# Patient Record
Sex: Female | Born: 1945
Health system: Southern US, Community
[De-identification: ages and names within clinical notes are randomized; demographics above are authoritative.]

## PROBLEM LIST (undated history)

## (undated) DIAGNOSIS — F329 Major depressive disorder, single episode, unspecified: Secondary | ICD-10-CM

## (undated) DIAGNOSIS — G4730 Sleep apnea, unspecified: Secondary | ICD-10-CM

## (undated) DIAGNOSIS — R011 Cardiac murmur, unspecified: Secondary | ICD-10-CM

## (undated) DIAGNOSIS — K219 Gastro-esophageal reflux disease without esophagitis: Secondary | ICD-10-CM

## (undated) DIAGNOSIS — Z9889 Other specified postprocedural states: Secondary | ICD-10-CM

## (undated) DIAGNOSIS — H409 Unspecified glaucoma: Secondary | ICD-10-CM

## (undated) DIAGNOSIS — R51 Headache: Secondary | ICD-10-CM

## (undated) DIAGNOSIS — D8989 Other specified disorders involving the immune mechanism, not elsewhere classified: Secondary | ICD-10-CM

## (undated) DIAGNOSIS — D649 Anemia, unspecified: Secondary | ICD-10-CM

## (undated) DIAGNOSIS — R112 Nausea with vomiting, unspecified: Secondary | ICD-10-CM

## (undated) DIAGNOSIS — C50919 Malignant neoplasm of unspecified site of unspecified female breast: Secondary | ICD-10-CM

## (undated) DIAGNOSIS — E7800 Pure hypercholesterolemia, unspecified: Secondary | ICD-10-CM

## (undated) DIAGNOSIS — I639 Cerebral infarction, unspecified: Secondary | ICD-10-CM

## (undated) DIAGNOSIS — I10 Essential (primary) hypertension: Secondary | ICD-10-CM

## (undated) DIAGNOSIS — M797 Fibromyalgia: Secondary | ICD-10-CM

## (undated) DIAGNOSIS — K7581 Nonalcoholic steatohepatitis (NASH): Secondary | ICD-10-CM

## (undated) DIAGNOSIS — I341 Nonrheumatic mitral (valve) prolapse: Secondary | ICD-10-CM

## (undated) DIAGNOSIS — R0600 Dyspnea, unspecified: Secondary | ICD-10-CM

## (undated) DIAGNOSIS — C37 Malignant neoplasm of thymus: Secondary | ICD-10-CM

## (undated) DIAGNOSIS — R519 Headache, unspecified: Secondary | ICD-10-CM

## (undated) DIAGNOSIS — J189 Pneumonia, unspecified organism: Secondary | ICD-10-CM

## (undated) DIAGNOSIS — K589 Irritable bowel syndrome without diarrhea: Secondary | ICD-10-CM

## (undated) DIAGNOSIS — F419 Anxiety disorder, unspecified: Secondary | ICD-10-CM

## (undated) DIAGNOSIS — M1990 Unspecified osteoarthritis, unspecified site: Secondary | ICD-10-CM

## (undated) DIAGNOSIS — C50912 Malignant neoplasm of unspecified site of left female breast: Secondary | ICD-10-CM

## (undated) DIAGNOSIS — F32A Depression, unspecified: Secondary | ICD-10-CM

## (undated) HISTORY — PX: BREAST LUMPECTOMY: SHX2

## (undated) HISTORY — DX: Malignant neoplasm of unspecified site of unspecified female breast: C50.919

## (undated) HISTORY — PX: MASTECTOMY: SHX3

## (undated) HISTORY — PX: BREAST BIOPSY: SHX20

---
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
## 1978-10-20 HISTORY — PX: ABDOMINAL HYSTERECTOMY: SHX81

## 2009-11-16 ENCOUNTER — Encounter: Payer: Self-pay | Admitting: Physician Assistant

## 2012-01-14 DIAGNOSIS — H409 Unspecified glaucoma: Secondary | ICD-10-CM | POA: Diagnosis not present

## 2012-01-14 DIAGNOSIS — H4011X0 Primary open-angle glaucoma, stage unspecified: Secondary | ICD-10-CM | POA: Diagnosis not present

## 2012-03-10 ENCOUNTER — Encounter: Payer: Self-pay | Admitting: Physician Assistant

## 2012-03-10 DIAGNOSIS — M359 Systemic involvement of connective tissue, unspecified: Secondary | ICD-10-CM | POA: Diagnosis not present

## 2012-03-10 DIAGNOSIS — M6530 Trigger finger, unspecified finger: Secondary | ICD-10-CM | POA: Diagnosis not present

## 2012-03-10 DIAGNOSIS — M899 Disorder of bone, unspecified: Secondary | ICD-10-CM | POA: Diagnosis not present

## 2012-03-10 DIAGNOSIS — M25549 Pain in joints of unspecified hand: Secondary | ICD-10-CM | POA: Diagnosis not present

## 2012-03-10 DIAGNOSIS — IMO0001 Reserved for inherently not codable concepts without codable children: Secondary | ICD-10-CM | POA: Diagnosis not present

## 2012-07-08 DIAGNOSIS — Z23 Encounter for immunization: Secondary | ICD-10-CM | POA: Diagnosis not present

## 2012-09-08 DIAGNOSIS — M359 Systemic involvement of connective tissue, unspecified: Secondary | ICD-10-CM | POA: Diagnosis not present

## 2012-09-08 DIAGNOSIS — M159 Polyosteoarthritis, unspecified: Secondary | ICD-10-CM | POA: Diagnosis not present

## 2012-09-08 DIAGNOSIS — M25549 Pain in joints of unspecified hand: Secondary | ICD-10-CM | POA: Diagnosis not present

## 2012-09-08 DIAGNOSIS — M659 Synovitis and tenosynovitis, unspecified: Secondary | ICD-10-CM | POA: Diagnosis not present

## 2012-11-03 DIAGNOSIS — E559 Vitamin D deficiency, unspecified: Secondary | ICD-10-CM | POA: Insufficient documentation

## 2012-11-03 DIAGNOSIS — R7989 Other specified abnormal findings of blood chemistry: Secondary | ICD-10-CM | POA: Diagnosis not present

## 2012-11-03 DIAGNOSIS — K5289 Other specified noninfective gastroenteritis and colitis: Secondary | ICD-10-CM | POA: Diagnosis not present

## 2012-11-03 DIAGNOSIS — K589 Irritable bowel syndrome without diarrhea: Secondary | ICD-10-CM | POA: Insufficient documentation

## 2012-11-03 DIAGNOSIS — C50019 Malignant neoplasm of nipple and areola, unspecified female breast: Secondary | ICD-10-CM | POA: Insufficient documentation

## 2012-11-03 DIAGNOSIS — E7800 Pure hypercholesterolemia, unspecified: Secondary | ICD-10-CM | POA: Diagnosis not present

## 2012-11-03 DIAGNOSIS — F32A Depression, unspecified: Secondary | ICD-10-CM | POA: Insufficient documentation

## 2012-11-03 DIAGNOSIS — K52832 Lymphocytic colitis: Secondary | ICD-10-CM | POA: Insufficient documentation

## 2012-11-03 DIAGNOSIS — M797 Fibromyalgia: Secondary | ICD-10-CM | POA: Insufficient documentation

## 2012-11-03 DIAGNOSIS — M1990 Unspecified osteoarthritis, unspecified site: Secondary | ICD-10-CM | POA: Insufficient documentation

## 2012-11-03 DIAGNOSIS — F329 Major depressive disorder, single episode, unspecified: Secondary | ICD-10-CM | POA: Insufficient documentation

## 2012-11-03 DIAGNOSIS — I10 Essential (primary) hypertension: Secondary | ICD-10-CM | POA: Diagnosis not present

## 2012-11-24 DIAGNOSIS — E785 Hyperlipidemia, unspecified: Secondary | ICD-10-CM | POA: Diagnosis not present

## 2012-11-24 DIAGNOSIS — K7689 Other specified diseases of liver: Secondary | ICD-10-CM | POA: Diagnosis not present

## 2012-11-24 DIAGNOSIS — R7989 Other specified abnormal findings of blood chemistry: Secondary | ICD-10-CM | POA: Diagnosis not present

## 2013-03-22 DIAGNOSIS — E785 Hyperlipidemia, unspecified: Secondary | ICD-10-CM | POA: Diagnosis not present

## 2013-03-22 DIAGNOSIS — R7989 Other specified abnormal findings of blood chemistry: Secondary | ICD-10-CM | POA: Diagnosis not present

## 2013-03-23 DIAGNOSIS — H25099 Other age-related incipient cataract, unspecified eye: Secondary | ICD-10-CM | POA: Diagnosis not present

## 2013-03-23 DIAGNOSIS — H4011X0 Primary open-angle glaucoma, stage unspecified: Secondary | ICD-10-CM | POA: Diagnosis not present

## 2013-03-23 DIAGNOSIS — H409 Unspecified glaucoma: Secondary | ICD-10-CM | POA: Diagnosis not present

## 2013-03-31 DIAGNOSIS — Z01419 Encounter for gynecological examination (general) (routine) without abnormal findings: Secondary | ICD-10-CM | POA: Diagnosis not present

## 2013-03-31 DIAGNOSIS — R928 Other abnormal and inconclusive findings on diagnostic imaging of breast: Secondary | ICD-10-CM | POA: Diagnosis not present

## 2013-03-31 DIAGNOSIS — Z9010 Acquired absence of unspecified breast and nipple: Secondary | ICD-10-CM | POA: Diagnosis not present

## 2013-07-27 DIAGNOSIS — Z23 Encounter for immunization: Secondary | ICD-10-CM | POA: Diagnosis not present

## 2013-12-27 DIAGNOSIS — F419 Anxiety disorder, unspecified: Secondary | ICD-10-CM | POA: Insufficient documentation

## 2013-12-27 DIAGNOSIS — E7800 Pure hypercholesterolemia, unspecified: Secondary | ICD-10-CM | POA: Diagnosis not present

## 2013-12-27 DIAGNOSIS — I10 Essential (primary) hypertension: Secondary | ICD-10-CM | POA: Diagnosis present

## 2013-12-27 DIAGNOSIS — E559 Vitamin D deficiency, unspecified: Secondary | ICD-10-CM | POA: Diagnosis not present

## 2013-12-27 DIAGNOSIS — F411 Generalized anxiety disorder: Secondary | ICD-10-CM | POA: Diagnosis not present

## 2013-12-27 DIAGNOSIS — IMO0001 Reserved for inherently not codable concepts without codable children: Secondary | ICD-10-CM | POA: Diagnosis not present

## 2014-08-10 DIAGNOSIS — Z23 Encounter for immunization: Secondary | ICD-10-CM | POA: Diagnosis not present

## 2014-11-29 ENCOUNTER — Encounter (HOSPITAL_COMMUNITY): Payer: Self-pay

## 2014-11-29 ENCOUNTER — Inpatient Hospital Stay (HOSPITAL_COMMUNITY): Payer: Medicare Other

## 2014-11-29 ENCOUNTER — Emergency Department (HOSPITAL_COMMUNITY): Payer: Medicare Other

## 2014-11-29 ENCOUNTER — Other Ambulatory Visit: Payer: Self-pay

## 2014-11-29 ENCOUNTER — Observation Stay (HOSPITAL_COMMUNITY)
Admission: EM | Admit: 2014-11-29 | Discharge: 2014-12-02 | Disposition: A | Discharge status: Home / Self Care | Payer: Medicare Other | Attending: Internal Medicine | Admitting: Internal Medicine

## 2014-11-29 DIAGNOSIS — F419 Anxiety disorder, unspecified: Secondary | ICD-10-CM | POA: Insufficient documentation

## 2014-11-29 DIAGNOSIS — E876 Hypokalemia: Secondary | ICD-10-CM | POA: Diagnosis present

## 2014-11-29 DIAGNOSIS — R0789 Other chest pain: Secondary | ICD-10-CM | POA: Diagnosis not present

## 2014-11-29 DIAGNOSIS — G319 Degenerative disease of nervous system, unspecified: Secondary | ICD-10-CM | POA: Diagnosis not present

## 2014-11-29 DIAGNOSIS — R51 Headache: Secondary | ICD-10-CM | POA: Diagnosis not present

## 2014-11-29 DIAGNOSIS — K219 Gastro-esophageal reflux disease without esophagitis: Secondary | ICD-10-CM

## 2014-11-29 DIAGNOSIS — R110 Nausea: Secondary | ICD-10-CM | POA: Diagnosis not present

## 2014-11-29 DIAGNOSIS — R109 Unspecified abdominal pain: Secondary | ICD-10-CM

## 2014-11-29 DIAGNOSIS — Z79899 Other long term (current) drug therapy: Secondary | ICD-10-CM | POA: Diagnosis not present

## 2014-11-29 DIAGNOSIS — R4781 Slurred speech: Secondary | ICD-10-CM | POA: Diagnosis not present

## 2014-11-29 DIAGNOSIS — K760 Fatty (change of) liver, not elsewhere classified: Secondary | ICD-10-CM | POA: Diagnosis not present

## 2014-11-29 DIAGNOSIS — E785 Hyperlipidemia, unspecified: Secondary | ICD-10-CM | POA: Diagnosis not present

## 2014-11-29 DIAGNOSIS — K589 Irritable bowel syndrome without diarrhea: Secondary | ICD-10-CM | POA: Insufficient documentation

## 2014-11-29 DIAGNOSIS — Z853 Personal history of malignant neoplasm of breast: Secondary | ICD-10-CM | POA: Diagnosis not present

## 2014-11-29 DIAGNOSIS — R079 Chest pain, unspecified: Secondary | ICD-10-CM | POA: Diagnosis present

## 2014-11-29 DIAGNOSIS — M797 Fibromyalgia: Secondary | ICD-10-CM | POA: Insufficient documentation

## 2014-11-29 DIAGNOSIS — I6782 Cerebral ischemia: Secondary | ICD-10-CM | POA: Diagnosis not present

## 2014-11-29 DIAGNOSIS — E780 Pure hypercholesterolemia: Secondary | ICD-10-CM | POA: Insufficient documentation

## 2014-11-29 DIAGNOSIS — F329 Major depressive disorder, single episode, unspecified: Secondary | ICD-10-CM | POA: Diagnosis not present

## 2014-11-29 DIAGNOSIS — I341 Nonrheumatic mitral (valve) prolapse: Secondary | ICD-10-CM | POA: Insufficient documentation

## 2014-11-29 DIAGNOSIS — I10 Essential (primary) hypertension: Secondary | ICD-10-CM | POA: Diagnosis present

## 2014-11-29 DIAGNOSIS — H409 Unspecified glaucoma: Secondary | ICD-10-CM | POA: Diagnosis not present

## 2014-11-29 DIAGNOSIS — R0902 Hypoxemia: Secondary | ICD-10-CM | POA: Diagnosis not present

## 2014-11-29 DIAGNOSIS — G4440 Drug-induced headache, not elsewhere classified, not intractable: Secondary | ICD-10-CM | POA: Diagnosis present

## 2014-11-29 DIAGNOSIS — Z7982 Long term (current) use of aspirin: Secondary | ICD-10-CM | POA: Diagnosis not present

## 2014-11-29 DIAGNOSIS — R100 Acute abdomen: Secondary | ICD-10-CM | POA: Diagnosis not present

## 2014-11-29 DIAGNOSIS — J984 Other disorders of lung: Secondary | ICD-10-CM | POA: Diagnosis not present

## 2014-11-29 HISTORY — DX: Anxiety disorder, unspecified: F41.9

## 2014-11-29 HISTORY — DX: Essential (primary) hypertension: I10

## 2014-11-29 HISTORY — DX: Pure hypercholesterolemia, unspecified: E78.00

## 2014-11-29 HISTORY — DX: Nausea with vomiting, unspecified: Z98.890

## 2014-11-29 HISTORY — DX: Irritable bowel syndrome, unspecified: K58.9

## 2014-11-29 HISTORY — DX: Headache, unspecified: R51.9

## 2014-11-29 HISTORY — DX: Other specified postprocedural states: R11.2

## 2014-11-29 HISTORY — DX: Unspecified osteoarthritis, unspecified site: M19.90

## 2014-11-29 HISTORY — DX: Nonrheumatic mitral (valve) prolapse: I34.1

## 2014-11-29 HISTORY — DX: Headache: R51

## 2014-11-29 HISTORY — DX: Depression, unspecified: F32.A

## 2014-11-29 HISTORY — DX: Other specified disorders involving the immune mechanism, not elsewhere classified: D89.89

## 2014-11-29 HISTORY — DX: Unspecified glaucoma: H40.9

## 2014-11-29 HISTORY — DX: Malignant neoplasm of unspecified site of left female breast: C50.912

## 2014-11-29 HISTORY — DX: Nonalcoholic steatohepatitis (NASH): K75.81

## 2014-11-29 HISTORY — DX: Gastro-esophageal reflux disease without esophagitis: K21.9

## 2014-11-29 HISTORY — DX: Fibromyalgia: M79.7

## 2014-11-29 HISTORY — DX: Major depressive disorder, single episode, unspecified: F32.9

## 2014-11-29 LAB — CBC
HEMATOCRIT: 39.1 % (ref 36.0–46.0)
Hemoglobin: 14 g/dL (ref 12.0–15.0)
MCH: 31 pg (ref 26.0–34.0)
MCHC: 35.8 g/dL (ref 30.0–36.0)
MCV: 86.7 fL (ref 78.0–100.0)
PLATELETS: 266 10*3/uL (ref 150–400)
RBC: 4.51 MIL/uL (ref 3.87–5.11)
RDW: 12.4 % (ref 11.5–15.5)
WBC: 7 10*3/uL (ref 4.0–10.5)

## 2014-11-29 LAB — BASIC METABOLIC PANEL
Anion gap: 13 (ref 5–15)
BUN: 12 mg/dL (ref 6–23)
CO2: 28 mmol/L (ref 19–32)
Calcium: 9.6 mg/dL (ref 8.4–10.5)
Chloride: 95 mmol/L — ABNORMAL LOW (ref 96–112)
Creatinine, Ser: 0.86 mg/dL (ref 0.50–1.10)
GFR calc Af Amer: 78 mL/min — ABNORMAL LOW (ref >90)
GFR calc non Af Amer: 67 mL/min — ABNORMAL LOW (ref >90)
GLUCOSE: 125 mg/dL — AB (ref 70–99)
POTASSIUM: 2.8 mmol/L — AB (ref 3.5–5.1)
Sodium: 136 mmol/L (ref 135–145)

## 2014-11-29 LAB — LIPASE, BLOOD: LIPASE: 26 U/L (ref 11–59)

## 2014-11-29 LAB — I-STAT TROPONIN, ED: Troponin i, poc: 0 ng/mL (ref 0.00–0.08)

## 2014-11-29 LAB — HEPATIC FUNCTION PANEL
ALT: 50 U/L — AB (ref 0–35)
AST: 45 U/L — AB (ref 0–37)
Albumin: 4.1 g/dL (ref 3.5–5.2)
Alkaline Phosphatase: 120 U/L — ABNORMAL HIGH (ref 39–117)
BILIRUBIN TOTAL: 0.7 mg/dL (ref 0.3–1.2)
Bilirubin, Direct: 0.1 mg/dL (ref 0.0–0.5)
Indirect Bilirubin: 0.6 mg/dL (ref 0.3–0.9)
Total Protein: 7.1 g/dL (ref 6.0–8.3)

## 2014-11-29 LAB — SEDIMENTATION RATE: Sed Rate: 16 mm/hr (ref 0–22)

## 2014-11-29 LAB — D-DIMER, QUANTITATIVE: D-Dimer, Quant: 0.35 ug/mL-FEU (ref 0.00–0.48)

## 2014-11-29 LAB — MRSA PCR SCREENING: MRSA BY PCR: NEGATIVE

## 2014-11-29 MED ORDER — GI COCKTAIL ~~LOC~~
30.0000 mL | Freq: Once | ORAL | Status: AC
  Administered 2014-11-29: 30 mL via ORAL
  Filled 2014-11-29: qty 30

## 2014-11-29 MED ORDER — ASPIRIN EC 325 MG PO TBEC
325.0000 mg | DELAYED_RELEASE_TABLET | Freq: Every day | ORAL | Status: DC
  Administered 2014-11-30 – 2014-12-02 (×3): 325 mg via ORAL
  Filled 2014-11-29 (×3): qty 1

## 2014-11-29 MED ORDER — POTASSIUM CHLORIDE CRYS ER 20 MEQ PO TBCR
40.0000 meq | EXTENDED_RELEASE_TABLET | Freq: Once | ORAL | Status: AC
  Administered 2014-11-29: 40 meq via ORAL
  Filled 2014-11-29: qty 2

## 2014-11-29 MED ORDER — MORPHINE SULFATE 2 MG/ML IJ SOLN: 2.0000 mg | INTRAMUSCULAR | Status: DC | PRN

## 2014-11-29 MED ORDER — ENOXAPARIN SODIUM 30 MG/0.3ML ~~LOC~~ SOLN
30.0000 mg | Freq: Every day | SUBCUTANEOUS | Status: DC
  Administered 2014-11-29: 30 mg via SUBCUTANEOUS
  Filled 2014-11-29 (×2): qty 0.3

## 2014-11-29 MED ORDER — PROCHLORPERAZINE EDISYLATE 5 MG/ML IJ SOLN
10.0000 mg | Freq: Once | INTRAMUSCULAR | Status: AC
  Administered 2014-11-29: 10 mg via INTRAVENOUS
  Filled 2014-11-29: qty 2

## 2014-11-29 MED ORDER — DEXAMETHASONE SODIUM PHOSPHATE 10 MG/ML IJ SOLN
10.0000 mg | Freq: Once | INTRAMUSCULAR | Status: AC
  Administered 2014-11-29: 10 mg via INTRAVENOUS
  Filled 2014-11-29: qty 1

## 2014-11-29 MED ORDER — PAROXETINE HCL 30 MG PO TABS
60.0000 mg | ORAL_TABLET | Freq: Every day | ORAL | Status: DC
  Administered 2014-11-29 – 2014-12-01 (×3): 60 mg via ORAL
  Filled 2014-11-29 (×4): qty 2

## 2014-11-29 MED ORDER — DIPHENHYDRAMINE HCL 50 MG/ML IJ SOLN
12.5000 mg | Freq: Once | INTRAMUSCULAR | Status: AC
  Administered 2014-11-29: 12.5 mg via INTRAVENOUS
  Filled 2014-11-29: qty 1

## 2014-11-29 MED ORDER — NITROGLYCERIN 0.4 MG SL SUBL
0.4000 mg | SUBLINGUAL_TABLET | SUBLINGUAL | Status: DC | PRN
  Filled 2014-11-29: qty 1

## 2014-11-29 MED ORDER — ACETAMINOPHEN 325 MG PO TABS
650.0000 mg | ORAL_TABLET | ORAL | Status: DC | PRN
  Administered 2014-11-30: 650 mg via ORAL
  Filled 2014-11-29: qty 2

## 2014-11-29 MED ORDER — ONDANSETRON HCL 4 MG/2ML IJ SOLN: 4.0000 mg | Freq: Four times a day (QID) | INTRAMUSCULAR | Status: DC | PRN

## 2014-11-29 MED ORDER — CYCLOBENZAPRINE HCL 10 MG PO TABS
10.0000 mg | ORAL_TABLET | Freq: Three times a day (TID) | ORAL | Status: DC | PRN
  Administered 2014-12-01: 18:00:00 10 mg via ORAL
  Filled 2014-11-29: qty 1

## 2014-11-29 MED ORDER — MAGNESIUM SULFATE 2 GM/50ML IV SOLN
2.0000 g | Freq: Once | INTRAVENOUS | Status: AC
  Administered 2014-11-29: 2 g via INTRAVENOUS
  Filled 2014-11-29: qty 50

## 2014-11-29 MED ORDER — SODIUM CHLORIDE 0.9 % IV BOLUS (SEPSIS)
1000.0000 mL | Freq: Once | INTRAVENOUS | Status: AC
  Administered 2014-11-29: 1000 mL via INTRAVENOUS

## 2014-11-29 MED ORDER — ATORVASTATIN CALCIUM 40 MG PO TABS
40.0000 mg | ORAL_TABLET | Freq: Every day | ORAL | Status: DC
  Administered 2014-11-29 – 2014-12-01 (×3): 40 mg via ORAL
  Filled 2014-11-29 (×4): qty 1

## 2014-11-29 MED ORDER — TRAMADOL HCL 50 MG PO TABS
50.0000 mg | ORAL_TABLET | ORAL | Status: DC | PRN
  Administered 2014-11-30 – 2014-12-01 (×5): 50 mg via ORAL
  Filled 2014-11-29 (×5): qty 1

## 2014-11-29 MED ORDER — ASPIRIN 81 MG PO CHEW
324.0000 mg | CHEWABLE_TABLET | Freq: Once | ORAL | Status: AC
  Administered 2014-11-29: 324 mg via ORAL
  Filled 2014-11-29: qty 4

## 2014-11-29 MED ORDER — POTASSIUM CHLORIDE IN NACL 20-0.9 MEQ/L-% IV SOLN
INTRAVENOUS | Status: AC
  Administered 2014-11-29 – 2014-11-30 (×2): via INTRAVENOUS
  Filled 2014-11-29 (×2): qty 1000

## 2014-11-29 MED ORDER — HYDRALAZINE HCL 20 MG/ML IJ SOLN: 10.0000 mg | INTRAMUSCULAR | Status: DC | PRN

## 2014-11-29 NOTE — ED Notes (Signed)
Pt will return to Ed after [place] for RN to take up to [facility]

## 2014-11-29 NOTE — ED Provider Notes (Signed)
Pt presents to the ED with complaints of headache, chest pain and htn.  Chest pain has been waxing and waning.  Moderate risk per heart score.  Family also mentions patient has been having some issues with her balance and speech.    Will plan on admission for further evaluation regarding ACS, stroke.  I saw and evaluated the patient, reviewed the resident's note and I agree with the findings and plan.   EKG Interpretation   Date/Time:  Wednesday November 29 2014 15:59:06 EST Ventricular Rate:  96 PR Interval:  150 QRS Duration: 92 QT Interval:  402 QTC Calculation: 507 R Axis:   57 Text Interpretation:  Normal sinus rhythm Incomplete right bundle branch  block Nonspecific ST abnormality Prolonged QT Abnormal ECG No previous  tracing Confirmed by Jawara Latorre  MD-J, Sheikh Leverich ([id number]) on 11/29/2014 4:28:39 PM         Dorie Rank, MD 11/29/14 1719

## 2014-11-29 NOTE — Progress Notes (Signed)
Patient just arrived to unit 6c @2230  received report @2010 .

## 2014-11-29 NOTE — ED Notes (Signed)
Pt's family member reporting pt having delayed responses lately.  Sts this is not the norm for the pt.

## 2014-11-29 NOTE — ED Provider Notes (Signed)
CSN: [id number]     Arrival date & time 11/29/14  1553 History   First MD Initiated Contact with Patient 11/29/14 1614     Chief Complaint  Patient presents with  . Chest Pain  . Headache  . Hypertension     (Consider location/radiation/quality/duration/timing/severity/associated sxs/prior Treatment) HPI Autumn Conley is a 69 y.o. female with PMH as below who presents  to ED with c/o HA, CP, hypertension.   Sxs started with HA 2 wks ago. Pt says she typically gets 2-3 tension HA weekly. Occasional migraine HAs. HA today has been constant for past 2 wks and is primarily frontal. Wakes up with HA and goes to bed with HA. OTC meds provide minimal relief. Pt also began having substernal chest pressure which has been constant for about a week, but waxes/wanes, never completely goes away. No modifying factors for either HA or CP. Pt has also been checking BP which has been up, into 007H systolic. Went to UC today and they rec'd go to ED. Pain is characterized as pressure.   Onset of symptoms: gradual.    Severity: 5/10.   Associated symptoms: Indigestion, nausea. Denies SOB, diaphoresis, leg pain, recent long trip, vomiting. No recent f/c, cough.   Hx of similar symptoms: no.     Past Medical History  Diagnosis Date  . Hypercholesteremia   . Hypertension   . Glaucoma   . IBS (irritable bowel syndrome)   . Cancer     breast cancer   Past Surgical History  Procedure Laterality Date  . Mastectomy    . Appendectomy    . Abdominal hysterectomy     History reviewed. No pertinent family history. History  Substance Use Topics  . Smoking status: Never Smoker   . Smokeless tobacco: Not on file  . Alcohol Use: No   OB History    No data available     Review of Systems  Constitutional: Negative for fever and chills.  HENT: Negative for congestion, rhinorrhea and sore throat.   Eyes: Negative for visual disturbance.  Respiratory: Negative for cough and shortness of breath.    Cardiovascular: Positive for chest pain. Negative for palpitations and leg swelling.  Gastrointestinal: Positive for nausea. Negative for vomiting, abdominal pain, diarrhea and constipation.  Genitourinary: Negative for dysuria, hematuria, vaginal bleeding and vaginal discharge.  Musculoskeletal: Negative for back pain and neck pain.  Skin: Negative for rash.  Neurological: Positive for headaches. Negative for weakness.  All other systems reviewed and are negative.    Allergies  Review of patient's allergies indicates not on file.  Home Medications   Prior to Admission medications   Not on File   BP 181/99 mmHg  Pulse 92  Temp(Src) 97.9 F (36.6 C) (Oral)  Resp 20  SpO2 95% Physical Exam  Constitutional: She is oriented to person, place, and time. She appears well-developed and well-nourished. No distress.  HENT:  Head: Normocephalic and atraumatic.  Eyes: Conjunctivae are normal.  Neck: Normal range of motion.  Cardiovascular: Normal rate, regular rhythm, normal heart sounds and intact distal pulses.   No murmur heard. Pulmonary/Chest: Effort normal and breath sounds normal. No respiratory distress. She has no wheezes. She has no rales. She exhibits no tenderness.  Abdominal: Soft. Bowel sounds are normal. She exhibits no distension and no mass. There is no tenderness. There is no rebound and no guarding.  Musculoskeletal: Normal range of motion. She exhibits no edema (BL lower extremity) or tenderness.  Neurological: She is alert and  oriented to person, place, and time. No cranial nerve deficit.  Skin: Skin is warm and dry. She is not diaphoretic.  Psychiatric: She has a normal mood and affect.  Nursing note and vitals reviewed.   ED Course  Procedures (including critical care time) Labs Review Labs Reviewed  BASIC METABOLIC PANEL - Abnormal; Notable for the following:    Potassium 2.8 (*)    Chloride 95 (*)    Glucose, Bld 125 (*)    GFR calc non Af Amer 67 (*)     GFR calc Af Amer 78 (*)    All other components within normal limits  CBC  I-STAT TROPOININ, ED    Imaging Review Ct Head Wo Contrast  11/29/2014   CLINICAL DATA:  Headaches for 1 week. Nausea and chest pain. No injury.  EXAM: CT HEAD WITHOUT CONTRAST  TECHNIQUE: Contiguous axial images were obtained from the base of the skull through the vertex without intravenous contrast.  COMPARISON:  None.  FINDINGS: Diffuse cerebral atrophy. Patchy low-attenuation changes in the deep white matter consistent with small vessel ischemia. No mass effect or midline shift. No abnormal extra-axial fluid collections. Gray-white matter junctions are distinct. Basal cisterns are not effaced. No evidence of acute intracranial hemorrhage. No depressed skull fractures. Small retention cyst in the left maxillary antrum. Mastoid air cells are not opacified.  IMPRESSION: No acute intracranial abnormalities. Mild chronic atrophy and small vessel ischemic changes.   Electronically Signed   By: Lucienne Capers M.D.   On: 11/29/2014 18:31   US Abdomen Complete  11/29/2014   CLINICAL DATA:  Acute onset of generalized abdominal pain. Initial encounter.  EXAM: ULTRASOUND ABDOMEN COMPLETE  COMPARISON:  None.  FINDINGS: Gallbladder: No gallstones or wall thickening visualized. No sonographic Murphy sign noted.  Common bile duct: Diameter: 0.5 cm, within normal limits in caliber.  Liver: No focal lesion identified. Mildly increased parenchymal echogenicity and coarsened echotexture, likely reflecting fatty infiltration.  IVC: No abnormality visualized.  Pancreas: Not visualized due to overlying bowel gas.  Spleen: Size and appearance within normal limits.  Right Kidney: Length: 11.5 cm. Echogenicity within normal limits. No mass or hydronephrosis visualized.  Left Kidney: Length: 11.5 cm. Echogenicity within normal limits. No mass or hydronephrosis visualized.  Abdominal aorta: No aneurysm visualized.  Other findings: None.  IMPRESSION:  1. No acute abnormality seen within the abdomen. Pancreas not visualized due to overlying bowel gas. 2. Fatty infiltration within the liver.   Electronically Signed   By: Garald Balding M.D.   On: 11/29/2014 22:19   Dg Chest Port 1 View  11/29/2014   CLINICAL DATA:  Mid chest pain radiating to right jaw and arm, headache and hypertension.  EXAM: PORTABLE CHEST - 1 VIEW  COMPARISON:  None.  FINDINGS: The heart size and mediastinal contours are within normal limits. Minimal scarring/ atelectasis at both lung bases. There is no evidence of pulmonary edema, consolidation, pneumothorax, nodule or pleural fluid. The visualized skeletal structures are unremarkable.  IMPRESSION: No active disease.   Electronically Signed   By: Aletta Edouard M.D.   On: 11/29/2014 18:03     EKG Interpretation   Date/Time:  Wednesday November 29 2014 15:59:06 EST Ventricular Rate:  96 PR Interval:  150 QRS Duration: 92 QT Interval:  402 QTC Calculation: 507 R Axis:   57 Text Interpretation:  Normal sinus rhythm Incomplete right bundle branch  block Nonspecific ST abnormality Prolonged QT Abnormal ECG No previous  tracing Confirmed by KNAPP  MD-J,  JON [phone]) on 11/29/2014 4:28:39 PM      MDM   Final diagnoses:  None    Hadyn Azer is a 69 y.o. female who p/w CP. ABCs intact. HDS, NAD. Cardiac w/u initiated. ASA given in ED.  EKG personally review by myself and showed: NSR, incomplete RBBB. Slight ST dep in lateral leads. No STE.  HEART score 4. Previous w/u: none  Labs notable for undetectable initial Tn  Pain does not radiate to back, blood pressure is stable. No mediastinal widening on CXR. Dissection unlikely. Pt's breath sounds are equal bilaterally. No PTX on CXR. PTX unlikely. Pain is not associated with meals. GERD unlikely. Pt has Wells score of 0; PE unlikely. Pain is not positional and EKG is WNL. Pericarditis unlikely. No h/o cocaine use. Pt reports no cough or fever. No infiltrate on CXR. PNA  unlikely. No Hx of exertion, trauma. Pain is non-reproducible. Costochondritis unlikely.  This pt is in nad, afvss, non-toxic appearing.  HA onset was slow, not quick or thunderclap, doubt ich.  Pt has no focal neuro sx, neuro exam is wnl, no visual disturbance, no dizziness/lightheadedness and HA is described as typical HA, doubt intracranial abnormality (aneurysm or mass) and vertebral artery or carotid artery dissection.  No infectious sx, no meningismus, afebrile, no ams, doubt meningitis.  No sinus ttp, doubt sinusitis.  There is nothing on hx or exam to give me c/f dental or ear etiology.  No tenderness over temporal artery, doubt temporal arteritis.  No tearing or eye pain, doubt cluster HA.  Nothing in hx to concern me for CO poisoning.  No hyperesthesia or rash to concern me for zoster.  The pt received the following treatments:  Medications  aspirin chewable tablet 324 mg (324 mg Oral Given 11/29/14 1632)  sodium chloride 0.9 % bolus 1,000 mL (1,000 mLs Intravenous New Bag/Given 11/29/14 1651)  prochlorperazine (COMPAZINE) injection 10 mg (10 mg Intravenous Given 11/29/14 1648)  diphenhydrAMINE (BENADRYL) injection 12.5 mg (12.5 mg Intravenous Given 11/29/14 1648)  dexamethasone (DECADRON) injection 10 mg (10 mg Intravenous Given 11/29/14 1647)   HCT neg.  Following treatment pt's symptoms improved  Will admit to hospitalist for further mgmt.  Pt seen in conjunction with Dr. Alba Destine, [place] [facility] Resident - PGY-2     Kirstie Peri, MD 11/30/14 3888  Dorie Rank, MD 11/30/14 Joen Laura

## 2014-11-29 NOTE — ED Notes (Signed)
Pt with chest pain and headache that has been present for about a week.  Pain radiates to neck and jaw.  Pt was seen at [facility] for evaluation of symptoms and told to come to this ED.  Pt takes HCTZ at home.  Pt hypertensive at [facility] 170/110; at home it was 180/99. Pt also reported abdominal pain.

## 2014-11-29 NOTE — H&P (Signed)
[facility] History and Physical  Autumn Conley XBM:[id number] DOB: December 02, 1945 DOA: 11/29/2014  Referring physician: ER physician. PCP: No primary care provider on file.  Chief Complaint: Headache and chest pain.  HPI: Autumn Conley is a 69 y.o. female with history of hypertension, hyperlipidemia and chronic headaches presents to the ER because of headache and chest pain. Patient has been having chest pain over the last 1 week which has been persistent retrosternal nonradiating pressure-like squeezing type associated with some nausea denies any vomiting patient also has been having some epigastric discomfort. In addition patient has been having frontal headache more than usual for which usually patient takes tramadol. Denies any visual symptoms focal deficits walls of consciousness or any visual symptoms. CT head did not show anything acute. Patient states over the last few days patient's blood pressure has been running high and in the ER patient had systolic around 027O. Patient still has mild chest pain and will be admitted for further management. Patient's cardiac markers have been negative EKG shows minimal ST depression in the inferolateral leads.   Review of Systems: As presented in the history of presenting illness, rest negative.  Past Medical History  Diagnosis Date  . Hypercholesteremia   . Hypertension   . Glaucoma   . IBS (irritable bowel syndrome)   . Cancer     breast cancer   Past Surgical History  Procedure Laterality Date  . Mastectomy    . Appendectomy    . Abdominal hysterectomy     Social History:  reports that she has never smoked. She does not have any smokeless tobacco history on file. She reports that she does not drink alcohol. Her drug history is not on file. Where does patient live home. Can patient participate in ADLs? Yes.  No Known Allergies  Family History:  Family History  Problem Relation Age of Onset  . Family history unknown: Yes       Prior to Admission medications   Medication Sig Start Date End Date Taking? Authorizing Provider  aspirin EC 81 MG tablet Take 81 mg by mouth daily.   Yes Historical Provider, MD  Aspirin-Acetaminophen-Caffeine (GOODY HEADACHE PO) Take 1 packet by mouth daily as needed (headache / pain).   Yes Historical Provider, MD  atorvastatin (LIPITOR) 40 MG tablet Take 40 mg by mouth at bedtime.   Yes Historical Provider, MD  Cholecalciferol (DIALYVITE VITAMIN D 5000 PO) Take 1 capsule by mouth daily.   Yes Historical Provider, MD  cyclobenzaprine (FLEXERIL) 10 MG tablet Take 10 mg by mouth 3 (three) times daily as needed for muscle spasms.   Yes Historical Provider, MD  hydrochlorothiazide (HYDRODIURIL) 25 MG tablet Take 12.5 mg by mouth daily.   Yes Historical Provider, MD  PARoxetine (PAXIL) 20 MG tablet Take 60 mg by mouth at bedtime.   Yes Historical Provider, MD  traMADol (ULTRAM) 50 MG tablet Take 50 mg by mouth every 4 (four) hours as needed for moderate pain.   Yes Historical Provider, MD  vitamin E 100 UNIT capsule Take 400 Units by mouth daily.   Yes Historical Provider, MD    Physical Exam: Filed Vitals:   11/29/14 1915 11/29/14 1930 11/29/14 2000 11/29/14 2118  BP: 155/90 161/90  152/93  Pulse: 89 93  90  Temp:    98.1 F (36.7 C)  TempSrc:    Oral  Resp: 18 16  15   Height:   5' 4"  (1.626 m)   Weight:   74.39 kg (164 lb)  SpO2: 94% 90%  93%     General:  Well-developed and nourished.  Eyes: Anicteric no pallor.  ENT: No discharge from the ears eyes nose or mouth.  Neck: No mass felt. No neck rigidity.  Cardiovascular: S1-S2 heard.  Respiratory: No rhonchi or crepitations.  Abdomen: Soft nontender bowel sounds present.  Skin: No rash.  Musculoskeletal: No edema.  Psychiatric: Appears normal.  Neurologic: Alert awake oriented to time place and person. Moves all extremities.  Labs on Admission:  Basic Metabolic Panel:  Recent Labs Lab 11/29/14 1608  NA 136   K 2.8*  CL 95*  CO2 28  GLUCOSE 125*  BUN 12  CREATININE 0.86  CALCIUM 9.6   Liver Function Tests: No results for input(s): AST, ALT, ALKPHOS, BILITOT, PROT, ALBUMIN in the last 168 hours. No results for input(s): LIPASE, AMYLASE in the last 168 hours. No results for input(s): AMMONIA in the last 168 hours. CBC:  Recent Labs Lab 11/29/14 1608  WBC 7.0  HGB 14.0  HCT 39.1  MCV 86.7  PLT 266   Cardiac Enzymes: No results for input(s): CKTOTAL, CKMB, CKMBINDEX, TROPONINI in the last 168 hours.  BNP (last 3 results) No results for input(s): BNP in the last 8760 hours.  ProBNP (last 3 results) No results for input(s): PROBNP in the last 8760 hours.  CBG: No results for input(s): GLUCAP in the last 168 hours.  Radiological Exams on Admission: Ct Head Wo Contrast  11/29/2014   CLINICAL DATA:  Headaches for 1 week. Nausea and chest pain. No injury.  EXAM: CT HEAD WITHOUT CONTRAST  TECHNIQUE: Contiguous axial images were obtained from the base of the skull through the vertex without intravenous contrast.  COMPARISON:  None.  FINDINGS: Diffuse cerebral atrophy. Patchy low-attenuation changes in the deep white matter consistent with small vessel ischemia. No mass effect or midline shift. No abnormal extra-axial fluid collections. Gray-white matter junctions are distinct. Basal cisterns are not effaced. No evidence of acute intracranial hemorrhage. No depressed skull fractures. Small retention cyst in the left maxillary antrum. Mastoid air cells are not opacified.  IMPRESSION: No acute intracranial abnormalities. Mild chronic atrophy and small vessel ischemic changes.   Electronically Signed   By: Lucienne Capers M.D.   On: 11/29/2014 18:31   Dg Chest Port 1 View  11/29/2014   CLINICAL DATA:  Mid chest pain radiating to right jaw and arm, headache and hypertension.  EXAM: PORTABLE CHEST - 1 VIEW  COMPARISON:  None.  FINDINGS: The heart size and mediastinal contours are within normal  limits. Minimal scarring/ atelectasis at both lung bases. There is no evidence of pulmonary edema, consolidation, pneumothorax, nodule or pleural fluid. The visualized skeletal structures are unremarkable.  IMPRESSION: No active disease.   Electronically Signed   By: Aletta Edouard M.D.   On: 11/29/2014 18:03    EKG: Independently reviewed. Normal sinus rhythm with minimal ST T changes in inferolateral leads.  Assessment/Plan Principal Problem:   Chest pain Active Problems:   Hypokalemia   Headache   Hypertension   Hyperlipidemia   1. Chest pain - given the history of hypertension hyperlipidemia we will cycle cardiac markers to rule out ACS. Patient does have some ST-T changes in the inferolateral leads. Check 2-D echo. Will keep patient nothing by mouth in a.m. in anticipation of cardiac procedures. Check d-dimer. Since patient also has been having some nonspecific abdominal discomfort we'll check LFTs and sonogram of the abdomen to rule out any gallbladder pathology. 2. Hypokalemia -  may be related to hydrochlorothiazide. Replace and recheck and check magnesium levels. 3. Hypertension - we will hold her hydrochlorothiazide for now due to hypokalemia and I have placed patient on when necessary IV hydralazine. Based on blood pressure trends we may need to add medications. 4. Headache - patient has chronic headaches and takes tramadol. Patient states the headache has worsened from usual. Patient is nonfocal and CT head is negative for anything acute. At this time we will check sedimentation rate and closely observe and keep patient on as needed pain medications. 5. Hyperlipidemia - continue present medications.   DVT Prophylaxis Lovenox.  Code Status: Full code.  Family Communication: Daughter at the bedside.  Disposition Plan: Admit for observation.    KAKRAKANDY,ARSHAD N. [facility] Pager [phone].  If 7PM-7AM, please contact night-coverage [email or web address] Password  [facility] 11/29/2014, 9:34 PM

## 2014-11-30 ENCOUNTER — Inpatient Hospital Stay (HOSPITAL_COMMUNITY): Payer: Medicare Other

## 2014-11-30 DIAGNOSIS — G4452 New daily persistent headache (NDPH): Secondary | ICD-10-CM | POA: Diagnosis not present

## 2014-11-30 DIAGNOSIS — I10 Essential (primary) hypertension: Secondary | ICD-10-CM | POA: Diagnosis not present

## 2014-11-30 DIAGNOSIS — E876 Hypokalemia: Secondary | ICD-10-CM | POA: Diagnosis not present

## 2014-11-30 DIAGNOSIS — G44209 Tension-type headache, unspecified, not intractable: Secondary | ICD-10-CM | POA: Diagnosis not present

## 2014-11-30 DIAGNOSIS — R51 Headache: Secondary | ICD-10-CM | POA: Diagnosis not present

## 2014-11-30 DIAGNOSIS — E785 Hyperlipidemia, unspecified: Secondary | ICD-10-CM | POA: Diagnosis not present

## 2014-11-30 DIAGNOSIS — R072 Precordial pain: Secondary | ICD-10-CM

## 2014-11-30 DIAGNOSIS — R079 Chest pain, unspecified: Secondary | ICD-10-CM

## 2014-11-30 LAB — CBC WITH DIFFERENTIAL/PLATELET
Basophils Absolute: 0 10*3/uL (ref 0.0–0.1)
Basophils Relative: 0 % (ref 0–1)
EOS PCT: 0 % (ref 0–5)
Eosinophils Absolute: 0 10*3/uL (ref 0.0–0.7)
HEMATOCRIT: 34.3 % — AB (ref 36.0–46.0)
HEMOGLOBIN: 12 g/dL (ref 12.0–15.0)
LYMPHS ABS: 0.9 10*3/uL (ref 0.7–4.0)
LYMPHS PCT: 12 % (ref 12–46)
MCH: 31 pg (ref 26.0–34.0)
MCHC: 35 g/dL (ref 30.0–36.0)
MCV: 88.6 fL (ref 78.0–100.0)
MONO ABS: 0.6 10*3/uL (ref 0.1–1.0)
MONOS PCT: 8 % (ref 3–12)
Neutro Abs: 5.7 10*3/uL (ref 1.7–7.7)
Neutrophils Relative %: 80 % — ABNORMAL HIGH (ref 43–77)
Platelets: 224 10*3/uL (ref 150–400)
RBC: 3.87 MIL/uL (ref 3.87–5.11)
RDW: 12.4 % (ref 11.5–15.5)
WBC: 7.2 10*3/uL (ref 4.0–10.5)

## 2014-11-30 LAB — COMPREHENSIVE METABOLIC PANEL
ALT: 42 U/L — ABNORMAL HIGH (ref 0–35)
AST: 32 U/L (ref 0–37)
Albumin: 3.6 g/dL (ref 3.5–5.2)
Alkaline Phosphatase: 96 U/L (ref 39–117)
Anion gap: 3 — ABNORMAL LOW (ref 5–15)
BUN: 14 mg/dL (ref 6–23)
CHLORIDE: 105 mmol/L (ref 96–112)
CO2: 29 mmol/L (ref 19–32)
Calcium: 8.9 mg/dL (ref 8.4–10.5)
Creatinine, Ser: 0.77 mg/dL (ref 0.50–1.10)
GFR, EST NON AFRICAN AMERICAN: 84 mL/min — AB (ref >90)
Glucose, Bld: 128 mg/dL — ABNORMAL HIGH (ref 70–99)
Potassium: 3.5 mmol/L (ref 3.5–5.1)
Sodium: 137 mmol/L (ref 135–145)
Total Bilirubin: 0.8 mg/dL (ref 0.3–1.2)
Total Protein: 6.8 g/dL (ref 6.0–8.3)

## 2014-11-30 LAB — TSH: TSH: 0.988 u[IU]/mL (ref 0.350–4.500)

## 2014-11-30 LAB — TROPONIN I: Troponin I: <0.03 ng/mL (ref <0.031)

## 2014-11-30 MED ORDER — TECHNETIUM TC 99M SESTAMIBI GENERIC - CARDIOLITE
10.0000 | Freq: Once | INTRAVENOUS | Status: AC | PRN
  Administered 2014-11-30: 10 via INTRAVENOUS

## 2014-11-30 MED ORDER — GI COCKTAIL ~~LOC~~
30.0000 mL | Freq: Three times a day (TID) | ORAL | Status: DC | PRN
  Filled 2014-11-30: qty 30

## 2014-11-30 MED ORDER — ENOXAPARIN SODIUM 40 MG/0.4ML ~~LOC~~ SOLN
40.0000 mg | Freq: Every day | SUBCUTANEOUS | Status: DC
  Administered 2014-11-30 – 2014-12-01 (×2): 40 mg via SUBCUTANEOUS
  Filled 2014-11-30 (×3): qty 0.4

## 2014-11-30 MED ORDER — PANTOPRAZOLE SODIUM 40 MG PO TBEC
40.0000 mg | DELAYED_RELEASE_TABLET | Freq: Every day | ORAL | Status: DC
  Administered 2014-11-30 – 2014-12-02 (×3): 40 mg via ORAL
  Filled 2014-11-30 (×4): qty 1

## 2014-11-30 MED ORDER — TECHNETIUM TC 99M SESTAMIBI GENERIC - CARDIOLITE
30.0000 | Freq: Once | INTRAVENOUS | Status: AC | PRN
  Administered 2014-11-30: 30 via INTRAVENOUS

## 2014-11-30 MED ORDER — LISINOPRIL 10 MG PO TABS
10.0000 mg | ORAL_TABLET | Freq: Every day | ORAL | Status: DC
  Administered 2014-11-30 – 2014-12-02 (×3): 10 mg via ORAL
  Filled 2014-11-30 (×3): qty 1

## 2014-11-30 MED ORDER — REGADENOSON 0.4 MG/5ML IV SOLN
INTRAVENOUS | Status: AC
  Administered 2014-11-30: 13:00:00 0.4 mg via INTRAVENOUS
  Filled 2014-11-30: qty 5

## 2014-11-30 MED ORDER — SODIUM CHLORIDE 0.9 % IJ SOLN
80.0000 mg | INTRAVENOUS | Status: AC
  Administered 2014-11-30: 80 mg via INTRAVENOUS

## 2014-11-30 MED ORDER — REGADENOSON 0.4 MG/5ML IV SOLN
0.4000 mg | Freq: Once | INTRAVENOUS | Status: AC
  Administered 2014-11-30: 0.4 mg via INTRAVENOUS
  Filled 2014-11-30: qty 5

## 2014-11-30 NOTE — Consult Note (Signed)
CARDIOLOGY CONSULT NOTE       Patient ID: Autumn Conley MRN: [id number] DOB/AGE: [birth year] 69 y.o.  Admit date: 11/29/2014 Referring Physician:  Short Primary Physician: No PCP Per Patient Primary Cardiologist:  New/ Trimaine Maser Reason for Consultation: Chest pain  Principal Problem:   Chest pain Active Problems:   Hypokalemia   Headache   Hypertension   Hyperlipidemia   HPI:   69 yo admitted with headache, chest pain and abdominal pain.  She lives near [place] and previously had primary in [place].  Husband has had CABG and after being seen at urgent care in [place] sent here She gets occasional headaches but over last week more severe and not relieved by tramadol, goodies or flexaril.  CT in ER negative.  Also has had abdominal pain.  Not localizing  Indicates history of irritable bowel syndrome No diarrhea or vomiting  No history of CAD  History of elevated lipids on statin and HTN on diuretic.  BP elevated on urgent care evaluation and now fine.  This am still with heaviness in chest and headache.  Initial ECG;s reviewed and non acute with negative troponin   ROS All other systems reviewed and negative except as noted above  Past Medical History  Diagnosis Date  . Hypercholesteremia   . Hypertension   . IBS (irritable bowel syndrome)   . Cancer of left breast   . PONV (postoperative nausea and vomiting)   . Mitral valve prolapse   . GERD (gastroesophageal reflux disease)   . Headache     "weekly" (11/29/2014)  . Arthritis     "joints" (11/29/2014)  . Osteoarthritis   . Autoimmune disorder     "non-specific"  . Anxiety   . Depression   . Fibromyalgia     "some; not chronic" (11/29/2014)  . Glaucoma of both eyes   . NASH (nonalcoholic steatohepatitis)     Family History  Problem Relation Age of Onset  . Family history unknown: Yes    History   Social History  . Marital Status: Married    Spouse Name: N/A  . Number of Children: N/A  . Years of Education: N/A    Occupational History  . Not on file.   Social History Main Topics  . Smoking status: Never Smoker   . Smokeless tobacco: Never Used  . Alcohol Use: No  . Drug Use: No  . Sexual Activity: No   Other Topics Concern  . Not on file   Social History Narrative  . No narrative on file    Past Surgical History  Procedure Laterality Date  . Appendectomy  1953  . Abdominal hysterectomy  1980  . Mastectomy Left ~ 2009  . Breast biopsy Left   . Breast lumpectomy Left      . aspirin EC  325 mg Oral Daily  . atorvastatin  40 mg Oral QHS  . enoxaparin (LOVENOX) injection  30 mg Subcutaneous QHS  . PARoxetine  60 mg Oral QHS  . regadenoson  0.4 mg Intravenous Once   . 0.9 % NaCl with KCl 20 mEq / L 75 mL/hr at 11/30/14 0847    Physical Exam: Blood pressure 127/72, pulse 77, temperature 98 F (36.7 C), temperature source Oral, resp. rate 20, height 5' 4"  (1.626 m), weight 73.5 kg (162 lb 0.6 oz), SpO2 96 %.    Affect appropriate Overweight white female  HEENT: normal Neck supple with no adenopathy JVP normal no bruits no thyromegaly Lungs clear with no wheezing and  good diaphragmatic motion Heart:  S1/S2 soft SEM murmur, no rub, gallop or click PMI normal Abdomen: benighn, BS positve, no tenderness, no AAA no bruit.  No HSM or HJR Distal pulses intact with no bruits No edema Neuro non-focal Skin warm and dry No muscular weakness   Labs:   Lab Results  Component Value Date   WBC 7.2 11/30/2014   HGB 12.0 11/30/2014   HCT 34.3* 11/30/2014   MCV 88.6 11/30/2014   PLT 224 11/30/2014    Recent Labs Lab 11/30/14 0526  NA 137  K 3.5  CL 105  CO2 29  BUN 14  CREATININE 0.77  CALCIUM 8.9  PROT 6.8  BILITOT 0.8  ALKPHOS 96  ALT 42*  AST 32  GLUCOSE 128*   Lab Results  Component Value Date   TROPONINI <0.03 11/30/2014   No results found for: CHOL No results found for: HDL No results found for: LDLCALC No results found for: TRIG No results found for:  CHOLHDL No results found for: LDLDIRECT    Radiology: Ct Head Wo Contrast  11/29/2014   CLINICAL DATA:  Headaches for 1 week. Nausea and chest pain. No injury.  EXAM: CT HEAD WITHOUT CONTRAST  TECHNIQUE: Contiguous axial images were obtained from the base of the skull through the vertex without intravenous contrast.  COMPARISON:  None.  FINDINGS: Diffuse cerebral atrophy. Patchy low-attenuation changes in the deep white matter consistent with small vessel ischemia. No mass effect or midline shift. No abnormal extra-axial fluid collections. Gray-white matter junctions are distinct. Basal cisterns are not effaced. No evidence of acute intracranial hemorrhage. No depressed skull fractures. Small retention cyst in the left maxillary antrum. Mastoid air cells are not opacified.  IMPRESSION: No acute intracranial abnormalities. Mild chronic atrophy and small vessel ischemic changes.   Electronically Signed   By: Lucienne Capers M.D.   On: 11/29/2014 18:31   US Abdomen Complete  11/29/2014   CLINICAL DATA:  Acute onset of generalized abdominal pain. Initial encounter.  EXAM: ULTRASOUND ABDOMEN COMPLETE  COMPARISON:  None.  FINDINGS: Gallbladder: No gallstones or wall thickening visualized. No sonographic Murphy sign noted.  Common bile duct: Diameter: 0.5 cm, within normal limits in caliber.  Liver: No focal lesion identified. Mildly increased parenchymal echogenicity and coarsened echotexture, likely reflecting fatty infiltration.  IVC: No abnormality visualized.  Pancreas: Not visualized due to overlying bowel gas.  Spleen: Size and appearance within normal limits.  Right Kidney: Length: 11.5 cm. Echogenicity within normal limits. No mass or hydronephrosis visualized.  Left Kidney: Length: 11.5 cm. Echogenicity within normal limits. No mass or hydronephrosis visualized.  Abdominal aorta: No aneurysm visualized.  Other findings: None.  IMPRESSION: 1. No acute abnormality seen within the abdomen. Pancreas not  visualized due to overlying bowel gas. 2. Fatty infiltration within the liver.   Electronically Signed   By: Garald Balding M.D.   On: 11/29/2014 22:19   Dg Chest Port 1 View  11/29/2014   CLINICAL DATA:  Mid chest pain radiating to right jaw and arm, headache and hypertension.  EXAM: PORTABLE CHEST - 1 VIEW  COMPARISON:  None.  FINDINGS: The heart size and mediastinal contours are within normal limits. Minimal scarring/ atelectasis at both lung bases. There is no evidence of pulmonary edema, consolidation, pneumothorax, nodule or pleural fluid. The visualized skeletal structures are unremarkable.  IMPRESSION: No active disease.   Electronically Signed   By: Aletta Edouard M.D.   On: 11/29/2014 18:03    EKG:  NSR no acute ST/ T wave changes    ASSESSMENT AND PLAN:  Chest Pain:  Atypical CRF;s  No acute ECG changes and negative troponin  Have ordered lexiscan myovue She does not think she could walk on treadmill Headache:  Per primary service check ESR  CT negative Chol:  Continue statin HTN:  On diuretic  Improved Abdominal Pain:  Benign exam.  [place] negative history of irritable bowel   Will need new primary care provider  Signed: Jenkins Rouge 11/30/2014, 9:24 AM

## 2014-11-30 NOTE — Progress Notes (Signed)
Normal nuc study, Dr. Meda Coffee aware of EKG changes but imaging normal.  Notified pt by phone.  No need for cath.

## 2014-11-30 NOTE — Progress Notes (Signed)
[facility] PROGRESS NOTE  Autumn Conley [id number]:[id number] DOB: [birth year] DOA: 11/29/2014 PCP: No PCP Per Patient  Assessment/Plan  Chest pain - history of hypertension, hyperlipidemia, ST-T changes in the inferolateral leads -  troponins negative -  Stress test:  Had chest pain and ST-segment changes during exam, however, nuclear portion was normal.  No plan for cardiac catheterization -  ECHO pending -  D-dimer neg -  Will do trial of PPI and prn GI cocktail  Abdominal pain, resolved -  LFTs minimally elevated -  Lipase wnl -  [place] normal  Hypokalemia - may be related to hydrochlorothiazide.  -  Resolved with potassium supplementation  Hypertension - Hydrochlorothiazide held due to hypokalemia  -  Consider ACEI   Headache - patient has chronic headaches and takes tramadol. Patient states the headache has worsened from usual. -  CT head is negative for anything acute -  ESR 16 -  May have rebound headache from tramadol.  Will discuss with patient tomorrow and recommend follow up with neurology.    Diet:  NPO Access:  PIV IVF:  yes Proph:  lovenox  Code Status: full Family Communication: patient and her husband and daughter Disposition Plan: pending further evaluation of chest pain/headache   Consultants:  Cardiology  Procedures:  CT head  NM stress   Antibiotics:  none   HPI/Subjective:  Has had ongoing headache for several weeks which was followed by chest pain and pressure, nausea, and bloated abdomen.    Objective: Filed Vitals:   11/30/14 0200 11/30/14 0300 11/30/14 0400 11/30/14 0800  BP: 152/84 127/70 127/72 134/84  Pulse: 87 86 77 76  Temp:   98 F (36.7 C) 98.2 F (36.8 C)  TempSrc:   Oral Oral  Resp:   20 18  Height:      Weight:      SpO2: 93% 94% 96% 97%    Intake/Output Summary (Last 24 hours) at 11/30/14 1108 Last data filed at 11/30/14 0600  Gross per 24 hour  Intake 1523.75 ml  Output    600 ml  Net 923.75 ml   Filed  Weights   11/29/14 2000 11/29/14 2253 11/30/14 0100  Weight: 74.39 kg (164 lb) 73.5 kg (162 lb 0.6 oz) 73.5 kg (162 lb 0.6 oz)    Exam:   General:  WF, No acute distress  HEENT:  NCAT, MMM  Cardiovascular:  RRR, nl S1, S2 no mrg, 2+ pulses, warm extremities  Respiratory:  CTAB, no increased WOB  Abdomen:   NABS, soft, NT/ND  MSK:   Normal tone and bulk, no LEE  Neuro:  Grossly intact, PERRL, EOMI, no obvious cranial nerve deficits, strength 5/5, sensation intact to light touch  Data Reviewed: Basic Metabolic Panel:  Recent Labs Lab 11/29/14 1608 11/30/14 0526  NA 136 137  K 2.8* 3.5  CL 95* 105  CO2 28 29  GLUCOSE 125* 128*  BUN 12 14  CREATININE 0.86 0.77  CALCIUM 9.6 8.9   Liver Function Tests:  Recent Labs Lab 11/29/14 2124 11/30/14 0526  AST 45* 32  ALT 50* 42*  ALKPHOS 120* 96  BILITOT 0.7 0.8  PROT 7.1 6.8  ALBUMIN 4.1 3.6    Recent Labs Lab 11/29/14 2124  LIPASE 26   No results for input(s): AMMONIA in the last 168 hours. CBC:  Recent Labs Lab 11/29/14 1608 11/30/14 0526  WBC 7.0 7.2  NEUTROABS  --  5.7  HGB 14.0 12.0  HCT 39.1 34.3*  MCV 86.7  88.6  PLT 266 224   Cardiac Enzymes:  Recent Labs Lab 11/29/14 0034 11/30/14 0526  TROPONINI <0.03 <0.03   BNP (last 3 results) No results for input(s): BNP in the last 8760 hours.  ProBNP (last 3 results) No results for input(s): PROBNP in the last 8760 hours.  CBG: No results for input(s): GLUCAP in the last 168 hours.  Recent Results (from the past 240 hour(s))  MRSA PCR Screening     Status: None   Collection Time: 11/29/14 10:20 PM  Result Value Ref Range Status   MRSA by PCR NEGATIVE NEGATIVE Final    Comment:        The GeneXpert MRSA Assay (FDA approved for NASAL specimens only), is one component of a comprehensive MRSA colonization surveillance program. It is not intended to diagnose MRSA infection nor to guide or monitor treatment for MRSA infections.       Studies: Ct Head Wo Contrast  11/29/2014   CLINICAL DATA:  Headaches for 1 week. Nausea and chest pain. No injury.  EXAM: CT HEAD WITHOUT CONTRAST  TECHNIQUE: Contiguous axial images were obtained from the base of the skull through the vertex without intravenous contrast.  COMPARISON:  None.  FINDINGS: Diffuse cerebral atrophy. Patchy low-attenuation changes in the deep white matter consistent with small vessel ischemia. No mass effect or midline shift. No abnormal extra-axial fluid collections. Gray-white matter junctions are distinct. Basal cisterns are not effaced. No evidence of acute intracranial hemorrhage. No depressed skull fractures. Small retention cyst in the left maxillary antrum. Mastoid air cells are not opacified.  IMPRESSION: No acute intracranial abnormalities. Mild chronic atrophy and small vessel ischemic changes.   Electronically Signed   By: Lucienne Capers M.D.   On: 11/29/2014 18:31   US Abdomen Complete  11/29/2014   CLINICAL DATA:  Acute onset of generalized abdominal pain. Initial encounter.  EXAM: ULTRASOUND ABDOMEN COMPLETE  COMPARISON:  None.  FINDINGS: Gallbladder: No gallstones or wall thickening visualized. No sonographic Murphy sign noted.  Common bile duct: Diameter: 0.5 cm, within normal limits in caliber.  Liver: No focal lesion identified. Mildly increased parenchymal echogenicity and coarsened echotexture, likely reflecting fatty infiltration.  IVC: No abnormality visualized.  Pancreas: Not visualized due to overlying bowel gas.  Spleen: Size and appearance within normal limits.  Right Kidney: Length: 11.5 cm. Echogenicity within normal limits. No mass or hydronephrosis visualized.  Left Kidney: Length: 11.5 cm. Echogenicity within normal limits. No mass or hydronephrosis visualized.  Abdominal aorta: No aneurysm visualized.  Other findings: None.  IMPRESSION: 1. No acute abnormality seen within the abdomen. Pancreas not visualized due to overlying bowel gas. 2. Fatty  infiltration within the liver.   Electronically Signed   By: Garald Balding M.D.   On: 11/29/2014 22:19   Dg Chest Port 1 View  11/29/2014   CLINICAL DATA:  Mid chest pain radiating to right jaw and arm, headache and hypertension.  EXAM: PORTABLE CHEST - 1 VIEW  COMPARISON:  None.  FINDINGS: The heart size and mediastinal contours are within normal limits. Minimal scarring/ atelectasis at both lung bases. There is no evidence of pulmonary edema, consolidation, pneumothorax, nodule or pleural fluid. The visualized skeletal structures are unremarkable.  IMPRESSION: No active disease.   Electronically Signed   By: Aletta Edouard M.D.   On: 11/29/2014 18:03    Scheduled Meds: . aspirin EC  325 mg Oral Daily  . atorvastatin  40 mg Oral QHS  . enoxaparin (LOVENOX) injection  30  mg Subcutaneous QHS  . PARoxetine  60 mg Oral QHS  . regadenoson  0.4 mg Intravenous Once   Continuous Infusions: . 0.9 % NaCl with KCl 20 mEq / L 75 mL/hr at 11/30/14 9311    Principal Problem:   Chest pain Active Problems:   Hypokalemia   Headache   Hypertension   Hyperlipidemia    Time spent: 30 min    Autumn Conley, [facility] Pager [phone]. If 7PM-7AM, please contact night-coverage at [email or web address], password [facility] 11/30/2014, 11:08 AM  LOS: 1 day

## 2014-11-30 NOTE — Progress Notes (Signed)
Lexiscan myoview completed without complications.  + chest tightness that escalated by minute 5 along with 2 mm ST depression II,III,AVF and V4-6.  Aminophylline relieved tightness.    EKG improved at end of study.  Nuc results to follow.

## 2014-12-01 ENCOUNTER — Observation Stay (HOSPITAL_COMMUNITY): Payer: Medicare Other

## 2014-12-01 DIAGNOSIS — R072 Precordial pain: Secondary | ICD-10-CM | POA: Diagnosis not present

## 2014-12-01 DIAGNOSIS — G4452 New daily persistent headache (NDPH): Secondary | ICD-10-CM | POA: Diagnosis not present

## 2014-12-01 DIAGNOSIS — R4781 Slurred speech: Secondary | ICD-10-CM | POA: Diagnosis not present

## 2014-12-01 DIAGNOSIS — R410 Disorientation, unspecified: Secondary | ICD-10-CM | POA: Diagnosis not present

## 2014-12-01 DIAGNOSIS — R51 Headache: Secondary | ICD-10-CM | POA: Diagnosis not present

## 2014-12-01 DIAGNOSIS — R079 Chest pain, unspecified: Secondary | ICD-10-CM

## 2014-12-01 DIAGNOSIS — I6782 Cerebral ischemia: Secondary | ICD-10-CM | POA: Diagnosis not present

## 2014-12-01 LAB — HEMOGLOBIN A1C
Hgb A1c MFr Bld: 6.1 % — ABNORMAL HIGH (ref 4.8–5.6)
Mean Plasma Glucose: 128 mg/dL

## 2014-12-01 MED ORDER — DEXTROSE 5 % IV SOLN
250.0000 mg | Freq: Two times a day (BID) | INTRAVENOUS | Status: DC
  Administered 2014-12-01 – 2014-12-02 (×2): 250 mg via INTRAVENOUS
  Filled 2014-12-01 (×4): qty 2.5

## 2014-12-01 NOTE — Progress Notes (Addendum)
[facility] PROGRESS NOTE  Autumn Conley GYB:[id number] DOB: 14-Oct-1946 DOA: 11/29/2014 PCP: Cloyd Stagers, MD  Assessment/Plan  Chest pain - history of hypertension, hyperlipidemia, ST-T changes in the inferolateral leads -  troponins negative -  Stress test:  Had chest pain and ST-segment changes during exam, however, nuclear portion was normal.  No plan for cardiac catheterization -  ECHO pending -  D-dimer neg -  Continue PPI and prn GI cocktail  Abdominal pain, resolved -  LFTs minimally elevated -  Lipase wnl -  [place] normal  Hypokalemia - may be related to hydrochlorothiazide.  -  Resolved with potassium supplementation  Hypertension, blood pressure improved - Hydrochlorothiazide held due to hypokalemia  - continue ACEI  Headache - markedly worse headache and had a brief episode of slurred speech and confusion.  Rule out TIA.   -  CT head is negative for anything acute -  ESR 16 -  MRI brain -  Carotid duplex  Hypoxia overnight, possible sleep apnea -  Recommend outpatient sleep study  Diet:  Healthy heart Access:  PIV IVF:  yes Proph:  lovenox  Code Status: full Family Communication: patient and her husband and daughter Disposition Plan: pending further evaluation of chest pain/headache   Consultants:  Cardiology  Procedures:  CT head  NM stress   Antibiotics:  none   HPI/Subjective:  Continues to have bifrontal headache.  No further episodes of slurred speech or confusion.  Denies focal numbness, tingling, weakness.  Denies sinus congestion, runny nose  Objective: Filed Vitals:   11/30/14 2117 11/30/14 2255 12/01/14 0445 12/01/14 0714  BP: 160/84 144/76 113/68 109/61  Pulse: 80 67 67 80  Temp: 97.6 F (36.4 C)  98.1 F (36.7 C) 97.8 F (36.6 C)  TempSrc: Oral  Oral Oral  Resp: 18  18 16   Height:      Weight:   77.3 kg (170 lb 6.7 oz)   SpO2: 95% 97% 94% 99%    Intake/Output Summary (Last 24 hours) at 12/01/14  1339 Last data filed at 12/01/14 0800  Gross per 24 hour  Intake 2017.5 ml  Output    800 ml  Net 1217.5 ml   Filed Weights   11/29/14 2253 11/30/14 0100 12/01/14 0445  Weight: 73.5 kg (162 lb 0.6 oz) 73.5 kg (162 lb 0.6 oz) 77.3 kg (170 lb 6.7 oz)    Exam:   General:  WF, No acute distress  HEENT:  NCAT, MMM, mild sinus pressure  Cardiovascular:  RRR, nl S1, S2 no mrg, 2+ pulses, warm extremities  Respiratory:  CTAB, no increased WOB  Abdomen:   NABS, soft, NT/ND  MSK:   Normal tone and bulk, no LEE  Neuro:  Grossly intact, PERRL, EOMI, no obvious cranial nerve deficits, strength 5/5, sensation intact to light touch  Data Reviewed: Basic Metabolic Panel:  Recent Labs Lab 11/29/14 1608 11/30/14 0526  NA 136 137  K 2.8* 3.5  CL 95* 105  CO2 28 29  GLUCOSE 125* 128*  BUN 12 14  CREATININE 0.86 0.77  CALCIUM 9.6 8.9   Liver Function Tests:  Recent Labs Lab 11/29/14 2124 11/30/14 0526  AST 45* 32  ALT 50* 42*  ALKPHOS 120* 96  BILITOT 0.7 0.8  PROT 7.1 6.8  ALBUMIN 4.1 3.6    Recent Labs Lab 11/29/14 2124  LIPASE 26   No results for input(s): AMMONIA in the last 168 hours. CBC:  Recent Labs Lab 11/29/14 1608 11/30/14 0526  WBC  7.0 7.2  NEUTROABS  --  5.7  HGB 14.0 12.0  HCT 39.1 34.3*  MCV 86.7 88.6  PLT 266 224   Cardiac Enzymes:  Recent Labs Lab 11/29/14 0034 11/30/14 0526 11/30/14 1447  TROPONINI <0.03 <0.03 <0.03   BNP (last 3 results) No results for input(s): BNP in the last 8760 hours.  ProBNP (last 3 results) No results for input(s): PROBNP in the last 8760 hours.  CBG: No results for input(s): GLUCAP in the last 168 hours.  Recent Results (from the past 240 hour(s))  MRSA PCR Screening     Status: None   Collection Time: 11/29/14 10:20 PM  Result Value Ref Range Status   MRSA by PCR NEGATIVE NEGATIVE Final    Comment:        The GeneXpert MRSA Assay (FDA approved for NASAL specimens only), is one component of  a comprehensive MRSA colonization surveillance program. It is not intended to diagnose MRSA infection nor to guide or monitor treatment for MRSA infections.      Studies: Ct Head Wo Contrast  11/29/2014   CLINICAL DATA:  Headaches for 1 week. Nausea and chest pain. No injury.  EXAM: CT HEAD WITHOUT CONTRAST  TECHNIQUE: Contiguous axial images were obtained from the base of the skull through the vertex without intravenous contrast.  COMPARISON:  None.  FINDINGS: Diffuse cerebral atrophy. Patchy low-attenuation changes in the deep white matter consistent with small vessel ischemia. No mass effect or midline shift. No abnormal extra-axial fluid collections. Gray-white matter junctions are distinct. Basal cisterns are not effaced. No evidence of acute intracranial hemorrhage. No depressed skull fractures. Small retention cyst in the left maxillary antrum. Mastoid air cells are not opacified.  IMPRESSION: No acute intracranial abnormalities. Mild chronic atrophy and small vessel ischemic changes.   Electronically Signed   By: Lucienne Capers M.D.   On: 11/29/2014 18:31   US Abdomen Complete  11/29/2014   CLINICAL DATA:  Acute onset of generalized abdominal pain. Initial encounter.  EXAM: ULTRASOUND ABDOMEN COMPLETE  COMPARISON:  None.  FINDINGS: Gallbladder: No gallstones or wall thickening visualized. No sonographic Murphy sign noted.  Common bile duct: Diameter: 0.5 cm, within normal limits in caliber.  Liver: No focal lesion identified. Mildly increased parenchymal echogenicity and coarsened echotexture, likely reflecting fatty infiltration.  IVC: No abnormality visualized.  Pancreas: Not visualized due to overlying bowel gas.  Spleen: Size and appearance within normal limits.  Right Kidney: Length: 11.5 cm. Echogenicity within normal limits. No mass or hydronephrosis visualized.  Left Kidney: Length: 11.5 cm. Echogenicity within normal limits. No mass or hydronephrosis visualized.  Abdominal aorta: No  aneurysm visualized.  Other findings: None.  IMPRESSION: 1. No acute abnormality seen within the abdomen. Pancreas not visualized due to overlying bowel gas. 2. Fatty infiltration within the liver.   Electronically Signed   By: Garald Balding M.D.   On: 11/29/2014 22:19   Nm Myocar Multi W/spect W/wall Motion / Ef  11/30/2014   CLINICAL DATA:  Chest pain  EXAM: Lexiscan Myovue  TECHNIQUE: The patient received IV Lexiscan .20m over 15 seconds. 33.0 mCi of Technetium [id number] injected at 30 seconds. Quantitative SPECT images were obtained in the vertical, horizontal and Bralee Feldt axis planes after a 45 minute delay. Rest images were obtained with similar planes and delay using 10.2 mCi of Technetium [id number].  FINDINGS: ECG: At rest: SR, non-specific ST-T wave abnormalities, At stress: 1 mm horizontal ST depressions  Symptoms:  Chest pain that resolved  with aminophylline injection  RAW Data:  Significant diaphragmatic attenuation  QPS:  Normal perfusion at stress and at rest.  Quantitative Gated SPECT EF: LVEF > 75%, no regional wall motion abnormalities.  IMPRESSION: 1. Low risk pharmacologic nuclear study with normal perfusion at stress and at rest consistent with no prior scar and no ischemia.  2. Ischemic ECG changes during Lexiscan infusion, however normal perfusion images.  3.  Hyperdynamic LVEF, no regional wall motion abnormalities.  Ena Dawley   Electronically Signed   By: Ena Dawley   On: 11/30/2014 15:09   Dg Chest Port 1 View  11/29/2014   CLINICAL DATA:  Mid chest pain radiating to right jaw and arm, headache and hypertension.  EXAM: PORTABLE CHEST - 1 VIEW  COMPARISON:  None.  FINDINGS: The heart size and mediastinal contours are within normal limits. Minimal scarring/ atelectasis at both lung bases. There is no evidence of pulmonary edema, consolidation, pneumothorax, nodule or pleural fluid. The visualized skeletal structures are unremarkable.  IMPRESSION: No active disease.    Electronically Signed   By: Aletta Edouard M.D.   On: 11/29/2014 18:03    Scheduled Meds: . aspirin EC  325 mg Oral Daily  . atorvastatin  40 mg Oral QHS  . enoxaparin (LOVENOX) injection  40 mg Subcutaneous QHS  . lisinopril  10 mg Oral Daily  . pantoprazole  40 mg Oral Daily  . PARoxetine  60 mg Oral QHS   Continuous Infusions:    Principal Problem:   Chest pain Active Problems:   Hypokalemia   Headache   Hypertension   Hyperlipidemia    Time spent: 30 min    Sharanya Templin, [facility] Pager [phone]. If 7PM-7AM, please contact night-coverage at [email or web address], password [facility] 12/01/2014, 1:39 PM  LOS: 2 days

## 2014-12-01 NOTE — Progress Notes (Signed)
2D Echocardiogram Complete.  12/01/2014   Jaquarius Seder [place], RDCS

## 2014-12-01 NOTE — Progress Notes (Signed)
UR completed 

## 2014-12-01 NOTE — Progress Notes (Addendum)
Patient Name: Autumn Conley Date of Encounter: 12/01/2014     Principal Problem:   Chest pain Active Problems:   Hypokalemia   Headache   Hypertension   Hyperlipidemia    SUBJECTIVE  Denies any further CP since yesterday's stress test. No SOB. Per husband, she snores at night.   CURRENT MEDS . aspirin EC  325 mg Oral Daily  . atorvastatin  40 mg Oral QHS  . enoxaparin (LOVENOX) injection  40 mg Subcutaneous QHS  . lisinopril  10 mg Oral Daily  . pantoprazole  40 mg Oral Daily  . PARoxetine  60 mg Oral QHS    OBJECTIVE  Filed Vitals:   11/30/14 2117 11/30/14 2255 12/01/14 0445 12/01/14 0714  BP: 160/84 144/76 113/68 109/61  Pulse: 80 67 67 80  Temp: 97.6 F (36.4 C)  98.1 F (36.7 C) 97.8 F (36.6 C)  TempSrc: Oral  Oral Oral  Resp: 18  18 16   Height:      Weight:   170 lb 6.7 oz (77.3 kg)   SpO2: 95% 97% 94% 99%    Intake/Output Summary (Last 24 hours) at 12/01/14 0935 Last data filed at 12/01/14 0800  Gross per 24 hour  Intake 2017.5 ml  Output    800 ml  Net 1217.5 ml   Filed Weights   11/29/14 2253 11/30/14 0100 12/01/14 0445  Weight: 162 lb 0.6 oz (73.5 kg) 162 lb 0.6 oz (73.5 kg) 170 lb 6.7 oz (77.3 kg)    PHYSICAL EXAM  General: Pleasant, NAD. Neuro: Alert and oriented X 3. Moves all extremities spontaneously. Psych: Normal affect. HEENT:  Normal  Neck: Supple without bruits or JVD. Lungs:  Resp regular and unlabored, CTA. Heart: RRR no s3, s4, or murmurs. Abdomen: Soft, non-tender, non-distended, BS + x 4.  Extremities: No clubbing, cyanosis or edema. DP/PT/Radials 2+ and equal bilaterally.  Accessory Clinical Findings  CBC  Recent Labs  11/29/14 1608 11/30/14 0526  WBC 7.0 7.2  NEUTROABS  --  5.7  HGB 14.0 12.0  HCT 39.1 34.3*  MCV 86.7 88.6  PLT 266 828   Basic Metabolic Panel  Recent Labs  11/29/14 1608 11/30/14 0526  NA 136 137  K 2.8* 3.5  CL 95* 105  CO2 28 29  GLUCOSE 125* 128*  BUN 12 14  CREATININE 0.86  0.77  CALCIUM 9.6 8.9   Liver Function Tests  Recent Labs  11/29/14 2124 11/30/14 0526  AST 45* 32  ALT 50* 42*  ALKPHOS 120* 96  BILITOT 0.7 0.8  PROT 7.1 6.8  ALBUMIN 4.1 3.6    Recent Labs  11/29/14 2124  LIPASE 26   Cardiac Enzymes  Recent Labs  11/29/14 0034 11/30/14 0526 11/30/14 1447  TROPONINI <0.03 <0.03 <0.03   BNP Invalid input(s): POCBNP D-Dimer  Recent Labs  11/29/14 2124  DDIMER 0.35   Hemoglobin A1C  Recent Labs  11/30/14 0526  HGBA1C 6.1*   Thyroid Function Tests  Recent Labs  11/29/14 2300  TSH 0.988    TELE NSR with HR 70-80s, TWI in inferior lead    ECG  NSR without significant ST changes, nonspecific T wave changes  Echocardiogram  pending    Radiology/Studies  Ct Head Wo Contrast  11/29/2014   CLINICAL DATA:  Headaches for 1 week. Nausea and chest pain. No injury.  EXAM: CT HEAD WITHOUT CONTRAST  TECHNIQUE: Contiguous axial images were obtained from the base of the skull through the vertex without intravenous contrast.  COMPARISON:  None.  FINDINGS: Diffuse cerebral atrophy. Patchy low-attenuation changes in the deep white matter consistent with small vessel ischemia. No mass effect or midline shift. No abnormal extra-axial fluid collections. Gray-white matter junctions are distinct. Basal cisterns are not effaced. No evidence of acute intracranial hemorrhage. No depressed skull fractures. Small retention cyst in the left maxillary antrum. Mastoid air cells are not opacified.  IMPRESSION: No acute intracranial abnormalities. Mild chronic atrophy and small vessel ischemic changes.   Electronically Signed   By: Lucienne Capers M.D.   On: 11/29/2014 18:31   US Abdomen Complete  11/29/2014   CLINICAL DATA:  Acute onset of generalized abdominal pain. Initial encounter.  EXAM: ULTRASOUND ABDOMEN COMPLETE  COMPARISON:  None.  FINDINGS: Gallbladder: No gallstones or wall thickening visualized. No sonographic Murphy sign noted.   Common bile duct: Diameter: 0.5 cm, within normal limits in caliber.  Liver: No focal lesion identified. Mildly increased parenchymal echogenicity and coarsened echotexture, likely reflecting fatty infiltration.  IVC: No abnormality visualized.  Pancreas: Not visualized due to overlying bowel gas.  Spleen: Size and appearance within normal limits.  Right Kidney: Length: 11.5 cm. Echogenicity within normal limits. No mass or hydronephrosis visualized.  Left Kidney: Length: 11.5 cm. Echogenicity within normal limits. No mass or hydronephrosis visualized.  Abdominal aorta: No aneurysm visualized.  Other findings: None.  IMPRESSION: 1. No acute abnormality seen within the abdomen. Pancreas not visualized due to overlying bowel gas. 2. Fatty infiltration within the liver.   Electronically Signed   By: Garald Balding M.D.   On: 11/29/2014 22:19   Nm Myocar Multi W/spect W/wall Motion / Ef  11/30/2014   CLINICAL DATA:  Chest pain  EXAM: Lexiscan Myovue  TECHNIQUE: The patient received IV Lexiscan .51m over 15 seconds. 33.0 mCi of Technetium 946mestamibi injected at 30 seconds. Quantitative SPECT images were obtained in the vertical, horizontal and short axis planes after a 45 minute delay. Rest images were obtained with similar planes and delay using 10.2 mCi of Technetium 9961mstamibi.  FINDINGS: ECG: At rest: SR, non-specific ST-T wave abnormalities, At stress: 1 mm horizontal ST depressions  Symptoms:  Chest pain that resolved with aminophylline injection  RAW Data:  Significant diaphragmatic attenuation  QPS:  Normal perfusion at stress and at rest.  Quantitative Gated SPECT EF: LVEF > 75%, no regional wall motion abnormalities.  IMPRESSION: 1. Low risk pharmacologic nuclear study with normal perfusion at stress and at rest consistent with no prior scar and no ischemia.  2. Ischemic ECG changes during Lexiscan infusion, however normal perfusion images.  3.  Hyperdynamic LVEF, no regional wall motion abnormalities.   KatEna DawleyElectronically Signed   By: KatEna DawleyOn: 11/30/2014 15:09   Dg Chest Port 1 View  11/29/2014   CLINICAL DATA:  Mid chest pain radiating to right jaw and arm, headache and hypertension.  EXAM: PORTABLE CHEST - 1 VIEW  COMPARISON:  None.  FINDINGS: The heart size and mediastinal contours are within normal limits. Minimal scarring/ atelectasis at both lung bases. There is no evidence of pulmonary edema, consolidation, pneumothorax, nodule or pleural fluid. The visualized skeletal structures are unremarkable.  IMPRESSION: No active disease.   Electronically Signed   By: GleAletta EdouardD.   On: 11/29/2014 18:03    ASSESSMENT AND PLAN  1. Atypical chest pain  - Myoview had ischemic changes in EKG but no ischemia or prior infarct. EF >75%  - per discussion between Dr.  Meda Coffee and Mickel Baas yesterday, no cath. Pending echo today. Likely can be discharge if echo is normal. Will arrange followup with Dr. Johnsie Cancel.  2. Labile HTN: on HCTZ at home, currently on lisinopril, BP well controlled  3. HLD 4. Headache: CT negative 5. Abdominal pain: U/S negative  6. Hypokalemia: maybe related to HCTZ, now on lisinopril 7. Pre-diabetes: Hgb A1C 6.1 8. Possible OSA: snore at night, with some drop in O2 sat. Headache  - may need outpatient sleep study  Weston Brass Woodward Ku Pager: [phone]  I have seen and examined the patient along with Almyra Deforest PA-C.  I have reviewed the chart, notes and new data.  I agree with PA's note.  Echo performed, but not available for review. If normal, further cardiac workup is not planned at this time.  Sanda Klein, MD, [place] [phone] 12/01/2014, 2:54 PM

## 2014-12-02 DIAGNOSIS — R079 Chest pain, unspecified: Secondary | ICD-10-CM | POA: Diagnosis not present

## 2014-12-02 DIAGNOSIS — K219 Gastro-esophageal reflux disease without esophagitis: Secondary | ICD-10-CM

## 2014-12-02 DIAGNOSIS — G4440 Drug-induced headache, not elsewhere classified, not intractable: Secondary | ICD-10-CM | POA: Diagnosis not present

## 2014-12-02 DIAGNOSIS — R51 Headache: Secondary | ICD-10-CM | POA: Diagnosis not present

## 2014-12-02 DIAGNOSIS — E876 Hypokalemia: Secondary | ICD-10-CM | POA: Diagnosis not present

## 2014-12-02 DIAGNOSIS — E785 Hyperlipidemia, unspecified: Secondary | ICD-10-CM | POA: Diagnosis not present

## 2014-12-02 DIAGNOSIS — R072 Precordial pain: Secondary | ICD-10-CM | POA: Diagnosis not present

## 2014-12-02 DIAGNOSIS — I10 Essential (primary) hypertension: Secondary | ICD-10-CM | POA: Diagnosis not present

## 2014-12-02 DIAGNOSIS — G459 Transient cerebral ischemic attack, unspecified: Secondary | ICD-10-CM

## 2014-12-02 MED ORDER — LISINOPRIL 10 MG PO TABS: 10.0000 mg | ORAL_TABLET | Freq: Every day | ORAL | Status: DC

## 2014-12-02 MED ORDER — ASPIRIN 325 MG PO TBEC: 325.0000 mg | DELAYED_RELEASE_TABLET | Freq: Every day | ORAL | Status: DC

## 2014-12-02 MED ORDER — OMEPRAZOLE 20 MG PO CPDR: 20.0000 mg | DELAYED_RELEASE_CAPSULE | Freq: Every day | ORAL | Status: DC

## 2014-12-02 MED ORDER — DIVALPROEX SODIUM 250 MG PO DR TAB: 250.0000 mg | DELAYED_RELEASE_TABLET | Freq: Two times a day (BID) | ORAL | Status: DC

## 2014-12-02 NOTE — Progress Notes (Signed)
Utilization Review completed.  

## 2014-12-02 NOTE — Discharge Summary (Signed)
Physician Discharge Summary  Autumn Conley GDJ:[id number] DOB: 08/18/1946 DOA: 11/29/2014  PCP: Cloyd Stagers, MD  Admit date: 11/29/2014 Discharge date: 12/02/2014  Recommendations for Outpatient Follow-up:  1. F/u with primary care doctor within 1 week of discharge 2. Continue PPI with maalox prn 3. Changed BP medication to lisinopril.  Repeat BMP at your follow up appointment 4. Recommend referral to neurology for follow up of headache.   Started depakote in the mean time 5. Outpatient sleep study  Discharge Diagnoses:  Principal Problem:   Chest pain Active Problems:   Hypokalemia   Headache   Hypertension   Hyperlipidemia   Discharge Condition: stable, improved  Diet recommendation: healthy heart  Wt Readings from Last 3 Encounters:  12/02/14 77.3 kg (170 lb 6.7 oz)    History of present illness:  Autumn Conley is a 69 y.o. female with history of hypertension, hyperlipidemia and chronic headaches presents to the ER because of headache and chest pain. Patient had chest pain over the last 1 week which has been persistent, retrosternal nonradiating pressure-like squeezing type associated with some nausea.  Denied vomiting, but +  epigastric discomfort. In addition patient had frontal headache no longer responding to tramadol.  Had a brief episode of slurred speech and confusion witnessed by family. CT head did not show anything acute.   Patient's cardiac markers were negative. EKG minimal ST depression in the inferolateral leads.   Hospital Course:   Chest pain, seemed to improve with PPI, but had hx of hypertension, hyperlipidemia, ST-T changes in the inferolateral leads -  Cardiology was consulted - troponins were negative - Stress test: Had chest pain and ST-segment changes during exam, however, nuclear portion was normal. No plan for cardiac catheterization - ECHO:  Normal EF, normal LV, normal valves - D-dimer neg - Continue PPI at discharge  Abdominal  pain, resolved - LFTs minimally elevated, but has known fatty liver - Lipase wnl - [place] normal  Hypokalemia - may be related to hydrochlorothiazide.  - Resolved with potassium supplementation and stopping HCTZ  Hypertension, blood pressure remained somewhat elevated - Hydrochlorothiazide held due to hypokalemia  - started ACEI -  Will need BMP at follow up appointment  Headache - markedly worse headache and had a brief episode of slurred speech and confusion.  Atypical even for TIA.  I think she likely has tension headache with a superimposed rebound headache.  Rule out TIA.  - CT head is negative for anything acute - ESR 16 - MRI brain:  No acute problems - Carotid duplex:  Prelim is normal -  Start depakote as a trial and it helped.   -  Given rx for depakote to continue until she can follow up with her PCP or neurologist for ongoing HA management -  Advised to minimize use of NSAIDS/Ultram as much as possible  Hypoxia overnight, possible sleep apnea - Recommend outpatient sleep study  Procedures:  Stress test  ECHO  MRI brain  Carotid duplex  Consultations:  Cardiology  Discharge Exam: Filed Vitals:   12/02/14 0822  BP: 157/81  Pulse:   Temp: 97.3 F (36.3 C)  Resp: 20   Filed Vitals:   12/01/14 2326 12/02/14 0100 12/02/14 0631 12/02/14 0822  BP: 147/79  144/89 157/81  Pulse: 71  71   Temp: 98 F (36.7 C)  98.4 F (36.9 C) 97.3 F (36.3 C)  TempSrc: Oral  Oral Oral  Resp: 18  20 20   Height:      Weight:  77.3 kg (170 lb 6.7 oz)    SpO2: 95%  96% 94%     General: WF, No acute distress  HEENT: NCAT, MMM, mild sinus pressure  Cardiovascular: RRR, nl S1, S2 no mrg, 2+ pulses, warm extremities  Respiratory: CTAB, no increased WOB  Abdomen: NABS, soft, NT/ND  MSK: Normal tone and bulk, no LEE  Neuro: Grossly intact, PERRL, EOMI, no obvious cranial nerve deficits, strength 5/5, sensation intact to light touch  Discharge  Instructions      Discharge Instructions    Call MD for:  difficulty breathing, headache or visual disturbances    Complete by:  As directed      Call MD for:  extreme fatigue    Complete by:  As directed      Call MD for:  hives    Complete by:  As directed      Call MD for:  persistant dizziness or light-headedness    Complete by:  As directed      Call MD for:  persistant nausea and vomiting    Complete by:  As directed      Call MD for:  severe uncontrolled pain    Complete by:  As directed      Call MD for:  temperature >100.4    Complete by:  As directed      Diet - low sodium heart healthy    Complete by:  As directed      Discharge instructions    Complete by:  As directed   You were hospitalized with chest pain, headache, and a brief episode of slurred speech.  You had a negative stress test and the cardiologists will follow up with you in clinic in about a month.  Please take omeprazole for the next month to see if it helps your chest pain and abdominal discomfort.  For your headache, please use depakote (valproic) twice a day to prevent headache until you can discuss your symptoms with your primary care doctor.  Please try to minimize your use of goody's powders, ibuprofen, tramadol as much as possible because these may be causing rebound headaches.  If you do have a severe headache, however, feel free to take a dose.     Increase activity slowly    Complete by:  As directed             Medication List    STOP taking these medications        hydrochlorothiazide 25 MG tablet  Commonly known as:  HYDRODIURIL      TAKE these medications        aspirin EC 81 MG tablet  Take 81 mg by mouth daily.     atorvastatin 40 MG tablet  Commonly known as:  LIPITOR  Take 40 mg by mouth at bedtime.     cyclobenzaprine 10 MG tablet  Commonly known as:  FLEXERIL  Take 10 mg by mouth 3 (three) times daily as needed for muscle spasms.     DIALYVITE VITAMIN D 5000 PO  Take 1  capsule by mouth daily.     divalproex 250 MG DR tablet  Commonly known as:  DEPAKOTE  Take 1 tablet (250 mg total) by mouth 2 (two) times daily.     GOODY HEADACHE PO  Take 1 packet by mouth daily as needed (headache / pain).     lisinopril 10 MG tablet  Commonly known as:  PRINIVIL,ZESTRIL  Take 1 tablet (10 mg total) by mouth  daily.     omeprazole 20 MG capsule  Commonly known as:  PRILOSEC  Take 1 capsule (20 mg total) by mouth daily.     PARoxetine 20 MG tablet  Commonly known as:  PAXIL  Take 60 mg by mouth at bedtime.     traMADol 50 MG tablet  Commonly known as:  ULTRAM  Take 50 mg by mouth every 4 (four) hours as needed for moderate pain.     vitamin E 100 UNIT capsule  Take 400 Units by mouth daily.       Follow-up Information    Follow up with Jenkins Rouge, MD On 01/01/2015.   Specialty:  Cardiology   Why:  3:45pm   Contact information:   [address] [place] [address] [phone]       Follow up with Nena Jordan, Mammie Lorenzo, MD On 12/28/2014.   Specialty:  Internal Medicine   Contact information:   [address] [place] [address] [phone]        The results of significant diagnostics from this hospitalization (including imaging, microbiology, ancillary and laboratory) are listed below for reference.    Significant Diagnostic Studies: Ct Head Wo Contrast  11/29/2014   CLINICAL DATA:  Headaches for 1 week. Nausea and chest pain. No injury.  EXAM: CT HEAD WITHOUT CONTRAST  TECHNIQUE: Contiguous axial images were obtained from the base of the skull through the vertex without intravenous contrast.  COMPARISON:  None.  FINDINGS: Diffuse cerebral atrophy. Patchy low-attenuation changes in the deep white matter consistent with small vessel ischemia. No mass effect or midline shift. No abnormal extra-axial fluid collections. Gray-white matter junctions are distinct. Basal cisterns are not effaced. No evidence of  acute intracranial hemorrhage. No depressed skull fractures. Small retention cyst in the left maxillary antrum. Mastoid air cells are not opacified.  IMPRESSION: No acute intracranial abnormalities. Mild chronic atrophy and small vessel ischemic changes.   Electronically Signed   By: Lucienne Capers M.D.   On: 11/29/2014 18:31   Mr Brain Wo Contrast  12/01/2014   CLINICAL DATA:  Headache and brief episode of slurred speech and confusion. Possible TIA.  EXAM: MRI HEAD WITHOUT CONTRAST  TECHNIQUE: Multiplanar, multiecho pulse sequences of the brain and surrounding structures were obtained without intravenous contrast.  COMPARISON:  Head CT 11/29/2014  FINDINGS: There is no evidence of acute infarct, intracranial hemorrhage, mass, midline shift, or extra-axial fluid collection. There is mild generalized cerebral atrophy. Patchy T2 hyperintensities throughout the subcortical and deep cerebral white matter and pons are nonspecific but compatible with moderate chronic small vessel ischemic disease.  Orbits are unremarkable. Small left maxillary sinus mucous retention cyst is noted. Mastoid air cells are clear. Major intracranial vascular flow voids are preserved.  IMPRESSION: 1. No acute intracranial abnormality. 2. Moderate chronic small vessel ischemic disease.   Electronically Signed   By: Logan Bores   On: 12/01/2014 16:44   US Abdomen Complete  11/29/2014   CLINICAL DATA:  Acute onset of generalized abdominal pain. Initial encounter.  EXAM: ULTRASOUND ABDOMEN COMPLETE  COMPARISON:  None.  FINDINGS: Gallbladder: No gallstones or wall thickening visualized. No sonographic Murphy sign noted.  Common bile duct: Diameter: 0.5 cm, within normal limits in caliber.  Liver: No focal lesion identified. Mildly increased parenchymal echogenicity and coarsened echotexture, likely reflecting fatty infiltration.  IVC: No abnormality visualized.  Pancreas: Not visualized due to overlying bowel gas.  Spleen: Size and  appearance within normal  limits.  Right Kidney: Length: 11.5 cm. Echogenicity within normal limits. No mass or hydronephrosis visualized.  Left Kidney: Length: 11.5 cm. Echogenicity within normal limits. No mass or hydronephrosis visualized.  Abdominal aorta: No aneurysm visualized.  Other findings: None.  IMPRESSION: 1. No acute abnormality seen within the abdomen. Pancreas not visualized due to overlying bowel gas. 2. Fatty infiltration within the liver.   Electronically Signed   By: Garald Balding M.D.   On: 11/29/2014 22:19   Nm Myocar Multi W/spect W/wall Motion / Ef  11/30/2014   CLINICAL DATA:  Chest pain  EXAM: Lexiscan Myovue  TECHNIQUE: The patient received IV Lexiscan .28m over 15 seconds. 33.0 mCi of Technetium 982mestamibi injected at 30 seconds. Quantitative SPECT images were obtained in the vertical, horizontal and [place] axis planes after a 45 minute delay. Rest images were obtained with similar planes and delay using 10.2 mCi of Technetium 9912mstamibi.  FINDINGS: ECG: At rest: SR, non-specific ST-T wave abnormalities, At stress: 1 mm horizontal ST depressions  Symptoms:  Chest pain that resolved with aminophylline injection  RAW Data:  Significant diaphragmatic attenuation  QPS:  Normal perfusion at stress and at rest.  Quantitative Gated SPECT EF: LVEF > 75%, no regional wall motion abnormalities.  IMPRESSION: 1. Low risk pharmacologic nuclear study with normal perfusion at stress and at rest consistent with no prior scar and no ischemia.  2. Ischemic ECG changes during Lexiscan infusion, however normal perfusion images.  3.  Hyperdynamic LVEF, no regional wall motion abnormalities.  KatEna DawleyElectronically Signed   By: KatEna DawleyOn: 11/30/2014 15:09   Dg Chest Port 1 View  11/29/2014   CLINICAL DATA:  Mid chest pain radiating to right jaw and arm, headache and hypertension.  EXAM: PORTABLE CHEST - 1 VIEW  COMPARISON:  None.  FINDINGS: The heart size and mediastinal  contours are within normal limits. Minimal scarring/ atelectasis at both lung bases. There is no evidence of pulmonary edema, consolidation, pneumothorax, nodule or pleural fluid. The visualized skeletal structures are unremarkable.  IMPRESSION: No active disease.   Electronically Signed   By: GleAletta EdouardD.   On: 11/29/2014 18:03    Microbiology: Recent Results (from the past 240 hour(s))  MRSA PCR Screening     Status: None   Collection Time: 11/29/14 10:20 PM  Result Value Ref Range Status   MRSA by PCR NEGATIVE NEGATIVE Final    Comment:        The GeneXpert MRSA Assay (FDA approved for NASAL specimens only), is one component of a comprehensive MRSA colonization surveillance program. It is not intended to diagnose MRSA infection nor to guide or monitor treatment for MRSA infections.      Labs: Basic Metabolic Panel:  Recent Labs Lab 11/29/14 1608 11/30/14 0526  NA 136 137  K 2.8* 3.5  CL 95* 105  CO2 28 29  GLUCOSE 125* 128*  BUN 12 14  CREATININE 0.86 0.77  CALCIUM 9.6 8.9   Liver Function Tests:  Recent Labs Lab 11/29/14 2124 11/30/14 0526  AST 45* 32  ALT 50* 42*  ALKPHOS 120* 96  BILITOT 0.7 0.8  PROT 7.1 6.8  ALBUMIN 4.1 3.6    Recent Labs Lab 11/29/14 2124  LIPASE 26   No results for input(s): AMMONIA in the last 168 hours. CBC:  Recent Labs Lab 11/29/14 1608 11/30/14 0526  WBC 7.0 7.2  NEUTROABS  --  5.7  HGB 14.0 12.0  HCT 39.1 34.3*  MCV 86.7 88.6  PLT 266 224   Cardiac Enzymes:  Recent Labs Lab 11/29/14 0034 11/30/14 0526 11/30/14 1447  TROPONINI <0.03 <0.03 <0.03   BNP: BNP (last 3 results) No results for input(s): BNP in the last 8760 hours.  ProBNP (last 3 results) No results for input(s): PROBNP in the last 8760 hours.  CBG: No results for input(s): GLUCAP in the last 168 hours.  Time coordinating discharge: 45 minutes  Signed:  Nidal Rivet  [facility] 12/02/2014, 10:56 AM

## 2014-12-02 NOTE — Progress Notes (Signed)
Patient Name: Autumn Conley Date of Encounter: 12/02/2014     Principal Problem:   Chest pain Active Problems:   Hypokalemia   Headache   Hypertension   Hyperlipidemia    SUBJECTIVE 69 yo  Admitted with head ache, chest pain and abd. Pain  [place] near [place].    myoview was negative for ischemia. Echo:   Reveals normal LV function.  CURRENT MEDS . aspirin EC  325 mg Oral Daily  . atorvastatin  40 mg Oral QHS  . enoxaparin (LOVENOX) injection  40 mg Subcutaneous QHS  . lisinopril  10 mg Oral Daily  . pantoprazole  40 mg Oral Daily  . PARoxetine  60 mg Oral QHS  . valproate sodium  250 mg Intravenous Q12H    OBJECTIVE  Filed Vitals:   12/01/14 1939 12/01/14 2326 12/02/14 0100 12/02/14 0631  BP: 116/62 147/79  144/89  Pulse: 72 71  71  Temp: 97.9 F (36.6 C) 98 F (36.7 C)  98.4 F (36.9 C)  TempSrc: Oral Oral  Oral  Resp: 18 18  20   Height:      Weight:   170 lb 6.7 oz (77.3 kg)   SpO2: 94% 95%  96%    Intake/Output Summary (Last 24 hours) at 12/02/14 0814 Last data filed at 12/01/14 1816  Gross per 24 hour  Intake    480 ml  Output      0 ml  Net    480 ml   Filed Weights   11/30/14 0100 12/01/14 0445 12/02/14 0100  Weight: 162 lb 0.6 oz (73.5 kg) 170 lb 6.7 oz (77.3 kg) 170 lb 6.7 oz (77.3 kg)    PHYSICAL EXAM  General: Pleasant, NAD. Neuro: Alert and oriented X 3. Moves all extremities spontaneously. Psych: Normal affect. HEENT:  Normal  Neck: Supple without bruits or JVD.  Normal carotids Lungs:  Resp regular and unlabored, CTA. Heart: RRR no s3, s4, or murmurs. Abdomen: Soft, non-tender, non-distended, BS + x 4.  Extremities: No clubbing, cyanosis or edema. DP/PT/Radials 2+ and equal bilaterally.  Accessory Clinical Findings  CBC  Recent Labs  11/29/14 1608 11/30/14 0526  WBC 7.0 7.2  NEUTROABS  --  5.7  HGB 14.0 12.0  HCT 39.1 34.3*  MCV 86.7 88.6  PLT 266 315   Basic Metabolic Panel  Recent Labs  11/29/14 1608  11/30/14 0526  NA 136 137  K 2.8* 3.5  CL 95* 105  CO2 28 29  GLUCOSE 125* 128*  BUN 12 14  CREATININE 0.86 0.77  CALCIUM 9.6 8.9   Liver Function Tests  Recent Labs  11/29/14 2124 11/30/14 0526  AST 45* 32  ALT 50* 42*  ALKPHOS 120* 96  BILITOT 0.7 0.8  PROT 7.1 6.8  ALBUMIN 4.1 3.6    Recent Labs  11/29/14 2124  LIPASE 26   Cardiac Enzymes  Recent Labs  11/30/14 0526 11/30/14 1447  TROPONINI <0.03 <0.03   BNP Invalid input(s): POCBNP D-Dimer  Recent Labs  11/29/14 2124  DDIMER 0.35   Hemoglobin A1C  Recent Labs  11/30/14 0526  HGBA1C 6.1*   Thyroid Function Tests  Recent Labs  11/29/14 2300  TSH 0.988    TELE NSR with HR 70-80s, TWI in inferior lead    ECG  NSR without significant ST changes, nonspecific T wave changes  Echocardiogram  Normal LV function.   Trivial AI     Radiology/Studies  Ct Head Wo Contrast  11/29/2014   CLINICAL  DATA:  Headaches for 1 week. Nausea and chest pain. No injury.  EXAM: CT HEAD WITHOUT CONTRAST  TECHNIQUE: Contiguous axial images were obtained from the base of the skull through the vertex without intravenous contrast.  COMPARISON:  None.  FINDINGS: Diffuse cerebral atrophy. Patchy low-attenuation changes in the deep white matter consistent with small vessel ischemia. No mass effect or midline shift. No abnormal extra-axial fluid collections. Gray-white matter junctions are distinct. Basal cisterns are not effaced. No evidence of acute intracranial hemorrhage. No depressed skull fractures. Small retention cyst in the left maxillary antrum. Mastoid air cells are not opacified.  IMPRESSION: No acute intracranial abnormalities. Mild chronic atrophy and small vessel ischemic changes.   Electronically Signed   By: Lucienne Capers M.D.   On: 11/29/2014 18:31   Mr Brain Wo Contrast  12/01/2014   CLINICAL DATA:  Headache and brief episode of slurred speech and confusion. Possible TIA.  EXAM: MRI HEAD WITHOUT  CONTRAST  TECHNIQUE: Multiplanar, multiecho pulse sequences of the brain and surrounding structures were obtained without intravenous contrast.  COMPARISON:  Head CT 11/29/2014  FINDINGS: There is no evidence of acute infarct, intracranial hemorrhage, mass, midline shift, or extra-axial fluid collection. There is mild generalized cerebral atrophy. Patchy T2 hyperintensities throughout the subcortical and deep cerebral white matter and pons are nonspecific but compatible with moderate chronic small vessel ischemic disease.  Orbits are unremarkable. Small left maxillary sinus mucous retention cyst is noted. Mastoid air cells are clear. Major intracranial vascular flow voids are preserved.  IMPRESSION: 1. No acute intracranial abnormality. 2. Moderate chronic small vessel ischemic disease.   Electronically Signed   By: Logan Bores   On: 12/01/2014 16:44   US Abdomen Complete  11/29/2014   CLINICAL DATA:  Acute onset of generalized abdominal pain. Initial encounter.  EXAM: ULTRASOUND ABDOMEN COMPLETE  COMPARISON:  None.  FINDINGS: Gallbladder: No gallstones or wall thickening visualized. No sonographic Murphy sign noted.  Common bile duct: Diameter: 0.5 cm, within normal limits in caliber.  Liver: No focal lesion identified. Mildly increased parenchymal echogenicity and coarsened echotexture, likely reflecting fatty infiltration.  IVC: No abnormality visualized.  Pancreas: Not visualized due to overlying bowel gas.  Spleen: Size and appearance within normal limits.  Right Kidney: Length: 11.5 cm. Echogenicity within normal limits. No mass or hydronephrosis visualized.  Left Kidney: Length: 11.5 cm. Echogenicity within normal limits. No mass or hydronephrosis visualized.  Abdominal aorta: No aneurysm visualized.  Other findings: None.  IMPRESSION: 1. No acute abnormality seen within the abdomen. Pancreas not visualized due to overlying bowel gas. 2. Fatty infiltration within the liver.   Electronically Signed   By:  Garald Balding M.D.   On: 11/29/2014 22:19   Nm Myocar Multi W/spect W/wall Motion / Ef  11/30/2014   CLINICAL DATA:  Chest pain  EXAM: Lexiscan Myovue  TECHNIQUE: The patient received IV Lexiscan .42m over 15 seconds. 33.0 mCi of Technetium [id number] injected at 30 seconds. Quantitative SPECT images were obtained in the vertical, horizontal and short axis planes after a 45 minute delay. Rest images were obtained with similar planes and delay using 10.2 mCi of Technetium 9916mstamibi.  FINDINGS: ECG: At rest: SR, non-specific ST-T wave abnormalities, At stress: 1 mm horizontal ST depressions  Symptoms:  Chest pain that resolved with aminophylline injection  RAW Data:  Significant diaphragmatic attenuation  QPS:  Normal perfusion at stress and at rest.  Quantitative Gated SPECT EF: LVEF > 75%, no regional wall motion abnormalities.  IMPRESSION: 1. Low risk pharmacologic nuclear study with normal perfusion at stress and at rest consistent with no prior scar and no ischemia.  2. Ischemic ECG changes during Lexiscan infusion, however normal perfusion images.  3.  Hyperdynamic LVEF, no regional wall motion abnormalities.  Ena Dawley   Electronically Signed   By: Ena Dawley   On: 11/30/2014 15:09   Dg Chest Port 1 View  11/29/2014   CLINICAL DATA:  Mid chest pain radiating to right jaw and arm, headache and hypertension.  EXAM: PORTABLE CHEST - 1 VIEW  COMPARISON:  None.  FINDINGS: The heart size and mediastinal contours are within normal limits. Minimal scarring/ atelectasis at both lung bases. There is no evidence of pulmonary edema, consolidation, pneumothorax, nodule or pleural fluid. The visualized skeletal structures are unremarkable.  IMPRESSION: No active disease.   Electronically Signed   By: Aletta Edouard M.D.   On: 11/29/2014 18:03    ASSESSMENT AND PLAN  1. Atypical chest pain  - Myoview had ischemic changes in EKG but no ischemia or prior infarct. EF >75%  Echo is normal  She  can be discharged from our standpoint.   2. Labile HTN: on HCTZ at home, currently on lisinopril, BP well controlled  3. HLD 4. Headache: CT negative 5. Abdominal pain: U/S negative  6. Hypokalemia: maybe related to HCTZ, now on lisinopril 7. Pre-diabetes: Hgb A1C 6.1 8. Possible OSA: snore at night, with some drop in O2 sat. Headache  - may need outpatient sleep study 9. ? Of TIA.  There is mention of slurred speech. MRI is normal.  For carotid duplex today.  Should be able to go home later today.     Thayer Headings, Brooke Bonito., MD, [facility] 12/02/2014, 8:21 AM [address],  [place] Pager [phone]

## 2014-12-02 NOTE — Progress Notes (Signed)
*  PRELIMINARY RESULTS* Vascular Ultrasound Carotid Duplex (Doppler) has been completed.   Findings suggest 1-39% internal carotid artery stenosis bilaterally. Vertebral arteries are patent with antegrade flow.  12/02/2014 10:40 AM Maudry Mayhew, RVT, RDCS, RDMS

## 2014-12-05 DIAGNOSIS — K760 Fatty (change of) liver, not elsewhere classified: Secondary | ICD-10-CM | POA: Diagnosis not present

## 2014-12-05 DIAGNOSIS — R51 Headache: Secondary | ICD-10-CM | POA: Diagnosis not present

## 2014-12-05 DIAGNOSIS — R079 Chest pain, unspecified: Secondary | ICD-10-CM | POA: Diagnosis not present

## 2014-12-05 DIAGNOSIS — E785 Hyperlipidemia, unspecified: Secondary | ICD-10-CM | POA: Diagnosis not present

## 2014-12-05 DIAGNOSIS — E876 Hypokalemia: Secondary | ICD-10-CM | POA: Diagnosis not present

## 2014-12-05 DIAGNOSIS — Z09 Encounter for follow-up examination after completed treatment for conditions other than malignant neoplasm: Secondary | ICD-10-CM | POA: Diagnosis not present

## 2014-12-05 DIAGNOSIS — G4734 Idiopathic sleep related nonobstructive alveolar hypoventilation: Secondary | ICD-10-CM | POA: Diagnosis not present

## 2014-12-05 DIAGNOSIS — I10 Essential (primary) hypertension: Secondary | ICD-10-CM | POA: Diagnosis not present

## 2014-12-05 DIAGNOSIS — M797 Fibromyalgia: Secondary | ICD-10-CM | POA: Diagnosis not present

## 2014-12-05 DIAGNOSIS — R109 Unspecified abdominal pain: Secondary | ICD-10-CM | POA: Diagnosis not present

## 2014-12-05 DIAGNOSIS — F418 Other specified anxiety disorders: Secondary | ICD-10-CM | POA: Diagnosis not present

## 2014-12-07 ENCOUNTER — Telehealth: Payer: Self-pay | Admitting: Cardiovascular Disease

## 2014-12-07 ENCOUNTER — Ambulatory Visit
Admission: RE | Admit: 2014-12-07 | Discharge: 2014-12-07 | Disposition: A | Discharge status: Home / Self Care | Payer: Medicare Other | Source: Ambulatory Visit | Attending: Internal Medicine | Admitting: Internal Medicine

## 2014-12-07 ENCOUNTER — Other Ambulatory Visit: Payer: Self-pay | Admitting: Internal Medicine

## 2014-12-07 DIAGNOSIS — R0602 Shortness of breath: Secondary | ICD-10-CM

## 2014-12-07 DIAGNOSIS — R197 Diarrhea, unspecified: Secondary | ICD-10-CM | POA: Diagnosis not present

## 2014-12-07 DIAGNOSIS — R1011 Right upper quadrant pain: Secondary | ICD-10-CM | POA: Diagnosis not present

## 2014-12-07 DIAGNOSIS — R05 Cough: Secondary | ICD-10-CM | POA: Diagnosis not present

## 2014-12-07 DIAGNOSIS — R109 Unspecified abdominal pain: Secondary | ICD-10-CM | POA: Diagnosis not present

## 2014-12-07 NOTE — Telephone Encounter (Signed)
She does not need to have cardiology f/u according to Dr Tamala Julian echo and myovue were normal She can be referred back to Dr Tamala Julian if she needs reevaluation

## 2014-12-07 NOTE — Telephone Encounter (Signed)
New Msg        Christina from [facility] calling, would like to know if pt has been released from Dr. Johnsie Cancel to Dr. Tamala Julian?   Pt is more comfortable with Dr. Tamala Julian because he performed her procedure.  Please return call to [facility] after 8:00 am on  12/08/14 at [phone] ext. [phone].

## 2014-12-07 NOTE — Telephone Encounter (Signed)
WILL FORWARD TO DR Johnsie Cancel FOR  REVIEW./CY

## 2014-12-08 NOTE — Telephone Encounter (Addendum)
CHRISTINA   NOTIFIED  AT  [facility]  DOES NOT  REQUIRE  F/U AS BOTH TESTS WERE  NORMAL   [facility]  IS   INSISTING ON AN   APPT   PHONE  CALL TRANSFERRED TO  SCHEDULERS  TO  MAKE  APPT./CY

## 2014-12-18 ENCOUNTER — Ambulatory Visit (INDEPENDENT_AMBULATORY_CARE_PROVIDER_SITE_OTHER): Payer: Medicare Other | Admitting: Neurology

## 2014-12-18 ENCOUNTER — Encounter: Payer: Self-pay | Admitting: Neurology

## 2014-12-18 VITALS — BP 153/90 | HR 77 | Ht 64.0 in | Wt 164.0 lb

## 2014-12-18 DIAGNOSIS — G43009 Migraine without aura, not intractable, without status migrainosus: Secondary | ICD-10-CM | POA: Insufficient documentation

## 2014-12-18 DIAGNOSIS — I679 Cerebrovascular disease, unspecified: Secondary | ICD-10-CM | POA: Diagnosis not present

## 2014-12-18 MED ORDER — DICLOFENAC POTASSIUM(MIGRAINE) 50 MG PO PACK: 50.0000 mg | PACK | ORAL | Status: DC | PRN

## 2014-12-18 MED ORDER — DIVALPROEX SODIUM ER 500 MG PO TB24: 500.0000 mg | ORAL_TABLET | Freq: Every day | ORAL | Status: DC

## 2014-12-18 NOTE — Progress Notes (Signed)
  PATIENT: Autumn Conley DOB: 08/02/1946  HISTORICAL  Bre Job is a 69 yo RH female, is referred by her primary care Dr. JARALLA SHAMLEFFER, IBTEHAL, accompanied by her daughter Tina for evaluation of headaches  She had a history of hypertension, hyperlipidemia, depression, left breast cancer, status post left lobectomy, bilateral glaucoma, was found during routine examination, receiving eyedrops.  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  In November 29 2014, she presented to the emergency room for 2 weeks history of persistent headaches, chest pain, had extensive evaluations,  I have reviewed MRI brain in Dec 01 2014: . No acute intracranial abnormality, moderate small vessel disease  Laboratory in February twelfth 2016, mild elevated A1c 6.1, normal TSH, CBC, CMP.  Normal ultrasound of carotid artery, and echocardiogram December 02 2014.  She was put on Depakote IR 250 mg twice a day, she tolerated the medications well, but continue have mild to moderate daily pressure headaches,  She also has history of depression, taking Paxil, she moved from [place] to [place] in around 2010, lives at home with her husband, complains of mild memory trouble, word finding difficulties,  REVIEW OF SYSTEMS: Full 14 system review of systems performed and notable only for memory loss, confusion,  frequent headaches, numbness, weakness, dizziness, snoring, restless leg, depression, anxiety, decreased energy, joints pain, joint swelling, cramping muscles, achy muscles, feeling hot, fatigue, hearing loss, ringing ears, cough, snoring, incontinence.  ALLERGIES: No Known Allergies  HOME MEDICATIONS: Current Outpatient Prescriptions  Medication Sig Dispense Refill  . aspirin EC 81 MG tablet Take 81 mg by mouth daily.    . atorvastatin (LIPITOR) 40 MG tablet Take 40 mg by mouth at bedtime.    . Casanthranol-Docusate Sodium (STOOL SOFTENER PLUS PO) Take by mouth.    . Cholecalciferol (DIALYVITE VITAMIN D 5000 PO) Take 1 capsule by mouth daily.    . divalproex (DEPAKOTE) 250 MG DR tablet Take 1 tablet (250 mg total) by mouth 2 (two) times daily. 60 tablet 0  . lisinopril (PRINIVIL,ZESTRIL) 10 MG tablet Take 1 tablet (10 mg total) by mouth daily. 30 tablet 0  . omeprazole (PRILOSEC) 20 MG capsule Take 1 capsule (20 mg total) by mouth daily. 30 capsule 0  . PARoxetine (PAXIL) 20 MG tablet Take 60 mg by mouth at bedtime.    . tiZANidine (ZANAFLEX) 2 MG tablet Take by mouth every 6 (six) hours as needed for muscle spasms.    . vitamin E 100 UNIT capsule Take 400 Units by mouth daily.     No current facility-administered medications for this visit.    PAST MEDICAL HISTORY: Past Medical History  Diagnosis Date  . Hypercholesteremia   . Hypertension   . IBS (irritable bowel syndrome)   . Cancer of left breast   . PONV (postoperative nausea and vomiting)   . Mitral valve prolapse   . GERD (gastroesophageal reflux disease)   . Headache     "weekly" (11/29/2014)  .   Arthritis     "joints" (11/29/2014)  . Osteoarthritis   . Autoimmune disorder     "non-specific"  . Anxiety   . Depression   . Fibromyalgia     "some; not chronic" (11/29/2014)  . Glaucoma of both eyes   . NASH (nonalcoholic steatohepatitis)     PAST SURGICAL HISTORY: Past Surgical History  Procedure Laterality Date  .  Appendectomy  1953  . Abdominal hysterectomy  1980  . Mastectomy Left ~ 2009  . Breast biopsy Left   . Breast lumpectomy Left     FAMILY HISTORY: Family History  Problem Relation Age of Onset  . Lung cancer Mother     SOCIAL HISTORY:  History   Social History  . Marital Status: Married    Spouse Name: N/A  . Number of Children: 1  . Years of Education: 14   Occupational History  . Retired    Social History Main Topics  . Smoking status: Never Smoker   . Smokeless tobacco: Never Used  . Alcohol Use: No  . Drug Use: No  . Sexual Activity: No   Other Topics Concern  . Not on file   Social History Narrative   Lives at home with her husband.   Right-handed.   2 cups caffeine/day.     PHYSICAL EXAM   Filed Vitals:   12/18/14 1142  BP: 153/90  Pulse: 77  Height: 5' 4" (1.626 m)  Weight: 164 lb (74.39 kg)    Not recorded      Body mass index is 28.14 kg/(m^2).  PHYSICAL EXAMNIATION:  Gen: NAD, conversant, well nourised, obese, well groomed                     Cardiovascular: Regular rate rhythm, no peripheral edema, warm, nontender. Eyes: Conjunctivae clear without exudates or hemorrhage Neck: Supple, no carotid bruise. Pulmonary: Clear to auscultation bilaterally   NEUROLOGICAL EXAM:  MENTAL STATUS: Speech:    Speech is normal; fluent and spontaneous with normal comprehension.  Cognition: Mini-Mental Status Examination is 29 out of 30, she missed one out of 3 recalls    The patient is oriented to person, place, and time;     recent and remote memory intact;     language fluent;     normal attention, concentration,     fund of knowledge.  CRANIAL NERVES: CN II: Visual fields are full to confrontation. Fundoscopic exam is normal with sharp discs and no vascular changes. Venous pulsations are present bilaterally. Pupils are 4 mm and briskly reactive to light. Visual acuity is 20/20 bilaterally. CN III, IV, VI: extraocular movement are normal. No  ptosis. CN V: Facial sensation is intact to pinprick in all 3 divisions bilaterally. Corneal responses are intact.  CN VII: Face is symmetric with normal eye closure and smile. CN VIII: Hearing is normal to rubbing fingers CN IX, X: Palate elevates symmetrically. Phonation is normal. CN XI: Head turning and shoulder shrug are intact CN XII: Tongue is midline with normal movements and no atrophy.  MOTOR: There is no pronator drift of out-stretched arms. Muscle bulk and tone are normal. Muscle strength is normal.   Shoulder abduction Shoulder external rotation Elbow flexion Elbow extension Wrist flexion Wrist extension Finger abduction Hip flexion Knee flexion Knee extension Ankle dorsi flexion Ankle plantar flexion  R _0 L _1 5  REFLEXES: Reflexes are 2+ and symmetric at the biceps, triceps, knees, and ankles. Plantar responses are flexor.  SENSORY: Light touch, pinprick, position sense, and vibration sense are intact in fingers and toes.  COORDINATION: Rapid alternating movements and fine finger movements are intact. There is no dysmetria on finger-to-nose and heel-knee-shin. There are no abnormal or extraneous movements.   GAIT/STANCE: Posture is normal. Gait is steady with normal steps, base, arm swing, and turning. Heel and toe walking are normal. Tandem gait is normal.  Romberg is absent.   DIAGNOSTIC DATA (LABS, IMAGING, TESTING) - I reviewed patient records, labs, notes, testing and imaging myself where available.  Lab Results  Component Value Date   WBC 7.2 11/30/2014   HGB 12.0 11/30/2014   HCT 34.3* 11/30/2014   MCV 88.6 11/30/2014   PLT 224 11/30/2014      Component Value Date/Time   NA 137 11/30/2014 0526   K 3.5 11/30/2014 0526   CL 105 11/30/2014 0526   CO2 29 11/30/2014 0526   GLUCOSE 128* 11/30/2014 0526   BUN 14 11/30/2014 0526   CREATININE 0.77 11/30/2014 0526   CALCIUM 8.9 11/30/2014 0526   PROT 6.8  11/30/2014 0526   ALBUMIN 3.6 11/30/2014 0526   AST 32 11/30/2014 0526   ALT 42* 11/30/2014 0526   ALKPHOS 96 11/30/2014 0526   BILITOT 0.8 11/30/2014 0526   GFRNONAA 84* 11/30/2014 0526   GFRAA >90 11/30/2014 0526   No results found for: CHOL, HDL, LDLCALC, LDLDIRECT, TRIG, CHOLHDL Lab Results  Component Value Date   HGBA1C 6.1* 11/30/2014   No results found for: VITAMINB12 Lab Results  Component Value Date   TSH 0.988 11/29/2014      ASSESSMENT AND PLAN  Rinda Hataway is a 68 y.o. female  with history of depression, migraine headaches, presenting with frequent headaches, MRI of the brain showed moderate small vessel disease, Mini-Mental Status Examination 29 out of 30, she complains of short-term memory trouble, word finding difficulties,  1, migraine, change preventive medications to Depakote ER 500 mg once a day, prescription of 90 day supply was given 2, cambia as needed as abortive treatment  3. ESR C-reactive protein to rule out temporal arteritis 4, return to clinic in one month  Yijun Yan, M.D. Ph.D.  [facility] [address], [address] [place], [place] [address] Ph: [phone] Fax: [phone] 

## 2014-12-19 ENCOUNTER — Telehealth: Payer: Self-pay | Admitting: *Deleted

## 2014-12-19 LAB — C-REACTIVE PROTEIN: CRP: 0.9 mg/L (ref 0.0–4.9)

## 2014-12-19 LAB — SEDIMENTATION RATE: Sed Rate: 17 mm/hr (ref 0–40)

## 2014-12-19 LAB — ANA W/REFLEX IF POSITIVE: ANA: NEGATIVE

## 2014-12-19 NOTE — Telephone Encounter (Signed)
Patient calling stating that the pharmacy told her that the Cathren Harsh is not on her formulary. The patient would need something else called in. Please advise.

## 2014-12-20 DIAGNOSIS — G4761 Periodic limb movement disorder: Secondary | ICD-10-CM | POA: Diagnosis not present

## 2014-12-20 DIAGNOSIS — R063 Periodic breathing: Secondary | ICD-10-CM | POA: Diagnosis not present

## 2014-12-20 DIAGNOSIS — G4737 Central sleep apnea in conditions classified elsewhere: Secondary | ICD-10-CM | POA: Diagnosis not present

## 2014-12-20 DIAGNOSIS — G4733 Obstructive sleep apnea (adult) (pediatric): Secondary | ICD-10-CM | POA: Diagnosis not present

## 2014-12-21 MED ORDER — SUMATRIPTAN SUCCINATE 25 MG PO TABS: 25.0000 mg | ORAL_TABLET | ORAL | Status: DC | PRN

## 2014-12-21 NOTE — Telephone Encounter (Signed)
Chart reviewed, most recent normal cardiac workup, moderate to severe headaches, consistent with migraine,   Michelle: Please let patient know, I have called in low-dose Imitrex 25 mg as needed

## 2014-12-21 NOTE — Telephone Encounter (Signed)
Patient aware of new rx - educated about new med.

## 2014-12-25 ENCOUNTER — Telehealth: Payer: Self-pay

## 2014-12-25 NOTE — Telephone Encounter (Signed)
[facility] has approved the request for coverage on Cambia effective until 10/20/2015 Ref # [id number] (Max quantity allowed is 9 per 30 days)

## 2015-01-01 ENCOUNTER — Ambulatory Visit: Payer: Medicare Other | Admitting: Cardiovascular Disease

## 2015-01-23 ENCOUNTER — Encounter: Payer: Self-pay | Admitting: Neurology

## 2015-01-23 ENCOUNTER — Telehealth: Payer: Self-pay | Admitting: Neurology

## 2015-01-23 ENCOUNTER — Ambulatory Visit (INDEPENDENT_AMBULATORY_CARE_PROVIDER_SITE_OTHER): Payer: Medicare Other | Admitting: Neurology

## 2015-01-23 VITALS — BP 150/84 | HR 72 | Ht 64.0 in | Wt 166.0 lb

## 2015-01-23 DIAGNOSIS — I679 Cerebrovascular disease, unspecified: Secondary | ICD-10-CM

## 2015-01-23 DIAGNOSIS — G43009 Migraine without aura, not intractable, without status migrainosus: Secondary | ICD-10-CM

## 2015-01-23 MED ORDER — DICLOFENAC POTASSIUM(MIGRAINE) 50 MG PO PACK: 50.0000 mg | PACK | ORAL | Status: DC | PRN

## 2015-01-23 MED ORDER — SUMATRIPTAN SUCCINATE 25 MG PO TABS: 25.0000 mg | ORAL_TABLET | ORAL | Status: DC | PRN

## 2015-01-23 NOTE — Patient Instructions (Signed)
Magnesium oxide 400 mg twice a day Riboflavin 100 mg twice a day  Decreased frequent Tylenol use

## 2015-01-23 NOTE — Telephone Encounter (Signed)
Although we were able to get a prior auth approval on Cambia, patient states her brand name co-pay for this drug is very expensive.  (Patient is unable to use discount card because she has Medicare)  She is requesting a drug change to a generic medication.  Please advise.  Thank you.

## 2015-01-23 NOTE — Telephone Encounter (Signed)
Patient stated out of pocket expense for Rx Diclofenac Potassium (CAMBIA) 50 MG PACK would be 355.00 for 9 tablets.  Patient can't afford the cost and questioning if there's alternative medication?  Please call and advise.

## 2015-01-23 NOTE — Progress Notes (Signed)
PATIENT: Autumn Conley DOB: 1946/04/29  HISTORICAL  Autumn Conley is a 69 yo RH female, is referred by her primary care Dr. Nena Jordan, IBTEHAL, accompanied by her daughter Autumn Conley for evaluation of headaches  She had a history of hypertension, hyperlipidemia, depression, left breast cancer, status post left lobectomy, bilateral glaucoma, was found during routine examination, receiving eyedrops.  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  In November 29 2014, she presented to the emergency room for 2 weeks history of persistent headaches, chest pain, had extensive evaluations,  I have reviewed MRI brain in Dec 01 2014: . No acute intracranial abnormality, moderate small vessel disease  Laboratory in February twelfth 2016, mild elevated A1c 6.1, normal TSH, CBC, CMP.  Normal ultrasound of carotid artery, and echocardiogram December 02 2014.  She was put on Depakote IR 250 mg twice a day, she tolerated the medications well, but continue have mild to moderate daily pressure headaches,  She also has history of depression, taking Paxil, she moved from [facility] to [place] in around 2010, lives at home with her husband, complains of mild memory trouble, word finding difficulties  UPDATE April 5th 2016: She tried imitrex 4 times, which did help her headaches, she has 20 headaches in one  month, 12 severe headaches upon waking up, she recently started CPAP machine,   She has been taking frequent Tylenol, 1-3 tablets each day, reported a history of nonalcoholic fatty liver,  REVIEW OF SYSTEMS: Full 14 system review of systems performed and notable only for cough, memory loss, headaches, confusion, anxiety, neck pain, apnea  ALLERGIES: No Known Allergies  HOME MEDICATIONS: Current Outpatient Prescriptions  Medication Sig Dispense Refill  . aspirin EC 81 MG tablet Take 81 mg by mouth daily.    Marland Kitchen atorvastatin (LIPITOR) 40 MG tablet Take 40 mg by mouth at bedtime.    Autumn Conley Sodium (STOOL SOFTENER PLUS PO) Take by mouth.    . divalproex (DEPAKOTE ER) 500 MG 24 hr tablet Take 1 tablet (500 mg total) by mouth daily. 90 tablet 3  . divalproex (DEPAKOTE) 250 MG DR tablet Take 1 tablet (250 mg total) by mouth 2 (two) times daily. 60 tablet 0  . lisinopril (PRINIVIL,ZESTRIL) 10 MG tablet Take 1 tablet (10 mg total) by mouth daily. 30 tablet 0  . omeprazole (PRILOSEC) 20 MG capsule Take 1 capsule (20 mg total) by mouth daily. 30 capsule 0  . PARoxetine (PAXIL) 20 MG tablet Take 60 mg by mouth at bedtime.    . SUMAtriptan (IMITREX) 25 MG tablet Take 1 tablet (25 mg total) by mouth every 2 (two) hours as needed for migraine. May repeat in 2 hours if headache persists or recurs. 10 tablet 3  . timolol (BETIMOL) 0.5 % ophthalmic solution 1 drop daily.    Marland Kitchen tiZANidine (ZANAFLEX) 2 MG tablet Take by mouth every 6 (six) hours as needed for muscle spasms.    . vitamin E 100 UNIT capsule  Take 400 Units by mouth daily.     No current facility-administered medications for this visit.    PAST MEDICAL HISTORY: Past Medical History  Diagnosis Date  . Hypercholesteremia   . Hypertension   . IBS (irritable bowel syndrome)   . Cancer of left breast   . PONV (postoperative nausea and vomiting)   . Mitral valve prolapse   . GERD (gastroesophageal reflux disease)   . Headache       "weekly" (11/29/2014)  . Arthritis     "joints" (11/29/2014)  . Osteoarthritis   . Autoimmune disorder     "non-specific"  . Anxiety   . Depression   . Fibromyalgia     "some; not chronic" (11/29/2014)  . Glaucoma of both eyes   . NASH (nonalcoholic steatohepatitis)     PAST SURGICAL HISTORY: Past Surgical History  Procedure Laterality Date  . Appendectomy  1953  . Abdominal hysterectomy  1980  . Mastectomy Left ~ 2009  . Breast biopsy Left   . Breast lumpectomy Left     FAMILY HISTORY: Family History  Problem Relation Age of Onset  . Lung cancer Mother     SOCIAL HISTORY:  History   Social History  . Marital Status: Married    Spouse Name: N/A  . Number of Children: 1  . Years of Education: 14   Occupational History  . Retired    Social History Main Topics  . Smoking status: Never Smoker   . Smokeless tobacco: Never Used  . Alcohol Use: No  . Drug Use: No  . Sexual Activity: No   Other Topics Concern  . Not on file   Social History Narrative   Lives at home with her husband.   Right-handed.   2 cups caffeine/day.     PHYSICAL EXAM   Filed Vitals:   01/23/15 1105  BP: 150/84  Pulse: 72  Height: 5' 4"  (1.626 m)  Weight: 166 lb (75.297 kg)    Not recorded      Body mass index is 28.48 kg/(m^2).  PHYSICAL EXAMNIATION:  Gen: NAD, conversant, well nourised, obese, well groomed                     Cardiovascular: Regular rate rhythm, no peripheral edema, warm, nontender. Eyes: Conjunctivae clear without exudates or hemorrhage Neck: Supple, no carotid bruise. Pulmonary: Clear to auscultation bilaterally   NEUROLOGICAL EXAM:  MENTAL STATUS: Speech:    Speech is normal; fluent and spontaneous with normal comprehension.  Cognition: Mini-Mental Status Examination is 29 out of 30, she missed one out of 3 recalls    The patient is oriented to person, place, and time;     recent and remote memory intact;     language fluent;     normal  attention, concentration,     fund of knowledge.  CRANIAL NERVES: CN II: Visual fields are full to confrontation. Fundoscopic exam is normal with sharp discs and no vascular changes. Venous pulsations are present bilaterally. Pupils are 4 mm and briskly reactive to light. Visual acuity is 20/20 bilaterally. CN III, IV, VI: extraocular movement are normal. No ptosis. CN V: Facial sensation is intact to pinprick in all 3 divisions bilaterally. Corneal responses are intact.  CN VII: Face is symmetric with normal eye closure and smile. CN VIII: Hearing is normal to rubbing fingers CN IX, X: Palate elevates symmetrically. Phonation is normal. CN XI: Head turning and shoulder shrug are intact CN XII: Tongue is  midline with normal movements and no atrophy.  MOTOR: There is no pronator drift of out-stretched arms. Muscle bulk and tone are normal. Muscle strength is normal.   Shoulder abduction Shoulder external rotation Elbow flexion Elbow extension Wrist flexion Wrist extension Finger abduction Hip flexion Knee flexion Knee extension Ankle dorsi flexion Ankle plantar flexion  R 5 5 5 5 5 5 5 5 5 5 5 5   L 5 5 5 5 5 5 5 5 5 5 5 5     REFLEXES: Reflexes are 2+ and symmetric at the biceps, triceps, knees, and ankles. Plantar responses are flexor.  SENSORY: Light touch, pinprick, position sense, and vibration sense are intact in fingers and toes.  COORDINATION: Rapid alternating movements and fine finger movements are intact. There is no dysmetria on finger-to-nose and heel-knee-shin. There are no abnormal or extraneous movements.   GAIT/STANCE: Posture is normal. Gait is steady with normal steps, base, arm swing, and turning. Heel and toe walking are normal. Tandem gait is normal.  Romberg is absent.   DIAGNOSTIC DATA (LABS, IMAGING, TESTING) - I reviewed patient records, labs, notes, testing and imaging myself where available.  Lab Results  Component Value Date   WBC 7.2 11/30/2014    HGB 12.0 11/30/2014   HCT 34.3* 11/30/2014   MCV 88.6 11/30/2014   PLT 224 11/30/2014      Component Value Date/Time   NA 137 11/30/2014 0526   K 3.5 11/30/2014 0526   CL 105 11/30/2014 0526   CO2 29 11/30/2014 0526   GLUCOSE 128* 11/30/2014 0526   BUN 14 11/30/2014 0526   CREATININE 0.77 11/30/2014 0526   CALCIUM 8.9 11/30/2014 0526   PROT 6.8 11/30/2014 0526   ALBUMIN 3.6 11/30/2014 0526   AST 32 11/30/2014 0526   ALT 42* 11/30/2014 0526   ALKPHOS 96 11/30/2014 0526   BILITOT 0.8 11/30/2014 0526   GFRNONAA 84* 11/30/2014 0526   GFRAA >90 11/30/2014 0526   No results found for: CHOL, HDL, LDLCALC, LDLDIRECT, TRIG, CHOLHDL Lab Results  Component Value Date   HGBA1C 6.1* 11/30/2014   No results found for: VITAMINB12 Lab Results  Component Value Date   TSH 0.988 11/29/2014      ASSESSMENT AND PLAN  Marena Witts is a 69 y.o. female  with history of depression, migraine headaches, presenting with frequent headaches, MRI of the brain showed moderate small vessel disease,   1, migraine, keep preventive medications to Depakote ER 500 mg once a day, prescription of 90 day supply was given 2, cambia, Imitrex 25 mg as needed as abortive treatment  3.  May also consider add on magnesium oxide 400 mg twice a day, riboflavin 100 mg twice a day as preventive medications  Marcial Pacas, M.D. Ph.D.  [facility] [address], [place], [place] [address] Ph: [phone] Fax: [phone]

## 2015-01-24 NOTE — Telephone Encounter (Signed)
Michelle: Please call patients, it is okay for her to take Imitrex 25 mg as needed for migraine, may combine with NSAIDs, such as ibuprofen/Aleve as needed, it is okay to hold off refill her Cambia this point

## 2015-01-24 NOTE — Telephone Encounter (Signed)
Patient aware of plan and agreeable to Dr. Rhea Belton orders.

## 2015-01-25 DIAGNOSIS — I10 Essential (primary) hypertension: Secondary | ICD-10-CM | POA: Diagnosis not present

## 2015-01-25 DIAGNOSIS — E785 Hyperlipidemia, unspecified: Secondary | ICD-10-CM | POA: Diagnosis not present

## 2015-01-25 DIAGNOSIS — G43009 Migraine without aura, not intractable, without status migrainosus: Secondary | ICD-10-CM | POA: Diagnosis not present

## 2015-01-29 ENCOUNTER — Ambulatory Visit (INDEPENDENT_AMBULATORY_CARE_PROVIDER_SITE_OTHER): Payer: Medicare Other | Admitting: Interventional Cardiology

## 2015-01-29 VITALS — BP 152/88 | HR 70 | Ht 64.0 in | Wt 159.0 lb

## 2015-01-29 DIAGNOSIS — R0789 Other chest pain: Secondary | ICD-10-CM

## 2015-01-29 DIAGNOSIS — Z711 Person with feared health complaint in whom no diagnosis is made: Secondary | ICD-10-CM | POA: Diagnosis not present

## 2015-01-29 DIAGNOSIS — I10 Essential (primary) hypertension: Secondary | ICD-10-CM

## 2015-01-29 DIAGNOSIS — E785 Hyperlipidemia, unspecified: Secondary | ICD-10-CM | POA: Diagnosis not present

## 2015-01-29 DIAGNOSIS — Z789 Other specified health status: Secondary | ICD-10-CM

## 2015-01-29 NOTE — Patient Instructions (Signed)
Your physician recommends that you continue on your current medications as directed. Please refer to the Current Medication list given to you today.   Your physician recommends that you schedule a follow-up appointment as needed  

## 2015-01-29 NOTE — Progress Notes (Signed)
Cardiology Office Note   Date:  01/29/2015   ID:  Autumn Conley, DOB 26-Mar-1946, MRN [id number]  PCP:  Ileana Roup, MD  Cardiologist:   Sinclair Grooms, MD   No chief complaint on file.     History of Present Illness: Autumn Conley is a 69 y.o. female who presents for concerns about her EKG. She had a recent hospitalization for headache and chest pain. She ruled out for myocardial infarction. She was found to have hypokalemia. She has been started on Imitrex for headaches. Her headaches have improved. She has had no recurrence of chest pain since the hospital discharge. She was found to have hypokalemia that was treated. Myocardial perfusion study with Lexiscan pharmacologic stress was low risk although there were ST segment changes suggestive of ischemia. Upon discharge from the hospital she was told that she needed to follow-up with the cardiologists even though the myocardial images were low risk. She was confused by this and requested to see me.    Past Medical History  Diagnosis Date  . Hypercholesteremia   . Hypertension   . IBS (irritable bowel syndrome)   . Cancer of left breast   . PONV (postoperative nausea and vomiting)   . Mitral valve prolapse   . GERD (gastroesophageal reflux disease)   . Headache     "weekly" (11/29/2014)  . Arthritis     "joints" (11/29/2014)  . Osteoarthritis   . Autoimmune disorder     "non-specific"  . Anxiety   . Depression   . Fibromyalgia     "some; not chronic" (11/29/2014)  . Glaucoma of both eyes   . NASH (nonalcoholic steatohepatitis)     Past Surgical History  Procedure Laterality Date  . Appendectomy  1953  . Abdominal hysterectomy  1980  . Mastectomy Left ~ 2009  . Breast biopsy Left   . Breast lumpectomy Left      Current Outpatient Prescriptions  Medication Sig Dispense Refill  . aspirin EC 81 MG tablet Take 81 mg by mouth daily.    Marland Kitchen atorvastatin (LIPITOR) 40 MG tablet Take 40 mg by mouth at bedtime.      Sarajane Marek Sodium (STOOL SOFTENER PLUS PO) Take 1 tablet by mouth as needed (for constipation).     Marland Kitchen divalproex (DEPAKOTE) 250 MG DR tablet Take 1 tablet (250 mg total) by mouth 2 (two) times daily. 60 tablet 0  . lisinopril (PRINIVIL,ZESTRIL) 10 MG tablet Take 1 tablet (10 mg total) by mouth daily. 30 tablet 0  . PARoxetine (PAXIL) 20 MG tablet Take 60 mg by mouth at bedtime.    . SUMAtriptan (IMITREX) 25 MG tablet Take 1 tablet (25 mg total) by mouth every 2 (two) hours as needed for migraine. May repeat in 2 hours if headache persists or recurs. 15 tablet 11  . timolol (BETIMOL) 0.5 % ophthalmic solution 1 drop daily.    Marland Kitchen tiZANidine (ZANAFLEX) 2 MG tablet Take by mouth every 6 (six) hours as needed for muscle spasms.    . vitamin E 100 UNIT capsule Take 400 Units by mouth daily.     No current facility-administered medications for this visit.    Allergies:   Review of patient's allergies indicates no known allergies.    Social History:  The patient  reports that she has never smoked. She has never used smokeless tobacco. She reports that she does not drink alcohol or use illicit drugs.   Family History:  The patient's family history includes  Lung cancer in her mother; Prostate cancer in her brother.    ROS:  Please see the history of present illness.   Otherwise, review of systems are positive for headache which is now better controlled..   All other systems are reviewed and negative.    PHYSICAL EXAM: VS:  BP 152/88 mmHg  Pulse 70  Ht 5' 4"  (1.626 m)  Wt 159 lb (72.122 kg)  BMI 27.28 kg/m2  SpO2 97% , BMI Body mass index is 27.28 kg/(m^2). GEN: Well nourished, well developed, in no acute distress HEENT: normal Neck: no JVD, carotid bruits, or masses Cardiac: RRR; no murmurs, rubs, or gallops,no edema  Respiratory:  clear to auscultation bilaterally, normal work of breathing GI: soft, nontender, nondistended, + BS MS: no deformity or atrophy Skin: warm and  dry, no rash Neuro:  Strength and sensation are intact Psych: euthymic mood, full affect   EKG:  EKG is not ordered today. The ekg was not performed today and demonstrates no abnormality one performed in February.   Recent Labs: 11/29/2014: TSH 0.988 11/30/2014: ALT 42*; BUN 14; Creatinine 0.77; Hemoglobin 12.0; Platelets 224; Potassium 3.5; Sodium 137    Lipid Panel No results found for: CHOL, TRIG, HDL, CHOLHDL, VLDL, LDLCALC, LDLDIRECT    Wt Readings from Last 3 Encounters:  01/29/15 159 lb (72.122 kg)  01/23/15 166 lb (75.297 kg)  12/18/14 164 lb (74.39 kg)      Other studies Reviewed: Additional studies/ records that were reviewed today include: . Review of the above records demonstrates: Negative cardiac workup including negative markers for injury, no EKG  changes at rest, no episodes of recurrent chest pain, and a low risk myocardial perfusion study.   ASSESSMENT AND PLAN:  Essential hypertension: Excellent control  Hyperlipidemia: On therapy and followed by primary care  Other chest pain: Chest pain in setting of hypokalemia. Low risk myocardial perfusion study. There were stress-induced EKG changes that represent a false positive response to myocardial stress. All of this occurred in the setting of hypokalemia.  False positive stress EKG response    Current medicines are reviewed at length with the patient today.  The patient does not have concerns regarding medicines.  The following changes have been made:  no change. I reassured the patient concerning her recent hospitalization with reference to coronary artery disease. Even though her negative workup is well documented, her only concern was that someone told her that there were EKG abnormalities and that she needed to follow-up with a cardiologist. As best I can tell, this concern was only raise on the [place] induced myocardial stress EKGs. This represented a false positive EKG response to stress.  Labs/  tests ordered today include:  No orders of the defined types were placed in this encounter.     Disposition:   FU with Linard Millers in as needed.  Signed, Sinclair Grooms, MD  01/29/2015 1:01 PM    [facility] [place], [place], [place]  [address] Phone: [phone]; Fax: [phone]

## 2015-03-22 DIAGNOSIS — G4733 Obstructive sleep apnea (adult) (pediatric): Secondary | ICD-10-CM | POA: Diagnosis not present

## 2015-03-23 DIAGNOSIS — M25571 Pain in right ankle and joints of right foot: Secondary | ICD-10-CM | POA: Diagnosis not present

## 2015-03-23 DIAGNOSIS — M79641 Pain in right hand: Secondary | ICD-10-CM | POA: Diagnosis not present

## 2015-03-23 DIAGNOSIS — M25561 Pain in right knee: Secondary | ICD-10-CM | POA: Diagnosis not present

## 2015-03-23 DIAGNOSIS — R5381 Other malaise: Secondary | ICD-10-CM | POA: Diagnosis not present

## 2015-03-23 DIAGNOSIS — Z79899 Other long term (current) drug therapy: Secondary | ICD-10-CM | POA: Diagnosis not present

## 2015-03-23 DIAGNOSIS — M25562 Pain in left knee: Secondary | ICD-10-CM | POA: Diagnosis not present

## 2015-03-23 DIAGNOSIS — R300 Dysuria: Secondary | ICD-10-CM | POA: Diagnosis not present

## 2015-03-23 DIAGNOSIS — M79642 Pain in left hand: Secondary | ICD-10-CM | POA: Diagnosis not present

## 2015-03-23 DIAGNOSIS — M2550 Pain in unspecified joint: Secondary | ICD-10-CM | POA: Diagnosis not present

## 2015-03-23 DIAGNOSIS — E559 Vitamin D deficiency, unspecified: Secondary | ICD-10-CM | POA: Diagnosis not present

## 2015-03-23 DIAGNOSIS — M25572 Pain in left ankle and joints of left foot: Secondary | ICD-10-CM | POA: Diagnosis not present

## 2015-04-30 ENCOUNTER — Ambulatory Visit: Payer: Medicare Other | Admitting: Neurology

## 2015-04-30 DIAGNOSIS — M7072 Other bursitis of hip, left hip: Secondary | ICD-10-CM | POA: Diagnosis not present

## 2015-04-30 DIAGNOSIS — M170 Bilateral primary osteoarthritis of knee: Secondary | ICD-10-CM | POA: Diagnosis not present

## 2015-04-30 DIAGNOSIS — M7552 Bursitis of left shoulder: Secondary | ICD-10-CM | POA: Diagnosis not present

## 2015-04-30 DIAGNOSIS — M19041 Primary osteoarthritis, right hand: Secondary | ICD-10-CM | POA: Diagnosis not present

## 2015-05-01 ENCOUNTER — Encounter: Payer: Self-pay | Admitting: Neurology

## 2015-05-01 ENCOUNTER — Ambulatory Visit (INDEPENDENT_AMBULATORY_CARE_PROVIDER_SITE_OTHER): Payer: Medicare Other | Admitting: Neurology

## 2015-05-01 ENCOUNTER — Ambulatory Visit: Payer: Medicare Other | Admitting: Neurology

## 2015-05-01 VITALS — BP 146/90 | HR 77 | Ht 64.0 in | Wt 172.0 lb

## 2015-05-01 DIAGNOSIS — I679 Cerebrovascular disease, unspecified: Secondary | ICD-10-CM

## 2015-05-01 DIAGNOSIS — G43009 Migraine without aura, not intractable, without status migrainosus: Secondary | ICD-10-CM | POA: Diagnosis not present

## 2015-05-01 MED ORDER — SUMATRIPTAN SUCCINATE 50 MG PO TABS: 50.0000 mg | ORAL_TABLET | ORAL | Status: DC | PRN

## 2015-05-01 MED ORDER — NORTRIPTYLINE HCL 10 MG PO CAPS: ORAL_CAPSULE | ORAL | Status: DC

## 2015-05-01 MED ORDER — NORTRIPTYLINE HCL 10 MG PO CAPS
ORAL_CAPSULE | ORAL | Status: DC
Start: 2015-05-01 — End: 2015-05-01

## 2015-05-01 NOTE — [place] Notes (Signed)
Chief Complaint  Patient presents with  . Migraine    She is here with her husband, Jeneen Rinks.  Feels migraines have improved with current medications.  She does have some mild headaches that she is unsure how to treat since NSAIDS are unhealthy for her fatty liver.  She has brought a headache record today showing the use of Imitrex 5 times in April, 6 times in May and 6 times in June.      PATIENT: Autumn Conley DOB: [birth year]  HISTORICAL  Autumn Conley is a 69 yo RH female, is referred by her primary care Dr. Nena Jordan, IBTEHAL, accompanied by her daughter [name] for evaluation of headaches  She had a history of hypertension, hyperlipidemia, depression, left breast cancer, status post left lobectomy, bilateral glaucoma, was found during routine examination, receiving eyedrops.  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  In November 29 2014, she presented to the emergency room for 2 weeks history of persistent headaches, chest pain, had extensive evaluations,  I have reviewed MRI brain in Dec 01 2014: . No acute intracranial abnormality, moderate small vessel disease  Laboratory in February 12th 2016, mild elevated A1c 6.1, normal TSH, CBC, CMP.  Normal ultrasound of carotid artery, and echocardiogram December 02 2014.  She was put on Depakote IR 250 mg twice a day, she tolerated the medications well,  but continue have mild to moderate daily pressure headaches,  She also has history of depression, taking Paxil, she moved from [facility] to [place] in around 2010, lives at home with her husband, complains of mild memory trouble, word finding difficulties  UPDATE April 5th 2016: She tried imitrex 4 times, which did help her headaches, she has 20 headaches in one month, 12 severe headaches upon waking up, she recently started CPAP machine,   She has been taking frequent Tylenol, 1-3 tablets each day, reported a history of nonalcoholic fatty liver  UPDATE May 01 2015: She is with her husband at today's visit, she still has 2 headache a week, she still takes goody powder prn, but much less frequent, she uses cold compression,   She used imitrex 25 mg as needed, which was helpful, but sometimes she requires second dosage, she tried one time of 50 mg tablets of Imitrex, seems to work better, no significant side effect noticed.  She was not sure the benefit of Depakote as headache prevention, does reported 10 pound weight gain over the past 3 months  REVIEW OF SYSTEMS: Full 14 system review of systems performed and notable only for memory loss, frequent headaches   ALLERGIES: No Known Allergies  HOME MEDICATIONS: Current Outpatient Prescriptions  Medication Sig Dispense Refill  . aspirin EC 81 MG tablet Take 81 mg by mouth daily.    Marland Kitchen atorvastatin (LIPITOR) 40 MG tablet Take 40 mg by mouth at bedtime.    Sarajane Marek Sodium (STOOL SOFTENER PLUS PO) Take 1 tablet by mouth as needed (for constipation).     Marland Kitchen  divalproex (DEPAKOTE) 250 MG DR tablet Take 1 tablet (250 mg total) by mouth 2 (two) times daily. 60 tablet 0  . losartan (COZAAR) 25 MG tablet Take 25 mg by mouth daily.  6  . Magnesium 400 MG CAPS Take by mouth 2 (two) times daily.    Marland Kitchen PARoxetine (PAXIL) 20 MG tablet Take 60 mg by mouth at bedtime.    . Riboflavin 100 MG CAPS Take by mouth 2 (two) times  daily.    . SUMAtriptan (IMITREX) 25 MG tablet Take 1 tablet (25 mg total) by mouth every 2 (two) hours as needed for migraine. May repeat in 2 hours if headache persists or recurs. 15 tablet 11  . timolol (BETIMOL) 0.5 % ophthalmic solution 1 drop daily.    Marland Kitchen tiZANidine (ZANAFLEX) 2 MG tablet Take by mouth every 6 (six) hours as needed for muscle spasms.    . Vitamin D, Ergocalciferol, (DRISDOL) 50000 UNITS CAPS capsule Take 50,000 Units by mouth once a week.  0  . vitamin E 100 UNIT capsule Take 400 Units by mouth daily.     No current facility-administered medications for this visit.    PAST MEDICAL HISTORY: Past Medical History  Diagnosis Date  . Hypercholesteremia   . Hypertension   . IBS (irritable bowel syndrome)   . Cancer of left breast   . PONV (postoperative nausea and vomiting)   . Mitral valve prolapse   . GERD (gastroesophageal reflux disease)   . Headache     "weekly" (11/29/2014)  . Arthritis     "joints" (11/29/2014)  . Osteoarthritis   . Autoimmune disorder     "non-specific"  . Anxiety   . Depression   . Fibromyalgia     "some; not chronic" (11/29/2014)  . Glaucoma of both eyes   . NASH (nonalcoholic steatohepatitis)     PAST SURGICAL HISTORY: Past Surgical History  Procedure Laterality Date  . Appendectomy  1953  . Abdominal hysterectomy  1980  . Mastectomy Left ~ 2009  . Breast biopsy Left   . Breast lumpectomy Left     FAMILY HISTORY: Family History  Problem Relation Age of Onset  . Lung cancer Mother   . Prostate cancer Brother     SOCIAL HISTORY:  History   Social History  . Marital Status: Married    Spouse Name: N/A  . Number of Children: 1  . Years of Education: 14   Occupational History  . Retired    Social History Main Topics  . Smoking status: Never Smoker   . Smokeless tobacco: Never Used  . Alcohol Use: No  . Drug Use: No  . Sexual Activity: No   Other Topics Concern  . Not on file   Social History Narrative    Lives at home with her husband.   Right-handed.   2 cups caffeine/day.     PHYSICAL EXAM   Filed Vitals:   05/01/15 1356  BP: 146/90  Pulse: 77  Height: 5' 4"  (1.626 m)  Weight: 172 lb (78.019 kg)    Not recorded      Body mass index is 29.51 kg/(m^2).  PHYSICAL EXAMNIATION:  Gen: NAD, conversant, well nourised, obese, well groomed                     Cardiovascular: Regular rate rhythm, no peripheral edema, warm, nontender. Eyes: Conjunctivae clear without exudates or hemorrhage Neck: Supple, no carotid bruise. Pulmonary: Clear to auscultation bilaterally   NEUROLOGICAL EXAM:  MENTAL STATUS: Speech:    Speech is normal; fluent and spontaneous with normal comprehension.  Cognition: Mini-Mental Status Examination is 29 out of 30, she missed one out of 3 recalls    The patient is oriented to person, place, and time;     recent and remote memory intact;     language fluent;     normal attention, concentration,     fund of knowledge.  CRANIAL NERVES: CN II: Visual fields are full to confrontation. Fundoscopic exam is normal with sharp discs and no vascular changes. Venous pulsations are present bilaterally. Pupils are 4 mm and briskly reactive to light. Visual acuity is 20/20 bilaterally. CN III, IV, VI: extraocular movement are normal. No ptosis. CN V: Facial sensation is intact to pinprick in all 3 divisions bilaterally. Corneal responses are intact.  CN VII: Face is symmetric with normal eye closure and smile. CN VIII: Hearing is normal to rubbing fingers CN IX, X: Palate elevates symmetrically. Phonation is normal. CN XI: Head turning and shoulder shrug are intact CN XII: Tongue is midline with normal movements and no atrophy.  MOTOR: There is no pronator drift of out-stretched arms. Muscle bulk and tone are normal. Muscle strength is normal.  REFLEXES: Reflexes are 2+ and symmetric at the biceps, triceps, knees, and ankles. Plantar responses are  flexor.  SENSORY: Light touch, pinprick, position sense, and vibration sense are intact in fingers and toes.  COORDINATION: Rapid alternating movements and fine finger movements are intact. There is no dysmetria on finger-to-nose and heel-knee-shin. There are no abnormal or extraneous movements.   GAIT/STANCE: Posture is normal. Gait is steady with normal steps, base, arm swing, and turning. Heel and toe walking are normal. Tandem gait is normal.  Romberg is absent.   DIAGNOSTIC DATA (LABS, IMAGING, TESTING) - I reviewed patient records, labs, notes, testing and imaging myself where available.  Lab Results  Component Value Date   WBC 7.2 11/30/2014   HGB 12.0 11/30/2014   HCT 34.3* 11/30/2014   MCV 88.6 11/30/2014   PLT 224 11/30/2014      Component Value Date/Time   NA 137 11/30/2014 0526   K 3.5 11/30/2014 0526   CL 105 11/30/2014 0526   CO2 29 11/30/2014 0526   GLUCOSE 128* 11/30/2014 0526   BUN 14 11/30/2014 0526   CREATININE 0.77 11/30/2014 0526   CALCIUM 8.9 11/30/2014 0526   PROT 6.8 11/30/2014 0526   ALBUMIN 3.6 11/30/2014 0526   AST 32 11/30/2014 0526   ALT 42* 11/30/2014 0526   ALKPHOS 96 11/30/2014 0526   BILITOT 0.8 11/30/2014 0526   GFRNONAA 84* 11/30/2014 0526   GFRAA >90 11/30/2014 0526   No results found for: CHOL, HDL, LDLCALC, LDLDIRECT, TRIG, CHOLHDL Lab Results  Component Value Date   HGBA1C 6.1* 11/30/2014   No results found for: VITAMINB12 Lab Results  Component Value Date   TSH 0.988 11/29/2014    ASSESSMENT AND PLAN  Autumn Conley is a 69 y.o. female  with history of depression, migraine headaches, presenting with frequent headaches, MRI of the brain showed moderate small vessel disease,   1, migraine, not benefit from Depakote, will try nortriptyline 10 mg, titrating to 20 mg every night 2, Imitrex 50 mg as needed for abortive treatment  Marcial Pacas, M.D. Ph.D.  [facility] [address], [place],  [place] [address] Ph: [phone] Fax: [phone]

## 2015-08-01 ENCOUNTER — Encounter: Payer: Self-pay | Admitting: Nurse Practitioner

## 2015-08-01 ENCOUNTER — Ambulatory Visit (INDEPENDENT_AMBULATORY_CARE_PROVIDER_SITE_OTHER): Payer: Medicare Other | Admitting: Nurse Practitioner

## 2015-08-01 VITALS — BP 133/80 | HR 83 | Ht 64.0 in | Wt 168.6 lb

## 2015-08-01 DIAGNOSIS — G43009 Migraine without aura, not intractable, without status migrainosus: Secondary | ICD-10-CM

## 2015-08-01 MED ORDER — NORTRIPTYLINE HCL 10 MG PO CAPS: ORAL_CAPSULE | ORAL | Status: DC

## 2015-08-01 NOTE — [place] Notes (Signed)
[facility]  PATIENT: Autumn Conley DOB: 1946/04/06   REASON FOR VISIT: Small vessel disease, migraine HISTORY FROM: Patient    HISTORY OF PRESENT ILLNESS:Autumn Conley is a 69 yo RH female, is referred by her primary care Dr. Nena Jordan, IBTEHAL, accompanied by her daughter Autumn Conley for evaluation of headaches  She had a history of hypertension, hyperlipidemia, depression, left breast cancer, status post left lobectomy, bilateral glaucoma, was found during routine examination, receiving eyedrops.  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  In November 29 2014, she presented to the emergency room for 2 weeks history of persistent headaches, chest pain, had extensive evaluations,  I have reviewed MRI brain in Dec 01 2014: . No acute intracranial abnormality, moderate small vessel disease  Laboratory in February 12th 2016, mild elevated A1c 6.1, normal TSH, CBC, CMP.  Normal ultrasound of carotid artery, and echocardiogram December 02 2014.  She was put on Depakote IR 250 mg twice a day, she tolerated the medications well, but continue have mild to moderate daily pressure headaches,  She also has history of depression, taking Paxil, she moved from [facility] to [place] in around 2010, lives at home with her husband, complains of mild memory trouble, word finding  difficulties UPDATE May 01 2015:She is with her husband at today's visit, she still has 2 headache a week, she still takes goody powder prn, but much less frequent, she uses cold compression,  She used imitrex 25 mg as needed, which was helpful, but sometimes she requires second dosage, she tried one time of 50 mg tablets of Imitrex, seems to work better, no significant side effect noticed. She was not sure the benefit of Depakote as headache prevention, does reported 10 pound weight gain over the past 3 months UPDATE 08/01/15 Ms. Andres, 69 year old female returns for follow-up. She has a history of migraines and was last seen by Dr. Krista Blue 05/01/2015. At that time her preventive was changed from Depakote to nortriptyline and she is currently taking 20 mg at night. She has had 3 headaches in the last 3 months and is pleased with her response. She continues to use Imitrex acutely. She denies side effects to the medication. She returns for reevaluation   REVIEW OF SYSTEMS: Full 14 system review of systems performed and notable only for those listed, all others are neg:  Constitutional: neg  Cardiovascular: neg Ear/Nose/Throat: neg  Skin: neg Eyes: neg Respiratory: neg Gastroitestinal: neg  Hematology/Lymphatic: neg  Endocrine: neg Musculoskeletal:neg Allergy/Immunology: neg Neurological: neg Psychiatric: neg Sleep : neg   ALLERGIES: No Known Allergies  HOME MEDICATIONS: Outpatient Prescriptions Prior to Visit  Medication Sig Dispense Refill  . aspirin EC 81 MG tablet Take 81 mg by mouth daily.    Marland Kitchen atorvastatin (LIPITOR) 40 MG tablet Take 40 mg by mouth at bedtime.    Sarajane Marek Sodium (STOOL SOFTENER PLUS PO) Take 1 tablet by mouth as needed (for constipation).     Marland Kitchen  losartan (COZAAR) 25 MG tablet Take 25 mg by mouth daily.  6  . Magnesium 400 MG CAPS Take by mouth 2 (two) times daily.    . nortriptyline (PAMELOR) 10 MG capsule One po qhs xone week, then 2 tabs po qhs 60  capsule 2  . PARoxetine (PAXIL) 20 MG tablet Take 60 mg by mouth at bedtime.    . Riboflavin 100 MG CAPS Take by mouth 2 (two) times daily.    . SUMAtriptan (IMITREX) 50 MG tablet Take 1 tablet (50 mg total) by mouth every 2 (two) hours as needed for migraine. May repeat in 2 hours if headache persists or recurs. 27 tablet 3  . timolol (BETIMOL) 0.5 % ophthalmic solution 1 drop daily.    Marland Kitchen tiZANidine (ZANAFLEX) 2 MG tablet Take by mouth every 6 (six) hours as needed for muscle spasms.    . Vitamin D, Ergocalciferol, (DRISDOL) 50000 UNITS CAPS capsule Take 50,000 Units by mouth once a week.  0  . vitamin E 100 UNIT capsule Take 400 Units by mouth daily.     No facility-administered medications prior to visit.    PAST MEDICAL HISTORY: Past Medical History  Diagnosis Date  . Hypercholesteremia   . Hypertension   . IBS (irritable bowel syndrome)   . Cancer of left breast ([place])   . PONV (postoperative nausea and vomiting)   . Mitral valve prolapse   . GERD (gastroesophageal reflux disease)   . Headache     "weekly" (11/29/2014)  . Arthritis     "joints" (11/29/2014)  . Osteoarthritis   . Autoimmune disorder ([place])     "non-specific"  . Anxiety   . Depression   . Fibromyalgia     "some; not chronic" (11/29/2014)  . Glaucoma of both eyes   . NASH (nonalcoholic steatohepatitis)     PAST SURGICAL HISTORY: Past Surgical History  Procedure Laterality Date  . Appendectomy  1953  . Abdominal hysterectomy  1980  . Mastectomy Left ~ 2009  . Breast biopsy Left   . Breast lumpectomy Left     FAMILY HISTORY: Family History  Problem Relation Age of Onset  . Lung cancer Mother   . Prostate cancer Brother     SOCIAL HISTORY: Social History   Social History  . Marital Status: Married    Spouse Name: N/A  . Number of Children: 1  . Years of Education: 14   Occupational History  . Retired    Social History Main Topics  . Smoking status: Never Smoker   . Smokeless tobacco:  Never Used  . Alcohol Use: No  . Drug Use: No  . Sexual Activity: No   Other Topics Concern  . Not on file   Social History Narrative   Lives at home with her husband.   Right-handed.   2 cups caffeine/day.     PHYSICAL EXAM  Filed Vitals:   08/01/15 1118  BP: 133/80  Pulse: 83  Height: 5' 4"  (1.626 m)  Weight: 168 lb 9.6 oz (76.476 kg)   Body mass index is 28.93 kg/(m^2).  Generalized: Well developed, in no acute distress  Head: normocephalic and atraumatic,. Oropharynx benign  Neck: Supple, no carotid bruits  Cardiac: Regular rate rhythm, no murmur  Musculoskeletal: No deformity   Neurological examination   Mentation: Alert oriented to time, place, history taking. Attention span and concentration appropriate. Recent and remote memory intact.  Follows all commands speech and language fluent.   Cranial nerve II-XII: Pupils were  equal round reactive to light extraocular movements were full, visual field were full on confrontational test. Facial sensation and strength were normal. hearing was intact to finger rubbing bilaterally. Uvula tongue midline. head turning and shoulder shrug were normal and symmetric.Tongue protrusion into cheek strength was normal. Motor: normal bulk and tone, full strength in the BUE, BLE, fine finger movements normal, no pronator drift. No focal weakness Sensory: normal and symmetric to light touch Coordination: finger-nose-finger, heel-to-shin bilaterally, no dysmetria Reflexes: Brachioradialis 2/2, biceps 2/2, triceps 2/2, patellar 2/2, Achilles 2/2, plantar responses were flexor bilaterally. Gait and Station: Rising up from seated position without assistance, normal stance,  moderate stride, good arm swing, smooth turning, able to perform tiptoe, and heel walking without difficulty. Tandem gait is steady  DIAGNOSTIC DATA (LABS, IMAGING, TESTING) - I reviewed patient records, labs, notes, testing and imaging myself where available.  Lab  Results  Component Value Date   WBC 7.2 11/30/2014   HGB 12.0 11/30/2014   HCT 34.3* 11/30/2014   MCV 88.6 11/30/2014   PLT 224 11/30/2014      Component Value Date/Time   NA 137 11/30/2014 0526   K 3.5 11/30/2014 0526   CL 105 11/30/2014 0526   CO2 29 11/30/2014 0526   GLUCOSE 128* 11/30/2014 0526   BUN 14 11/30/2014 0526   CREATININE 0.77 11/30/2014 0526   CALCIUM 8.9 11/30/2014 0526   PROT 6.8 11/30/2014 0526   ALBUMIN 3.6 11/30/2014 0526   AST 32 11/30/2014 0526   ALT 42* 11/30/2014 0526   ALKPHOS 96 11/30/2014 0526   BILITOT 0.8 11/30/2014 0526   GFRNONAA 84* 11/30/2014 0526   GFRAA >90 11/30/2014 0526    Lab Results  Component Value Date   HGBA1C 6.1* 11/30/2014   No results found for: [id number] Lab Results  Component Value Date   TSH 0.988 11/29/2014      ASSESSMENT AND PLAN  69 y.o. year old female  has a past medical history of migraine headache and small vessel disease. She has responded well to nortriptyline 20 mg at night.  Continue Nortriptyline at 46m daily Continue Imitrex prn Given a list of migraine triggers eliminate one a time reviewed these with the patient,  Keep a diary if headaches worsen Exercise for overall health and well-being F/U in 6 monthsVst time 17 min NDennie Bible [facility] [address] SHayden[place]uis M. [place] [phone]

## 2015-08-01 NOTE — Patient Instructions (Signed)
Continue Nortriptyline at 65m daily Continue Imitrex prn Given a list of migraine triggers eliminate one a time F/U in 6 months

## 2015-08-03 NOTE — [place] Notes (Signed)
I have reviewed and agreed above plan. 

## 2015-08-13 DIAGNOSIS — Z23 Encounter for immunization: Secondary | ICD-10-CM | POA: Diagnosis not present

## 2015-08-16 DIAGNOSIS — Z853 Personal history of malignant neoplasm of breast: Secondary | ICD-10-CM | POA: Diagnosis not present

## 2015-08-16 DIAGNOSIS — G43009 Migraine without aura, not intractable, without status migrainosus: Secondary | ICD-10-CM | POA: Diagnosis not present

## 2015-08-16 DIAGNOSIS — Z1211 Encounter for screening for malignant neoplasm of colon: Secondary | ICD-10-CM | POA: Diagnosis not present

## 2015-08-16 DIAGNOSIS — Z1389 Encounter for screening for other disorder: Secondary | ICD-10-CM | POA: Diagnosis not present

## 2015-08-16 DIAGNOSIS — M8588 Other specified disorders of bone density and structure, other site: Secondary | ICD-10-CM | POA: Diagnosis not present

## 2015-08-16 DIAGNOSIS — I10 Essential (primary) hypertension: Secondary | ICD-10-CM | POA: Diagnosis not present

## 2015-08-16 DIAGNOSIS — Z23 Encounter for immunization: Secondary | ICD-10-CM | POA: Diagnosis not present

## 2015-08-16 DIAGNOSIS — Z0000 Encounter for general adult medical examination without abnormal findings: Secondary | ICD-10-CM | POA: Diagnosis not present

## 2015-08-16 DIAGNOSIS — M79672 Pain in left foot: Secondary | ICD-10-CM | POA: Diagnosis not present

## 2015-08-16 DIAGNOSIS — E785 Hyperlipidemia, unspecified: Secondary | ICD-10-CM | POA: Diagnosis not present

## 2015-08-16 DIAGNOSIS — F418 Other specified anxiety disorders: Secondary | ICD-10-CM | POA: Diagnosis not present

## 2015-09-06 DIAGNOSIS — Z1211 Encounter for screening for malignant neoplasm of colon: Secondary | ICD-10-CM | POA: Diagnosis not present

## 2015-11-27 DIAGNOSIS — N63 Unspecified lump in breast: Secondary | ICD-10-CM | POA: Diagnosis not present

## 2015-11-27 DIAGNOSIS — Z853 Personal history of malignant neoplasm of breast: Secondary | ICD-10-CM | POA: Diagnosis not present

## 2015-11-27 DIAGNOSIS — Z780 Asymptomatic menopausal state: Secondary | ICD-10-CM | POA: Diagnosis not present

## 2015-11-27 DIAGNOSIS — M8588 Other specified disorders of bone density and structure, other site: Secondary | ICD-10-CM | POA: Diagnosis not present

## 2015-11-27 DIAGNOSIS — N6001 Solitary cyst of right breast: Secondary | ICD-10-CM | POA: Diagnosis not present

## 2015-12-24 ENCOUNTER — Encounter: Payer: Self-pay | Admitting: Nurse Practitioner

## 2016-01-09 DIAGNOSIS — H25012 Cortical age-related cataract, left eye: Secondary | ICD-10-CM | POA: Diagnosis not present

## 2016-01-09 DIAGNOSIS — H401121 Primary open-angle glaucoma, left eye, mild stage: Secondary | ICD-10-CM | POA: Diagnosis not present

## 2016-01-09 DIAGNOSIS — H401111 Primary open-angle glaucoma, right eye, mild stage: Secondary | ICD-10-CM | POA: Diagnosis not present

## 2016-01-09 DIAGNOSIS — H25011 Cortical age-related cataract, right eye: Secondary | ICD-10-CM | POA: Diagnosis not present

## 2016-01-30 ENCOUNTER — Ambulatory Visit: Payer: Medicare Other | Admitting: Nurse Practitioner

## 2016-02-06 ENCOUNTER — Ambulatory Visit (INDEPENDENT_AMBULATORY_CARE_PROVIDER_SITE_OTHER): Payer: Medicare Other | Admitting: Nurse Practitioner

## 2016-02-06 ENCOUNTER — Encounter: Payer: Self-pay | Admitting: Nurse Practitioner

## 2016-02-06 VITALS — BP 142/87 | HR 70 | Ht 64.0 in | Wt 146.2 lb

## 2016-02-06 DIAGNOSIS — I679 Cerebrovascular disease, unspecified: Secondary | ICD-10-CM

## 2016-02-06 DIAGNOSIS — G43009 Migraine without aura, not intractable, without status migrainosus: Secondary | ICD-10-CM | POA: Diagnosis not present

## 2016-02-06 DIAGNOSIS — I10 Essential (primary) hypertension: Secondary | ICD-10-CM | POA: Diagnosis not present

## 2016-02-06 MED ORDER — NORTRIPTYLINE HCL 10 MG PO CAPS: ORAL_CAPSULE | ORAL | Status: DC

## 2016-02-06 MED ORDER — SUMATRIPTAN SUCCINATE 50 MG PO TABS: 50.0000 mg | ORAL_TABLET | ORAL | Status: DC | PRN

## 2016-02-06 NOTE — Patient Instructions (Addendum)
Continue Nortriptyline at 43m daily Continue Imitrex prn Will refill for 1 year Follow up yearly and prn

## 2016-02-06 NOTE — [place] Notes (Signed)
I have reviewed and agreed above plan. 

## 2016-02-06 NOTE — Progress Notes (Signed)
[facility]  PATIENT: Autumn Conley DOB: [birth year]   REASON FOR VISIT: Follow-up for migraine HISTORY FROM: Patient    HISTORY OF PRESENT ILLNESS:Autumn Conley is a 70 yo RH female, is referred by her primary care Dr. Nena Jordan, IBTEHAL, accompanied by her daughter Autumn Conley for evaluation of headaches  She had a history of hypertension, hyperlipidemia, depression, left breast cancer, status post left lobectomy, bilateral glaucoma, was found during routine examination, receiving eyedrops.  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  In November 29 2014, she presented to the emergency room for 2 weeks history of persistent headaches, chest pain, had extensive evaluations,  I have reviewed MRI brain in Dec 01 2014: . No acute intracranial abnormality, moderate small vessel disease  Laboratory in February 12th 2016, mild elevated A1c 6.1, normal TSH, CBC, CMP.  Normal ultrasound of carotid artery, and echocardiogram December 02 2014.  She was put on Depakote IR 250 mg twice a day, she tolerated the medications well, but continue have mild to moderate daily pressure headaches,  She also has history of depression, taking Paxil, she moved from [facility] to [place] in around 2010, lives at home with her husband, complains of mild memory trouble, word finding  difficulties UPDATE May 01 2015:She is with her husband at today's visit, she still has 2 headache a week, she still takes goody powder prn, but much less frequent, she uses cold compression,  She used imitrex 25 mg as needed, which was helpful, but sometimes she requires second dosage, she tried one time of 50 mg tablets of Imitrex, seems to work better, no significant side effect noticed. She was not sure the benefit of Depakote as headache prevention, does reported 10 pound weight gain over the past 3 months UPDATE 08/01/15 Autumn Conley, 70 year old female returns for follow-up. She has a history of migraines and was last seen by Dr. Krista Blue 05/01/2015. At that time her preventive was changed from Depakote to nortriptyline and she is currently taking 20 mg at night. She has had 3 headaches in the last 3 months and is pleased with her response. She continues to use Imitrex acutely. She denies side effects to the medication. She returns for reevaluation UPDATE 04/19/2017CM Autumn Conley, 70 year old female returns for follow-up. She has a history of migraine headaches. Her headaches are doing well at present she is currently taking nortriptyline 20 mg at night as a preventive. She takes Imitrex acutely. She denies any side effects to the medication. She is pleased with how well she has responded. She returns for refills and reevaluation    REVIEW OF SYSTEMS: Full 14 system review of systems performed and notable only for those listed, all others are neg:  Constitutional: neg  Cardiovascular: neg Ear/Nose/Throat: neg  Skin: neg Eyes: Light sensitivity, itching Respiratory: neg Gastroitestinal: neg  Hematology/Lymphatic: neg  Endocrine: neg Musculoskeletal:neg Allergy/Immunology: neg Neurological: Migraine headaches Psychiatric: Anxiety Sleep : neg   ALLERGIES: No Known Allergies  HOME MEDICATIONS: Outpatient Prescriptions Prior to Visit  Medication Sig Dispense Refill  . aspirin EC 81 MG  tablet Take 81 mg by mouth daily.    Marland Kitchen atorvastatin (LIPITOR) 40 MG tablet Take 40 mg by mouth at bedtime.    Sarajane Marek Sodium (STOOL SOFTENER PLUS PO) Take 1 tablet by mouth as needed (for constipation).     Marland Kitchen losartan (COZAAR) 25 MG tablet Take 25 mg by mouth daily.  6  . Magnesium 400 MG CAPS Take by mouth 2 (two) times daily.    . nortriptyline (PAMELOR) 10 MG capsule One po qhs xone week, then 2 tabs po qhs 180 capsule 2  . PARoxetine (PAXIL) 20 MG tablet Take 60 mg by mouth at bedtime.    . Riboflavin 100 MG CAPS Take by mouth 2 (two) times daily.    . SUMAtriptan (IMITREX) 50 MG tablet Take 1 tablet (50 mg total) by mouth every 2 (two) hours as needed for migraine. May repeat in 2 hours if headache persists or recurs. 27 tablet 3  . timolol (BETIMOL) 0.5 % ophthalmic solution 1 drop daily.    Marland Kitchen tiZANidine (ZANAFLEX) 2 MG tablet Take by mouth every 6 (six) hours as needed for muscle spasms.    . Vitamin D, Ergocalciferol, (DRISDOL) 50000 UNITS CAPS capsule Take 50,000 Units by mouth once a week.  0  . vitamin E 100 UNIT capsule Take 400 Units by mouth daily.     No facility-administered medications prior to visit.    PAST MEDICAL HISTORY: Past Medical History  Diagnosis Date  . Hypercholesteremia   . Hypertension   . IBS (irritable bowel syndrome)   . Cancer of left breast ([place])   . PONV (postoperative nausea and vomiting)   . Mitral valve prolapse   . GERD (gastroesophageal reflux disease)   . Headache     "weekly" (11/29/2014)  . Arthritis     "joints" (11/29/2014)  . Osteoarthritis   . Autoimmune disorder ([place])     "non-specific"  . Anxiety   . Depression   . Fibromyalgia     "some; not chronic" (11/29/2014)  . Glaucoma of both eyes   . NASH (nonalcoholic steatohepatitis)     PAST SURGICAL HISTORY: Past Surgical History  Procedure Laterality Date  . Appendectomy  1953  . Abdominal hysterectomy  1980  . Mastectomy Left ~ 2009  . Breast biopsy Left     . Breast lumpectomy Left     FAMILY HISTORY: Family History  Problem Relation Age of Onset  . Lung cancer Mother   . Prostate cancer Brother     SOCIAL HISTORY: Social History   Social History  . Marital Status: Married    Spouse Name: N/A  . Number of Children: 1  . Years of Education: 14   Occupational History  . Retired    Social History Main Topics  . Smoking status: Never Smoker   . Smokeless tobacco: Never Used  . Alcohol Use: No  . Drug Use: No  . Sexual Activity: No   Other Topics Concern  . Not on file   Social History Narrative   Lives at home with her husband.   Right-handed.   2 cups caffeine/day.     PHYSICAL EXAM  Filed Vitals:   02/06/16 1350  BP: 142/87  Pulse: 70  Height: 5' 4"  (1.626 m)  Weight: 146 lb 3.2 oz (66.316 kg)   Body mass index is 25.08 kg/(m^2). Generalized: Well developed, in no  acute distress  Head: normocephalic and atraumatic,. Oropharynx benign  Neck: Supple, no carotid bruits  Cardiac: Regular rate rhythm, no murmur  Musculoskeletal: No deformity   Neurological examination   Mentation: Alert oriented to time, place, history taking. Attention span and concentration appropriate. Recent and remote memory intact. Follows all commands speech and language fluent.   Cranial nerve II-XII: Pupils were equal round reactive to light extraocular movements were full, visual field were full on confrontational test. Facial sensation and strength were normal. hearing was intact to finger rubbing bilaterally. Uvula tongue midline. head turning and shoulder shrug were normal and symmetric.Tongue protrusion into cheek strength was normal. Motor: normal bulk and tone, full strength in the BUE, BLE, fine finger movements normal, no pronator drift. No focal weakness Sensory: normal and symmetric to light touch Coordination: finger-nose-finger, heel-to-shin bilaterally, no dysmetria Reflexes: Brachioradialis 2/2, biceps 2/2, triceps  2/2, patellar 2/2, Achilles 2/2, plantar responses were flexor bilaterally. Gait and Station: Rising up from seated position without assistance, normal stance, moderate stride, good arm swing, smooth turning, able to perform tiptoe, and heel walking without difficulty. Tandem gait is steady   DIAGNOSTIC DATA (LABS, IMAGING, TESTING)   ASSESSMENT AND PLAN 70 year old female has a past medical history of migraine headache and small vessel disease. She has responded well to nortriptyline 20 mg at night and Imitrex acutely.  Continue Nortriptyline at 68m daily Continue Imitrex prn Reviewed  a list of migraine triggers eliminate one a time given written copy  Keep a diary if headaches worsen Exercise for overall health and well-being F[facility] 1 [facility] [address] [place] [phone]

## 2016-02-14 DIAGNOSIS — R634 Abnormal weight loss: Secondary | ICD-10-CM | POA: Diagnosis not present

## 2016-02-14 DIAGNOSIS — E785 Hyperlipidemia, unspecified: Secondary | ICD-10-CM | POA: Diagnosis not present

## 2016-02-14 DIAGNOSIS — I10 Essential (primary) hypertension: Secondary | ICD-10-CM | POA: Diagnosis not present

## 2016-02-14 DIAGNOSIS — G43009 Migraine without aura, not intractable, without status migrainosus: Secondary | ICD-10-CM | POA: Diagnosis not present

## 2016-02-25 ENCOUNTER — Other Ambulatory Visit: Payer: Self-pay | Admitting: Nurse Practitioner

## 2016-03-04 ENCOUNTER — Telehealth: Payer: Self-pay | Admitting: *Deleted

## 2016-03-04 ENCOUNTER — Ambulatory Visit (INDEPENDENT_AMBULATORY_CARE_PROVIDER_SITE_OTHER): Payer: Medicare Other | Admitting: Neurology

## 2016-03-04 ENCOUNTER — Encounter: Payer: Self-pay | Admitting: Neurology

## 2016-03-04 VITALS — Ht 64.0 in | Wt 143.0 lb

## 2016-03-04 DIAGNOSIS — G43009 Migraine without aura, not intractable, without status migrainosus: Secondary | ICD-10-CM | POA: Diagnosis not present

## 2016-03-04 DIAGNOSIS — R202 Paresthesia of skin: Secondary | ICD-10-CM | POA: Diagnosis not present

## 2016-03-04 DIAGNOSIS — I679 Cerebrovascular disease, unspecified: Secondary | ICD-10-CM

## 2016-03-04 MED ORDER — LAMOTRIGINE 25 MG PO TABS: ORAL_TABLET | ORAL | Status: DC

## 2016-03-04 MED ORDER — BUTALBITAL-APAP-CAFFEINE 50-325-40 MG PO TABS: 1.0000 | ORAL_TABLET | Freq: Four times a day (QID) | ORAL | Status: DC | PRN

## 2016-03-04 NOTE — Telephone Encounter (Signed)
Patient is calling and states she is at [place], [place] and they say they have not received the Rx butalbital-acetaminophen-caffeine 50-325-40 mg tablets.  Please call pharmacy. thanks

## 2016-03-04 NOTE — Telephone Encounter (Signed)
Rx for Fioricet faxed and confirmed to [facility] at [phone].

## 2016-03-04 NOTE — Telephone Encounter (Signed)
Called pharmacy and spoke to [facility] who stated they have the prescription ready for her.  Called the patient back to let her know it can be picked up.

## 2016-03-04 NOTE — Telephone Encounter (Signed)
She has been worked into Dr. Rhea Belton schedule today.

## 2016-03-04 NOTE — Progress Notes (Signed)
Chief Complaint  Patient presents with  . Numbness    She is here with her daughter, Autumn Conley. States one week ago she started having intermittent numbness on the left side of her mouth and several of the fingers on her left hand. Episodes have been lasting 2-10 minutes. Reports having seven events on 03/04/16.     [facility]  PATIENT: Autumn Conley DOB: Oct 19, 1946   REASON FOR VISIT: Follow-up for migraine HISTORY FROM: Patient    HISTORY OF PRESENT ILLNESS:Autumn Conley is a 70 yo RH female, is referred by her primary care Dr. Nena Jordan, IBTEHAL, accompanied by her daughter Autumn Conley for evaluation of headaches, Initial visit was February 2016.  She had a history of hypertension, hyperlipidemia, depression, left breast cancer, status post left lobectomy, bilateral glaucoma, was found during routine examination, receiving eyedrops.  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  In November 29 2014, she presented to the emergency room for 2 weeks history of persistent headaches, chest pain, had extensive evaluations,  I have reviewed MRI brain in Dec 01 2014: . No acute intracranial abnormality, moderate small vessel disease  Laboratory in February 12th 2016, mild elevated A1c 6.1, normal TSH, CBC, CMP.  Normal ultrasound of carotid artery, and echocardiogram December 02 2014.  She  was put on Depakote IR 250 mg twice a day, she tolerated the medications well, but continue have mild to moderate daily pressure headaches,  She also has history of depression, taking Paxil, she moved from [facility] to [place] in around 2010, lives at home with her husband, complains of mild memory trouble, word finding difficulties UPDATE May 01 2015:She is with her husband at today's visit, she still has 2 headache a week, she still takes goody powder prn, but much less frequent, she uses cold compression,  She used imitrex 25 mg as needed, which was helpful, but sometimes she requires second dosage, she tried one time of 50 mg tablets of Imitrex, seems to work better, no significant side effect noticed. She was not sure the benefit of Depakote as headache prevention, does reported 10 pound weight gain over the past 3 months  Her preventive was changed from Depakote to nortriptyline in July 2016, and she is currently taking 20 mg at night. She has had 3 headaches in the last 3 months and is pleased with her response. She continues to use Imitrex acutely. She denies side effects to the medication. She returns for reevaluation  Update Mar 04 2016: Since May 9th 2017, she noticed intermittent left tongue, lips, left finger tip paresthesia, lasting 2-30 minutes, no loss of consciousness, no weakness, last night in Mar 03 2016, she had 7 episodes in 5 hours span  We have personally reviewed MRI of the brain without contrast February 2016, moderate atrophy, supratentorium small vessel disease  Her migraine headache has much improved, she only take Imitrex rarely, works well for her headache, occasionally she take [facility] powder as needed  REVIEW OF SYSTEMS: Full 14 system review of systems performed and notable only for those listed, all others are neg:  Ringing ears, eye discharge, eye itching, eye redness, light sensitivity, constipation, diarrhea, joints pain, joint swelling, apnea,  memory loss, headache, numbness, confusion, depression/anxiety  ALLERGIES: No Known Allergies  HOME MEDICATIONS: Outpatient Prescriptions Prior to Visit  Medication Sig Dispense Refill  . aspirin EC 81 MG tablet Take 81 mg by mouth daily.    Sarajane Marek Sodium (STOOL SOFTENER PLUS PO) Take 1 tablet by mouth as needed (for constipation).     Marland Kitchen losartan (COZAAR) 25 MG tablet Take 25 mg by mouth daily.  6  . nortriptyline (PAMELOR) 10 MG capsule 2 tabs po qhs 180 capsule 3  . PARoxetine (PAXIL) 20 MG tablet Take 60 mg by mouth at bedtime.    . SUMAtriptan (IMITREX) 50 MG tablet Take 1 tablet (50 mg total) by mouth every 2 (two) hours as needed for migraine. May repeat in 2 hours if headache persists or recurs. 27 tablet 3  . timolol (BETIMOL) 0.5 % ophthalmic solution 1 drop daily.    Marland Kitchen tiZANidine (ZANAFLEX) 2 MG tablet Take by mouth every 6 (six) hours as needed for muscle spasms.    . Vitamin D, Ergocalciferol, (DRISDOL) 50000 UNITS CAPS capsule Take 50,000 Units by mouth once a week.  0  . vitamin E 100 UNIT capsule Take 400 Units by mouth daily.    Marland Kitchen atorvastatin (LIPITOR) 40 MG tablet Take 40 mg by mouth at bedtime.    . Magnesium 400 MG CAPS Take by mouth 2 (two) times daily.    . Riboflavin 100 MG CAPS Take by mouth 2 (two) times daily.     No facility-administered medications prior to visit.    PAST MEDICAL HISTORY: Past Medical History  Diagnosis Date  . Hypercholesteremia   . Hypertension   . IBS (irritable bowel syndrome)   . Cancer of left breast ([place])   . PONV (postoperative nausea and vomiting)   . Mitral valve prolapse   . GERD (gastroesophageal reflux disease)   . Headache     "weekly" (11/29/2014)  . Arthritis     "joints" (11/29/2014)  . Osteoarthritis   . Autoimmune disorder ([place])     "non-specific"  . Anxiety   . Depression   . Fibromyalgia     "some; not chronic" (11/29/2014)  . Glaucoma of both eyes   . NASH (nonalcoholic steatohepatitis)      PAST SURGICAL HISTORY: Past Surgical History  Procedure Laterality Date  . Appendectomy  1953  . Abdominal hysterectomy  1980  . Mastectomy Left ~ 2009  . Breast biopsy Left   . Breast lumpectomy Left     FAMILY HISTORY: Family History  Problem Relation Age of Onset  . Lung cancer Mother   . Prostate cancer Brother     SOCIAL HISTORY: Social History   Social History  . Marital Status: Married    Spouse Name: N/A  . Number of Children: 1  . Years of Education: 14   Occupational History  . Retired    Social History Main Topics  . Smoking status: Never Smoker   . Smokeless tobacco: Never Used  . Alcohol Use: No  . Drug Use: No  . Sexual Activity: No   Other Topics Concern  . Not on file   Social History Narrative   Lives at home with her husband.   Right-handed.   2 cups caffeine/day.  PHYSICAL EXAM  Filed Vitals:   03/04/16 1310  Height: 5' 4"  (1.626 m)  Weight: 143 lb (64.864 kg)   Body mass index is 24.53 kg/(m^2).  PHYSICAL EXAMNIATION:  Gen: NAD, conversant, well nourised, obese, well groomed                     Cardiovascular: Regular rate rhythm, no peripheral edema, warm, nontender. Eyes: Conjunctivae clear without exudates or hemorrhage Neck: Supple, no carotid bruise. Pulmonary: Clear to auscultation bilaterally   NEUROLOGICAL EXAM:  MENTAL STATUS: Speech:    Speech is normal; fluent and spontaneous with normal comprehension.  Cognition:     Orientation to time, place and person     Normal recent and remote memory     Normal Attention span and concentration     Normal Language, naming, repeating,spontaneous speech     Fund of knowledge   CRANIAL NERVES: CN II: Visual fields are full to confrontation. Fundoscopic exam is normal with sharp discs and no vascular changes. Pupils are round equal and briskly reactive to light. CN III, IV, VI: extraocular movement are normal. No ptosis. CN V: Facial sensation is intact to pinprick  in all 3 divisions bilaterally. Corneal responses are intact.  CN VII: Face is symmetric with normal eye closure and smile. CN VIII: Hearing is normal to rubbing fingers CN IX, X: Palate elevates symmetrically. Phonation is normal. CN XI: Head turning and shoulder shrug are intact CN XII: Tongue is midline with normal movements and no atrophy.  MOTOR: There is no pronator drift of out-stretched arms. Muscle bulk and tone are normal. Muscle strength is normal.  REFLEXES: Reflexes are 2+ and symmetric at the biceps, triceps, knees, and ankles. Plantar responses are flexor.  SENSORY: Intact to light touch, pinprick, positional and vibratory sensation are intact in fingers and toes.  COORDINATION: Rapid alternating movements and fine finger movements are intact. There is no dysmetria on finger-to-nose and heel-knee-shin.    GAIT/STANCE: Posture is normal. Gait is steady with normal steps, base, arm swing, and turning. Heel and toe walking are normal. Tandem gait is normal.  Romberg is absent.    DIAGNOSTIC DATA (LABS, IMAGING, TESTING)   ASSESSMENT AND PLAN 70 year old female   Chronic migraine   Responding well to nortriptyline 10 mg 2 tablets every night   She only has occasionally migraine now, responding well to Imitrex  Stop tizanidine every night  New onset intermittent left facial, left hand paresthesia  Localized lesion to right thalamus versus right cortex  Differentiation diagnosis includes TIA versus partial seizure  Proceed with MRI of the brain  EEG  History of moderate cerebral small vessel disease  Keep daily aspirin, keep well hydration  Continue moderate exercise  Previously normal echocardiogram, no significant stenosis at ultrasound of carotid artery February 2016

## 2016-03-04 NOTE — Telephone Encounter (Signed)
Spoke to patient - states one week ago she started having intermittent numbness on the left side of her mouth and several of her fingers on her left hand.  Episodes have been lasting 2-10 minutes.  Reports having seven events on 03/04/16.

## 2016-03-04 NOTE — Telephone Encounter (Signed)
Pt called, having TIA's. Please advise. [phone]

## 2016-03-06 DIAGNOSIS — G43009 Migraine without aura, not intractable, without status migrainosus: Secondary | ICD-10-CM | POA: Diagnosis not present

## 2016-03-06 DIAGNOSIS — R202 Paresthesia of skin: Secondary | ICD-10-CM | POA: Diagnosis not present

## 2016-03-06 DIAGNOSIS — I6789 Other cerebrovascular disease: Secondary | ICD-10-CM | POA: Diagnosis not present

## 2016-03-07 ENCOUNTER — Ambulatory Visit (INDEPENDENT_AMBULATORY_CARE_PROVIDER_SITE_OTHER): Payer: Self-pay

## 2016-03-07 DIAGNOSIS — Z0289 Encounter for other administrative examinations: Secondary | ICD-10-CM

## 2016-03-07 DIAGNOSIS — G43009 Migraine without aura, not intractable, without status migrainosus: Secondary | ICD-10-CM

## 2016-03-07 DIAGNOSIS — I679 Cerebrovascular disease, unspecified: Secondary | ICD-10-CM

## 2016-03-07 DIAGNOSIS — R202 Paresthesia of skin: Secondary | ICD-10-CM

## 2016-03-10 ENCOUNTER — Telehealth: Payer: Self-pay | Admitting: Neurology

## 2016-03-10 NOTE — Telephone Encounter (Signed)
Spoke to patient - she is aware of results and will keep her pending appts.

## 2016-03-10 NOTE — Telephone Encounter (Signed)
Please call patient MRI of the brain showed age-related changes, small vessel disease, no change compared to previous scan February 2016   IMPRESSION: This MRI of the brain without contrast shows the following: 1. Scattered T2/FLAIR hyperintense foci in the pons and the hemispheres consistent with chronic microvascular ischemic changes. None of the foci appears to be acute and there is no definite change when compared to the MRI dated 12/01/2014 2. There are no acute findings.

## 2016-03-14 ENCOUNTER — Other Ambulatory Visit: Payer: Self-pay | Admitting: Nurse Practitioner

## 2016-03-20 DIAGNOSIS — G4733 Obstructive sleep apnea (adult) (pediatric): Secondary | ICD-10-CM | POA: Diagnosis not present

## 2016-04-01 ENCOUNTER — Ambulatory Visit (INDEPENDENT_AMBULATORY_CARE_PROVIDER_SITE_OTHER): Payer: Medicare Other | Admitting: Neurology

## 2016-04-01 DIAGNOSIS — R202 Paresthesia of skin: Secondary | ICD-10-CM

## 2016-04-01 DIAGNOSIS — R2990 Unspecified symptoms and signs involving the nervous system: Secondary | ICD-10-CM | POA: Diagnosis not present

## 2016-04-01 DIAGNOSIS — I679 Cerebrovascular disease, unspecified: Secondary | ICD-10-CM

## 2016-04-01 DIAGNOSIS — G43009 Migraine without aura, not intractable, without status migrainosus: Secondary | ICD-10-CM

## 2016-04-01 NOTE — Progress Notes (Signed)
No chief complaint on file.    [facility]  PATIENT: Autumn Conley DOB: 11-04-1945   REASON FOR VISIT: Follow-up for migraine HISTORY FROM: Patient    HISTORY OF PRESENT ILLNESS:Autumn Conley is a 70 yo RH female, is referred by her primary care Dr. Nena Jordan, IBTEHAL, accompanied by her daughter [name] for evaluation of headaches, Initial visit was February 2016.  She had a history of hypertension, hyperlipidemia, depression, left breast cancer, status post left lobectomy, bilateral glaucoma, was found during routine examination, receiving eyedrops.  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  In November 29 2014, she presented to the emergency room for 2 weeks history of persistent headaches, chest pain, had extensive evaluations,  I have reviewed MRI brain in Dec 01 2014: . No acute intracranial abnormality, moderate small vessel disease  Laboratory in February 12th 2016, mild elevated A1c 6.1, normal TSH, CBC, CMP.  Normal ultrasound of carotid artery, and echocardiogram December 02 2014.  She was put on Depakote IR 250 mg twice a day, she tolerated the medications well, but continue have mild to moderate daily pressure headaches,  She also has history of depression, taking Paxil, she moved from [facility] to [place] in around 2010, lives at home with her  husband, complains of mild memory trouble, word finding difficulties  UPDATE May 01 2015:She is with her husband at today's visit, she still has 2 headache a week, she still takes goody powder prn, but much less frequent, she uses cold compression,  She used imitrex 25 mg as needed, which was helpful, but sometimes she requires second dosage, she tried one time of 50 mg tablets of Imitrex, seems to work better, no significant side effect noticed. She was not sure the benefit of Depakote as headache prevention, does reported 10 pound weight gain over the past 3 months  Her preventive was changed from Depakote to nortriptyline in July 2016, and she is currently taking 20 mg at night. She has had 3 headaches in the last 3 months and is pleased with her response. She continues to use Imitrex acutely. She denies side effects to the medication. She returns for reevaluation  Update Mar 04 2016: Since May 9th 2017, she noticed intermittent left tongue, lips, left finger tip paresthesia, lasting 2-30 minutes, no loss of consciousness, no weakness, last night in Mar 03 2016, she had 7 episodes in 5 hours span  We have personally reviewed MRI of the brain without contrast February 2016, moderate atrophy, supratentorium small vessel disease  Her migraine headache has much improved, she only take Imitrex rarely, works well for her headache, occasionally she take [facility] powder as needed  REVIEW OF SYSTEMS: Full 14 system review of systems performed and notable only for those listed, all others are neg:  Ringing ears, eye discharge, eye itching, eye redness, light sensitivity, constipation, diarrhea, joints pain, joint swelling, apnea, memory loss, headache, numbness, confusion, depression/anxiety  ALLERGIES: No Known Allergies  HOME  MEDICATIONS: Outpatient Prescriptions Prior to Visit  Medication Sig Dispense Refill  . aspirin EC 81 MG tablet Take 81 mg by mouth daily.    Marland Kitchen atorvastatin (LIPITOR) 20 MG  tablet Take 20 mg by mouth daily.    . butalbital-acetaminophen-caffeine (FIORICET, ESGIC) 50-325-40 MG tablet Take 1 tablet by mouth every 6 (six) hours as needed for headache. 12 tablet 5  . Casanthranol-Docusate Sodium (STOOL SOFTENER PLUS PO) Take 1 tablet by mouth as needed (for constipation).     Marland Kitchen lamoTRIgine (LAMICTAL) 25 MG tablet One po bid xone week, 2 tabs po bid xone week, then 3 tabs po bid 180 tablet 6  . losartan (COZAAR) 25 MG tablet Take 25 mg by mouth daily.  6  . nortriptyline (PAMELOR) 10 MG capsule 2 tabs po qhs 180 capsule 3  . PARoxetine (PAXIL) 20 MG tablet Take 60 mg by mouth at bedtime.    . timolol (BETIMOL) 0.5 % ophthalmic solution 1 drop daily.    . Vitamin D, Ergocalciferol, (DRISDOL) 50000 UNITS CAPS capsule Take 50,000 Units by mouth once a week.  0  . vitamin E 100 UNIT capsule Take 400 Units by mouth daily.     No facility-administered medications prior to visit.    PAST MEDICAL HISTORY: Past Medical History  Diagnosis Date  . Hypercholesteremia   . Hypertension   . IBS (irritable bowel syndrome)   . Cancer of left breast ([place])   . PONV (postoperative nausea and vomiting)   . Mitral valve prolapse   . GERD (gastroesophageal reflux disease)   . Headache     "weekly" (11/29/2014)  . Arthritis     "joints" (11/29/2014)  . Osteoarthritis   . Autoimmune disorder ([place])     "non-specific"  . Anxiety   . Depression   . Fibromyalgia     "some; not chronic" (11/29/2014)  . Glaucoma of both eyes   . NASH (nonalcoholic steatohepatitis)     PAST SURGICAL HISTORY: Past Surgical History  Procedure Laterality Date  . Appendectomy  1953  . Abdominal hysterectomy  1980  . Mastectomy Left ~ 2009  . Breast biopsy Left   . Breast lumpectomy Left     FAMILY HISTORY: Family History  Problem Relation Age of Onset  . Lung cancer Mother   . Prostate cancer Brother     SOCIAL HISTORY: Social History   Social History  . Marital Status: Married     Spouse Name: N/A  . Number of Children: 1  . Years of Education: 14   Occupational History  . Retired    Social History Main Topics  . Smoking status: Never Smoker   . Smokeless tobacco: Never Used  . Alcohol Use: No  . Drug Use: No  . Sexual Activity: No   Other Topics Concern  . Not on file   Social History Narrative   Lives at home with her husband.   Right-handed.   2 cups caffeine/day.     PHYSICAL EXAM  There were no vitals filed for this visit. There is no weight on file to calculate BMI.  PHYSICAL EXAMNIATION:  Gen: NAD, conversant, well nourised, obese, well groomed                     Cardiovascular: Regular rate rhythm, no peripheral edema, warm, nontender. Eyes: Conjunctivae clear without exudates or hemorrhage Neck: Supple, no carotid bruise. Pulmonary: Clear to auscultation bilaterally   NEUROLOGICAL EXAM:  MENTAL STATUS: Speech:  Speech is normal; fluent and spontaneous with normal comprehension.  Cognition:     Orientation to time, place and person     Normal recent and remote memory     Normal Attention span and concentration     Normal Language, naming, repeating,spontaneous speech     Fund of knowledge   CRANIAL NERVES: CN II: Visual fields are full to confrontation. Fundoscopic exam is normal with sharp discs and no vascular changes. Pupils are round equal and briskly reactive to light. CN III, IV, VI: extraocular movement are normal. No ptosis. CN V: Facial sensation is intact to pinprick in all 3 divisions bilaterally. Corneal responses are intact.  CN VII: Face is symmetric with normal eye closure and smile. CN VIII: Hearing is normal to rubbing fingers CN IX, X: Palate elevates symmetrically. Phonation is normal. CN XI: Head turning and shoulder shrug are intact CN XII: Tongue is midline with normal movements and no atrophy.  MOTOR: There is no pronator drift of out-stretched arms. Muscle bulk and tone are normal. Muscle strength  is normal.  REFLEXES: Reflexes are 2+ and symmetric at the biceps, triceps, knees, and ankles. Plantar responses are flexor.  SENSORY: Intact to light touch, pinprick, positional and vibratory sensation are intact in fingers and toes.  COORDINATION: Rapid alternating movements and fine finger movements are intact. There is no dysmetria on finger-to-nose and heel-knee-shin.    GAIT/STANCE: Posture is normal. Gait is steady with normal steps, base, arm swing, and turning. Heel and toe walking are normal. Tandem gait is normal.  Romberg is absent.    DIAGNOSTIC DATA (LABS, IMAGING, TESTING)   ASSESSMENT AND PLAN 70 year old female   Chronic migraine   Responding well to nortriptyline 10 mg 2 tablets every night   She only has occasionally migraine now, responding well to Imitrex  Stop tizanidine every night  New onset intermittent left facial, left hand paresthesia  Localized lesion to right thalamus versus right cortex  Differentiation diagnosis includes TIA versus partial seizure  Proceed with MRI of the brain  EEG  History of moderate cerebral small vessel disease  Keep daily aspirin, keep well hydration  Continue moderate exercise  Previously normal echocardiogram, no significant stenosis at ultrasound of carotid artery February 2016

## 2016-04-03 NOTE — Procedures (Signed)
   HISTORY: 70 years old female, with history of migraine headaches, presented with intermittent left-sided paresthesia.  TECHNIQUE:  16 channel EEG was performed based on standard 10-16 international system. One channel was dedicated to EKG, which has demonstrates normal sinus rhythm of 66 beats per minutes.  Upon awakening, the posterior background activity was well-developed, in alpha range, 10 Hz, reactive to eye opening and closure.  There was no evidence of epileptiform discharge. There are frequent bilateral frontal dominant small amplitude beta range activities.  Photic stimulation was  not performed.  Hyperventilation was performed, there was no abnormality elicit.  No sleep was achieved.  CONCLUSION: This is a  normal  awake EEG.  There is no electrodiagnostic evidence of epileptiform discharge, the increased beta range activities usually associated with benzodiazepine use

## 2016-04-10 ENCOUNTER — Ambulatory Visit (INDEPENDENT_AMBULATORY_CARE_PROVIDER_SITE_OTHER): Payer: Medicare Other | Admitting: Neurology

## 2016-04-10 ENCOUNTER — Encounter: Payer: Self-pay | Admitting: Neurology

## 2016-04-10 VITALS — BP 132/77 | HR 74 | Ht 64.0 in | Wt 143.0 lb

## 2016-04-10 DIAGNOSIS — H524 Presbyopia: Secondary | ICD-10-CM | POA: Diagnosis not present

## 2016-04-10 DIAGNOSIS — G43009 Migraine without aura, not intractable, without status migrainosus: Secondary | ICD-10-CM | POA: Diagnosis not present

## 2016-04-10 DIAGNOSIS — I679 Cerebrovascular disease, unspecified: Secondary | ICD-10-CM

## 2016-04-10 DIAGNOSIS — H401111 Primary open-angle glaucoma, right eye, mild stage: Secondary | ICD-10-CM | POA: Diagnosis not present

## 2016-04-10 DIAGNOSIS — H16223 Keratoconjunctivitis sicca, not specified as Sjogren's, bilateral: Secondary | ICD-10-CM | POA: Diagnosis not present

## 2016-04-10 DIAGNOSIS — H401121 Primary open-angle glaucoma, left eye, mild stage: Secondary | ICD-10-CM | POA: Diagnosis not present

## 2016-04-10 MED ORDER — LAMOTRIGINE 100 MG PO TABS: 100.0000 mg | ORAL_TABLET | Freq: Two times a day (BID) | ORAL | Status: DC

## 2016-04-10 NOTE — Progress Notes (Signed)
Chief Complaint  Patient presents with  . Left Hand Paresthesia    She is here with her daughter, Autumn Conley, to review her EEG and MRI results.   Chief Complaint  Patient presents with  . Left Hand Paresthesia    She is here with her daughter, Autumn Conley, to review her EEG and MRI results.     [facility]  PATIENT: Autumn Conley DOB: 11-27-1945  HISTORY OF PRESENT ILLNESS:Autumn Conley is a 70 yo RH female, is referred by her primary care Dr. Nena Jordan, IBTEHAL, accompanied by her daughter Autumn Conley for evaluation of headaches, Initial visit was February 2016.  She had a history of hypertension, hyperlipidemia, depression, left breast cancer, status post left lobectomy, bilateral glaucoma, was found during routine examination, receiving eyedrops.  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  In November 29 2014, she presented to the emergency room for 2 weeks history of persistent headaches, chest pain, had extensive evaluations,  I have reviewed MRI brain in Dec 01 2014: . No acute intracranial abnormality, moderate small vessel disease  Laboratory in February 12th 2016, mild elevated A1c 6.1, normal TSH, CBC, CMP.  Normal ultrasound of carotid artery, and echocardiogram December 02 2014.  She was put on Depakote IR 250 mg twice a day, she tolerated the medications well, but continue have  mild to moderate daily pressure headaches,  She also has history of depression, taking Paxil, she moved from [facility] to [place] in around 2010, lives at home with her husband, complains of mild memory trouble, word finding difficulties  UPDATE May 01 2015:She is with her husband at today's visit, she still has 2 headache a week, she still takes goody powder prn, but much less frequent, she uses cold compression,  She used imitrex 25 mg as needed, which was helpful, but sometimes she requires second dosage, she tried one time of 50 mg tablets of Imitrex, seems to work better, no significant side effect noticed. She was not sure the benefit of Depakote as headache prevention, does reported 10 pound weight gain over the past 3 months  Her preventive was changed from Depakote to nortriptyline in July 2016, and she is currently taking 20 mg at night. She has had 3 headaches in the last 3 months and is pleased with her response. She continues to use Imitrex acutely. She denies side effects to the medication. She returns for reevaluation  Update Mar 04 2016: Since May 9th 2017, she noticed intermittent left tongue, lips, left finger tip paresthesia, lasting 2-30 minutes, no loss of consciousness, no weakness, last night in Mar 03 2016, she had 7 episodes in 5 hours span  We have personally reviewed MRI of the brain without contrast February 2016, moderate atrophy, supratentorium small vessel disease  Her migraine headache has much improved, she only take Imitrex rarely, works well for her headache, occasionally she take [facility] powder as needed  UPDATE June 22nd 2017: Since last visit in Mar 04 2016, she documented 44 similar spells, set onset numbness involving her left upper lower lip, left hand, no weakness, no loss of consciousness, each episode last about 2-5 minutes, the longest one was about 10 minutes, but often clustered within one day.  She only has occasionally migraine  headaches, responding well to Fioricet  We have personally reviewed MRI of the brain without contrast in May 2017, mild to moderate generalized atrophy, supratentorium small vessel disease no acute lesions, EEG was normal  REVIEW OF SYSTEMS: Full 14 system review of systems performed and notable only for those listed, all others are neg:  As above ALLERGIES: No Known Allergies  HOME MEDICATIONS: Outpatient Prescriptions Prior to Visit  Medication Sig Dispense Refill  . aspirin EC 81 MG tablet Take 81 mg by mouth daily.    Marland Kitchen atorvastatin (LIPITOR) 20 MG tablet Take 20 mg by mouth daily.    . butalbital-acetaminophen-caffeine (FIORICET, ESGIC) 50-325-40 MG tablet Take 1 tablet by mouth every 6 (six) hours as needed for headache. 12 tablet 5  . Casanthranol-Docusate Sodium (STOOL SOFTENER PLUS PO) Take 1 tablet by mouth as needed (for constipation).     Marland Kitchen lamoTRIgine (LAMICTAL) 25 MG tablet One po bid xone week, 2 tabs po bid xone week, then 3 tabs po bid 180 tablet 6  . losartan (COZAAR) 25 MG tablet Take 25 mg by mouth daily.  6  . nortriptyline (PAMELOR) 10 MG capsule 2 tabs po qhs 180 capsule 3  . PARoxetine (PAXIL) 20 MG tablet Take 60 mg by mouth daily.     . timolol (BETIMOL) 0.5 % ophthalmic solution 1 drop daily.    . vitamin E 100 UNIT capsule Take 400 Units by mouth daily.    . Vitamin D, Ergocalciferol, (DRISDOL) 50000 UNITS CAPS capsule Take 50,000 Units by mouth once a week.  0   No facility-administered medications prior to visit.    PAST MEDICAL HISTORY: Past Medical History  Diagnosis Date  . Hypercholesteremia   . Hypertension   . IBS (irritable bowel syndrome)   . Cancer of left breast ([place])   . PONV (postoperative nausea and vomiting)   . Mitral valve prolapse   . GERD (gastroesophageal reflux disease)   . Headache     "weekly" (11/29/2014)  . Arthritis     "joints" (11/29/2014)  . Osteoarthritis   . Autoimmune disorder ([place])     "non-specific"  . Anxiety     . Depression   . Fibromyalgia     "some; not chronic" (11/29/2014)  . Glaucoma of both eyes   . NASH (nonalcoholic steatohepatitis)     PAST SURGICAL HISTORY: Past Surgical History  Procedure Laterality Date  . Appendectomy  1953  . Abdominal hysterectomy  1980  . Mastectomy Left ~ 2009  . Breast biopsy Left   . Breast lumpectomy Left     FAMILY HISTORY: Family History  Problem Relation Age of Onset  . Lung cancer Mother   . Prostate cancer Brother     SOCIAL HISTORY: Social History   Social History  . Marital Status: Married    Spouse Name: N/A  . Number of Children: 1  . Years of Education: 14   Occupational History  . Retired    Social History Main Topics  . Smoking status: Never Smoker   . Smokeless tobacco: Never Used  . Alcohol Use: No  . Drug Use: No  . Sexual Activity: No   Other Topics Concern  . Not on file  Social History Narrative   Lives at home with her husband.   Right-handed.   2 cups caffeine/day.     PHYSICAL EXAM  Filed Vitals:   04/10/16 0859  BP: 132/77  Pulse: 74  Height: 5' 4"  (1.626 m)  Weight: 143 lb (64.864 kg)   Body mass index is 24.53 kg/(m^2).  PHYSICAL EXAMNIATION:  Gen: NAD, conversant, well nourised, obese, well groomed                     Cardiovascular: Regular rate rhythm, no peripheral edema, warm, nontender. Eyes: Conjunctivae clear without exudates or hemorrhage Neck: Supple, no carotid bruise. Pulmonary: Clear to auscultation bilaterally   NEUROLOGICAL EXAM:  MENTAL STATUS: Speech:    Speech is normal; fluent and spontaneous with normal comprehension.  Cognition:     Orientation to time, place and person     Normal recent and remote memory     Normal Attention span and concentration     Normal Language, naming, repeating,spontaneous speech     Fund of knowledge   CRANIAL NERVES: CN II: Visual fields are full to confrontation. Fundoscopic exam is normal with sharp discs and no vascular  changes. Pupils are round equal and briskly reactive to light. CN III, IV, VI: extraocular movement are normal. No ptosis. CN V: Facial sensation is intact to pinprick in all 3 divisions bilaterally. Corneal responses are intact.  CN VII: Face is symmetric with normal eye closure and smile. CN VIII: Hearing is normal to rubbing fingers CN IX, X: Palate elevates symmetrically. Phonation is normal. CN XI: Head turning and shoulder shrug are intact CN XII: Tongue is midline with normal movements and no atrophy.  MOTOR: There is no pronator drift of out-stretched arms. Muscle bulk and tone are normal. Muscle strength is normal.  REFLEXES: Reflexes are 2+ and symmetric at the biceps, triceps, knees, and ankles. Plantar responses are flexor.  SENSORY: Intact to light touch, pinprick, positional and vibratory sensation are intact in fingers and toes.  COORDINATION: Rapid alternating movements and fine finger movements are intact. There is no dysmetria on finger-to-nose and heel-knee-shin.    GAIT/STANCE: Posture is normal. Gait is steady with normal steps, base, arm swing, and turning. Heel and toe walking are normal. Tandem gait is normal.  Romberg is absent.    DIAGNOSTIC DATA (LABS, IMAGING, TESTING)   ASSESSMENT AND PLAN 70 year old female   Chronic migraine   Responding well to nortriptyline 10 mg 2 tablets every night , may tapering down to 1 tablet every night  Fioricet as needed  New onset intermittent left facial, left hand paresthesia  Differentiation diagnosis remain partial seizure,  Lamotrigine 75 mg twice a day seems to help her spell mildly, she had intervals of few days without recurrent spells,  I will increase lamotrigine to 100 mg twice a day, continue to document the spells.   History of moderate cerebral small vessel disease  Keep daily aspirin, keep well hydration  Continue moderate exercise  Previously normal echocardiogram, no significant stenosis at  ultrasound of carotid artery February 2016  Marcial Pacas, M.D. Ph.D.  [facility] [place], [place] [address] Phone: [phone] Fax:      [phone]

## 2016-04-13 DIAGNOSIS — L239 Allergic contact dermatitis, unspecified cause: Secondary | ICD-10-CM | POA: Diagnosis not present

## 2016-04-13 DIAGNOSIS — L506 Contact urticaria: Secondary | ICD-10-CM | POA: Diagnosis not present

## 2016-04-14 DIAGNOSIS — S90862A Insect bite (nonvenomous), left foot, initial encounter: Secondary | ICD-10-CM | POA: Diagnosis not present

## 2016-06-12 DIAGNOSIS — M65351 Trigger finger, right little finger: Secondary | ICD-10-CM | POA: Diagnosis not present

## 2016-06-24 DIAGNOSIS — Z853 Personal history of malignant neoplasm of breast: Secondary | ICD-10-CM | POA: Diagnosis not present

## 2016-06-24 DIAGNOSIS — G43009 Migraine without aura, not intractable, without status migrainosus: Secondary | ICD-10-CM | POA: Diagnosis not present

## 2016-06-24 DIAGNOSIS — E785 Hyperlipidemia, unspecified: Secondary | ICD-10-CM | POA: Diagnosis not present

## 2016-06-24 DIAGNOSIS — I10 Essential (primary) hypertension: Secondary | ICD-10-CM | POA: Diagnosis not present

## 2016-06-24 DIAGNOSIS — Z23 Encounter for immunization: Secondary | ICD-10-CM | POA: Diagnosis not present

## 2016-06-24 DIAGNOSIS — M797 Fibromyalgia: Secondary | ICD-10-CM | POA: Diagnosis not present

## 2016-06-24 DIAGNOSIS — F418 Other specified anxiety disorders: Secondary | ICD-10-CM | POA: Diagnosis not present

## 2016-06-25 ENCOUNTER — Other Ambulatory Visit: Payer: Self-pay | Admitting: *Deleted

## 2016-06-25 ENCOUNTER — Telehealth: Payer: Self-pay | Admitting: Neurology

## 2016-06-25 MED ORDER — NORTRIPTYLINE HCL 10 MG PO CAPS: ORAL_CAPSULE | ORAL | 0 refills | Status: DC

## 2016-06-25 NOTE — Telephone Encounter (Signed)
Rx sent to [facility]

## 2016-06-25 NOTE — Telephone Encounter (Signed)
Patient called to request refill of mail order Rx nortriptyline (PAMELOR) 10 MG capsule, states she is completely out of this medication and requests a couple of weeks or month sent to [facility] in [place].

## 2016-06-26 ENCOUNTER — Other Ambulatory Visit: Payer: Self-pay | Admitting: Nurse Practitioner

## 2016-07-15 ENCOUNTER — Ambulatory Visit: Payer: Medicare Other | Admitting: Neurology

## 2016-07-15 ENCOUNTER — Encounter: Payer: Self-pay | Admitting: Neurology

## 2016-07-15 ENCOUNTER — Ambulatory Visit (INDEPENDENT_AMBULATORY_CARE_PROVIDER_SITE_OTHER): Payer: Medicare Other | Admitting: Neurology

## 2016-07-15 VITALS — BP 148/88 | HR 76 | Ht 64.0 in | Wt 142.2 lb

## 2016-07-15 DIAGNOSIS — I679 Cerebrovascular disease, unspecified: Secondary | ICD-10-CM | POA: Diagnosis not present

## 2016-07-15 DIAGNOSIS — R202 Paresthesia of skin: Secondary | ICD-10-CM | POA: Diagnosis not present

## 2016-07-15 DIAGNOSIS — G43009 Migraine without aura, not intractable, without status migrainosus: Secondary | ICD-10-CM

## 2016-07-15 MED ORDER — NORTRIPTYLINE HCL 10 MG PO CAPS: ORAL_CAPSULE | ORAL | 4 refills | Status: DC

## 2016-07-15 NOTE — Progress Notes (Signed)
Chief Complaint  Patient presents with  . Left facial/Left Paresthesia    She has only noticed a mild improvement in symptoms since increasing her Lamictal to 124m, BID.  She is still having frequent episodes.   Chief Complaint  Patient presents with  . Left facial/Left Paresthesia    She has only noticed a mild improvement in symptoms since increasing her Lamictal to 1062m BID.  She is still having frequent episodes.     [facility]  PATIENT: Autumn BotzOB: [birth year]HISTORY OF PRESENT ILLNESS:Autumn ElMeluccis a 70 RH female, is referred by her primary care Dr. JANena JordanIBTEHAL, accompanied by her daughter Autumn Kluveror evaluation of headaches, Initial visit was February 2016.  She had a history of hypertension, hyperlipidemia, depression, left breast cancer, status post left lobectomy, bilateral glaucoma, was found during routine examination, receiving eyedrops.  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  In November 29 2014, she presented to the emergency room for 2 weeks history of persistent headaches, chest pain, had extensive evaluations,  I have reviewed MRI brain in Dec 01 2014: . No acute intracranial abnormality, moderate small vessel disease  Laboratory in February 12th 2016, mild elevated A1c 6.1, normal TSH, CBC, CMP.  Normal ultrasound of carotid artery,  and echocardiogram December 02 2014.  She was put on Depakote IR 250 mg twice a day, she tolerated the medications well, but continue have mild to moderate daily pressure headaches,  She also has history of depression, taking Paxil, [place] lives at home with her husband, complains of mild memory trouble, word finding difficulties  UPDATE May 01 2015:She is with her husband at today's visit, she still has 2 headache a week, she still takes goody powder prn, but much less frequent, she uses cold compression,  She used imitrex 25 mg as needed, which was helpful, but sometimes she requires second dosage, she tried one time of 50 mg tablets of Imitrex, seems to work better, no significant side effect noticed. She was not sure the benefit of Depakote as headache prevention, does reported 10 pound weight gain over the past 3 months  Her preventive was changed from Depakote to nortriptyline in July 2016, and she is currently taking 20 mg at night. She has had 3 headaches in the last 3 months and is pleased with her response. She continues to use Imitrex 5034ms needed   acutely. She denies side effects to the medication. She returns for reevaluation  Update Mar 04 2016: Since May 9th 2017, she noticed intermittent left tongue, lips, left finger tip paresthesia, lasting 2-30 minutes, no loss of consciousness, no weakness, last night in Mar 03 2016, she had 7 episodes in 5 hours span  We have personally reviewed MRI of the brain without contrast February 2016, moderate atrophy, supratentorium small vessel disease  Her migraine headache has much improved, she only take Imitrex  rarely, works well for her headache, occasionally she take [facility] powder as needed  UPDATE June 22nd 2017: Since last visit in Mar 04 2016, she documented 44 similar spells, set onset numbness involving her left upper lower lip, left hand, no weakness, no loss of  consciousness, each episode last about 2-5 minutes, the longest one was about 10 minutes, but often clustered within one day.  She only has occasionally migraine headaches, responding well to Fioricet  We have personally reviewed MRI of the brain without contrast in May 2017, mild to moderate generalized atrophy, supratentorium small vessel disease no acute lesions, EEG was normal]  UPDATE Sept 26th 2017: She is alone at visit today, she noticed mildly off balance, she denies significant dizziness, she seems to be clumsy.  She still has headache intermittently 2-3 times each week, Fioricet has been helpful, she continues to have intermittent left upper and lower lip numbness sometimes also involving her left hand, despite taking lamotrigine 100 mg twice a day  REVIEW OF SYSTEMS: Full 14 system review of systems performed and notable only for those listed, all others are neg:  Eye itching, light sensitivity, hearing loss, ringing ears, constipation, diarrhea, acne, joint pain, joint swelling, neck stiffness, memory loss, headaches, numbness, speech difficulty  ALLERGIES: No Known Allergies  HOME MEDICATIONS: Outpatient Medications Prior to Visit  Medication Sig Dispense Refill  . aspirin EC 81 MG tablet Take 81 mg by mouth daily.    Marland Kitchen atorvastatin (LIPITOR) 20 MG tablet Take 20 mg by mouth daily.    . butalbital-acetaminophen-caffeine (FIORICET, ESGIC) 50-325-40 MG tablet Take 1 tablet by mouth every 6 (six) hours as needed for headache. 12 tablet 5  . lamoTRIgine (LAMICTAL) 100 MG tablet Take 1 tablet (100 mg total) by mouth 2 (two) times daily. 60 tablet 11  . losartan (COZAAR) 25 MG tablet Take 25 mg by mouth daily.  6  . nortriptyline (PAMELOR) 10 MG capsule 2 tabs po qhs 60 capsule 0  . PARoxetine (PAXIL) 20 MG tablet Take 60 mg by mouth daily.     . timolol (BETIMOL) 0.5 % ophthalmic solution 1 drop daily.    . vitamin E 100 UNIT capsule Take 400 Units by mouth daily.    Sarajane Marek Sodium (STOOL SOFTENER PLUS PO) Take 1 tablet by mouth as needed (for constipation).     . nortriptyline (PAMELOR) 10 MG capsule Take 1 capsule by mouth  every night at bedtime for  one week then increase to 2 capsules by mouth by mouth  every night at bedtime 180 capsule 1   No facility-administered medications prior to visit.     PAST MEDICAL HISTORY: Past Medical History:  Diagnosis Date  . Anxiety   . Arthritis    "joints" (11/29/2014)  . Autoimmune disorder ([place])    "non-specific"  . Cancer of left breast ([place])   . Depression   . Fibromyalgia    "some; not chronic" (11/29/2014)  . GERD (gastroesophageal reflux disease)   . Glaucoma of both eyes   . Headache    "weekly" (11/29/2014)  . Hypercholesteremia   . Hypertension   . IBS (irritable bowel syndrome)   . Mitral valve prolapse   . NASH (nonalcoholic steatohepatitis)   . Osteoarthritis   . PONV (postoperative nausea and vomiting)     PAST SURGICAL HISTORY: Past Surgical History:  Procedure Laterality Date  . ABDOMINAL HYSTERECTOMY  1980  . APPENDECTOMY  1953  . BREAST BIOPSY Left   . BREAST  LUMPECTOMY Left   . MASTECTOMY Left ~ 2009    FAMILY HISTORY: Family History  Problem Relation Age of Onset  . Lung cancer Mother   . Prostate cancer Brother     SOCIAL HISTORY: Social History   Social History  . Marital status: Married    Spouse name: N/A  . Number of children: 1  . Years of education: 44   Occupational History  . Retired    Social History Main Topics  . Smoking status: Never Smoker  . Smokeless tobacco: Never Used  . Alcohol use No  . Drug use: No  . Sexual activity: No   Other Topics Concern  . Not on file   Social History Narrative   Lives at home with her husband.   Right-handed.   2 cups caffeine/day.     PHYSICAL EXAM  Vitals:   07/15/16 1057  BP: (!) 148/88  Pulse: 76  Weight: 142 lb 4 oz (64.5 kg)  Height: 5' 4"  (1.626 m)   Body mass index is  24.42 kg/m.  PHYSICAL EXAMNIATION:  Gen: NAD, conversant, well nourised, obese, well groomed                     Cardiovascular: Regular rate rhythm, no peripheral edema, warm, nontender. Eyes: Conjunctivae clear without exudates or hemorrhage Neck: Supple, no carotid bruise. Pulmonary: Clear to auscultation bilaterally   NEUROLOGICAL EXAM:  MENTAL STATUS: Speech:    Speech is normal; fluent and spontaneous with normal comprehension.  Cognition:     Orientation to time, place and person     Normal recent and remote memory     Normal Attention span and concentration     Normal Language, naming, repeating,spontaneous speech     Fund of knowledge   CRANIAL NERVES: CN II: Visual fields are full to confrontation. Fundoscopic exam is normal with sharp discs and no vascular changes. Pupils are round equal and briskly reactive to light. CN III, IV, VI: extraocular movement are normal. No ptosis. CN V: Facial sensation is intact to pinprick in all 3 divisions bilaterally. Corneal responses are intact.  CN VII: Face is symmetric with normal eye closure and smile. CN VIII: Hearing is normal to rubbing fingers CN IX, X: Palate elevates symmetrically. Phonation is normal. CN XI: Head turning and shoulder shrug are intact CN XII: Tongue is midline with normal movements and no atrophy.  MOTOR: There is no pronator drift of out-stretched arms. Muscle bulk and tone are normal. Muscle strength is normal.  REFLEXES: Reflexes are 2+ and symmetric at the biceps, triceps, knees, and ankles. Plantar responses are flexor.  SENSORY: Intact to light touch, pinprick, positional and vibratory sensation are intact in fingers and toes.  COORDINATION: Rapid alternating movements and fine finger movements are intact. There is no dysmetria on finger-to-nose and heel-knee-shin.    GAIT/STANCE: Posture is normal. Gait is steady with normal steps, base, arm swing, and turning. Heel and toe walking are  normal. Tandem gait is normal.  Romberg is absent.    DIAGNOSTIC DATA (LABS, IMAGING, TESTING)   ASSESSMENT AND PLAN 70 year old female   Chronic migraine   Responding well to nortriptyline 10 mg 2 tablets every night ,  Fioricet as needed  Not a good candidate for triptan treatment because of cerebral small vessel disease Left facial, left hand paresthesia  Lamotrigine 100 mg twice a day did not help her left-sided paresthesia, she continue has more than 30 episodes each months,  also complains of mild unsteady gait. I have advised her stop lamotrigine,    History of moderate cerebral small vessel disease  Keep daily aspirin, keep well hydration  Continue moderate exercise  Previously normal echocardiogram, no significant stenosis at ultrasound of carotid artery February 2016  Mild unsteady gait:  Mild unsteady gait, hyperreflexia, could indicate a cervical pathology, if her gait continued to worsen consider MRI of cervical spine  Return to clinic with nurse practitioner in 6 months  Marcial Pacas, M.D. Ph.D.  [facility] [place], [place] [address] Phone: [phone] Fax:      [phone]

## 2016-09-18 DIAGNOSIS — H04123 Dry eye syndrome of bilateral lacrimal glands: Secondary | ICD-10-CM | POA: Diagnosis not present

## 2016-09-18 DIAGNOSIS — H16223 Keratoconjunctivitis sicca, not specified as Sjogren's, bilateral: Secondary | ICD-10-CM | POA: Diagnosis not present

## 2016-10-20 HISTORY — PX: CATARACT EXTRACTION, BILATERAL: SHX1313

## 2016-11-26 DIAGNOSIS — Z0000 Encounter for general adult medical examination without abnormal findings: Secondary | ICD-10-CM | POA: Diagnosis not present

## 2016-11-26 DIAGNOSIS — Z1389 Encounter for screening for other disorder: Secondary | ICD-10-CM | POA: Diagnosis not present

## 2016-11-26 DIAGNOSIS — F418 Other specified anxiety disorders: Secondary | ICD-10-CM | POA: Diagnosis not present

## 2016-11-26 DIAGNOSIS — Z1211 Encounter for screening for malignant neoplasm of colon: Secondary | ICD-10-CM | POA: Diagnosis not present

## 2016-11-26 DIAGNOSIS — Z1231 Encounter for screening mammogram for malignant neoplasm of breast: Secondary | ICD-10-CM | POA: Diagnosis not present

## 2016-11-26 DIAGNOSIS — E785 Hyperlipidemia, unspecified: Secondary | ICD-10-CM | POA: Diagnosis not present

## 2016-11-26 DIAGNOSIS — M797 Fibromyalgia: Secondary | ICD-10-CM | POA: Diagnosis not present

## 2016-11-26 DIAGNOSIS — Z853 Personal history of malignant neoplasm of breast: Secondary | ICD-10-CM | POA: Diagnosis not present

## 2016-11-26 DIAGNOSIS — I10 Essential (primary) hypertension: Secondary | ICD-10-CM | POA: Diagnosis not present

## 2016-11-26 DIAGNOSIS — G4733 Obstructive sleep apnea (adult) (pediatric): Secondary | ICD-10-CM | POA: Diagnosis not present

## 2016-11-26 DIAGNOSIS — G43009 Migraine without aura, not intractable, without status migrainosus: Secondary | ICD-10-CM | POA: Diagnosis not present

## 2016-12-16 DIAGNOSIS — Z1211 Encounter for screening for malignant neoplasm of colon: Secondary | ICD-10-CM | POA: Diagnosis not present

## 2016-12-16 DIAGNOSIS — Z1212 Encounter for screening for malignant neoplasm of rectum: Secondary | ICD-10-CM | POA: Diagnosis not present

## 2017-01-13 ENCOUNTER — Ambulatory Visit: Payer: Medicare Other | Admitting: Nurse Practitioner

## 2017-02-04 ENCOUNTER — Ambulatory Visit: Payer: Medicare Other | Admitting: Nurse Practitioner

## 2017-02-04 DIAGNOSIS — H401131 Primary open-angle glaucoma, bilateral, mild stage: Secondary | ICD-10-CM | POA: Diagnosis not present

## 2017-02-04 DIAGNOSIS — H25013 Cortical age-related cataract, bilateral: Secondary | ICD-10-CM | POA: Diagnosis not present

## 2017-02-04 DIAGNOSIS — H2513 Age-related nuclear cataract, bilateral: Secondary | ICD-10-CM | POA: Diagnosis not present

## 2017-02-04 DIAGNOSIS — H35033 Hypertensive retinopathy, bilateral: Secondary | ICD-10-CM | POA: Diagnosis not present

## 2017-03-08 ENCOUNTER — Other Ambulatory Visit: Payer: Self-pay | Admitting: Neurology

## 2017-03-17 ENCOUNTER — Other Ambulatory Visit: Payer: Self-pay | Admitting: Neurology

## 2017-03-26 DIAGNOSIS — G4733 Obstructive sleep apnea (adult) (pediatric): Secondary | ICD-10-CM | POA: Diagnosis not present

## 2017-04-16 DIAGNOSIS — H401131 Primary open-angle glaucoma, bilateral, mild stage: Secondary | ICD-10-CM | POA: Diagnosis not present

## 2017-04-16 DIAGNOSIS — H01119 Allergic dermatitis of unspecified eye, unspecified eyelid: Secondary | ICD-10-CM | POA: Diagnosis not present

## 2017-04-16 DIAGNOSIS — H04123 Dry eye syndrome of bilateral lacrimal glands: Secondary | ICD-10-CM | POA: Diagnosis not present

## 2017-04-16 DIAGNOSIS — H16223 Keratoconjunctivitis sicca, not specified as Sjogren's, bilateral: Secondary | ICD-10-CM | POA: Diagnosis not present

## 2017-07-28 DIAGNOSIS — Z23 Encounter for immunization: Secondary | ICD-10-CM | POA: Diagnosis not present

## 2017-08-11 DIAGNOSIS — R635 Abnormal weight gain: Secondary | ICD-10-CM | POA: Diagnosis not present

## 2017-08-11 DIAGNOSIS — H5789 Other specified disorders of eye and adnexa: Secondary | ICD-10-CM | POA: Diagnosis not present

## 2017-08-11 DIAGNOSIS — I10 Essential (primary) hypertension: Secondary | ICD-10-CM | POA: Diagnosis not present

## 2017-08-20 DIAGNOSIS — H35033 Hypertensive retinopathy, bilateral: Secondary | ICD-10-CM | POA: Diagnosis not present

## 2017-08-20 DIAGNOSIS — H2513 Age-related nuclear cataract, bilateral: Secondary | ICD-10-CM | POA: Diagnosis not present

## 2017-08-20 DIAGNOSIS — H401131 Primary open-angle glaucoma, bilateral, mild stage: Secondary | ICD-10-CM | POA: Diagnosis not present

## 2017-08-20 DIAGNOSIS — H2512 Age-related nuclear cataract, left eye: Secondary | ICD-10-CM | POA: Diagnosis not present

## 2017-08-20 DIAGNOSIS — H25013 Cortical age-related cataract, bilateral: Secondary | ICD-10-CM | POA: Diagnosis not present

## 2017-08-20 DIAGNOSIS — H25012 Cortical age-related cataract, left eye: Secondary | ICD-10-CM | POA: Diagnosis not present

## 2017-08-24 ENCOUNTER — Other Ambulatory Visit: Payer: Self-pay | Admitting: Neurology

## 2017-09-15 DIAGNOSIS — H25812 Combined forms of age-related cataract, left eye: Secondary | ICD-10-CM | POA: Diagnosis not present

## 2017-09-15 DIAGNOSIS — H2512 Age-related nuclear cataract, left eye: Secondary | ICD-10-CM | POA: Diagnosis not present

## 2017-09-25 DIAGNOSIS — H2511 Age-related nuclear cataract, right eye: Secondary | ICD-10-CM | POA: Diagnosis not present

## 2017-10-06 DIAGNOSIS — H2511 Age-related nuclear cataract, right eye: Secondary | ICD-10-CM | POA: Diagnosis not present

## 2017-10-06 DIAGNOSIS — H25011 Cortical age-related cataract, right eye: Secondary | ICD-10-CM | POA: Diagnosis not present

## 2017-10-29 NOTE — Progress Notes (Signed)
[facility]  PATIENT: Autumn Conley DOB: Feb 26, 1946   REASON FOR VISIT: Follow-up for headaches/migraines HISTORY FROM: Patient and daughter    HISTORY OF PRESENT ILLNESS: Mckinzey Conley is a 72 yo RH female, is referred by her primary care Dr. Nena Jordan, IBTEHAL, accompanied by her daughter [name] for evaluation of headaches, Initial visit was February 2016.  She had a history of hypertension, hyperlipidemia, depression, left breast cancer, status post left lobectomy, bilateral glaucoma, was found during routine examination, receiving eyedrops.  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  In November 29 2014, she presented to the emergency room for 2 weeks history of persistent headaches, chest pain, had extensive evaluations,  I have reviewed MRI brain in Dec 01 2014: . No acute intracranial abnormality, moderate small vessel disease  Laboratory in February 12th 2016, mild elevated A1c 6.1, normal TSH, CBC, CMP.  Normal ultrasound of carotid artery, and echocardiogram December 02 2014.  She was put on Depakote IR 250 mg twice a day, she tolerated the medications well, but continue have mild to moderate daily pressure headaches,  She also has history of depression, taking Paxil, she moved from [facility] to [place] in around 2010, lives at home with  her husband, complains of mild memory trouble, word finding difficulties  UPDATE May 01 2015:She is with her husband at today's visit, she still has 2 headache a week, she still takes goody powder prn, but much less frequent, she uses cold compression,  She used imitrex 25 mg as needed, which was helpful, but sometimes she requires second dosage, she tried one time of 50 mg tablets of Imitrex, seems to work better, no significant side effect noticed. She was not sure the benefit of Depakote as headache prevention, does reported 10 pound weight gain over the past 3 months  Her preventive was changed from Depakote to nortriptyline in July 2016, and she is currently taking 20 mg at night. She has had 3 headaches in the last 3 months and is pleased with her response. She continues to use Imitrex 64m as needed   acutely. She denies side effects to the medication. She returns for reevaluation  Update Mar 04 2016: Since May 9th 2017, she noticed intermittent left tongue, lips, left finger tip paresthesia, lasting 2-30 minutes, no loss of consciousness, no weakness, last night in Mar 03 2016, she had 7 episodes in 5 hours span  We have personally reviewed MRI of the brain without contrast February 2016, moderate atrophy, supratentorium small vessel disease  Her migraine headache has much improved, she only take Imitrex rarely, works well for her headache, [facility]  UPDATE June 22nd 2017: Since last visit in Mar 04 2016, she documented 44 similar spells, set onset numbness involving her left upper lower lip, left hand, no weakness, no loss of consciousness, each episode last about 2-5 minutes, the longest one was about 10 minutes, but often clustered  within one day.  She only has occasionally migraine headaches, responding well to Fioricet  We have personally reviewed MRI of the brain without contrast in May 2017, mild to moderate generalized atrophy,  supratentorium small vessel disease no acute lesions, EEG was normal]  UPDATE Sept 26th 2017: She is alone at visit today, she noticed mildly off balance, she denies significant dizziness, she seems to be clumsy.  She still has headache intermittently 2-3 times each week, Fioricet has been helpful, she continues to have intermittent left upper and lower lip numbness sometimes also involving her left hand, despite taking lamotrigine 100 mg twice a day UPDATE 1/11/2019CM Ms. Ellett, 72 year old female returns for follow-up with history of migraine headaches.  She has been doing fair with her headaches she has had a couple of migraines in the last year but they can last up to 2 days.  She takes Fioricet acutely.  Her Imitrex was stopped due to small vessel disease.  She denies any falls and no feelings of being off balance.  She denies any further intermittent left upper and lower lip numbness after stopping lamotrigine. Normal echocardiogram, no significant stenosis at ultrasound of carotid artery February 2016.  MRI May 2017 chronic microvascular ischemic changes but no acute findings.  She returns for reevaluation  REVIEW OF SYSTEMS: Full 14 system review of systems performed and notable only for those listed, all others are neg:  Constitutional: neg  Cardiovascular: neg Ear/Nose/Throat: neg  Skin: neg Eyes: neg Respiratory: neg Gastroitestinal: neg  Hematology/Lymphatic: neg  Endocrine: neg Musculoskeletal:neg Allergy/Immunology: neg Neurological: neg Psychiatric: neg Sleep : neg   ALLERGIES: No Known Allergies  HOME MEDICATIONS: Outpatient Medications Prior to Visit  Medication Sig Dispense Refill  . aspirin EC 81 MG tablet Take 81 mg by mouth daily.    Marland Kitchen atorvastatin (LIPITOR) 20 MG tablet Take 20 mg by mouth daily.    . butalbital-acetaminophen-caffeine (FIORICET, ESGIC) 50-325-40 MG tablet Take 1 tablet by mouth every 6 (six) hours as needed for headache. Please call [phone] to  schedule an appt for continued refills. 12 tablet 0  . losartan (COZAAR) 25 MG tablet Take 25 mg by mouth daily.  6  . nortriptyline (PAMELOR) 10 MG capsule 2 tabs po qhs 180 capsule 4  . PARoxetine (PAXIL) 20 MG tablet Take 60 mg by mouth daily.     Orlie Dakin Sodium (PERI-COLACE PO) Take by mouth as needed.    . vitamin E 100 UNIT capsule Take 400 Units by mouth daily.    . timolol (BETIMOL) 0.5 % ophthalmic solution 1 drop daily.     No facility-administered medications prior to visit.     PAST MEDICAL HISTORY: Past Medical History:  Diagnosis Date  . Anxiety   . Arthritis    "joints" (11/29/2014)  . Autoimmune disorder ([place])    "non-specific"  . Cancer of left breast ([place])   . Depression   . Fibromyalgia    "some; not chronic" (11/29/2014)  . GERD (gastroesophageal reflux disease)   . Glaucoma of both eyes   . Headache    "weekly" (11/29/2014)  . Hypercholesteremia   . Hypertension   . IBS (irritable bowel syndrome)   . Mitral valve prolapse   . NASH (nonalcoholic steatohepatitis)   . Osteoarthritis   . PONV (postoperative nausea and vomiting)     PAST SURGICAL HISTORY: Past Surgical History:  Procedure Laterality Date  . ABDOMINAL HYSTERECTOMY  1980  . APPENDECTOMY  1953  . BREAST BIOPSY Left   .  BREAST LUMPECTOMY Left   . CATARACT EXTRACTION, BILATERAL  2018  . MASTECTOMY Left ~ 2009    FAMILY HISTORY: Family History  Problem Relation Age of Onset  . Lung cancer Mother   . Prostate cancer Brother     SOCIAL HISTORY: Social History   Socioeconomic History  . Marital status: Married    Spouse name: Not on file  . Number of children: 1  . Years of education: 64  . Highest education level: Not on file  Social Needs  . Financial resource strain: Not on file  . Food insecurity - worry: Not on file  . Food insecurity - inability: Not on file  . Transportation needs - medical: Not on file  . Transportation needs - non-medical: Not on file    Occupational History  . Occupation: Retired  Tobacco Use  . Smoking status: Never Smoker  . Smokeless tobacco: Never Used  Substance and Sexual Activity  . Alcohol use: No  . Drug use: No  . Sexual activity: No  Other Topics Concern  . Not on file  Social History Narrative   Lives at home with her husband.   Right-handed.   2 cups caffeine/day.     PHYSICAL EXAM  Vitals:   10/30/17 1053  BP: (!) 154/95  Pulse: 80  Weight: 160 lb (72.6 kg)   Body mass index is 27.46 kg/m.  Generalized: Well developed, mildly obese female in no acute distress  Head: normocephalic and atraumatic,. Oropharynx benign  Neck: Supple, no carotid bruits  Cardiac: Regular rate rhythm, no murmur  Musculoskeletal: No deformity   Neurological examination   Mentation: Alert oriented to time, place, history taking. Attention span and concentration appropriate. Recent and remote memory intact.  Follows all commands speech and language fluent.   Cranial nerve II-XII: Pupils were equal round reactive to light extraocular movements were full, visual field were full on confrontational test. Facial sensation and strength were normal. hearing was intact to finger rubbing bilaterally. Uvula tongue midline. head turning and shoulder shrug were normal and symmetric.Tongue protrusion into cheek strength was normal. Motor: normal bulk and tone, full strength in the BUE, BLE, fine finger movements normal, no pronator drift. No focal weakness Sensory: normal and symmetric to light touch,  Coordination: finger-nose-finger, heel-to-shin bilaterally, no dysmetria Reflexes: Brachioradialis 2/2, biceps 2/2, triceps 2/2, patellar 2/2, Achilles 2/2, plantar responses were flexor bilaterally. Gait and Station: Rising up from seated position without assistance, normal stance,  moderate stride, good arm swing, smooth turning, able to perform tiptoe, and heel walking without difficulty. Tandem gait is steady.  No assistive  device  DIAGNOSTIC DATA (LABS, IMAGING, TESTING) - I reviewed patient records, labs, notes, testing and imaging myself where available.  Lab Results  Component Value Date   WBC 7.2 11/30/2014   HGB 12.0 11/30/2014   HCT 34.3 (L) 11/30/2014   MCV 88.6 11/30/2014   PLT 224 11/30/2014      Component Value Date/Time   NA 137 11/30/2014 0526   K 3.5 11/30/2014 0526   CL 105 11/30/2014 0526   CO2 29 11/30/2014 0526   GLUCOSE 128 (H) 11/30/2014 0526   BUN 14 11/30/2014 0526   CREATININE 0.77 11/30/2014 0526   CALCIUM 8.9 11/30/2014 0526   PROT 6.8 11/30/2014 0526   ALBUMIN 3.6 11/30/2014 0526   AST 32 11/30/2014 0526   ALT 42 (H) 11/30/2014 0526   ALKPHOS 96 11/30/2014 0526   BILITOT 0.8 11/30/2014 0526   GFRNONAA 84 (L)  11/30/2014 0526   GFRAA >90 11/30/2014 0526    Lab Results  Component Value Date   HGBA1C 6.1 (H) 11/30/2014   No results found for: [id number] Lab Results  Component Value Date   TSH 0.988 11/29/2014      ASSESSMENT AND PLAN   72 year old femalewith history of chronic migraine who is responded well to nortriptyline 20 mg every night and Fioricet as needed.  Not a good candidate for triptan's due to small vessel disease.  Patient is no longer having left facial left hand paresthesias and she has stopped lamotrigine.              PLAN: Continue aspirin daily for cerebral small vessel disease Continue nortriptyline 10 mg 2 tablets every at bedtime will refill Continue Fioricet as needed will refill Get back into exercise program by walking Follow-up yearly Dennie Bible, [facility], [facility], APRN  [facility] [address], [place], [place] [address] [phone]

## 2017-10-30 ENCOUNTER — Ambulatory Visit (INDEPENDENT_AMBULATORY_CARE_PROVIDER_SITE_OTHER): Payer: Medicare Other | Admitting: Nurse Practitioner

## 2017-10-30 ENCOUNTER — Encounter: Payer: Self-pay | Admitting: Nurse Practitioner

## 2017-10-30 VITALS — BP 154/95 | HR 80 | Wt 160.0 lb

## 2017-10-30 DIAGNOSIS — G43009 Migraine without aura, not intractable, without status migrainosus: Secondary | ICD-10-CM

## 2017-10-30 DIAGNOSIS — I679 Cerebrovascular disease, unspecified: Secondary | ICD-10-CM | POA: Diagnosis not present

## 2017-10-30 MED ORDER — BUTALBITAL-APAP-CAFFEINE 50-325-40 MG PO TABS: 1.0000 | ORAL_TABLET | Freq: Four times a day (QID) | ORAL | 1 refills | Status: DC | PRN

## 2017-10-30 MED ORDER — NORTRIPTYLINE HCL 10 MG PO CAPS: ORAL_CAPSULE | ORAL | 3 refills | Status: DC

## 2017-10-30 NOTE — Patient Instructions (Signed)
Continue aspirin daily Continue nortriptyline 10 mg 2 tablets every at bedtime will refill Continue Fioricet as needed will refill Get back into exercise program by walking Follow-up yearly

## 2017-10-30 NOTE — [place] Notes (Signed)
I have reviewed and agreed above plan. 

## 2017-10-30 NOTE — Progress Notes (Signed)
Fioricet refill Rx successfully faxed to [facility], [place].

## 2017-12-01 DIAGNOSIS — F418 Other specified anxiety disorders: Secondary | ICD-10-CM | POA: Diagnosis not present

## 2017-12-01 DIAGNOSIS — Z1231 Encounter for screening mammogram for malignant neoplasm of breast: Secondary | ICD-10-CM | POA: Diagnosis not present

## 2017-12-01 DIAGNOSIS — Z1389 Encounter for screening for other disorder: Secondary | ICD-10-CM | POA: Diagnosis not present

## 2017-12-01 DIAGNOSIS — M85859 Other specified disorders of bone density and structure, unspecified thigh: Secondary | ICD-10-CM | POA: Diagnosis not present

## 2017-12-01 DIAGNOSIS — G4733 Obstructive sleep apnea (adult) (pediatric): Secondary | ICD-10-CM | POA: Diagnosis not present

## 2017-12-01 DIAGNOSIS — G43009 Migraine without aura, not intractable, without status migrainosus: Secondary | ICD-10-CM | POA: Diagnosis not present

## 2017-12-01 DIAGNOSIS — I10 Essential (primary) hypertension: Secondary | ICD-10-CM | POA: Diagnosis not present

## 2017-12-01 DIAGNOSIS — Z0000 Encounter for general adult medical examination without abnormal findings: Secondary | ICD-10-CM | POA: Diagnosis not present

## 2017-12-01 DIAGNOSIS — E785 Hyperlipidemia, unspecified: Secondary | ICD-10-CM | POA: Diagnosis not present

## 2017-12-01 DIAGNOSIS — Z853 Personal history of malignant neoplasm of breast: Secondary | ICD-10-CM | POA: Diagnosis not present

## 2017-12-04 LAB — BASIC METABOLIC PANEL
BUN: 12 (ref 4–21)
CREATININE: 0.7 (ref 0.5–1.1)
Glucose: 94
POTASSIUM: 4.2 (ref 3.4–5.3)
SODIUM: 141 (ref 137–147)

## 2017-12-04 LAB — LIPID PANEL
Cholesterol: 175 (ref 0–200)
HDL: 45 (ref 35–70)
LDL Cholesterol: 111
TRIGLYCERIDES: 95 (ref 40–160)

## 2017-12-04 LAB — HEPATIC FUNCTION PANEL
ALT: 19 (ref 7–35)
AST: 20 (ref 13–35)

## 2017-12-04 LAB — CBC AND DIFFERENTIAL
HCT: 34 — AB (ref 36–46)
Hemoglobin: 11.5 — AB (ref 12.0–16.0)
Platelets: 233 (ref 150–399)
WBC: 4.6

## 2017-12-08 DIAGNOSIS — Z1231 Encounter for screening mammogram for malignant neoplasm of breast: Secondary | ICD-10-CM | POA: Diagnosis not present

## 2017-12-08 DIAGNOSIS — Z853 Personal history of malignant neoplasm of breast: Secondary | ICD-10-CM | POA: Diagnosis not present

## 2017-12-08 DIAGNOSIS — M8588 Other specified disorders of bone density and structure, other site: Secondary | ICD-10-CM | POA: Diagnosis not present

## 2017-12-08 DIAGNOSIS — M810 Age-related osteoporosis without current pathological fracture: Secondary | ICD-10-CM | POA: Diagnosis not present

## 2017-12-08 LAB — HM DEXA SCAN

## 2017-12-08 LAB — HM MAMMOGRAPHY

## 2017-12-10 DIAGNOSIS — Z853 Personal history of malignant neoplasm of breast: Secondary | ICD-10-CM | POA: Diagnosis not present

## 2017-12-10 DIAGNOSIS — E785 Hyperlipidemia, unspecified: Secondary | ICD-10-CM | POA: Diagnosis not present

## 2017-12-10 DIAGNOSIS — I10 Essential (primary) hypertension: Secondary | ICD-10-CM | POA: Diagnosis not present

## 2018-01-08 DIAGNOSIS — I10 Essential (primary) hypertension: Secondary | ICD-10-CM | POA: Diagnosis not present

## 2018-01-08 DIAGNOSIS — E785 Hyperlipidemia, unspecified: Secondary | ICD-10-CM | POA: Diagnosis not present

## 2018-01-08 DIAGNOSIS — Z853 Personal history of malignant neoplasm of breast: Secondary | ICD-10-CM | POA: Diagnosis not present

## 2018-02-11 DIAGNOSIS — I10 Essential (primary) hypertension: Secondary | ICD-10-CM | POA: Diagnosis not present

## 2018-02-11 DIAGNOSIS — E785 Hyperlipidemia, unspecified: Secondary | ICD-10-CM | POA: Diagnosis not present

## 2018-02-11 DIAGNOSIS — Z853 Personal history of malignant neoplasm of breast: Secondary | ICD-10-CM | POA: Diagnosis not present

## 2018-03-12 DIAGNOSIS — H04123 Dry eye syndrome of bilateral lacrimal glands: Secondary | ICD-10-CM | POA: Diagnosis not present

## 2018-03-12 DIAGNOSIS — H16223 Keratoconjunctivitis sicca, not specified as Sjogren's, bilateral: Secondary | ICD-10-CM | POA: Diagnosis not present

## 2018-03-12 DIAGNOSIS — H26491 Other secondary cataract, right eye: Secondary | ICD-10-CM | POA: Diagnosis not present

## 2018-03-12 DIAGNOSIS — H401131 Primary open-angle glaucoma, bilateral, mild stage: Secondary | ICD-10-CM | POA: Diagnosis not present

## 2018-03-17 DIAGNOSIS — Z853 Personal history of malignant neoplasm of breast: Secondary | ICD-10-CM | POA: Diagnosis not present

## 2018-03-17 DIAGNOSIS — E785 Hyperlipidemia, unspecified: Secondary | ICD-10-CM | POA: Diagnosis not present

## 2018-03-17 DIAGNOSIS — I10 Essential (primary) hypertension: Secondary | ICD-10-CM | POA: Diagnosis not present

## 2018-03-23 DIAGNOSIS — Z853 Personal history of malignant neoplasm of breast: Secondary | ICD-10-CM | POA: Diagnosis not present

## 2018-03-23 DIAGNOSIS — I10 Essential (primary) hypertension: Secondary | ICD-10-CM | POA: Diagnosis not present

## 2018-03-23 DIAGNOSIS — E785 Hyperlipidemia, unspecified: Secondary | ICD-10-CM | POA: Diagnosis not present

## 2018-03-25 DIAGNOSIS — G4733 Obstructive sleep apnea (adult) (pediatric): Secondary | ICD-10-CM | POA: Diagnosis not present

## 2018-06-15 DIAGNOSIS — E785 Hyperlipidemia, unspecified: Secondary | ICD-10-CM | POA: Diagnosis not present

## 2018-06-15 DIAGNOSIS — I10 Essential (primary) hypertension: Secondary | ICD-10-CM | POA: Diagnosis not present

## 2018-06-15 DIAGNOSIS — Z853 Personal history of malignant neoplasm of breast: Secondary | ICD-10-CM | POA: Diagnosis not present

## 2018-07-19 DIAGNOSIS — Z23 Encounter for immunization: Secondary | ICD-10-CM | POA: Diagnosis not present

## 2018-08-09 ENCOUNTER — Other Ambulatory Visit: Payer: Self-pay | Admitting: *Deleted

## 2018-08-09 NOTE — Telephone Encounter (Signed)
Drug Registry checked, no listing.  Last received from [facility].  Pt is going to [facility].  Will not refill.

## 2018-09-01 DIAGNOSIS — R413 Other amnesia: Secondary | ICD-10-CM | POA: Diagnosis not present

## 2018-09-01 DIAGNOSIS — M797 Fibromyalgia: Secondary | ICD-10-CM | POA: Diagnosis not present

## 2018-09-01 DIAGNOSIS — G43709 Chronic migraine without aura, not intractable, without status migrainosus: Secondary | ICD-10-CM | POA: Diagnosis not present

## 2018-09-14 DIAGNOSIS — E785 Hyperlipidemia, unspecified: Secondary | ICD-10-CM | POA: Diagnosis not present

## 2018-09-14 DIAGNOSIS — Z853 Personal history of malignant neoplasm of breast: Secondary | ICD-10-CM | POA: Diagnosis not present

## 2018-09-14 DIAGNOSIS — I10 Essential (primary) hypertension: Secondary | ICD-10-CM | POA: Diagnosis not present

## 2018-10-14 DIAGNOSIS — G43709 Chronic migraine without aura, not intractable, without status migrainosus: Secondary | ICD-10-CM | POA: Diagnosis not present

## 2018-11-02 ENCOUNTER — Ambulatory Visit: Payer: Medicare Other | Admitting: Nurse Practitioner

## 2018-11-03 DIAGNOSIS — E785 Hyperlipidemia, unspecified: Secondary | ICD-10-CM | POA: Diagnosis not present

## 2018-11-03 DIAGNOSIS — I10 Essential (primary) hypertension: Secondary | ICD-10-CM | POA: Diagnosis not present

## 2018-11-03 DIAGNOSIS — Z853 Personal history of malignant neoplasm of breast: Secondary | ICD-10-CM | POA: Diagnosis not present

## 2018-11-11 DIAGNOSIS — J069 Acute upper respiratory infection, unspecified: Secondary | ICD-10-CM | POA: Diagnosis not present

## 2018-11-11 DIAGNOSIS — J040 Acute laryngitis: Secondary | ICD-10-CM | POA: Diagnosis not present

## 2018-11-25 DIAGNOSIS — R419 Unspecified symptoms and signs involving cognitive functions and awareness: Secondary | ICD-10-CM | POA: Insufficient documentation

## 2018-12-03 ENCOUNTER — Other Ambulatory Visit: Payer: Self-pay | Admitting: Nurse Practitioner

## 2018-12-07 ENCOUNTER — Other Ambulatory Visit: Payer: Self-pay

## 2018-12-07 DIAGNOSIS — G43009 Migraine without aura, not intractable, without status migrainosus: Secondary | ICD-10-CM

## 2018-12-07 DIAGNOSIS — Z853 Personal history of malignant neoplasm of breast: Secondary | ICD-10-CM | POA: Insufficient documentation

## 2018-12-07 DIAGNOSIS — G4733 Obstructive sleep apnea (adult) (pediatric): Secondary | ICD-10-CM | POA: Insufficient documentation

## 2018-12-08 ENCOUNTER — Encounter: Payer: Self-pay | Admitting: Family Medicine

## 2018-12-08 ENCOUNTER — Ambulatory Visit (INDEPENDENT_AMBULATORY_CARE_PROVIDER_SITE_OTHER): Payer: Medicare Other | Admitting: Family Medicine

## 2018-12-08 VITALS — BP 140/78 | HR 85 | Temp 98.6°F | Ht 64.0 in | Wt 160.0 lb

## 2018-12-08 DIAGNOSIS — R5383 Other fatigue: Secondary | ICD-10-CM

## 2018-12-08 DIAGNOSIS — Z1159 Encounter for screening for other viral diseases: Secondary | ICD-10-CM

## 2018-12-08 DIAGNOSIS — R945 Abnormal results of liver function studies: Secondary | ICD-10-CM | POA: Diagnosis not present

## 2018-12-08 DIAGNOSIS — E559 Vitamin D deficiency, unspecified: Secondary | ICD-10-CM | POA: Diagnosis not present

## 2018-12-08 DIAGNOSIS — Z23 Encounter for immunization: Secondary | ICD-10-CM | POA: Diagnosis not present

## 2018-12-08 DIAGNOSIS — E538 Deficiency of other specified B group vitamins: Secondary | ICD-10-CM | POA: Diagnosis not present

## 2018-12-08 DIAGNOSIS — E7800 Pure hypercholesterolemia, unspecified: Secondary | ICD-10-CM | POA: Diagnosis not present

## 2018-12-08 DIAGNOSIS — R7989 Other specified abnormal findings of blood chemistry: Secondary | ICD-10-CM

## 2018-12-08 DIAGNOSIS — J320 Chronic maxillary sinusitis: Secondary | ICD-10-CM | POA: Diagnosis not present

## 2018-12-08 DIAGNOSIS — G4733 Obstructive sleep apnea (adult) (pediatric): Secondary | ICD-10-CM | POA: Diagnosis not present

## 2018-12-08 DIAGNOSIS — Z9189 Other specified personal risk factors, not elsewhere classified: Secondary | ICD-10-CM | POA: Diagnosis not present

## 2018-12-08 DIAGNOSIS — K219 Gastro-esophageal reflux disease without esophagitis: Secondary | ICD-10-CM

## 2018-12-08 DIAGNOSIS — N393 Stress incontinence (female) (male): Secondary | ICD-10-CM | POA: Diagnosis not present

## 2018-12-08 DIAGNOSIS — J9801 Acute bronchospasm: Secondary | ICD-10-CM

## 2018-12-08 LAB — CBC WITH DIFFERENTIAL/PLATELET
Basophils Absolute: 0 10*3/uL (ref 0.0–0.1)
Basophils Relative: 0.8 % (ref 0.0–3.0)
Eosinophils Absolute: 0.1 10*3/uL (ref 0.0–0.7)
Eosinophils Relative: 1.5 % (ref 0.0–5.0)
HCT: 35.2 % — ABNORMAL LOW (ref 36.0–46.0)
Hemoglobin: 12.1 g/dL (ref 12.0–15.0)
Lymphocytes Relative: 32.3 % (ref 12.0–46.0)
Lymphs Abs: 1.2 10*3/uL (ref 0.7–4.0)
MCHC: 34.3 g/dL (ref 30.0–36.0)
MCV: 89.5 fl (ref 78.0–100.0)
Monocytes Absolute: 0.4 10*3/uL (ref 0.1–1.0)
Monocytes Relative: 10.6 % (ref 3.0–12.0)
Neutro Abs: 2.1 10*3/uL (ref 1.4–7.7)
Neutrophils Relative %: 54.8 % (ref 43.0–77.0)
Platelets: 232 10*3/uL (ref 150.0–400.0)
RBC: 3.93 Mil/uL (ref 3.87–5.11)
RDW: 14.1 % (ref 11.5–15.5)
WBC: 3.9 10*3/uL — ABNORMAL LOW (ref 4.0–10.5)

## 2018-12-08 LAB — TSH: TSH: 1.78 u[IU]/mL (ref 0.35–4.50)

## 2018-12-08 LAB — COMPREHENSIVE METABOLIC PANEL
ALT: 17 U/L (ref 0–35)
AST: 18 U/L (ref 0–37)
Albumin: 4.3 g/dL (ref 3.5–5.2)
Alkaline Phosphatase: 111 U/L (ref 39–117)
BUN: 12 mg/dL (ref 6–23)
CO2: 28 mEq/L (ref 19–32)
Calcium: 9.4 mg/dL (ref 8.4–10.5)
Chloride: 102 mEq/L (ref 96–112)
Creatinine, Ser: 0.72 mg/dL (ref 0.40–1.20)
GFR: 79.34 mL/min (ref >60.00)
Glucose, Bld: 83 mg/dL (ref 70–99)
Potassium: 4.5 mEq/L (ref 3.5–5.1)
Sodium: 138 mEq/L (ref 135–145)
Total Bilirubin: 0.4 mg/dL (ref 0.2–1.2)
Total Protein: 7.1 g/dL (ref 6.0–8.3)

## 2018-12-08 LAB — T4, FREE: Free T4: 0.7 ng/dL (ref 0.60–1.60)

## 2018-12-08 LAB — LIPID PANEL
Cholesterol: 166 mg/dL (ref 0–200)
HDL: 39.4 mg/dL (ref >39.00)
LDL Cholesterol: 87 mg/dL (ref 0–99)
NonHDL: 126.47
Total CHOL/HDL Ratio: 4
Triglycerides: 197 mg/dL — ABNORMAL HIGH (ref 0.0–149.0)
VLDL: 39.4 mg/dL (ref 0.0–40.0)

## 2018-12-08 LAB — VITAMIN D 25 HYDROXY (VIT D DEFICIENCY, FRACTURES): VITD: 13.98 ng/mL — ABNORMAL LOW (ref 30.00–100.00)

## 2018-12-08 LAB — VITAMIN B12: Vitamin B-12: 205 pg/mL — ABNORMAL LOW (ref 211–911)

## 2018-12-08 MED ORDER — AMOXICILLIN 875 MG PO TABS: 875.0000 mg | ORAL_TABLET | Freq: Two times a day (BID) | ORAL | 0 refills | Status: DC

## 2018-12-08 MED ORDER — TETANUS-DIPHTH-ACELL PERTUSSIS 5-2.5-18.5 LF-MCG/0.5 IM SUSP: 0.5000 mL | Freq: Once | INTRAMUSCULAR | 0 refills | Status: AC

## 2018-12-08 MED ORDER — FLUTICASONE PROPIONATE HFA 110 MCG/ACT IN AERO: 1.0000 | INHALATION_SPRAY | Freq: Two times a day (BID) | RESPIRATORY_TRACT | 12 refills | Status: DC

## 2018-12-08 NOTE — Progress Notes (Signed)
Autumn Conley is a 73 y.o. female is here to [place].   Patient Care Team: Briscoe Deutscher, DO as PCP - General (Family Medicine) Bo Merino, MD as Consulting Physician (Rheumatology) Monna Fam, MD as Consulting Physician (Ophthalmology) Amil Amen, MD as Referring Physician (Psychiatry) Marcial Pacas, MD as Consulting Physician (Neurology)   History of Present Illness:   HPI: New patient. I already see daughter and husband. Recent URI that lead to sinus pain and pressure, as well as cough in center chest that is not productive. No wheeze. Has stress incontinence sp causing accidents. Metabolic syndrome. Does not regularly exercise. Tolerates statin without myalgias. Due for labs.   Health Maintenance Due  Topic Date Due  . TETANUS/TDAP  08/23/1964  . COLONOSCOPY  08/24/1995  . PNA vac Low Risk Adult (2 of 2 - PPSV23) 08/15/2016   Depression screen PHQ 2/9 12/08/2018  Decreased Interest 0  Down, Depressed, Hopeless 0  PHQ - 2 Score 0  Altered sleeping 0  Tired, decreased energy 1  Change in appetite 0  Feeling bad or failure about yourself  0  Trouble concentrating 0  Moving slowly or fidgety/restless 0  Suicidal thoughts 0  PHQ-9 Score 1  Difficult doing work/chores Not difficult at all    PMHx, SurgHx, SocialHx, Medications, and Allergies were reviewed in the Visit Navigator and updated as appropriate.   Past Medical History:  Diagnosis Date  . Anxiety   . Arthritis    "joints" (11/29/2014)  . Autoimmune disorder ([place])    "non-specific"  . Cancer of left breast ([place])   . Depression   . Fibromyalgia    "some; not chronic" (11/29/2014)  . GERD (gastroesophageal reflux disease)   . Glaucoma of both eyes   . Headache    "weekly" (11/29/2014)  . Hypercholesteremia   . Hypertension   . IBS (irritable bowel syndrome)   . Mitral valve prolapse   . NASH (nonalcoholic steatohepatitis)   . Osteoarthritis   . PONV (postoperative nausea and vomiting)       Past Surgical History:  Procedure Laterality Date  . ABDOMINAL HYSTERECTOMY  1980  . APPENDECTOMY  1953  . BREAST BIOPSY Left   . BREAST LUMPECTOMY Left   . CATARACT EXTRACTION, BILATERAL  2018  . MASTECTOMY Left ~ 2009     Family History  Problem Relation Age of Onset  . Lung cancer Mother 40  . Prostate cancer Brother 55    Social History   Tobacco Use  . Smoking status: Never Smoker  . Smokeless tobacco: Never Used  Substance Use Topics  . Alcohol use: No  . Drug use: No    Current Medications and Allergies   Current Outpatient Medications:  .  aspirin EC 81 MG tablet, Take 81 mg by mouth daily., Disp: , Rfl:  .  atorvastatin (LIPITOR) 40 MG tablet, Take 1 tablet by mouth at bedtime., Disp: , Rfl:  .  gabapentin (NEURONTIN) 300 MG capsule, Take 1 capsule by mouth daily., Disp: , Rfl:  .  irbesartan (AVAPRO) 150 MG tablet, Take 1 tablet by mouth daily., Disp: , Rfl:  .  PARoxetine (PAXIL) 20 MG tablet, Take 60 mg by mouth daily. , Disp: , Rfl:  .  Sennosides-Docusate Sodium (PERI-COLACE PO), Take by mouth as needed., Disp: , Rfl:  .  SUMAtriptan (IMITREX) 100 MG tablet, Take 1 tablet by mouth 2 (two) times daily as needed. At least 2 hrs between doses as needed bid, Disp: , Rfl:  .  vitamin E 100 UNIT capsule, Take 400 Units by mouth daily., Disp: , Rfl:  .  amoxicillin (AMOXIL) 875 MG tablet, Take 1 tablet (875 mg total) by mouth 2 (two) times daily., Disp: 20 tablet, Rfl: 0 .  fluticasone (FLOVENT HFA) 110 MCG/ACT inhaler, Inhale 1 puff into the lungs 2 (two) times daily., Disp: 1 Inhaler, Rfl: 12  No Known Allergies Review of Systems   Pertinent items are noted in the HPI. Otherwise, a complete ROS is negative.  Vitals   Vitals:   12/08/18 1129  BP: 140/78  Pulse: 85  Temp: 98.6 F (37 C)  TempSrc: Oral  SpO2: 96%  Weight: 160 lb (72.6 kg)  Height: 5' 4"  (1.626 m)     Body mass index is 27.46 kg/m.  Physical Exam   Physical Exam Vitals  signs and nursing note reviewed.  HENT:     Head: Normocephalic and atraumatic.     Ears:     Comments: Hearing aids bilaterally.     Nose:     Right Sinus: Maxillary sinus tenderness present.     Left Sinus: Maxillary sinus tenderness present.  Eyes:     Pupils: Pupils are equal, round, and reactive to light.  Neck:     Musculoskeletal: Normal range of motion and neck supple.  Cardiovascular:     Rate and Rhythm: Normal rate and regular rhythm.     Heart sounds: Normal heart sounds.  Pulmonary:     Effort: Pulmonary effort is normal.  Abdominal:     Palpations: Abdomen is soft.  Skin:    General: Skin is warm.  Psychiatric:        Behavior: Behavior normal.     Results for orders placed or performed in visit on 12/08/18  CBC with Differential/Platelet  Result Value Ref Range   WBC 3.9 (L) 4.0 - 10.5 K/uL   RBC 3.93 3.87 - 5.11 Mil/uL   Hemoglobin 12.1 12.0 - 15.0 g/dL   HCT 35.2 (L) 36.0 - 46.0 %   MCV 89.5 78.0 - 100.0 fl   MCHC 34.3 30.0 - 36.0 g/dL   RDW 14.1 11.5 - 15.5 %   Platelets 232.0 150.0 - 400.0 K/uL   Neutrophils Relative % 54.8 43.0 - 77.0 %   Lymphocytes Relative 32.3 12.0 - 46.0 %   Monocytes Relative 10.6 3.0 - 12.0 %   Eosinophils Relative 1.5 0.0 - 5.0 %   Basophils Relative 0.8 0.0 - 3.0 %   Neutro Abs 2.1 1.4 - 7.7 K/uL   Lymphs Abs 1.2 0.7 - 4.0 K/uL   Monocytes Absolute 0.4 0.1 - 1.0 K/uL   Eosinophils Absolute 0.1 0.0 - 0.7 K/uL   Basophils Absolute 0.0 0.0 - 0.1 K/uL  Comprehensive metabolic panel  Result Value Ref Range   Sodium 138 135 - 145 mEq/L   Potassium 4.5 3.5 - 5.1 mEq/L   Chloride 102 96 - 112 mEq/L   CO2 28 19 - 32 mEq/L   Glucose, Bld 83 70 - 99 mg/dL   BUN 12 6 - 23 mg/dL   Creatinine, Ser 0.72 0.40 - 1.20 mg/dL   Total Bilirubin 0.4 0.2 - 1.2 mg/dL   Alkaline Phosphatase 111 39 - 117 U/L   AST 18 0 - 37 U/L   ALT 17 0 - 35 U/L   Total Protein 7.1 6.0 - 8.3 g/dL   Albumin 4.3 3.5 - 5.2 g/dL   Calcium 9.4 8.4 - 10.5  mg/dL   GFR 79.34 >  60.00 mL/min  Lipid panel  Result Value Ref Range   Cholesterol 166 0 - 200 mg/dL   Triglycerides 197.0 (H) 0.0 - 149.0 mg/dL   HDL 39.40 >39.00 mg/dL   VLDL 39.4 0.0 - 40.0 mg/dL   LDL Cholesterol 87 0 - 99 mg/dL   Total CHOL/HDL Ratio 4    NonHDL 126.47   VITAMIN D 25 Hydroxy (Vit-D Deficiency, Fractures)  Result Value Ref Range   VITD 13.98 (L) 30.00 - 100.00 ng/mL  Vitamin B12  Result Value Ref Range   Vitamin B-12 205 (L) 211 - 911 pg/mL  TSH  Result Value Ref Range   TSH 1.78 0.35 - 4.50 uIU/mL  T4, free  Result Value Ref Range   Free T4 0.70 0.60 - 1.60 ng/dL  Hepatitis C antibody  Result Value Ref Range   Hepatitis C Ab NON-REACTIVE NON-REACTI   SIGNAL TO CUT-OFF 0.15 <1.00    Assessment and Plan   Autumn Conley was seen today for establish care.  Diagnoses and all orders for this visit:  Elevated LFTs  Gastroesophageal reflux disease, esophagitis presence not specified  OSA (obstructive sleep apnea)  Cough due to bronchospasm -     CBC with Differential/Platelet  Encounter for hepatitis C virus screening test for high risk patient -     Hepatitis C antibody  Fatigue, unspecified type -     Vitamin B12 -     TSH -     T4, free  Vitamin D deficiency -     VITAMIN D 25 Hydroxy (Vit-D Deficiency, Fractures)  Pure hypercholesterolemia -     Comprehensive metabolic panel -     Lipid panel  B12 deficiency -     CBC with Differential/Platelet  Stress incontinence in female  Chronic maxillary sinusitis -     amoxicillin (AMOXIL) 875 MG tablet; Take 1 tablet (875 mg total) by mouth 2 (two) times daily. -     fluticasone (FLOVENT HFA) 110 MCG/ACT inhaler; Inhale 1 puff into the lungs 2 (two) times daily.  Need for diphtheria-tetanus-pertussis (Tdap) vaccine -     Tdap (BOOSTRIX) 5-2.5-18.5 LF-MCG/0.5 injection; Inject 0.5 mLs into the muscle once for 1 dose.    . Orders and follow up as documented in [place], reviewed diet, exercise  and weight control, cardiovascular risk and specific lipid/LDL goals reviewed, reviewed medications and side effects in detail.  . Reviewed expectations re: course of current medical issues. . Outlined signs and symptoms indicating need for more acute intervention. . Patient verbalized understanding and all questions were answered. . Patient received an After Visit Summary.  Briscoe Deutscher, DO [place], [facility] 12/19/2018

## 2018-12-09 LAB — HEPATITIS C ANTIBODY
Hepatitis C Ab: NONREACTIVE
SIGNAL TO CUT-OFF: 0.15 (ref <1.00)

## 2018-12-14 DIAGNOSIS — E785 Hyperlipidemia, unspecified: Secondary | ICD-10-CM | POA: Insufficient documentation

## 2018-12-14 DIAGNOSIS — Z8719 Personal history of other diseases of the digestive system: Secondary | ICD-10-CM | POA: Insufficient documentation

## 2018-12-16 ENCOUNTER — Other Ambulatory Visit: Payer: Self-pay

## 2018-12-16 ENCOUNTER — Encounter: Payer: Self-pay | Admitting: Family Medicine

## 2018-12-16 DIAGNOSIS — N644 Mastodynia: Secondary | ICD-10-CM | POA: Diagnosis not present

## 2018-12-16 DIAGNOSIS — Z853 Personal history of malignant neoplasm of breast: Secondary | ICD-10-CM | POA: Diagnosis not present

## 2018-12-16 LAB — HM MAMMOGRAPHY

## 2018-12-19 ENCOUNTER — Encounter: Payer: Self-pay | Admitting: Family Medicine

## 2018-12-19 DIAGNOSIS — E538 Deficiency of other specified B group vitamins: Secondary | ICD-10-CM | POA: Insufficient documentation

## 2018-12-19 DIAGNOSIS — N393 Stress incontinence (female) (male): Secondary | ICD-10-CM | POA: Insufficient documentation

## 2018-12-20 ENCOUNTER — Other Ambulatory Visit: Payer: Self-pay

## 2018-12-20 MED ORDER — CHOLECALCIFEROL 1.25 MG (50000 UT) PO TABS: ORAL_TABLET | ORAL | 0 refills | Status: DC

## 2018-12-21 ENCOUNTER — Ambulatory Visit: Payer: Self-pay

## 2018-12-21 ENCOUNTER — Encounter: Payer: Self-pay | Admitting: Family Medicine

## 2018-12-21 NOTE — Telephone Encounter (Signed)
Incoming  Call from Patient with  Complaint of a history of  Bronchitis  and  SOB.  Bronchitis onset   was Sunday.  Comes and  Goes.  Severity  Is  Moderate.    Denies cardiac and  Lung  History.  Patient   States she  Has an  Appointment  tomorrow for  B12 injection.  Would like  To  Receive an inhaler  To  Help  With  Breathing.  Patient  Reports  Chest pain of  6. Pain radiates  to  the  back Patient  States the  She  Has  Already  Had  2  Round  Of  Antibiotics.  Reviewed the protocol with  Patient which  recommended that  Patient go to ED for further evaluation. Patient was also tha  She would wait until  Tomorrow appointment.    Reason for Disposition . [1] MODERATE difficulty breathing (e.g., speaks in phrases, SOB even at rest, pulse 100-120) AND [2] NEW-onset or WORSE than normal  Answer Assessment - Initial Assessment Questions 1. RESPIRATORY STATUS: "Describe your breathing?" (e.g., wheezing, shortness of breath, unable to speak, severe coughing)      sob 2. ONSET: "When did this breathing problem begin?"       Sunday 3. PATTERN "Does the difficult breathing come and go, or has it been constant since it started?"      Comes and  go 4. SEVERITY: "How bad is your breathing?" (e.g., mild, moderate, severe)    - MILD: No SOB at rest, mild SOB with walking, speaks normally in sentences, can lay down, no retractions, pulse < 100.    - MODERATE: SOB at rest, SOB with minimal exertion and prefers to sit, cannot lie down flat, speaks in phrases, mild retractions, audible wheezing, pulse 100-120.    - SEVERE: Very SOB at rest, speaks in single words, struggling to breathe, sitting hunched forward, retractions, pulse > 120      moderate 5. RECURRENT SYMPTOM: "Have you had difficulty breathing before?" If so, ask: "When was the last time?" and "What happened that time?"      *No Answer* 6. CARDIAC HISTORY: "Do you have any history of heart disease?" (e.g., heart attack, angina, bypass surgery,  angioplasty)      denies 7. LUNG HISTORY: "Do you have any history of lung disease?"  (e.g., pulmonary embolus, asthma, emphysema)    denies 8. CAUSE: "What do you think is causing the breathing problem?"      *No Answer* 9. OTHER SYMPTOMS: "Do you have any other symptoms? (e.g., dizziness, runny nose, cough, chest pain, fever)     nodizziness 10. PREGNANCY: "Is there any chance you are pregnant?" "When was your last menstrual period?"       na 11. TRAVEL: "Have you traveled out of the country in the last month?" (e.g., travel history, exposures)       Na  Protocols used: BREATHING DIFFICULTY-A-AH

## 2018-12-22 ENCOUNTER — Encounter: Payer: Self-pay | Admitting: Physician Assistant

## 2018-12-22 ENCOUNTER — Ambulatory Visit (INDEPENDENT_AMBULATORY_CARE_PROVIDER_SITE_OTHER): Payer: Medicare Other | Admitting: Physician Assistant

## 2018-12-22 ENCOUNTER — Ambulatory Visit (INDEPENDENT_AMBULATORY_CARE_PROVIDER_SITE_OTHER): Payer: Medicare Other

## 2018-12-22 ENCOUNTER — Ambulatory Visit: Payer: Medicare Other

## 2018-12-22 VITALS — BP 130/80 | HR 80 | Temp 97.8°F | Ht 64.0 in | Wt 164.5 lb

## 2018-12-22 DIAGNOSIS — J181 Lobar pneumonia, unspecified organism: Secondary | ICD-10-CM | POA: Diagnosis not present

## 2018-12-22 DIAGNOSIS — R059 Cough, unspecified: Secondary | ICD-10-CM

## 2018-12-22 DIAGNOSIS — R05 Cough: Secondary | ICD-10-CM

## 2018-12-22 MED ORDER — LEVOFLOXACIN 500 MG PO TABS: 500.0000 mg | ORAL_TABLET | Freq: Every day | ORAL | 0 refills | Status: DC

## 2018-12-22 MED ORDER — PREDNISONE 5 MG PO TABS: ORAL_TABLET | ORAL | 0 refills | Status: DC

## 2018-12-22 MED ORDER — ALBUTEROL SULFATE HFA 108 (90 BASE) MCG/ACT IN AERS: 2.0000 | INHALATION_SPRAY | Freq: Four times a day (QID) | RESPIRATORY_TRACT | 2 refills | Status: DC | PRN

## 2018-12-22 MED ORDER — IPRATROPIUM-ALBUTEROL 0.5-2.5 (3) MG/3ML IN SOLN: 3.0000 mL | Freq: Four times a day (QID) | RESPIRATORY_TRACT | Status: DC

## 2018-12-22 MED ORDER — IPRATROPIUM-ALBUTEROL 0.5-2.5 (3) MG/3ML IN SOLN
3.0000 mL | Freq: Once | RESPIRATORY_TRACT | Status: AC
  Administered 2018-12-22: 3 mL via RESPIRATORY_TRACT

## 2018-12-22 NOTE — Patient Instructions (Signed)
It was great to see you!  Start oral antibiotic, levaquin. Start oral prednisone. Start albuterol inhaler as needed.  Push fluids and get plenty of rest. Please return if you are not improving as expected, or if you have high fevers (>101.5) or difficulty swallowing or worsening productive cough.  Call clinic with questions.  Follow-up with [place] in ONE WEEK --> SOONER IF NEEDED  I hope you start feeling better soon!

## 2018-12-22 NOTE — Progress Notes (Signed)
Autumn Conley is a 73 y.o. female here for a follow up of a pre-existing problem.  History of Present Illness:   Chief Complaint  Patient presents with  . Chest Pain    x3 days    HPI   Patient is complaining of cough. She had azithromycin about 6-8 weeks ago and then amoxillin. Patient was given Flovent by PCP -- she is using this. She does not have an albuterol inhaler. Has not had fever. She is having R-sided chest pain, nasal congestion and intermittent sore throat.  Past Medical History:  Diagnosis Date  . Anxiety   . Arthritis    "joints" (11/29/2014)  . Autoimmune disorder ([place])    "non-specific"  . Cancer of left breast ([place])   . Depression   . Fibromyalgia    "some; not chronic" (11/29/2014)  . GERD (gastroesophageal reflux disease)   . Glaucoma of both eyes   . Headache    "weekly" (11/29/2014)  . Hypercholesteremia   . Hypertension   . IBS (irritable bowel syndrome)   . Mitral valve prolapse   . NASH (nonalcoholic steatohepatitis)   . Osteoarthritis   . PONV (postoperative nausea and vomiting)      Social History   Socioeconomic History  . Marital status: Married    Spouse name: Not on file  . Number of children: 1  . Years of education: 22  . Highest education level: Not on file  Occupational History  . Occupation: Retired  Scientific laboratory technician  . Financial resource strain: Not on file  . Food insecurity:    Worry: Not on file    Inability: Not on file  . Transportation needs:    Medical: Not on file    Non-medical: Not on file  Tobacco Use  . Smoking status: Never Smoker  . Smokeless tobacco: Never Used  Substance and Sexual Activity  . Alcohol use: No  . Drug use: No  . Sexual activity: Never  Lifestyle  . Physical activity:    Days per week: Not on file    Minutes per session: Not on file  . Stress: Not on file  Relationships  . Social connections:    Talks on phone: Not on file    Gets together: Not on file    Attends religious service: Not  on file    Active member of club or organization: Not on file    Attends meetings of clubs or organizations: Not on file    Relationship status: Not on file  . Intimate partner violence:    Fear of current or ex partner: Not on file    Emotionally abused: Not on file    Physically abused: Not on file    Forced sexual activity: Not on file  Other Topics Concern  . Not on file  Social History Narrative   Lives at home with her husband.   Right-handed.   2 cups caffeine/day.    Past Surgical History:  Procedure Laterality Date  . ABDOMINAL HYSTERECTOMY  1980  . APPENDECTOMY  1953  . BREAST BIOPSY Left   . BREAST LUMPECTOMY Left   . CATARACT EXTRACTION, BILATERAL  2018  . MASTECTOMY Left ~ 2009    Family History  Problem Relation Age of Onset  . Lung cancer Mother 28  . Prostate cancer Brother 60    No Known Allergies  Current Medications:   Current Outpatient Medications:  .  aspirin EC 81 MG tablet, Take 81 mg by mouth daily., Disp: ,  Rfl:  .  atorvastatin (LIPITOR) 40 MG tablet, Take 1 tablet by mouth at bedtime., Disp: , Rfl:  .  Cholecalciferol 1.25 MG (50000 UT) TABS, 50,000 units PO qwk for 12 weeks., Disp: 12 tablet, Rfl: 0 .  fluticasone (FLOVENT HFA) 110 MCG/ACT inhaler, Inhale 1 puff into the lungs 2 (two) times daily., Disp: 1 Inhaler, Rfl: 12 .  gabapentin (NEURONTIN) 300 MG capsule, Take 1 capsule by mouth daily., Disp: , Rfl:  .  irbesartan (AVAPRO) 150 MG tablet, Take 1 tablet by mouth daily., Disp: , Rfl:  .  PARoxetine (PAXIL) 20 MG tablet, Take 60 mg by mouth daily. , Disp: , Rfl:  .  Sennosides-Docusate Sodium (PERI-COLACE PO), Take by mouth as needed., Disp: , Rfl:  .  SUMAtriptan (IMITREX) 100 MG tablet, Take 1 tablet by mouth 2 (two) times daily as needed. At least 2 hrs between doses as needed bid, Disp: , Rfl:  .  vitamin E 100 UNIT capsule, Take 400 Units by mouth daily., Disp: , Rfl:  .  albuterol (PROVENTIL HFA;VENTOLIN HFA) 108 (90 Base)  MCG/ACT inhaler, Inhale 2 puffs into the lungs every 6 (six) hours as needed for wheezing or shortness of breath., Disp: 1 Inhaler, Rfl: 2 .  levofloxacin (LEVAQUIN) 500 MG tablet, Take 1 tablet (500 mg total) by mouth daily., Disp: 7 tablet, Rfl: 0 .  predniSONE (DELTASONE) 5 MG tablet, 6-5-4-3-2-1-off, Disp: 21 tablet, Rfl: 0   Review of Systems:   Review of Systems  Constitutional: Negative for chills, fever, malaise/fatigue and weight loss.  Respiratory: Positive for cough. Negative for shortness of breath.   Cardiovascular: Positive for chest pain. Negative for orthopnea, claudication and leg swelling.  Gastrointestinal: Negative for heartburn, nausea and vomiting.  Neurological: Negative for dizziness, tingling and headaches.    Vitals:   Vitals:   12/22/18 1453  BP: 130/80  Pulse: 80  Temp: 97.8 F (36.6 C)  TempSrc: Oral  SpO2: 95%  Weight: 164 lb 8 oz (74.6 kg)  Height: 5' 4"  (1.626 m)     Body mass index is 28.24 kg/m.  Physical Exam:   Physical Exam Vitals signs and nursing note reviewed.  Constitutional:      General: She is not in acute distress.    Appearance: She is well-developed. She is not ill-appearing or toxic-appearing.  HENT:     Head: Normocephalic and atraumatic.     Right Ear: Tympanic membrane, ear canal and external ear normal. Tympanic membrane is not erythematous, retracted or bulging.     Left Ear: Tympanic membrane, ear canal and external ear normal. Tympanic membrane is not erythematous, retracted or bulging.     Nose: Nose normal.     Right Sinus: No maxillary sinus tenderness or frontal sinus tenderness.     Left Sinus: No maxillary sinus tenderness or frontal sinus tenderness.     Mouth/Throat:     Pharynx: Uvula midline. No posterior oropharyngeal erythema.  Eyes:     General: Lids are normal.     Conjunctiva/sclera: Conjunctivae normal.  Neck:     Trachea: Trachea normal.  Cardiovascular:     Rate and Rhythm: Normal rate and  regular rhythm.     Heart sounds: Normal heart sounds, S1 normal and S2 normal.  Pulmonary:     Effort: Pulmonary effort is normal. No tachypnea, accessory muscle usage, prolonged expiration or respiratory distress.     Breath sounds: Examination of the right-upper field reveals decreased breath sounds. Examination of the left-upper  field reveals decreased breath sounds. Examination of the right-middle field reveals decreased breath sounds. Examination of the left-middle field reveals decreased breath sounds. Examination of the right-lower field reveals decreased breath sounds. Examination of the left-lower field reveals decreased breath sounds. Decreased breath sounds present. No wheezing, rhonchi or rales.  Lymphadenopathy:     Cervical: No cervical adenopathy.  Skin:    General: Skin is warm and dry.  Neurological:     Mental Status: She is alert.  Psychiatric:        Speech: Speech normal.        Behavior: Behavior normal. Behavior is cooperative.       Assessment and Plan:   Brandii was seen today for chest pain.  Diagnoses and all orders for this visit:  Cough No red flags on exam.  Chest xray concerning for PNA, awaiting official read. Start levaquin, prednisone. Continue flovent. Albuterol prn. Discussed taking medications as prescribed. Reviewed return precautions including worsening fever, SOB, worsening cough or other concerns. Push fluids and rest. Follow-up in 1 week, sooner if symptoms. -     DG Chest 2 View; Future -     Discontinue: ipratropium-albuterol (DUONEB) 0.5-2.5 (3) MG/3ML nebulizer solution 3 mL -     ipratropium-albuterol (DUONEB) 0.5-2.5 (3) MG/3ML nebulizer solution 3 mL  Other orders -     predniSONE (DELTASONE) 5 MG tablet; 6-5-4-3-2-1-off -     levofloxacin (LEVAQUIN) 500 MG tablet; Take 1 tablet (500 mg total) by mouth daily. -     albuterol (PROVENTIL HFA;VENTOLIN HFA) 108 (90 Base) MCG/ACT inhaler; Inhale 2 puffs into the lungs every 6 (six) hours as  needed for wheezing or shortness of breath.  . Reviewed expectations re: course of current medical issues. . Discussed self-management of symptoms. . Outlined signs and symptoms indicating need for more acute intervention. . Patient verbalized understanding and all questions were answered. . See orders for this visit as documented in the electronic medical record. . Patient received an After-Visit Summary.  [name], PA-C

## 2018-12-23 ENCOUNTER — Telehealth: Payer: Self-pay | Admitting: *Deleted

## 2018-12-23 NOTE — Telephone Encounter (Signed)
[place]: Chest X-ray call report: New dense R middle lobe consolidation presumably pneumonia. Small R pleural effusion.  Image report is in EPIC

## 2018-12-23 NOTE — Telephone Encounter (Signed)
fYI

## 2018-12-23 NOTE — Telephone Encounter (Signed)
Patient aware of results -- were discussed during visit yesterday with PCP reviewing xray.

## 2018-12-23 NOTE — Telephone Encounter (Signed)
See note

## 2018-12-29 ENCOUNTER — Other Ambulatory Visit: Payer: Self-pay

## 2018-12-29 ENCOUNTER — Ambulatory Visit (INDEPENDENT_AMBULATORY_CARE_PROVIDER_SITE_OTHER): Payer: Medicare Other | Admitting: Physician Assistant

## 2018-12-29 ENCOUNTER — Encounter: Payer: Self-pay | Admitting: Physician Assistant

## 2018-12-29 ENCOUNTER — Ambulatory Visit (HOSPITAL_COMMUNITY)
Admission: RE | Admit: 2018-12-29 | Discharge: 2018-12-29 | Disposition: A | Discharge status: Home / Self Care | Payer: Medicare Other | Source: Ambulatory Visit | Attending: Physician Assistant | Admitting: Physician Assistant

## 2018-12-29 VITALS — BP 140/88 | HR 93 | Temp 99.0°F | Ht 64.0 in | Wt 159.4 lb

## 2018-12-29 DIAGNOSIS — J189 Pneumonia, unspecified organism: Secondary | ICD-10-CM

## 2018-12-29 DIAGNOSIS — J181 Lobar pneumonia, unspecified organism: Secondary | ICD-10-CM

## 2018-12-29 DIAGNOSIS — R918 Other nonspecific abnormal finding of lung field: Secondary | ICD-10-CM

## 2018-12-29 DIAGNOSIS — J479 Bronchiectasis, uncomplicated: Secondary | ICD-10-CM | POA: Diagnosis not present

## 2018-12-29 NOTE — Progress Notes (Signed)
Autumn Conley is a 73 y.o. female here for a new problem.  History of Present Illness:   Chief Complaint  Patient presents with  . Follow-up    HPI   Patient is here for follow-up on cough. During her last visit CXR was performed on 3//4/20 that showed:  IMPRESSION: New dense right middle lobe consolidation, presumably pneumonia. New small right pleural effusion. Short-term follow-up post treatment chest radiographs advised to document clearance.  She was prescribed levaquin and prednisone, as well as albuterol inhaler. She states that she is using all medications as prescribed.  She denies: fevers, SOB, sore throat  She is having R lower rib pain that she didn't have during last visit. She also tells me that during her last mammogram it was found that she had fluid under her right nipple and she was supposed to have it aspirated, however she had to cancel the appt due to her PNA.    Past Medical History:  Diagnosis Date  . Anxiety   . Arthritis    "joints" (11/29/2014)  . Autoimmune disorder ([place])    "non-specific"  . Cancer of left breast ([place])   . Depression   . Fibromyalgia    "some; not chronic" (11/29/2014)  . GERD (gastroesophageal reflux disease)   . Glaucoma of both eyes   . Headache    "weekly" (11/29/2014)  . Hypercholesteremia   . Hypertension   . IBS (irritable bowel syndrome)   . Mitral valve prolapse   . NASH (nonalcoholic steatohepatitis)   . Osteoarthritis   . PONV (postoperative nausea and vomiting)      Social History   Socioeconomic History  . Marital status: Married    Spouse name: Not on file  . Number of children: 1  . Years of education: 25  . Highest education level: Not on file  Occupational History  . Occupation: Retired  Scientific laboratory technician  . Financial resource strain: Not on file  . Food insecurity:    Worry: Not on file    Inability: Not on file  . Transportation needs:    Medical: Not on file    Non-medical: Not on file   Tobacco Use  . Smoking status: Never Smoker  . Smokeless tobacco: Never Used  Substance and Sexual Activity  . Alcohol use: No  . Drug use: No  . Sexual activity: Never  Lifestyle  . Physical activity:    Days per week: Not on file    Minutes per session: Not on file  . Stress: Not on file  Relationships  . Social connections:    Talks on phone: Not on file    Gets together: Not on file    Attends religious service: Not on file    Active member of club or organization: Not on file    Attends meetings of clubs or organizations: Not on file    Relationship status: Not on file  . Intimate partner violence:    Fear of current or ex partner: Not on file    Emotionally abused: Not on file    Physically abused: Not on file    Forced sexual activity: Not on file  Other Topics Concern  . Not on file  Social History Narrative   Lives at home with her husband.   Right-handed.   2 cups caffeine/day.    Past Surgical History:  Procedure Laterality Date  . ABDOMINAL HYSTERECTOMY  1980  . APPENDECTOMY  1953  . BREAST BIOPSY Left   .  BREAST LUMPECTOMY Left   . CATARACT EXTRACTION, BILATERAL  2018  . MASTECTOMY Left ~ 2009    Family History  Problem Relation Age of Onset  . Lung cancer Mother 51  . Prostate cancer Brother 60    No Known Allergies  Current Medications:   Current Outpatient Medications:  .  albuterol (PROVENTIL HFA;VENTOLIN HFA) 108 (90 Base) MCG/ACT inhaler, Inhale 2 puffs into the lungs every 6 (six) hours as needed for wheezing or shortness of breath., Disp: 1 Inhaler, Rfl: 2 .  aspirin EC 81 MG tablet, Take 81 mg by mouth daily., Disp: , Rfl:  .  Cholecalciferol 1.25 MG (50000 UT) TABS, 50,000 units PO qwk for 12 weeks., Disp: 12 tablet, Rfl: 0 .  fluticasone (FLOVENT HFA) 110 MCG/ACT inhaler, Inhale 1 puff into the lungs 2 (two) times daily., Disp: 1 Inhaler, Rfl: 12 .  gabapentin (NEURONTIN) 300 MG capsule, Take 1 capsule by mouth daily., Disp: , Rfl:   .  irbesartan (AVAPRO) 150 MG tablet, Take 1 tablet by mouth daily., Disp: , Rfl:  .  PARoxetine (PAXIL) 20 MG tablet, Take 60 mg by mouth daily. , Disp: , Rfl:  .  predniSONE (DELTASONE) 5 MG tablet, 6-5-4-3-2-1-off, Disp: 21 tablet, Rfl: 0 .  Sennosides-Docusate Sodium (PERI-COLACE PO), Take by mouth as needed., Disp: , Rfl:  .  SUMAtriptan (IMITREX) 100 MG tablet, Take 1 tablet by mouth 2 (two) times daily as needed. At least 2 hrs between doses as needed bid, Disp: , Rfl:  .  vitamin E 100 UNIT capsule, Take 400 Units by mouth daily., Disp: , Rfl:  .  atorvastatin (LIPITOR) 40 MG tablet, Take 1 tablet by mouth at bedtime., Disp: , Rfl:    Review of Systems:   Review of Systems  Constitutional: Negative for chills, fever, malaise/fatigue and weight loss.  Respiratory: Positive for cough. Negative for shortness of breath.   Cardiovascular: Negative for chest pain, orthopnea, claudication and leg swelling.  Gastrointestinal: Negative for heartburn, nausea and vomiting.  Neurological: Negative for dizziness, tingling and headaches.    Vitals:   Vitals:   12/29/18 1024  BP: 140/88  Pulse: 93  Temp: 99 F (37.2 C)  TempSrc: Oral  SpO2: 93%  Weight: 159 lb 6.1 oz (72.3 kg)  Height: 5' 4"  (1.626 m)     Body mass index is 27.36 kg/m.  Physical Exam:   Physical Exam Vitals signs and nursing note reviewed.  Constitutional:      General: She is not in acute distress.    Appearance: She is well-developed. She is not ill-appearing or toxic-appearing.  HENT:     Head: Normocephalic and atraumatic.     Right Ear: Tympanic membrane, ear canal and external ear normal. Tympanic membrane is not erythematous, retracted or bulging.     Left Ear: Tympanic membrane, ear canal and external ear normal. Tympanic membrane is not erythematous, retracted or bulging.     Nose: Nose normal.     Right Sinus: No maxillary sinus tenderness or frontal sinus tenderness.     Left Sinus: No maxillary  sinus tenderness or frontal sinus tenderness.     Mouth/Throat:     Pharynx: Uvula midline. No posterior oropharyngeal erythema.  Eyes:     General: Lids are normal.     Conjunctiva/sclera: Conjunctivae normal.  Neck:     Trachea: Trachea normal.  Cardiovascular:     Rate and Rhythm: Normal rate and regular rhythm.     Heart sounds: Normal  heart sounds, S1 normal and S2 normal.  Pulmonary:     Effort: Pulmonary effort is normal.     Breath sounds: Normal breath sounds. No decreased breath sounds, wheezing, rhonchi or rales.  Lymphadenopathy:     Cervical: No cervical adenopathy.  Skin:    General: Skin is warm and dry.  Neurological:     Mental Status: She is alert.  Psychiatric:        Speech: Speech normal.        Behavior: Behavior normal. Behavior is cooperative.      Assessment and Plan:   Shealyn was seen today for follow-up.  Diagnoses and all orders for this visit:  Pneumonia of right middle lobe due to infectious organism [facility]) -     CT Chest Wo Contrast; Future    Given new symptoms of rib pain and concerning CXR at prior visit, as well has hx of breast cancer and recent fluid collection under right breast -- will obtain stat CT of chest for further work-up.  . Reviewed expectations re: course of current medical issues. . Discussed self-management of symptoms. . Outlined signs and symptoms indicating need for more acute intervention. . Patient verbalized understanding and all questions were answered. . See orders for this visit as documented in the electronic medical record. . Patient received an After-Visit Summary.   [name], PA-C

## 2018-12-29 NOTE — Patient Instructions (Signed)
It was great to see you!  Please have the CT scan performed as scheduled. We will call you with results and any further recommendations.  Take care,  [name] PA-C

## 2018-12-29 NOTE — Addendum Note (Signed)
Addended by: Erlene Quan on: 12/29/2018 02:51 PM   Modules accepted: Orders

## 2018-12-30 ENCOUNTER — Telehealth: Payer: Self-pay | Admitting: Internal Medicine

## 2018-12-30 ENCOUNTER — Other Ambulatory Visit: Payer: Self-pay

## 2018-12-30 ENCOUNTER — Ambulatory Visit: Payer: Self-pay

## 2018-12-30 DIAGNOSIS — B370 Candidal stomatitis: Secondary | ICD-10-CM

## 2018-12-30 MED ORDER — NYSTATIN 100000 UNIT/ML MT SUSP: 5.0000 mL | Freq: Four times a day (QID) | OROMUCOSAL | 0 refills | Status: DC

## 2018-12-30 NOTE — Telephone Encounter (Signed)
error 

## 2018-12-30 NOTE — Telephone Encounter (Signed)
pls see message and advise

## 2018-12-30 NOTE — Telephone Encounter (Signed)
Phone call returned to pt.  Reported she has been on antibiotic and steroid recently, and has developed small clear fluid-filled blisters in mouth.  Denied any open sores in mouth.  Stated there is a tan coating at the back of her tongue.  Denied any difficulty swallowing or with breathing.  Denied fever.  Is requesting a prescription mouthwash.  Advised will send message to her provider with request.  Care advice given per protocol.  Encouraged to call back to office, if has not rec'd call from nurse by 3/13 @ 2:00 PM.   Verb. Understanding.    Reason for Disposition . White patches that stick to tongue or inner cheek    Seen in office 3/11; forgot to report internal mouth blisters and tan coating on back of tongue.  Recently on antibiotic and Prednisone.  Answer Assessment - Initial Assessment Questions 1. SYMPTOM: "What's the main symptom you're concerned about?" (e.g., dry mouth. chapped lips, lump)     Blisters in the mouth 2. ONSET: "When did the  symptoms  start?"     Monday or Tuesday  3. PAIN: "Is there any pain?" If so, ask: "How bad is it?" (Scale: 1-10; mild, moderate, severe)     Uncomfortable 4. CAUSE: "What do you think is causing the symptoms?"     Thinks it has been caused by being on antibiotic; last dose on Monday, 39 5. OTHER SYMPTOMS: "Do you have any other symptoms?" (e.g., fever, sore throat, toothache, swelling)     Mouth blisters -2-3 blisters  6. PREGNANCY: "Is there any chance you are pregnant?" "When was your last menstrual period?"     N/a  Protocols used: MOU[facility]

## 2018-12-30 NOTE — Telephone Encounter (Signed)
See note

## 2018-12-30 NOTE — Telephone Encounter (Signed)
Okay to send in nystatin swish and spit.

## 2018-12-30 NOTE — Telephone Encounter (Signed)
Called patient let her know that meds have been called in. She also has received one call about making app with oncology and they told her they will call back to make app at [facility]. If she does not hear by lunch tomorrow she will call so that I can f/u for her.

## 2018-12-31 ENCOUNTER — Telehealth: Payer: Self-pay | Admitting: *Deleted

## 2018-12-31 DIAGNOSIS — R918 Other nonspecific abnormal finding of lung field: Secondary | ICD-10-CM

## 2018-12-31 NOTE — Telephone Encounter (Signed)
Oncology Nurse Navigator Documentation  Oncology Nurse Navigator Flowsheets 12/31/2018  Navigator Location [facility]  Referral date to RadOnc/MedOnc 12/30/2018  Navigator Encounter Type Telephone/I received referral yesterday on [name].  I updated Dr. Julien Nordmann.  I called and scheduled her to be seen on 01/07/2019.  She verbalized understanding of appt time and place.   Telephone Outgoing Call  Treatment Phase Abnormal Scans  Barriers/Navigation Needs Education;Coordination of Care  Education Other  Interventions Coordination of Care;Education  Coordination of Care Appts  Education Method Verbal  Acuity Level 2  Time Spent with Patient 30

## 2019-01-06 ENCOUNTER — Telehealth: Payer: Self-pay | Admitting: Medical Oncology

## 2019-01-06 NOTE — Telephone Encounter (Signed)
Unable to leave message re Travel screening.

## 2019-01-07 ENCOUNTER — Inpatient Hospital Stay (HOSPITAL_BASED_OUTPATIENT_CLINIC_OR_DEPARTMENT_OTHER): Payer: Medicare Other | Admitting: Internal Medicine

## 2019-01-07 ENCOUNTER — Inpatient Hospital Stay: Payer: Medicare Other | Attending: Internal Medicine

## 2019-01-07 ENCOUNTER — Other Ambulatory Visit: Payer: Self-pay

## 2019-01-07 ENCOUNTER — Telehealth: Payer: Self-pay | Admitting: Internal Medicine

## 2019-01-07 ENCOUNTER — Encounter: Payer: Self-pay | Admitting: Internal Medicine

## 2019-01-07 VITALS — BP 146/83 | HR 88 | Temp 98.0°F | Resp 18 | Ht 64.0 in | Wt 156.5 lb

## 2019-01-07 DIAGNOSIS — Z9012 Acquired absence of left breast and nipple: Secondary | ICD-10-CM

## 2019-01-07 DIAGNOSIS — R911 Solitary pulmonary nodule: Secondary | ICD-10-CM

## 2019-01-07 DIAGNOSIS — R918 Other nonspecific abnormal finding of lung field: Secondary | ICD-10-CM | POA: Diagnosis not present

## 2019-01-07 DIAGNOSIS — Z79899 Other long term (current) drug therapy: Secondary | ICD-10-CM | POA: Diagnosis not present

## 2019-01-07 DIAGNOSIS — H409 Unspecified glaucoma: Secondary | ICD-10-CM | POA: Diagnosis not present

## 2019-01-07 DIAGNOSIS — Z801 Family history of malignant neoplasm of trachea, bronchus and lung: Secondary | ICD-10-CM | POA: Diagnosis not present

## 2019-01-07 DIAGNOSIS — I341 Nonrheumatic mitral (valve) prolapse: Secondary | ICD-10-CM | POA: Insufficient documentation

## 2019-01-07 DIAGNOSIS — M797 Fibromyalgia: Secondary | ICD-10-CM | POA: Diagnosis not present

## 2019-01-07 DIAGNOSIS — Z853 Personal history of malignant neoplasm of breast: Secondary | ICD-10-CM | POA: Diagnosis not present

## 2019-01-07 DIAGNOSIS — I10 Essential (primary) hypertension: Secondary | ICD-10-CM | POA: Insufficient documentation

## 2019-01-07 DIAGNOSIS — M1990 Unspecified osteoarthritis, unspecified site: Secondary | ICD-10-CM

## 2019-01-07 DIAGNOSIS — Z8042 Family history of malignant neoplasm of prostate: Secondary | ICD-10-CM | POA: Diagnosis not present

## 2019-01-07 DIAGNOSIS — Z7982 Long term (current) use of aspirin: Secondary | ICD-10-CM | POA: Insufficient documentation

## 2019-01-07 DIAGNOSIS — Z90710 Acquired absence of both cervix and uterus: Secondary | ICD-10-CM

## 2019-01-07 DIAGNOSIS — C3490 Malignant neoplasm of unspecified part of unspecified bronchus or lung: Secondary | ICD-10-CM

## 2019-01-07 DIAGNOSIS — F329 Major depressive disorder, single episode, unspecified: Secondary | ICD-10-CM

## 2019-01-07 DIAGNOSIS — F419 Anxiety disorder, unspecified: Secondary | ICD-10-CM | POA: Diagnosis not present

## 2019-01-07 LAB — CMP (CANCER CENTER ONLY)
ALT: 15 U/L (ref 0–44)
AST: 15 U/L (ref 15–41)
Albumin: 3.7 g/dL (ref 3.5–5.0)
Alkaline Phosphatase: 131 U/L — ABNORMAL HIGH (ref 38–126)
Anion gap: 10 (ref 5–15)
BUN: 10 mg/dL (ref 8–23)
CO2: 26 mmol/L (ref 22–32)
Calcium: 9.7 mg/dL (ref 8.9–10.3)
Chloride: 103 mmol/L (ref 98–111)
Creatinine: 0.78 mg/dL (ref 0.44–1.00)
GFR, Est AFR Am: >60 mL/min (ref >60)
GFR, Estimated: >60 mL/min (ref >60)
Glucose, Bld: 101 mg/dL — ABNORMAL HIGH (ref 70–99)
Potassium: 4.6 mmol/L (ref 3.5–5.1)
Sodium: 139 mmol/L (ref 135–145)
Total Bilirubin: 0.3 mg/dL (ref 0.3–1.2)
Total Protein: 7.9 g/dL (ref 6.5–8.1)

## 2019-01-07 LAB — CBC WITH DIFFERENTIAL (CANCER CENTER ONLY)
Abs Immature Granulocytes: 0.01 10*3/uL (ref 0.00–0.07)
Basophils Absolute: 0.1 10*3/uL (ref 0.0–0.1)
Basophils Relative: 1 %
Eosinophils Absolute: 0.1 10*3/uL (ref 0.0–0.5)
Eosinophils Relative: 2 %
HCT: 34.3 % — ABNORMAL LOW (ref 36.0–46.0)
HEMOGLOBIN: 11 g/dL — AB (ref 12.0–15.0)
Immature Granulocytes: 0 %
Lymphocytes Relative: 19 %
Lymphs Abs: 1.2 10*3/uL (ref 0.7–4.0)
MCH: 29.6 pg (ref 26.0–34.0)
MCHC: 32.1 g/dL (ref 30.0–36.0)
MCV: 92.2 fL (ref 80.0–100.0)
MONO ABS: 0.6 10*3/uL (ref 0.1–1.0)
Monocytes Relative: 9 %
Neutro Abs: 4.4 10*3/uL (ref 1.7–7.7)
Neutrophils Relative %: 69 %
Platelet Count: 360 10*3/uL (ref 150–400)
RBC: 3.72 MIL/uL — ABNORMAL LOW (ref 3.87–5.11)
RDW: 13 % (ref 11.5–15.5)
WBC: 6.4 10*3/uL (ref 4.0–10.5)
nRBC: 0 % (ref 0.0–0.2)

## 2019-01-07 NOTE — Progress Notes (Signed)
[place] [phone]   [phone]  CONSULT NOTE  REFERRING PHYSICIAN: Dr. Briscoe Deutscher  REASON FOR CONSULTATION:  73 years old white female with likely lung cancer.  HPI Autumn Conley is a 73 y.o. female never smoker with past medical history significant for hypertension, mitral valve prolapse, and anxiety, depression, fibromyalgia, glaucoma, osteoarthritis as well as history of left breast cancer in 2004 status post left mastectomy followed by 5 years treatment with hormonal therapy initially with tamoxifen switching to Arimidex.  This was done in [facility].  The patient mentioned that she has been doing fine except for cough and shortness of breath that started 2 months ago.  She was seen by her primary care physician and had 3 courses of antibiotics in addition to prednisone and inhalers with mild improvement of her condition.  She had a chest x-ray performed on December 19, 2018 and that showed new dense right middle lobe consolidation suspicious for pneumonia.  There was new small right pleural effusion.  This was followed by CT scan of the chest without contrast on December 29, 2018 and that showed a large masslike density within the anterior basal right upper lobe and anterior right middle lobe measuring 5.5 x 4.3 x 6.3 cm with extension into the right anterior chest wall and there was also medial extension of the mass into the mediastinum with suspected pericardial involvement.  The mass also extends around the anterior and right lateral aspect of the descending thoracic aorta.  The scan also showed 0.9 cm right supraclavicular lymph node, low right paratracheal lymph node measuring 1.0 cm.  The patient was referred to me today for evaluation and recommendation regarding her condition.  When seen today she continues to have tenderness around the right breast.  She has lack of energy as well as intermittent migraine headache but no blurry vision.   She lost around 10 pounds in the last few weeks.  She has shortness of breath with exertion as well as mild cough with no hemoptysis. Family history significant for mother with lung cancer, brother had prostate cancer, father has unknown medical history. The patient is married and has 1 daughter.  She was accompanied today by her husband Jeneen Rinks.  She is currently retired and used to work for [employer] for [employer] in [place].  She has no history for smoking, alcohol or drug abuse.  HPI  Past Medical History:  Diagnosis Date   Anxiety    Arthritis    "joints" (11/29/2014)   Autoimmune disorder ([place])    "non-specific"   Cancer of left breast ([place])    Depression    Fibromyalgia    "some; not chronic" (11/29/2014)   GERD (gastroesophageal reflux disease)    Glaucoma of both eyes    Headache    "weekly" (11/29/2014)   Hypercholesteremia    Hypertension    IBS (irritable bowel syndrome)    Mitral valve prolapse    NASH (nonalcoholic steatohepatitis)    Osteoarthritis    PONV (postoperative nausea and vomiting)     Past Surgical History:  Procedure Laterality Date   ABDOMINAL HYSTERECTOMY  1980   APPENDECTOMY  1953   BREAST BIOPSY Left    BREAST LUMPECTOMY Left    CATARACT EXTRACTION, BILATERAL  2018   MASTECTOMY Left ~ 2009    Family History  Problem Relation Age of Onset   Lung cancer Mother 49   Prostate cancer Brother 69    Social History  Social History   Tobacco Use   Smoking status: Never Smoker   Smokeless tobacco: Never Used  Substance Use Topics   Alcohol use: No   Drug use: No    No Known Allergies  Current Outpatient Medications  Medication Sig Dispense Refill   albuterol (PROVENTIL HFA;VENTOLIN HFA) 108 (90 Base) MCG/ACT inhaler Inhale 2 puffs into the lungs every 6 (six) hours as needed for wheezing or shortness of breath. 1 Inhaler 2   aspirin EC 81 MG tablet Take 81 mg by mouth daily.     atorvastatin (LIPITOR) 40 MG  tablet Take 1 tablet by mouth at bedtime.     Cholecalciferol 1.25 MG (50000 UT) TABS 50,000 units PO qwk for 12 weeks. 12 tablet 0   fluticasone (FLOVENT HFA) 110 MCG/ACT inhaler Inhale 1 puff into the lungs 2 (two) times daily. 1 Inhaler 12   gabapentin (NEURONTIN) 300 MG capsule Take 1 capsule by mouth daily.     irbesartan (AVAPRO) 150 MG tablet Take 1 tablet by mouth daily.     nystatin (MYCOSTATIN) 100000 UNIT/ML suspension Take 5 mLs (500,000 Units total) by mouth 4 (four) times daily. 60 mL 0   PARoxetine (PAXIL) 20 MG tablet Take 60 mg by mouth daily.      predniSONE (DELTASONE) 5 MG tablet 6-5-4-3-2-1-off 21 tablet 0   Sennosides-Docusate Sodium (PERI-COLACE PO) Take by mouth as needed.     SUMAtriptan (IMITREX) 100 MG tablet Take 1 tablet by mouth 2 (two) times daily as needed. At least 2 hrs between doses as needed bid     vitamin E 100 UNIT capsule Take 400 Units by mouth daily.     No current facility-administered medications for this visit.     Review of Systems  Constitutional: positive for fatigue and weight loss Eyes: negative Ears, nose, mouth, throat, and face: negative Respiratory: positive for dyspnea on exertion and pleurisy/chest pain Cardiovascular: negative Gastrointestinal: negative Genitourinary:negative Integument/breast: negative Hematologic/lymphatic: negative Musculoskeletal:negative Neurological: negative Behavioral/Psych: negative Endocrine: negative Allergic/Immunologic: negative  Physical Exam  GGY:IRSWN, healthy, no distress, well nourished, well developed and anxious SKIN: skin color, texture, turgor are normal, no rashes or significant lesions HEAD: Normocephalic, No masses, lesions, tenderness or abnormalities EYES: normal, PERRLA, Conjunctiva are pink and non-injected EARS: External ears normal, Canals clear OROPHARYNX:no exudate, no erythema and lips, buccal mucosa, and tongue normal  NECK: supple, no adenopathy, no  JVD LYMPH:  no palpable lymphadenopathy, no hepatosplenomegaly BREAST:not examined LUNGS: clear to auscultation , and palpation HEART: regular rate & rhythm, no murmurs and no gallops ABDOMEN:abdomen soft, non-tender, normal bowel sounds and no masses or organomegaly BACK: No CVA tenderness, Range of motion is normal EXTREMITIES:no joint deformities, effusion, or inflammation, no edema  NEURO: alert & oriented x 3 with fluent speech, no focal motor/sensory deficits  PERFORMANCE STATUS: ECOG 1  LABORATORY DATA: Lab Results  Component Value Date   WBC 6.4 01/07/2019   HGB 11.0 (L) 01/07/2019   HCT 34.3 (L) 01/07/2019   MCV 92.2 01/07/2019   PLT 360 01/07/2019      Chemistry      Component Value Date/Time   NA 139 01/07/2019 0913   NA 141 12/04/2017   K 4.6 01/07/2019 0913   CL 103 01/07/2019 0913   CO2 26 01/07/2019 0913   BUN 10 01/07/2019 0913   BUN 12 12/04/2017   CREATININE 0.78 01/07/2019 0913   GLU 94 12/04/2017      Component Value Date/Time   CALCIUM 9.7 01/07/2019  0913   ALKPHOS 131 (H) 01/07/2019 0913   AST 15 01/07/2019 0913   ALT 15 01/07/2019 0913   BILITOT 0.3 01/07/2019 0913       RADIOGRAPHIC STUDIES: Dg Chest 2 View  Result Date: 12/23/2018 CLINICAL DATA:  Cough and dyspnea EXAM: CHEST - 2 VIEW COMPARISON:  12/07/2014 chest radiograph. FINDINGS: Stable cardiomediastinal silhouette with normal heart size. No pneumothorax. New small right pleural effusion. No left pleural effusion. New dense consolidation in right middle lobe. Small anterior right hemidiaphragm eventration. Mild left basilar scarring versus atelectasis. IMPRESSION: New dense right middle lobe consolidation, presumably pneumonia. New small right pleural effusion. Short-term follow-up post treatment chest radiographs advised to document clearance. These results will be called to the ordering clinician or representative by the Radiologist Assistant, and communication documented in the PACS or  [facility] Dashboard. Electronically Signed   By: Ilona Sorrel M.D.   On: 12/23/2018 08:48   Ct Chest Wo Contrast  Result Date: 12/29/2018 CLINICAL DATA:  Evaluate right middle lobe pneumonia. History of breast cancer EXAM: CT CHEST WITHOUT CONTRAST TECHNIQUE: Multidetector CT imaging of the chest was performed following the standard protocol without IV contrast. COMPARISON:  12/22/2018 FINDINGS: Cardiovascular: The heart size appears normal. No pericardial effusion. Aortic atherosclerosis. Calcification in the LAD, left circumflex and RCA coronary arteries noted. Mediastinum/Nodes: Normal appearance of the thyroid gland. The trachea appears patent and is midline. 9 mm right supraclavicular lymph node identified. Low right paratracheal lymph node measures 1 cm, image 60/2. No subcarinal adenopathy. The hilar structures are suboptimally evaluated due to lack of IV contrast material. Lungs/Pleura: No pleural effusion identified. Large masslike density within the anterior basal right upper lobe and anterior right middle lobe is identified 5.5 by 4.3 x 6.3 cm, image 68/2 and image 65/6. The mass appears to extend into the right anterior chest wall, image 64/6. There is also medial extension of the mass into the mediastinum with suspected pericardial involvement. The mass also extends around the anterior and right lateral aspect of the ascending thoracic aorta, image 79/2. Within the lateral right upper lobe there is a focal area of bronchiectasis and subpleural thickening, image 25/7. Upper Abdomen: No acute abnormality. Musculoskeletal: No chest wall mass or suspicious bone lesions identified. IMPRESSION: 1. Large mass within the anteromedial right midlung involving the right upper lobe and right middle lobe. The mass appears to extend into the anterior chest wall, mediastinum and pericardium. This is highly suspicious for neoplastic process. Further investigation with PET-CT is recommended. Electronically Signed    By: Kerby Moors M.D.   On: 12/29/2018 13:51    ASSESSMENT: This is a very pleasant 73 years old white female with highly suspicious lung cancer probably non-small cell carcinoma presented with a large mass involving the right upper lobe and right middle lobe with suspicious lymphadenopathy in the mediastinum and left supraclavicular area.  This could be also metastatic breast cancer but less likely.   PLAN: I had a lengthy discussion with the patient and her husband today about her current status and further investigation to confirm her diagnosis. I personally and independently reviewed the scan images and discussed the result and showed the images to the patient and her husband today. I recommended for the patient to complete the staging work-up by ordering a PET scan as well as MRI of the brain. Once the PET scan is performed, I will arrange for the patient to have biopsy of the most accessible lesion for confirmation of her tissue diagnosis.  I will arrange for the patient to come back for follow-up visit in less than 2 weeks for evaluation and more detailed discussion of her treatment options based on the final staging work-up and biopsy. The patient was advised to call immediately if she has any concerning symptoms in the interval. The patient voices understanding of current disease status and treatment options and is in agreement with the current care plan.  All questions were answered. The patient knows to call the clinic with any problems, questions or concerns. We can certainly see the patient much sooner if necessary.  Thank you so much for allowing me to participate in the care of Autumn Conley. I will continue to follow up the patient with you and assist in her care.  I spent 40 minutes counseling the patient face to face. The total time spent in the appointment was 60 minutes.  Disclaimer: This note was dictated with voice recognition software. Similar sounding words can  inadvertently be transcribed and may not be corrected upon review.   Eilleen Kempf January 07, 2019, 10:01 AM

## 2019-01-07 NOTE — Telephone Encounter (Signed)
Gave avs and calendar ° °

## 2019-01-11 ENCOUNTER — Telehealth: Payer: Self-pay | Admitting: Medical Oncology

## 2019-01-11 NOTE — Telephone Encounter (Signed)
Asking about PET /MRI / and biopsy. Message sent to managed care.

## 2019-01-18 ENCOUNTER — Ambulatory Visit (HOSPITAL_COMMUNITY)
Admission: RE | Admit: 2019-01-18 | Discharge: 2019-01-18 | Disposition: A | Discharge status: Home / Self Care | Payer: Medicare Other | Source: Ambulatory Visit | Attending: Internal Medicine | Admitting: Internal Medicine

## 2019-01-18 ENCOUNTER — Other Ambulatory Visit: Payer: Self-pay

## 2019-01-18 DIAGNOSIS — I2510 Atherosclerotic heart disease of native coronary artery without angina pectoris: Secondary | ICD-10-CM | POA: Diagnosis not present

## 2019-01-18 DIAGNOSIS — C3490 Malignant neoplasm of unspecified part of unspecified bronchus or lung: Secondary | ICD-10-CM | POA: Diagnosis not present

## 2019-01-18 DIAGNOSIS — R911 Solitary pulmonary nodule: Secondary | ICD-10-CM

## 2019-01-18 LAB — GLUCOSE, CAPILLARY: Glucose-Capillary: 98 mg/dL (ref 70–99)

## 2019-01-18 MED ORDER — GADOBUTROL 1 MMOL/ML IV SOLN
7.0000 mL | Freq: Once | INTRAVENOUS | Status: AC | PRN
  Administered 2019-01-18: 7 mL via INTRAVENOUS

## 2019-01-18 MED ORDER — FLUDEOXYGLUCOSE F - 18 (FDG) INJECTION
7.8100 | Freq: Once | INTRAVENOUS | Status: AC | PRN
  Administered 2019-01-18: 7.81 via INTRAVENOUS

## 2019-01-20 ENCOUNTER — Encounter: Payer: Self-pay | Admitting: *Deleted

## 2019-01-20 ENCOUNTER — Other Ambulatory Visit: Payer: Self-pay | Admitting: Internal Medicine

## 2019-01-20 ENCOUNTER — Encounter: Payer: Self-pay | Admitting: Internal Medicine

## 2019-01-20 ENCOUNTER — Inpatient Hospital Stay: Payer: Medicare Other | Attending: Internal Medicine | Admitting: Internal Medicine

## 2019-01-20 ENCOUNTER — Other Ambulatory Visit: Payer: Self-pay

## 2019-01-20 DIAGNOSIS — R918 Other nonspecific abnormal finding of lung field: Secondary | ICD-10-CM | POA: Insufficient documentation

## 2019-01-20 DIAGNOSIS — K589 Irritable bowel syndrome without diarrhea: Secondary | ICD-10-CM | POA: Diagnosis not present

## 2019-01-20 DIAGNOSIS — H409 Unspecified glaucoma: Secondary | ICD-10-CM | POA: Insufficient documentation

## 2019-01-20 DIAGNOSIS — I700 Atherosclerosis of aorta: Secondary | ICD-10-CM | POA: Diagnosis not present

## 2019-01-20 DIAGNOSIS — K219 Gastro-esophageal reflux disease without esophagitis: Secondary | ICD-10-CM | POA: Diagnosis not present

## 2019-01-20 DIAGNOSIS — E7800 Pure hypercholesterolemia, unspecified: Secondary | ICD-10-CM | POA: Diagnosis not present

## 2019-01-20 DIAGNOSIS — M1990 Unspecified osteoarthritis, unspecified site: Secondary | ICD-10-CM | POA: Insufficient documentation

## 2019-01-20 DIAGNOSIS — Z853 Personal history of malignant neoplasm of breast: Secondary | ICD-10-CM | POA: Diagnosis not present

## 2019-01-20 DIAGNOSIS — I341 Nonrheumatic mitral (valve) prolapse: Secondary | ICD-10-CM | POA: Insufficient documentation

## 2019-01-20 DIAGNOSIS — K7581 Nonalcoholic steatohepatitis (NASH): Secondary | ICD-10-CM | POA: Insufficient documentation

## 2019-01-20 DIAGNOSIS — M797 Fibromyalgia: Secondary | ICD-10-CM | POA: Diagnosis not present

## 2019-01-20 DIAGNOSIS — I2510 Atherosclerotic heart disease of native coronary artery without angina pectoris: Secondary | ICD-10-CM | POA: Insufficient documentation

## 2019-01-20 DIAGNOSIS — F329 Major depressive disorder, single episode, unspecified: Secondary | ICD-10-CM | POA: Diagnosis not present

## 2019-01-20 DIAGNOSIS — Z79899 Other long term (current) drug therapy: Secondary | ICD-10-CM | POA: Insufficient documentation

## 2019-01-20 DIAGNOSIS — Z9012 Acquired absence of left breast and nipple: Secondary | ICD-10-CM | POA: Insufficient documentation

## 2019-01-20 DIAGNOSIS — I10 Essential (primary) hypertension: Secondary | ICD-10-CM | POA: Diagnosis not present

## 2019-01-20 DIAGNOSIS — F419 Anxiety disorder, unspecified: Secondary | ICD-10-CM | POA: Insufficient documentation

## 2019-01-20 NOTE — Progress Notes (Signed)
[facility] [phone]   [phone]  OFFICE PROGRESS NOTE  Briscoe Deutscher, DO [address] [place] [address]  DIAGNOSIS: Hypermetabolic right upper lobe lung mass suspicious for lung cancer.  PRIOR THERAPY: None  CURRENT THERAPY: None  INTERVAL HISTORY: Autumn Conley 73 y.o. female returns to the clinic today for follow-up visit.  The patient is feeling fine today with no concerning complaints.  She denied having any current chest pain, shortness of breath, cough or hemoptysis.  She denied having any fever or chills.  She has no nausea, vomiting, diarrhea or constipation.  She denied having any headache or visual changes.  The patient denied having any recent weight loss or night sweats.  She had a PET scan performed recently and she is here for evaluation and discussion of her PET scan results and treatment options.  MEDICAL HISTORY: Past Medical History:  Diagnosis Date   Anxiety    Arthritis    "joints" (11/29/2014)   Autoimmune disorder ([place])    "non-specific"   Cancer of left breast ([place])    Depression    Fibromyalgia    "some; not chronic" (11/29/2014)   GERD (gastroesophageal reflux disease)    Glaucoma of both eyes    Headache    "weekly" (11/29/2014)   Hypercholesteremia    Hypertension    IBS (irritable bowel syndrome)    Mitral valve prolapse    NASH (nonalcoholic steatohepatitis)    Osteoarthritis    PONV (postoperative nausea and vomiting)     ALLERGIES:  has No Known Allergies.  MEDICATIONS:  Current Outpatient Medications  Medication Sig Dispense Refill   albuterol (PROVENTIL HFA;VENTOLIN HFA) 108 (90 Base) MCG/ACT inhaler Inhale 2 puffs into the lungs every 6 (six) hours as needed for wheezing or shortness of breath. 1 Inhaler 2   aspirin EC 81 MG tablet Take 81 mg by mouth daily.     atorvastatin (LIPITOR) 40 MG tablet Take 1 tablet by mouth at bedtime.     Cholecalciferol 1.25 MG  (50000 UT) TABS 50,000 units PO qwk for 12 weeks. 12 tablet 0   fluticasone (FLOVENT HFA) 110 MCG/ACT inhaler Inhale 1 puff into the lungs 2 (two) times daily. 1 Inhaler 12   gabapentin (NEURONTIN) 300 MG capsule Take 1 capsule by mouth daily.     irbesartan (AVAPRO) 150 MG tablet Take 1 tablet by mouth daily.     nystatin (MYCOSTATIN) 100000 UNIT/ML suspension Take 5 mLs (500,000 Units total) by mouth 4 (four) times daily. 60 mL 0   PARoxetine (PAXIL) 20 MG tablet Take 60 mg by mouth daily.      predniSONE (DELTASONE) 5 MG tablet 6-5-4-3-2-1-off 21 tablet 0   Sennosides-Docusate Sodium (PERI-COLACE PO) Take by mouth as needed.     SUMAtriptan (IMITREX) 100 MG tablet Take 1 tablet by mouth 2 (two) times daily as needed. At least 2 hrs between doses as needed bid     vitamin E 100 UNIT capsule Take 400 Units by mouth daily.     No current facility-administered medications for this visit.     SURGICAL HISTORY:  Past Surgical History:  Procedure Laterality Date   ABDOMINAL HYSTERECTOMY  1980   APPENDECTOMY  1953   BREAST BIOPSY Left    BREAST LUMPECTOMY Left    CATARACT EXTRACTION, BILATERAL  2018   MASTECTOMY Left ~ 2009    REVIEW OF SYSTEMS:  Constitutional: positive for fatigue Eyes: negative Ears, nose, mouth, throat,  and face: negative Respiratory: positive for dyspnea on exertion Cardiovascular: negative Gastrointestinal: negative Genitourinary:negative Integument/breast: negative Hematologic/lymphatic: negative Musculoskeletal:negative Neurological: negative Behavioral/Psych: negative Endocrine: negative Allergic/Immunologic: negative   PHYSICAL EXAMINATION: General appearance: alert, cooperative, fatigued and no distress Head: Normocephalic, without obvious abnormality, atraumatic Neck: no adenopathy, no JVD, supple, symmetrical, trachea midline and thyroid not enlarged, symmetric, no tenderness/mass/nodules Lymph nodes: Cervical, supraclavicular, and  axillary nodes normal. Resp: clear to auscultation bilaterally Back: symmetric, no curvature. ROM normal. No CVA tenderness. Cardio: regular rate and rhythm, S1, S2 normal, no murmur, click, rub or gallop GI: soft, non-tender; bowel sounds normal; no masses,  no organomegaly Extremities: extremities normal, atraumatic, no cyanosis or edema Neurologic: Alert and oriented X 3, normal strength and tone. Normal symmetric reflexes. Normal coordination and gait  ECOG PERFORMANCE STATUS: 1 - Symptomatic but completely ambulatory  Blood pressure (!) 150/87, pulse 86, temperature 98.2 F (36.8 C), temperature source Oral, resp. rate 18, height 5' 4"  (1.626 m), weight 157 lb 8 oz (71.4 kg), SpO2 100 %.  LABORATORY DATA: Lab Results  Component Value Date   WBC 6.4 01/07/2019   HGB 11.0 (L) 01/07/2019   HCT 34.3 (L) 01/07/2019   MCV 92.2 01/07/2019   PLT 360 01/07/2019      Chemistry      Component Value Date/Time   NA 139 01/07/2019 0913   NA 141 12/04/2017   K 4.6 01/07/2019 0913   CL 103 01/07/2019 0913   CO2 26 01/07/2019 0913   BUN 10 01/07/2019 0913   BUN 12 12/04/2017   CREATININE 0.78 01/07/2019 0913   GLU 94 12/04/2017      Component Value Date/Time   CALCIUM 9.7 01/07/2019 0913   ALKPHOS 131 (H) 01/07/2019 0913   AST 15 01/07/2019 0913   ALT 15 01/07/2019 0913   BILITOT 0.3 01/07/2019 0913       RADIOGRAPHIC STUDIES: Dg Chest 2 View  Result Date: 12/23/2018 CLINICAL DATA:  Cough and dyspnea EXAM: CHEST - 2 VIEW COMPARISON:  12/07/2014 chest radiograph. FINDINGS: Stable cardiomediastinal silhouette with normal heart size. No pneumothorax. New small right pleural effusion. No left pleural effusion. New dense consolidation in right middle lobe. Small anterior right hemidiaphragm eventration. Mild left basilar scarring versus atelectasis. IMPRESSION: New dense right middle lobe consolidation, presumably pneumonia. New small right pleural effusion. Short-term follow-up post  treatment chest radiographs advised to document clearance. These results will be called to the ordering clinician or representative by the Radiologist Assistant, and communication documented in the PACS or zVision Dashboard. Electronically Signed   By: Ilona Sorrel M.D.   On: 12/23/2018 08:48   Ct Chest Wo Contrast  Result Date: 12/29/2018 CLINICAL DATA:  Evaluate right middle lobe pneumonia. History of breast cancer EXAM: CT CHEST WITHOUT CONTRAST TECHNIQUE: Multidetector CT imaging of the chest was performed following the standard protocol without IV contrast. COMPARISON:  12/22/2018 FINDINGS: Cardiovascular: The heart size appears normal. No pericardial effusion. Aortic atherosclerosis. Calcification in the LAD, left circumflex and RCA coronary arteries noted. Mediastinum/Nodes: Normal appearance of the thyroid gland. The trachea appears patent and is midline. 9 mm right supraclavicular lymph node identified. Low right paratracheal lymph node measures 1 cm, image 60/2. No subcarinal adenopathy. The hilar structures are suboptimally evaluated due to lack of IV contrast material. Lungs/Pleura: No pleural effusion identified. Large masslike density within the anterior basal right upper lobe and anterior right middle lobe is identified 5.5 by 4.3 x 6.3 cm, image 68/2 and image 65/6. The mass  appears to extend into the right anterior chest wall, image 64/6. There is also medial extension of the mass into the mediastinum with suspected pericardial involvement. The mass also extends around the anterior and right lateral aspect of the ascending thoracic aorta, image 79/2. Within the lateral right upper lobe there is a focal area of bronchiectasis and subpleural thickening, image 25/7. Upper Abdomen: No acute abnormality. Musculoskeletal: No chest wall mass or suspicious bone lesions identified. IMPRESSION: 1. Large mass within the anteromedial right midlung involving the right upper lobe and right middle lobe. The  mass appears to extend into the anterior chest wall, mediastinum and pericardium. This is highly suspicious for neoplastic process. Further investigation with PET-CT is recommended. Electronically Signed   By: Kerby Moors M.D.   On: 12/29/2018 13:51   Mr Jeri Cos AC Contrast  Result Date: 01/18/2019 CLINICAL DATA:  Non-small cell lung cancer staging. EXAM: MRI HEAD WITHOUT AND WITH CONTRAST TECHNIQUE: Multiplanar, multiecho pulse sequences of the brain and surrounding structures were obtained without and with intravenous contrast. CONTRAST:  7 mL Gadavist COMPARISON:  03/06/2016 FINDINGS: Brain: There is no evidence of acute infarct, intracranial hemorrhage, mass, midline shift, or extra-axial fluid collection. Generalized cerebral atrophy is mild for age. Patchy T2 hyperintensities in the cerebral white matter and pons have not significantly changed and are nonspecific but compatible with moderate chronic small vessel ischemic disease. No abnormal enhancement is identified. Vascular: Major intracranial vascular flow voids are preserved. Skull and upper cervical spine: Unremarkable bone marrow signal. Sinuses/Orbits: Bilateral cataract extraction. Minimal scattered mucosal thickening in the paranasal sinuses. Clear mastoid air cells. Other: None. IMPRESSION: 1. No evidence of intracranial metastases. 2. Moderate chronic small vessel ischemic disease. Electronically Signed   By: Logan Bores M.D.   On: 01/18/2019 13:18   Nm Pet Image Initial (pi) Skull Base To Thigh  Result Date: 01/18/2019 CLINICAL DATA:  Initial treatment strategy for lung nodule. EXAM: NUCLEAR MEDICINE PET SKULL BASE TO THIGH TECHNIQUE: 7.8 mCi F-18 FDG was injected intravenously. Full-ring PET imaging was performed from the skull base to thigh after the radiotracer. CT data was obtained and used for attenuation correction and anatomic localization. Fasting blood glucose: 98 mg/dl COMPARISON:  CT chest from 12/29/2018 FINDINGS:  Mediastinal blood pool activity: SUV max 2.4 NECK: Symmetric tonsillar activity, likely physiologic. Incidental CT findings: Bilateral common carotid atherosclerotic calcification. CHEST: A right-sided mass along the pericardial margin with mediastinal involvement measures 5.6 by 2.0 cm on image 82/4 with a maximum SUV of 6.0. There is likely a smaller cystic or centrally necrotic portion laterally. This mass is contiguous with a pleural-based likely centrally necrotic lesion anteriorly along the right thorax adjacent to the right middle lobe and right upper lobe measuring 4.8 by 3.8 cm on image 77/4 with maximum SUV 3.7. Incidental CT findings: A left internal mammary lymph node measuring 5 mm in short axis on image 73/4 is not appreciably hypermetabolic. Left mastectomy. Coronary, aortic arch, and branch vessel atherosclerotic vascular disease. ABDOMEN/PELVIS: No significant abnormal hypermetabolic activity in this region. Incidental CT findings: Aortoiliac atherosclerotic vascular disease. Gastric diverticulum noted. Suspected small cyst of the left mid kidney posterolaterally. SKELETON: No significant abnormal hypermetabolic activity in this region. Incidental CT findings: none IMPRESSION: 1. Right thoracic mass with pleural, pericardial, and likely mediastinal components. The lobular anterior pleural component is likely centrally necrotic given the central photopenia, and overall mass has a maximum SUV of 6.0. Possibilities might include thymic malignancy with local invasion, lymphoma, lung cancer  with local spread, metastatic disease, or less likely mesothelioma. Tissue diagnosis is likely warranted. 2.  Aortic Atherosclerosis (ICD10-I70.0).  Coronary atherosclerosis. 3. Gastric diverticulum. Electronically Signed   By: Van Clines M.D.   On: 01/18/2019 09:40    ASSESSMENT AND PLAN: This is a very pleasant 73 years old white female with hypermetabolic right thoracic mass with pericardial, pleural and  mediastinal component suspicious for thymic malignancy with local invasion versus lymphoma versus lung cancer. I personally and independently reviewed the scan images and discussed the results with the patient today. The patient is a never smoker and this could represent thymic carcinoma versus lymphoma or adenocarcinoma of the lung. I recommended for her to see cardiothoracic surgery for consideration of tissue diagnosis. I will arrange for the patient to come back for follow-up visit after her surgical evaluation for discussion of the treatment options. The patient was advised to call immediately if she has any concerning symptoms in the interval. The patient voices understanding of current disease status and treatment options and is in agreement with the current care plan.  All questions were answered. The patient knows to call the clinic with any problems, questions or concerns. We can certainly see the patient much sooner if necessary.  I spent 15 minutes counseling the patient face to face. The total time spent in the appointment was 25 minutes.  Disclaimer: This note was dictated with voice recognition software. Similar sounding words can inadvertently be transcribed and may not be corrected upon review.

## 2019-01-20 NOTE — Progress Notes (Signed)
Oncology Nurse Navigator Documentation  Oncology Nurse Navigator Flowsheets 01/20/2019  Navigator Location [facility]  Referral date to RadOnc/MedOnc -  Navigator Encounter Type Clinic/MDC/I spoke with [name] today.  Per Dr. Julien Nordmann, he would like patient to get tissue DX with [facility].  I spoke with patient about this.  She would like to be seen with Dr. Servando Snare if possible due to he doing her husbands heart surgery.  I called his office to update on patients request.  I will complete referral in Epic.   Telephone -  Abnormal Finding Date 12/23/2018  Patient Visit Type MedOnc  Treatment Phase Abnormal Scans  Barriers/Navigation Needs Education;Coordination of Care  Education Other  Interventions Coordination of Care;Education  Coordination of Care Other  Education Method Verbal  Acuity Level 3  Time Spent with Patient 27

## 2019-01-21 ENCOUNTER — Telehealth: Payer: Self-pay | Admitting: *Deleted

## 2019-01-21 NOTE — Telephone Encounter (Signed)
Oncology Nurse Navigator Documentation  Oncology Nurse Navigator Flowsheets 01/21/2019  Navigator Location [facility]  Referral date to RadOnc/MedOnc -  Navigator Encounter Type Telephone/I received a message from Dr. Everrett Coombe office.  His recommendation is needle biopsy.  I updated Dr. Julien Nordmann who ordered biopsy.  I called patient to update her. She was thankful for the call.   Telephone Outgoing Call  Abnormal Finding Date -  Patient Visit Type -  Treatment Phase Abnormal Scans  Barriers/Navigation Needs Education  Education Other  Interventions Education  Coordination of Care -  Education Method Verbal  Acuity Level 2  Time Spent with Patient 30

## 2019-01-24 ENCOUNTER — Telehealth: Payer: Self-pay | Admitting: Medical Oncology

## 2019-01-24 NOTE — Telephone Encounter (Signed)
Dr Pascal Lux recommends seeing a cardiothoracic surgeon.

## 2019-01-27 ENCOUNTER — Other Ambulatory Visit: Payer: Self-pay | Admitting: Family Medicine

## 2019-01-27 NOTE — Telephone Encounter (Signed)
See note

## 2019-01-27 NOTE — Telephone Encounter (Signed)
Requested medication (s) are due for refill today -yes  Requested medication (s) are on the active medication list -yes  Future visit scheduled -yes  Last refill: historical- patient states she is not completely out - but needs refill  Notes to clinic: Patient is requesting refill-90 day supply( patient uses mail order pharmacy) of medication prescribed by historical prescriber.  Requested Prescriptions  Pending Prescriptions Disp Refills   PARoxetine (PAXIL) 20 MG tablet      Sig: Take 3 tablets (60 mg total) by mouth every morning.     Psychiatry:  Antidepressants - SSRI Passed - 01/27/2019  2:06 PM      Passed - Completed PHQ-2 or PHQ-9 in the last 360 days.      Passed - Valid encounter within last 6 months    Recent Outpatient Visits          4 weeks ago Pneumonia of right middle lobe due to infectious organism [facility])   [place], [place], [place]   1 month ago Cough   [facility] [place], [place]   1 month ago Elevated LFTs   [place] Wallace, [place], DO      Future Appointments            In 1 month Juleen China, [place], DO [place], [facility]            Requested Prescriptions  Pending Prescriptions Disp Refills   PARoxetine (PAXIL) 20 MG tablet      Sig: Take 3 tablets (60 mg total) by mouth every morning.     Psychiatry:  Antidepressants - SSRI Passed - 01/27/2019  2:06 PM      Passed - Completed PHQ-2 or PHQ-9 in the last 360 days.      Passed - Valid encounter within last 6 months    Recent Outpatient Visits          4 weeks ago Pneumonia of right middle lobe due to infectious organism [facility])   [place], [place], [place]   1 month ago Cough   [place], [place]   1 month ago Elevated LFTs   [place] Wallace, [place], DO      Future Appointments            In 1  month Briscoe Deutscher, [place], [place]

## 2019-01-27 NOTE — Telephone Encounter (Signed)
Call to patient- patient states she does not need prednisone she needs the Paxil 20 mg that she takes 3 times /day. She was prescribed this by former provider and now is requesting Dr wallace prescribe for her. Told patient I was glad we called to check and I would send her request.

## 2019-01-28 ENCOUNTER — Other Ambulatory Visit: Payer: Self-pay | Admitting: Student

## 2019-01-28 ENCOUNTER — Other Ambulatory Visit: Payer: Self-pay | Admitting: Radiology

## 2019-01-30 MED ORDER — PAROXETINE HCL 20 MG PO TABS: 60.0000 mg | ORAL_TABLET | ORAL | 0 refills | Status: DC

## 2019-01-31 ENCOUNTER — Ambulatory Visit (HOSPITAL_COMMUNITY)
Admission: RE | Admit: 2019-01-31 | Discharge: 2019-01-31 | Disposition: A | Discharge status: Home / Self Care | Payer: Medicare Other | Source: Ambulatory Visit | Attending: Internal Medicine | Admitting: Internal Medicine

## 2019-01-31 ENCOUNTER — Other Ambulatory Visit: Payer: Self-pay

## 2019-01-31 ENCOUNTER — Encounter (HOSPITAL_COMMUNITY): Payer: Self-pay

## 2019-01-31 ENCOUNTER — Ambulatory Visit (HOSPITAL_COMMUNITY): Admission: RE | Admit: 2019-01-31 | Payer: Medicare Other | Source: Ambulatory Visit

## 2019-01-31 ENCOUNTER — Ambulatory Visit (HOSPITAL_COMMUNITY)
Admission: RE | Admit: 2019-01-31 | Discharge: 2019-01-31 | Disposition: A | Discharge status: Home / Self Care | Payer: Medicare Other | Source: Ambulatory Visit | Attending: Diagnostic Radiology | Admitting: Diagnostic Radiology

## 2019-01-31 DIAGNOSIS — M1990 Unspecified osteoarthritis, unspecified site: Secondary | ICD-10-CM | POA: Insufficient documentation

## 2019-01-31 DIAGNOSIS — E7800 Pure hypercholesterolemia, unspecified: Secondary | ICD-10-CM | POA: Insufficient documentation

## 2019-01-31 DIAGNOSIS — I2510 Atherosclerotic heart disease of native coronary artery without angina pectoris: Secondary | ICD-10-CM | POA: Insufficient documentation

## 2019-01-31 DIAGNOSIS — I10 Essential (primary) hypertension: Secondary | ICD-10-CM | POA: Diagnosis not present

## 2019-01-31 DIAGNOSIS — M797 Fibromyalgia: Secondary | ICD-10-CM | POA: Insufficient documentation

## 2019-01-31 DIAGNOSIS — K219 Gastro-esophageal reflux disease without esophagitis: Secondary | ICD-10-CM | POA: Insufficient documentation

## 2019-01-31 DIAGNOSIS — R918 Other nonspecific abnormal finding of lung field: Secondary | ICD-10-CM | POA: Diagnosis not present

## 2019-01-31 DIAGNOSIS — C3490 Malignant neoplasm of unspecified part of unspecified bronchus or lung: Secondary | ICD-10-CM | POA: Insufficient documentation

## 2019-01-31 DIAGNOSIS — Z79899 Other long term (current) drug therapy: Secondary | ICD-10-CM | POA: Insufficient documentation

## 2019-01-31 DIAGNOSIS — I341 Nonrheumatic mitral (valve) prolapse: Secondary | ICD-10-CM | POA: Insufficient documentation

## 2019-01-31 DIAGNOSIS — H409 Unspecified glaucoma: Secondary | ICD-10-CM | POA: Insufficient documentation

## 2019-01-31 DIAGNOSIS — F419 Anxiety disorder, unspecified: Secondary | ICD-10-CM | POA: Diagnosis not present

## 2019-01-31 DIAGNOSIS — Z7982 Long term (current) use of aspirin: Secondary | ICD-10-CM | POA: Insufficient documentation

## 2019-01-31 DIAGNOSIS — K589 Irritable bowel syndrome without diarrhea: Secondary | ICD-10-CM | POA: Diagnosis not present

## 2019-01-31 DIAGNOSIS — I96 Gangrene, not elsewhere classified: Secondary | ICD-10-CM | POA: Diagnosis not present

## 2019-01-31 DIAGNOSIS — F329 Major depressive disorder, single episode, unspecified: Secondary | ICD-10-CM | POA: Diagnosis not present

## 2019-01-31 DIAGNOSIS — R222 Localized swelling, mass and lump, trunk: Secondary | ICD-10-CM | POA: Diagnosis not present

## 2019-01-31 DIAGNOSIS — K7581 Nonalcoholic steatohepatitis (NASH): Secondary | ICD-10-CM | POA: Diagnosis not present

## 2019-01-31 DIAGNOSIS — Z853 Personal history of malignant neoplasm of breast: Secondary | ICD-10-CM | POA: Diagnosis not present

## 2019-01-31 DIAGNOSIS — K314 Gastric diverticulum: Secondary | ICD-10-CM | POA: Insufficient documentation

## 2019-01-31 DIAGNOSIS — Z9889 Other specified postprocedural states: Secondary | ICD-10-CM

## 2019-01-31 LAB — CBC
HCT: 34 % — ABNORMAL LOW (ref 36.0–46.0)
Hemoglobin: 11.2 g/dL — ABNORMAL LOW (ref 12.0–15.0)
MCH: 29.6 pg (ref 26.0–34.0)
MCHC: 32.9 g/dL (ref 30.0–36.0)
MCV: 89.9 fL (ref 80.0–100.0)
Platelets: 250 10*3/uL (ref 150–400)
RBC: 3.78 MIL/uL — ABNORMAL LOW (ref 3.87–5.11)
RDW: 13.4 % (ref 11.5–15.5)
WBC: 4.6 10*3/uL (ref 4.0–10.5)
nRBC: 0 % (ref 0.0–0.2)

## 2019-01-31 LAB — APTT: aPTT: 28 seconds (ref 24–36)

## 2019-01-31 LAB — PROTIME-INR
INR: 0.9 (ref 0.8–1.2)
Prothrombin Time: 12.4 seconds (ref 11.4–15.2)

## 2019-01-31 MED ORDER — SODIUM CHLORIDE 0.9 % IV SOLN: INTRAVENOUS | Status: DC

## 2019-01-31 MED ORDER — MIDAZOLAM HCL 2 MG/2ML IJ SOLN
INTRAMUSCULAR | Status: AC | PRN
  Administered 2019-01-31: 1 mg via INTRAVENOUS
  Administered 2019-01-31: 0.5 mg via INTRAVENOUS
  Administered 2019-01-31: 1 mg via INTRAVENOUS

## 2019-01-31 MED ORDER — HYDROCODONE-ACETAMINOPHEN 5-325 MG PO TABS: 1.0000 | ORAL_TABLET | ORAL | Status: DC | PRN

## 2019-01-31 MED ORDER — LIDOCAINE HCL 1 % IJ SOLN
INTRAMUSCULAR | Status: AC
  Filled 2019-01-31: qty 20

## 2019-01-31 MED ORDER — FENTANYL CITRATE (PF) 100 MCG/2ML IJ SOLN
INTRAMUSCULAR | Status: AC | PRN
  Administered 2019-01-31 (×2): 50 ug via INTRAVENOUS

## 2019-01-31 MED ORDER — MIDAZOLAM HCL 2 MG/2ML IJ SOLN
INTRAMUSCULAR | Status: AC
  Filled 2019-01-31: qty 4

## 2019-01-31 MED ORDER — FENTANYL CITRATE (PF) 100 MCG/2ML IJ SOLN
INTRAMUSCULAR | Status: AC
  Filled 2019-01-31: qty 2

## 2019-01-31 MED ORDER — SODIUM CHLORIDE 0.9 % IV SOLN
INTRAVENOUS | Status: AC | PRN
  Administered 2019-01-31: 10 mL/h via INTRAVENOUS

## 2019-01-31 NOTE — Discharge Instructions (Signed)
Needle Biopsy of the Lung, Care After °This sheet gives you information about how to care for yourself after your procedure. Your health care provider may also give you more specific instructions. If you have problems or questions, contact your health care provider. °What can I expect after the procedure? °After the procedure, it is common to have: °· Soreness, pain, and tenderness where a tissue sample was taken (biopsy site). °· A cough. °· A sore throat. °Follow these instructions at home: °Biopsy site care °· Follow instructions from your health care provider about when to remove the bandage that was placed on the biopsy site. °· Keep the bandage dry until it has been removed. °· Check your biopsy site every day for signs of infection. Check for: °? More redness, swelling, or pain. °? More fluid or blood. °? Warmth to the touch. °? Pus or a bad smell. °General instructions ° °· Rest as directed by your health care provider. Ask your health care provider what activities are safe for you. °· Do not take baths, swim, or use a hot tub until your health care provider approves. °· Take over-the-counter and prescription medicines only as told by your health care provider. °· If you have airplane travel scheduled, talk with your health care provider about when it is safe for you to travel by airplane. °· It is up to you to get the results of your procedure. Ask your health care provider, or the department that is doing the procedure, when your results will be ready. °· Keep all follow-up visits as told by your health care provider. This is important. °Contact a health care provider if: °· You have more redness, swelling, or pain around your biopsy site. °· You have more fluid or blood coming from your biopsy site. °· Your biopsy site feels warm to the touch. °· You have pus or a bad smell coming from your biopsy site. °· You have a fever. °· You have pain that does not get better with medicine. °Get help right away  if: °· You have problems breathing. °· You have chest pain. °· You cough up blood. °· You faint. °· You have a fast heart rate. °Summary °· After a needle biopsy of the lung, it is common to have a cough, a sore throat, or soreness, pain, and tenderness where a tissue sample was taken (biopsy site). °· You should check your biopsy area every day for signs of infection, including pus or a bad smell, warmth, more fluid or blood, or more redness, swelling, or pain. °· You should not take baths, swim, or use a hot tub until your health care provider approves. °· It is up to you to get the results of your procedure. Ask your health care provider, or the department that is doing the procedure, when your results will be ready. °This information is not intended to replace advice given to you by your health care provider. Make sure you discuss any questions you have with your health care provider. °Document Released: 08/03/2007 Document Revised: 08/27/2016 Document Reviewed: 08/27/2016 °Elsevier Interactive Patient Education © 2019 [employer] ° ° ° °Moderate Conscious Sedation, Adult, Care After °These instructions provide you with information about caring for yourself after your procedure. Your health care provider may also give you more specific instructions. Your treatment has been planned according to current medical practices, but problems sometimes occur. Call your health care provider if you have any problems or questions after your procedure. °What can I expect   after the procedure? °After your procedure, it is common: °· To feel sleepy for several hours. °· To feel clumsy and have poor balance for several hours. °· To have poor judgment for several hours. °· To vomit if you eat too soon. °Follow these instructions at home: °For at least 24 hours after the procedure: ° °· Do not: °? Participate in activities where you could fall or become injured. °? Drive. °? Use heavy machinery. °? Drink alcohol. °? Take sleeping  pills or medicines that cause drowsiness. °? Make important decisions or sign legal documents. °? Take care of children on your own. °· Rest. °Eating and drinking °· Follow the diet recommended by your health care provider. °· If you vomit: °? Drink water, juice, or soup when you can drink without vomiting. °? Make sure you have little or no nausea before eating solid foods. °General instructions °· Have a responsible adult stay with you until you are awake and alert. °· Take over-the-counter and prescription medicines only as told by your health care provider. °· If you smoke, do not smoke without supervision. °· Keep all follow-up visits as told by your health care provider. This is important. °Contact a health care provider if: °· You keep feeling nauseous or you keep vomiting. °· You feel light-headed. °· You develop a rash. °· You have a fever. °Get help right away if: °· You have trouble breathing. °This information is not intended to replace advice given to you by your health care provider. Make sure you discuss any questions you have with your health care provider. °Document Released: 07/27/2013 Document Revised: 03/10/2016 Document Reviewed: 01/26/2016 °Elsevier Interactive Patient Education © 2019 [employer] ° ° °

## 2019-01-31 NOTE — H&P (Addendum)
Chief Complaint: Patient was seen in consultation today for right lung mass biopsy at the request of [facility]  Referring Physician(s): Mohamed,Mohamed  Supervising Physician: Markus Daft  Patient Status: [facility] - Out-pt  History of Present Illness: Autumn Conley is a 73 y.o. female   Hx Breast Ca 2004 Followed and treated in [place], [place]  New coughing symptoms and chest pain CT 3/11: IMPRESSION: 1. Large mass within the anteromedial right midlung involving the right upper lobe and right middle lobe. The mass appears to extend into the anterior chest wall, mediastinum and pericardium. This is highly suspicious for neoplastic process. Further investigation with PET-CT is recommended.  PET 3/31: IMPRESSION: 1. Right thoracic mass with pleural, pericardial, and likely mediastinal components. The lobular anterior pleural component is likely centrally necrotic given the central photopenia, and overall mass has a maximum SUV of 6.0. Possibilities might include thymic malignancy with local invasion, lymphoma, lung cancer with local spread, metastatic disease, or less likely mesothelioma. Tissue diagnosis is likely warranted. 2.  Aortic Atherosclerosis (ICD10-I70.0).  Coronary atherosclerosis. 3. Gastric diverticulum.  Referred to Dr Clearnce Sorrel with Dr Servando Snare: Rec biopsy  Now scheduled for RUL mass biopsy   Past Medical History:  Diagnosis Date  . Anxiety   . Arthritis    "joints" (11/29/2014)  . Autoimmune disorder ([place])    "non-specific"  . Cancer of left breast ([place])   . Depression   . Fibromyalgia    "some; not chronic" (11/29/2014)  . GERD (gastroesophageal reflux disease)   . Glaucoma of both eyes   . Headache    "weekly" (11/29/2014)  . Hypercholesteremia   . Hypertension   . IBS (irritable bowel syndrome)   . Mitral valve prolapse   . NASH (nonalcoholic steatohepatitis)   . Osteoarthritis   . PONV (postoperative nausea and vomiting)      Past Surgical History:  Procedure Laterality Date  . ABDOMINAL HYSTERECTOMY  1980  . APPENDECTOMY  1953  . BREAST BIOPSY Left   . BREAST LUMPECTOMY Left   . CATARACT EXTRACTION, BILATERAL  2018  . MASTECTOMY Left ~ 2009    Allergies: Patient has no known allergies.  Medications: Prior to Admission medications   Medication Sig Start Date End Date Taking? Authorizing Provider  Ascorbic Acid (VITAMIN C) 1000 MG tablet Take 1,000 mg by mouth daily.   Yes [provider]  aspirin EC 81 MG tablet Take 81 mg by mouth daily.   Yes [provider]  atorvastatin (LIPITOR) 40 MG tablet Take 1 tablet by mouth at bedtime. 12/03/18  Yes [provider]  Cholecalciferol 1.25 MG (50000 UT) TABS 50,000 units PO qwk for 12 weeks. Patient taking differently: Take 50,000 Units by mouth once a week. 50,000 units PO qwk for 12 weeks. On Mondays 12/20/18  Yes Briscoe Deutscher, DO  cycloSPORINE (RESTASIS) 0.05 % ophthalmic emulsion Place 1 drop into both eyes 2 (two) times daily.   Yes [provider]  gabapentin (NEURONTIN) 300 MG capsule Take 300 mg by mouth at bedtime.  10/14/18  Yes [provider]  irbesartan (AVAPRO) 150 MG tablet Take 150 mg by mouth daily.  11/03/18  Yes [provider]  PARoxetine (PAXIL) 20 MG tablet Take 3 tablets (60 mg total) by mouth every morning. 01/30/19 04/30/19 Yes Briscoe Deutscher, DO  senna-docusate (PERI-COLACE) 8.6-50 MG tablet Take 1 tablet by mouth daily as needed for mild constipation.    Yes [provider]  vitamin E 400 UNIT capsule Take 400  Units by mouth daily.    Yes [provider]  albuterol (PROVENTIL HFA;VENTOLIN HFA) 108 (90 Base) MCG/ACT inhaler Inhale 2 puffs into the lungs every 6 (six) hours as needed for wheezing or shortness of breath. Patient not taking: Reported on 01/25/2019 12/22/18   [name], PA  fluticasone (FLOVENT HFA) 110 MCG/ACT inhaler Inhale 1 puff into the lungs 2 (two)  times daily. Patient not taking: Reported on 01/25/2019 12/08/18   Briscoe Deutscher, DO  nystatin (MYCOSTATIN) 100000 UNIT/ML suspension Take 5 mLs (500,000 Units total) by mouth 4 (four) times daily. Patient not taking: Reported on 01/25/2019 12/30/18   Briscoe Deutscher, DO  predniSONE (DELTASONE) 5 MG tablet 6-5-4-3-2-1-off Patient not taking: Reported on 01/25/2019 12/22/18   [name], PA  SUMAtriptan (IMITREX) 100 MG tablet Take 1 tablet by mouth 2 (two) times daily as needed. At least 2 hrs between doses as needed bid 10/14/18   [provider]     Family History  Problem Relation Age of Onset  . Lung cancer Mother 42  . Prostate cancer Brother 56    Social History   Socioeconomic History  . Marital status: Married    Spouse name: Not on file  . Number of children: 1  . Years of education: 15  . Highest education level: Not on file  Occupational History  . Occupation: Retired  Scientific laboratory technician  . Financial resource strain: Not on file  . Food insecurity:    Worry: Not on file    Inability: Not on file  . Transportation needs:    Medical: Not on file    Non-medical: Not on file  Tobacco Use  . Smoking status: Never Smoker  . Smokeless tobacco: Never Used  Substance and Sexual Activity  . Alcohol use: No  . Drug use: No  . Sexual activity: Never  Lifestyle  . Physical activity:    Days per week: Not on file    Minutes per session: Not on file  . Stress: Not on file  Relationships  . Social connections:    Talks on phone: Not on file    Gets together: Not on file    Attends religious service: Not on file    Active member of club or organization: Not on file    Attends meetings of clubs or organizations: Not on file    Relationship status: Not on file  Other Topics Concern  . Not on file  Social History Narrative   Lives at home with her husband.   Right-handed.   2 cups caffeine/day.    Review of Systems: A 12 point ROS discussed and pertinent positives  are indicated in the HPI above.  All other systems are negative.  Review of Systems  Constitutional: Negative for activity change, fatigue and fever.  Respiratory: Negative for cough and shortness of breath.   Cardiovascular: Positive for chest pain.  Gastrointestinal: Negative for abdominal pain.  Neurological: Negative for weakness.  Psychiatric/Behavioral: Negative for behavioral problems and confusion.    Vital Signs: BP 139/75   Pulse 84   Temp 98.1 F (36.7 C) (Oral)   Ht 5' 3"  (1.6 m)   Wt 156 lb (70.8 kg)   SpO2 97%   BMI 27.63 kg/m   Physical Exam Vitals signs reviewed.  Constitutional:      Appearance: Normal appearance.  Cardiovascular:     Rate and Rhythm: Normal rate and regular rhythm.     Heart sounds: Normal heart sounds.  Pulmonary:  Breath sounds: Normal breath sounds.  Abdominal:     General: Bowel sounds are normal.  Musculoskeletal: Normal range of motion.  Skin:    General: Skin is warm and dry.  Neurological:     Mental Status: She is alert and oriented to person, place, and time.  Psychiatric:        Mood and Affect: Mood normal.        Behavior: Behavior normal.        Thought Content: Thought content normal.        Judgment: Judgment normal.     Imaging: Mr Jeri Cos Wo Contrast  Result Date: 01/18/2019 CLINICAL DATA:  Non-small cell lung cancer staging. EXAM: MRI HEAD WITHOUT AND WITH CONTRAST TECHNIQUE: Multiplanar, multiecho pulse sequences of the brain and surrounding structures were obtained without and with intravenous contrast. CONTRAST:  7 mL Gadavist COMPARISON:  03/06/2016 FINDINGS: Brain: There is no evidence of acute infarct, intracranial hemorrhage, mass, midline shift, or extra-axial fluid collection. Generalized cerebral atrophy is mild for age. Patchy T2 hyperintensities in the cerebral white matter and pons have not significantly changed and are nonspecific but compatible with moderate chronic small vessel ischemic disease.  No abnormal enhancement is identified. Vascular: Major intracranial vascular flow voids are preserved. Skull and upper cervical spine: Unremarkable bone marrow signal. Sinuses/Orbits: Bilateral cataract extraction. Minimal scattered mucosal thickening in the paranasal sinuses. Clear mastoid air cells. Other: None. IMPRESSION: 1. No evidence of intracranial metastases. 2. Moderate chronic small vessel ischemic disease. Electronically Signed   By: Logan Bores M.D.   On: 01/18/2019 13:18   Nm Pet Image Initial (pi) Skull Base To Thigh  Result Date: 01/18/2019 CLINICAL DATA:  Initial treatment strategy for lung nodule. EXAM: NUCLEAR MEDICINE PET SKULL BASE TO THIGH TECHNIQUE: 7.8 mCi F-18 FDG was injected intravenously. Full-ring PET imaging was performed from the skull base to thigh after the radiotracer. CT data was obtained and used for attenuation correction and anatomic localization. Fasting blood glucose: 98 mg/dl COMPARISON:  CT chest from 12/29/2018 FINDINGS: Mediastinal blood pool activity: SUV max 2.4 NECK: Symmetric tonsillar activity, likely physiologic. Incidental CT findings: Bilateral common carotid atherosclerotic calcification. CHEST: A right-sided mass along the pericardial margin with mediastinal involvement measures 5.6 by 2.0 cm on image 82/4 with a maximum SUV of 6.0. There is likely a smaller cystic or centrally necrotic portion laterally. This mass is contiguous with a pleural-based likely centrally necrotic lesion anteriorly along the right thorax adjacent to the right middle lobe and right upper lobe measuring 4.8 by 3.8 cm on image 77/4 with maximum SUV 3.7. Incidental CT findings: A left internal mammary lymph node measuring 5 mm in short axis on image 73/4 is not appreciably hypermetabolic. Left mastectomy. Coronary, aortic arch, and branch vessel atherosclerotic vascular disease. ABDOMEN/PELVIS: No significant abnormal hypermetabolic activity in this region. Incidental CT findings:  Aortoiliac atherosclerotic vascular disease. Gastric diverticulum noted. Suspected small cyst of the left mid kidney posterolaterally. SKELETON: No significant abnormal hypermetabolic activity in this region. Incidental CT findings: none IMPRESSION: 1. Right thoracic mass with pleural, pericardial, and likely mediastinal components. The lobular anterior pleural component is likely centrally necrotic given the central photopenia, and overall mass has a maximum SUV of 6.0. Possibilities might include thymic malignancy with local invasion, lymphoma, lung cancer with local spread, metastatic disease, or less likely mesothelioma. Tissue diagnosis is likely warranted. 2.  Aortic Atherosclerosis (ICD10-I70.0).  Coronary atherosclerosis. 3. Gastric diverticulum. Electronically Signed   By: Cindra Eves.D.  On: 01/18/2019 09:40    Labs:  CBC: Recent Labs    12/08/18 1215 01/07/19 0913 01/31/19 0605  WBC 3.9* 6.4 4.6  HGB 12.1 11.0* 11.2*  HCT 35.2* 34.3* 34.0*  PLT 232.0 360 250    COAGS: Recent Labs    01/31/19 0605  INR 0.9  APTT 28    BMP: Recent Labs    12/08/18 1215 01/07/19 0913  NA 138 139  K 4.5 4.6  CL 102 103  CO2 28 26  GLUCOSE 83 101*  BUN 12 10  CALCIUM 9.4 9.7  CREATININE 0.72 0.78  GFRNONAA  --  >60  GFRAA  --  >60    LIVER FUNCTION TESTS: Recent Labs    12/08/18 1215 01/07/19 0913  BILITOT 0.4 0.3  AST 18 15  ALT 17 15  ALKPHOS 111 131*  PROT 7.1 7.9  ALBUMIN 4.3 3.7    TUMOR MARKERS: No results for input(s): AFPTM, CEA, CA199, CHROMGRNA in the last 8760 hours.  Assessment and Plan:  Hx Breast Ca 2004 New CP and cough R UL mass noted on imaging and +PET Now for biopsy of same Risks and benefits of CT guided lung nodule biopsy was discussed with the patient including, but not limited to bleeding, hemoptysis, respiratory failure requiring intubation, infection, pneumothorax requiring chest tube placement, stroke from air embolism or even  death.  All of the patient's questions were answered and the patient is agreeable to proceed.  Consent signed and in chart.   Thank you for this interesting consult.  I greatly enjoyed meeting Kamaree Berkel and look forward to participating in their care.  A copy of this report was sent to the requesting provider on this date.  Electronically Signed: Lavonia Drafts, PA-C 01/31/2019, 8:11 AM   I spent a total of  30 Minutes   in face to face in clinical consultation, greater than 50% of which was counseling/coordinating care for Rt lung mass biopsy

## 2019-01-31 NOTE — Procedures (Signed)
Interventional Radiology Procedure:   Indications: Right chest and mediastinal mass, history of breast cancer  Procedure: CT guided core biopsy of right pleural based mass  Findings: 3 cores from right pleural based mass  Complications: None     EBL: Less than 5 ml  Plan: CXR in one hour and anticipate discharge in 2 - 3 hours.    Maxene Byington R. Anselm Pancoast, MD  Pager: [phone]

## 2019-02-03 ENCOUNTER — Telehealth: Payer: Self-pay | Admitting: Cardiothoracic Surgery

## 2019-02-03 ENCOUNTER — Other Ambulatory Visit: Payer: Self-pay

## 2019-02-03 NOTE — Progress Notes (Signed)
[address]       [place] [address]             [phone]                    [place] Record #[id number] Date of Birth: 1945/11/17  Referring: Autumn Bears, MD Primary Care: Autumn Deutscher, DO Primary Cardiologist: No primary care provider on file.  Chief Complaint:    Chief Complaint  Patient presents with   Lung Mass    Surgical eval, PET scan  and MRI Brain 01/18/19, CT BX 01/31/19, Chest CT 12/29/18    History of Present Illness:    Autumn Conley 73 y.o. female is seen in the office  today for evaluation of right lung mass.  Patient is a lifelong non-smoker , she notes that her symptoms began approximately 2 months ago.  She began having upper respiratory symptoms with cough and some sputum production she was treated as an outpatient with 3 rounds of antibiotics she denied any fever or chills denies hemoptysis ultimately a CT scan was performed which demonstrated findings noted a right pleural base mass extending to the mediastinum.  Confirmed on PET scan.  Attempted needle biopsy resulted in necrotic tissue.  The patient is been referred for further diagnostic studies including obtain a tissue diagnosis.  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   CT-guided needle biopsy results   Soft Tissue Needle Core Biopsy, right pleural - NECROTIC TISSUE. SEE NOTE Diagnosis Note The specimen is almost entirely necrotic. This precludes a definitive assessment but the findings are worrisome for a necrotic neoplasm. Dr. Melina Conley has reviewed this case and concurs with the above interpretation. Autumn Folds MD  Current Activity/ Functional Status:  Patient is independent with mobility/ambulation, transfers, ADL's, IADL's.   Zubrod Score: At the time of surgery this patients most appropriate activity status/level  should be described as: [x]     0    Normal activity, no symptoms []     1    Restricted in physical strenuous activity but ambulatory, able to do out light work []     2    Ambulatory and capable of self care, unable to do work activities, up and about               >50 % of waking hours                              []     3    Only limited self care, in bed greater than 50% of waking hours []     4    Completely disabled, no self care, confined to bed or chair []     5    Moribund   Past Medical History:  Diagnosis Date   Anxiety    Arthritis    "joints" (11/29/2014)   Autoimmune disorder ([place])    "non-specific"   Cancer of left breast (Autumn Conley)    Depression    Fibromyalgia    "some; not chronic" (11/29/2014)   GERD (gastroesophageal reflux disease)    Glaucoma of both eyes    Headache    "weekly" (11/29/2014)   Hypercholesteremia    Hypertension    IBS (irritable bowel syndrome)    Mitral valve prolapse  NASH (nonalcoholic steatohepatitis)    Osteoarthritis    PONV (postoperative nausea and vomiting)     Past Surgical History:  Procedure Laterality Date   ABDOMINAL HYSTERECTOMY  1980   APPENDECTOMY  1953   BREAST BIOPSY Left    BREAST LUMPECTOMY Left    CATARACT EXTRACTION, BILATERAL  2018   MASTECTOMY Left ~ 2009    Family History  Problem Relation Age of Onset   Lung cancer Mother 6   Prostate cancer Brother 37     Social History   Tobacco Use  Smoking Status Never Smoker  Smokeless Tobacco Never Used    Social History   Substance and Sexual Activity  Alcohol Use No     No Known Allergies  Current Outpatient Medications  Medication Sig Dispense Refill   Ascorbic Acid (VITAMIN C) 1000 MG tablet Take 1,000 mg by mouth daily.     aspirin EC 81 MG tablet Take 81 mg by mouth daily.     atorvastatin (LIPITOR) 40 MG tablet Take 1 tablet by mouth at bedtime.     Cholecalciferol 1.25 MG (50000 UT) TABS 50,000 units PO qwk for 12  weeks. (Patient taking differently: Take 50,000 Units by mouth once a week. 50,000 units PO qwk for 12 weeks. On Mondays) 12 tablet 0   cycloSPORINE (RESTASIS) 0.05 % ophthalmic emulsion Place 1 drop into both eyes 2 (two) times daily.     gabapentin (NEURONTIN) 300 MG capsule Take 300 mg by mouth at bedtime.      irbesartan (AVAPRO) 150 MG tablet Take 150 mg by mouth daily.      nystatin (MYCOSTATIN) 100000 UNIT/ML suspension Take 5 mLs (500,000 Units total) by mouth 4 (four) times daily. 60 mL 0   PARoxetine (PAXIL) 20 MG tablet Take 3 tablets (60 mg total) by mouth every morning. 270 tablet 0   predniSONE (DELTASONE) 5 MG tablet 6-5-4-3-2-1-off 21 tablet 0   senna-docusate (PERI-COLACE) 8.6-50 MG tablet Take 1 tablet by mouth daily as needed for mild constipation.      SUMAtriptan (IMITREX) 100 MG tablet Take 1 tablet by mouth 2 (two) times daily as needed. At least 2 hrs between doses as needed bid     vitamin E 400 UNIT capsule Take 400 Units by mouth daily.      albuterol (PROVENTIL HFA;VENTOLIN HFA) 108 (90 Base) MCG/ACT inhaler Inhale 2 puffs into the lungs every 6 (six) hours as needed for wheezing or shortness of breath. (Patient not taking: Reported on 01/25/2019) 1 Inhaler 2   fluticasone (FLOVENT HFA) 110 MCG/ACT inhaler Inhale 1 puff into the lungs 2 (two) times daily. (Patient not taking: Reported on 01/25/2019) 1 Inhaler 12   No current facility-administered medications for this visit.     Pertinent items are noted in HPI.   Review of Systems:     Cardiac Review of Systems: [Y] = yes  or   [ N ] = no   Chest Pain [ n   ]  Resting SOB [ n  ] Exertional SOB  [n  ]  Orthopnea [ n ]   Pedal Edema [ n  ]    Palpitations [n  ] Syncope  [n  ]   Presyncope [ n  ]   General Review of Systems: [Y] = yes [  ]=no Constitional: recent weight change [  ];  Wt loss over the last 3 months [   ] anorexia [  ]; fatigue [ y ]; nausea [  ];  night sweats [  ]; fever [  ]; or chills [  ];            Eye : blurred vision [  ]; diplopia [   ]; vision changes [  ];  Amaurosis fugax[  ]; Resp: cough [  ];  wheezing[  ];  hemoptysis[  ]; shortness of breath[  ]; paroxysmal nocturnal dyspnea[  ]; dyspnea on exertion[  ]; or orthopnea[  ];  GI:  gallstones[  ], vomiting[  ];  dysphagia[  ]; melena[  ];  hematochezia [  ]; heartburn[  ];   Hx of  Colonoscopy[  ]; GU: kidney stones [  ]; hematuria[  ];   dysuria [  ];  nocturia[  ];  history of     obstruction [  ]; urinary frequency [  ]             Skin: rash, swelling[  ];, hair loss[  ];  peripheral edema[  ];  or itching[  ]; Musculosketetal: myalgias[  ];  joint swelling[  ];  joint erythema[  ];  joint pain[  ];  back pain[  ];  Heme/Lymph: bruising[  ];  bleeding[  ];  anemia[  ];  Neuro: TIA[  ];  headaches[y  ];  stroke[  ];  vertigo[  ];  seizures[  ];   paresthesias[ y ];  difficulty walking[  ];  Psych:depression[  ]; anxiety[ y ];  Endocrine: diabetes[  ];  thyroid dysfunction[  ];  Immunizations: Flu up to date [  ]; Pneumococcal up to date [  ];  Other:sleep apnea    PHYSICAL EXAMINATION: BP 134/83    Pulse 88    Temp 97.6 F (36.4 C) (Tympanic)    Resp 20    Ht 5' 3"  (1.6 m)    Wt 156 lb (70.8 kg)    SpO2 97% Comment: RA   BMI 27.63 kg/m  General appearance: alert and cooperative Head: Normocephalic, without obvious abnormality, atraumatic Neck: no adenopathy, no carotid bruit, no JVD, supple, symmetrical, trachea midline and thyroid not enlarged, symmetric, no tenderness/mass/nodules Lymph nodes: Cervical, supraclavicular, and axillary nodes normal. Resp: clear to auscultation bilaterally Back: symmetric, no curvature. ROM normal. No CVA tenderness. Cardio: regular rate and rhythm, S1, S2 normal, no murmur, click, rub or gallop GI: soft, non-tender; bowel sounds normal; no masses,  no organomegaly Extremities: extremities normal, atraumatic, no cyanosis or edema Neurologic: Grossly normal Left mastectomy, over the  inner aspect of the right breast right parasternal area is bruising related to her needle biopsy  Diagnostic Studies & Laboratory data:     Recent Radiology Findings:   Mr Jeri Cos Wo Contrast  Result Date: 01/18/2019 CLINICAL DATA:  Non-small cell lung cancer staging. EXAM: MRI HEAD WITHOUT AND WITH CONTRAST TECHNIQUE: Multiplanar, multiecho pulse sequences of the brain and surrounding structures were obtained without and with intravenous contrast. CONTRAST:  7 mL Gadavist COMPARISON:  03/06/2016 FINDINGS: Brain: There is no evidence of acute infarct, intracranial hemorrhage, mass, midline shift, or extra-axial fluid collection. Generalized cerebral atrophy is mild for age. Patchy T2 hyperintensities in the cerebral white matter and pons have not significantly changed and are nonspecific but compatible with moderate chronic small vessel ischemic disease. No abnormal enhancement is identified. Vascular: Major intracranial vascular flow voids are preserved. Skull and upper cervical spine: Unremarkable bone marrow signal. Sinuses/Orbits: Bilateral cataract extraction. Minimal scattered mucosal thickening in the paranasal sinuses. Clear mastoid air cells. Other:  None. IMPRESSION: 1. No evidence of intracranial metastases. 2. Moderate chronic small vessel ischemic disease. Electronically Signed   By: Logan Bores M.D.   On: 01/18/2019 13:18   Nm Pet Image Initial (pi) Skull Base To Thigh  Result Date: 01/18/2019 CLINICAL DATA:  Initial treatment strategy for lung nodule. EXAM: NUCLEAR MEDICINE PET SKULL BASE TO THIGH TECHNIQUE: 7.8 mCi F-18 FDG was injected intravenously. Full-ring PET imaging was performed from the skull base to thigh after the radiotracer. CT data was obtained and used for attenuation correction and anatomic localization. Fasting blood glucose: 98 mg/dl COMPARISON:  CT chest from 12/29/2018 FINDINGS: Mediastinal blood pool activity: SUV max 2.4 NECK: Symmetric tonsillar activity, likely  physiologic. Incidental CT findings: Bilateral common carotid atherosclerotic calcification. CHEST: A right-sided mass along the pericardial margin with mediastinal involvement measures 5.6 by 2.0 cm on image 82/4 with a maximum SUV of 6.0. There is likely a smaller cystic or centrally necrotic portion laterally. This mass is contiguous with a pleural-based likely centrally necrotic lesion anteriorly along the right thorax adjacent to the right middle lobe and right upper lobe measuring 4.8 by 3.8 cm on image 77/4 with maximum SUV 3.7. Incidental CT findings: A left internal mammary lymph node measuring 5 mm in short axis on image 73/4 is not appreciably hypermetabolic. Left mastectomy. Coronary, aortic arch, and branch vessel atherosclerotic vascular disease. ABDOMEN/PELVIS: No significant abnormal hypermetabolic activity in this region. Incidental CT findings: Aortoiliac atherosclerotic vascular disease. Gastric diverticulum noted. Suspected small cyst of the left mid kidney posterolaterally. SKELETON: No significant abnormal hypermetabolic activity in this region. Incidental CT findings: none IMPRESSION: 1. Right thoracic mass with pleural, pericardial, and likely mediastinal components. The lobular anterior pleural component is likely centrally necrotic given the central photopenia, and overall mass has a maximum SUV of 6.0. Possibilities might include thymic malignancy with local invasion, lymphoma, lung cancer with local spread, metastatic disease, or less likely mesothelioma. Tissue diagnosis is likely warranted. 2.  Aortic Atherosclerosis (ICD10-I70.0).  Coronary atherosclerosis. 3. Gastric diverticulum. Electronically Signed   By: Van Clines M.D.   On: 01/18/2019 09:40   Ct Biopsy  Result Date: 01/31/2019 INDICATION: 73 year old with pleural-based lesion in the anterior right chest and abnormal soft tissue in the anterior mediastinum. Tissue diagnosis is needed. Remote history of breast cancer.  EXAM: CT-GUIDED BIOPSY OF RIGHT CHEST LESION MEDICATIONS: None. ANESTHESIA/SEDATION: Moderate (conscious) sedation was employed during this procedure. A total of Versed 2.5 mg and Fentanyl 100 mcg was administered intravenously. Moderate Sedation Time: 18 minutes. The patient's level of consciousness and vital signs were monitored continuously by radiology nursing throughout the procedure under my direct supervision. FLUOROSCOPY TIME:  None COMPLICATIONS: None immediate. PROCEDURE: Informed written consent was obtained from the patient after a thorough discussion of the procedural risks, benefits and alternatives. All questions were addressed. Maximal Sterile Barrier Technique was utilized including caps, mask, sterile gowns, sterile gloves, sterile drape, hand hygiene and skin antiseptic. A timeout was performed prior to the initiation of the procedure. Patient was placed supine on the CT scanner. Images through the chest were obtained. The anterior pleural-based lesion was targeted. Soft tissue nodularity along the medial aspect of the pleural-based lesion was targeted that corresponds with hypermetabolic activity on the previous PET-CT. Anterior chest was prepped with chlorhexidine. Sterile field was created. Skin and soft tissues anesthetized with 1% lidocaine. 17 gauge coaxial needle directed into the lesion with CT guidance. Needle was positioned along the anterior chest wall. Three core biopsies were obtained  with 18 gauge core device and specimens placed in saline. Needle removed without complication. Bandage placed over the puncture site. FINDINGS: Again noted is a pleural-based lesion in the anterior right lower chest. Needle position confirmed along the anterior pleural tissue. Three core biopsies obtained. Small amount of gas within the lesion at the end of the procedure. Negative for pneumothorax. IMPRESSION: CT-guided core biopsies of the anterior right chest pleural-based lesion. Electronically Signed    By: Markus Daft M.D.   On: 01/31/2019 09:39    CLINICAL DATA:  Evaluate right middle lobe pneumonia. History of breast cancer  EXAM: CT CHEST WITHOUT CONTRAST  TECHNIQUE: Multidetector CT imaging of the chest was performed following the standard protocol without IV contrast.  COMPARISON:  12/22/2018  FINDINGS: Cardiovascular: The heart size appears normal. No pericardial effusion. Aortic atherosclerosis. Calcification in the LAD, left circumflex and RCA coronary arteries noted.  Mediastinum/Nodes: Normal appearance of the thyroid gland. The trachea appears patent and is midline. 9 mm right supraclavicular lymph node identified. Low right paratracheal lymph node measures 1 cm, image 60/2. No subcarinal adenopathy. The hilar structures are suboptimally evaluated due to lack of IV contrast material.  Lungs/Pleura: No pleural effusion identified. Large masslike density within the anterior basal right upper lobe and anterior right middle lobe is identified 5.5 by 4.3 x 6.3 cm, image 68/2 and image 65/6. The mass appears to extend into the right anterior chest wall, image 64/6. There is also medial extension of the mass into the mediastinum with suspected pericardial involvement. The mass also extends around the anterior and right lateral aspect of the ascending thoracic aorta, image 79/2.  Within the lateral right upper lobe there is a focal area of bronchiectasis and subpleural thickening, image 25/7.  Upper Abdomen: No acute abnormality.  Musculoskeletal: No chest wall mass or suspicious bone lesions identified.  IMPRESSION: 1. Large mass within the anteromedial right midlung involving the right upper lobe and right middle lobe. The mass appears to extend into the anterior chest wall, mediastinum and pericardium. This is highly suspicious for neoplastic process. Further investigation with PET-CT is recommended.   Electronically Signed   By: Kerby Moors  M.D.   On: 12/29/2018 13:51 I have independently reviewed the above radiology studies  and reviewed the findings with the patient.   Recent Lab Findings: Lab Results  Component Value Date   WBC 4.6 01/31/2019   HGB 11.2 (L) 01/31/2019   HCT 34.0 (L) 01/31/2019   PLT 250 01/31/2019   GLUCOSE 101 (H) 01/07/2019   CHOL 166 12/08/2018   TRIG 197.0 (H) 12/08/2018   HDL 39.40 12/08/2018   LDLCALC 87 12/08/2018   ALT 15 01/07/2019   AST 15 01/07/2019   NA 139 01/07/2019   K 4.6 01/07/2019   CL 103 01/07/2019   CREATININE 0.78 01/07/2019   BUN 10 01/07/2019   CO2 26 01/07/2019   TSH 1.78 12/08/2018   INR 0.9 01/31/2019   HGBA1C 6.1 (H) 11/30/2014      Assessment / Plan:   Right thoracic mass with pleural, pericardial, and likely mediastinal components-possibilities include thymic carcinoma, lung cancer, lymphoma or possibly necrotic lung from previous infection with surrounding inflammation.  The needle biopsy does suggest necrotic tumor but is non diagnostic.  I discussed and reviewed the CT and PET scan findings with the patient and her husband and with the nondiagnostic biopsy have recommended that we proceed with right parasternal exploration and direct biopsy of the lung mass and anterior mediastinal  mass to obtain a definitive tissue diagnosis, pathologic and microbiology specimens will be obtained.  Risks and options were discussed with the patient and her husband in detail and she is willing to proceed.  Patient is aware of the current coronavirus pandemic and that it does increase the risk of surgical procedures and nosocomial infection, but with the current possibility of ongoing malignancy obtaining an urgent tissue diagnosis is imperative and previous attempts with a less invasive procedure have been non diagnostic.  We will tentatively plan surgery for April 27.  I  spent  60  minutes with  the patient face to face and greater then 50% of the time was spent in counseling and  coordination of care.    [name] MD      [address] [place] [address] Office [phone]   Beeper [phone]  02/04/2019 12:15 PM

## 2019-02-03 NOTE — Telephone Encounter (Signed)
      [address]       [place] [address]             [phone]     Call patient today and reviewed pathology findings with her.  Needle biopsy was done on the right lung mass that showed predominantly necrotic tumor without any specific identifying features of type.  I have explained this to the patient will have a face-to-face appointment tomorrow morning to review surgical options to obtain a definitive tissue diagnosis of her hypermetabolic mass.  [name] MD     [address] [place] [address] Office [phone]   [place]

## 2019-02-04 ENCOUNTER — Encounter: Payer: Self-pay | Admitting: Cardiothoracic Surgery

## 2019-02-04 ENCOUNTER — Institutional Professional Consult (permissible substitution) (INDEPENDENT_AMBULATORY_CARE_PROVIDER_SITE_OTHER): Payer: Medicare Other | Admitting: Cardiothoracic Surgery

## 2019-02-04 VITALS — BP 134/83 | HR 88 | Temp 97.6°F | Resp 20 | Ht 63.0 in | Wt 156.0 lb

## 2019-02-04 DIAGNOSIS — R918 Other nonspecific abnormal finding of lung field: Secondary | ICD-10-CM

## 2019-02-04 DIAGNOSIS — J9859 Other diseases of mediastinum, not elsewhere classified: Secondary | ICD-10-CM

## 2019-02-04 NOTE — Patient Instructions (Addendum)
Stop irbesartan 36 hours before surgery Plan surgery April 27 - hospital will call you   Lung Resection, Care After This sheet gives you information about how to care for yourself after your procedure. Your health care provider may also give you more specific instructions. If you have problems or questions, contact your health care provider. What can I expect after the procedure? After the procedure, it is common to have:  Pain in your throat and near your incisions.  Pain when taking deep breaths.  Nausea.  Tiredness (fatigue). Follow these instructions at home:  Medicines  Take over-the-counter and prescription medicines only as told by your health care provider.  If you were prescribed an antibiotic medicine, take it as told by your health care provider. Do not stop taking the antibiotic even if you start to feel better.  If you are taking prescription pain medicine, take actions to prevent or treat constipation. Your health care provider may recommend that you: ? Drink enough fluid to keep your urine pale yellow. ? Eat foods that are high in fiber, such as fresh fruits and vegetables, whole grains, and beans. ? Limit foods that are high in fat and processed sugars, such as fried or sweet foods. ? Take an over-the-counter or prescription medicine for constipation. Incision care  Follow instructions from your health care provider about how to take care of your incisions. Make sure you: ? Wash your hands with soap and water before you change your bandage (dressing). If soap and water are not available, use hand sanitizer. ? Change your dressing as told by your health care provider. ? Leave stitches (sutures), skin glue, or adhesive strips in place. These skin closures may need to stay in place for 2 weeks or longer. If adhesive strip edges start to loosen and curl up, you may trim the loose edges. Do not remove adhesive strips completely unless your health care provider tells you  to do that.  Check your incision area every day for signs of infection. Check for: ? Redness, swelling, or pain. ? Fluid or blood. ? Pus or a bad smell. ? Warmth.  Do not take baths, swim, or use a hot tub until your health care provider approves. Ask your health care provider if you may take showers. Preventing pneumonia   Do breathing exercises as instructed by your health care provider. Doing this helps prevent lung infection (pneumonia).  Try to breathe deeply and cough as told by your health care provider. Holding a pillow firmly over your ribs may help with discomfort.  If you were given an incentive spirometer in the hospital, continue to use it as directed by your health care provider.  Participate in pulmonary rehabilitation as directed by your health care provider. This is a program that combines education, exercise, and support from a team of specialists. The goal is to help you heal and get back to your normal activities as soon as possible. Activity  Rest as told by your health care provider.  Avoid sitting for a long time without moving. Get up to take short walks every 1-2 hours. This is important to improve blood flow and breathing. Ask for help if you feel weak or unsteady.  Ask your health care provider what activities are safe for you.  Do not lift anything that is heavier than 10 lb (4.5 kg), or the limit that you are told, until your health care provider says that it is safe.  Return to a normal diet and activities  as told by your health care provider. General instructions  Wear compression stockings as told by your health care provider. These stockings help to prevent blood clots and reduce swelling in your legs.  If you have a chest tube, care for it as instructed by your health care provider. Do not travel by airplane during the 2 weeks after your chest tube is removed, or until your health care provider says that this is safe.  Do not use any products that  contain nicotine or tobacco, such as cigarettes and e-cigarettes. These can delay healing after surgery. If you need help quitting, ask your health care provider.  Do not drive until your health care provider approves.  Do not drive or use heavy machinery while taking prescription pain medicine.  Keep all follow-up visits as told by your health care provider. This is important. Contact a health care provider if you:  Have redness, swelling, or pain around your incision.  Have fluid or blood coming from your incision.  Have pus or a bad smell coming from your incision or bandage.  Have an incision that feels warm to the touch.  Have a fever or chills.  Notice that your incision is breaking open.  Cough up blood or pus, or you develop a cough that produces bad-smelling sputum.  Have pain or swelling in your legs.  Have increasing pain that is not controlled with medicine.  Have trouble managing any of the tubes that have been left in place after surgery. Get help right away if you:  Have chest pain or an irregular or rapid heartbeat.  Feel weak, light-headed, or dizzy.  Have shortness of breath or difficulty breathing.  Have persistent nausea or vomiting.  Have a rash. These symptoms may represent a serious problem that is an emergency. Do not wait to see if the symptoms will go away. Get medical help right away. Call your local emergency services (911 in the [place] Do not drive yourself to the hospital. Summary  After lung resection surgery, it is common to have pain around your incisions, pain when taking deep breaths, nausea, and fatigue.  Follow instructions from your health care provider about how to take care of your incisions.  Be sure to contact your health care provider if you have any redness or swelling around your incision area or if blood, pus, or other fluid drains from your incision. This information is not intended to replace advice given to you by your  health care provider. Make sure you discuss any questions you have with your health care provider. Document Released: 04/25/2005 Document Revised: 10/19/2017 Document Reviewed: 10/19/2017 Elsevier Interactive Patient Education  2019 [employer].

## 2019-02-07 ENCOUNTER — Encounter: Payer: Self-pay | Admitting: *Deleted

## 2019-02-07 ENCOUNTER — Other Ambulatory Visit: Payer: Self-pay | Admitting: *Deleted

## 2019-02-07 DIAGNOSIS — R918 Other nonspecific abnormal finding of lung field: Secondary | ICD-10-CM

## 2019-02-08 ENCOUNTER — Telehealth: Payer: Self-pay

## 2019-02-09 NOTE — Pre-Procedure Instructions (Signed)
Kinzie Wickes  02/09/2019      [place] [place] [address] Phone: [phone] Fax: [phone]  [id number] [id number]: [phone]: [phone]   Your procedure is scheduled on Monday, April 27th.  [facility]  Call this number if you have problems the morning of surgery:  [phone]   Remember:  Do not eat or drink after midnight.     Take these medicines the morning of surgery with A SIP OF WATER   Atorvastatin (Lipitor)  Restasis Eye Drops  Gabapentin (Neurontin)  Paroxetine (Paxil)   Follow your surgeon's instructions on when to stop Aspirin.  If no instructions were given by your surgeon then you will need to call the office to get those instructions.     7 days prior to surgery STOP taking any Aspirin (unless otherwise instructed by your surgeon), Aleve, Naproxen, Ibuprofen, Motrin, Advil, Goody's, BC's, all herbal medications, fish oil, and all vitamins.      Do not wear jewelry, make-up or nail polish.  Do not wear lotions, powders, or perfumes, or deodorant.  Do not shave 48 hours prior to surgery.    Do not bring valuables to the hospital.  [facility] or [facility]ables.   [place]- Preparing For Surgery  Before surgery, you can play an important role. Because skin is not sterile, your skin needs to be as free of germs as possible. You can reduce the number of germs on your skin by washing with CHG (chlorahexidine gluconate) Soap before surgery.  CHG is an antiseptic cleaner which kills germs and bonds with the skin to continue killing germs even after washing.    Oral Hygiene is also important to reduce your risk of infection.  Remember - BRUSH YOUR TEETH THE MORNING OF SURGERY WITH YOUR REGULAR  TOOTHPASTE  Please do not use if you have an allergy to CHG or antibacterial soaps. If your skin becomes reddened/irritated stop using the CHG.  Do not shave (including legs and underarms) for at least 48 hours prior to first CHG shower. It is OK to shave your face.  Please follow these instructions carefully.   1. Shower the NIGHT BEFORE SURGERY and the MORNING OF SURGERY with CHG.   2. If you chose to wash your hair, wash your hair first as usual with your normal shampoo.  3. After you shampoo, rinse your hair and body thoroughly to remove the shampoo.  4. Use CHG as you would any other liquid soap. You can apply CHG directly to the skin and wash gently with a scrungie or a clean washcloth.   5. Apply the CHG Soap to your body ONLY FROM THE NECK DOWN.  Do not use on open wounds or open sores. Avoid contact with your eyes, ears, mouth and genitals (private parts). Wash Face and genitals (private parts)  with your normal soap.  6. Wash thoroughly, paying special attention to the area where your surgery will be performed.  7. Thoroughly rinse your body with warm water from the neck down.  8. DO NOT shower/wash with your normal soap after using and rinsing off the CHG Soap.  9. Pat yourself dry with a CLEAN TOWEL.  10. Wear CLEAN PAJAMAS to bed the night before surgery, wear  comfortable clothes the morning of surgery  11. Place CLEAN SHEETS on your bed the night of your first shower and DO NOT SLEEP WITH PETS.   Day of Surgery:  Do not apply any deodorants/lotions.  Please wear clean clothes to the hospital/surgery center.   Remember to brush your teeth WITH YOUR REGULAR TOOTHPASTE.   Contacts, dentures or bridgework may not be worn into surgery.  Leave your suitcase in the car.  After surgery it may be brought to your room.  For patients admitted to the hospital, discharge time will be determined by your treatment team.  Patients discharged the day of surgery will not be  allowed to drive home.    Please read over the following fact sheets that you were given. Coughing and Deep Breathing and Surgical Site Infection Prevention

## 2019-02-10 ENCOUNTER — Other Ambulatory Visit: Payer: Self-pay

## 2019-02-10 ENCOUNTER — Encounter (HOSPITAL_COMMUNITY)
Admission: RE | Admit: 2019-02-10 | Discharge: 2019-02-10 | Disposition: A | Discharge status: Home / Self Care | Payer: Medicare Other | Source: Ambulatory Visit | Attending: Cardiothoracic Surgery | Admitting: Cardiothoracic Surgery

## 2019-02-10 ENCOUNTER — Encounter (HOSPITAL_COMMUNITY): Payer: Self-pay

## 2019-02-10 DIAGNOSIS — Z01818 Encounter for other preprocedural examination: Secondary | ICD-10-CM | POA: Insufficient documentation

## 2019-02-10 DIAGNOSIS — R918 Other nonspecific abnormal finding of lung field: Secondary | ICD-10-CM | POA: Diagnosis not present

## 2019-02-10 HISTORY — DX: Sleep apnea, unspecified: G47.30

## 2019-02-10 HISTORY — DX: Cardiac murmur, unspecified: R01.1

## 2019-02-10 HISTORY — DX: Pneumonia, unspecified organism: J18.9

## 2019-02-10 LAB — APTT: aPTT: 29 seconds (ref 24–36)

## 2019-02-10 LAB — COMPREHENSIVE METABOLIC PANEL
ALT: 22 U/L (ref 0–44)
AST: 24 U/L (ref 15–41)
Albumin: 4 g/dL (ref 3.5–5.0)
Alkaline Phosphatase: 105 U/L (ref 38–126)
Anion gap: 9 (ref 5–15)
BUN: 10 mg/dL (ref 8–23)
CO2: 25 mmol/L (ref 22–32)
Calcium: 9.8 mg/dL (ref 8.9–10.3)
Chloride: 103 mmol/L (ref 98–111)
Creatinine, Ser: 0.7 mg/dL (ref 0.44–1.00)
GFR calc Af Amer: >60 mL/min (ref >60)
GFR calc non Af Amer: >60 mL/min (ref >60)
Glucose, Bld: 107 mg/dL — ABNORMAL HIGH (ref 70–99)
Potassium: 4.6 mmol/L (ref 3.5–5.1)
Sodium: 137 mmol/L (ref 135–145)
Total Bilirubin: 0.5 mg/dL (ref 0.3–1.2)
Total Protein: 7.1 g/dL (ref 6.5–8.1)

## 2019-02-10 LAB — TYPE AND SCREEN
ABO/RH(D): O POS
Antibody Screen: NEGATIVE

## 2019-02-10 LAB — SURGICAL PCR SCREEN
MRSA, PCR: NEGATIVE
Staphylococcus aureus: POSITIVE — AB

## 2019-02-10 LAB — CBC
HCT: 36.5 % (ref 36.0–46.0)
Hemoglobin: 11.9 g/dL — ABNORMAL LOW (ref 12.0–15.0)
MCH: 29.4 pg (ref 26.0–34.0)
MCHC: 32.6 g/dL (ref 30.0–36.0)
MCV: 90.1 fL (ref 80.0–100.0)
Platelets: 247 10*3/uL (ref 150–400)
RBC: 4.05 MIL/uL (ref 3.87–5.11)
RDW: 13.7 % (ref 11.5–15.5)
WBC: 4.1 10*3/uL (ref 4.0–10.5)
nRBC: 0 % (ref 0.0–0.2)

## 2019-02-10 LAB — PROTIME-INR
INR: 1 (ref 0.8–1.2)
Prothrombin Time: 12.9 seconds (ref 11.4–15.2)

## 2019-02-10 LAB — ABO/RH: ABO/RH(D): O POS

## 2019-02-10 MED ORDER — CEFAZOLIN SODIUM-DEXTROSE 2-4 GM/100ML-% IV SOLN: 2.0000 g | INTRAVENOUS | Status: DC

## 2019-02-10 NOTE — Progress Notes (Signed)
PCP - Dr. Briscoe Deutscher Cardiologist - Dr Daneen Schick  Chest x-ray - DOS EKG - 02/10/19 Stress Test - 2016 ECHO - 2016 Cardiac Cath - denies  Sleep Study - 2016 CPAP - uses QHS- does not know pressure setting   Aspirin Instructions: patient to follow ASA instructions per surgeon. Patient instructed to hold all NSAID's, herbal medications, fish oil and vitamins 7 days prior to surgery.   Anesthesia review: EKG review, cardiac history  Patient denies shortness of breath, fever, cough and chest pain at PAT appointment   Patient verbalized understanding of instructions that were given to them at the PAT appointment. Patient was also instructed that they will need to review over the PAT instructions again at home before surgery.

## 2019-02-10 NOTE — Progress Notes (Signed)
PCR + for Staph, Rx for Mupirocin [facility]. Patient aware.

## 2019-02-11 NOTE — Progress Notes (Signed)
Anesthesia Chart Review:  Case:  [id number] Date/Time:  02/14/19 0715   Procedures:      PARASTERNAL EXPLORATION (Right )     LUNG BIOPSY (N/A )   Anesthesia type:  General   Pre-op diagnosis:  LUNG MASS   Location:  [facility] OR ROOM 36 / [facility]   Surgeon:  [name], MD      DISCUSSION: Patient is a 73 year old female scheduled for the above procedure. She was recently diagnosed with a right thoracic mass following treatment for pneumonia. CT guided needle biopsy showed necrotic tissue, so surgical biopsy recommended.  History includes never smoker, post-operative N/V, hypercholesterolemia, HTN, murmur/MVP (trivial AR, but MVP not mentioned on 11/2014 echo), left breast cancer (Paget's disease, s/p left mastectomy 2004, GERD, fibromyalgia, "non-specific" autoimmune disorder, glaucoma, OSA (CPAP use), NASH,   Dr. Servando Snare classified her Zubrod Score as 0 (normal activity, no symptoms).  She denied SOB, cough, fever, and chest pain at PAT. If no acute changes then I anticipate that she can proceed as planned.   VS: BP 126/70   Pulse 85   Temp (!) 36.3 C   Resp 20   Ht 5' 3"  (1.6 m)   Wt 72.7 kg   SpO2 100%   BMI 28.40 kg/m    PROVIDERS: Briscoe Deutscher, DO is PCP Curt Bears, MD is HEM-ONC Marcial Pacas, MD is neurologist - She is not followed by cardiology, but was seen by Jenkins Rouge, MD and last by Daneen Schick, MD following hospitalization for chest pain in the setting of hypokalemia. She had a low risk stress test, although "there were stress-induced EKG changes that represent a false positive response to myocardial stress." There was no evidence of ischemia or scar on imaging. As of 01/29/15, Dr. Tamala Julian recommended PRN cardiology follow-up.   LABS: Labs reviewed: Acceptable for surgery. (all labs ordered are listed, but only abnormal results are displayed)  Labs Reviewed  SURGICAL PCR SCREEN - Abnormal; Notable for the following components:      Result Value   Staphylococcus aureus POSITIVE (*)    All other components within normal limits  CBC - Abnormal; Notable for the following components:   Hemoglobin 11.9 (*)    All other components within normal limits  COMPREHENSIVE METABOLIC PANEL - Abnormal; Notable for the following components:   Glucose, Bld 107 (*)    All other components within normal limits  APTT  PROTIME-INR  TYPE AND SCREEN  ABO/RH    IMAGES: CXR for day of surgery.  PET Scan 01/18/19: IMPRESSION: 1. Right thoracic mass with pleural, pericardial, and likely mediastinal components. The lobular anterior pleural component is likely centrally necrotic given the central photopenia, and overall mass has a maximum SUV of 6.0. Possibilities might include thymic malignancy with local invasion, lymphoma, lung cancer with local spread, metastatic disease, or less likely mesothelioma. Tissue diagnosis is likely warranted. 2.  Aortic Atherosclerosis (ICD10-I70.0).  Coronary atherosclerosis. 3. Gastric diverticulum.  CT Chest 12/29/18: IMPRESSION: 1. Large mass within the anteromedial right midlung involving the right upper lobe and right middle lobe. The mass appears to extend into the anterior chest wall, mediastinum and pericardium. This is highly suspicious for neoplastic process. Further investigation with PET-CT is recommended.   EKG: 4/34/290: Normal sinus rhythm Possible Left atrial enlargement Borderline ECG No significant change since last tracing Confirmed by Daneen Schick [phone]) on 02/10/2019 10:06:35 AM   CV: Carotid US 12/02/14: Summary: Findings suggest 1-39% internal carotid artery stenosis bilaterally. Vertebral  arteries are patent with antegrade flow.  Echo 12/01/14: Study Conclusions - Left ventricle: The cavity size was normal. Wall thickness was normal. Systolic function was vigorous. The estimated ejection fraction was in the range of 65% to 70%. Wall motion was normal; there were no  regional wall motion abnormalities. Left ventricular diastolic function parameters were normal. - Aortic valve: There was trivial regurgitation. - Mitral valve: Calcified annulus.  Nuclear stress test 11/30/14: IMPRESSION: 1. Low risk pharmacologic nuclear study with normal perfusion at stress and at rest consistent with no prior scar and no ischemia. 2. Ischemic ECG changes during Lexiscan infusion, however normal perfusion images. 3.  Hyperdynamic LVEF, no regional wall motion abnormalities.   Past Medical History:  Diagnosis Date  . Anxiety   . Arthritis    "joints" (11/29/2014)  . Autoimmune disorder ([place])    "non-specific"  . Cancer of left breast ([place])   . Depression   . Fibromyalgia    "some; not chronic" (11/29/2014)  . GERD (gastroesophageal reflux disease)   . Glaucoma of both eyes   . Headache    "weekly" (11/29/2014)  . Heart murmur   . Hypercholesteremia   . Hypertension   . IBS (irritable bowel syndrome)   . Mitral valve prolapse   . NASH (nonalcoholic steatohepatitis)   . Osteoarthritis   . Pneumonia   . PONV (postoperative nausea and vomiting)   . Sleep apnea     Past Surgical History:  Procedure Laterality Date  . ABDOMINAL HYSTERECTOMY  1980  . APPENDECTOMY  1953  . BREAST BIOPSY Left   . BREAST LUMPECTOMY Left   . CATARACT EXTRACTION, BILATERAL  2018  . MASTECTOMY Left ~ 2009    MEDICATIONS: . Ascorbic Acid (VITAMIN C) 1000 MG tablet  . aspirin EC 81 MG tablet  . atorvastatin (LIPITOR) 40 MG tablet  . Cholecalciferol 1.25 MG (50000 UT) TABS  . cycloSPORINE (RESTASIS) 0.05 % ophthalmic emulsion  . gabapentin (NEURONTIN) 300 MG capsule  . irbesartan (AVAPRO) 150 MG tablet  . PARoxetine (PAXIL) 20 MG tablet  . senna-docusate (PERI-COLACE) 8.6-50 MG tablet  . SUMAtriptan (IMITREX) 100 MG tablet  . vitamin E 400 UNIT capsule   No current facility-administered medications for this encounter.   She was advised to follow surgeon's  instructions regarding perioperiative ASA.   Myra Gianotti, PA-C Surgical Short Stay/Anesthesiology [facility] Phone [phone] [facility] Phone [phone] 02/11/2019 9:24 AM

## 2019-02-11 NOTE — Anesthesia Preprocedure Evaluation (Addendum)
Anesthesia Evaluation  Patient identified by MRN, date of birth, ID band Patient awake    Reviewed: Allergy & Precautions, NPO status , Patient's Chart, lab work & pertinent test results  History of Anesthesia Complications (+) PONV  Airway Mallampati: I  TM Distance: >3 FB Neck ROM: Full    Dental  (+) Teeth Intact, Dental Advisory Given   Pulmonary sleep apnea ,    breath sounds clear to auscultation       Cardiovascular hypertension, Pt. on medications + Valvular Problems/Murmurs  Rhythm:Regular Rate:Normal     Neuro/Psych  Headaches, Anxiety Depression    GI/Hepatic GERD  ,(+) Hepatitis -, Unspecified  Endo/Other  negative endocrine ROS  Renal/GU negative Renal ROS     Musculoskeletal  (+) Arthritis , Fibromyalgia -  Abdominal Normal abdominal exam  (+)   Peds  Hematology   Anesthesia Other Findings   Reproductive/Obstetrics                           Lab Results  Component Value Date   WBC 4.1 02/10/2019   HGB 11.9 (L) 02/10/2019   HCT 36.5 02/10/2019   MCV 90.1 02/10/2019   PLT 247 02/10/2019   Lab Results  Component Value Date   CREATININE 0.70 02/10/2019   BUN 10 02/10/2019   NA 137 02/10/2019   K 4.6 02/10/2019   CL 103 02/10/2019   CO2 25 02/10/2019   Lab Results  Component Value Date   INR 1.0 02/10/2019   INR 0.9 01/31/2019   EKG: normal sinus rhythm.  Echo: - Left ventricle: The cavity size was normal. Wall thickness was normal. Systolic function was vigorous. The estimated ejection fraction was in the range of 65% to 70%. Wall motion was normal; there were no regional wall motion abnormalities. Left ventricular diastolic function parameters were normal. - Aortic valve: There was trivial regurgitation. - Mitral valve: Calcified annulus.   Anesthesia Physical Anesthesia Plan  ASA: II  Anesthesia Plan: General   Post-op Pain Management:     Induction: Intravenous  PONV Risk Score and Plan: 4 or greater and Ondansetron, Dexamethasone and Treatment may vary due to age or medical condition  Airway Management Planned: Double Lumen EBT  Additional Equipment: None  Intra-op Plan:   Post-operative Plan: Extubation in OR  Informed Consent: I have reviewed the patients History and Physical, chart, labs and discussed the procedure including the risks, benefits and alternatives for the proposed anesthesia with the patient or authorized representative who has indicated his/her understanding and acceptance.     Dental advisory given  Plan Discussed with: CRNA  Anesthesia Plan Comments: (PAT note written 02/11/2019 by Myra Gianotti, PA-C.  Possible DLT.  )     Anesthesia Quick Evaluation

## 2019-02-14 ENCOUNTER — Inpatient Hospital Stay (HOSPITAL_COMMUNITY): Payer: Medicare Other

## 2019-02-14 ENCOUNTER — Telehealth: Payer: Self-pay | Admitting: Cardiothoracic Surgery

## 2019-02-14 ENCOUNTER — Encounter (HOSPITAL_COMMUNITY)
Admission: RE | Disposition: A | Discharge status: Home / Self Care | Payer: Self-pay | Source: Home / Self Care | Attending: Cardiothoracic Surgery

## 2019-02-14 ENCOUNTER — Encounter (HOSPITAL_COMMUNITY): Payer: Self-pay | Admitting: *Deleted

## 2019-02-14 ENCOUNTER — Inpatient Hospital Stay (HOSPITAL_COMMUNITY): Payer: Medicare Other | Admitting: Vascular Surgery

## 2019-02-14 ENCOUNTER — Inpatient Hospital Stay (HOSPITAL_COMMUNITY): Payer: Medicare Other | Admitting: Certified Registered Nurse Anesthetist

## 2019-02-14 ENCOUNTER — Inpatient Hospital Stay (HOSPITAL_COMMUNITY)
Admission: RE | Admit: 2019-02-14 | Discharge: 2019-02-16 | DRG: 835 | Disposition: A | Discharge status: Home / Self Care | Payer: Medicare Other | Attending: Cardiothoracic Surgery | Admitting: Cardiothoracic Surgery

## 2019-02-14 ENCOUNTER — Other Ambulatory Visit: Payer: Self-pay

## 2019-02-14 DIAGNOSIS — K219 Gastro-esophageal reflux disease without esophagitis: Secondary | ICD-10-CM | POA: Diagnosis not present

## 2019-02-14 DIAGNOSIS — Z90710 Acquired absence of both cervix and uterus: Secondary | ICD-10-CM

## 2019-02-14 DIAGNOSIS — Z853 Personal history of malignant neoplasm of breast: Secondary | ICD-10-CM

## 2019-02-14 DIAGNOSIS — Z79899 Other long term (current) drug therapy: Secondary | ICD-10-CM | POA: Diagnosis not present

## 2019-02-14 DIAGNOSIS — K7581 Nonalcoholic steatohepatitis (NASH): Secondary | ICD-10-CM | POA: Diagnosis present

## 2019-02-14 DIAGNOSIS — R222 Localized swelling, mass and lump, trunk: Secondary | ICD-10-CM | POA: Diagnosis not present

## 2019-02-14 DIAGNOSIS — F329 Major depressive disorder, single episode, unspecified: Secondary | ICD-10-CM | POA: Diagnosis present

## 2019-02-14 DIAGNOSIS — E7800 Pure hypercholesterolemia, unspecified: Secondary | ICD-10-CM | POA: Diagnosis present

## 2019-02-14 DIAGNOSIS — I700 Atherosclerosis of aorta: Secondary | ICD-10-CM | POA: Diagnosis present

## 2019-02-14 DIAGNOSIS — C9100 Acute lymphoblastic leukemia not having achieved remission: Secondary | ICD-10-CM | POA: Diagnosis present

## 2019-02-14 DIAGNOSIS — K589 Irritable bowel syndrome without diarrhea: Secondary | ICD-10-CM | POA: Diagnosis not present

## 2019-02-14 DIAGNOSIS — R011 Cardiac murmur, unspecified: Secondary | ICD-10-CM | POA: Diagnosis not present

## 2019-02-14 DIAGNOSIS — M797 Fibromyalgia: Secondary | ICD-10-CM | POA: Diagnosis not present

## 2019-02-14 DIAGNOSIS — J939 Pneumothorax, unspecified: Secondary | ICD-10-CM

## 2019-02-14 DIAGNOSIS — E559 Vitamin D deficiency, unspecified: Secondary | ICD-10-CM | POA: Diagnosis not present

## 2019-02-14 DIAGNOSIS — I10 Essential (primary) hypertension: Secondary | ICD-10-CM | POA: Diagnosis not present

## 2019-02-14 DIAGNOSIS — Z801 Family history of malignant neoplasm of trachea, bronchus and lung: Secondary | ICD-10-CM

## 2019-02-14 DIAGNOSIS — I341 Nonrheumatic mitral (valve) prolapse: Secondary | ICD-10-CM | POA: Diagnosis not present

## 2019-02-14 DIAGNOSIS — J479 Bronchiectasis, uncomplicated: Secondary | ICD-10-CM | POA: Diagnosis present

## 2019-02-14 DIAGNOSIS — G4730 Sleep apnea, unspecified: Secondary | ICD-10-CM | POA: Diagnosis present

## 2019-02-14 DIAGNOSIS — Z9841 Cataract extraction status, right eye: Secondary | ICD-10-CM

## 2019-02-14 DIAGNOSIS — R079 Chest pain, unspecified: Secondary | ICD-10-CM | POA: Diagnosis not present

## 2019-02-14 DIAGNOSIS — Z9842 Cataract extraction status, left eye: Secondary | ICD-10-CM

## 2019-02-14 DIAGNOSIS — Z9012 Acquired absence of left breast and nipple: Secondary | ICD-10-CM | POA: Diagnosis not present

## 2019-02-14 DIAGNOSIS — R918 Other nonspecific abnormal finding of lung field: Secondary | ICD-10-CM | POA: Diagnosis not present

## 2019-02-14 DIAGNOSIS — R911 Solitary pulmonary nodule: Secondary | ICD-10-CM | POA: Diagnosis not present

## 2019-02-14 DIAGNOSIS — F419 Anxiety disorder, unspecified: Secondary | ICD-10-CM | POA: Diagnosis present

## 2019-02-14 DIAGNOSIS — C8350 Lymphoblastic (diffuse) lymphoma, unspecified site: Secondary | ICD-10-CM | POA: Diagnosis not present

## 2019-02-14 DIAGNOSIS — J9859 Other diseases of mediastinum, not elsewhere classified: Secondary | ICD-10-CM | POA: Diagnosis present

## 2019-02-14 DIAGNOSIS — J9811 Atelectasis: Secondary | ICD-10-CM | POA: Diagnosis present

## 2019-02-14 DIAGNOSIS — Z7982 Long term (current) use of aspirin: Secondary | ICD-10-CM | POA: Diagnosis not present

## 2019-02-14 DIAGNOSIS — H409 Unspecified glaucoma: Secondary | ICD-10-CM | POA: Diagnosis present

## 2019-02-14 DIAGNOSIS — D62 Acute posthemorrhagic anemia: Secondary | ICD-10-CM | POA: Diagnosis not present

## 2019-02-14 HISTORY — PX: PARASTERNAL EXPLORATION: SHX6388

## 2019-02-14 LAB — ACID FAST SMEAR (AFB, MYCOBACTERIA): Acid Fast Smear: NEGATIVE

## 2019-02-14 LAB — GLUCOSE, CAPILLARY
Glucose-Capillary: 118 mg/dL — ABNORMAL HIGH (ref 70–99)
Glucose-Capillary: 171 mg/dL — ABNORMAL HIGH (ref 70–99)

## 2019-02-14 SURGERY — EXPLORATION, PARASTERNAL REGION
Anesthesia: General | Site: Chest | Laterality: Right

## 2019-02-14 MED ORDER — ROCURONIUM BROMIDE 10 MG/ML (PF) SYRINGE
PREFILLED_SYRINGE | INTRAVENOUS | Status: DC | PRN
  Administered 2019-02-14: 30 mg via INTRAVENOUS
  Administered 2019-02-14 (×2): 10 mg via INTRAVENOUS

## 2019-02-14 MED ORDER — OXYCODONE HCL 5 MG PO TABS
5.0000 mg | ORAL_TABLET | ORAL | Status: DC | PRN
  Administered 2019-02-14 – 2019-02-15 (×2): 5 mg via ORAL
  Filled 2019-02-14 (×2): qty 1

## 2019-02-14 MED ORDER — IRBESARTAN 150 MG PO TABS
75.0000 mg | ORAL_TABLET | Freq: Every day | ORAL | Status: DC
  Administered 2019-02-15 – 2019-02-16 (×2): 75 mg via ORAL
  Filled 2019-02-14 (×2): qty 1

## 2019-02-14 MED ORDER — ONDANSETRON HCL 4 MG/2ML IJ SOLN
4.0000 mg | Freq: Four times a day (QID) | INTRAMUSCULAR | Status: DC | PRN
  Administered 2019-02-14: 4 mg via INTRAVENOUS
  Filled 2019-02-14: qty 2

## 2019-02-14 MED ORDER — DIPHENHYDRAMINE HCL 50 MG/ML IJ SOLN: 12.5000 mg | Freq: Four times a day (QID) | INTRAMUSCULAR | Status: DC | PRN

## 2019-02-14 MED ORDER — HYDRALAZINE HCL 20 MG/ML IJ SOLN
10.0000 mg | Freq: Four times a day (QID) | INTRAMUSCULAR | Status: DC | PRN
  Administered 2019-02-14: 10 mg via INTRAVENOUS
  Filled 2019-02-14: qty 1

## 2019-02-14 MED ORDER — SUFENTANIL CITRATE 50 MCG/ML IV SOLN
INTRAVENOUS | Status: AC
  Filled 2019-02-14: qty 1

## 2019-02-14 MED ORDER — ATORVASTATIN CALCIUM 40 MG PO TABS
40.0000 mg | ORAL_TABLET | Freq: Every day | ORAL | Status: DC
  Administered 2019-02-14 – 2019-02-15 (×2): 40 mg via ORAL
  Filled 2019-02-14 (×2): qty 1

## 2019-02-14 MED ORDER — ROCURONIUM BROMIDE 50 MG/5ML IV SOSY
PREFILLED_SYRINGE | INTRAVENOUS | Status: AC
  Filled 2019-02-14: qty 5

## 2019-02-14 MED ORDER — SENNOSIDES-DOCUSATE SODIUM 8.6-50 MG PO TABS
1.0000 | ORAL_TABLET | Freq: Every day | ORAL | Status: DC
  Administered 2019-02-14: 1 via ORAL
  Filled 2019-02-14 (×2): qty 1

## 2019-02-14 MED ORDER — SODIUM CHLORIDE 0.9% FLUSH: 9.0000 mL | INTRAVENOUS | Status: DC | PRN

## 2019-02-14 MED ORDER — ACETAMINOPHEN 500 MG PO TABS
1000.0000 mg | ORAL_TABLET | Freq: Four times a day (QID) | ORAL | Status: DC
  Administered 2019-02-14 – 2019-02-16 (×6): 1000 mg via ORAL
  Filled 2019-02-14 (×6): qty 2

## 2019-02-14 MED ORDER — ACETAMINOPHEN 160 MG/5ML PO SOLN
1000.0000 mg | Freq: Four times a day (QID) | ORAL | Status: DC
  Administered 2019-02-15: 1000 mg via ORAL
  Filled 2019-02-14: qty 40.6

## 2019-02-14 MED ORDER — PAROXETINE HCL 30 MG PO TABS
60.0000 mg | ORAL_TABLET | ORAL | Status: DC
  Administered 2019-02-15 – 2019-02-16 (×2): 60 mg via ORAL
  Filled 2019-02-14 (×2): qty 2

## 2019-02-14 MED ORDER — CEFAZOLIN SODIUM-DEXTROSE 2-4 GM/100ML-% IV SOLN
INTRAVENOUS | Status: AC
  Filled 2019-02-14: qty 100

## 2019-02-14 MED ORDER — BISACODYL 5 MG PO TBEC
10.0000 mg | DELAYED_RELEASE_TABLET | Freq: Every day | ORAL | Status: DC
  Administered 2019-02-15: 10 mg via ORAL
  Filled 2019-02-14 (×2): qty 2

## 2019-02-14 MED ORDER — GABAPENTIN 300 MG PO CAPS
300.0000 mg | ORAL_CAPSULE | Freq: Every day | ORAL | Status: DC
  Administered 2019-02-14 – 2019-02-15 (×2): 300 mg via ORAL
  Filled 2019-02-14 (×2): qty 1

## 2019-02-14 MED ORDER — TRAMADOL HCL 50 MG PO TABS
50.0000 mg | ORAL_TABLET | Freq: Four times a day (QID) | ORAL | Status: DC | PRN
  Administered 2019-02-14 – 2019-02-15 (×3): 50 mg via ORAL
  Filled 2019-02-14 (×3): qty 1

## 2019-02-14 MED ORDER — MIDAZOLAM HCL 5 MG/5ML IJ SOLN
INTRAMUSCULAR | Status: DC | PRN
  Administered 2019-02-14: 2 mg via INTRAVENOUS

## 2019-02-14 MED ORDER — SUCCINYLCHOLINE CHLORIDE 200 MG/10ML IV SOSY
PREFILLED_SYRINGE | INTRAVENOUS | Status: DC | PRN
  Administered 2019-02-14: 100 mg via INTRAVENOUS

## 2019-02-14 MED ORDER — NALOXONE HCL 0.4 MG/ML IJ SOLN: 0.4000 mg | INTRAMUSCULAR | Status: DC | PRN

## 2019-02-14 MED ORDER — PHENYLEPHRINE 40 MCG/ML (10ML) SYRINGE FOR IV PUSH (FOR BLOOD PRESSURE SUPPORT)
PREFILLED_SYRINGE | INTRAVENOUS | Status: DC | PRN
  Administered 2019-02-14 (×2): 80 ug via INTRAVENOUS

## 2019-02-14 MED ORDER — FENTANYL 40 MCG/ML IV SOLN
INTRAVENOUS | Status: DC
  Administered 2019-02-14: 1000 ug via INTRAVENOUS
  Administered 2019-02-14: 70 ug via INTRAVENOUS
  Administered 2019-02-15 (×3): 0 ug via INTRAVENOUS
  Administered 2019-02-15: 10 ug via INTRAVENOUS
  Administered 2019-02-15: 30 ug via INTRAVENOUS
  Filled 2019-02-14: qty 1000

## 2019-02-14 MED ORDER — ONDANSETRON HCL 4 MG/2ML IJ SOLN
INTRAMUSCULAR | Status: DC | PRN
  Administered 2019-02-14: 4 mg via INTRAVENOUS

## 2019-02-14 MED ORDER — INSULIN ASPART 100 UNIT/ML ~~LOC~~ SOLN
0.0000 [IU] | Freq: Four times a day (QID) | SUBCUTANEOUS | Status: DC
  Administered 2019-02-14: 4 [IU] via SUBCUTANEOUS

## 2019-02-14 MED ORDER — CYCLOSPORINE 0.05 % OP EMUL
1.0000 [drp] | Freq: Two times a day (BID) | OPHTHALMIC | Status: DC
  Administered 2019-02-14 – 2019-02-16 (×4): 1 [drp] via OPHTHALMIC
  Filled 2019-02-14 (×4): qty 30

## 2019-02-14 MED ORDER — FENTANYL CITRATE (PF) 100 MCG/2ML IJ SOLN
25.0000 ug | INTRAMUSCULAR | Status: DC | PRN
  Administered 2019-02-14: 50 ug via INTRAVENOUS
  Administered 2019-02-14 (×2): 25 ug via INTRAVENOUS

## 2019-02-14 MED ORDER — ENOXAPARIN SODIUM 40 MG/0.4ML ~~LOC~~ SOLN
40.0000 mg | SUBCUTANEOUS | Status: DC
  Administered 2019-02-14 – 2019-02-15 (×2): 40 mg via SUBCUTANEOUS
  Filled 2019-02-14 (×2): qty 0.4

## 2019-02-14 MED ORDER — LIDOCAINE 2% (20 MG/ML) 5 ML SYRINGE
INTRAMUSCULAR | Status: AC
  Filled 2019-02-14: qty 5

## 2019-02-14 MED ORDER — LACTATED RINGERS IV SOLN
INTRAVENOUS | Status: DC | PRN
  Administered 2019-02-14: 07:00:00 via INTRAVENOUS

## 2019-02-14 MED ORDER — POTASSIUM CHLORIDE 10 MEQ/50ML IV SOLN: 10.0000 meq | Freq: Every day | INTRAVENOUS | Status: DC | PRN

## 2019-02-14 MED ORDER — MIDAZOLAM HCL 2 MG/2ML IJ SOLN
INTRAMUSCULAR | Status: AC
  Filled 2019-02-14: qty 2

## 2019-02-14 MED ORDER — SUFENTANIL CITRATE 50 MCG/ML IV SOLN
INTRAVENOUS | Status: DC | PRN
  Administered 2019-02-14: 5 ug via INTRAVENOUS
  Administered 2019-02-14: 10 ug via INTRAVENOUS

## 2019-02-14 MED ORDER — SODIUM CHLORIDE 0.9 % IV SOLN
INTRAVENOUS | Status: DC
  Administered 2019-02-14 – 2019-02-15 (×2): via INTRAVENOUS

## 2019-02-14 MED ORDER — PHENYLEPHRINE 40 MCG/ML (10ML) SYRINGE FOR IV PUSH (FOR BLOOD PRESSURE SUPPORT)
PREFILLED_SYRINGE | INTRAVENOUS | Status: AC
  Filled 2019-02-14: qty 10

## 2019-02-14 MED ORDER — PROPOFOL 10 MG/ML IV BOLUS
INTRAVENOUS | Status: DC | PRN
  Administered 2019-02-14: 100 mg via INTRAVENOUS

## 2019-02-14 MED ORDER — MUPIROCIN 2 % EX OINT
1.0000 "application " | TOPICAL_OINTMENT | Freq: Two times a day (BID) | CUTANEOUS | Status: AC
  Administered 2019-02-14: 1 via TOPICAL

## 2019-02-14 MED ORDER — DIPHENHYDRAMINE HCL 12.5 MG/5ML PO ELIX
12.5000 mg | ORAL_SOLUTION | Freq: Four times a day (QID) | ORAL | Status: DC | PRN
  Filled 2019-02-14: qty 5

## 2019-02-14 MED ORDER — CEFAZOLIN SODIUM-DEXTROSE 2-4 GM/100ML-% IV SOLN
2.0000 g | Freq: Once | INTRAVENOUS | Status: AC
  Administered 2019-02-14: 08:00:00 2 g via INTRAVENOUS

## 2019-02-14 MED ORDER — ASPIRIN EC 81 MG PO TBEC
81.0000 mg | DELAYED_RELEASE_TABLET | Freq: Every day | ORAL | Status: DC
  Administered 2019-02-15 – 2019-02-16 (×2): 81 mg via ORAL
  Filled 2019-02-14 (×2): qty 1

## 2019-02-14 MED ORDER — DEXAMETHASONE SODIUM PHOSPHATE 10 MG/ML IJ SOLN
INTRAMUSCULAR | Status: DC | PRN
  Administered 2019-02-14: 5 mg via INTRAVENOUS

## 2019-02-14 MED ORDER — SUCCINYLCHOLINE CHLORIDE 200 MG/10ML IV SOSY
PREFILLED_SYRINGE | INTRAVENOUS | Status: AC
  Filled 2019-02-14: qty 10

## 2019-02-14 MED ORDER — SUGAMMADEX SODIUM 200 MG/2ML IV SOLN
INTRAVENOUS | Status: DC | PRN
  Administered 2019-02-14: 100 mg via INTRAVENOUS

## 2019-02-14 MED ORDER — LIDOCAINE 2% (20 MG/ML) 5 ML SYRINGE
INTRAMUSCULAR | Status: DC | PRN
  Administered 2019-02-14: 50 mg via INTRAVENOUS

## 2019-02-14 MED ORDER — SODIUM CHLORIDE (PF) 0.9 % IJ SOLN
INTRAMUSCULAR | Status: AC
  Filled 2019-02-14: qty 10

## 2019-02-14 MED ORDER — DEXAMETHASONE SODIUM PHOSPHATE 10 MG/ML IJ SOLN
INTRAMUSCULAR | Status: AC
  Filled 2019-02-14: qty 1

## 2019-02-14 MED ORDER — ONDANSETRON HCL 4 MG/2ML IJ SOLN
INTRAMUSCULAR | Status: AC
  Filled 2019-02-14: qty 2

## 2019-02-14 MED ORDER — 0.9 % SODIUM CHLORIDE (POUR BTL) OPTIME
TOPICAL | Status: DC | PRN
  Administered 2019-02-14: 08:00:00 1000 mL

## 2019-02-14 MED ORDER — FENTANYL CITRATE (PF) 100 MCG/2ML IJ SOLN
INTRAMUSCULAR | Status: AC
  Filled 2019-02-14: qty 2

## 2019-02-14 MED ORDER — PROPOFOL 10 MG/ML IV BOLUS
INTRAVENOUS | Status: AC
  Filled 2019-02-14: qty 20

## 2019-02-14 SURGICAL SUPPLY — 53 items
BIOPATCH RED 1 DISK 7.0 (GAUZE/BANDAGES/DRESSINGS) ×3 IMPLANT
BLADE SURG 10 STRL SS (BLADE) ×3 IMPLANT
CANISTER SUCT 3000ML PPV (MISCELLANEOUS) ×3 IMPLANT
CATH THORACIC 28FR (CATHETERS) IMPLANT
CLIP VESOCCLUDE MED 6/CT (CLIP) ×3 IMPLANT
CONT SPEC 4OZ CLIKSEAL STRL BL (MISCELLANEOUS) ×18 IMPLANT
COVER SURGICAL LIGHT HANDLE (MISCELLANEOUS) ×3 IMPLANT
COVER WAND RF STERILE (DRAPES) ×3 IMPLANT
DERMABOND ADVANCED (GAUZE/BANDAGES/DRESSINGS) ×1
DERMABOND ADVANCED .7 DNX12 (GAUZE/BANDAGES/DRESSINGS) ×2 IMPLANT
DRAIN CHANNEL 15F RND FF W/TCR (WOUND CARE) ×3 IMPLANT
DRAPE CHEST BREAST 15X10 FENES (DRAPES) ×3 IMPLANT
DRAPE LAPAROTOMY T 102X78X121 (DRAPES) ×3 IMPLANT
DRSG AQUACEL AG ADV 3.5X14 (GAUZE/BANDAGES/DRESSINGS) ×3 IMPLANT
ELECT BLADE 4.0 EZ CLEAN MEGAD (MISCELLANEOUS) ×3
ELECT CAUTERY BLADE 6.4 (BLADE) ×3 IMPLANT
ELECT REM PT RETURN 9FT ADLT (ELECTROSURGICAL) ×3
ELECTRODE BLDE 4.0 EZ CLN MEGD (MISCELLANEOUS) ×2 IMPLANT
ELECTRODE REM PT RTRN 9FT ADLT (ELECTROSURGICAL) ×2 IMPLANT
EVACUATOR SILICONE 100CC (DRAIN) ×3 IMPLANT
GAUZE 4X4 16PLY RFD (DISPOSABLE) ×3 IMPLANT
GAUZE SPONGE 2X2 8PLY STRL LF (GAUZE/BANDAGES/DRESSINGS) ×2 IMPLANT
GAUZE SPONGE 4X4 12PLY STRL (GAUZE/BANDAGES/DRESSINGS) ×3 IMPLANT
GLOVE BIO SURGEON STRL SZ 6.5 (GLOVE) ×6 IMPLANT
GOWN STRL REUS W/ TWL LRG LVL3 (GOWN DISPOSABLE) ×2 IMPLANT
GOWN STRL REUS W/TWL LRG LVL3 (GOWN DISPOSABLE) ×1
HEMOSTAT SURGICEL 2X14 (HEMOSTASIS) IMPLANT
KIT BASIN OR (CUSTOM PROCEDURE TRAY) ×3 IMPLANT
KIT TURNOVER KIT B (KITS) ×3 IMPLANT
NS IRRIG 1000ML POUR BTL (IV SOLUTION) ×3 IMPLANT
PACK SURGICAL SETUP 50X90 (CUSTOM PROCEDURE TRAY) ×3 IMPLANT
PAD ARMBOARD 7.5X6 YLW CONV (MISCELLANEOUS) ×6 IMPLANT
PENCIL BUTTON HOLSTER BLD 10FT (ELECTRODE) ×3 IMPLANT
SPONGE GAUZE 2X2 STER 10/PKG (GAUZE/BANDAGES/DRESSINGS) ×1
SPONGE INTESTINAL PEANUT (DISPOSABLE) IMPLANT
STAPLER VISISTAT 35W (STAPLE) IMPLANT
SUT ETHILON 3 0 PS 1 (SUTURE) ×3 IMPLANT
SUT SILK 2 0 TIES 10X30 (SUTURE) IMPLANT
SUT VIC AB 2-0 CT1 18 (SUTURE) ×3 IMPLANT
SUT VIC AB 2-0 CT1 27 (SUTURE) ×2
SUT VIC AB 2-0 CT1 TAPERPNT 27 (SUTURE) ×4 IMPLANT
SUT VIC AB 3-0 SH 27 (SUTURE) ×1
SUT VIC AB 3-0 SH 27XBRD (SUTURE) ×2 IMPLANT
SUT VIC AB 3-0 X1 27 (SUTURE) ×3 IMPLANT
SUT VIC AB 4-0 PS2 18 (SUTURE) ×3 IMPLANT
SYR 10ML LL (SYRINGE) ×6 IMPLANT
SYR BULB IRRIGATION 50ML (SYRINGE) ×3 IMPLANT
SYSTEM SAHARA CHEST DRAIN RE-I (WOUND CARE) IMPLANT
TAPE CLOTH SURG 4X10 WHT LF (GAUZE/BANDAGES/DRESSINGS) ×3 IMPLANT
TOWEL GREEN STERILE (TOWEL DISPOSABLE) ×3 IMPLANT
TOWEL GREEN STERILE FF (TOWEL DISPOSABLE) ×3 IMPLANT
TUBE CONNECTING 12X1/4 (SUCTIONS) ×3 IMPLANT
WATER STERILE IRR 1000ML POUR (IV SOLUTION) ×3 IMPLANT

## 2019-02-14 NOTE — Progress Notes (Signed)
Pt came to unit from [facility] . Alert and oriented , with c/o pain to opsite p/s 4, using pca not much relief. o2 via n/.c made comfortable in bed. care continues.

## 2019-02-14 NOTE — Brief Op Note (Addendum)
02/14/2019  9:47 AM  PATIENT:  Autumn Conley  73 y.o. female  PRE-OPERATIVE DIAGNOSIS:  RIGHT PARASTERNAL  MASS  POST-OPERATIVE DIAGNOSIS:  RIGHT PARASTERNAL  MASS  PROCEDURE: RIGHT PARASTERNAL MEDIAL EXPLORATION WITH BIOPIES.   SURGEON:  Surgeon(s) and Role:    [name], MD - Primary  PHYSICIAN ASSISTANT: Lars Pinks PA-C  ANESTHESIA:   general  EBL:  10 mL   BLOOD ADMINISTERED:none  DRAINS: 15 Blake drain    SPECIMEN:  Source of Specimen:  Right parasternal/anterior mediastinal mass  DISPOSITION OF SPECIMEN:  PATHOLOGY, CULTURE, and GRAM STAIN  COUNTS CORRECT:  YES  DICTATION: .Dragon Dictation  PLAN OF CARE: Admit to inpatient   PATIENT DISPOSITION:  PACU - hemodynamically stable.   Delay start of Pharmacological VTE agent (>24hrs) due to surgical blood loss or risk of bleeding: no  Findings, necrotic mass, cultures done, solid portion "lots of small blue cells" touch prep and flow cytometry pending , final path pending

## 2019-02-14 NOTE — Discharge Instructions (Signed)
Discharge Instructions:  1. You may shower, please wash incisions daily with soap and water and keep dry.  If you wish to cover wounds with dressing you may do so but please keep clean and change daily.  No tub baths or swimming until incisions have completely healed.  If your incisions become red or develop any drainage please call our office at [phone]  2. No Driving until cleared by Dr. Everrett Coombe office and you are no longer using narcotic pain medications  3. Fever of 101.5 for at least 24 hours with no source, please contact our office at [phone]  4. Activity- up as tolerated, please walk at least 3 times per day.  Avoid strenuous activity, no lifting, pushing, or pulling with your arms over 8-10 lbs for a minimum of 6 weeks  5. If any questions or concerns arise, please do not hesitate to contact our office at [phone]

## 2019-02-14 NOTE — Telephone Encounter (Signed)
See note, called husband after surgery

## 2019-02-14 NOTE — Anesthesia Procedure Notes (Signed)
Procedure Name: Intubation Date/Time: 02/14/2019 7:43 AM Performed by: Moshe Salisbury, CRNA Pre-anesthesia Checklist: Patient identified, Emergency Drugs available, Suction available and Patient being monitored Patient Re-evaluated:Patient Re-evaluated prior to induction Oxygen Delivery Method: Circle System Utilized Preoxygenation: Pre-oxygenation with 100% oxygen Induction Type: IV induction and Rapid sequence Laryngoscope Size: Mac and 3 Grade View: Grade II Endobronchial tube: Right, Double lumen EBT, EBT position confirmed by auscultation and EBT position confirmed by fiberoptic bronchoscope and 35 Fr Number of attempts: 2 (First attempt with 37 fr EBT unable to pass through glottis, 35 fr EBT placed easily on second attempt) Airway Equipment and Method: Stylet and Fiberoptic brochoscope Placement Confirmation: ETT inserted through vocal cords under direct vision,  positive ETCO2 and breath sounds checked- equal and bilateral Secured at: 29 cm Tube secured with: Tape Dental Injury: Teeth and Oropharynx as per pre-operative assessment

## 2019-02-14 NOTE — H&P (Signed)
[address]       [place] [address]             [phone]                    [facility] Record #[id number] Date of Birth: 03/29/1946  Referring: Curt Bears, MD Primary Care: Briscoe Deutscher, DO Primary Cardiologist: No primary care provider on file.  Chief Complaint:    Chief Complaint  Patient presents with   Lung Mass    Surgical eval, PET scan  and MRI Brain 01/18/19, CT BX 01/31/19, Chest CT 12/29/18    History of Present Illness:    Autumn Conley 73 y.o. female was seen  for evaluation of right lung mass.  Patient is a lifelong non-smoker , she notes that her symptoms began approximately 2 months ago.  She began having upper respiratory symptoms with cough and some sputum production she was treated as an outpatient with 3 rounds of antibiotics she denied any fever or chills denies hemoptysis ultimately a CT scan was performed which demonstrated findings noted a right pleural base mass extending to the mediastinum.  Confirmed on PET scan.  Attempted needle biopsy resulted in necrotic tissue.  The patient is been referred for further diagnostic studies including obtain a tissue diagnosis.  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   CT-guided needle biopsy results   Soft Tissue Needle Core Biopsy, right pleural - NECROTIC TISSUE. SEE NOTE Diagnosis Note The specimen is almost entirely necrotic. This precludes a definitive assessment but the findings are worrisome for a necrotic neoplasm. Dr. Melina Copa has reviewed this case and concurs with the above interpretation. [name] MD  Current Activity/ Functional Status:  Patient is independent with mobility/ambulation, transfers, ADL's, IADL's.   Zubrod Score: At the time of surgery this patients most appropriate activity status/level should be described  as: [x]     0    Normal activity, no symptoms []     1    Restricted in physical strenuous activity but ambulatory, able to do out light work []     2    Ambulatory and capable of self care, unable to do work activities, up and about               >50 % of waking hours                              []     3    Only limited self care, in bed greater than 50% of waking hours []     4    Completely disabled, no self care, confined to bed or chair []     5    Moribund   Past Medical History:  Diagnosis Date   Anxiety    Arthritis    "joints" (11/29/2014)   Autoimmune disorder ([place])    "non-specific"   Cancer of left breast ([place])    Depression    Fibromyalgia    "some; not chronic" (11/29/2014)   GERD (gastroesophageal reflux disease)    Glaucoma of both eyes    Headache    "weekly" (11/29/2014)   Heart murmur    Hypercholesteremia    Hypertension    IBS (irritable bowel syndrome)    Mitral valve prolapse  NASH (nonalcoholic steatohepatitis)    Osteoarthritis    Pneumonia    PONV (postoperative nausea and vomiting)    Sleep apnea     Past Surgical History:  Procedure Laterality Date   ABDOMINAL HYSTERECTOMY  1980   APPENDECTOMY  1953   BREAST BIOPSY Left    BREAST LUMPECTOMY Left    CATARACT EXTRACTION, BILATERAL  2018   MASTECTOMY Left ~ 2009    Family History  Problem Relation Age of Onset   Lung cancer Mother 29   Prostate cancer Brother 51     Social History   Tobacco Use  Smoking Status Never Smoker  Smokeless Tobacco Never Used    Social History   Substance and Sexual Activity  Alcohol Use No     No Known Allergies  Current Facility-Administered Medications  Medication Dose Route Frequency Provider Last Rate Last Dose   ceFAZolin (ANCEF) 2-4 GM/100ML-% IVPB             Pertinent items are noted in HPI.   Review of Systems:     Cardiac Review of Systems: [Y] = yes  or   [ N ] = no   Chest Pain [ n   ]  Resting SOB [ n   ] Exertional SOB  [n  ]  Orthopnea [ n ]   Pedal Edema [ n  ]    Palpitations [n  ] Syncope  [n  ]   Presyncope [ n  ]   General Review of Systems: [Y] = yes [  ]=no Constitional: recent weight change [  ];  Wt loss over the last 3 months [   ] anorexia [  ]; fatigue [ y ]; nausea [  ]; night sweats [  ]; fever [  ]; or chills [  ];           Eye : blurred vision [  ]; diplopia [   ]; vision changes [  ];  Amaurosis fugax[  ]; Resp: cough [  ];  wheezing[  ];  hemoptysis[  ]; shortness of breath[  ]; paroxysmal nocturnal dyspnea[  ]; dyspnea on exertion[  ]; or orthopnea[  ];  GI:  gallstones[  ], vomiting[  ];  dysphagia[  ]; melena[  ];  hematochezia [  ]; heartburn[  ];   Hx of  Colonoscopy[  ]; GU: kidney stones [  ]; hematuria[  ];   dysuria [  ];  nocturia[  ];  history of     obstruction [  ]; urinary frequency [  ]             Skin: rash, swelling[  ];, hair loss[  ];  peripheral edema[  ];  or itching[  ]; Musculosketetal: myalgias[  ];  joint swelling[  ];  joint erythema[  ];  joint pain[  ];  back pain[  ];  Heme/Lymph: bruising[  ];  bleeding[  ];  anemia[  ];  Neuro: TIA[  ];  headaches[y  ];  stroke[  ];  vertigo[  ];  seizures[  ];   paresthesias[ y ];  difficulty walking[  ];  Psych:depression[  ]; anxiety[ y ];  Endocrine: diabetes[  ];  thyroid dysfunction[  ];  Immunizations: Flu up to date [  ]; Pneumococcal up to date [  ];  Other:sleep apnea    PHYSICAL EXAMINATION: BP (!) 148/70    Pulse 78    Temp 98.2 F (36.8 C) (Oral)  Resp 20    Ht 5' 3"  (1.6 m)    Wt 72.7 kg    SpO2 97%    BMI 28.40 kg/m  General appearance: alert and cooperative Head: Normocephalic, without obvious abnormality, atraumatic Neck: no adenopathy, no carotid bruit, no JVD, supple, symmetrical, trachea midline and thyroid not enlarged, symmetric, no tenderness/mass/nodules Lymph nodes: Cervical, supraclavicular, and axillary nodes normal. Resp: clear to auscultation bilaterally Back: symmetric,  no curvature. ROM normal. No CVA tenderness. Cardio: regular rate and rhythm, S1, S2 normal, no murmur, click, rub or gallop GI: soft, non-tender; bowel sounds normal; no masses,  no organomegaly Extremities: extremities normal, atraumatic, no cyanosis or edema Neurologic: Grossly normal Left mastectomy, over the inner aspect of the right breast right parasternal area is bruising related to her needle biopsy  Diagnostic Studies & Laboratory data:     Recent Radiology Findings:  Dg Chest 2 View  Result Date: 02/14/2019 CLINICAL DATA:  Lung mass.  Preop for biopsy. EXAM: CHEST - 2 VIEW COMPARISON:  CT chest 12/29/2018.  Biopsy CT 01/31/2019. FINDINGS: The heart size is normal. Anterior right middle lobe pleural-based lung mass is again noted. Pleural based disease the right apex is again noted. No other focal lesions are evident. There is no edema or effusion. The visualized soft tissues and bony thorax are unremarkable. IMPRESSION: 1. Anterior right middle lobe lung mass again noted. 2. Right apical pleural disease noted. 3. No acute cardiopulmonary disease. Electronically Signed   By: San Morelle M.D.   On: 02/14/2019 06:54    Mr Jeri Cos IR Contrast  Result Date: 01/18/2019 CLINICAL DATA:  Non-small cell lung cancer staging. EXAM: MRI HEAD WITHOUT AND WITH CONTRAST TECHNIQUE: Multiplanar, multiecho pulse sequences of the brain and surrounding structures were obtained without and with intravenous contrast. CONTRAST:  7 mL Gadavist COMPARISON:  03/06/2016 FINDINGS: Brain: There is no evidence of acute infarct, intracranial hemorrhage, mass, midline shift, or extra-axial fluid collection. Generalized cerebral atrophy is mild for age. Patchy T2 hyperintensities in the cerebral white matter and pons have not significantly changed and are nonspecific but compatible with moderate chronic small vessel ischemic disease. No abnormal enhancement is identified. Vascular: Major intracranial vascular flow  voids are preserved. Skull and upper cervical spine: Unremarkable bone marrow signal. Sinuses/Orbits: Bilateral cataract extraction. Minimal scattered mucosal thickening in the paranasal sinuses. Clear mastoid air cells. Other: None. IMPRESSION: 1. No evidence of intracranial metastases. 2. Moderate chronic small vessel ischemic disease. Electronically Signed   By: Logan Bores M.D.   On: 01/18/2019 13:18   Nm Pet Image Initial (pi) Skull Base To Thigh  Result Date: 01/18/2019 CLINICAL DATA:  Initial treatment strategy for lung nodule. EXAM: NUCLEAR MEDICINE PET SKULL BASE TO THIGH TECHNIQUE: 7.8 mCi F-18 FDG was injected intravenously. Full-ring PET imaging was performed from the skull base to thigh after the radiotracer. CT data was obtained and used for attenuation correction and anatomic localization. Fasting blood glucose: 98 mg/dl COMPARISON:  CT chest from 12/29/2018 FINDINGS: Mediastinal blood pool activity: SUV max 2.4 NECK: Symmetric tonsillar activity, likely physiologic. Incidental CT findings: Bilateral common carotid atherosclerotic calcification. CHEST: A right-sided mass along the pericardial margin with mediastinal involvement measures 5.6 by 2.0 cm on image 82/4 with a maximum SUV of 6.0. There is likely a smaller cystic or centrally necrotic portion laterally. This mass is contiguous with a pleural-based likely centrally necrotic lesion anteriorly along the right thorax adjacent to the right middle lobe and right upper lobe measuring 4.8 by  3.8 cm on image 77/4 with maximum SUV 3.7. Incidental CT findings: A left internal mammary lymph node measuring 5 mm in short axis on image 73/4 is not appreciably hypermetabolic. Left mastectomy. Coronary, aortic arch, and branch vessel atherosclerotic vascular disease. ABDOMEN/PELVIS: No significant abnormal hypermetabolic activity in this region. Incidental CT findings: Aortoiliac atherosclerotic vascular disease. Gastric diverticulum noted. Suspected  small cyst of the left mid kidney posterolaterally. SKELETON: No significant abnormal hypermetabolic activity in this region. Incidental CT findings: none IMPRESSION: 1. Right thoracic mass with pleural, pericardial, and likely mediastinal components. The lobular anterior pleural component is likely centrally necrotic given the central photopenia, and overall mass has a maximum SUV of 6.0. Possibilities might include thymic malignancy with local invasion, lymphoma, lung cancer with local spread, metastatic disease, or less likely mesothelioma. Tissue diagnosis is likely warranted. 2.  Aortic Atherosclerosis (ICD10-I70.0).  Coronary atherosclerosis. 3. Gastric diverticulum. Electronically Signed   By: Van Clines M.D.   On: 01/18/2019 09:40   Ct Biopsy  Result Date: 01/31/2019 INDICATION: 73 year old with pleural-based lesion in the anterior right chest and abnormal soft tissue in the anterior mediastinum. Tissue diagnosis is needed. Remote history of breast cancer. EXAM: CT-GUIDED BIOPSY OF RIGHT CHEST LESION MEDICATIONS: None. ANESTHESIA/SEDATION: Moderate (conscious) sedation was employed during this procedure. A total of Versed 2.5 mg and Fentanyl 100 mcg was administered intravenously. Moderate Sedation Time: 18 minutes. The patient's level of consciousness and vital signs were monitored continuously by radiology nursing throughout the procedure under my direct supervision. FLUOROSCOPY TIME:  None COMPLICATIONS: None immediate. PROCEDURE: Informed written consent was obtained from the patient after a thorough discussion of the procedural risks, benefits and alternatives. All questions were addressed. Maximal Sterile Barrier Technique was utilized including caps, mask, sterile gowns, sterile gloves, sterile drape, hand hygiene and skin antiseptic. A timeout was performed prior to the initiation of the procedure. Patient was placed supine on the CT scanner. Images through the chest were obtained. The  anterior pleural-based lesion was targeted. Soft tissue nodularity along the medial aspect of the pleural-based lesion was targeted that corresponds with hypermetabolic activity on the previous PET-CT. Anterior chest was prepped with chlorhexidine. Sterile field was created. Skin and soft tissues anesthetized with 1% lidocaine. 17 gauge coaxial needle directed into the lesion with CT guidance. Needle was positioned along the anterior chest wall. Three core biopsies were obtained with 18 gauge core device and specimens placed in saline. Needle removed without complication. Bandage placed over the puncture site. FINDINGS: Again noted is a pleural-based lesion in the anterior right lower chest. Needle position confirmed along the anterior pleural tissue. Three core biopsies obtained. Small amount of gas within the lesion at the end of the procedure. Negative for pneumothorax. IMPRESSION: CT-guided core biopsies of the anterior right chest pleural-based lesion. Electronically Signed   By: Markus Daft M.D.   On: 01/31/2019 09:39    CLINICAL DATA:  Evaluate right middle lobe pneumonia. History of breast cancer  EXAM: CT CHEST WITHOUT CONTRAST  TECHNIQUE: Multidetector CT imaging of the chest was performed following the standard protocol without IV contrast.  COMPARISON:  12/22/2018  FINDINGS: Cardiovascular: The heart size appears normal. No pericardial effusion. Aortic atherosclerosis. Calcification in the LAD, left circumflex and RCA coronary arteries noted.  Mediastinum/Nodes: Normal appearance of the thyroid gland. The trachea appears patent and is midline. 9 mm right supraclavicular lymph node identified. Low right paratracheal lymph node measures 1 cm, image 60/2. No subcarinal adenopathy. The hilar structures are suboptimally evaluated  due to lack of IV contrast material.  Lungs/Pleura: No pleural effusion identified. Large masslike density within the anterior basal right upper lobe  and anterior right middle lobe is identified 5.5 by 4.3 x 6.3 cm, image 68/2 and image 65/6. The mass appears to extend into the right anterior chest wall, image 64/6. There is also medial extension of the mass into the mediastinum with suspected pericardial involvement. The mass also extends around the anterior and right lateral aspect of the ascending thoracic aorta, image 79/2.  Within the lateral right upper lobe there is a focal area of bronchiectasis and subpleural thickening, image 25/7.  Upper Abdomen: No acute abnormality.  Musculoskeletal: No chest wall mass or suspicious bone lesions identified.  IMPRESSION: 1. Large mass within the anteromedial right midlung involving the right upper lobe and right middle lobe. The mass appears to extend into the anterior chest wall, mediastinum and pericardium. This is highly suspicious for neoplastic process. Further investigation with PET-CT is recommended.   Electronically Signed   By: Kerby Moors M.D.   On: 12/29/2018 13:51 I have independently reviewed the above radiology studies  and reviewed the findings with the patient.   Recent Lab Findings: Lab Results  Component Value Date   WBC 4.1 02/10/2019   HGB 11.9 (L) 02/10/2019   HCT 36.5 02/10/2019   PLT 247 02/10/2019   GLUCOSE 107 (H) 02/10/2019   CHOL 166 12/08/2018   TRIG 197.0 (H) 12/08/2018   HDL 39.40 12/08/2018   LDLCALC 87 12/08/2018   ALT 22 02/10/2019   AST 24 02/10/2019   NA 137 02/10/2019   K 4.6 02/10/2019   CL 103 02/10/2019   CREATININE 0.70 02/10/2019   BUN 10 02/10/2019   CO2 25 02/10/2019   TSH 1.78 12/08/2018   INR 1.0 02/10/2019   HGBA1C 6.1 (H) 11/30/2014      Assessment / Plan:   Right thoracic mass with pleural, pericardial, and likely mediastinal components-possibilities include thymic carcinoma, lung cancer, lymphoma or possibly necrotic lung from previous infection with surrounding inflammation.  The needle biopsy does  suggest necrotic tumor but is non diagnostic.  I discussed and reviewed the CT and PET scan findings with the patient and her husband and with the nondiagnostic biopsy have recommended that we proceed with right parasternal exploration and direct biopsy of the lung mass and anterior mediastinal mass to obtain a definitive tissue diagnosis, pathologic and microbiology specimens will be obtained.  Risks and options were discussed with the patient and her husband in detail and she is willing to proceed.  Patient is aware of the current coronavirus pandemic and that it does increase the risk of surgical procedures and nosocomial infection, but with the current possibility of ongoing malignancy obtaining an urgent tissue diagnosis is imperative and previous attempts with a less invasive procedure have been non diagnostic.  We will tentatively plan surgery for April 27.  The goals risks and alternatives of the planned surgical procedure Procedure(s): PARASTERNAL EXPLORATION (Right) LUNG BIOPSY (N/A)  have been discussed with the patient in detail. The risks of the procedure including death, infection, stroke, myocardial infarction, bleeding, blood transfusion have all been discussed specifically.  I have quoted [name] Reason a 2 % of perioperative mortality and a complication rate as high as 30  %. The patient's questions have been answered.Lexianna Weinrich is willing  to proceed with the planned procedure.  [name] MD      [address] [place] [address] Office  [place] [phone]  02/14/2019 7:14 AM

## 2019-02-14 NOTE — Discharge Summary (Signed)
Physician Discharge Summary       [address]       [place] [address]             [phone]    Patient ID: Autumn Conley MRN: [id number] DOB/AGE: 1946-03-04 73 y.o.  Admit date: 02/14/2019 Discharge date: 02/21/2019  Admission Diagnoses: Parasternal mediastinal mass  Discharge Diagnoses:  1. History of cancer of left breast (Autumn Conley) 2. History of GERD (gastroesophageal reflux disease) 3. History of NASH (nonalcoholic steatohepatitis) 4. History of Mitral valve prolapse 5. History of IBS (irritable bowel syndrome) 6. History of Hypertension 7. History of Hypercholesteremia 8. History of Sleep apnea 9. History of Fibromyalgia 10. History of anxiety and depression 11. History of glaucoma (both eyes) 12. History of PONV (postoperative nausea and vomiting)  Procedure (s): RIGHT PARASTERNAL MEDIAL EXPLORATION WITH BIOPIES by Dr. Servando Snare on 02/14/2019.  Pathology: pending-patient will be called with results.   History of Presenting Illness: Autumn Conley 73 y.o. female was seen  for evaluation of right lung mass.  Patient is a lifelong non-smoker , she notes that her symptoms began approximately 2 months ago.  She began having upper respiratory symptoms with cough and some sputum production she was treated as an outpatient with 3 rounds of antibiotics she denied any fever or chills denies hemoptysis ultimately a CT scan was performed which demonstrated findings noted a right pleural base mass extending to the mediastinum.  Confirmed on PET scan.  Attempted needle biopsy resulted in necrotic tissue.  The patient is been referred for further diagnostic studies including obtain a tissue diagnosis.  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  Right thoracic mass with pleural, pericardial, and likely mediastinal components-possibilities include thymic  carcinoma, lung cancer, lymphoma or possibly necrotic lung from previous infection with surrounding inflammation.  The needle biopsy does suggest necrotic tumor but is non diagnostic.  I discussed and reviewed the CT and PET scan findings with the patient and her husband and with the nondiagnostic biopsy have recommended that we proceed with right parasternal exploration and direct biopsy of the lung mass and anterior mediastinal mass to obtain a definitive tissue diagnosis, pathologic and microbiology specimens will be obtained.  Risks and options were discussed with the patient and her husband in detail and she is willing to proceed.  She was admitted on 04/27 in order to undergo the aforementioned surgery.  Brief Hospital Course:  The patient remained afebrile and hemodynamically stable. A line and foley were removed early in the post operative course. Blake drain output gradually decreased. Daily chest x rays were obtained and remained stable. a diet and has had a bowel movement. Wounds are clean and dry. Final chest X ray showed: No significant pneumothorax following removal of chest tube. Mild cardiomegaly without failure. Left greater than right basilar airspace disease. This is improved. Findings likely represent atelectasis. Small left effusion is again noted. Patient is felt surgically stable for discharge today.   Latest Vital Signs: Blood pressure (!) 152/80, pulse 71, temperature 98.3 F (36.8 C), temperature source Oral, resp. rate 14, height 5' 3"  (1.6 m), weight 72.7 kg, SpO2 94 %.  Physical Exam:  General appearance: alert, cooperative and no distress Heart: regular rate and rhythm, S1, S2 normal, no murmur, click, rub or gallop Lungs: clear to auscultation bilaterally Abdomen: soft, non-tender; bowel sounds normal; no masses,  no  organomegaly Extremities: extremities normal, atraumatic, no cyanosis or edema Wound: clean and dry   Discharge Condition: Stable and discharged to  home.  Recent laboratory studies:  Lab Results  Component Value Date   WBC 4.4 02/16/2019   HGB 9.7 (L) 02/16/2019   HCT 29.0 (L) 02/16/2019   MCV 89.8 02/16/2019   PLT 189 02/16/2019   Lab Results  Component Value Date   NA 137 02/16/2019   K 4.3 02/16/2019   CL 102 02/16/2019   CO2 28 02/16/2019   CREATININE 0.79 02/16/2019   GLUCOSE 92 02/16/2019      Diagnostic Studies: Dg Chest 2 View  Result Date: 02/16/2019 CLINICAL DATA:  Pneumothorax. EXAM: CHEST - 2 VIEW COMPARISON:  One-view chest x-ray 02/15/2019 FINDINGS: The right-sided chest tube has been removed. There is no new residual recurrent pneumothorax. The heart is mildly enlarged. Atherosclerotic changes are noted at the aortic arch. Left greater than right basilar airspace disease remains. Overall aeration is improved. IMPRESSION: 1. No significant pneumothorax following removal of chest tube. 2. Mild cardiomegaly without failure. 3. Left greater than right basilar airspace disease. This is improved. Findings likely represent atelectasis. Small left effusion is again noted. Electronically Signed   By: San Morelle M.D.   On: 02/16/2019 07:49   Dg Chest 2 View  Result Date: 02/14/2019 CLINICAL DATA:  Lung mass.  Preop for biopsy. EXAM: CHEST - 2 VIEW COMPARISON:  CT chest 12/29/2018.  Biopsy CT 01/31/2019. FINDINGS: The heart size is normal. Anterior right middle lobe pleural-based lung mass is again noted. Pleural based disease the right apex is again noted. No other focal lesions are evident. There is no edema or effusion. The visualized soft tissues and bony thorax are unremarkable. IMPRESSION: 1. Anterior right middle lobe lung mass again noted. 2. Right apical pleural disease noted. 3. No acute cardiopulmonary disease. Electronically Signed   By: San Morelle M.D.   On: 02/14/2019 06:54   Ct Biopsy  Result Date: 01/31/2019 INDICATION: 73 year old with pleural-based lesion in the anterior right chest and  abnormal soft tissue in the anterior mediastinum. Tissue diagnosis is needed. Remote history of breast cancer. EXAM: CT-GUIDED BIOPSY OF RIGHT CHEST LESION MEDICATIONS: None. ANESTHESIA/SEDATION: Moderate (conscious) sedation was employed during this procedure. A total of Versed 2.5 mg and Fentanyl 100 mcg was administered intravenously. Moderate Sedation Time: 18 minutes. The patient's level of consciousness and vital signs were monitored continuously by radiology nursing throughout the procedure under my direct supervision. FLUOROSCOPY TIME:  None COMPLICATIONS: None immediate. PROCEDURE: Informed written consent was obtained from the patient after a thorough discussion of the procedural risks, benefits and alternatives. All questions were addressed. Maximal Sterile Barrier Technique was utilized including caps, mask, sterile gowns, sterile gloves, sterile drape, hand hygiene and skin antiseptic. A timeout was performed prior to the initiation of the procedure. Patient was placed supine on the CT scanner. Images through the chest were obtained. The anterior pleural-based lesion was targeted. Soft tissue nodularity along the medial aspect of the pleural-based lesion was targeted that corresponds with hypermetabolic activity on the previous PET-CT. Anterior chest was prepped with chlorhexidine. Sterile field was created. Skin and soft tissues anesthetized with 1% lidocaine. 17 gauge coaxial needle directed into the lesion with CT guidance. Needle was positioned along the anterior chest wall. Three core biopsies were obtained with 18 gauge core device and specimens placed in saline. Needle removed without complication. Bandage placed over the puncture site. FINDINGS: Again noted is a pleural-based lesion in  the anterior right lower chest. Needle position confirmed along the anterior pleural tissue. Three core biopsies obtained. Small amount of gas within the lesion at the end of the procedure. Negative for  pneumothorax. IMPRESSION: CT-guided core biopsies of the anterior right chest pleural-based lesion. Electronically Signed   By: Markus Daft M.D.   On: 01/31/2019 09:39   Dg Chest Port 1 View  Result Date: 02/15/2019 CLINICAL DATA:  Pneumothorax.  Chest pain. EXAM: PORTABLE CHEST 1 VIEW COMPARISON:  One-view chest x-ray 02/14/2019 FINDINGS: Previously noted right pneumothorax is no longer present. Right-sided chest tube remains in place. The heart is enlarged. A small left pleural effusion and associated airspace disease is stable. Mild pulmonary vascular congestion is stable. Lung volumes remain low. IMPRESSION: 1. No significant pneumothorax. Right-sided chest tube remains in place. 2. Postoperative volume loss in the right lung. 3. Stable small left pleural effusion and associated airspace disease, likely atelectasis. Electronically Signed   By: San Morelle M.D.   On: 02/15/2019 08:24   Dg Chest Port 1 View  Result Date: 02/14/2019 CLINICAL DATA:  Postop right lung mass biopsy for mid chest pain. Previous history of left breast cancer. EXAM: PORTABLE CHEST 1 VIEW COMPARISON:  02/14/2019 as well as PET-CT 01/31/2019 FINDINGS: Lungs are hypoinflated as the previously seen right mid lung mass is no longer visualized. There is mild opacification within the lung bases suggesting atelectasis and possible small amount left pleural fluid. Drain is present over the medial right base. No pneumothorax. Cardiomediastinal silhouette and remainder of the exam is unchanged. IMPRESSION: Previously seen right midlung mass no longer visualized. Bibasilar opacification likely atelectasis with small left effusion. Drainage catheter over the medial right base. No pneumothorax. Electronically Signed   By: Marin Olp M.D.   On: 02/14/2019 11:02   Dg Chest Port 1 View  Result Date: 01/31/2019 CLINICAL DATA:  Status post right lung biopsy. EXAM: PORTABLE CHEST 1 VIEW COMPARISON:  PET-CT 01/18/2019. Chest CT  12/29/2018. Chest radiographs 12/22/2018. FINDINGS: The cardiac silhouette is normal in size. Rounded density projecting over the right hilum corresponds to the known anterior chest mass. No pneumothorax is identified. There is minimal scarring or atelectasis in the left lung base. Anterior eventration of the right hemidiaphragm is again noted. Surgical clips are present in the left chest wall and axilla. Aortic atherosclerosis is noted. IMPRESSION: No pneumothorax following biopsy. Electronically Signed   By: Logan Bores M.D.   On: 01/31/2019 11:08       Discharge Instructions    Discharge patient   Complete by:  As directed    Discharge disposition:  01-Home or Self Care   Discharge patient date:  02/16/2019      Discharge Medications: Allergies as of 02/16/2019   No Known Allergies     Medication List    STOP taking these medications   vitamin E 400 UNIT capsule     TAKE these medications   acetaminophen 500 MG tablet Commonly known as:  TYLENOL Take 2 tablets (1,000 mg total) by mouth every 6 (six) hours.   aspirin EC 81 MG tablet Take 81 mg by mouth daily.   atorvastatin 40 MG tablet Commonly known as:  LIPITOR Take 40 mg by mouth at bedtime.   Cholecalciferol 1.25 MG (50000 UT) Tabs 50,000 units PO qwk for 12 weeks. What changed:    how much to take  how to take this  when to take this  additional instructions   cycloSPORINE 0.05 % ophthalmic emulsion  Commonly known as:  RESTASIS Place 1 drop into both eyes 2 (two) times daily.   gabapentin 300 MG capsule Commonly known as:  NEURONTIN Take 300 mg by mouth at bedtime.   irbesartan 75 MG tablet Commonly known as:  AVAPRO Take 1 tablet (75 mg total) by mouth daily. What changed:    medication strength  how much to take   oxyCODONE 5 MG immediate release tablet Commonly known as:  Oxy IR/ROXICODONE Take 1 tablet (5 mg total) by mouth every 6 (six) hours as needed for severe pain.   PARoxetine 20  MG tablet Commonly known as:  PAXIL Take 3 tablets (60 mg total) by mouth every morning.   Peri-Colace 8.6-50 MG tablet Generic drug:  senna-docusate Take 1 tablet by mouth daily as needed for mild constipation.   SUMAtriptan 100 MG tablet Commonly known as:  IMITREX Take 1 tablet by mouth 2 (two) times daily as needed. At least 2 hrs between doses as needed bid   vitamin C 1000 MG tablet Take 1,000 mg by mouth daily.       Follow Up Appointments: Follow-up Information    [name], MD. Go on 02/14/2019.   Specialty:  Cardiothoracic Surgery Why:  PA/LAT CXR to be taken 30 minutes before appointment time. CXR at [facility] which is in the same building as Dr. Everrett Coombe office on the first floor. Appointment time is at 1:30pm on 02/24/2019.  Contact information: [place] [address] [phone]        Briscoe Deutscher, DO. Call in 1 day(s).   Specialty:  Family Medicine Contact information: [place] [address] [phone]           Signed: [name] PA-C 02/21/2019, 11:07 AM

## 2019-02-14 NOTE — Transfer of Care (Signed)
Immediate Anesthesia Transfer of Care Note  Patient: Autumn Conley  Procedure(s) Performed: PARASTERNAL MEDIAL EXPLORATION WITH BIOPIES. (Right Chest)  Patient Location: PACU  Anesthesia Type:General  Level of Consciousness: awake and patient cooperative  Airway & Oxygen Therapy: Patient Spontanous Breathing and Patient connected to nasal cannula oxygen  Post-op Assessment: Report given to RN, Post -op Vital signs reviewed and stable and Patient moving all extremities  Post vital signs: Reviewed and stable  Last Vitals:  Vitals Value Taken Time  BP 114/102 02/14/2019  9:57 AM  Temp    Pulse 93 02/14/2019  9:59 AM  Resp 20 02/14/2019  9:59 AM  SpO2 95 % 02/14/2019  9:59 AM  Vitals shown include unvalidated device data.  Last Pain:  Vitals:   02/14/19 0615  TempSrc:   PainSc: 0-No pain         Complications: No apparent anesthesia complications

## 2019-02-15 ENCOUNTER — Encounter (HOSPITAL_COMMUNITY): Payer: Self-pay | Admitting: Cardiothoracic Surgery

## 2019-02-15 ENCOUNTER — Inpatient Hospital Stay (HOSPITAL_COMMUNITY): Payer: Medicare Other

## 2019-02-15 LAB — BASIC METABOLIC PANEL
Anion gap: 7 (ref 5–15)
BUN: 10 mg/dL (ref 8–23)
CO2: 27 mmol/L (ref 22–32)
Calcium: 9 mg/dL (ref 8.9–10.3)
Chloride: 100 mmol/L (ref 98–111)
Creatinine, Ser: 0.65 mg/dL (ref 0.44–1.00)
GFR calc Af Amer: >60 mL/min (ref >60)
GFR calc non Af Amer: >60 mL/min (ref >60)
Glucose, Bld: 114 mg/dL — ABNORMAL HIGH (ref 70–99)
Potassium: 4.4 mmol/L (ref 3.5–5.1)
Sodium: 134 mmol/L — ABNORMAL LOW (ref 135–145)

## 2019-02-15 LAB — BLOOD GAS, ARTERIAL
Acid-Base Excess: 1.3 mmol/L (ref 0.0–2.0)
Bicarbonate: 25.9 mmol/L (ref 20.0–28.0)
Drawn by: 55062
O2 Content: 2 L/min
O2 Saturation: 97.1 %
Patient temperature: 98.3
pCO2 arterial: 44.8 mmHg (ref 32.0–48.0)
pH, Arterial: 7.38 (ref 7.350–7.450)
pO2, Arterial: 93.9 mmHg (ref 83.0–108.0)

## 2019-02-15 LAB — CBC
HCT: 28.8 % — ABNORMAL LOW (ref 36.0–46.0)
Hemoglobin: 9.5 g/dL — ABNORMAL LOW (ref 12.0–15.0)
MCH: 29.2 pg (ref 26.0–34.0)
MCHC: 33 g/dL (ref 30.0–36.0)
MCV: 88.6 fL (ref 80.0–100.0)
Platelets: 204 10*3/uL (ref 150–400)
RBC: 3.25 MIL/uL — ABNORMAL LOW (ref 3.87–5.11)
RDW: 13.4 % (ref 11.5–15.5)
WBC: 7 10*3/uL (ref 4.0–10.5)
nRBC: 0 % (ref 0.0–0.2)

## 2019-02-15 LAB — GLUCOSE, CAPILLARY
Glucose-Capillary: 89 mg/dL (ref 70–99)
Glucose-Capillary: 75 mg/dL (ref 70–99)
Glucose-Capillary: 87 mg/dL (ref 70–99)

## 2019-02-15 MED ORDER — WHITE PETROLATUM EX OINT
TOPICAL_OINTMENT | CUTANEOUS | Status: AC
  Administered 2019-02-15: 10:00:00
  Filled 2019-02-15: qty 28.35

## 2019-02-15 NOTE — Progress Notes (Addendum)
      [address]       [place] [address]             [phone]       1 Day Post-Op Procedure(s) (LRB): PARASTERNAL MEDIAL EXPLORATION WITH BIOPIES. (Right)  Subjective: Patient had nausea yesterday (no vomiting) but less so this am. She is making up her coffee this am.  Objective: Vital signs in last 24 hours: Temp:  [97.3 F (36.3 C)-98.4 F (36.9 C)] 98.3 F (36.8 C) (04/28 0420) Pulse Rate:  [75-93] 75 (04/28 0420) Cardiac Rhythm: Normal sinus rhythm (04/27 1934) Resp:  [12-20] 12 (04/28 0420) BP: (111-152)/(61-89) 119/73 (04/28 0420) SpO2:  [95 %-99 %] 98 % (04/28 0420)     Intake/Output from previous day: 04/27 0701 - 04/28 0700 In: 2620 [P.O.:200; I.V.:2420] Out: 1257 [Urine:1200; Drains:47; Blood:10]   Physical Exam:  Cardiovascular: RRR Pulmonary: Clear to auscultation bilaterally Abdomen: Soft, non tender, bowel sounds present. Extremities: SCDs in place Wounds: Clean and dry.  No erythema or signs of infection. Blake drain:to bulb suction, minor output  Lab Results: CBC: Recent Labs    02/15/19 0212  WBC 7.0  HGB 9.5*  HCT 28.8*  PLT 204   BMET:  Recent Labs    02/15/19 0212  NA 134*  K 4.4  CL 100  CO2 27  GLUCOSE 114*  BUN 10  CREATININE 0.65  CALCIUM 9.0    PT/INR: No results for input(s): LABPROT, INR in the last 72 hours. ABG:  INR: Will add last result for INR, ABG once components are confirmed Will add last 4 CBG results once components are confirmed  Assessment/Plan: 1. CV - SR in the 70's. Low dose Irbesartan started for BP. 2.  Pulmonary - Slightly less than 50 cc for output from [place] drain. On 2 liters of oxygen via [employer]. CXR this am appears stable. [place] to remove drain. Check CXR in am. Await final pathology. 3. Anemia-H and H 9.5 and 28.8 4. Decrease IVF   Donielle M ZimmermanPA-C 02/15/2019,7:15 AM [phone]  Final path pending No foley was placed  Transition to po pain meds  Drain out  later today if no further drainage  Talked with patients husband Poss home tomorrow I have seen and examined [name] Reason and agree with the above assessment  and plan.  [name] MD Beeper [phone] Office [phone] 02/15/2019 10:08 AM

## 2019-02-15 NOTE — Progress Notes (Signed)
Removed JP drain as followed order. Wasted fentanyl 20 ml to stericycle and witness by [employer]. HS [employer]

## 2019-02-15 NOTE — Anesthesia Postprocedure Evaluation (Signed)
Anesthesia Post Note  Patient: Autumn Conley  Procedure(s) Performed: PARASTERNAL MEDIAL EXPLORATION WITH BIOPIES. (Right Chest)     Patient location during evaluation: PACU Anesthesia Type: General Level of consciousness: awake and alert Pain management: pain level controlled Vital Signs Assessment: post-procedure vital signs reviewed and stable Respiratory status: spontaneous breathing, nonlabored ventilation, respiratory function stable and patient connected to nasal cannula oxygen Cardiovascular status: blood pressure returned to baseline and stable Postop Assessment: no apparent nausea or vomiting Anesthetic complications: no    Last Vitals:  Vitals:   02/15/19 0000 02/15/19 0420  BP: 111/61 119/73  Pulse:  75  Resp: 15 12  Temp:  36.8 C  SpO2:  98%    Last Pain:  Vitals:   02/15/19 0431  TempSrc:   PainSc: [name]

## 2019-02-15 NOTE — Op Note (Signed)
NAMETASHEBA, HENSON MEDICAL RECORD [id number] ACCOUNT [id number] DATE OF BIRTH:11-25-1945 FACILITY: [facility] LOCATION: [place], [place]  OPERATIVE REPORT  DATE OF PROCEDURE:  02/14/2019  PREOPERATIVE DIAGNOSIS:  Right anterior mediastinal mass.  POSTOPERATIVE DIAGNOSIS:  Right anterior mediastinal mass.  Final pathology pending.  PROCEDURE PERFORMED:  Right parasternal exploration with debridement and biopsy of right anterior mediastinal mass.  SURGEON:  Lanelle Bal, MD  FIRST ASSISTANT:  Lars Pinks, PA-C  BRIEF HISTORY:  The patient is a 73 year old lifelong nonsmoker.  The patient with a previous history of breast cancer status post left mastectomy.  The patient presented with a cough and respiratory symptoms.  A CT scan of the chest was performed which  showed a right anterior mediastinal area, hypermetabolic with probable central necrosis.  Attempts at obtaining a tissue diagnosis of this were unsuccessful with percutaneous needle biopsy as only necrotic tissue was seen and no definitive diagnosis  could be made.  To obtain a tissue diagnosis, we then recommended to the patient that we proceed with right parasternal exploration and direct biopsy.  The patient was agreeable with this.  Risks and options were discussed with her including the somewhat  increased risk of surgery during a pandemic, but less invasive procedures that had been attempted were unsuccessful.  DESCRIPTION OF PROCEDURE:  The patient underwent general endotracheal anesthesia without incident.  A double-lumen endotracheal tube was placed in case we needed to deflate the right lung.  The right chest and anterior chest were prepped with Betadine  and draped in a sterile manner.  Appropriate timeout was performed.  We then made a small right parasternal incision approximately 4th intercostal space.  Dissection was carried down to the attachments of the pectoralis.  Fibers were separated,  and the  chest cavity anterior mediastinum was entered.  A 3 cm area of white necrotic-appearing tissue was debrided away.  A portion of this was sent for frozen section and a portion sent for culture.  The initial fragments of the frozen section showed necrotic  tissue with some areas of increased cellularity, but no definitive diagnosis could be made.  We continued our dissection slightly deeper until we noted a firmer area of firm tissue at the anterior mediastinum along where ____ mammary chain nodes would be  located.  A portion of this tissue was biopsied.  Initial frozen section showed adequate tissue for study.  A portion was sent for flow cytometry.  The pathologist described the tissue as "multiple small blue cells." Additional tissue from the same area  was also submitted in a third specimen cup to pathology for further studies.  After being assured we had sufficient tissue, a small 15-French Blake drain was left in the area and brought out through a separate site.  The incision was closed with  interrupted 2-0 Vicryl in the subcutaneous layers and a running 3-0 Vicryl in subcuticular stitch was used to close the incision.  Dermabond was applied and a small dressing on the chest tube/drainage site.  Sponge and needle count was reported as  correct at the completion of the procedure.  Blood loss was minimal.  The patient was awakened and extubated in the operating room, having tolerated the procedure without obvious complication.  The patient was then transferred to the recovery room for  postoperative care.  LN/NUANCE  D:02/14/2019 T:02/15/2019 JOB:[id number]

## 2019-02-16 ENCOUNTER — Inpatient Hospital Stay (HOSPITAL_COMMUNITY): Payer: Medicare Other

## 2019-02-16 LAB — CBC
HCT: 29 % — ABNORMAL LOW (ref 36.0–46.0)
Hemoglobin: 9.7 g/dL — ABNORMAL LOW (ref 12.0–15.0)
MCH: 30 pg (ref 26.0–34.0)
MCHC: 33.4 g/dL (ref 30.0–36.0)
MCV: 89.8 fL (ref 80.0–100.0)
Platelets: 189 10*3/uL (ref 150–400)
RBC: 3.23 MIL/uL — ABNORMAL LOW (ref 3.87–5.11)
RDW: 13.4 % (ref 11.5–15.5)
WBC: 4.4 10*3/uL (ref 4.0–10.5)
nRBC: 0 % (ref 0.0–0.2)

## 2019-02-16 LAB — COMPREHENSIVE METABOLIC PANEL
ALT: 16 U/L (ref 0–44)
AST: 19 U/L (ref 15–41)
Albumin: 3.4 g/dL — ABNORMAL LOW (ref 3.5–5.0)
Alkaline Phosphatase: 76 U/L (ref 38–126)
Anion gap: 7 (ref 5–15)
BUN: 14 mg/dL (ref 8–23)
CO2: 28 mmol/L (ref 22–32)
Calcium: 8.9 mg/dL (ref 8.9–10.3)
Chloride: 102 mmol/L (ref 98–111)
Creatinine, Ser: 0.79 mg/dL (ref 0.44–1.00)
GFR calc Af Amer: >60 mL/min (ref >60)
GFR calc non Af Amer: >60 mL/min (ref >60)
Glucose, Bld: 92 mg/dL (ref 70–99)
Potassium: 4.3 mmol/L (ref 3.5–5.1)
Sodium: 137 mmol/L (ref 135–145)
Total Bilirubin: 0.3 mg/dL (ref 0.3–1.2)
Total Protein: 5.9 g/dL — ABNORMAL LOW (ref 6.5–8.1)

## 2019-02-16 MED ORDER — IRBESARTAN 75 MG PO TABS: 75.0000 mg | ORAL_TABLET | Freq: Every day | ORAL | 1 refills | Status: DC

## 2019-02-16 MED ORDER — ACETAMINOPHEN 500 MG PO TABS: 1000.0000 mg | ORAL_TABLET | Freq: Four times a day (QID) | ORAL | 0 refills | Status: DC

## 2019-02-16 MED ORDER — OXYCODONE HCL 5 MG PO TABS: 5.0000 mg | ORAL_TABLET | Freq: Four times a day (QID) | ORAL | 0 refills | Status: DC | PRN

## 2019-02-16 NOTE — Progress Notes (Signed)
Removed PIV access x 1 and pt received the discharge instruction. Patient understood well. HS [employer]

## 2019-02-16 NOTE — Progress Notes (Addendum)
      [address]       [place] [address]             [phone]      2 Days Post-Op Procedure(s) (LRB): PARASTERNAL MEDIAL EXPLORATION WITH BIOPIES. (Right) Subjective: Nausea seems to have passed. Feels okay this morning.   Objective: Vital signs in last 24 hours: Temp:  [97.6 F (36.4 C)-98.2 F (36.8 C)] 98.2 F (36.8 C) (04/28 2345) Pulse Rate:  [71-75] 71 (04/28 2345) Cardiac Rhythm: Normal sinus rhythm (04/29 0300) Resp:  [14-22] 14 (04/29 0300) BP: (102-129)/(46-75) 120/75 (04/28 2345) SpO2:  [91 %-100 %] 96 % (04/29 0300)     Intake/Output from previous day: 04/28 0701 - 04/29 0700 In: 1318.5 [P.O.:805; I.V.:513.5] Out: 822 [Urine:800; Drains:22] Intake/Output this shift: No intake/output data recorded.  General appearance: alert, cooperative and no distress Heart: regular rate and rhythm, S1, S2 normal, no murmur, click, rub or gallop Lungs: clear to auscultation bilaterally Abdomen: soft, non-tender; bowel sounds normal; no masses,  no organomegaly Extremities: extremities normal, atraumatic, no cyanosis or edema Wound: clean and dry  Lab Results: Recent Labs    02/15/19 0212 02/16/19 0212  WBC 7.0 4.4  HGB 9.5* 9.7*  HCT 28.8* 29.0*  PLT 204 189   BMET:  Recent Labs    02/15/19 0212 02/16/19 0212  NA 134* 137  K 4.4 4.3  CL 100 102  CO2 27 28  GLUCOSE 114* 92  BUN 10 14  CREATININE 0.65 0.79  CALCIUM 9.0 8.9    PT/INR: No results for input(s): LABPROT, INR in the last 72 hours. ABG    Component Value Date/Time   PHART 7.380 02/15/2019 0455   HCO3 25.9 02/15/2019 0455   O2SAT 97.1 02/15/2019 0455   CBG (last 3)  Recent Labs    02/15/19 0605 02/15/19 1114 02/15/19 1627  GLUCAP 89 75 87    Assessment/Plan: S/P Procedure(s) (LRB): PARASTERNAL MEDIAL EXPLORATION WITH BIOPIES. (Right)  1. CV-NSR in the 60s. BP well controlled. Continue ASA and statin. 2. Pulm-tolerating 2L [place] with good oxygen saturation.  CXR appears stable 3. Renal-creatinine 0.79, electrolytes okay. 4. H and H stable at 9.7/29.0, expected acute blood loss anemia 5. Endo-blood glucose has been well controlled.  Plan: Likely home later today. She is recovering well. Discharge instructions reviewed.    LOS: 2 days    Autumn Conley 02/16/2019   Patient feels well Path still pending, will call her when have results I have seen and examined Autumn Conley and agree with the above assessment  and plan.  Autumn Isaac MD Beeper [phone] Office [phone] 02/16/2019 9:45 AM

## 2019-02-19 LAB — AEROBIC/ANAEROBIC CULTURE W GRAM STAIN (SURGICAL/DEEP WOUND): Culture: NO GROWTH

## 2019-02-23 ENCOUNTER — Other Ambulatory Visit: Payer: Self-pay | Admitting: Cardiothoracic Surgery

## 2019-02-23 DIAGNOSIS — J9859 Other diseases of mediastinum, not elsewhere classified: Secondary | ICD-10-CM

## 2019-02-24 ENCOUNTER — Encounter: Payer: Self-pay | Admitting: Cardiothoracic Surgery

## 2019-02-24 ENCOUNTER — Ambulatory Visit
Admission: RE | Admit: 2019-02-24 | Discharge: 2019-02-24 | Disposition: A | Discharge status: Home / Self Care | Payer: Medicare Other | Source: Ambulatory Visit | Attending: Cardiothoracic Surgery | Admitting: Cardiothoracic Surgery

## 2019-02-24 ENCOUNTER — Other Ambulatory Visit: Payer: Self-pay

## 2019-02-24 ENCOUNTER — Ambulatory Visit (INDEPENDENT_AMBULATORY_CARE_PROVIDER_SITE_OTHER): Payer: Self-pay | Admitting: Cardiothoracic Surgery

## 2019-02-24 VITALS — BP 140/82 | HR 78 | Temp 97.5°F | Resp 16 | Ht 63.0 in | Wt 160.0 lb

## 2019-02-24 DIAGNOSIS — J9859 Other diseases of mediastinum, not elsewhere classified: Secondary | ICD-10-CM

## 2019-03-01 ENCOUNTER — Telehealth: Payer: Self-pay | Admitting: Cardiothoracic Surgery

## 2019-03-01 ENCOUNTER — Encounter (HOSPITAL_COMMUNITY): Payer: Self-pay

## 2019-03-01 NOTE — Telephone Encounter (Signed)
Patient called and updated on path results, Information also sent to Dr Julien Nordmann to set up follow up appointment in oncology clinic .[name] MD      [address] [place] [address] Office [phone]   [place]

## 2019-03-02 ENCOUNTER — Telehealth: Payer: Self-pay | Admitting: Internal Medicine

## 2019-03-02 NOTE — Progress Notes (Signed)
[address]       [place] [address]             [phone]                  Autumn Conley Medical Record #[id number] Date of Birth: Nov 13, 1945  Referring MI:WOEHOZY, Julien Nordmann, MD Primary Cardiology: Primary Care:Wallace, Danae Chen, DO  Chief Complaint:  Follow Up Visit OPERATIVE REPORT DATE OF PROCEDURE:  02/14/2019 PREOPERATIVE DIAGNOSIS:  Right anterior mediastinal mass. POSTOPERATIVE DIAGNOSIS:  Right anterior mediastinal mass.  Final pathology pending. PROCEDURE PERFORMED:  Right parasternal exploration with debridement and biopsy of right anterior mediastinal mass. SURGEON:  Lanelle Bal, MD  Cancer Staging No matching staging information was found for the patient. pending   History of Present Illness:     Patient returns to the office today and after recent surgical biopsy with right parasternal exploration.  She has minimal incisional discomfort.  She has been increasing her activity appropriately.  Denies any significant shortness of breath.       Zubrod Score: At the time of surgery this patient's most appropriate activity status/level should be described as: []     0    Normal activity, no symptoms [x]     1    Restricted in physical strenuous activity but ambulatory, able to do out light work []     2    Ambulatory and capable of self care, unable to do work activities, up and about                 >50 % of waking hours                                                                                   []     3    Only limited self care, in bed greater than 50% of waking hours []     4    Completely disabled, no self care, confined to bed or chair []     5    Moribund  Social History   Tobacco Use  Smoking Status Never Smoker  Smokeless Tobacco Never Used       No Known Allergies  Current Outpatient Medications  Medication Sig Dispense Refill  . acetaminophen (TYLENOL) 500 MG tablet Take 2 tablets (1,000 mg total) by mouth  every 6 (six) hours. 30 tablet 0  . Ascorbic Acid (VITAMIN C) 1000 MG tablet Take 1,000 mg by mouth daily.    Marland Kitchen aspirin EC 81 MG tablet Take 81 mg by mouth daily.    Marland Kitchen atorvastatin (LIPITOR) 40 MG tablet Take 40 mg by mouth at bedtime.     . cycloSPORINE (RESTASIS) 0.05 % ophthalmic emulsion Place 1 drop into both eyes 2 (two) times daily.    Marland Kitchen gabapentin (NEURONTIN) 300 MG capsule Take 300 mg by mouth at bedtime.     . irbesartan (AVAPRO) 75 MG tablet Take 1 tablet (75 mg total) by mouth daily. 30 tablet 1  . PARoxetine (PAXIL) 20 MG tablet Take 3 tablets (60 mg total) by mouth every morning. 270 tablet 0  . senna-docusate (PERI-COLACE) 8.6-50 MG tablet Take 1  tablet by mouth daily as needed for mild constipation.     . SUMAtriptan (IMITREX) 100 MG tablet Take 1 tablet by mouth 2 (two) times daily as needed. At least 2 hrs between doses as needed bid    . Cholecalciferol 1.25 MG (50000 UT) TABS 50,000 units PO qwk for 12 weeks. (Patient not taking: Reported on 02/24/2019) 12 tablet 0  . oxyCODONE (OXY IR/ROXICODONE) 5 MG immediate release tablet Take 1 tablet (5 mg total) by mouth every 6 (six) hours as needed for severe pain. (Patient not taking: Reported on 02/24/2019) 10 tablet 0   No current facility-administered medications for this visit.        Physical Exam: BP 140/82 (BP Location: Left Arm, Patient Position: Sitting, Cuff Size: Normal)   Pulse 78   Temp (!) 97.5 F (36.4 C) (Skin)   Resp 16   Ht 5' 3"  (1.6 m)   Wt 160 lb (72.6 kg)   SpO2 99% Comment: ON RA  BMI 28.34 kg/m   General appearance: alert and cooperative Neurologic: intact Heart: regular rate and rhythm, S1, S2 normal, no murmur, click, rub or gallop Lungs: clear to auscultation bilaterally Abdomen: soft, non-tender; bowel sounds normal; no masses,  no organomegaly Extremities: extremities normal, atraumatic, no cyanosis or edema and Homans sign is negative, no sign of DVT Wound: Right parasternal incision is  healing well without evidence of infection, subcuticular sutures were used   Diagnostic Studies & Laboratory data:         Recent Radiology Findings: Dg Chest 2 View  Result Date: 02/24/2019 CLINICAL DATA:  Mediastinal mass EXAM: CHEST - 2 VIEW COMPARISON:  Chest x-ray dated 04/29 2020. FINDINGS: The cardiac silhouette is not significantly enlarged. There is no pneumothorax. There is no large pleural effusion. No acute osseous abnormality detected. There is increased attenuation in the right perihilar region, likely representing the patient's known mediastinal mass. There is some pleural-parenchymal scarring at the right lung apex. IMPRESSION: 1. No acute cardiopulmonary process identified. 2. Fullness of the right perihilar region, likely related to the patient's known mediastinal mass or anterior chest wall mass. This can be further evaluated with a contrast-enhanced CT as clinically indicated. Electronically Signed   By: Constance Holster M.D.   On: 02/24/2019 13:27   Dg Chest 2 View  Result Date: 02/16/2019 CLINICAL DATA:  Pneumothorax. EXAM: CHEST - 2 VIEW COMPARISON:  One-view chest x-ray 02/15/2019 FINDINGS: The right-sided chest tube has been removed. There is no new residual recurrent pneumothorax. The heart is mildly enlarged. Atherosclerotic changes are noted at the aortic arch. Left greater than right basilar airspace disease remains. Overall aeration is improved. IMPRESSION: 1. No significant pneumothorax following removal of chest tube. 2. Mild cardiomegaly without failure. 3. Left greater than right basilar airspace disease. This is improved. Findings likely represent atelectasis. Small left effusion is again noted. Electronically Signed   By: San Morelle M.D.   On: 02/16/2019 07:49   Dg Chest 2 View  Result Date: 02/14/2019 CLINICAL DATA:  Lung mass.  Preop for biopsy. EXAM: CHEST - 2 VIEW COMPARISON:  CT chest 12/29/2018.  Biopsy CT 01/31/2019. FINDINGS: The heart size is  normal. Anterior right middle lobe pleural-based lung mass is again noted. Pleural based disease the right apex is again noted. No other focal lesions are evident. There is no edema or effusion. The visualized soft tissues and bony thorax are unremarkable. IMPRESSION: 1. Anterior right middle lobe lung mass again noted. 2. Right apical pleural disease  noted. 3. No acute cardiopulmonary disease. Electronically Signed   By: San Morelle M.D.   On: 02/14/2019 06:54     I have independently reviewed the above radiology findings and reviewed findings  with the patient.  Recent Labs: Lab Results  Component Value Date   WBC 4.4 02/16/2019   HGB 9.7 (L) 02/16/2019   HCT 29.0 (L) 02/16/2019   PLT 189 02/16/2019   GLUCOSE 92 02/16/2019   CHOL 166 12/08/2018   TRIG 197.0 (H) 12/08/2018   HDL 39.40 12/08/2018   LDLCALC 87 12/08/2018   ALT 16 02/16/2019   AST 19 02/16/2019   NA 137 02/16/2019   K 4.3 02/16/2019   CL 102 02/16/2019   CREATININE 0.79 02/16/2019   BUN 14 02/16/2019   CO2 28 02/16/2019   TSH 1.78 12/08/2018   INR 1.0 02/10/2019   HGBA1C 6.1 (H) 11/30/2014      Assessment / Plan:   Stable postop with healing incision, I reviewed with the patient the preliminary findings of the pathology.  In discussion with Dr. Lyndon Code the reviewing reviewing pathologist.  The final diagnosis is still pending and the slides have been [facility] for second opinion.  I have explained the delay to the patient and her assured her as soon as I had results would let her know and we would get her back into see Dr. Earlie Server.    Medication Changes: No orders of the defined types were placed in this encounter.    [name] 03/02/2019 12:27 PM

## 2019-03-02 NOTE — Telephone Encounter (Signed)
Scheduled appt per 5/12 sch message - pt is aware of apt date and time

## 2019-03-07 ENCOUNTER — Other Ambulatory Visit: Payer: Self-pay

## 2019-03-07 ENCOUNTER — Inpatient Hospital Stay: Payer: Medicare Other

## 2019-03-07 ENCOUNTER — Encounter: Payer: Self-pay | Admitting: Internal Medicine

## 2019-03-07 ENCOUNTER — Inpatient Hospital Stay: Payer: Medicare Other | Attending: Internal Medicine | Admitting: Internal Medicine

## 2019-03-07 ENCOUNTER — Other Ambulatory Visit: Payer: Self-pay | Admitting: Medical Oncology

## 2019-03-07 VITALS — BP 147/82 | HR 88 | Temp 98.5°F | Resp 18 | Ht 63.0 in | Wt 162.0 lb

## 2019-03-07 DIAGNOSIS — Z853 Personal history of malignant neoplasm of breast: Secondary | ICD-10-CM | POA: Diagnosis not present

## 2019-03-07 DIAGNOSIS — Z7982 Long term (current) use of aspirin: Secondary | ICD-10-CM | POA: Insufficient documentation

## 2019-03-07 DIAGNOSIS — M797 Fibromyalgia: Secondary | ICD-10-CM | POA: Insufficient documentation

## 2019-03-07 DIAGNOSIS — H409 Unspecified glaucoma: Secondary | ICD-10-CM | POA: Diagnosis not present

## 2019-03-07 DIAGNOSIS — M1990 Unspecified osteoarthritis, unspecified site: Secondary | ICD-10-CM | POA: Diagnosis not present

## 2019-03-07 DIAGNOSIS — K7581 Nonalcoholic steatohepatitis (NASH): Secondary | ICD-10-CM | POA: Diagnosis not present

## 2019-03-07 DIAGNOSIS — J90 Pleural effusion, not elsewhere classified: Secondary | ICD-10-CM | POA: Insufficient documentation

## 2019-03-07 DIAGNOSIS — E7800 Pure hypercholesterolemia, unspecified: Secondary | ICD-10-CM | POA: Insufficient documentation

## 2019-03-07 DIAGNOSIS — I341 Nonrheumatic mitral (valve) prolapse: Secondary | ICD-10-CM | POA: Insufficient documentation

## 2019-03-07 DIAGNOSIS — G4730 Sleep apnea, unspecified: Secondary | ICD-10-CM | POA: Insufficient documentation

## 2019-03-07 DIAGNOSIS — C8350 Lymphoblastic (diffuse) lymphoma, unspecified site: Secondary | ICD-10-CM | POA: Diagnosis not present

## 2019-03-07 DIAGNOSIS — F419 Anxiety disorder, unspecified: Secondary | ICD-10-CM | POA: Diagnosis not present

## 2019-03-07 DIAGNOSIS — Z79899 Other long term (current) drug therapy: Secondary | ICD-10-CM | POA: Diagnosis not present

## 2019-03-07 DIAGNOSIS — K219 Gastro-esophageal reflux disease without esophagitis: Secondary | ICD-10-CM | POA: Insufficient documentation

## 2019-03-07 DIAGNOSIS — Z9012 Acquired absence of left breast and nipple: Secondary | ICD-10-CM | POA: Insufficient documentation

## 2019-03-07 DIAGNOSIS — I10 Essential (primary) hypertension: Secondary | ICD-10-CM | POA: Insufficient documentation

## 2019-03-07 DIAGNOSIS — K589 Irritable bowel syndrome without diarrhea: Secondary | ICD-10-CM | POA: Diagnosis not present

## 2019-03-07 DIAGNOSIS — R918 Other nonspecific abnormal finding of lung field: Secondary | ICD-10-CM

## 2019-03-07 DIAGNOSIS — F329 Major depressive disorder, single episode, unspecified: Secondary | ICD-10-CM | POA: Insufficient documentation

## 2019-03-07 DIAGNOSIS — C8442 Peripheral T-cell lymphoma, not classified, intrathoracic lymph nodes: Secondary | ICD-10-CM

## 2019-03-07 LAB — CBC WITH DIFFERENTIAL (CANCER CENTER ONLY)
Abs Immature Granulocytes: 0 10*3/uL (ref 0.00–0.07)
Basophils Absolute: 0.1 10*3/uL (ref 0.0–0.1)
Basophils Relative: 2 %
Eosinophils Absolute: 0.2 10*3/uL (ref 0.0–0.5)
Eosinophils Relative: 3 %
HCT: 36.2 % (ref 36.0–46.0)
Hemoglobin: 11.8 g/dL — ABNORMAL LOW (ref 12.0–15.0)
Immature Granulocytes: 0 %
Lymphocytes Relative: 27 %
Lymphs Abs: 1.3 10*3/uL (ref 0.7–4.0)
MCH: 29.1 pg (ref 26.0–34.0)
MCHC: 32.6 g/dL (ref 30.0–36.0)
MCV: 89.2 fL (ref 80.0–100.0)
Monocytes Absolute: 0.6 10*3/uL (ref 0.1–1.0)
Monocytes Relative: 11 %
Neutro Abs: 2.8 10*3/uL (ref 1.7–7.7)
Neutrophils Relative %: 57 %
Platelet Count: 239 10*3/uL (ref 150–400)
RBC: 4.06 MIL/uL (ref 3.87–5.11)
RDW: 13.7 % (ref 11.5–15.5)
WBC Count: 4.9 10*3/uL (ref 4.0–10.5)
nRBC: 0 % (ref 0.0–0.2)

## 2019-03-07 LAB — CMP (CANCER CENTER ONLY)
ALT: 23 U/L (ref 0–44)
AST: 20 U/L (ref 15–41)
Albumin: 4.3 g/dL (ref 3.5–5.0)
Alkaline Phosphatase: 100 U/L (ref 38–126)
Anion gap: 10 (ref 5–15)
BUN: 15 mg/dL (ref 8–23)
CO2: 28 mmol/L (ref 22–32)
Calcium: 9.9 mg/dL (ref 8.9–10.3)
Chloride: 102 mmol/L (ref 98–111)
Creatinine: 0.8 mg/dL (ref 0.44–1.00)
GFR, Est AFR Am: >60 mL/min (ref >60)
GFR, Estimated: >60 mL/min (ref >60)
Glucose, Bld: 105 mg/dL — ABNORMAL HIGH (ref 70–99)
Potassium: 4.7 mmol/L (ref 3.5–5.1)
Sodium: 140 mmol/L (ref 135–145)
Total Bilirubin: 0.3 mg/dL (ref 0.3–1.2)
Total Protein: 7.8 g/dL (ref 6.5–8.1)

## 2019-03-07 NOTE — Progress Notes (Signed)
Virtual Visit via Video   Due to the COVID-19 pandemic, this visit was completed with telemedicine (audio/video) technology to reduce patient and provider exposure as well as to preserve personal protective equipment.   I connected with Autumn Conley by a video enabled telemedicine application and verified that I am speaking with the correct person using two identifiers. Location patient: Home Location provider: [facility], Office Persons participating in the virtual visit: Autumn Conley, [place], DO Lonell Grandchild, CMA acting as scribe for Dr. Briscoe Deutscher.    I discussed the limitations of evaluation and management by telemedicine and the availability of in person appointments. The patient expressed understanding and agreed to proceed.  Care Team   Patient Care Team: Briscoe Deutscher, DO as PCP - General (Family Medicine) Bo Merino, MD as Consulting Physician (Rheumatology) Monna Fam, MD as Consulting Physician (Ophthalmology) Amil Amen, MD as Referring Physician (Psychiatry) Marcial Pacas, MD as Consulting Physician (Neurology) [name], MD as Consulting Physician (Cardiothoracic Surgery) Brantley Fling, MD as Consulting Physician (Oncology) Curt Bears, MD as Consulting Physician (Oncology)  Subjective:   HPI:   Lymphoma, newly diagnosed T- lymphoblastic lymphoma presented with right upper lobe lung mass. Seen by oncology yesterday. They have placed a referral to Dr. Bjorn Pippin at [facility] for treatment.  She has been feeling fatigued and experiencing some chest discomfort. She has some stress because she feels like she is having to start over with a new physician but her faith in God is strong and is maintaining positivity. Her appetite has been good, she feels that she has even gained a little weight. She is wearing a mask on the rare occasion that she has been able to go out. She feels optimistic because she has had such great experiences  with all over the provider that she has been working with.   Vitamin D deficiency She has almost completed her prescription strength Vitamin D.   HTN She has been taking Irbesartan 150 mg.   B12 deficiency Has not started on B12 injection or supplement. She has been feeling more fatigued. Her daughter has experience giving injections working in [employer].   Review of Systems  Constitutional: Positive for malaise/fatigue. Negative for chills, fever and weight loss.  HENT: Negative for congestion.   Respiratory: Negative for cough.   Cardiovascular: Negative for chest pain and leg swelling.  Gastrointestinal: Negative for constipation, diarrhea, nausea and vomiting.  Genitourinary: Negative for dysuria.  Skin: Negative for rash.  Neurological: Negative for dizziness.  Psychiatric/Behavioral: Negative for depression.    Patient Active Problem List   Diagnosis Date Noted  . Mediastinal mass 02/14/2019  . Mass of upper lobe of right lung 01/20/2019  . B12 deficiency 12/19/2018  . Stress incontinence in female 12/19/2018  . Dyslipidemia 12/14/2018  . History of colitis 12/14/2018  . OSA (obstructive sleep apnea) 12/07/2018  . History of breast cancer 12/07/2018  . Paresthesia 03/04/2016  . False positive stress test 01/29/2015  . Migraine without aura and without status migrainosus, not intractable 12/18/2014  . Small vessel disease, cerebrovascular 12/18/2014  . GERD (gastroesophageal reflux disease) 12/02/2014  . Hypokalemia 11/29/2014  . Rebound headache 11/29/2014  . Hypertension 11/29/2014  . Hyperlipidemia 11/29/2014  . Anxiety 12/27/2013  . Depression 11/03/2012  . Elevated LFTs 11/03/2012  . Fibromyalgia 11/03/2012  . IBS (irritable bowel syndrome) 11/03/2012  . Lymphocytic colitis 11/03/2012  . Paget's disease of nipple ([place]) 11/03/2012  . Vitamin D deficiency 11/03/2012    Social History  Tobacco Use  . Smoking status: Never Smoker  . Smokeless tobacco:  Never Used  Substance Use Topics  . Alcohol use: No    Current Outpatient Medications:  .  acetaminophen (TYLENOL) 500 MG tablet, Take 2 tablets (1,000 mg total) by mouth every 6 (six) hours., Disp: 30 tablet, Rfl: 0 .  Ascorbic Acid (VITAMIN C) 1000 MG tablet, Take 1,000 mg by mouth daily., Disp: , Rfl:  .  aspirin EC 81 MG tablet, Take 81 mg by mouth daily., Disp: , Rfl:  .  atorvastatin (LIPITOR) 40 MG tablet, Take 40 mg by mouth at bedtime. , Disp: , Rfl:  .  Cholecalciferol 1.25 MG (50000 UT) TABS, 50,000 units PO qwk for 12 weeks., Disp: 12 tablet, Rfl: 0 .  cycloSPORINE (RESTASIS) 0.05 % ophthalmic emulsion, Place 1 drop into both eyes 2 (two) times daily., Disp: , Rfl:  .  gabapentin (NEURONTIN) 300 MG capsule, Take 300 mg by mouth at bedtime. , Disp: , Rfl:  .  irbesartan (AVAPRO) 150 MG tablet, Take 1 tablet (150 mg total) by mouth daily., Disp: 90 tablet, Rfl: 1 .  oxyCODONE (OXY IR/ROXICODONE) 5 MG immediate release tablet, Take 1 tablet (5 mg total) by mouth every 6 (six) hours as needed for severe pain., Disp: 10 tablet, Rfl: 0 .  PARoxetine (PAXIL) 20 MG tablet, Take 3 tablets (60 mg total) by mouth every morning., Disp: 270 tablet, Rfl: 0 .  senna-docusate (PERI-COLACE) 8.6-50 MG tablet, Take 1 tablet by mouth daily as needed for mild constipation. , Disp: , Rfl:  .  SUMAtriptan (IMITREX) 100 MG tablet, Take 1 tablet by mouth 2 (two) times daily as needed. At least 2 hrs between doses as needed bid, Disp: , Rfl:  .  cyanocobalamin (,VITAMIN B-12,) 1000 MCG/ML injection, Inject 1 mL once a week for 4 weeks and then once a monthly, Disp: 1 mL, Rfl: 3  No Known Allergies  Objective:   VITALS: Per patient if applicable, see vitals. GENERAL: Alert, appears well and in no acute distress. HEENT: Atraumatic, conjunctiva clear, no obvious abnormalities on inspection of external nose and ears. NECK: Normal movements of the head and neck. CARDIOPULMONARY: No increased WOB. Speaking  in clear sentences. I:E ratio WNL.  MS: Moves all visible extremities without noticeable abnormality. PSYCH: Pleasant and cooperative, well-groomed. Speech normal rate and rhythm. Affect is appropriate. Insight and judgement are appropriate. Attention is focused, linear, and appropriate.  NEURO: CN grossly intact. Oriented as arrived to appointment on time with no prompting. Moves both UE equally.  SKIN: No obvious lesions, wounds, erythema, or cyanosis noted on face or hands.  Depression screen [facility] 2/9 12/08/2018  Decreased Interest 0  Down, Depressed, Hopeless 0  PHQ - 2 Score 0  Altered sleeping 0  Tired, decreased energy 1  Change in appetite 0  Feeling bad or failure about yourself  0  Trouble concentrating 0  Moving slowly or fidgety/restless 0  Suicidal thoughts 0  PHQ-9 Score 1  Difficult doing work/chores Not difficult at all    Assessment and Plan:   Ambyr was seen today for follow-up and lymphoma.  Diagnoses and all orders for this visit:  Essential hypertension -     irbesartan (AVAPRO) 150 MG tablet; Take 1 tablet (150 mg total) by mouth daily.  Vitamin D deficiency -     Cholecalciferol 1.25 MG (50000 UT) TABS; 50,000 units PO qwk for 12 weeks.  B12 deficiency -     cyanocobalamin (,VITAMIN B-12,)  1000 MCG/ML injection; Inject 1 mL once a week for 4 weeks and then once a monthly   . COVID-19 Education: The signs and symptoms of COVID-19 were discussed with the patient and how to seek care for testing if needed. The importance of social distancing was discussed today. . Reviewed expectations re: course of current medical issues. . Discussed self-management of symptoms. . Outlined signs and symptoms indicating need for more acute intervention. . Patient verbalized understanding and all questions were answered. Marland Kitchen Health Maintenance issues including appropriate healthy diet, exercise, and smoking avoidance were discussed with patient. . See orders for this visit as  documented in the electronic medical record.  Briscoe Deutscher, DO  Records requested if needed. Time spent: 25 minutes, of which >50% was spent in obtaining information about her symptoms, reviewing her previous labs, evaluations, and treatments, counseling her about her condition (please see the discussed topics above), and developing a plan to further investigate it; she had a number of questions which I addressed.

## 2019-03-07 NOTE — Progress Notes (Signed)
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 female returns to the clinic today for follow-up visit.  The patient underwent right parasternal exploration with debridement and biopsy of the right anterior mediastinal mass under the care of Dr. Servando Snare on 02/14/2019.  The pathology was not clear and was sent for consultation with Dr. Kriste Basque at [facility] [place].  The final pathology returned back consistent with T- lymphoblastic lymphoma.  The patient is here today for evaluation and recommendation regarding treatment of her condition.  When seen today she is feeling fine with no concerning complaints except for soreness in the right side of the chest close to the surgical scar.  She denied having any shortness of breath except with exertion.  She denied having any fever or chills.  She has no nausea, vomiting, diarrhea or constipation.  She denied having any recent weight loss or night sweats.  MEDICAL HISTORY: Past Medical History:  Diagnosis Date   Anxiety    Arthritis    "joints" (11/29/2014)   Autoimmune disorder ([place])    "non-specific"   Cancer of left breast ([place])    Depression    Fibromyalgia    "some; not chronic" (11/29/2014)   GERD (gastroesophageal reflux disease)    Glaucoma of both eyes    Headache    "weekly" (11/29/2014)   Heart murmur    Hypercholesteremia    Hypertension    IBS (irritable bowel syndrome)    Mitral valve prolapse    NASH (nonalcoholic steatohepatitis)    Osteoarthritis    Pneumonia    PONV (postoperative nausea and vomiting)    Sleep apnea     ALLERGIES:  has No Known Allergies.  MEDICATIONS:  Current  Outpatient Medications  Medication Sig Dispense Refill   acetaminophen (TYLENOL) 500 MG tablet Take 2 tablets (1,000 mg total) by mouth every 6 (six) hours. 30 tablet 0   Ascorbic Acid (VITAMIN C) 1000 MG tablet Take 1,000 mg by mouth daily.     aspirin EC 81 MG tablet Take 81 mg by mouth daily.     atorvastatin (LIPITOR) 40 MG tablet Take 40 mg by mouth at bedtime.      Cholecalciferol 1.25 MG (50000 UT) TABS 50,000 units PO qwk for 12 weeks. (Patient not taking: Reported on 02/24/2019) 12 tablet 0   cycloSPORINE (RESTASIS) 0.05 % ophthalmic emulsion Place 1 drop into both eyes 2 (two) times daily.     gabapentin (NEURONTIN) 300 MG capsule Take 300 mg by mouth at bedtime.      irbesartan (AVAPRO) 75 MG tablet Take 1 tablet (75 mg total) by mouth daily. 30 tablet 1   oxyCODONE (OXY IR/ROXICODONE) 5 MG immediate release tablet Take 1 tablet (5 mg total) by mouth every 6 (six) hours as needed for severe pain. (Patient not taking: Reported on 02/24/2019) 10 tablet 0   PARoxetine (PAXIL) 20 MG tablet Take 3 tablets (60 mg total) by mouth every morning. 270 tablet 0   senna-docusate (PERI-COLACE) 8.6-50 MG tablet Take 1 tablet by mouth daily as needed for mild constipation.      SUMAtriptan (IMITREX) 100 MG tablet Take 1 tablet by mouth 2 (two) times daily as needed. At least 2  hrs between doses as needed bid     No current facility-administered medications for this visit.     SURGICAL HISTORY:  Past Surgical History:  Procedure Laterality Date   ABDOMINAL HYSTERECTOMY  1980   APPENDECTOMY  1953   BREAST BIOPSY Left    BREAST LUMPECTOMY Left    CATARACT EXTRACTION, BILATERAL  2018   MASTECTOMY Left ~ 2009   PARASTERNAL EXPLORATION Right 02/14/2019   Procedure: PARASTERNAL MEDIAL EXPLORATION WITH BIOPIES.;  Surgeon: [name], MD;  Location: [facility];  Service: Thoracic;  Laterality: Right;    REVIEW OF SYSTEMS:  Constitutional: positive for fatigue Eyes: negative Ears,  nose, mouth, throat, and face: negative Respiratory: positive for dyspnea on exertion and pleurisy/chest pain Cardiovascular: negative Gastrointestinal: negative Genitourinary:negative Integument/breast: negative Hematologic/lymphatic: negative Musculoskeletal:negative Neurological: negative Behavioral/Psych: negative Endocrine: negative Allergic/Immunologic: negative   PHYSICAL EXAMINATION: General appearance: alert, cooperative, fatigued and no distress Head: Normocephalic, without obvious abnormality, atraumatic Neck: no adenopathy, no JVD, supple, symmetrical, trachea midline and thyroid not enlarged, symmetric, no tenderness/mass/nodules Lymph nodes: Cervical, supraclavicular, and axillary nodes normal. Resp: clear to auscultation bilaterally Back: symmetric, no curvature. ROM normal. No CVA tenderness. Cardio: regular rate and rhythm, S1, S2 normal, no murmur, click, rub or gallop GI: soft, non-tender; bowel sounds normal; no masses,  no organomegaly Extremities: extremities normal, atraumatic, no cyanosis or edema Neurologic: Alert and oriented X 3, normal strength and tone. Normal symmetric reflexes. Normal coordination and gait  ECOG PERFORMANCE STATUS: 1 - Symptomatic but completely ambulatory  Blood pressure (!) 147/82, pulse 88, temperature 98.5 F (36.9 C), temperature source Oral, resp. rate 18, height 5' 3"  (1.6 m), weight 162 lb (73.5 kg), SpO2 97 %.  LABORATORY DATA: Lab Results  Component Value Date   WBC 4.9 03/07/2019   HGB 11.8 (L) 03/07/2019   HCT 36.2 03/07/2019   MCV 89.2 03/07/2019   PLT 239 03/07/2019      Chemistry      Component Value Date/Time   NA 140 03/07/2019 1034   NA 141 12/04/2017   K 4.7 03/07/2019 1034   CL 102 03/07/2019 1034   CO2 28 03/07/2019 1034   BUN 15 03/07/2019 1034   BUN 12 12/04/2017   CREATININE 0.80 03/07/2019 1034   GLU 94 12/04/2017      Component Value Date/Time   CALCIUM 9.9 03/07/2019 1034   ALKPHOS 100  03/07/2019 1034   AST 20 03/07/2019 1034   ALT 23 03/07/2019 1034   BILITOT 0.3 03/07/2019 1034       RADIOGRAPHIC STUDIES: Dg Chest 2 View  Result Date: 02/24/2019 CLINICAL DATA:  Mediastinal mass EXAM: CHEST - 2 VIEW COMPARISON:  Chest x-ray dated 04/29 2020. FINDINGS: The cardiac silhouette is not significantly enlarged. There is no pneumothorax. There is no large pleural effusion. No acute osseous abnormality detected. There is increased attenuation in the right perihilar region, likely representing the patient's known mediastinal mass. There is some pleural-parenchymal scarring at the right lung apex. IMPRESSION: 1. No acute cardiopulmonary process identified. 2. Fullness of the right perihilar region, likely related to the patient's known mediastinal mass or anterior chest wall mass. This can be further evaluated with a contrast-enhanced CT as clinically indicated. Electronically Signed   By: Constance Holster M.D.   On: 02/24/2019 13:27   Dg Chest 2 View  Result Date: 02/16/2019 CLINICAL DATA:  Pneumothorax. EXAM: CHEST - 2 VIEW COMPARISON:  One-view chest x-ray 02/15/2019 FINDINGS: The right-sided chest tube has been removed. There  is no new residual recurrent pneumothorax. The heart is mildly enlarged. Atherosclerotic changes are noted at the aortic arch. Left greater than right basilar airspace disease remains. Overall aeration is improved. IMPRESSION: 1. No significant pneumothorax following removal of chest tube. 2. Mild cardiomegaly without failure. 3. Left greater than right basilar airspace disease. This is improved. Findings likely represent atelectasis. Small left effusion is again noted. Electronically Signed   By: San Morelle M.D.   On: 02/16/2019 07:49   Dg Chest 2 View  Result Date: 02/14/2019 CLINICAL DATA:  Lung mass.  Preop for biopsy. EXAM: CHEST - 2 VIEW COMPARISON:  CT chest 12/29/2018.  Biopsy CT 01/31/2019. FINDINGS: The heart size is normal. Anterior right  middle lobe pleural-based lung mass is again noted. Pleural based disease the right apex is again noted. No other focal lesions are evident. There is no edema or effusion. The visualized soft tissues and bony thorax are unremarkable. IMPRESSION: 1. Anterior right middle lobe lung mass again noted. 2. Right apical pleural disease noted. 3. No acute cardiopulmonary disease. Electronically Signed   By: San Morelle M.D.   On: 02/14/2019 06:54   Dg Chest Port 1 View  Result Date: 02/15/2019 CLINICAL DATA:  Pneumothorax.  Chest pain. EXAM: PORTABLE CHEST 1 VIEW COMPARISON:  One-view chest x-ray 02/14/2019 FINDINGS: Previously noted right pneumothorax is no longer present. Right-sided chest tube remains in place. The heart is enlarged. A small left pleural effusion and associated airspace disease is stable. Mild pulmonary vascular congestion is stable. Lung volumes remain low. IMPRESSION: 1. No significant pneumothorax. Right-sided chest tube remains in place. 2. Postoperative volume loss in the right lung. 3. Stable small left pleural effusion and associated airspace disease, likely atelectasis. Electronically Signed   By: San Morelle M.D.   On: 02/15/2019 08:24   Dg Chest Port 1 View  Result Date: 02/14/2019 CLINICAL DATA:  Postop right lung mass biopsy for mid chest pain. Previous history of left breast cancer. EXAM: PORTABLE CHEST 1 VIEW COMPARISON:  02/14/2019 as well as PET-CT 01/31/2019 FINDINGS: Lungs are hypoinflated as the previously seen right mid lung mass is no longer visualized. There is mild opacification within the lung bases suggesting atelectasis and possible small amount left pleural fluid. Drain is present over the medial right base. No pneumothorax. Cardiomediastinal silhouette and remainder of the exam is unchanged. IMPRESSION: Previously seen right midlung mass no longer visualized. Bibasilar opacification likely atelectasis with small left effusion. Drainage catheter over  the medial right base. No pneumothorax. Electronically Signed   By: Marin Olp M.D.   On: 02/14/2019 11:02    ASSESSMENT AND PLAN: This is a very pleasant 73 years old white female recently diagnosed with T lymphoblastic lymphoma presented with hypermetabolic right thoracic mass with pericardial, pleural and mediastinal component. I had a lengthy discussion with the patient today about her recent diagnosis and possible treatment options. I recommended for this patient referral to Dr. Florene Glen at [facility] or 1 of his partner for treatment of this condition which would require a similar treatment to acute lymphoblastic lymphoma and unfortunately we will not be able to treat the patient locally at [facility]. I will make the referral to Dr. Florene Glen. The patient knows to call me if she has any concerning symptoms in the interval. The patient voices understanding of current disease status and treatment options and is in agreement with the current care plan. All questions were answered. The patient knows to call the clinic with  any problems, questions or concerns. We can certainly see the patient much sooner if necessary.  I spent 15 minutes counseling the patient face to face. The total time spent in the appointment was 25 minutes.  Disclaimer: This note was dictated with voice recognition software. Similar sounding words can inadvertently be transcribed and may not be corrected upon review.

## 2019-03-08 ENCOUNTER — Ambulatory Visit (INDEPENDENT_AMBULATORY_CARE_PROVIDER_SITE_OTHER): Payer: Medicare Other | Admitting: Family Medicine

## 2019-03-08 ENCOUNTER — Telehealth: Payer: Self-pay | Admitting: Internal Medicine

## 2019-03-08 DIAGNOSIS — I10 Essential (primary) hypertension: Secondary | ICD-10-CM

## 2019-03-08 DIAGNOSIS — E538 Deficiency of other specified B group vitamins: Secondary | ICD-10-CM | POA: Diagnosis not present

## 2019-03-08 DIAGNOSIS — E559 Vitamin D deficiency, unspecified: Secondary | ICD-10-CM

## 2019-03-08 MED ORDER — CYANOCOBALAMIN 1000 MCG/ML IJ SOLN: INTRAMUSCULAR | 3 refills | Status: DC

## 2019-03-08 MED ORDER — IRBESARTAN 150 MG PO TABS: 150.0000 mg | ORAL_TABLET | Freq: Every day | ORAL | 1 refills | Status: DC

## 2019-03-08 MED ORDER — CHOLECALCIFEROL 1.25 MG (50000 UT) PO TABS: ORAL_TABLET | ORAL | 0 refills | Status: DC

## 2019-03-08 NOTE — Telephone Encounter (Signed)
Faxed records to Dr. Florene Glen

## 2019-03-09 ENCOUNTER — Encounter: Payer: Self-pay | Admitting: *Deleted

## 2019-03-09 ENCOUNTER — Encounter: Payer: Self-pay | Admitting: Family Medicine

## 2019-03-09 NOTE — Progress Notes (Signed)
Oncology Nurse Navigator Documentation  Oncology Nurse Navigator Flowsheets 03/09/2019  Navigator Location [facility]  Referral date to RadOnc/MedOnc -  Navigator Encounter Type Other/I called managed care to check on patient's referral to [facility].  I was unable to reach [facility] but did leave a vm message to call me back.   Telephone -  Abnormal Finding Date -  Patient Visit Type -  Treatment Phase Pre-Tx/Tx Discussion  Barriers/Navigation Needs Coordination of Care  Education -  Interventions Coordination of Care  Coordination of Care Other  Education Method -  Acuity Level 1  Time Spent with Patient 15

## 2019-03-11 ENCOUNTER — Telehealth: Payer: Self-pay | Admitting: Family Medicine

## 2019-03-11 ENCOUNTER — Other Ambulatory Visit: Payer: Self-pay | Admitting: Family Medicine

## 2019-03-11 ENCOUNTER — Telehealth: Payer: Self-pay | Admitting: Medical Oncology

## 2019-03-11 NOTE — Telephone Encounter (Signed)
Copied from [place] [phone]. Topic: General - Other >> Mar 11, 2019  1:16 PM Pauline Good wrote: Reason for CRM: pt called and stated she was waiting on a call from staff to schedule a 7:30 appt so her daughter can come in a see how she is suppose to administer a B12 shot for her. Please advise pt

## 2019-03-11 NOTE — Telephone Encounter (Addendum)
Pt called about referral to [place] called [facility] and Dr Florene Glen has the referral . He is in clinic today . Margreta Journey will send a note to Dr Florene Glen. Pt updated.

## 2019-03-11 NOTE — Telephone Encounter (Signed)
Please call and schedule appointment.

## 2019-03-11 NOTE — Telephone Encounter (Signed)
Called pt and left VM to call the office.  

## 2019-03-11 NOTE — Telephone Encounter (Signed)
I called Dr Florene Glen office and he has not called his nurse back re pt. I  updated pt and gave her the number to call .

## 2019-03-15 ENCOUNTER — Telehealth: Payer: Self-pay | Admitting: Family Medicine

## 2019-03-15 LAB — FUNGUS CULTURE WITH STAIN

## 2019-03-15 LAB — FUNGAL ORGANISM REFLEX

## 2019-03-15 LAB — FUNGUS CULTURE RESULT

## 2019-03-15 NOTE — Telephone Encounter (Signed)
Pt called stating she was told her and her daughter could come in at 730 am during this week to be shown how to do B12 injection. Pt is coming tomorrow 03/16/2019 at 730am. Did not have anyway to add pt to schedule.

## 2019-03-16 ENCOUNTER — Telehealth: Payer: Self-pay | Admitting: *Deleted

## 2019-03-16 ENCOUNTER — Other Ambulatory Visit: Payer: Self-pay

## 2019-03-16 ENCOUNTER — Ambulatory Visit (INDEPENDENT_AMBULATORY_CARE_PROVIDER_SITE_OTHER): Payer: Medicare Other | Admitting: Family Medicine

## 2019-03-16 ENCOUNTER — Encounter: Payer: Self-pay | Admitting: Family Medicine

## 2019-03-16 DIAGNOSIS — E538 Deficiency of other specified B group vitamins: Secondary | ICD-10-CM

## 2019-03-16 MED ORDER — ATORVASTATIN CALCIUM 40 MG PO TABS: 40.0000 mg | ORAL_TABLET | Freq: Every day | ORAL | 1 refills | Status: DC

## 2019-03-16 MED ORDER — CYANOCOBALAMIN 1000 MCG/ML IJ SOLN
1000.0000 ug | Freq: Once | INTRAMUSCULAR | Status: AC
  Administered 2019-03-16: 1000 ug via INTRAMUSCULAR

## 2019-03-16 NOTE — Progress Notes (Signed)
Per orders of Dr. Juleen China, injection of B-12 given by Francella Solian in right deltoid. Patient tolerated injection well. This injections was to teach family how to give injections in future at home. Daughter gave injection as well as prepared with very little direction from me. She was also provided with hand out with instructions and locations for reference. If any issues she will call office

## 2019-03-16 NOTE — Telephone Encounter (Signed)
Receive vm message from [place] @ [facility] informing us that pt has an appt with Dr. Florene Glen on 03/18/19 @ 2:30 pm, arrival time @ 1:45 pm They will contact the patient with the above information.

## 2019-03-18 DIAGNOSIS — C9100 Acute lymphoblastic leukemia not having achieved remission: Secondary | ICD-10-CM | POA: Diagnosis not present

## 2019-03-18 DIAGNOSIS — C50919 Malignant neoplasm of unspecified site of unspecified female breast: Secondary | ICD-10-CM | POA: Insufficient documentation

## 2019-03-18 DIAGNOSIS — Z1379 Encounter for other screening for genetic and chromosomal anomalies: Secondary | ICD-10-CM | POA: Diagnosis not present

## 2019-03-18 DIAGNOSIS — D649 Anemia, unspecified: Secondary | ICD-10-CM | POA: Diagnosis not present

## 2019-03-18 DIAGNOSIS — I10 Essential (primary) hypertension: Secondary | ICD-10-CM | POA: Diagnosis not present

## 2019-03-18 DIAGNOSIS — Z79899 Other long term (current) drug therapy: Secondary | ICD-10-CM | POA: Diagnosis not present

## 2019-03-18 DIAGNOSIS — C8350 Lymphoblastic (diffuse) lymphoma, unspecified site: Secondary | ICD-10-CM | POA: Diagnosis not present

## 2019-03-18 DIAGNOSIS — Z853 Personal history of malignant neoplasm of breast: Secondary | ICD-10-CM | POA: Diagnosis not present

## 2019-03-18 DIAGNOSIS — E785 Hyperlipidemia, unspecified: Secondary | ICD-10-CM | POA: Diagnosis not present

## 2019-03-18 DIAGNOSIS — D499 Neoplasm of unspecified behavior of unspecified site: Secondary | ICD-10-CM | POA: Diagnosis not present

## 2019-03-18 DIAGNOSIS — C9150 Adult T-cell lymphoma/leukemia (HTLV-1-associated) not having achieved remission: Secondary | ICD-10-CM | POA: Diagnosis not present

## 2019-03-21 ENCOUNTER — Telehealth: Payer: Self-pay | Admitting: *Deleted

## 2019-03-21 NOTE — Telephone Encounter (Signed)
Oncology Nurse Navigator Documentation  Oncology Nurse Navigator Flowsheets 03/21/2019  Navigator Location [facility]  Referral date to RadOnc/MedOnc -  Navigator Encounter Type Telephone/I called patient to follow up on her appt with [facility].  She states she saw them last week and will be going into the hospital for 3 weeks.  I offered support and encouragement.  I will update Dr. Julien Nordmann and Dr. Servando Snare.   Telephone Outgoing Call  Abnormal Finding Date 12/29/2018  Confirmed Diagnosis Date 02/14/2019  Patient Visit Type Follow-up  Treatment Phase Pre-Tx/Tx Discussion  Barriers/Navigation Needs Education  Education Other  Interventions Education;Other  Coordination of Care -  Education Method Verbal  Acuity Level 2  Time Spent with Patient 30

## 2019-03-22 DIAGNOSIS — C383 Malignant neoplasm of mediastinum, part unspecified: Secondary | ICD-10-CM | POA: Diagnosis not present

## 2019-03-22 DIAGNOSIS — Z7982 Long term (current) use of aspirin: Secondary | ICD-10-CM | POA: Diagnosis not present

## 2019-03-22 DIAGNOSIS — Z9012 Acquired absence of left breast and nipple: Secondary | ICD-10-CM | POA: Diagnosis not present

## 2019-03-22 DIAGNOSIS — I10 Essential (primary) hypertension: Secondary | ICD-10-CM | POA: Diagnosis not present

## 2019-03-22 DIAGNOSIS — J95811 Postprocedural pneumothorax: Secondary | ICD-10-CM | POA: Diagnosis not present

## 2019-03-22 DIAGNOSIS — J9811 Atelectasis: Secondary | ICD-10-CM | POA: Diagnosis not present

## 2019-03-22 DIAGNOSIS — K589 Irritable bowel syndrome without diarrhea: Secondary | ICD-10-CM | POA: Diagnosis not present

## 2019-03-22 DIAGNOSIS — D4989 Neoplasm of unspecified behavior of other specified sites: Secondary | ICD-10-CM | POA: Insufficient documentation

## 2019-03-22 DIAGNOSIS — J9601 Acute respiratory failure with hypoxia: Secondary | ICD-10-CM | POA: Diagnosis not present

## 2019-03-22 DIAGNOSIS — E538 Deficiency of other specified B group vitamins: Secondary | ICD-10-CM | POA: Diagnosis not present

## 2019-03-22 DIAGNOSIS — C37 Malignant neoplasm of thymus: Secondary | ICD-10-CM | POA: Diagnosis not present

## 2019-03-22 DIAGNOSIS — Z79899 Other long term (current) drug therapy: Secondary | ICD-10-CM | POA: Diagnosis not present

## 2019-03-22 DIAGNOSIS — F419 Anxiety disorder, unspecified: Secondary | ICD-10-CM | POA: Diagnosis not present

## 2019-03-22 DIAGNOSIS — I493 Ventricular premature depolarization: Secondary | ICD-10-CM | POA: Diagnosis not present

## 2019-03-22 DIAGNOSIS — E785 Hyperlipidemia, unspecified: Secondary | ICD-10-CM | POA: Diagnosis not present

## 2019-03-22 DIAGNOSIS — D150 Benign neoplasm of thymus: Secondary | ICD-10-CM | POA: Diagnosis present

## 2019-03-22 DIAGNOSIS — I351 Nonrheumatic aortic (valve) insufficiency: Secondary | ICD-10-CM | POA: Diagnosis present

## 2019-03-22 DIAGNOSIS — R918 Other nonspecific abnormal finding of lung field: Secondary | ICD-10-CM | POA: Diagnosis not present

## 2019-03-22 DIAGNOSIS — G4733 Obstructive sleep apnea (adult) (pediatric): Secondary | ICD-10-CM | POA: Diagnosis present

## 2019-03-22 DIAGNOSIS — J9859 Other diseases of mediastinum, not elsewhere classified: Secondary | ICD-10-CM | POA: Diagnosis not present

## 2019-03-22 DIAGNOSIS — J9383 Other pneumothorax: Secondary | ICD-10-CM | POA: Diagnosis not present

## 2019-03-22 DIAGNOSIS — G43909 Migraine, unspecified, not intractable, without status migrainosus: Secondary | ICD-10-CM | POA: Diagnosis present

## 2019-03-22 DIAGNOSIS — Z853 Personal history of malignant neoplasm of breast: Secondary | ICD-10-CM | POA: Diagnosis not present

## 2019-03-22 DIAGNOSIS — N281 Cyst of kidney, acquired: Secondary | ICD-10-CM | POA: Diagnosis not present

## 2019-03-22 DIAGNOSIS — E8770 Fluid overload, unspecified: Secondary | ICD-10-CM | POA: Diagnosis not present

## 2019-03-22 DIAGNOSIS — C9100 Acute lymphoblastic leukemia not having achieved remission: Secondary | ICD-10-CM | POA: Diagnosis not present

## 2019-03-22 DIAGNOSIS — Z1159 Encounter for screening for other viral diseases: Secondary | ICD-10-CM | POA: Diagnosis not present

## 2019-03-22 DIAGNOSIS — J90 Pleural effusion, not elsewhere classified: Secondary | ICD-10-CM | POA: Diagnosis not present

## 2019-03-23 LAB — NOVEL CORONAVIRUS, NAA: SARS-CoV-2, NAA: NEGATIVE

## 2019-03-23 LAB — CBC AND DIFFERENTIAL
HCT: 30 — AB (ref 36–46)
Hemoglobin: 10.8 — AB (ref 12.0–16.0)
Platelets: 200 (ref 150–399)
WBC: 4.5

## 2019-03-23 LAB — BASIC METABOLIC PANEL
BUN: 15 (ref 4–21)
Creatinine: 0.7 (ref 0.5–1.1)
Glucose: 109
Potassium: 4 (ref 3.4–5.3)
Sodium: 139 (ref 137–147)

## 2019-03-23 LAB — HEPATIC FUNCTION PANEL
ALT: 19 (ref 7–35)
AST: 14 (ref 13–35)
Alkaline Phosphatase: 86 (ref 25–125)
Bilirubin, Total: 0.3

## 2019-03-23 LAB — VITAMIN B12: Vitamin B-12: 298

## 2019-03-29 LAB — ACID FAST CULTURE WITH REFLEXED SENSITIVITIES (MYCOBACTERIA): Acid Fast Culture: NEGATIVE

## 2019-04-01 DIAGNOSIS — D150 Benign neoplasm of thymus: Secondary | ICD-10-CM | POA: Diagnosis not present

## 2019-04-01 DIAGNOSIS — Z7189 Other specified counseling: Secondary | ICD-10-CM | POA: Diagnosis not present

## 2019-04-01 DIAGNOSIS — C37 Malignant neoplasm of thymus: Secondary | ICD-10-CM | POA: Diagnosis not present

## 2019-04-02 ENCOUNTER — Other Ambulatory Visit: Payer: Self-pay | Admitting: Family Medicine

## 2019-04-04 ENCOUNTER — Encounter: Payer: Self-pay | Admitting: Physician Assistant

## 2019-04-04 DIAGNOSIS — D4989 Neoplasm of unspecified behavior of other specified sites: Secondary | ICD-10-CM

## 2019-04-04 NOTE — Telephone Encounter (Signed)
Rx request Last fill 01/30/19 #270/0 Last ov 03/08/19

## 2019-04-05 ENCOUNTER — Telehealth: Payer: Self-pay | Admitting: Family Medicine

## 2019-04-05 ENCOUNTER — Other Ambulatory Visit: Payer: Self-pay

## 2019-04-05 MED ORDER — ATORVASTATIN CALCIUM 40 MG PO TABS: 40.0000 mg | ORAL_TABLET | Freq: Every day | ORAL | 1 refills | Status: DC

## 2019-04-05 NOTE — Telephone Encounter (Signed)
Pt stated she received a letter from [facility] stating they need a new rx for her atorvastatin (LIPITOR) 40 MG tablet Before they will continue filling it. Please advise.

## 2019-04-05 NOTE — Telephone Encounter (Signed)
Refill sent in

## 2019-04-11 DIAGNOSIS — C388 Malignant neoplasm of overlapping sites of heart, mediastinum and pleura: Secondary | ICD-10-CM | POA: Diagnosis not present

## 2019-04-11 DIAGNOSIS — Z79899 Other long term (current) drug therapy: Secondary | ICD-10-CM | POA: Diagnosis not present

## 2019-04-11 DIAGNOSIS — D150 Benign neoplasm of thymus: Secondary | ICD-10-CM | POA: Diagnosis not present

## 2019-04-12 DIAGNOSIS — Z5111 Encounter for antineoplastic chemotherapy: Secondary | ICD-10-CM | POA: Diagnosis not present

## 2019-04-12 DIAGNOSIS — C37 Malignant neoplasm of thymus: Secondary | ICD-10-CM | POA: Diagnosis not present

## 2019-04-13 DIAGNOSIS — C50011 Malignant neoplasm of nipple and areola, right female breast: Secondary | ICD-10-CM | POA: Diagnosis not present

## 2019-04-13 DIAGNOSIS — Z79899 Other long term (current) drug therapy: Secondary | ICD-10-CM | POA: Diagnosis not present

## 2019-04-13 DIAGNOSIS — Z5111 Encounter for antineoplastic chemotherapy: Secondary | ICD-10-CM | POA: Diagnosis not present

## 2019-04-13 DIAGNOSIS — D150 Benign neoplasm of thymus: Secondary | ICD-10-CM | POA: Diagnosis not present

## 2019-04-29 DIAGNOSIS — R918 Other nonspecific abnormal finding of lung field: Secondary | ICD-10-CM | POA: Diagnosis not present

## 2019-04-29 DIAGNOSIS — Z9889 Other specified postprocedural states: Secondary | ICD-10-CM | POA: Diagnosis not present

## 2019-04-29 DIAGNOSIS — Z48813 Encounter for surgical aftercare following surgery on the respiratory system: Secondary | ICD-10-CM | POA: Diagnosis not present

## 2019-05-02 DIAGNOSIS — Z79899 Other long term (current) drug therapy: Secondary | ICD-10-CM | POA: Diagnosis not present

## 2019-05-02 DIAGNOSIS — C37 Malignant neoplasm of thymus: Secondary | ICD-10-CM | POA: Diagnosis not present

## 2019-05-02 DIAGNOSIS — Z5111 Encounter for antineoplastic chemotherapy: Secondary | ICD-10-CM | POA: Diagnosis not present

## 2019-05-02 DIAGNOSIS — C781 Secondary malignant neoplasm of mediastinum: Secondary | ICD-10-CM | POA: Diagnosis not present

## 2019-05-03 DIAGNOSIS — C73 Malignant neoplasm of thyroid gland: Secondary | ICD-10-CM | POA: Diagnosis not present

## 2019-05-03 DIAGNOSIS — Z5111 Encounter for antineoplastic chemotherapy: Secondary | ICD-10-CM | POA: Diagnosis not present

## 2019-05-04 DIAGNOSIS — Z5111 Encounter for antineoplastic chemotherapy: Secondary | ICD-10-CM | POA: Diagnosis not present

## 2019-05-04 DIAGNOSIS — C73 Malignant neoplasm of thyroid gland: Secondary | ICD-10-CM | POA: Diagnosis not present

## 2019-05-23 DIAGNOSIS — K219 Gastro-esophageal reflux disease without esophagitis: Secondary | ICD-10-CM | POA: Diagnosis not present

## 2019-05-23 DIAGNOSIS — C37 Malignant neoplasm of thymus: Secondary | ICD-10-CM | POA: Diagnosis not present

## 2019-05-23 DIAGNOSIS — I771 Stricture of artery: Secondary | ICD-10-CM | POA: Diagnosis not present

## 2019-05-23 DIAGNOSIS — K449 Diaphragmatic hernia without obstruction or gangrene: Secondary | ICD-10-CM | POA: Diagnosis not present

## 2019-05-23 DIAGNOSIS — D150 Benign neoplasm of thymus: Secondary | ICD-10-CM | POA: Diagnosis not present

## 2019-05-23 DIAGNOSIS — K760 Fatty (change of) liver, not elsewhere classified: Secondary | ICD-10-CM | POA: Diagnosis not present

## 2019-05-23 DIAGNOSIS — R918 Other nonspecific abnormal finding of lung field: Secondary | ICD-10-CM | POA: Diagnosis not present

## 2019-05-23 DIAGNOSIS — Z5111 Encounter for antineoplastic chemotherapy: Secondary | ICD-10-CM | POA: Diagnosis not present

## 2019-05-23 DIAGNOSIS — S2231XD Fracture of one rib, right side, subsequent encounter for fracture with routine healing: Secondary | ICD-10-CM | POA: Diagnosis not present

## 2019-05-24 DIAGNOSIS — C37 Malignant neoplasm of thymus: Secondary | ICD-10-CM | POA: Diagnosis not present

## 2019-05-24 DIAGNOSIS — Z5111 Encounter for antineoplastic chemotherapy: Secondary | ICD-10-CM | POA: Diagnosis not present

## 2019-05-25 ENCOUNTER — Other Ambulatory Visit: Payer: Self-pay | Admitting: Family Medicine

## 2019-05-25 DIAGNOSIS — C37 Malignant neoplasm of thymus: Secondary | ICD-10-CM | POA: Diagnosis not present

## 2019-05-25 DIAGNOSIS — E538 Deficiency of other specified B group vitamins: Secondary | ICD-10-CM

## 2019-05-25 DIAGNOSIS — Z5111 Encounter for antineoplastic chemotherapy: Secondary | ICD-10-CM | POA: Diagnosis not present

## 2019-06-05 ENCOUNTER — Other Ambulatory Visit: Payer: Self-pay | Admitting: Family Medicine

## 2019-06-13 DIAGNOSIS — D150 Benign neoplasm of thymus: Secondary | ICD-10-CM | POA: Diagnosis not present

## 2019-06-13 DIAGNOSIS — Z79899 Other long term (current) drug therapy: Secondary | ICD-10-CM | POA: Diagnosis not present

## 2019-06-13 DIAGNOSIS — C37 Malignant neoplasm of thymus: Secondary | ICD-10-CM | POA: Diagnosis not present

## 2019-06-14 DIAGNOSIS — C37 Malignant neoplasm of thymus: Secondary | ICD-10-CM | POA: Diagnosis not present

## 2019-06-14 DIAGNOSIS — Z5111 Encounter for antineoplastic chemotherapy: Secondary | ICD-10-CM | POA: Diagnosis not present

## 2019-06-15 DIAGNOSIS — Z79899 Other long term (current) drug therapy: Secondary | ICD-10-CM | POA: Diagnosis not present

## 2019-06-15 DIAGNOSIS — D150 Benign neoplasm of thymus: Secondary | ICD-10-CM | POA: Diagnosis not present

## 2019-06-15 DIAGNOSIS — Z5189 Encounter for other specified aftercare: Secondary | ICD-10-CM | POA: Diagnosis not present

## 2019-06-20 DIAGNOSIS — D150 Benign neoplasm of thymus: Secondary | ICD-10-CM | POA: Diagnosis not present

## 2019-06-20 DIAGNOSIS — Z853 Personal history of malignant neoplasm of breast: Secondary | ICD-10-CM | POA: Diagnosis not present

## 2019-06-28 DIAGNOSIS — C37 Malignant neoplasm of thymus: Secondary | ICD-10-CM | POA: Diagnosis not present

## 2019-06-28 DIAGNOSIS — Z853 Personal history of malignant neoplasm of breast: Secondary | ICD-10-CM | POA: Diagnosis not present

## 2019-06-29 ENCOUNTER — Other Ambulatory Visit: Payer: Self-pay | Admitting: Family Medicine

## 2019-07-04 DIAGNOSIS — D150 Benign neoplasm of thymus: Secondary | ICD-10-CM | POA: Diagnosis not present

## 2019-07-04 DIAGNOSIS — C37 Malignant neoplasm of thymus: Secondary | ICD-10-CM | POA: Diagnosis not present

## 2019-07-04 DIAGNOSIS — Z79899 Other long term (current) drug therapy: Secondary | ICD-10-CM | POA: Diagnosis not present

## 2019-07-04 DIAGNOSIS — Z853 Personal history of malignant neoplasm of breast: Secondary | ICD-10-CM | POA: Diagnosis not present

## 2019-07-04 DIAGNOSIS — Z5111 Encounter for antineoplastic chemotherapy: Secondary | ICD-10-CM | POA: Diagnosis not present

## 2019-07-05 DIAGNOSIS — Z5111 Encounter for antineoplastic chemotherapy: Secondary | ICD-10-CM | POA: Diagnosis not present

## 2019-07-05 DIAGNOSIS — D150 Benign neoplasm of thymus: Secondary | ICD-10-CM | POA: Diagnosis not present

## 2019-07-06 DIAGNOSIS — Z79899 Other long term (current) drug therapy: Secondary | ICD-10-CM | POA: Diagnosis not present

## 2019-07-06 DIAGNOSIS — C37 Malignant neoplasm of thymus: Secondary | ICD-10-CM | POA: Diagnosis not present

## 2019-07-18 ENCOUNTER — Telehealth: Payer: Self-pay

## 2019-07-18 NOTE — Telephone Encounter (Signed)
Autumn Conley, can pt come in early morning for you to give flu shots on one of these days? Please advise.   Copied from [place] [phone]. Topic: Appointment Scheduling - Scheduling Inquiry for Clinic >> Jul 18, 2019 10:55 AM Nils Flack wrote: Reason for CRM: pt called to see if she can come in to flu shot really early to avoid the crowds.  She is a cancer pt and does not want to be around crowds.  She has gotten the ok to get her flu shot from her oncologist.  She would also like her husband [name] 12/06/44 and daughter [name] 02/22/63 to come at the same time.  She is asking for next Thursday or Friday

## 2019-07-19 NOTE — Telephone Encounter (Signed)
Please call patient back I am willing to do on Thursday early am but has to be after sam gets here. Have to have provider in office. Just let me know day/time think she does not get here until 730am

## 2019-07-20 ENCOUNTER — Telehealth: Payer: Self-pay

## 2019-07-20 NOTE — Telephone Encounter (Signed)
Pt made flu shot appt for oct 8th and would like to know if she needs to have labs done at this time for b12 and vit D at this time before she takes anymore of the b12 injections. Please advise.

## 2019-07-22 NOTE — Telephone Encounter (Signed)
I spoke with pt and she wanted to know if she would need a lab for Vit D and B12 due to her chemotherapy.  Patient explains that she is on nurse schedule for a flu vaccine along with her daughter and husband, (Please see TE from 07/18/19 about the time).  Pt wants to know should she continue to take B12 injection as her pharmacy has sent another round and/if she should continue on the Vit D Rx and if so would you call in another Rx for it.

## 2019-07-22 NOTE — Telephone Encounter (Signed)
That is a great idea. Let's get CBC, CMP, mag, B12, vitamin D.

## 2019-07-25 DIAGNOSIS — R0602 Shortness of breath: Secondary | ICD-10-CM | POA: Diagnosis not present

## 2019-07-25 DIAGNOSIS — Z853 Personal history of malignant neoplasm of breast: Secondary | ICD-10-CM | POA: Diagnosis not present

## 2019-07-25 DIAGNOSIS — D4989 Neoplasm of unspecified behavior of other specified sites: Secondary | ICD-10-CM | POA: Diagnosis not present

## 2019-07-25 DIAGNOSIS — R222 Localized swelling, mass and lump, trunk: Secondary | ICD-10-CM | POA: Diagnosis not present

## 2019-07-25 DIAGNOSIS — Z79899 Other long term (current) drug therapy: Secondary | ICD-10-CM | POA: Diagnosis not present

## 2019-07-26 ENCOUNTER — Other Ambulatory Visit: Payer: Self-pay

## 2019-07-26 DIAGNOSIS — Z5111 Encounter for antineoplastic chemotherapy: Secondary | ICD-10-CM | POA: Diagnosis not present

## 2019-07-26 DIAGNOSIS — R5383 Other fatigue: Secondary | ICD-10-CM

## 2019-07-26 DIAGNOSIS — D4989 Neoplasm of unspecified behavior of other specified sites: Secondary | ICD-10-CM | POA: Diagnosis not present

## 2019-07-26 DIAGNOSIS — E538 Deficiency of other specified B group vitamins: Secondary | ICD-10-CM

## 2019-07-26 DIAGNOSIS — E559 Vitamin D deficiency, unspecified: Secondary | ICD-10-CM

## 2019-07-26 NOTE — Telephone Encounter (Signed)
LDM for pt informing her that Dr. Juleen China agrees with getting lab work done.  Pt on lab schedule 07/28/19 and also will receive a flu shot same day.

## 2019-07-27 DIAGNOSIS — Z79899 Other long term (current) drug therapy: Secondary | ICD-10-CM | POA: Diagnosis not present

## 2019-07-27 DIAGNOSIS — D4989 Neoplasm of unspecified behavior of other specified sites: Secondary | ICD-10-CM | POA: Diagnosis not present

## 2019-07-27 DIAGNOSIS — Z5111 Encounter for antineoplastic chemotherapy: Secondary | ICD-10-CM | POA: Diagnosis not present

## 2019-07-28 ENCOUNTER — Other Ambulatory Visit: Payer: Self-pay

## 2019-07-28 ENCOUNTER — Encounter: Payer: Self-pay | Admitting: Family Medicine

## 2019-07-28 ENCOUNTER — Ambulatory Visit (INDEPENDENT_AMBULATORY_CARE_PROVIDER_SITE_OTHER): Payer: Medicare Other

## 2019-07-28 ENCOUNTER — Other Ambulatory Visit (INDEPENDENT_AMBULATORY_CARE_PROVIDER_SITE_OTHER): Payer: Medicare Other

## 2019-07-28 DIAGNOSIS — E538 Deficiency of other specified B group vitamins: Secondary | ICD-10-CM | POA: Diagnosis not present

## 2019-07-28 DIAGNOSIS — Z23 Encounter for immunization: Secondary | ICD-10-CM | POA: Diagnosis not present

## 2019-07-28 DIAGNOSIS — R5383 Other fatigue: Secondary | ICD-10-CM

## 2019-07-28 DIAGNOSIS — E559 Vitamin D deficiency, unspecified: Secondary | ICD-10-CM | POA: Diagnosis not present

## 2019-07-28 LAB — CBC WITH DIFFERENTIAL/PLATELET
Basophils Absolute: 0.1 10*3/uL (ref 0.0–0.1)
Basophils Relative: 1 % (ref 0.0–3.0)
Eosinophils Absolute: 0 10*3/uL (ref 0.0–0.7)
Eosinophils Relative: 0 % (ref 0.0–5.0)
HCT: 26.8 % — ABNORMAL LOW (ref 36.0–46.0)
Hemoglobin: 9.1 g/dL — ABNORMAL LOW (ref 12.0–15.0)
Lymphocytes Relative: 10 % — ABNORMAL LOW (ref 12.0–46.0)
Lymphs Abs: 1.1 10*3/uL (ref 0.7–4.0)
MCHC: 33.9 g/dL (ref 30.0–36.0)
MCV: 100.2 fl — ABNORMAL HIGH (ref 78.0–100.0)
Monocytes Absolute: 0.8 10*3/uL (ref 0.1–1.0)
Monocytes Relative: 7.7 % (ref 3.0–12.0)
Neutro Abs: 8.7 10*3/uL — ABNORMAL HIGH (ref 1.4–7.7)
Neutrophils Relative %: 81.3 % — ABNORMAL HIGH (ref 43.0–77.0)
Platelets: 324 10*3/uL (ref 150.0–400.0)
RBC: 2.64 Mil/uL — ABNORMAL LOW (ref 3.87–5.11)
RDW: 21.5 % — ABNORMAL HIGH (ref 11.5–15.5)
WBC: 10.7 10*3/uL — ABNORMAL HIGH (ref 4.0–10.5)

## 2019-07-28 LAB — COMPREHENSIVE METABOLIC PANEL
ALT: 35 U/L (ref 0–35)
AST: 18 U/L (ref 0–37)
Albumin: 4.5 g/dL (ref 3.5–5.2)
Alkaline Phosphatase: 111 U/L (ref 39–117)
BUN: 19 mg/dL (ref 6–23)
CO2: 29 mEq/L (ref 19–32)
Calcium: 10.1 mg/dL (ref 8.4–10.5)
Chloride: 100 mEq/L (ref 96–112)
Creatinine, Ser: 0.74 mg/dL (ref 0.40–1.20)
GFR: 76.73 mL/min (ref >60.00)
Glucose, Bld: 85 mg/dL (ref 70–99)
Potassium: 4.5 mEq/L (ref 3.5–5.1)
Sodium: 137 mEq/L (ref 135–145)
Total Bilirubin: 0.4 mg/dL (ref 0.2–1.2)
Total Protein: 6.5 g/dL (ref 6.0–8.3)

## 2019-07-28 LAB — VITAMIN D 25 HYDROXY (VIT D DEFICIENCY, FRACTURES): VITD: 38.37 ng/mL (ref 30.00–100.00)

## 2019-07-28 LAB — VITAMIN B12: Vitamin B-12: >1500 pg/mL — ABNORMAL HIGH (ref 211–911)

## 2019-08-11 ENCOUNTER — Other Ambulatory Visit: Payer: Self-pay

## 2019-08-11 ENCOUNTER — Ambulatory Visit (INDEPENDENT_AMBULATORY_CARE_PROVIDER_SITE_OTHER): Payer: Medicare Other | Admitting: Family Medicine

## 2019-08-11 ENCOUNTER — Encounter: Payer: Self-pay | Admitting: Family Medicine

## 2019-08-11 VITALS — BP 130/64 | HR 97 | Temp 97.6°F | Resp 16 | Wt 167.6 lb

## 2019-08-11 DIAGNOSIS — I808 Phlebitis and thrombophlebitis of other sites: Secondary | ICD-10-CM | POA: Diagnosis not present

## 2019-08-11 MED ORDER — CEPHALEXIN 500 MG PO CAPS: 500.0000 mg | ORAL_CAPSULE | Freq: Two times a day (BID) | ORAL | 0 refills | Status: DC

## 2019-08-11 NOTE — Patient Instructions (Signed)
Please return in 3 months for [facility] visit with me.   Use warm compresses to sore reddened areas 2-3 x/ day. Take one aspirin daily for up to a week.  Complete the antibiotic course.  Call your cancer doctor to let them know what is going on.  Take pictures of it to show them on Monday.   If you have any questions or concerns, please don't hesitate to send me a message via MyChart or call the office at [phone]. Thank you for visiting with [place] today! It's our pleasure caring for you.   Thrombophlebitis Thrombophlebitis is a condition in which a blood clot forms in a vein. This can happen in your arms or legs, or in the area between your neck and groin (torso). When this condition happens in a vein that is close to the surface of the body (superficial thrombophlebitis), it is usually not serious.However, when the condition happens in a vein that is deep inside the body (deep vein thrombosis, DVT), it can cause serious problems. What are the causes? This condition may be caused by:  Damage to a vein.  Inflammation of the veins.  A condition that causes blood to clot more easily.  Reduced blood flow through the veins. What increases the risk? The following factors may make you more likely to develop this condition:  Having a condition that makes blood thicker or more likely to clot.  Having an infection.  Having major surgery.  Experiencing a traumatic injury or a broken bone.  Having a catheter in a vein (central line).  Having a condition in which valves in the veins do not work properly, causing blood to collect (pool) in the veins (chronic venous insufficiency).  An inactive (sedentary) lifestyle.  Pregnancy or having recently given birth.  Cancer.  Older age, especially being 39 or older.  Obesity.  Smoking.  Taking medicines that contain estrogen, such as birth control pills.  Having varicose veins.  Using drugs that are injected into the veins (intravenous,  IV). What are the signs or symptoms? The main symptoms of this condition are:  Swelling and pain in an arm or leg. If the affected vein is in the leg, you may feel pain while standing or walking.  Warmth or redness in an arm or leg. Other symptoms include:  Low-grade fever.  Muscle aches.  A bulging vein (venous distension). In some cases, there are no symptoms. How is this diagnosed? This condition may be diagnosed based on:  Your symptoms and medical history.  A physical exam.  Tests, such as: ? Blood tests. ? A test that uses sound waves to make images (ultrasound). How is this treated? Treatment depends on how severe the condition is and which area of the body is affected. Treatment may include:  Applying a warm compress or heating pad to affected areas.  Wearing compression stockings to help prevent blood clots and reduce swelling in your legs.  Raising (elevating) the affected arm or leg above the level of your heart.  Medicines, such as: ? Anti-inflammatory medicines, such as ibuprofen. ? Blood thinners (anticoagulants), such as heparin. ? Antibiotic medicine, if you have an infection.  Removing an IV that may be causing the problem. In rare cases, surgery may be needed to:  Remove a damaged section of a vein.  Place a filter in a large vein to catch blood clots before they reach the lungs. Follow these instructions at home: Medicines  Take over-the-counter and prescription medicines only as told  by your health care provider.  If you were prescribed an antibiotic, take it as told by your health care provider. Do not stop using the antibiotic even if you feel better. Managing pain, stiffness, and swelling   If directed, put heat on the affected area as often as told by your health care provider. Use the heat source that your health care provider recommends, such as a moist heat pack or a heating pad. ? Place a towel between your skin and the heat source.  ? Leave the heat on for 20-30 minutes. ? Remove the heat if your skin turns bright red. This is especially important if you are not able to feel pain, heat, or cold. You may have a greater risk of getting burned.  Elevate the affected area above the level of your heart while you are sitting or lying down. Activity  Return to your normal activities as told by your health care provider. Ask your health care provider what activities are safe for you.  Avoid sitting or lying down for long periods. If possible, stand up and walk around regularly. If you are taking blood thinners:  Take your medicine exactly as told, at the same time every day.  Avoid activities that could cause injury or bruising, and follow instructions about how to prevent falls.  Wear a medical alert bracelet or carry a card that lists what medicines you take. General instructions  Drink enough fluid to keep your urine pale yellow.  Wear compression stockings as told by your health care provider.  Do not use any products that contain nicotine or tobacco, such as cigarettes and e-cigarettes. If you need help quitting, ask your health care provider.  Keep all follow-up visits as told by your health care provider. This is important. Contact a health care provider if:  You miss a dose of your blood thinner, if applicable.  Your symptoms do not improve.  You have unusual bruising.  You have nausea, vomiting, or diarrhea that lasts for more than one day. Get help right away if:  You have any of these problems: ? New or worse pain, swelling, or redness in an arm or leg. ? Numbness or tingling in an arm or leg. ? Shortness of breath. ? Chest pain. ? Severe pain in your abdomen. ? Fast breathing. ? A fast or irregular heartbeat. ? Blood in your vomit, stool, or urine. ? A severe headache or confusion. ? A cut that does not stop bleeding.  You feel light-headed or dizzy.  You cough up blood.  You have a  serious fall or accident, or you hit your head. These symptoms may represent a serious problem that is an emergency. Do not wait to see if the symptoms will go away. Get medical help right away. Call your local emergency services (911 in the [place] Do not drive yourself to the hospital. Summary  Thrombophlebitis is a condition in which a blood clot forms in a vein. This can happen in a vein close to the surface of the body or a vein deep inside the body.  This condition can cause serious problems when it happens in a vein deep inside the body (deep vein thrombosis, DVT).  The main symptom of this condition is swelling and pain around the affected vein.  Treatment may include warm compresses, anti-inflammatory medicines, or blood thinners. This information is not intended to replace advice given to you by your health care provider. Make sure you discuss any questions you have  with your health care provider. Document Released: 04/01/2017 Document Revised: 01/26/2019 Document Reviewed: 04/01/2017 Elsevier Patient Education  2020 [employer].

## 2019-08-11 NOTE — Progress Notes (Signed)
Subjective  CC:  Chief Complaint  Patient presents with  . Skin irritation    Noticed Monday, right arm.. Reports that there is redness, swelling, lumps, and warmness to the touch   Same day acute visit; PCP not available. New pt to me. Chart reviewed.   HPI: Autumn Conley is a 73 y.o. female who presents to the office today to address the problems listed above in the chief complaint.  73 year old female patient currently undergoing chemotherapy for thymoma presents with her daughter due to concerns regarding recent onset red sore spots on right upper extremity.  She has received several IV infusions for chemotherapy over the last several months.  Last infusion was 3 weeks ago.  2 to 3 days ago she noticed some red soreness in her forearm.  Now has some nodules that are tender and a red flat area in the inner forearm that is sore.  She denies fevers or chills.  No recent trauma.  No shortness of breath.  Otherwise feeling her normal self.  She has a follow-up with the oncologist early next week.  She has not yet contacted them. Assessment  1. Thrombophlebitis arm      Plan   Thrombophlebitis: Exam is consistent with inflammation and thrombophlebitis with palpable cord in the forearm.  Educated and reassured.  Due to her immunosuppression will cover with Keflex as well.  Start warm compresses and aspirin therapy daily.  Follow-up with oncology early next week.  Follow-up with me if any new symptoms develop or if arm worsens.Patient understands and agrees with care plan.    Follow up: 3 months for transfer of care visit with me Visit date not found  No orders of the defined types were placed in this encounter.  Meds ordered this encounter  Medications  . cephALEXin (KEFLEX) 500 MG capsule    Sig: Take 1 capsule (500 mg total) by mouth 2 (two) times daily.    Dispense:  14 capsule    Refill:  0      I reviewed the patients updated PMH, FH, and SocHx.    Patient Active Problem List    Diagnosis Date Noted  . Thymoma -- managed by [facility] - extensive documentation in Care Everywhere 03/22/2019  . Mediastinal mass 02/14/2019  . Mass of upper lobe of right lung 01/20/2019  . B12 deficiency 12/19/2018  . Stress incontinence in female 12/19/2018  . Dyslipidemia 12/14/2018  . OSA (obstructive sleep apnea) 12/07/2018  . History of breast cancer 12/07/2018  . Cognitive complaints 11/25/2018  . False positive stress test 01/29/2015  . Migraine without aura and without status migrainosus, not intractable 12/18/2014  . Small vessel disease, cerebrovascular 12/18/2014  . GERD (gastroesophageal reflux disease) 12/02/2014  . Rebound headache 11/29/2014  . HTN (hypertension) 11/29/2014  . HLD (hyperlipidemia) 11/29/2014  . Anxiety 12/27/2013  . Depression 11/03/2012  . Elevated LFTs 11/03/2012  . Fibromyalgia 11/03/2012  . IBS (irritable bowel syndrome) 11/03/2012  . Lymphocytic colitis 11/03/2012  . Paget's disease of nipple ([place]) 11/03/2012  . Vitamin D deficiency 11/03/2012   No outpatient medications have been marked as taking for the 08/11/19 encounter (Office Visit) with Leamon Arnt, MD.    Allergies: Patient has No Known Allergies. Family History: Patient family history includes Lung cancer (age of onset: 52) in her mother; Prostate cancer (age of onset: 21) in her brother. Social History:  Patient  reports that she has never smoked. She has never used smokeless tobacco. She reports that  she does not drink alcohol or use drugs.  Review of Systems: Constitutional: Negative for fever malaise or anorexia Cardiovascular: negative for chest pain Respiratory: negative for SOB or persistent cough Gastrointestinal: negative for abdominal pain  Objective  Vitals: BP 130/64   Pulse 97   Temp 97.6 F (36.4 C) (Tympanic)   Resp 16   Wt 167 lb 9.6 oz (76 kg)   SpO2 96%   BMI 29.69 kg/m  General: no acute distress , A&Ox3 HEENT: PEERL, conjunctiva normal,  Oropharynx moist,neck is supple Cardiovascular:  RRR without murmur or gallop.  Respiratory:  Good breath sounds bilaterally, CTAB with normal respiratory effort Skin:  Warm, right inner upper forearm with macular warm tender area with palpable cord about 3 inches long.  Right wrist with red tender nodule and warmth over superficial vein.  Normal radial pulses no swelling in the upper extremity     Commons side effects, risks, benefits, and alternatives for medications and treatment plan prescribed today were discussed, and the patient expressed understanding of the given instructions. Patient is instructed to call or message via MyChart if he/she has any questions or concerns regarding our treatment plan. No barriers to understanding were identified. We discussed Red Flag symptoms and signs in detail. Patient expressed understanding regarding what to do in case of urgent or emergency type symptoms.   Medication list was reconciled, printed and provided to the patient in AVS. Patient instructions and summary information was reviewed with the patient as documented in the AVS. This note was prepared with assistance of Dragon voice recognition software. Occasional wrong-word or sound-a-like substitutions may have occurred due to the inherent limitations of voice recognition software

## 2019-08-15 DIAGNOSIS — D4989 Neoplasm of unspecified behavior of other specified sites: Secondary | ICD-10-CM | POA: Diagnosis not present

## 2019-08-15 DIAGNOSIS — C9100 Acute lymphoblastic leukemia not having achieved remission: Secondary | ICD-10-CM | POA: Diagnosis not present

## 2019-08-15 DIAGNOSIS — R918 Other nonspecific abnormal finding of lung field: Secondary | ICD-10-CM | POA: Diagnosis not present

## 2019-08-15 DIAGNOSIS — R0602 Shortness of breath: Secondary | ICD-10-CM | POA: Diagnosis not present

## 2019-09-27 ENCOUNTER — Encounter: Payer: Self-pay | Admitting: *Deleted

## 2019-10-03 ENCOUNTER — Other Ambulatory Visit: Payer: Self-pay | Admitting: Family Medicine

## 2019-10-03 DIAGNOSIS — I10 Essential (primary) hypertension: Secondary | ICD-10-CM

## 2019-10-25 DIAGNOSIS — Z20828 Contact with and (suspected) exposure to other viral communicable diseases: Secondary | ICD-10-CM | POA: Diagnosis not present

## 2019-10-31 DIAGNOSIS — R079 Chest pain, unspecified: Secondary | ICD-10-CM | POA: Diagnosis not present

## 2019-10-31 DIAGNOSIS — C37 Malignant neoplasm of thymus: Secondary | ICD-10-CM | POA: Diagnosis not present

## 2019-10-31 DIAGNOSIS — K760 Fatty (change of) liver, not elsewhere classified: Secondary | ICD-10-CM | POA: Diagnosis not present

## 2019-10-31 DIAGNOSIS — R0602 Shortness of breath: Secondary | ICD-10-CM | POA: Diagnosis not present

## 2019-10-31 DIAGNOSIS — D4989 Neoplasm of unspecified behavior of other specified sites: Secondary | ICD-10-CM | POA: Diagnosis not present

## 2019-10-31 DIAGNOSIS — K228 Other specified diseases of esophagus: Secondary | ICD-10-CM | POA: Diagnosis not present

## 2019-10-31 DIAGNOSIS — K449 Diaphragmatic hernia without obstruction or gangrene: Secondary | ICD-10-CM | POA: Diagnosis not present

## 2019-11-07 ENCOUNTER — Other Ambulatory Visit: Payer: Self-pay

## 2019-11-07 ENCOUNTER — Emergency Department (HOSPITAL_COMMUNITY)
Admission: EM | Admit: 2019-11-07 | Discharge: 2019-11-08 | Disposition: A | Discharge status: Home / Self Care | Payer: Medicare Other | Attending: Emergency Medicine | Admitting: Emergency Medicine

## 2019-11-07 DIAGNOSIS — Z853 Personal history of malignant neoplasm of breast: Secondary | ICD-10-CM | POA: Diagnosis not present

## 2019-11-07 DIAGNOSIS — Z7982 Long term (current) use of aspirin: Secondary | ICD-10-CM | POA: Insufficient documentation

## 2019-11-07 DIAGNOSIS — I10 Essential (primary) hypertension: Secondary | ICD-10-CM | POA: Diagnosis not present

## 2019-11-07 DIAGNOSIS — R109 Unspecified abdominal pain: Secondary | ICD-10-CM

## 2019-11-07 DIAGNOSIS — R1031 Right lower quadrant pain: Secondary | ICD-10-CM | POA: Diagnosis not present

## 2019-11-07 DIAGNOSIS — Z85238 Personal history of other malignant neoplasm of thymus: Secondary | ICD-10-CM | POA: Insufficient documentation

## 2019-11-07 DIAGNOSIS — Z79899 Other long term (current) drug therapy: Secondary | ICD-10-CM | POA: Insufficient documentation

## 2019-11-07 LAB — LIPASE, BLOOD: Lipase: 32 U/L (ref 11–51)

## 2019-11-07 LAB — CBC
HCT: 31.9 % — ABNORMAL LOW (ref 36.0–46.0)
Hemoglobin: 10.4 g/dL — ABNORMAL LOW (ref 12.0–15.0)
MCH: 29.8 pg (ref 26.0–34.0)
MCHC: 32.6 g/dL (ref 30.0–36.0)
MCV: 91.4 fL (ref 80.0–100.0)
Platelets: 178 10*3/uL (ref 150–400)
RBC: 3.49 MIL/uL — ABNORMAL LOW (ref 3.87–5.11)
RDW: 14.1 % (ref 11.5–15.5)
WBC: 5.3 10*3/uL (ref 4.0–10.5)
nRBC: 0 % (ref 0.0–0.2)

## 2019-11-07 LAB — COMPREHENSIVE METABOLIC PANEL
ALT: 39 U/L (ref 0–44)
AST: 31 U/L (ref 15–41)
Albumin: 3.6 g/dL (ref 3.5–5.0)
Alkaline Phosphatase: 127 U/L — ABNORMAL HIGH (ref 38–126)
Anion gap: 9 (ref 5–15)
BUN: 15 mg/dL (ref 8–23)
CO2: 27 mmol/L (ref 22–32)
Calcium: 8.8 mg/dL — ABNORMAL LOW (ref 8.9–10.3)
Chloride: 102 mmol/L (ref 98–111)
Creatinine, Ser: 1.03 mg/dL — ABNORMAL HIGH (ref 0.44–1.00)
GFR calc Af Amer: >60 mL/min (ref >60)
GFR calc non Af Amer: 54 mL/min — ABNORMAL LOW (ref >60)
Glucose, Bld: 157 mg/dL — ABNORMAL HIGH (ref 70–99)
Potassium: 4 mmol/L (ref 3.5–5.1)
Sodium: 138 mmol/L (ref 135–145)
Total Bilirubin: 0.5 mg/dL (ref 0.3–1.2)
Total Protein: 6.8 g/dL (ref 6.5–8.1)

## 2019-11-07 MED ORDER — ONDANSETRON HCL 4 MG/2ML IJ SOLN
4.0000 mg | Freq: Once | INTRAMUSCULAR | Status: AC
  Administered 2019-11-08: 4 mg via INTRAVENOUS
  Filled 2019-11-07: qty 2

## 2019-11-07 MED ORDER — SODIUM CHLORIDE 0.9 % IV BOLUS
1000.0000 mL | Freq: Once | INTRAVENOUS | Status: AC
  Administered 2019-11-07: 1000 mL via INTRAVENOUS

## 2019-11-07 MED ORDER — HYDROMORPHONE HCL 1 MG/ML IJ SOLN
1.0000 mg | Freq: Once | INTRAMUSCULAR | Status: AC
  Administered 2019-11-08: 1 mg via INTRAVENOUS
  Filled 2019-11-07: qty 1

## 2019-11-07 NOTE — ED Triage Notes (Addendum)
Pt just completed chemo and restarts in February. Pt c/o RLQ pain for a few days. Pt clutching abd in triage.

## 2019-11-07 NOTE — ED Notes (Signed)
Autumn Conley, daughter  [phone]

## 2019-11-08 ENCOUNTER — Emergency Department (HOSPITAL_COMMUNITY): Payer: Medicare Other

## 2019-11-08 DIAGNOSIS — R1031 Right lower quadrant pain: Secondary | ICD-10-CM | POA: Diagnosis not present

## 2019-11-08 LAB — URINALYSIS, ROUTINE W REFLEX MICROSCOPIC
Bilirubin Urine: NEGATIVE
Glucose, UA: NEGATIVE mg/dL
Hgb urine dipstick: NEGATIVE
Ketones, ur: NEGATIVE mg/dL
Leukocytes,Ua: NEGATIVE
Nitrite: NEGATIVE
Protein, ur: NEGATIVE mg/dL
Specific Gravity, Urine: 1.043 — ABNORMAL HIGH (ref 1.005–1.030)
pH: 6 (ref 5.0–8.0)

## 2019-11-08 MED ORDER — IOHEXOL 300 MG/ML  SOLN
100.0000 mL | Freq: Once | INTRAMUSCULAR | Status: AC | PRN
  Administered 2019-11-08: 100 mL via INTRAVENOUS

## 2019-11-08 MED ORDER — DICYCLOMINE HCL 20 MG PO TABS: 20.0000 mg | ORAL_TABLET | Freq: Two times a day (BID) | ORAL | 0 refills | Status: DC | PRN

## 2019-11-08 MED ORDER — LACTINEX PO CHEW: 1.0000 | CHEWABLE_TABLET | Freq: Three times a day (TID) | ORAL | 0 refills | Status: DC

## 2019-11-08 MED ORDER — AMOXICILLIN-POT CLAVULANATE 875-125 MG PO TABS: 1.0000 | ORAL_TABLET | Freq: Two times a day (BID) | ORAL | 0 refills | Status: DC

## 2019-11-08 MED ORDER — DICYCLOMINE HCL 10 MG PO CAPS
10.0000 mg | ORAL_CAPSULE | Freq: Once | ORAL | Status: AC
  Administered 2019-11-08: 02:00:00 10 mg via ORAL
  Filled 2019-11-08: qty 1

## 2019-11-08 NOTE — ED Provider Notes (Signed)
[facility] EMERGENCY DEPARTMENT Provider Note   CSN: [id number] Arrival date & time: 11/07/19  2225     History Chief Complaint  Patient presents with  . Abdominal Pain    Autumn Conley is a 74 y.o. female.  74 y/o female with hx of left breast CA, fibromyalgia, HLD, HTN, MVP, and metastatic thymoma (last chemotherapy (Alimta) infusion was 10 weeks ago) presents to the emergency department for evaluation of right-sided abdominal pain.  She indicates pain to her right lower abdomen which has been constant over the past 4 to 5 days.  She describes the pain as a soreness, but began to have intermittent severe, sharp pains in her right lower abdomen tonight.  States that sharp pains will last less than a minute before spontaneously improving.  It does not radiate.  She did try and take a Percocet for pain without significant relief.  No associated fevers, nausea, vomiting, dysuria, hematuria, diarrhea, melena, hematochezia.  Abdominal surgical history significant for partial hysterectomy, appendectomy.  The history is provided by the patient. No language interpreter was used.  Abdominal Pain      Past Medical History:  Diagnosis Date  . Anxiety   . Arthritis    "joints" (11/29/2014)  . Autoimmune disorder ([place])    "non-specific"  . Cancer of left breast ([place])   . Depression   . Fibromyalgia    "some; not chronic" (11/29/2014)  . GERD (gastroesophageal reflux disease)   . Glaucoma of both eyes   . Headache    "weekly" (11/29/2014)  . Heart murmur   . Hypercholesteremia   . Hypertension   . IBS (irritable bowel syndrome)   . Mitral valve prolapse   . NASH (nonalcoholic steatohepatitis)   . Osteoarthritis   . Pneumonia   . PONV (postoperative nausea and vomiting)   . Sleep apnea     Patient Active Problem List   Diagnosis Date Noted  . Thymoma -- managed by [facility] - extensive documentation in Care Everywhere 03/22/2019  . Mediastinal mass 02/14/2019  . Mass  of upper lobe of right lung 01/20/2019  . B12 deficiency 12/19/2018  . Stress incontinence in female 12/19/2018  . Dyslipidemia 12/14/2018  . OSA (obstructive sleep apnea) 12/07/2018  . History of breast cancer 12/07/2018  . Cognitive complaints 11/25/2018  . False positive stress test 01/29/2015  . Migraine without aura and without status migrainosus, not intractable 12/18/2014  . Small vessel disease, cerebrovascular 12/18/2014  . GERD (gastroesophageal reflux disease) 12/02/2014  . Rebound headache 11/29/2014  . HTN (hypertension) 11/29/2014  . HLD (hyperlipidemia) 11/29/2014  . Anxiety 12/27/2013  . Depression 11/03/2012  . Elevated LFTs 11/03/2012  . Fibromyalgia 11/03/2012  . IBS (irritable bowel syndrome) 11/03/2012  . Lymphocytic colitis 11/03/2012  . Paget's disease of nipple ([place]) 11/03/2012  . Vitamin D deficiency 11/03/2012    Past Surgical History:  Procedure Laterality Date  . ABDOMINAL HYSTERECTOMY  1980  . APPENDECTOMY  1953  . BREAST BIOPSY Left   . BREAST LUMPECTOMY Left   . CATARACT EXTRACTION, BILATERAL  2018  . MASTECTOMY Left ~ 2009  . PARASTERNAL EXPLORATION Right 02/14/2019   Procedure: PARASTERNAL MEDIAL EXPLORATION WITH BIOPIES.;  Surgeon: [name], MD;  Location: [facility] OR;  Service: Thoracic;  Laterality: Right;     OB History   No obstetric history on file.     Family History  Problem Relation Age of Onset  . Lung cancer Mother 42  . Prostate cancer Brother 53  Social History   Tobacco Use  . Smoking status: Never Smoker  . Smokeless tobacco: Never Used  Substance Use Topics  . Alcohol use: No  . Drug use: No    Home Medications Prior to Admission medications   Medication Sig Start Date End Date Taking? Authorizing Provider  amoxicillin-clavulanate (AUGMENTIN) 875-125 MG tablet Take 1 tablet by mouth every 12 (twelve) hours. 11/08/19   Antonietta Breach, PA-C  Ascorbic Acid (VITAMIN C) 1000 MG tablet Take 1,000 mg by mouth  daily.    [provider]  aspirin EC 81 MG tablet Take 81 mg by mouth daily.    [provider]  atorvastatin (LIPITOR) 40 MG tablet Take 1 tablet (40 mg total) by mouth at bedtime. 04/05/19   Briscoe Deutscher, DO  B-D 3CC LUER-LOK SYR 23GX1" 23G X 1" 3 ML MISC USE AS DIRECTED WITH  CYANOCOBALAMIN (USE 23G  NEEDLE TO DRAW MEDICATION  FROM THE VIAL THEN SWITCH  TO 25G NEEDLE TO INJECT) 05/26/19   Briscoe Deutscher, DO  B-D DISP NEEDLE 25GX1" 25G X 1" MISC USE AS DIRECTED WITH  CYANOCOBALAMIN (USE 23G  NEEDLE TO DRAW MEDICATION  FROM THE VIAL THEN SWITCH  TO 25G NEEDLE TO INJECT) 05/26/19   Briscoe Deutscher, DO  cephALEXin (KEFLEX) 500 MG capsule Take 1 capsule (500 mg total) by mouth 2 (two) times daily. 08/11/19   Leamon Arnt, MD  cycloSPORINE (RESTASIS) 0.05 % ophthalmic emulsion Place 1 drop into both eyes 2 (two) times daily.    [provider]  dicyclomine (BENTYL) 20 MG tablet Take 1 tablet (20 mg total) by mouth every 12 (twelve) hours as needed (for abdominal pain/cramping). 11/08/19   Antonietta Breach, PA-C  gabapentin (NEURONTIN) 300 MG capsule Take 300 mg by mouth at bedtime.  10/14/18   [provider]  irbesartan (AVAPRO) 150 MG tablet TAKE 1 TABLET BY MOUTH  DAILY 10/04/19   Marin Olp, MD  lactobacillus acidophilus & bulgar (LACTINEX) chewable tablet Chew 1 tablet by mouth 3 (three) times daily with meals. 11/08/19   Antonietta Breach, PA-C  oxyCODONE (OXY IR/ROXICODONE) 5 MG immediate release tablet Take 1 tablet (5 mg total) by mouth every 6 (six) hours as needed for severe pain. 02/16/19   Gold, Wayne E, PA-C  PARoxetine (PAXIL) 20 MG tablet TAKE 3 TABLETS BY MOUTH IN  THE MORNING 10/04/19   Marin Olp, MD  senna-docusate (PERI-COLACE) 8.6-50 MG tablet Take 1 tablet by mouth daily as needed for mild constipation.     [provider]  SUMAtriptan (IMITREX) 100 MG tablet Take 1 tablet by mouth 2 (two) times daily as needed. At least 2 hrs between  doses as needed bid 10/14/18   [provider]    Allergies    Patient has no known allergies.  Review of Systems   Review of Systems  Gastrointestinal: Positive for abdominal pain.  Ten systems reviewed and are negative for acute change, except as noted in the HPI.    Physical Exam Updated Vital Signs BP 131/73 (BP Location: Right Arm)   Pulse 100   Temp 99.2 F (37.3 C) (Oral)   Resp 15   SpO2 95%   Physical Exam Vitals and nursing note reviewed.  Constitutional:      General: She is not in acute distress.    Appearance: She is well-developed. She is not diaphoretic.     Comments: Nontoxic appearing and in NAD  HENT:     Head: Normocephalic  and atraumatic.  Eyes:     General: No scleral icterus.    Conjunctiva/sclera: Conjunctivae normal.  Cardiovascular:     Rate and Rhythm: Normal rate and regular rhythm.     Pulses: Normal pulses.     Comments: Patient no longer tachycardic as noted in triage Pulmonary:     Effort: Pulmonary effort is normal. No respiratory distress.     Comments: Respirations even and unlabored Abdominal:     Palpations: Abdomen is soft.     Comments: Soft, obese, nondistended abdomen.  There is focal tenderness in the right upper quadrant with negative Murphy sign.  No peritoneal signs or palpable masses appreciated.  Musculoskeletal:        General: Normal range of motion.     Cervical back: Normal range of motion.  Skin:    General: Skin is warm and dry.     Coloration: Skin is not pale.     Findings: No erythema or rash.  Neurological:     Mental Status: She is alert and oriented to person, place, and time.     Coordination: Coordination normal.  Psychiatric:        Behavior: Behavior normal.     ED Results / Procedures / Treatments   Labs (all labs ordered are listed, but only abnormal results are displayed) Labs Reviewed  COMPREHENSIVE METABOLIC PANEL - Abnormal; Notable for the following components:      Result Value    Glucose, Bld 157 (*)    Creatinine, Ser 1.03 (*)    Calcium 8.8 (*)    Alkaline Phosphatase 127 (*)    GFR calc non Af Amer 54 (*)    All other components within normal limits  CBC - Abnormal; Notable for the following components:   RBC 3.49 (*)    Hemoglobin 10.4 (*)    HCT 31.9 (*)    All other components within normal limits  URINALYSIS, ROUTINE W REFLEX MICROSCOPIC - Abnormal; Notable for the following components:   Specific Gravity, Urine 1.043 (*)    All other components within normal limits  LIPASE, BLOOD    EKG None  Radiology CT ABDOMEN PELVIS W CONTRAST  Result Date: 11/08/2019 CLINICAL DATA:  Abdominal pain. Right lower quadrant pain. History of appendectomy and hysterectomy. EXAM: CT ABDOMEN AND PELVIS WITH CONTRAST TECHNIQUE: Multidetector CT imaging of the abdomen and pelvis was performed using the standard protocol following bolus administration of intravenous contrast. CONTRAST:  11m OMNIPAQUE IOHEXOL 300 MG/ML  SOLN COMPARISON:  None. FINDINGS: Lower chest: There is a residual 3.7 x 1.3 cm partially visualized mass that appears to be centered within the mediastinum. There is scarring versus atelectasis at the lung bases. Hepatobiliary: There is decreased hepatic attenuation suggestive of hepatic steatosis. Normal gallbladder.There is no biliary ductal dilation. Pancreas: Normal contours without ductal dilatation. No peripancreatic fluid collection. Spleen: No splenic laceration or hematoma. Adrenals/Urinary Tract: --Adrenal glands: No adrenal hemorrhage. --Right kidney/ureter: No hydronephrosis or perinephric hematoma. There is a small exophytic cyst arising from the lower pole. --Left kidney/ureter: No hydronephrosis or perinephric hematoma. There is a small exophytic cyst arising from the lower pole. --Urinary bladder: Unremarkable. Stomach/Bowel: --Stomach/Duodenum: There is a small hiatal hernia. There is a small posterior gastric diverticulum. --Small bowel: No  dilatation or inflammation. --Colon: The cecum is located in the right upper quadrant. There are inflammatory changes involving the cecum with a 4.1 x 2.9 cm mass like area abutting the anterior abdominal wall. There are few small prominent regional  lymph nodes in this location. --Appendix: The appendix is not reliably identified and is likely surgically absent per the patient's history. Vascular/Lymphatic: Atherosclerotic calcification is present within the non-aneurysmal abdominal aorta, without hemodynamically significant stenosis. --No retroperitoneal lymphadenopathy. --No mesenteric lymphadenopathy. --No pelvic or inguinal lymphadenopathy. Reproductive: Status post hysterectomy. There is a simple appearing 1.8 cm cyst in the left ovary. Other: No ascites or free air. The abdominal wall is normal. Musculoskeletal. No acute displaced fractures. IMPRESSION: 1. Mild inflammatory changes about the cecum may be secondary to a focal infectious or inflammatory colitis. In this region, there is a masslike appearance of the right lateral wall of the cecum with a few mildly enlarged regional lymph nodes. As an underlying mass cannot be excluded, follow-up with a nonemergent outpatient colonoscopy is recommended. 2. Persistent, partially visualized mediastinal mass as detailed above. This is likely related to the patient's known thymoma. Outpatient follow-up is recommended. 3. Additional chronic findings as above. Aortic Atherosclerosis (ICD10-I70.0). Electronically Signed   By: Constance Holster M.D.   On: 11/08/2019 00:42    Procedures Procedures (including critical care time)  Medications Ordered in ED Medications  sodium chloride 0.9 % bolus 1,000 mL (0 mLs Intravenous Stopped 11/08/19 0200)  HYDROmorphone (DILAUDID) injection 1 mg (1 mg Intravenous Given 11/08/19 0038)  ondansetron (ZOFRAN) injection 4 mg (4 mg Intravenous Given 11/08/19 0038)  iohexol (OMNIPAQUE) 300 MG/ML solution 100 mL (100 mLs  Intravenous Contrast Given 11/08/19 0023)  dicyclomine (BENTYL) capsule 10 mg (10 mg Oral Given 11/08/19 0149)    ED Course  I have reviewed the triage vital signs and the nursing notes.  Pertinent labs & imaging results that were available during my care of the patient were reviewed by me and considered in my medical decision making (see chart for details).    MDM Rules/Calculators/A&P                      73 year old female presents to the emergency department for evaluation of right lower quadrant abdominal pain which has been constant x4 to 5 days.  Describes a soreness in her abdomen.  Developed intermittent sharp fleeting pain sensations for the last 24 hours.  No bowel changes, nausea, vomiting, fevers.  Her laboratory evaluation today is generally reassuring.  Given her history of metastatic thymoma, patient underwent abdominal CT.  This shows inflammatory versus infectious changes to the cecum.  There is also concern for an underlying mass in the right lateral wall of the cecum with regional lymphadenopathy.  No evidence of obstruction or other acute pathology.  Plan for discharge on Augmentin for coverage of suspected colitis.  She will also be referred to gastroenterology as she would benefit from an outpatient colonoscopy.  Instructed also to see her oncologist.  She has Percocet at home which she can use for pain control.  We will add Bentyl, Lactinex.  Return precautions discussed and provided. Patient discharged in stable condition with no unaddressed concerns.   Final Clinical Impression(s) / ED Diagnoses Final diagnoses:  Abdominal pain, unspecified abdominal location    Rx / DC Orders ED Discharge Orders         Ordered    amoxicillin-clavulanate (AUGMENTIN) 875-125 MG tablet  Every 12 hours     11/08/19 0136    lactobacillus acidophilus & bulgar (LACTINEX) chewable tablet  3 times daily with meals     11/08/19 0136    dicyclomine (BENTYL) 20 MG tablet  Every 12 hours  PRN  11/08/19 0136           Antonietta Breach, PA-C 11/08/19 [place], [place], DO 11/08/19 [phone]

## 2019-11-08 NOTE — Discharge Instructions (Addendum)
Your found to have some inflammation around your cecum.  This may be from infection.  Take Augmentin as prescribed.  Antibiotics may cause diarrhea.  For this reason you have been prescribed Lactinex.  You may continue your previously prescribed pain medicine, though Bentyl may also help relieve episodes of worsening pain.  Drink plenty of fluids to prevent dehydration.    Your CT was also concerning for mass-like structure of your cecum. It is unclear if this is cancer or related to your metastatic thymoma. We recommend follow-up with your primary care doctor as well as your oncologist.  You would also benefit from colonoscopy for further characterization of possible colonic mass; follow up with Gastroenterology.  Return to the ED for any new or concerning symptoms.

## 2019-11-08 NOTE — ED Notes (Signed)
Patient transported to CT 

## 2019-11-11 DIAGNOSIS — D4989 Neoplasm of unspecified behavior of other specified sites: Secondary | ICD-10-CM | POA: Diagnosis not present

## 2019-11-11 DIAGNOSIS — K529 Noninfective gastroenteritis and colitis, unspecified: Secondary | ICD-10-CM | POA: Diagnosis not present

## 2019-11-11 DIAGNOSIS — R933 Abnormal findings on diagnostic imaging of other parts of digestive tract: Secondary | ICD-10-CM | POA: Diagnosis not present

## 2019-11-15 DIAGNOSIS — C782 Secondary malignant neoplasm of pleura: Secondary | ICD-10-CM | POA: Diagnosis not present

## 2019-11-15 DIAGNOSIS — C37 Malignant neoplasm of thymus: Secondary | ICD-10-CM | POA: Diagnosis not present

## 2019-11-15 DIAGNOSIS — Z510 Encounter for antineoplastic radiation therapy: Secondary | ICD-10-CM | POA: Diagnosis not present

## 2019-11-15 DIAGNOSIS — R7309 Other abnormal glucose: Secondary | ICD-10-CM | POA: Diagnosis not present

## 2019-11-15 DIAGNOSIS — C73 Malignant neoplasm of thyroid gland: Secondary | ICD-10-CM | POA: Diagnosis not present

## 2019-11-15 DIAGNOSIS — D4989 Neoplasm of unspecified behavior of other specified sites: Secondary | ICD-10-CM | POA: Diagnosis not present

## 2019-11-21 DIAGNOSIS — C37 Malignant neoplasm of thymus: Secondary | ICD-10-CM | POA: Diagnosis not present

## 2019-11-21 DIAGNOSIS — R079 Chest pain, unspecified: Secondary | ICD-10-CM | POA: Diagnosis not present

## 2019-11-21 DIAGNOSIS — C50919 Malignant neoplasm of unspecified site of unspecified female breast: Secondary | ICD-10-CM | POA: Diagnosis not present

## 2019-11-21 DIAGNOSIS — D4989 Neoplasm of unspecified behavior of other specified sites: Secondary | ICD-10-CM | POA: Diagnosis not present

## 2019-11-25 DIAGNOSIS — Z20828 Contact with and (suspected) exposure to other viral communicable diseases: Secondary | ICD-10-CM | POA: Diagnosis not present

## 2019-11-25 DIAGNOSIS — K529 Noninfective gastroenteritis and colitis, unspecified: Secondary | ICD-10-CM | POA: Diagnosis not present

## 2019-11-25 DIAGNOSIS — Z20822 Contact with and (suspected) exposure to covid-19: Secondary | ICD-10-CM | POA: Diagnosis not present

## 2019-11-28 ENCOUNTER — Other Ambulatory Visit: Payer: Self-pay | Admitting: Family Medicine

## 2019-11-28 NOTE — Telephone Encounter (Signed)
Please review

## 2019-11-29 ENCOUNTER — Encounter: Payer: PRIVATE HEALTH INSURANCE | Admitting: Family Medicine

## 2019-11-29 DIAGNOSIS — G4733 Obstructive sleep apnea (adult) (pediatric): Secondary | ICD-10-CM | POA: Diagnosis not present

## 2019-11-29 DIAGNOSIS — K219 Gastro-esophageal reflux disease without esophagitis: Secondary | ICD-10-CM | POA: Diagnosis not present

## 2019-11-29 DIAGNOSIS — R599 Enlarged lymph nodes, unspecified: Secondary | ICD-10-CM | POA: Diagnosis not present

## 2019-11-29 DIAGNOSIS — D4989 Neoplasm of unspecified behavior of other specified sites: Secondary | ICD-10-CM | POA: Diagnosis not present

## 2019-11-29 DIAGNOSIS — I10 Essential (primary) hypertension: Secondary | ICD-10-CM | POA: Diagnosis not present

## 2019-11-29 DIAGNOSIS — Z853 Personal history of malignant neoplasm of breast: Secondary | ICD-10-CM | POA: Diagnosis not present

## 2019-11-29 DIAGNOSIS — F419 Anxiety disorder, unspecified: Secondary | ICD-10-CM | POA: Diagnosis not present

## 2019-11-29 DIAGNOSIS — G43909 Migraine, unspecified, not intractable, without status migrainosus: Secondary | ICD-10-CM | POA: Diagnosis not present

## 2019-11-29 DIAGNOSIS — R590 Localized enlarged lymph nodes: Secondary | ICD-10-CM | POA: Diagnosis not present

## 2019-11-29 DIAGNOSIS — Z79899 Other long term (current) drug therapy: Secondary | ICD-10-CM | POA: Diagnosis not present

## 2019-11-29 DIAGNOSIS — Z9010 Acquired absence of unspecified breast and nipple: Secondary | ICD-10-CM | POA: Diagnosis not present

## 2019-11-29 DIAGNOSIS — Z9221 Personal history of antineoplastic chemotherapy: Secondary | ICD-10-CM | POA: Diagnosis not present

## 2019-11-29 DIAGNOSIS — R591 Generalized enlarged lymph nodes: Secondary | ICD-10-CM | POA: Diagnosis not present

## 2019-12-05 DIAGNOSIS — C782 Secondary malignant neoplasm of pleura: Secondary | ICD-10-CM | POA: Diagnosis not present

## 2019-12-05 DIAGNOSIS — Z510 Encounter for antineoplastic radiation therapy: Secondary | ICD-10-CM | POA: Diagnosis not present

## 2019-12-05 DIAGNOSIS — D4989 Neoplasm of unspecified behavior of other specified sites: Secondary | ICD-10-CM | POA: Diagnosis not present

## 2019-12-05 DIAGNOSIS — K760 Fatty (change of) liver, not elsewhere classified: Secondary | ICD-10-CM | POA: Diagnosis not present

## 2019-12-05 DIAGNOSIS — C37 Malignant neoplasm of thymus: Secondary | ICD-10-CM | POA: Diagnosis not present

## 2019-12-12 DIAGNOSIS — Z510 Encounter for antineoplastic radiation therapy: Secondary | ICD-10-CM | POA: Diagnosis not present

## 2019-12-12 DIAGNOSIS — C782 Secondary malignant neoplasm of pleura: Secondary | ICD-10-CM | POA: Diagnosis not present

## 2019-12-12 DIAGNOSIS — D4989 Neoplasm of unspecified behavior of other specified sites: Secondary | ICD-10-CM | POA: Diagnosis not present

## 2019-12-12 DIAGNOSIS — C37 Malignant neoplasm of thymus: Secondary | ICD-10-CM | POA: Diagnosis not present

## 2019-12-13 DIAGNOSIS — Z510 Encounter for antineoplastic radiation therapy: Secondary | ICD-10-CM | POA: Diagnosis not present

## 2019-12-13 DIAGNOSIS — C37 Malignant neoplasm of thymus: Secondary | ICD-10-CM | POA: Diagnosis not present

## 2019-12-13 DIAGNOSIS — C782 Secondary malignant neoplasm of pleura: Secondary | ICD-10-CM | POA: Diagnosis not present

## 2019-12-13 DIAGNOSIS — D4989 Neoplasm of unspecified behavior of other specified sites: Secondary | ICD-10-CM | POA: Diagnosis not present

## 2019-12-14 DIAGNOSIS — C37 Malignant neoplasm of thymus: Secondary | ICD-10-CM | POA: Diagnosis not present

## 2019-12-14 DIAGNOSIS — Z510 Encounter for antineoplastic radiation therapy: Secondary | ICD-10-CM | POA: Diagnosis not present

## 2019-12-14 DIAGNOSIS — D4989 Neoplasm of unspecified behavior of other specified sites: Secondary | ICD-10-CM | POA: Diagnosis not present

## 2019-12-14 DIAGNOSIS — C782 Secondary malignant neoplasm of pleura: Secondary | ICD-10-CM | POA: Diagnosis not present

## 2019-12-15 DIAGNOSIS — D4989 Neoplasm of unspecified behavior of other specified sites: Secondary | ICD-10-CM | POA: Diagnosis not present

## 2019-12-15 DIAGNOSIS — C782 Secondary malignant neoplasm of pleura: Secondary | ICD-10-CM | POA: Diagnosis not present

## 2019-12-15 DIAGNOSIS — Z510 Encounter for antineoplastic radiation therapy: Secondary | ICD-10-CM | POA: Diagnosis not present

## 2019-12-15 DIAGNOSIS — C37 Malignant neoplasm of thymus: Secondary | ICD-10-CM | POA: Diagnosis not present

## 2019-12-16 DIAGNOSIS — C782 Secondary malignant neoplasm of pleura: Secondary | ICD-10-CM | POA: Diagnosis not present

## 2019-12-16 DIAGNOSIS — D4989 Neoplasm of unspecified behavior of other specified sites: Secondary | ICD-10-CM | POA: Diagnosis not present

## 2019-12-16 DIAGNOSIS — Z510 Encounter for antineoplastic radiation therapy: Secondary | ICD-10-CM | POA: Diagnosis not present

## 2019-12-16 DIAGNOSIS — C37 Malignant neoplasm of thymus: Secondary | ICD-10-CM | POA: Diagnosis not present

## 2019-12-19 DIAGNOSIS — C782 Secondary malignant neoplasm of pleura: Secondary | ICD-10-CM | POA: Diagnosis not present

## 2019-12-19 DIAGNOSIS — C37 Malignant neoplasm of thymus: Secondary | ICD-10-CM | POA: Diagnosis not present

## 2019-12-19 DIAGNOSIS — M62838 Other muscle spasm: Secondary | ICD-10-CM | POA: Diagnosis not present

## 2019-12-19 DIAGNOSIS — D4989 Neoplasm of unspecified behavior of other specified sites: Secondary | ICD-10-CM | POA: Diagnosis not present

## 2019-12-19 DIAGNOSIS — Z510 Encounter for antineoplastic radiation therapy: Secondary | ICD-10-CM | POA: Diagnosis not present

## 2019-12-20 DIAGNOSIS — C782 Secondary malignant neoplasm of pleura: Secondary | ICD-10-CM | POA: Diagnosis not present

## 2019-12-20 DIAGNOSIS — C37 Malignant neoplasm of thymus: Secondary | ICD-10-CM | POA: Diagnosis not present

## 2019-12-20 DIAGNOSIS — M62838 Other muscle spasm: Secondary | ICD-10-CM | POA: Diagnosis not present

## 2019-12-20 DIAGNOSIS — Z510 Encounter for antineoplastic radiation therapy: Secondary | ICD-10-CM | POA: Diagnosis not present

## 2019-12-20 DIAGNOSIS — D4989 Neoplasm of unspecified behavior of other specified sites: Secondary | ICD-10-CM | POA: Diagnosis not present

## 2019-12-21 DIAGNOSIS — C782 Secondary malignant neoplasm of pleura: Secondary | ICD-10-CM | POA: Diagnosis not present

## 2019-12-21 DIAGNOSIS — D4989 Neoplasm of unspecified behavior of other specified sites: Secondary | ICD-10-CM | POA: Diagnosis not present

## 2019-12-21 DIAGNOSIS — M62838 Other muscle spasm: Secondary | ICD-10-CM | POA: Diagnosis not present

## 2019-12-21 DIAGNOSIS — Z510 Encounter for antineoplastic radiation therapy: Secondary | ICD-10-CM | POA: Diagnosis not present

## 2019-12-21 DIAGNOSIS — C37 Malignant neoplasm of thymus: Secondary | ICD-10-CM | POA: Diagnosis not present

## 2019-12-22 DIAGNOSIS — D4989 Neoplasm of unspecified behavior of other specified sites: Secondary | ICD-10-CM | POA: Diagnosis not present

## 2019-12-22 DIAGNOSIS — M62838 Other muscle spasm: Secondary | ICD-10-CM | POA: Diagnosis not present

## 2019-12-22 DIAGNOSIS — Z510 Encounter for antineoplastic radiation therapy: Secondary | ICD-10-CM | POA: Diagnosis not present

## 2019-12-22 DIAGNOSIS — C782 Secondary malignant neoplasm of pleura: Secondary | ICD-10-CM | POA: Diagnosis not present

## 2019-12-22 DIAGNOSIS — C37 Malignant neoplasm of thymus: Secondary | ICD-10-CM | POA: Diagnosis not present

## 2019-12-23 DIAGNOSIS — D4989 Neoplasm of unspecified behavior of other specified sites: Secondary | ICD-10-CM | POA: Diagnosis not present

## 2019-12-23 DIAGNOSIS — C37 Malignant neoplasm of thymus: Secondary | ICD-10-CM | POA: Diagnosis not present

## 2019-12-23 DIAGNOSIS — C782 Secondary malignant neoplasm of pleura: Secondary | ICD-10-CM | POA: Diagnosis not present

## 2019-12-23 DIAGNOSIS — Z510 Encounter for antineoplastic radiation therapy: Secondary | ICD-10-CM | POA: Diagnosis not present

## 2019-12-23 DIAGNOSIS — M62838 Other muscle spasm: Secondary | ICD-10-CM | POA: Diagnosis not present

## 2019-12-25 ENCOUNTER — Other Ambulatory Visit: Payer: Self-pay | Admitting: Family Medicine

## 2019-12-25 DIAGNOSIS — I10 Essential (primary) hypertension: Secondary | ICD-10-CM

## 2019-12-26 DIAGNOSIS — D4989 Neoplasm of unspecified behavior of other specified sites: Secondary | ICD-10-CM | POA: Diagnosis not present

## 2019-12-26 DIAGNOSIS — C782 Secondary malignant neoplasm of pleura: Secondary | ICD-10-CM | POA: Diagnosis not present

## 2019-12-26 DIAGNOSIS — M62838 Other muscle spasm: Secondary | ICD-10-CM | POA: Diagnosis not present

## 2019-12-26 DIAGNOSIS — C37 Malignant neoplasm of thymus: Secondary | ICD-10-CM | POA: Diagnosis not present

## 2019-12-26 DIAGNOSIS — Z510 Encounter for antineoplastic radiation therapy: Secondary | ICD-10-CM | POA: Diagnosis not present

## 2019-12-27 DIAGNOSIS — C782 Secondary malignant neoplasm of pleura: Secondary | ICD-10-CM | POA: Diagnosis not present

## 2019-12-27 DIAGNOSIS — C37 Malignant neoplasm of thymus: Secondary | ICD-10-CM | POA: Diagnosis not present

## 2019-12-27 DIAGNOSIS — Z510 Encounter for antineoplastic radiation therapy: Secondary | ICD-10-CM | POA: Diagnosis not present

## 2019-12-27 DIAGNOSIS — M62838 Other muscle spasm: Secondary | ICD-10-CM | POA: Diagnosis not present

## 2019-12-27 DIAGNOSIS — D4989 Neoplasm of unspecified behavior of other specified sites: Secondary | ICD-10-CM | POA: Diagnosis not present

## 2019-12-28 DIAGNOSIS — C37 Malignant neoplasm of thymus: Secondary | ICD-10-CM | POA: Diagnosis not present

## 2019-12-28 DIAGNOSIS — D4989 Neoplasm of unspecified behavior of other specified sites: Secondary | ICD-10-CM | POA: Diagnosis not present

## 2019-12-28 DIAGNOSIS — M62838 Other muscle spasm: Secondary | ICD-10-CM | POA: Diagnosis not present

## 2019-12-28 DIAGNOSIS — Z510 Encounter for antineoplastic radiation therapy: Secondary | ICD-10-CM | POA: Diagnosis not present

## 2019-12-28 DIAGNOSIS — C782 Secondary malignant neoplasm of pleura: Secondary | ICD-10-CM | POA: Diagnosis not present

## 2019-12-29 DIAGNOSIS — C37 Malignant neoplasm of thymus: Secondary | ICD-10-CM | POA: Diagnosis not present

## 2019-12-29 DIAGNOSIS — D4989 Neoplasm of unspecified behavior of other specified sites: Secondary | ICD-10-CM | POA: Diagnosis not present

## 2019-12-29 DIAGNOSIS — M62838 Other muscle spasm: Secondary | ICD-10-CM | POA: Diagnosis not present

## 2019-12-29 DIAGNOSIS — C782 Secondary malignant neoplasm of pleura: Secondary | ICD-10-CM | POA: Diagnosis not present

## 2019-12-29 DIAGNOSIS — Z510 Encounter for antineoplastic radiation therapy: Secondary | ICD-10-CM | POA: Diagnosis not present

## 2019-12-30 DIAGNOSIS — C782 Secondary malignant neoplasm of pleura: Secondary | ICD-10-CM | POA: Diagnosis not present

## 2019-12-30 DIAGNOSIS — C37 Malignant neoplasm of thymus: Secondary | ICD-10-CM | POA: Diagnosis not present

## 2019-12-30 DIAGNOSIS — M62838 Other muscle spasm: Secondary | ICD-10-CM | POA: Diagnosis not present

## 2019-12-30 DIAGNOSIS — D4989 Neoplasm of unspecified behavior of other specified sites: Secondary | ICD-10-CM | POA: Diagnosis not present

## 2019-12-30 DIAGNOSIS — Z510 Encounter for antineoplastic radiation therapy: Secondary | ICD-10-CM | POA: Diagnosis not present

## 2020-01-02 DIAGNOSIS — C37 Malignant neoplasm of thymus: Secondary | ICD-10-CM | POA: Diagnosis not present

## 2020-01-02 DIAGNOSIS — M62838 Other muscle spasm: Secondary | ICD-10-CM | POA: Diagnosis not present

## 2020-01-02 DIAGNOSIS — Z510 Encounter for antineoplastic radiation therapy: Secondary | ICD-10-CM | POA: Diagnosis not present

## 2020-01-02 DIAGNOSIS — D4989 Neoplasm of unspecified behavior of other specified sites: Secondary | ICD-10-CM | POA: Diagnosis not present

## 2020-01-02 DIAGNOSIS — C782 Secondary malignant neoplasm of pleura: Secondary | ICD-10-CM | POA: Diagnosis not present

## 2020-01-03 DIAGNOSIS — Z510 Encounter for antineoplastic radiation therapy: Secondary | ICD-10-CM | POA: Diagnosis not present

## 2020-01-03 DIAGNOSIS — C782 Secondary malignant neoplasm of pleura: Secondary | ICD-10-CM | POA: Diagnosis not present

## 2020-01-03 DIAGNOSIS — D4989 Neoplasm of unspecified behavior of other specified sites: Secondary | ICD-10-CM | POA: Diagnosis not present

## 2020-01-03 DIAGNOSIS — C37 Malignant neoplasm of thymus: Secondary | ICD-10-CM | POA: Diagnosis not present

## 2020-01-03 DIAGNOSIS — M62838 Other muscle spasm: Secondary | ICD-10-CM | POA: Diagnosis not present

## 2020-01-04 DIAGNOSIS — Z510 Encounter for antineoplastic radiation therapy: Secondary | ICD-10-CM | POA: Diagnosis not present

## 2020-01-04 DIAGNOSIS — M62838 Other muscle spasm: Secondary | ICD-10-CM | POA: Diagnosis not present

## 2020-01-04 DIAGNOSIS — D4989 Neoplasm of unspecified behavior of other specified sites: Secondary | ICD-10-CM | POA: Diagnosis not present

## 2020-01-04 DIAGNOSIS — C782 Secondary malignant neoplasm of pleura: Secondary | ICD-10-CM | POA: Diagnosis not present

## 2020-01-04 DIAGNOSIS — C37 Malignant neoplasm of thymus: Secondary | ICD-10-CM | POA: Diagnosis not present

## 2020-01-05 DIAGNOSIS — M62838 Other muscle spasm: Secondary | ICD-10-CM | POA: Diagnosis not present

## 2020-01-05 DIAGNOSIS — C782 Secondary malignant neoplasm of pleura: Secondary | ICD-10-CM | POA: Diagnosis not present

## 2020-01-05 DIAGNOSIS — C37 Malignant neoplasm of thymus: Secondary | ICD-10-CM | POA: Diagnosis not present

## 2020-01-05 DIAGNOSIS — D4989 Neoplasm of unspecified behavior of other specified sites: Secondary | ICD-10-CM | POA: Diagnosis not present

## 2020-01-05 DIAGNOSIS — Z510 Encounter for antineoplastic radiation therapy: Secondary | ICD-10-CM | POA: Diagnosis not present

## 2020-01-06 DIAGNOSIS — D4989 Neoplasm of unspecified behavior of other specified sites: Secondary | ICD-10-CM | POA: Diagnosis not present

## 2020-01-06 DIAGNOSIS — C37 Malignant neoplasm of thymus: Secondary | ICD-10-CM | POA: Diagnosis not present

## 2020-01-06 DIAGNOSIS — Z510 Encounter for antineoplastic radiation therapy: Secondary | ICD-10-CM | POA: Diagnosis not present

## 2020-01-06 DIAGNOSIS — M62838 Other muscle spasm: Secondary | ICD-10-CM | POA: Diagnosis not present

## 2020-01-06 DIAGNOSIS — C782 Secondary malignant neoplasm of pleura: Secondary | ICD-10-CM | POA: Diagnosis not present

## 2020-01-09 DIAGNOSIS — M62838 Other muscle spasm: Secondary | ICD-10-CM | POA: Diagnosis not present

## 2020-01-09 DIAGNOSIS — D4989 Neoplasm of unspecified behavior of other specified sites: Secondary | ICD-10-CM | POA: Diagnosis not present

## 2020-01-09 DIAGNOSIS — C37 Malignant neoplasm of thymus: Secondary | ICD-10-CM | POA: Diagnosis not present

## 2020-01-09 DIAGNOSIS — Z510 Encounter for antineoplastic radiation therapy: Secondary | ICD-10-CM | POA: Diagnosis not present

## 2020-01-09 DIAGNOSIS — C782 Secondary malignant neoplasm of pleura: Secondary | ICD-10-CM | POA: Diagnosis not present

## 2020-01-10 DIAGNOSIS — D4989 Neoplasm of unspecified behavior of other specified sites: Secondary | ICD-10-CM | POA: Diagnosis not present

## 2020-01-10 DIAGNOSIS — M62838 Other muscle spasm: Secondary | ICD-10-CM | POA: Diagnosis not present

## 2020-01-10 DIAGNOSIS — C37 Malignant neoplasm of thymus: Secondary | ICD-10-CM | POA: Diagnosis not present

## 2020-01-10 DIAGNOSIS — C782 Secondary malignant neoplasm of pleura: Secondary | ICD-10-CM | POA: Diagnosis not present

## 2020-01-10 DIAGNOSIS — Z510 Encounter for antineoplastic radiation therapy: Secondary | ICD-10-CM | POA: Diagnosis not present

## 2020-01-11 DIAGNOSIS — Z510 Encounter for antineoplastic radiation therapy: Secondary | ICD-10-CM | POA: Diagnosis not present

## 2020-01-11 DIAGNOSIS — C782 Secondary malignant neoplasm of pleura: Secondary | ICD-10-CM | POA: Diagnosis not present

## 2020-01-11 DIAGNOSIS — D4989 Neoplasm of unspecified behavior of other specified sites: Secondary | ICD-10-CM | POA: Diagnosis not present

## 2020-01-11 DIAGNOSIS — M62838 Other muscle spasm: Secondary | ICD-10-CM | POA: Diagnosis not present

## 2020-01-11 DIAGNOSIS — C37 Malignant neoplasm of thymus: Secondary | ICD-10-CM | POA: Diagnosis not present

## 2020-01-12 DIAGNOSIS — M62838 Other muscle spasm: Secondary | ICD-10-CM | POA: Diagnosis not present

## 2020-01-12 DIAGNOSIS — C782 Secondary malignant neoplasm of pleura: Secondary | ICD-10-CM | POA: Diagnosis not present

## 2020-01-12 DIAGNOSIS — Z510 Encounter for antineoplastic radiation therapy: Secondary | ICD-10-CM | POA: Diagnosis not present

## 2020-01-12 DIAGNOSIS — C37 Malignant neoplasm of thymus: Secondary | ICD-10-CM | POA: Diagnosis not present

## 2020-01-12 DIAGNOSIS — D4989 Neoplasm of unspecified behavior of other specified sites: Secondary | ICD-10-CM | POA: Diagnosis not present

## 2020-01-13 DIAGNOSIS — C37 Malignant neoplasm of thymus: Secondary | ICD-10-CM | POA: Diagnosis not present

## 2020-01-13 DIAGNOSIS — M62838 Other muscle spasm: Secondary | ICD-10-CM | POA: Diagnosis not present

## 2020-01-13 DIAGNOSIS — Z510 Encounter for antineoplastic radiation therapy: Secondary | ICD-10-CM | POA: Diagnosis not present

## 2020-01-13 DIAGNOSIS — D4989 Neoplasm of unspecified behavior of other specified sites: Secondary | ICD-10-CM | POA: Diagnosis not present

## 2020-01-13 DIAGNOSIS — C782 Secondary malignant neoplasm of pleura: Secondary | ICD-10-CM | POA: Diagnosis not present

## 2020-01-16 DIAGNOSIS — C782 Secondary malignant neoplasm of pleura: Secondary | ICD-10-CM | POA: Diagnosis not present

## 2020-01-16 DIAGNOSIS — C37 Malignant neoplasm of thymus: Secondary | ICD-10-CM | POA: Diagnosis not present

## 2020-01-16 DIAGNOSIS — M62838 Other muscle spasm: Secondary | ICD-10-CM | POA: Diagnosis not present

## 2020-01-16 DIAGNOSIS — D4989 Neoplasm of unspecified behavior of other specified sites: Secondary | ICD-10-CM | POA: Diagnosis not present

## 2020-01-16 DIAGNOSIS — Z510 Encounter for antineoplastic radiation therapy: Secondary | ICD-10-CM | POA: Diagnosis not present

## 2020-01-17 DIAGNOSIS — Z510 Encounter for antineoplastic radiation therapy: Secondary | ICD-10-CM | POA: Diagnosis not present

## 2020-01-17 DIAGNOSIS — D4989 Neoplasm of unspecified behavior of other specified sites: Secondary | ICD-10-CM | POA: Diagnosis not present

## 2020-01-17 DIAGNOSIS — C782 Secondary malignant neoplasm of pleura: Secondary | ICD-10-CM | POA: Diagnosis not present

## 2020-01-17 DIAGNOSIS — C37 Malignant neoplasm of thymus: Secondary | ICD-10-CM | POA: Diagnosis not present

## 2020-01-17 DIAGNOSIS — M62838 Other muscle spasm: Secondary | ICD-10-CM | POA: Diagnosis not present

## 2020-01-18 DIAGNOSIS — M62838 Other muscle spasm: Secondary | ICD-10-CM | POA: Diagnosis not present

## 2020-01-18 DIAGNOSIS — C782 Secondary malignant neoplasm of pleura: Secondary | ICD-10-CM | POA: Diagnosis not present

## 2020-01-18 DIAGNOSIS — C37 Malignant neoplasm of thymus: Secondary | ICD-10-CM | POA: Diagnosis not present

## 2020-01-18 DIAGNOSIS — D4989 Neoplasm of unspecified behavior of other specified sites: Secondary | ICD-10-CM | POA: Diagnosis not present

## 2020-01-18 DIAGNOSIS — Z510 Encounter for antineoplastic radiation therapy: Secondary | ICD-10-CM | POA: Diagnosis not present

## 2020-01-19 DIAGNOSIS — C37 Malignant neoplasm of thymus: Secondary | ICD-10-CM | POA: Diagnosis not present

## 2020-01-19 DIAGNOSIS — Z510 Encounter for antineoplastic radiation therapy: Secondary | ICD-10-CM | POA: Diagnosis not present

## 2020-01-19 DIAGNOSIS — D4989 Neoplasm of unspecified behavior of other specified sites: Secondary | ICD-10-CM | POA: Diagnosis not present

## 2020-01-19 DIAGNOSIS — C73 Malignant neoplasm of thyroid gland: Secondary | ICD-10-CM | POA: Diagnosis not present

## 2020-01-20 DIAGNOSIS — Z510 Encounter for antineoplastic radiation therapy: Secondary | ICD-10-CM | POA: Diagnosis not present

## 2020-01-20 DIAGNOSIS — C73 Malignant neoplasm of thyroid gland: Secondary | ICD-10-CM | POA: Diagnosis not present

## 2020-01-20 DIAGNOSIS — C37 Malignant neoplasm of thymus: Secondary | ICD-10-CM | POA: Diagnosis not present

## 2020-01-20 DIAGNOSIS — D4989 Neoplasm of unspecified behavior of other specified sites: Secondary | ICD-10-CM | POA: Diagnosis not present

## 2020-02-10 ENCOUNTER — Telehealth: Payer: Self-pay | Admitting: Family Medicine

## 2020-02-10 NOTE — Telephone Encounter (Signed)
Left message for patient to call back and schedule Medicare Annual Wellness Visit (AWV) either virtually/audio only OR in office. Whatever the patients preference is.  Last AWV2/1/19 PER [place]; please schedule at anytime with [facility]-Nurse Health Advisor at [facility].

## 2020-02-14 ENCOUNTER — Telehealth (INDEPENDENT_AMBULATORY_CARE_PROVIDER_SITE_OTHER): Payer: Medicare Other | Admitting: Family Medicine

## 2020-02-14 ENCOUNTER — Encounter: Payer: Self-pay | Admitting: Family Medicine

## 2020-02-14 DIAGNOSIS — R300 Dysuria: Secondary | ICD-10-CM

## 2020-02-14 MED ORDER — NITROFURANTOIN MONOHYD MACRO 100 MG PO CAPS: 100.0000 mg | ORAL_CAPSULE | Freq: Two times a day (BID) | ORAL | 0 refills | Status: DC

## 2020-02-14 NOTE — Progress Notes (Signed)
Virtual Visit via Video Note  I connected with Autumn Conley  on 02/14/20 at 12:20 PM EDT by a video enabled telemedicine application and verified that I am speaking with the correct person using two identifiers.  Location patient: home, [place] Location provider:work or home office Persons participating in the virtual visit: patient, provider, husband  I discussed the limitations of evaluation and management by telemedicine and the availability of in person appointments. The patient expressed understanding and agreed to proceed.   HPI:  Acute visit for Dysuria: -"I feel like I have a UTI" -started about 3-4 days ago -symptoms include urinary frequency, dysuria, urgency, cloudy urine, odor to urine -reports she has had UTI in the past and this feels the same, last UTI was > 1 year ago -denies fevers, NVD, flank pain, abd pain, hematuria -denies any allergies to antibiotics -denies any other acute illness currently or resp illness -currently on treatment for thymoma -denies any hx of kidney or liver dz  ROS: See pertinent positives and negatives per HPI.  Past Medical History:  Diagnosis Date  . Anxiety   . Arthritis    "joints" (11/29/2014)  . Autoimmune disorder ([place])    "non-specific"  . Cancer of left breast ([place])   . Depression   . Fibromyalgia    "some; not chronic" (11/29/2014)  . GERD (gastroesophageal reflux disease)   . Glaucoma of both eyes   . Headache    "weekly" (11/29/2014)  . Heart murmur   . Hypercholesteremia   . Hypertension   . IBS (irritable bowel syndrome)   . Mitral valve prolapse   . NASH (nonalcoholic steatohepatitis)   . Osteoarthritis   . Pneumonia   . PONV (postoperative nausea and vomiting)   . Sleep apnea     Past Surgical History:  Procedure Laterality Date  . ABDOMINAL HYSTERECTOMY  1980  . APPENDECTOMY  1953  . BREAST BIOPSY Left   . BREAST LUMPECTOMY Left   . CATARACT EXTRACTION, BILATERAL  2018  . MASTECTOMY Left ~ 2009  . PARASTERNAL  EXPLORATION Right 02/14/2019   Procedure: PARASTERNAL MEDIAL EXPLORATION WITH BIOPIES.;  Surgeon: [name], MD;  Location: [facility] OR;  Service: Thoracic;  Laterality: Right;    Family History  Problem Relation Age of Onset  . Lung cancer Mother 34  . Prostate cancer Brother 69    SOCIAL HX: see hpi   Current Outpatient Medications:  .  Ascorbic Acid (VITAMIN C) 1000 MG tablet, Take 1,000 mg by mouth daily., Disp: , Rfl:  .  aspirin EC 81 MG tablet, Take 81 mg by mouth daily., Disp: , Rfl:  .  atorvastatin (LIPITOR) 40 MG tablet, TAKE 1 TABLET BY MOUTH AT  BEDTIME, Disp: 90 tablet, Rfl: 3 .  cycloSPORINE (RESTASIS) 0.05 % ophthalmic emulsion, Place 1 drop into both eyes 2 (two) times daily., Disp: , Rfl:  .  gabapentin (NEURONTIN) 300 MG capsule, Take 300 mg by mouth 2 (two) times daily. , Disp: , Rfl:  .  irbesartan (AVAPRO) 150 MG tablet, TAKE 1 TABLET BY MOUTH  DAILY, Disp: 90 tablet, Rfl: 3 .  oxyCODONE (OXY IR/ROXICODONE) 5 MG immediate release tablet, Take 1 tablet (5 mg total) by mouth every 6 (six) hours as needed for severe pain., Disp: 10 tablet, Rfl: 0 .  PARoxetine (PAXIL) 20 MG tablet, TAKE 3 TABLETS BY MOUTH IN  THE MORNING, Disp: 270 tablet, Rfl: 3 .  Polyethylene Glycol 3350 (MIRALAX PO), Take by mouth as needed., Disp: , Rfl:  .  SUMAtriptan (IMITREX) 100 MG tablet, Take 1 tablet by mouth 2 (two) times daily as needed. At least 2 hrs between doses as needed bid, Disp: , Rfl:  .  nitrofurantoin, macrocrystal-monohydrate, (MACROBID) 100 MG capsule, Take 1 capsule (100 mg total) by mouth 2 (two) times daily., Disp: 14 capsule, Rfl: 0  EXAM:  VITALS per patient if applicable: denies fever  GENERAL: alert, oriented, appears well and in no acute distress  HEENT: atraumatic, conjunttiva clear, no obvious abnormalities on inspection of external nose and ears  NECK: normal movements of the head and neck  LUNGS: on inspection no signs of respiratory distress, breathing  rate appears normal, no obvious gross SOB, gasping or wheezing  CV: no obvious cyanosis  MS: moves all visible extremities without noticeable abnormality  PSYCH/NEURO: pleasant and cooperative, no obvious depression or anxiety, speech and thought processing grossly intact  ASSESSMENT AND PLAN:  Discussed the following assessment and plan:  Dysuria  -we discussed possible serious and likely etiologies, options for evaluation and workup, limitations of telemedicine visit vs in person visit, treatment, treatment risks and precautions. Pt prefers to treat via telemedicine empirically rather then risking or undertaking an in person visit at this moment. Suspect possible UTI. She opted for empiric tx with macrobid 17m bid x 7 days, plenty of water, cranberry juice, Azo if needed. Patient agrees to seek prompt in person care if worsening, new symptoms arise, or if is not improving with treatment.  She is searching for a new PCP as her PCP left the practice. She asks about available options. She is considering seeing Samantha. Advised to call to schedule visit.   I discussed the assessment and treatment plan with the patient. The patient was provided an opportunity to ask questions and all were answered. The patient agreed with the plan and demonstrated an understanding of the instructions.   The patient was advised to call back or seek an in-person evaluation if the symptoms worsen or if the condition fails to improve as anticipated.   HLucretia Kern DO

## 2020-02-27 ENCOUNTER — Encounter: Payer: Self-pay | Admitting: Physician Assistant

## 2020-02-27 ENCOUNTER — Other Ambulatory Visit: Payer: Self-pay

## 2020-02-27 ENCOUNTER — Ambulatory Visit (INDEPENDENT_AMBULATORY_CARE_PROVIDER_SITE_OTHER): Payer: Medicare Other | Admitting: Physician Assistant

## 2020-02-27 VITALS — BP 142/84 | HR 85 | Temp 97.5°F | Ht 63.0 in | Wt 171.6 lb

## 2020-02-27 DIAGNOSIS — R7989 Other specified abnormal findings of blood chemistry: Secondary | ICD-10-CM

## 2020-02-27 DIAGNOSIS — I341 Nonrheumatic mitral (valve) prolapse: Secondary | ICD-10-CM | POA: Insufficient documentation

## 2020-02-27 DIAGNOSIS — E7800 Pure hypercholesterolemia, unspecified: Secondary | ICD-10-CM | POA: Diagnosis not present

## 2020-02-27 DIAGNOSIS — E538 Deficiency of other specified B group vitamins: Secondary | ICD-10-CM | POA: Diagnosis not present

## 2020-02-27 DIAGNOSIS — H409 Unspecified glaucoma: Secondary | ICD-10-CM | POA: Insufficient documentation

## 2020-02-27 DIAGNOSIS — E559 Vitamin D deficiency, unspecified: Secondary | ICD-10-CM | POA: Diagnosis not present

## 2020-02-27 DIAGNOSIS — F419 Anxiety disorder, unspecified: Secondary | ICD-10-CM | POA: Diagnosis not present

## 2020-02-27 DIAGNOSIS — Z853 Personal history of malignant neoplasm of breast: Secondary | ICD-10-CM | POA: Diagnosis not present

## 2020-02-27 DIAGNOSIS — M8588 Other specified disorders of bone density and structure, other site: Secondary | ICD-10-CM

## 2020-02-27 DIAGNOSIS — K7581 Nonalcoholic steatohepatitis (NASH): Secondary | ICD-10-CM | POA: Insufficient documentation

## 2020-02-27 DIAGNOSIS — M1990 Unspecified osteoarthritis, unspecified site: Secondary | ICD-10-CM | POA: Insufficient documentation

## 2020-02-27 LAB — LIPID PANEL
Cholesterol: 181 mg/dL (ref 0–200)
HDL: 38.6 mg/dL — ABNORMAL LOW (ref >39.00)
LDL Cholesterol: 111 mg/dL — ABNORMAL HIGH (ref 0–99)
NonHDL: 142.54
Total CHOL/HDL Ratio: 5
Triglycerides: 159 mg/dL — ABNORMAL HIGH (ref 0.0–149.0)
VLDL: 31.8 mg/dL (ref 0.0–40.0)

## 2020-02-27 LAB — COMPREHENSIVE METABOLIC PANEL
ALT: 44 U/L — ABNORMAL HIGH (ref 0–35)
AST: 35 U/L (ref 0–37)
Albumin: 4.3 g/dL (ref 3.5–5.2)
Alkaline Phosphatase: 126 U/L — ABNORMAL HIGH (ref 39–117)
BUN: 18 mg/dL (ref 6–23)
CO2: 28 mEq/L (ref 19–32)
Calcium: 9.6 mg/dL (ref 8.4–10.5)
Chloride: 103 mEq/L (ref 96–112)
Creatinine, Ser: 0.76 mg/dL (ref 0.40–1.20)
GFR: 74.29 mL/min (ref >60.00)
Glucose, Bld: 108 mg/dL — ABNORMAL HIGH (ref 70–99)
Potassium: 4.8 mEq/L (ref 3.5–5.1)
Sodium: 136 mEq/L (ref 135–145)
Total Bilirubin: 0.3 mg/dL (ref 0.2–1.2)
Total Protein: 6.8 g/dL (ref 6.0–8.3)

## 2020-02-27 LAB — VITAMIN D 25 HYDROXY (VIT D DEFICIENCY, FRACTURES): VITD: 36.03 ng/mL (ref 30.00–100.00)

## 2020-02-27 LAB — VITAMIN B12: Vitamin B-12: 239 pg/mL (ref 211–911)

## 2020-02-27 MED ORDER — SUMATRIPTAN SUCCINATE 100 MG PO TABS: 100.0000 mg | ORAL_TABLET | Freq: Two times a day (BID) | ORAL | 3 refills | Status: DC | PRN

## 2020-02-27 MED ORDER — ALPRAZOLAM 0.25 MG PO TABS: 0.1250 mg | ORAL_TABLET | Freq: Every day | ORAL | 0 refills | Status: AC | PRN

## 2020-02-27 MED ORDER — BUTALBITAL-APAP-CAFFEINE 50-325-40 MG PO TABS: 1.0000 | ORAL_TABLET | Freq: Four times a day (QID) | ORAL | 0 refills | Status: DC | PRN

## 2020-02-27 NOTE — Progress Notes (Addendum)
Autumn Conley is a 74 y.o. female is here to discuss:  Paperwork for [employer] completed  I acted as a Education administrator for [employer], PA-C [place], [place]  History of Present Illness:   Chief Complaint  Patient presents with  . Breast Cancer    HPI    Hx of breast cancer, s/p mastectomy in 2009 Pt needs medical supplies for a masectomy due to breast cancer. She is in need of a mastectomy bra and breast prosthesis w/o adhesive to help with comfort, support, and to help with her daily living.   Migraines Takes Fioricet as needed and if this is insufficient, she will take Imitrex 100 mg tablet as needed. Works well for her.  Has been on this regimen for several years.   Anxiety Has had increased anxiety recently -- specifically in regards to being a passenger in the car. This has limited her ability to travel. She is currently on Paxil 20 mg and has been for several years. She has never been on prn medication.   Vitamin D deficiency Vitamin D deficiency hx. Has hx of requiring high dose Vit D (50,000 IU). Has not been on this for awhile.   Osteopenia of lumbar Last DEXA in Feb 2019. She has never been on medication. She cannot tolerate oral calcium as it causes constipation.   Vitamin B12 deficiency History of this. Has done B12 injections in the past and is wondering if she needs to update her levels to see if she needs to repeat them.   Elevated LFTs/NASH History of this. Most recent CMP shows almost normal labs. Denies unusual abdominal pain or concerning alcohol intake. Had a abd CT in Jan that showed hepatic steatosis.  HLD Currently on lipitor 40 mg daily and tolerating well. She is due for a lipid panel today.  Health Maintenance Due  Topic Date Due  . COLONOSCOPY  Never done  . PNA vac Low Risk Adult (2 of 2 - PPSV23) 08/15/2016    Past Medical History:  Diagnosis Date  . Anxiety   . Depression   . Fibromyalgia    "some; not chronic" (11/29/2014)  . GERD  (gastroesophageal reflux disease)   . Glaucoma of both eyes   . IBS (irritable bowel syndrome)   . Mitral valve prolapse   . NASH (nonalcoholic steatohepatitis)   . Osteoarthritis   . PONV (postoperative nausea and vomiting)      Social History   Socioeconomic History  . Marital status: Married    Spouse name: Not on file  . Number of children: 1  . Years of education: 25  . Highest education level: Not on file  Occupational History  . Occupation: Retired  Tobacco Use  . Smoking status: Never Smoker  . Smokeless tobacco: Never Used  Substance and Sexual Activity  . Alcohol use: No  . Drug use: No  . Sexual activity: Never  Other Topics Concern  . Not on file  Social History Narrative   Lives at home with her husband.   Right-handed.   2 cups caffeine/day.   Social Determinants of Health   Financial Resource Strain:   . Difficulty of Paying Living Expenses:   Food Insecurity:   . Worried About Charity fundraiser in the Last Year:   . Arboriculturist in the Last Year:   Transportation Needs:   . Film/video editor (Medical):   Marland Kitchen Lack of Transportation (Non-Medical):   Physical Activity:   . Days  of Exercise per Week:   . Minutes of Exercise per Session:   Stress:   . Feeling of Stress :   Social Connections:   . Frequency of Communication with Friends and Family:   . Frequency of Social Gatherings with Friends and Family:   . Attends Religious Services:   . Active Member of Clubs or Organizations:   . Attends Archivist Meetings:   Marland Kitchen Marital Status:   Intimate Partner Violence:   . Fear of Current or Ex-Partner:   . Emotionally Abused:   Marland Kitchen Physically Abused:   . Sexually Abused:     Past Surgical History:  Procedure Laterality Date  . ABDOMINAL HYSTERECTOMY  1980  . APPENDECTOMY  1953  . BREAST BIOPSY Left   . BREAST LUMPECTOMY Left   . CATARACT EXTRACTION, BILATERAL  2018  . MASTECTOMY Left ~ 2009  . PARASTERNAL EXPLORATION Right  02/14/2019   Procedure: PARASTERNAL MEDIAL EXPLORATION WITH BIOPIES.;  Surgeon: [name], MD;  Location: [facility] OR;  Service: Thoracic;  Laterality: Right;    Family History  Problem Relation Age of Onset  . Lung cancer Mother 16  . Prostate cancer Brother 32    PMHx, SurgHx, SocialHx, FamHx, Medications, and Allergies were reviewed in the Visit Navigator and updated as appropriate.   Patient Active Problem List   Diagnosis Date Noted  . Glaucoma of both eyes   . NASH (nonalcoholic steatohepatitis)   . Mitral valve prolapse   . Osteoarthritis   . Thymoma -- managed by [facility] - extensive documentation in Care Everywhere 03/22/2019  . Mass of upper lobe of right lung 01/20/2019  . B12 deficiency 12/19/2018  . Stress incontinence in female 12/19/2018  . Dyslipidemia 12/14/2018  . OSA (obstructive sleep apnea) 12/07/2018  . History of breast cancer 12/07/2018  . Cognitive complaints 11/25/2018  . False positive stress test 01/29/2015  . Migraine without aura and without status migrainosus, not intractable 12/18/2014  . Small vessel disease, cerebrovascular 12/18/2014  . GERD (gastroesophageal reflux disease) 12/02/2014  . HTN (hypertension) 11/29/2014  . Anxiety 12/27/2013  . Depression 11/03/2012  . Elevated LFTs 11/03/2012  . Fibromyalgia 11/03/2012  . IBS (irritable bowel syndrome) 11/03/2012  . Lymphocytic colitis 11/03/2012  . Paget's disease of nipple ([place]) 11/03/2012  . Vitamin D deficiency 11/03/2012    Social History   Tobacco Use  . Smoking status: Never Smoker  . Smokeless tobacco: Never Used  Substance Use Topics  . Alcohol use: No  . Drug use: No    Current Medications and Allergies:    Current Outpatient Medications:  .  Ascorbic Acid (VITAMIN C) 1000 MG tablet, Take 1,000 mg by mouth daily., Disp: , Rfl:  .  aspirin EC 81 MG tablet, Take 81 mg by mouth daily., Disp: , Rfl:  .  atorvastatin (LIPITOR) 40 MG tablet, TAKE 1 TABLET BY MOUTH AT   BEDTIME, Disp: 90 tablet, Rfl: 3 .  cycloSPORINE (RESTASIS) 0.05 % ophthalmic emulsion, Place 1 drop into both eyes 2 (two) times daily., Disp: , Rfl:  .  gabapentin (NEURONTIN) 300 MG capsule, Take 300 mg by mouth 2 (two) times daily. , Disp: , Rfl:  .  irbesartan (AVAPRO) 150 MG tablet, TAKE 1 TABLET BY MOUTH  DAILY, Disp: 90 tablet, Rfl: 3 .  oxyCODONE (OXY IR/ROXICODONE) 5 MG immediate release tablet, Take 1 tablet (5 mg total) by mouth every 6 (six) hours as needed for severe pain., Disp: 10 tablet, Rfl: 0 .  PARoxetine (PAXIL) 20 MG tablet, TAKE 3 TABLETS BY MOUTH IN  THE MORNING, Disp: 270 tablet, Rfl: 3 .  Polyethylene Glycol 3350 (MIRALAX PO), Take by mouth as needed., Disp: , Rfl:  .  SUMAtriptan (IMITREX) 100 MG tablet, Take 1 tablet (100 mg total) by mouth 2 (two) times daily as needed. At least 2 hrs between doses as needed bid, Disp: 10 tablet, Rfl: 3 .  ALPRAZolam (XANAX) 0.25 MG tablet, Take 0.5 tablets (0.125 mg total) by mouth daily as needed for anxiety., Disp: 30 tablet, Rfl: 0 .  butalbital-acetaminophen-caffeine (FIORICET) 50-325-40 MG tablet, Take 1-2 tablets by mouth every 6 (six) hours as needed for headache., Disp: 20 tablet, Rfl: 0  No Known Allergies  Review of Systems   ROS  Negative unless otherwise specified per HPI.  Vitals:   Vitals:   02/27/20 0902  BP: (!) 142/84  Pulse: 85  Temp: (!) 97.5 F (36.4 C)  TempSrc: Temporal  SpO2: 97%  Weight: 171 lb 9.6 oz (77.8 kg)  Height: 5' 3"  (1.6 m)     Body mass index is 30.4 kg/m.   Physical Exam:    Physical Exam Vitals and nursing note reviewed.  Constitutional:      General: She is not in acute distress.    Appearance: She is well-developed. She is not ill-appearing or toxic-appearing.  Cardiovascular:     Rate and Rhythm: Normal rate and regular rhythm.     Pulses: Normal pulses.     Heart sounds: Normal heart sounds, S1 normal and S2 normal.     Comments: No LE edema Pulmonary:     Effort:  Pulmonary effort is normal.     Breath sounds: Normal breath sounds.  Skin:    General: Skin is warm and dry.  Neurological:     Mental Status: She is alert.     GCS: GCS eye subscore is 4. GCS verbal subscore is 5. GCS motor subscore is 6.  Psychiatric:        Speech: Speech normal.        Behavior: Behavior normal. Behavior is cooperative.      Assessment and Plan:    Ita was seen today for breast cancer.  Diagnoses and all orders for this visit:  Vitamin D deficiency Update labs today and determine need for supplement refill. -     VITAMIN D 25 Hydroxy (Vit-D Deficiency, Fractures)  Elevated LFTs No symptoms, update labs today. Will determine need for further work-up if indicated. -     Comprehensive metabolic panel  Z61 deficiency Update labs today and determine need for supplement refill. -     Vitamin B12  Pure hypercholesterolemia Update lipid panel and determine need for change in therapy. -     Lipid panel  Other specified disorders of bone density and structure, other site  DEXA ordered -     DG Bone Density; Future   Hx breast cancer Patient is in need for mastectomy bra and breast prosthesis. I will have our staff fax form stating this.   Anxiety Uncontrolled. Continue paxil. Will trial prn xanax, 1/2 tab of 0.25 mg as needed. Risks of medication discussed including: sedation, falls, addiction. Patient verbalized understanding of risks and is agreeable to plan. Follow-up in 6 months, sooner if concerns.    Other orders -     butalbital-acetaminophen-caffeine (FIORICET) 50-325-40 MG tablet; Take 1-2 tablets by mouth every 6 (six) hours as needed for headache. -     SUMAtriptan (IMITREX)  100 MG tablet; Take 1 tablet (100 mg total) by mouth 2 (two) times daily as needed. At least 2 hrs between doses as needed bid -     ALPRAZolam (XANAX) 0.25 MG tablet; Take 0.5 tablets (0.125 mg total) by mouth daily as needed for anxiety.    . Reviewed  expectations re: course of current medical issues. . Discussed self-management of symptoms. . Outlined signs and symptoms indicating need for more acute intervention. . Patient verbalized understanding and all questions were answered. . See orders for this visit as documented in the electronic medical record. . Patient received an After Visit Summary.  CMA or LPN served as scribe during this visit. History, Physical, and Plan performed by medical provider. The above documentation has been reviewed and is accurate and complete.   Autumn Coke, PA-C [place], [place] 02/27/2020  Follow-up: No follow-ups on file.

## 2020-02-27 NOTE — Patient Instructions (Signed)
It was great to see you!  We will be in touch once we hear back from the [facility].  I will be in touch via Mychart with your lab results. Refills have been sent to your mail order pharmacy.  On the way out of the office: 1. Cancel your transfer of care appointment 2. Schedule a follow-up with me in 6 months 3. Schedule your DEXA scan  Take care,  [name] PA-C

## 2020-02-28 ENCOUNTER — Telehealth: Payer: Self-pay

## 2020-02-28 NOTE — Telephone Encounter (Signed)
Patient returning missed call regarding B-12 injections.

## 2020-02-29 ENCOUNTER — Other Ambulatory Visit: Payer: Self-pay

## 2020-02-29 ENCOUNTER — Ambulatory Visit (INDEPENDENT_AMBULATORY_CARE_PROVIDER_SITE_OTHER)
Admission: RE | Admit: 2020-02-29 | Discharge: 2020-02-29 | Disposition: A | Discharge status: Home / Self Care | Payer: Medicare Other | Source: Ambulatory Visit | Attending: Physician Assistant | Admitting: Physician Assistant

## 2020-02-29 DIAGNOSIS — M8588 Other specified disorders of bone density and structure, other site: Secondary | ICD-10-CM

## 2020-02-29 NOTE — Telephone Encounter (Signed)
Patient returned Donna's call and would like to have the B12 injections sent to [place], the patient states her daughter is the one who will administer them to her.

## 2020-02-29 NOTE — Telephone Encounter (Signed)
Left message on personal voicemail if you want to continue Vit B12 injections please call the office and schedule a nurse visit. If you have questions you can ask to speak to nurse. Please call office.

## 2020-03-01 ENCOUNTER — Telehealth: Payer: Self-pay

## 2020-03-01 MED ORDER — CYANOCOBALAMIN 1000 MCG/ML IJ SOLN: 1000.0000 ug | INTRAMUSCULAR | 2 refills | Status: DC

## 2020-03-01 MED ORDER — "SYRINGE/NEEDLE (DISP) 25G X 1"" 3 ML MISC": 1 refills | Status: DC

## 2020-03-01 NOTE — Addendum Note (Signed)
Addended by: Marian Sorrow on: 03/01/2020 09:46 AM   Modules accepted: Orders

## 2020-03-01 NOTE — Telephone Encounter (Signed)
Spoke to pt told her I sent Rx's to [facility] for Vit B12 and syringes/needles. If any problems please let me know. Pt verbalized understanding.

## 2020-03-01 NOTE — Telephone Encounter (Signed)
Clarene Critchley From [facility] returning missed call. Best contact number is [phone]

## 2020-03-02 NOTE — Telephone Encounter (Signed)
Call on Monday.

## 2020-03-05 ENCOUNTER — Telehealth: Payer: Self-pay | Admitting: Physician Assistant

## 2020-03-05 ENCOUNTER — Other Ambulatory Visit: Payer: Self-pay

## 2020-03-05 ENCOUNTER — Encounter: Payer: Self-pay | Admitting: Physician Assistant

## 2020-03-05 ENCOUNTER — Ambulatory Visit (INDEPENDENT_AMBULATORY_CARE_PROVIDER_SITE_OTHER): Payer: Medicare Other | Admitting: Physician Assistant

## 2020-03-05 ENCOUNTER — Encounter: Payer: PRIVATE HEALTH INSURANCE | Admitting: Family Medicine

## 2020-03-05 VITALS — BP 132/74 | HR 94 | Temp 97.3°F | Ht 63.0 in | Wt 166.2 lb

## 2020-03-05 DIAGNOSIS — B001 Herpesviral vesicular dermatitis: Secondary | ICD-10-CM | POA: Diagnosis not present

## 2020-03-05 DIAGNOSIS — R319 Hematuria, unspecified: Secondary | ICD-10-CM | POA: Diagnosis not present

## 2020-03-05 LAB — POCT URINALYSIS DIPSTICK
Blood, UA: 10
Glucose, UA: NEGATIVE
Ketones, UA: NEGATIVE
Nitrite, UA: POSITIVE
Protein, UA: POSITIVE — AB
Spec Grav, UA: 1.025 (ref 1.010–1.025)
Urobilinogen, UA: 0.2 E.U./dL
pH, UA: 6 (ref 5.0–8.0)

## 2020-03-05 MED ORDER — CEPHALEXIN 500 MG PO CAPS: 500.0000 mg | ORAL_CAPSULE | Freq: Three times a day (TID) | ORAL | 0 refills | Status: AC

## 2020-03-05 MED ORDER — BUTALBITAL-APAP-CAFFEINE 50-325-40 MG PO TABS: 1.0000 | ORAL_TABLET | Freq: Four times a day (QID) | ORAL | 0 refills | Status: DC | PRN

## 2020-03-05 MED ORDER — VALACYCLOVIR HCL 1 G PO TABS: ORAL_TABLET | ORAL | 2 refills | Status: DC

## 2020-03-05 NOTE — Telephone Encounter (Signed)
Called no answer. Will try again later.

## 2020-03-05 NOTE — Patient Instructions (Signed)
It was great to see you!  I have sent the oral valtrex and fioricet to your mail order.  I have sent the new antibiotic to your local pharmacy.  General instructions  Make sure you: ? Pee until your bladder is empty. ? Do not hold pee for a long time. ? Empty your bladder after sex. ? Wipe from front to back after pooping if you are a female. Use each tissue one time when you wipe.  Drink enough fluid to keep your pee pale yellow.  Keep all follow-up visits as told by your doctor. This is important. Contact a doctor if:  You do not get better after 1-2 days.  Your symptoms go away and then come back. Get help right away if:  You have very bad back pain.  You have very bad pain in your lower belly.  You have a fever.  You are sick to your stomach (nauseous).  You are throwing up.  Take care,  [name] PA-C

## 2020-03-05 NOTE — Telephone Encounter (Signed)
Chief Complaint Urine, Blood In Reason for Call Symptomatic / Request for [place] states that she thinks she has a bladder infection. She has blood in her urine and pain when she urinated. She had an infection 2 weeks ago. She was on an antibiotic, (name unknown), but she is out. Translation No Nurse Assessment Nurse: Derrel Nip, RN, Santiago Glad Date/Time Eilene Ghazi Time): 03/03/2020 10:28:47 AM Confirm and document reason for call. If symptomatic, describe symptoms. ---Caller states that started having painful urination yesterday and this morning has blood in her urine Has the patient had close contact with a person known or suspected to have the novel coronavirus illness OR traveled / lives in area with major community spread (including international travel) in the last 14 days from the onset of symptoms? * If Asymptomatic, screen for exposure and travel within the last 14 days. ---No Does the patient have any new or worsening symptoms? ---Yes Will a triage be completed? ---Yes Related visit to physician within the last 2 weeks? ---Yes Does the PT have any chronic conditions? (i.e. diabetes, asthma, this includes High risk factors for pregnancy, etc.) ---Yes List chronic conditions. ---HTN, Chlosterol Is this a behavioral health or substance abuse call? ---No Guidelines Guideline Title Affirmed Question Affirmed Notes Nurse Date/Time (Eastern Time) Urine - Blood In Pain or burning with passing urine Derrel Nip, RN, Santiago Glad 03/03/2020 10:32:04 AM Disp. Time (Eastern Time) Disposition Final UserPLEASE NOTE: All timestamps contained within this report are represented as [place] Standard Time. CONFIDENTIALTY NOTICE: This fax transmission is intended only for the addressee. It contains information that is legally privileged, confidential or otherwise protected from use or disclosure. If you are not the intended recipient, you are strictly prohibited from reviewing,  disclosing, copying using or disseminating any of this information or taking any action in reliance on or regarding this information. If you have received this fax in error, please notify us immediately by telephone so that we can arrange for its return to [place]. Phone: [phone], Toll-Free: [phone], Fax: [phone] Page: 2 of 2 Call Id: [id number] 03/03/2020 10:34:52 AM See PCP within 24 Hours Yes Derrel Nip, RN, [place] Pellant Disagree/Comply Comply Caller Understands Yes PreDisposition Call Doctor Care Advice Given Per Guideline SEE PCP WITHIN 24 HOURS: * IF OFFICE WILL BE OPEN: You need to be seen within the next 24 hours. Call your doctor (or NP/PA) when the office opens and make an appointment. SAMPLE: CALL BACK IF: * Fever occurs * You become worse. CARE ADVICE given per Urine, Blood In (Adult) guideline. Referrals REFERRED TO PCP OFFICE

## 2020-03-05 NOTE — Progress Notes (Signed)
Autumn Conley is a 74 y.o. female here for a urinary symptoms.  I acted as a Education administrator for [employer], PA-C [employer], [place]  History of Present Illness:   Chief Complaint  Patient presents with  . Hematuria    HPI   Hematuria Patient was seen by Dr. Colin Benton on 02/14/20 for evaluation of dysuria via virtual visit. She was given oral macrobid. Patient reports that she took medication as prescribe and relief, but not resolution, of symptoms.  Saturday she developed abdominal pressure, frequency and blood clots in urine.  Had a few episodes of chills, which is unusual for her.  Denies: severe/worsening back pain, nausea, vomiting, poor appetite  Cold sores Has history of cold sores. Uses abreva and other type of "salve" -- cannot remember the name of this without significant relief of symptoms. Currently has two lesions on her lower lip that has been tender.    Past Medical History:  Diagnosis Date  . Anxiety   . Depression   . Fibromyalgia    "some; not chronic" (11/29/2014)  . GERD (gastroesophageal reflux disease)   . Glaucoma of both eyes   . IBS (irritable bowel syndrome)   . Mitral valve prolapse   . NASH (nonalcoholic steatohepatitis)   . Osteoarthritis   . PONV (postoperative nausea and vomiting)      Social History   Socioeconomic History  . Marital status: Married    Spouse name: Not on file  . Number of children: 1  . Years of education: 52  . Highest education level: Not on file  Occupational History  . Occupation: Retired  Tobacco Use  . Smoking status: Never Smoker  . Smokeless tobacco: Never Used  Substance and Sexual Activity  . Alcohol use: No  . Drug use: No  . Sexual activity: Never  Other Topics Concern  . Not on file  Social History Narrative   Lives at home with her husband.   Right-handed.   2 cups caffeine/day.   Social Determinants of Health   Financial Resource Strain:   . Difficulty of Paying Living Expenses:   Food  Insecurity:   . Worried About Charity fundraiser in the Last Year:   . Arboriculturist in the Last Year:   Transportation Needs:   . Film/video editor (Medical):   Marland Kitchen Lack of Transportation (Non-Medical):   Physical Activity:   . Days of Exercise per Week:   . Minutes of Exercise per Session:   Stress:   . Feeling of Stress :   Social Connections:   . Frequency of Communication with Friends and Family:   . Frequency of Social Gatherings with Friends and Family:   . Attends Religious Services:   . Active Member of Clubs or Organizations:   . Attends Archivist Meetings:   Marland Kitchen Marital Status:   Intimate Partner Violence:   . Fear of Current or Ex-Partner:   . Emotionally Abused:   Marland Kitchen Physically Abused:   . Sexually Abused:     Past Surgical History:  Procedure Laterality Date  . ABDOMINAL HYSTERECTOMY  1980  . APPENDECTOMY  1953  . BREAST BIOPSY Left   . BREAST LUMPECTOMY Left   . CATARACT EXTRACTION, BILATERAL  2018  . MASTECTOMY Left ~ 2009  . PARASTERNAL EXPLORATION Right 02/14/2019   Procedure: PARASTERNAL MEDIAL EXPLORATION WITH BIOPIES.;  Surgeon: [name], MD;  Location: [place];  Service: Thoracic;  Laterality: Right;    Family  History  Problem Relation Age of Onset  . Lung cancer Mother 61  . Prostate cancer Brother 60    No Known Allergies  Current Medications:   Current Outpatient Medications:  .  ALPRAZolam (XANAX) 0.25 MG tablet, Take 0.5 tablets (0.125 mg total) by mouth daily as needed for anxiety., Disp: 30 tablet, Rfl: 0 .  Ascorbic Acid (VITAMIN C) 1000 MG tablet, Take 1,000 mg by mouth daily., Disp: , Rfl:  .  aspirin EC 81 MG tablet, Take 81 mg by mouth daily., Disp: , Rfl:  .  atorvastatin (LIPITOR) 40 MG tablet, TAKE 1 TABLET BY MOUTH AT  BEDTIME, Disp: 90 tablet, Rfl: 3 .  butalbital-acetaminophen-caffeine (FIORICET) 50-325-40 MG tablet, Take 1-2 tablets by mouth every 6 (six) hours as needed for headache., Disp: 20 tablet,  Rfl: 0 .  cyanocobalamin (,VITAMIN B-12,) 1000 MCG/ML injection, Inject 1 mL (1,000 mcg total) into the muscle every 30 (thirty) days., Disp: 3 mL, Rfl: 2 .  cycloSPORINE (RESTASIS) 0.05 % ophthalmic emulsion, Place 1 drop into both eyes 2 (two) times daily., Disp: , Rfl:  .  gabapentin (NEURONTIN) 300 MG capsule, Take 300 mg by mouth 2 (two) times daily. , Disp: , Rfl:  .  irbesartan (AVAPRO) 150 MG tablet, TAKE 1 TABLET BY MOUTH  DAILY, Disp: 90 tablet, Rfl: 3 .  oxyCODONE (OXY IR/ROXICODONE) 5 MG immediate release tablet, Take 1 tablet (5 mg total) by mouth every 6 (six) hours as needed for severe pain., Disp: 10 tablet, Rfl: 0 .  PARoxetine (PAXIL) 20 MG tablet, TAKE 3 TABLETS BY MOUTH IN  THE MORNING, Disp: 270 tablet, Rfl: 3 .  Polyethylene Glycol 3350 (MIRALAX PO), Take by mouth as needed., Disp: , Rfl:  .  SUMAtriptan (IMITREX) 100 MG tablet, Take 1 tablet (100 mg total) by mouth 2 (two) times daily as needed. At least 2 hrs between doses as needed bid, Disp: 10 tablet, Rfl: 3 .  SYRINGE-NEEDLE, DISP, 3 ML 25G X 1" 3 ML MISC, Use to inject Vit B12 once a month., Disp: 12 each, Rfl: 1 .  cephALEXin (KEFLEX) 500 MG capsule, Take 1 capsule (500 mg total) by mouth 3 (three) times daily for 5 days., Disp: 15 capsule, Rfl: 0 .  valACYclovir (VALTREX) 1000 MG tablet, Take two tablets ( total 2000 mg) by mouth q12h x 1 day; Start: ASAP after symptom onset, Disp: 6 tablet, Rfl: 2   Review of Systems:   ROS  Negative unless otherwise specified per HPI.  Vitals:   Vitals:   03/05/20 1412  BP: 132/74  Pulse: 94  Temp: (!) 97.3 F (36.3 C)  TempSrc: Temporal  SpO2: 98%  Weight: 166 lb 3.2 oz (75.4 kg)  Height: 5' 3"  (1.6 m)     Body mass index is 29.44 kg/m.  Physical Exam:   Physical Exam Vitals and nursing note reviewed.  Constitutional:      General: She is not in acute distress.    Appearance: She is well-developed. She is not ill-appearing or toxic-appearing.  Cardiovascular:      Rate and Rhythm: Normal rate and regular rhythm.     Pulses: Normal pulses.     Heart sounds: Normal heart sounds, S1 normal and S2 normal.     Comments: No LE edema Pulmonary:     Effort: Pulmonary effort is normal.     Breath sounds: Normal breath sounds.  Abdominal:     Tenderness: There is no right CVA tenderness or left CVA  tenderness.  Skin:    General: Skin is warm and dry.     Comments: Erythematous lesion to left lower bottom lip  Neurological:     Mental Status: She is alert.     GCS: GCS eye subscore is 4. GCS verbal subscore is 5. GCS motor subscore is 6.  Psychiatric:        Speech: Speech normal.        Behavior: Behavior normal. Behavior is cooperative.     Results for orders placed or performed in visit on 03/05/20  POCT Urinalysis Dipstick  Result Value Ref Range   Color, UA Yellow    Clarity, UA Turbid    Glucose, UA Negative Negative   Bilirubin, UA 1+    Ketones, UA Negative    Spec Grav, UA 1.025 1.010 - 1.025   Blood, UA 10    pH, UA 6.0 5.0 - 8.0   Protein, UA Positive (A) Negative   Urobilinogen, UA 0.2 0.2 or 1.0 E.U./dL   Nitrite, UA Positive    Leukocytes, UA Large (3+) (A) Negative   Appearance     Odor      Assessment and Plan:   Terrina was seen today for hematuria.  Diagnoses and all orders for this visit:  Hematuria, unspecified type UA and symptoms concerning for acute cystitis. Will trial oral keflex while we await urine culture results. Follow-up if symptoms worsen -- strict worsening precautions advised in the interim. -     POCT Urinalysis Dipstick -     Urine Culture  Cold sore Will trial oral valtrex. Follow-up if no improvement of symptoms.  Other orders -     cephALEXin (KEFLEX) 500 MG capsule; Take 1 capsule (500 mg total) by mouth 3 (three) times daily for 5 days. -     valACYclovir (VALTREX) 1000 MG tablet; Take two tablets ( total 2000 mg) by mouth q12h x 1 day; Start: ASAP after symptom onset -      butalbital-acetaminophen-caffeine (FIORICET) 50-325-40 MG tablet; Take 1-2 tablets by mouth every 6 (six) hours as needed for headache.  . Reviewed expectations re: course of current medical issues. . Discussed self-management of symptoms. . Outlined signs and symptoms indicating need for more acute intervention. . Patient verbalized understanding and all questions were answered. . See orders for this visit as documented in the electronic medical record. . Patient received an After-Visit Summary.  CMA or LPN served as scribe during this visit. History, Physical, and Plan performed by medical provider. The above documentation has been reviewed and is accurate and complete.  [name], PA-C

## 2020-03-05 NOTE — Telephone Encounter (Signed)
Forms faxed to [facility] on 03/01/20.

## 2020-03-05 NOTE — Telephone Encounter (Signed)
Called pt and scheduled appt

## 2020-03-05 NOTE — Telephone Encounter (Signed)
Please schedule an OV for patient to be seen today.

## 2020-03-07 LAB — URINE CULTURE
MICRO NUMBER:: 10485376
SPECIMEN QUALITY:: ADEQUATE

## 2020-03-20 DIAGNOSIS — C37 Malignant neoplasm of thymus: Secondary | ICD-10-CM | POA: Diagnosis not present

## 2020-03-20 DIAGNOSIS — R0600 Dyspnea, unspecified: Secondary | ICD-10-CM | POA: Diagnosis not present

## 2020-03-20 DIAGNOSIS — R0789 Other chest pain: Secondary | ICD-10-CM | POA: Diagnosis not present

## 2020-03-20 DIAGNOSIS — Z923 Personal history of irradiation: Secondary | ICD-10-CM | POA: Diagnosis not present

## 2020-03-20 DIAGNOSIS — R5383 Other fatigue: Secondary | ICD-10-CM | POA: Diagnosis not present

## 2020-03-20 DIAGNOSIS — D4989 Neoplasm of unspecified behavior of other specified sites: Secondary | ICD-10-CM | POA: Diagnosis not present

## 2020-03-21 ENCOUNTER — Encounter: Payer: Medicare Other | Admitting: Physician Assistant

## 2020-04-20 ENCOUNTER — Telehealth: Payer: Self-pay

## 2020-04-20 NOTE — Telephone Encounter (Signed)
Pt called and wanted to let [name] know that [facility] did not think the symptoms she has been experiencing lately is due to her cancer. They believe it could be related to her heart issues. [facility] has told her to ask her primary if she should be seen. Please advise

## 2020-04-20 NOTE — Telephone Encounter (Signed)
Please schedule appointment with pt.   Thank You.

## 2020-05-01 ENCOUNTER — Encounter: Payer: Self-pay | Admitting: Physician Assistant

## 2020-05-01 ENCOUNTER — Other Ambulatory Visit: Payer: Self-pay

## 2020-05-01 ENCOUNTER — Telehealth: Payer: Self-pay | Admitting: *Deleted

## 2020-05-01 ENCOUNTER — Ambulatory Visit (HOSPITAL_BASED_OUTPATIENT_CLINIC_OR_DEPARTMENT_OTHER): Payer: Medicare Other

## 2020-05-01 ENCOUNTER — Emergency Department (HOSPITAL_COMMUNITY): Payer: Medicare Other

## 2020-05-01 ENCOUNTER — Ambulatory Visit (INDEPENDENT_AMBULATORY_CARE_PROVIDER_SITE_OTHER): Payer: Medicare Other | Admitting: Physician Assistant

## 2020-05-01 ENCOUNTER — Inpatient Hospital Stay (HOSPITAL_COMMUNITY)
Admission: EM | Admit: 2020-05-01 | Discharge: 2020-05-03 | DRG: 378 | Disposition: A | Discharge status: Home / Self Care | Payer: Medicare Other | Attending: Internal Medicine | Admitting: Internal Medicine

## 2020-05-01 ENCOUNTER — Encounter (HOSPITAL_COMMUNITY): Payer: Self-pay

## 2020-05-01 VITALS — BP 118/62 | HR 96 | Temp 97.3°F | Ht 63.0 in | Wt 167.4 lb

## 2020-05-01 DIAGNOSIS — D62 Acute posthemorrhagic anemia: Secondary | ICD-10-CM | POA: Diagnosis not present

## 2020-05-01 DIAGNOSIS — K2961 Other gastritis with bleeding: Principal | ICD-10-CM | POA: Diagnosis present

## 2020-05-01 DIAGNOSIS — E663 Overweight: Secondary | ICD-10-CM | POA: Diagnosis present

## 2020-05-01 DIAGNOSIS — R0602 Shortness of breath: Secondary | ICD-10-CM

## 2020-05-01 DIAGNOSIS — R739 Hyperglycemia, unspecified: Secondary | ICD-10-CM | POA: Diagnosis present

## 2020-05-01 DIAGNOSIS — R0600 Dyspnea, unspecified: Secondary | ICD-10-CM

## 2020-05-01 DIAGNOSIS — F419 Anxiety disorder, unspecified: Secondary | ICD-10-CM | POA: Diagnosis present

## 2020-05-01 DIAGNOSIS — Z923 Personal history of irradiation: Secondary | ICD-10-CM

## 2020-05-01 DIAGNOSIS — R42 Dizziness and giddiness: Secondary | ICD-10-CM

## 2020-05-01 DIAGNOSIS — Z20822 Contact with and (suspected) exposure to covid-19: Secondary | ICD-10-CM | POA: Diagnosis not present

## 2020-05-01 DIAGNOSIS — C37 Malignant neoplasm of thymus: Secondary | ICD-10-CM | POA: Diagnosis not present

## 2020-05-01 DIAGNOSIS — K581 Irritable bowel syndrome with constipation: Secondary | ICD-10-CM | POA: Diagnosis present

## 2020-05-01 DIAGNOSIS — C7800 Secondary malignant neoplasm of unspecified lung: Secondary | ICD-10-CM | POA: Diagnosis present

## 2020-05-01 DIAGNOSIS — Z801 Family history of malignant neoplasm of trachea, bronchus and lung: Secondary | ICD-10-CM

## 2020-05-01 DIAGNOSIS — R296 Repeated falls: Secondary | ICD-10-CM | POA: Diagnosis present

## 2020-05-01 DIAGNOSIS — Z853 Personal history of malignant neoplasm of breast: Secondary | ICD-10-CM

## 2020-05-01 DIAGNOSIS — D649 Anemia, unspecified: Secondary | ICD-10-CM | POA: Diagnosis present

## 2020-05-01 DIAGNOSIS — D4989 Neoplasm of unspecified behavior of other specified sites: Secondary | ICD-10-CM | POA: Diagnosis present

## 2020-05-01 DIAGNOSIS — N83202 Unspecified ovarian cyst, left side: Secondary | ICD-10-CM | POA: Diagnosis present

## 2020-05-01 DIAGNOSIS — D122 Benign neoplasm of ascending colon: Secondary | ICD-10-CM

## 2020-05-01 DIAGNOSIS — K7581 Nonalcoholic steatohepatitis (NASH): Secondary | ICD-10-CM | POA: Diagnosis present

## 2020-05-01 DIAGNOSIS — G4733 Obstructive sleep apnea (adult) (pediatric): Secondary | ICD-10-CM | POA: Diagnosis present

## 2020-05-01 DIAGNOSIS — N390 Urinary tract infection, site not specified: Secondary | ICD-10-CM | POA: Diagnosis not present

## 2020-05-01 DIAGNOSIS — Z7982 Long term (current) use of aspirin: Secondary | ICD-10-CM

## 2020-05-01 DIAGNOSIS — Z8673 Personal history of transient ischemic attack (TIA), and cerebral infarction without residual deficits: Secondary | ICD-10-CM

## 2020-05-01 DIAGNOSIS — R5383 Other fatigue: Secondary | ICD-10-CM

## 2020-05-01 DIAGNOSIS — I10 Essential (primary) hypertension: Secondary | ICD-10-CM | POA: Diagnosis present

## 2020-05-01 DIAGNOSIS — Z87440 Personal history of urinary (tract) infections: Secondary | ICD-10-CM

## 2020-05-01 DIAGNOSIS — Z6829 Body mass index (BMI) 29.0-29.9, adult: Secondary | ICD-10-CM

## 2020-05-01 DIAGNOSIS — F329 Major depressive disorder, single episode, unspecified: Secondary | ICD-10-CM | POA: Diagnosis present

## 2020-05-01 DIAGNOSIS — K219 Gastro-esophageal reflux disease without esophagitis: Secondary | ICD-10-CM | POA: Diagnosis present

## 2020-05-01 DIAGNOSIS — Z90710 Acquired absence of both cervix and uterus: Secondary | ICD-10-CM

## 2020-05-01 DIAGNOSIS — K2960 Other gastritis without bleeding: Secondary | ICD-10-CM

## 2020-05-01 DIAGNOSIS — Z91040 Latex allergy status: Secondary | ICD-10-CM

## 2020-05-01 DIAGNOSIS — R194 Change in bowel habit: Secondary | ICD-10-CM

## 2020-05-01 DIAGNOSIS — K314 Gastric diverticulum: Secondary | ICD-10-CM | POA: Diagnosis present

## 2020-05-01 DIAGNOSIS — E785 Hyperlipidemia, unspecified: Secondary | ICD-10-CM | POA: Diagnosis present

## 2020-05-01 DIAGNOSIS — R195 Other fecal abnormalities: Secondary | ICD-10-CM

## 2020-05-01 DIAGNOSIS — Z862 Personal history of diseases of the blood and blood-forming organs and certain disorders involving the immune mechanism: Secondary | ICD-10-CM | POA: Diagnosis not present

## 2020-05-01 DIAGNOSIS — R531 Weakness: Secondary | ICD-10-CM

## 2020-05-01 DIAGNOSIS — H409 Unspecified glaucoma: Secondary | ICD-10-CM | POA: Diagnosis present

## 2020-05-01 DIAGNOSIS — Z8042 Family history of malignant neoplasm of prostate: Secondary | ICD-10-CM

## 2020-05-01 DIAGNOSIS — Z9221 Personal history of antineoplastic chemotherapy: Secondary | ICD-10-CM

## 2020-05-01 DIAGNOSIS — D72819 Decreased white blood cell count, unspecified: Secondary | ICD-10-CM | POA: Diagnosis present

## 2020-05-01 DIAGNOSIS — Z79899 Other long term (current) drug therapy: Secondary | ICD-10-CM

## 2020-05-01 DIAGNOSIS — Z9012 Acquired absence of left breast and nipple: Secondary | ICD-10-CM

## 2020-05-01 DIAGNOSIS — M797 Fibromyalgia: Secondary | ICD-10-CM | POA: Diagnosis present

## 2020-05-01 HISTORY — DX: Dyspnea, unspecified: R06.00

## 2020-05-01 HISTORY — DX: Malignant neoplasm of thymus: C37

## 2020-05-01 HISTORY — DX: Anemia, unspecified: D64.9

## 2020-05-01 HISTORY — DX: Cerebral infarction, unspecified: I63.9

## 2020-05-01 LAB — URINALYSIS, ROUTINE W REFLEX MICROSCOPIC
Bilirubin Urine: NEGATIVE
Hgb urine dipstick: NEGATIVE
Ketones, ur: NEGATIVE
Nitrite: POSITIVE — AB
RBC / HPF: NONE SEEN (ref 0–?)
Specific Gravity, Urine: 1.01 (ref 1.000–1.030)
Total Protein, Urine: NEGATIVE
Urine Glucose: NEGATIVE
Urobilinogen, UA: 1 (ref 0.0–1.0)
pH: 6.5 (ref 5.0–8.0)

## 2020-05-01 LAB — COMPREHENSIVE METABOLIC PANEL
ALT: 45 U/L — ABNORMAL HIGH (ref 0–35)
ALT: 50 U/L — ABNORMAL HIGH (ref 0–44)
AST: 45 U/L — ABNORMAL HIGH (ref 0–37)
AST: 57 U/L — ABNORMAL HIGH (ref 15–41)
Albumin: 4.6 g/dL (ref 3.5–5.2)
Albumin: 3.8 g/dL (ref 3.5–5.0)
Alkaline Phosphatase: 116 U/L (ref 39–117)
Alkaline Phosphatase: 106 U/L (ref 38–126)
Anion gap: 12 (ref 5–15)
BUN: 19 mg/dL (ref 6–23)
BUN: 16 mg/dL (ref 8–23)
CO2: 27 mEq/L (ref 19–32)
CO2: 25 mmol/L (ref 22–32)
Calcium: 9.7 mg/dL (ref 8.4–10.5)
Calcium: 9.3 mg/dL (ref 8.9–10.3)
Chloride: 100 mEq/L (ref 96–112)
Chloride: 100 mmol/L (ref 98–111)
Creatinine, Ser: 0.84 mg/dL (ref 0.40–1.20)
Creatinine, Ser: 1 mg/dL (ref 0.44–1.00)
GFR calc Af Amer: >60 mL/min (ref >60)
GFR calc non Af Amer: 55 mL/min — ABNORMAL LOW (ref >60)
GFR: 66.15 mL/min (ref >60.00)
Glucose, Bld: 105 mg/dL — ABNORMAL HIGH (ref 70–99)
Glucose, Bld: 184 mg/dL — ABNORMAL HIGH (ref 70–99)
Potassium: 4.7 mEq/L (ref 3.5–5.1)
Potassium: 4.1 mmol/L (ref 3.5–5.1)
Sodium: 136 mEq/L (ref 135–145)
Sodium: 137 mmol/L (ref 135–145)
Total Bilirubin: 0.4 mg/dL (ref 0.2–1.2)
Total Bilirubin: 0.2 mg/dL — ABNORMAL LOW (ref 0.3–1.2)
Total Protein: 7.1 g/dL (ref 6.0–8.3)
Total Protein: 6.8 g/dL (ref 6.5–8.1)

## 2020-05-01 LAB — CBC
HCT: 23.3 % — ABNORMAL LOW (ref 36.0–46.0)
Hemoglobin: 7.1 g/dL — ABNORMAL LOW (ref 12.0–15.0)
MCH: 27.6 pg (ref 26.0–34.0)
MCHC: 30.5 g/dL (ref 30.0–36.0)
MCV: 90.7 fL (ref 80.0–100.0)
Platelets: 235 10*3/uL (ref 150–400)
RBC: 2.57 MIL/uL — ABNORMAL LOW (ref 3.87–5.11)
RDW: 15.7 % — ABNORMAL HIGH (ref 11.5–15.5)
WBC: 4.2 10*3/uL (ref 4.0–10.5)
nRBC: 0 % (ref 0.0–0.2)

## 2020-05-01 LAB — PREPARE RBC (CROSSMATCH)

## 2020-05-01 LAB — CBC WITH DIFFERENTIAL/PLATELET
Basophils Absolute: 0.1 10*3/uL (ref 0.0–0.1)
Basophils Relative: 1.4 % (ref 0.0–3.0)
Eosinophils Absolute: 0.1 10*3/uL (ref 0.0–0.7)
Eosinophils Relative: 2.5 % (ref 0.0–5.0)
HCT: 23.2 % — CL (ref 36.0–46.0)
Hemoglobin: 7.6 g/dL — CL (ref 12.0–15.0)
Lymphocytes Relative: 15.1 % (ref 12.0–46.0)
Lymphs Abs: 0.7 10*3/uL (ref 0.7–4.0)
MCHC: 32.8 g/dL (ref 30.0–36.0)
MCV: 87.1 fl (ref 78.0–100.0)
Monocytes Absolute: 0.4 10*3/uL (ref 0.1–1.0)
Monocytes Relative: 9.7 % (ref 3.0–12.0)
Neutro Abs: 3.1 10*3/uL (ref 1.4–7.7)
Neutrophils Relative %: 71.3 % (ref 43.0–77.0)
Platelets: 245 10*3/uL (ref 150.0–400.0)
RBC: 2.67 Mil/uL — ABNORMAL LOW (ref 3.87–5.11)
RDW: 16.9 % — ABNORMAL HIGH (ref 11.5–15.5)
WBC: 4.4 10*3/uL (ref 4.0–10.5)

## 2020-05-01 LAB — C-REACTIVE PROTEIN: CRP: <1 mg/dL (ref 0.5–20.0)

## 2020-05-01 LAB — TSH: TSH: 2.14 u[IU]/mL (ref 0.35–4.50)

## 2020-05-01 LAB — SEDIMENTATION RATE: Sed Rate: 47 mm/hr — ABNORMAL HIGH (ref 0–30)

## 2020-05-01 MED ORDER — ALPRAZOLAM 0.25 MG PO TABS: 0.1250 mg | ORAL_TABLET | Freq: Every day | ORAL | Status: DC | PRN

## 2020-05-01 MED ORDER — PAROXETINE HCL 20 MG PO TABS
60.0000 mg | ORAL_TABLET | Freq: Every morning | ORAL | Status: DC
  Administered 2020-05-02 – 2020-05-03 (×2): 60 mg via ORAL
  Filled 2020-05-01 (×3): qty 3
  Filled 2020-05-01: qty 2

## 2020-05-01 MED ORDER — ENOXAPARIN SODIUM 40 MG/0.4ML ~~LOC~~ SOLN
40.0000 mg | SUBCUTANEOUS | Status: DC
  Filled 2020-05-01: qty 0.4

## 2020-05-01 MED ORDER — POLYETHYLENE GLYCOL 3350 17 G PO PACK: 17.0000 g | PACK | Freq: Every day | ORAL | Status: DC | PRN

## 2020-05-01 MED ORDER — FOSFOMYCIN TROMETHAMINE 3 G PO PACK
3.0000 g | PACK | Freq: Once | ORAL | Status: AC
  Administered 2020-05-02: 3 g via ORAL
  Filled 2020-05-01: qty 3

## 2020-05-01 MED ORDER — GABAPENTIN 300 MG PO CAPS
300.0000 mg | ORAL_CAPSULE | Freq: Two times a day (BID) | ORAL | Status: DC
  Administered 2020-05-02 – 2020-05-03 (×4): 300 mg via ORAL
  Filled 2020-05-01 (×4): qty 1

## 2020-05-01 MED ORDER — SODIUM CHLORIDE 0.9 % IV SOLN
10.0000 mL/h | Freq: Once | INTRAVENOUS | Status: AC
  Administered 2020-05-02: 10 mL/h via INTRAVENOUS

## 2020-05-01 MED ORDER — ALBUTEROL SULFATE (2.5 MG/3ML) 0.083% IN NEBU: 2.5000 mg | INHALATION_SOLUTION | RESPIRATORY_TRACT | Status: DC | PRN

## 2020-05-01 MED ORDER — ACETAMINOPHEN 325 MG PO TABS
650.0000 mg | ORAL_TABLET | Freq: Four times a day (QID) | ORAL | Status: DC | PRN
  Administered 2020-05-02: 650 mg via ORAL
  Filled 2020-05-01: qty 2

## 2020-05-01 MED ORDER — IRBESARTAN 150 MG PO TABS
150.0000 mg | ORAL_TABLET | Freq: Every morning | ORAL | Status: DC
  Administered 2020-05-02 – 2020-05-03 (×2): 150 mg via ORAL
  Filled 2020-05-01 (×3): qty 1

## 2020-05-01 MED ORDER — ATORVASTATIN CALCIUM 40 MG PO TABS
40.0000 mg | ORAL_TABLET | Freq: Every day | ORAL | Status: DC
  Administered 2020-05-02 (×2): 40 mg via ORAL
  Filled 2020-05-01 (×2): qty 1

## 2020-05-01 MED ORDER — ACETAMINOPHEN 650 MG RE SUPP: 650.0000 mg | Freq: Four times a day (QID) | RECTAL | Status: DC | PRN

## 2020-05-01 MED ORDER — ASPIRIN EC 81 MG PO TBEC
81.0000 mg | DELAYED_RELEASE_TABLET | Freq: Every day | ORAL | Status: DC
  Administered 2020-05-02 (×2): 81 mg via ORAL
  Filled 2020-05-01 (×2): qty 1

## 2020-05-01 NOTE — ED Provider Notes (Signed)
[facility] EMERGENCY DEPARTMENT Provider Note   CSN: [id number] Arrival date & time: 05/01/20  1520     History Chief Complaint  Patient presents with  . Anemia  . Weakness  . Shortness of Breath    Autumn Conley is a 74 y.o. woman with history of metastatic thymoma (chemotherapy Alimta completed 09/2019), left breast Paget's disease, fibromyalgia, IBS, MVP, HLD, HTN, and migraines who presents to the ED from her PCP's office for further evaluation with complaints of weakness, dyspnea on exertion, and anemia to 7.6.  History was obtained from the patient, her daughter, and chart review.  The patient reports she has felt overall weak since she began chemotherapy for her thymoma back in December, however for the last month or so this generalized weakness has been accompanied by fatigue and dyspnea with exertion and speaking. She endorses palpitations in addition to "wooziness" or "feeling as if fading" when standing from a seated position, symptoms which resolve with sitting. Reports she fell three weeks ago when on the way to the bathroom, however at the time she was able to brace herself with her hand against a wall and so avoided hitting her head. She denies LOC with these episodes.   She denies recent fever/chills, current headache, dysuria, increased urgency hematuria, blood in her stools, paresthesias. States she has noticed her stool diameter is smaller, approximately twice the diameter of a pencil. Has a cough with small amount of white sputum production in the morning but no hemoptysis. Endorses chest discomfort which she states is along her R chest wall and has been ongoing since being diagnosed with her thymoma. She denies current abdominal pain, however reports a recent history of RLQ abdominal pain for which she was seen in the ED in January 2021. At that time, abdominal CT demonstrated inflammatory versus infectious changes to the cecum as well as concern for an  underlying mass in the R lateral wall of the cecum with regional lymphadenopathy. She was discharged with abx and referred to a gastroenterologist. Patient reports she saw a GI specialist and was told they did not want to perform a colonoscopy given her recent completion of chemotherapy. She has a remote history of abdominal surgeries for appendicitis as a child and hysterectomy  In 1980. Reports a recent history of UTI x 2 which she reports were treated with two different antibiotics, most recently 3-4 weeks ago.  Regarding her treatment for her thymoma, she reports she completed her chemotherapy this past December, and completed radiation about 6 weeks ago. States she had a scan 6 weeks ago which was "negative for new cells." Per chart review, her hemoglobin was 9.1 9 months ago, and the patient states she was told by her oncologist that anemia was an expected side effect of her chemotherapy treatment.     Past Medical History:  Diagnosis Date  . Anemia   . Anxiety   . Cancer ([place])   . Depression   . Dyspnea   . Fibromyalgia    "some; not chronic" (11/29/2014)  . GERD (gastroesophageal reflux disease)   . Glaucoma of both eyes   . Headache   . Hypertension   . IBS (irritable bowel syndrome)   . Mitral valve prolapse   . NASH (nonalcoholic steatohepatitis)   . Osteoarthritis   . PONV (postoperative nausea and vomiting)   . Stroke [facility])     Patient Active Problem List   Diagnosis Date Noted  . Anemia 05/01/2020  . Dyspnea 05/01/2020  .  Generalized weakness 05/01/2020  . UTI (urinary tract infection) 05/01/2020  . Symptomatic anemia 05/01/2020  . Glaucoma of both eyes   . NASH (nonalcoholic steatohepatitis)   . Mitral valve prolapse   . Osteoarthritis   . Thymoma -- managed by [facility] - extensive documentation in Care Everywhere 03/22/2019  . Mass of upper lobe of right lung 01/20/2019  . B12 deficiency 12/19/2018  . Stress incontinence in female 12/19/2018  . Dyslipidemia  12/14/2018  . OSA (obstructive sleep apnea) 12/07/2018  . History of breast cancer 12/07/2018  . Cognitive complaints 11/25/2018  . False positive stress test 01/29/2015  . Migraine without aura and without status migrainosus, not intractable 12/18/2014  . Small vessel disease, cerebrovascular 12/18/2014  . GERD (gastroesophageal reflux disease) 12/02/2014  . HTN (hypertension) 11/29/2014  . Anxiety 12/27/2013  . Depression 11/03/2012  . Elevated LFTs 11/03/2012  . Fibromyalgia 11/03/2012  . IBS (irritable bowel syndrome) 11/03/2012  . Lymphocytic colitis 11/03/2012  . Paget's disease of nipple ([place]) 11/03/2012  . Vitamin D deficiency 11/03/2012    Past Surgical History:  Procedure Laterality Date  . ABDOMINAL HYSTERECTOMY  1980  . APPENDECTOMY  1953  . BREAST BIOPSY Left   . BREAST LUMPECTOMY Left   . CATARACT EXTRACTION, BILATERAL  2018  . MASTECTOMY Left ~ 2009  . PARASTERNAL EXPLORATION Right 02/14/2019   Procedure: PARASTERNAL MEDIAL EXPLORATION WITH BIOPIES.;  Surgeon: [name], MD;  Location: [facility] OR;  Service: Thoracic;  Laterality: Right;     OB History   No obstetric history on file.     Family History  Problem Relation Age of Onset  . Lung cancer Mother 4  . Prostate cancer Brother 11    Social History   Tobacco Use  . Smoking status: Never Smoker  . Smokeless tobacco: Never Used  Substance Use Topics  . Alcohol use: No  . Drug use: No    Home Medications Prior to Admission medications   Medication Sig Start Date End Date Taking? Authorizing Provider  ALPRAZolam Duanne Moron) 0.25 MG tablet Take 0.5 tablets (0.125 mg total) by mouth daily as needed for anxiety. 02/27/20  Yes [name], PA  Ascorbic Acid (VITAMIN C) 1000 MG tablet Take 1,000 mg by mouth daily.   Yes [provider]  aspirin EC 81 MG tablet Take 81 mg by mouth at bedtime.    Yes [provider]  atorvastatin (LIPITOR) 40 MG tablet TAKE 1 TABLET BY MOUTH AT   BEDTIME Patient taking differently: Take 40 mg by mouth at bedtime.  11/29/19  Yes Leamon Arnt, MD  butalbital-acetaminophen-caffeine [facility]) [phone] MG tablet Take 1-2 tablets by mouth every 6 (six) hours as needed for headache. 03/05/20 03/05/21 Yes [name], PA  Cholecalciferol (VITAMIN D-3 PO) Take 1 capsule by mouth daily.   Yes [provider]  cyanocobalamin (,VITAMIN B-12,) 1000 MCG/ML injection Inject 1 mL (1,000 mcg total) into the muscle every 30 (thirty) days. 03/01/20  Yes [name], PA  gabapentin (NEURONTIN) 300 MG capsule Take 300 mg by mouth 2 (two) times daily.  10/14/18  Yes [provider]  irbesartan (AVAPRO) 150 MG tablet TAKE 1 TABLET BY MOUTH  DAILY Patient taking differently: Take 150 mg by mouth in the morning.  12/26/19  Yes Leamon Arnt, MD  ondansetron (ZOFRAN) 8 MG tablet Take 8 mg by mouth every 8 (eight) hours as needed for nausea or vomiting.   Yes [provider]  PARoxetine (PAXIL) 20 MG tablet  TAKE 3 TABLETS BY MOUTH IN  THE MORNING Patient taking differently: Take 60 mg by mouth in the morning.  12/26/19  Yes Leamon Arnt, MD  polyethylene glycol powder (GLYCOLAX/MIRALAX) 17 GM/SCOOP powder Take 17 g by mouth daily as needed for mild constipation.   Yes [provider]  SUMAtriptan (IMITREX) 100 MG tablet Take 1 tablet (100 mg total) by mouth 2 (two) times daily as needed. At least 2 hrs between doses as needed bid Patient taking differently: Take 100 mg by mouth 2 (two) times daily as needed for migraine or headache (and may repeat once in 2 hours, if no relief).  02/27/20  Yes [name], PA  valACYclovir (VALTREX) 1000 MG tablet Take two tablets ( total 2000 mg) by mouth q12h x 1 day; Start: ASAP after symptom onset Patient taking differently: Take 2,000 mg by mouth See admin instructions. Take 2,000 mg by mouth immediately at onset of fever blisters every 12 hours for 1 day, as directed 03/05/20  Yes  Morene Rankins, [place], PA  vitamin E (VITAMIN E) 180 MG (400 UNITS) capsule Take 400 Units by mouth daily.   Yes [provider]  cycloSPORINE (RESTASIS) 0.05 % ophthalmic emulsion Place 1 drop into both eyes 2 (two) times daily. Patient not taking: Reported on 05/01/2020    [provider]  oxyCODONE (OXY IR/ROXICODONE) 5 MG immediate release tablet Take 1 tablet (5 mg total) by mouth every 6 (six) hours as needed for severe pain. Patient not taking: Reported on 05/01/2020 02/16/19   Gold, Patrick Jupiter E, PA-C  SYRINGE-NEEDLE, DISP, 3 ML 25G X 1" 3 ML MISC Use to inject Vit B12 once a month. 03/01/20   [name], PA    Allergies    Latex  Review of Systems   Review of Systems  Constitutional: Positive for fatigue. Negative for chills, diaphoresis and fever.  HENT: Negative for congestion, rhinorrhea and sore throat.   Respiratory: Positive for shortness of breath. Negative for chest tightness.   Cardiovascular: Positive for chest pain.  Gastrointestinal: Negative for abdominal pain, blood in stool, constipation, diarrhea, nausea and vomiting.  Genitourinary: Negative for difficulty urinating, dysuria, hematuria and urgency.  Skin: Negative for rash.  Neurological: Positive for weakness and light-headedness. Negative for dizziness, numbness and headaches.    Physical Exam Updated Vital Signs BP (!) 115/56 (BP Location: Right Arm)   Pulse 93   Temp 98.7 F (37.1 C)   Resp 14   SpO2 97%   Physical Exam Constitutional:      General: She is not in acute distress.    Appearance: She is well-developed. She is not ill-appearing or diaphoretic.  HENT:     Head: Normocephalic and atraumatic.     Mouth/Throat:     Mouth: Mucous membranes are moist.     Pharynx: Oropharynx is clear.  Eyes:     Extraocular Movements: Extraocular movements intact.     Pupils: Pupils are equal, round, and reactive to light.     Comments: Conjunctival pallor   Cardiovascular:     Rate and  Rhythm: Normal rate and regular rhythm.     Heart sounds: S1 normal and S2 normal. Murmur heard.  Systolic murmur is present.   Pulmonary:     Effort: Pulmonary effort is normal.     Breath sounds: Normal breath sounds.  Abdominal:     Palpations: Abdomen is soft. There is no mass.     Tenderness: There is no abdominal tenderness. There is no  guarding or rebound.  Genitourinary:    Rectum: No external hemorrhoid.     Comments: Rectal exam: stool is brown, no gross blood observed. Guaiac result pending. Musculoskeletal:     Right lower leg: No edema.     Left lower leg: No edema.  Skin:    General: Skin is warm and dry.  Neurological:     Mental Status: She is alert and oriented to person, place, and time. Mental status is at baseline.     ED Results / Procedures / Treatments   Labs (all labs ordered are listed, but only abnormal results are displayed) Labs Reviewed  COMPREHENSIVE METABOLIC PANEL - Abnormal; Notable for the following components:      Result Value   Glucose, Bld 184 (*)    AST 57 (*)    ALT 50 (*)    Total Bilirubin 0.2 (*)    GFR calc non Af Amer 55 (*)    All other components within normal limits  CBC - Abnormal; Notable for the following components:   RBC 2.57 (*)    Hemoglobin 7.1 (*)    HCT 23.3 (*)    RDW 15.7 (*)    All other components within normal limits  SARS CORONAVIRUS 2 BY RT PCR (HOSPITAL ORDER, [place] LAB)  POC OCCULT BLOOD, ED  TYPE AND SCREEN  PREPARE RBC (CROSSMATCH)    EKG None  Radiology DG Chest 2 View  Result Date: 05/01/2020 CLINICAL DATA:  Weakness, short of breath, history of left breast cancer EXAM: CHEST - 2 VIEW COMPARISON:  02/24/2019 FINDINGS: Frontal and lateral views of the chest demonstrate a stable cardiac silhouette. No airspace disease, effusion, or pneumothorax. Stable postsurgical changes from left mastectomy. No acute bony abnormalities. IMPRESSION: 1. No acute intrathoracic process.  2. The right hilar/anterior mediastinal mass seen on prior examination is not seen today. Electronically Signed   By: Randa Ngo M.D.   On: 05/01/2020 16:27    Procedures Procedures (including critical care time)  Medications Ordered in ED Medications  0.9 %  sodium chloride infusion (has no administration in time range)    ED Course  I have reviewed the triage vital signs and the nursing notes.  Pertinent labs & imaging results that were available during my care of the patient were reviewed by me and considered in my medical decision making (see chart for details).    MDM Rules/Calculators/A&P                          Jaiona Simien is a 74 y.o. woman with history of metastatic thymoma (chemotherapy Alimta completed 09/2019), left breast Paget's disease, fibromyalgia, IBS, MVP, HLD, HTN, and migraines who presents to the ED from her PCP's office with symptomatic anemia.  Patient is well- and non-toxic appearing. Afebrile and tachycardic but otherwise hemodynamically stable. Hemoglobin 7.1 with MCV of 90.7 on repeat CBC. No obvious signs or symptoms of bleeding on exam. Fecal occult blood test pending. Will repeat CT abdomen pelvis given presence of mass from scan in Jan 2021, though patient currently without abdominal pain. Low suspicion for anemia secondary to chemotherapy given its completion over 6 months ago. Will prepare RBC for transfusion and discuss case with hospitalists.  - Discussed with hospitalists who will observe the patient and continue work-up.  Final Clinical Impression(s) / ED Diagnoses Final diagnoses:  None    Rx / DC Orders ED Discharge Orders  None       Alexandria Lodge, MD 05/01/20 [place], DO 05/01/20 2250

## 2020-05-01 NOTE — Telephone Encounter (Signed)
Critical lab  Hemoglobin 7.6 HCT 23.2

## 2020-05-01 NOTE — Addendum Note (Signed)
Addended bySerita Sheller on: 05/01/2020 01:16 PM   Modules accepted: Orders

## 2020-05-01 NOTE — H&P (Addendum)
History and Physical    Glessie Eustice FGH:[id number] DOB: [birth year] DOA: 05/01/2020  PCP: [name], PA   Patient coming from:    Home  Chief Complaint: generalized weakness, SOB with excertion, lightheaded when stands up. Low blood count per PCP  HPI: Autumn Conley is a 74 y.o. female with medical history significant for thyoma being followed by Oncology, Hx of Pagets disease of breast, HTN, HLD, anxiety, IBS, NASH, fibromyalgia who presents for evaluation of generalized weakness, dizziness, shortness of breath with exertion.  Patient saw her PCP this morning and was called in the afternoon and told that her hemoglobin and hematocrit levels were decreased and she needed to come to the emergency room for evaluation.  She reports for the last few weeks she has had a low energy level and when she stands up she becomes dizzy and "woozy".  She has not had any loss of consciousness.  Reports she has fallen a few times over the last few weeks secondary to being weak and feeling lightheaded but did not have any injury.  She states she has not had any abdominal pain, abdominal cramps, nausea, vomiting, diarrhea.  Denies having any black tarry stool.  She reports she has never had GI bleeding or ulcers.  She denies any fever, chills, cough, upper respiratory symptoms.  She reports she does have urinary frequency and some mild dysuria and has had to UTIs in the last few months.  She states that her PCP was sending her to [facility] this morning to have a CT of her chest to rule out pulmonary embolism to make sure this was not a cause of her shortness of breath.  CT was not performed as the facility did not have any labs back on the patient and was being rescheduled for tomorrow. She is being followed by oncology at [facility] for her thymoma.  She underwent radiation and chemotherapy with the last treatment in December 2020.  She is still followed by oncology.  It is noted that she had a CT scan last year  which showed a possible mass in part of her colon saw GI but no biopsy or colonoscopy was done at that time.  ED Course: In the emergency room patient is hemodynamically stable.  She is Hemoccult negative on testing.  She has hemoglobin of 7.2 with hematocrit of 23.3.  She denies any chest pain or palpitations.  Urinalysis is positive for UTI.  CT of her abdomen and pelvis to further evaluate mass from previous CT scan to make sure is not growing as ordered.  Have also ordered CT angiography of her chest to rule out PE her symptoms and history of cancer  Review of Systems:  General: Denies fever, chills, weight loss, night sweats.  Reports dizziness when standing up.  Denies change in appetite HENT: Denies head trauma, headache, denies change in hearing, tinnitus.  Denies nasal congestion or bleeding.  Denies sore throat, sores in mouth.  Denies difficulty swallowing Eyes: Denies blurry vision, pain in eye, drainage.  Denies discoloration of eyes. Neck: Denies pain.  Denies swelling.  Denies pain with movement. Cardiovascular: Denies chest pain, palpitations.  Denies edema.  Denies orthopnea Respiratory: Reports shortness of breath lacerated by exertion.  No cough.  Denies wheezing.  Denies sputum production Gastrointestinal: Denies abdominal pain, swelling.  Denies nausea, vomiting, diarrhea.  Denies melena.  Denies hematemesis. Musculoskeletal: Denies limitation of movement.  Denies deformity or swelling.  Denies pain.  Denies arthralgias or myalgias. Genitourinary: Denies  pelvic pain.  Reports dysuria and urinary frequency.  Denies urinary hesitancy.   Skin: Denies rash.  Denies petechiae, purpura, ecchymosis. Neurological: Denies headache.  Denies syncope.  Denies seizure activity.  Denies weakness or paresthesia.  Denies slurred speech, drooping face.  Denies visual change. Psychiatric: Denies depression, anxiety.  Denies suicidal thoughts or ideation.  Denies hallucinations.  Past Medical  History:  Diagnosis Date  . Anemia   . Anxiety   . Cancer ([place])   . Depression   . Dyspnea   . Fibromyalgia    "some; not chronic" (11/29/2014)  . GERD (gastroesophageal reflux disease)   . Glaucoma of both eyes   . Headache   . Hypertension   . IBS (irritable bowel syndrome)   . Mitral valve prolapse   . NASH (nonalcoholic steatohepatitis)   . Osteoarthritis   . PONV (postoperative nausea and vomiting)   . Stroke [facility])     Past Surgical History:  Procedure Laterality Date  . ABDOMINAL HYSTERECTOMY  1980  . APPENDECTOMY  1953  . BREAST BIOPSY Left   . BREAST LUMPECTOMY Left   . CATARACT EXTRACTION, BILATERAL  2018  . MASTECTOMY Left ~ 2009  . PARASTERNAL EXPLORATION Right 02/14/2019   Procedure: PARASTERNAL MEDIAL EXPLORATION WITH BIOPIES.;  Surgeon: [name], MD;  Location: [place];  Service: Thoracic;  Laterality: Right;    Social History  reports that she has never smoked. She has never used smokeless tobacco. She reports that she does not drink alcohol and does not use drugs.  Allergies  Allergen Reactions  . Latex Rash and Other (See Comments)    Blisters, also    Family History  Problem Relation Age of Onset  . Lung cancer Mother 42  . Prostate cancer Brother 78     Prior to Admission medications   Medication Sig Start Date End Date Taking? Authorizing Provider  ALPRAZolam Duanne Moron) 0.25 MG tablet Take 0.5 tablets (0.125 mg total) by mouth daily as needed for anxiety. 02/27/20  Yes [name], PA  Ascorbic Acid (VITAMIN C) 1000 MG tablet Take 1,000 mg by mouth daily.   Yes [provider]  aspirin EC 81 MG tablet Take 81 mg by mouth at bedtime.    Yes [provider]  atorvastatin (LIPITOR) 40 MG tablet TAKE 1 TABLET BY MOUTH AT  BEDTIME Patient taking differently: Take 40 mg by mouth at bedtime.  11/29/19  Yes Leamon Arnt, MD  butalbital-acetaminophen-caffeine [facility]) [id number] MG tablet Take 1-2 tablets by mouth every 6  (six) hours as needed for headache. 03/05/20 03/05/21 Yes [name], PA  Cholecalciferol (VITAMIN D-3 PO) Take 1 capsule by mouth daily.   Yes [provider]  cyanocobalamin (,VITAMIN B-12,) 1000 MCG/ML injection Inject 1 mL (1,000 mcg total) into the muscle every 30 (thirty) days. 03/01/20  Yes [name], PA  gabapentin (NEURONTIN) 300 MG capsule Take 300 mg by mouth 2 (two) times daily.  10/14/18  Yes [provider]  irbesartan (AVAPRO) 150 MG tablet TAKE 1 TABLET BY MOUTH  DAILY Patient taking differently: Take 150 mg by mouth in the morning.  12/26/19  Yes Leamon Arnt, MD  ondansetron (ZOFRAN) 8 MG tablet Take 8 mg by mouth every 8 (eight) hours as needed for nausea or vomiting.   Yes [provider]  PARoxetine (PAXIL) 20 MG tablet TAKE 3 TABLETS BY MOUTH IN  THE MORNING Patient taking differently: Take 60 mg by mouth in the morning.  12/26/19  Yes Leamon Arnt, MD  polyethylene glycol powder (GLYCOLAX/MIRALAX) 17 GM/SCOOP powder Take 17 g by mouth daily as needed for mild constipation.   Yes [provider]  SUMAtriptan (IMITREX) 100 MG tablet Take 1 tablet (100 mg total) by mouth 2 (two) times daily as needed. At least 2 hrs between doses as needed bid Patient taking differently: Take 100 mg by mouth 2 (two) times daily as needed for migraine or headache (and may repeat once in 2 hours, if no relief).  02/27/20  Yes [name], PA  valACYclovir (VALTREX) 1000 MG tablet Take two tablets ( total 2000 mg) by mouth q12h x 1 day; Start: ASAP after symptom onset Patient taking differently: Take 2,000 mg by mouth See admin instructions. Take 2,000 mg by mouth immediately at onset of fever blisters every 12 hours for 1 day, as directed 03/05/20  Yes Morene Rankins, [place], PA  vitamin E (VITAMIN E) 180 MG (400 UNITS) capsule Take 400 Units by mouth daily.   Yes [provider]  cycloSPORINE (RESTASIS) 0.05 % ophthalmic emulsion Place 1 drop  into both eyes 2 (two) times daily. Patient not taking: Reported on 05/01/2020    [provider]  oxyCODONE (OXY IR/ROXICODONE) 5 MG immediate release tablet Take 1 tablet (5 mg total) by mouth every 6 (six) hours as needed for severe pain. Patient not taking: Reported on 05/01/2020 02/16/19   Gold, Patrick Jupiter E, PA-C  SYRINGE-NEEDLE, DISP, 3 ML 25G X 1" 3 ML MISC Use to inject Vit B12 once a month. 03/01/20   [name], PA    Physical Exam: Vitals:   05/01/20 1537 05/01/20 1822  BP: 104/63 103/65  Pulse: (!) 106 (!) 101  Resp: 16 16  Temp: 98.5 F (36.9 C)   TempSrc: Oral   SpO2: 99% 100%    Constitutional: NAD, calm, comfortable Vitals:   05/01/20 1537 05/01/20 1822  BP: 104/63 103/65  Pulse: (!) 106 (!) 101  Resp: 16 16  Temp: 98.5 F (36.9 C)   TempSrc: Oral   SpO2: 99% 100%   General: WDWN, Alert and oriented x3.  Eyes: EOMI, PERRL, conjunctivae pale pink color.  Sclera nonicteric HENT:  [place]/AT, external ears normal.  Nares patent without epistasis.  Mucous membranes are moist. Posterior pharynx clear of any exudate or lesions. Normal dentition.  Neck: Soft, normal range of motion, supple, no masses, no thyromegaly.  Trachea midline Respiratory: clear to auscultation bilaterally, no wheezing, no crackles. Normal respiratory effort. No accessory muscle use.  Cardiovascular: Regular rate and rhythm, 3/6 systolic murmurs. No rubs / gallops. No extremity edema. 2+ pedal pulses.  Abdomen: Soft, no tenderness, nondistended, no rebound or guarding.  No masses palpated. No hepatosplenomegaly. Bowel sounds normoactive Musculoskeletal: FROM. no clubbing / cyanosis. No joint deformity upper and lower extremities. Normal muscle tone.  Skin: Warm, dry, intact no rashes, lesions, ulcers. No induration Neurologic: CN 2-12 grossly intact.  Normal speech.  Sensation intact, patella DTR +1 bilaterally. Strength 5/5 in all extremities.   Psychiatric: Normal judgment and insight.   Normal mood.    Labs on Admission: I have personally reviewed following labs and imaging studies  CBC: Recent Labs  Lab 05/01/20 1405 05/01/20 1551  WBC 4.4 4.2  NEUTROABS 3.1  --   HGB 7.6 Repeated and verified X2.* 7.1*  HCT 23.2 Repeated and verified X2.* 23.3*  MCV 87.1 90.7  PLT 245.0 188    Basic Metabolic Panel: Recent Labs  Lab 05/01/20 1405 05/01/20 1551  NA 136 137  K 4.7 4.1  CL 100 100  CO2 27 25  GLUCOSE 105* 184*  BUN 19 16  CREATININE 0.84 1.00  CALCIUM 9.7 9.3    GFR: Estimated Creatinine Clearance: 48.2 mL/min (by C-G formula based on SCr of 1 mg/dL).  Liver Function Tests: Recent Labs  Lab 05/01/20 1405 05/01/20 1551  AST 45* 57*  ALT 45* 50*  ALKPHOS 116 106  BILITOT 0.4 0.2*  PROT 7.1 6.8  ALBUMIN 4.6 3.8    Urine analysis:    Component Value Date/Time   COLORURINE YELLOW 05/01/2020 1405   APPEARANCEUR Cloudy (A) 05/01/2020 1405   LABSPEC 1.010 05/01/2020 1405   PHURINE 6.5 05/01/2020 1405   GLUCOSEU NEGATIVE 05/01/2020 1405   HGBUR NEGATIVE 05/01/2020 1405   BILIRUBINUR NEGATIVE 05/01/2020 1405   BILIRUBINUR 1+ 03/05/2020 1410   KETONESUR NEGATIVE 05/01/2020 1405   PROTEINUR Positive (A) 03/05/2020 1410   PROTEINUR NEGATIVE 11/08/2019 0028   UROBILINOGEN 1.0 05/01/2020 1405   NITRITE POSITIVE (A) 05/01/2020 1405   LEUKOCYTESUR LARGE (A) 05/01/2020 1405    Radiological Exams on Admission: DG Chest 2 View  Result Date: 05/01/2020 CLINICAL DATA:  Weakness, short of breath, history of left breast cancer EXAM: CHEST - 2 VIEW COMPARISON:  02/24/2019 FINDINGS: Frontal and lateral views of the chest demonstrate a stable cardiac silhouette. No airspace disease, effusion, or pneumothorax. Stable postsurgical changes from left mastectomy. No acute bony abnormalities. IMPRESSION: 1. No acute intrathoracic process. 2. The right hilar/anterior mediastinal mass seen on prior examination is not seen today. Electronically Signed   By: Randa Ngo M.D.   On: 05/01/2020 16:27    EKG: Independently reviewed.  EKG shows sinus tachycardia with a heart rate of 100-105.  No acute ST elevation or depression.  QTc 466  Assessment/Plan Principal Problem:   Symptomatic anemia Ms. Yarrow will be placed on medical surgical floor for observation overnight. 1 unit of packed red blood cells to be transfused. Check CBC in morning Patient has occult negative in the emergency room    Dyspnea Patient with dyspnea which is worsened with exertion.  Could be secondary to her anemia and will monitor with correction of anemia with packed red blood cell transfusion. We will also check CT angiography of chest to rule out PE as etiology of dyspnea as patient does have increased risk with history of cancer and undergoing radiation and chemotherapy recently.    HTN (hypertension) Blood pressure stable.  Continue home medication of Avapro.  Monitor blood pressure.    Dyslipidemia Continue home dose of Lipitor.    Generalized weakness Consult physical therapy for evaluation.  Is contributing to the generalized weakness and should improve with improvement of hemoglobin level    UTI (urinary tract infection) Treat with fosfomycin one-time.     History of breast cancer   Thymoma -- managed by [facility] - extensive documentation in Care Everywhere       DVT prophylaxis: Padua score elevated.  Lovenox for DVT prophylaxis.  Patient has no signs of GI bleeding and is Hemoccult negative. If PE is identified on CT angiography will convert to therapeutic anticoagulation Code Status:   Full code Family Communication:  Diagnosis plan discussed with patient and her daughter who is at bedside.  They both verbalizes understanding and agrees with plan.  Further recommendations to follow as clinical indicated  Disposition Plan:   Patient is from:  Home  Anticipated DC to:  Home  Anticipated DC date:  Anticipate  less than 2 midnight stay in the  hospital  Anticipated DC barriers: No barriers to discharge identified at this time   Admission status:  Observation  Severity of Illness: The appropriate patient status for this patient is OBSERVATION. Observation status is judged to be reasonable and necessary in order to provide the required intensity of service to ensure the patient's safety. The patient's presenting symptoms, physical exam findings, and initial radiographic and laboratory data in the context of their medical condition is felt to place them at decreased risk for further clinical deterioration. Furthermore, it is anticipated that the patient will be medically stable for discharge from the hospital within 2 midnights of admission. The following factors support the patient status of observation.   " The patient's presenting symptoms include generalized weakness, shortness of breath with exertion.  Dysuria " The physical exam findings include generalized weakness with pale conjunctiva. " The initial radiographic and laboratory data are hemoglobin 7.2 hematocrit 23.3.  Positive UA for UTI.      Yevonne Aline Nathanuel Cabreja MD [facility]  How to contact the [facility] Attending or Consulting provider [place] or covering provider during after hours [place], for this patient?   1. Check the care team in [facility] and look for a) attending/consulting [facility] provider listed and b) the [facility] team listed 2. Log into [email or web address] and use [place]'s universal password to access. If you do not have the password, please contact the hospital operator. 3. Locate the [facility] provider you are looking for under [facility] and page to a number that you can be directly reached. 4. If you still have difficulty reaching the provider, please page the [facility] (Director on Call) for the Hospitalists listed on amion for assistance.  05/01/2020, 9:44 PM

## 2020-05-01 NOTE — Progress Notes (Addendum)
Autumn Conley is a 74 y.o. female is here to discuss: dizziness and heart problems  I acted as a Education administrator for [employer], PA-C [place], [place]  History of Present Illness:   Chief Complaint  Patient presents with   Dizziness   Palpitations    HPI   Heart palpitations & dizziness Has been going on for at least a month. She c/o of dizzy spells, SOB, and she feels like her heart is racing and jumping out of her chest. After doing even the smallest amount of activity in her house she is getting very fatigued and feeling woozy. Symptoms regarding heart palpitations and dizziness only occur while standing up or right after standing. She is having these episodes almost daily.  She did see a cardiologist, Dr. Daneen Schick in April 2016 and was told to f/u as needed. She had a normal echo and stress test in 2018 during hospitalization for chest pain and headache.   She fell 3 weeks ago in her daughters home but did not trip over anything, felt woozy and all of a sudden she fell. Denies: vision changes, nausea, slurred speech.  She has been cleared by her oncologist, for hx of thymoma.  Her family has been "hovering" to make sure she is doing well. She is getting edgy and agitated more easily due to this. She is overall having good sleep.   Has a neurologist that she sees annually for Dr. Sima Matas Jule Ser) -- neurologist.  She states that she has a pulse ox at home and it has been ranging from 88-93%. She states that when she was hospitalized for her thymoma she had an issue with her oxygen level. She had rapid response called in as well during this time.  Wt Readings from Last 4 Encounters:  05/01/20 167 lb 6.4 oz (75.9 kg)  03/05/20 166 lb 3.2 oz (75.4 kg)  02/27/20 171 lb 9.6 oz (77.8 kg)  08/11/19 167 lb 9.6 oz (76 kg)   BP Readings from Last 3 Encounters:  05/01/20 118/62  03/05/20 132/74  02/27/20 (!) 142/84   Her daughter also reports history of "unspecified  autoimmune disorder." Was seeing rheumatologist several years ago in [place].  Health Maintenance Due  Topic Date Due   COLONOSCOPY  Never done   PNA vac Low Risk Adult (2 of 2 - PPSV23) 08/15/2016    Past Medical History:  Diagnosis Date   Anxiety    Depression    Fibromyalgia    "some; not chronic" (11/29/2014)   GERD (gastroesophageal reflux disease)    Glaucoma of both eyes    IBS (irritable bowel syndrome)    Mitral valve prolapse    NASH (nonalcoholic steatohepatitis)    Osteoarthritis    PONV (postoperative nausea and vomiting)      Social History   Tobacco Use   Smoking status: Never Smoker   Smokeless tobacco: Never Used  Substance Use Topics   Alcohol use: No   Drug use: No    Past Surgical History:  Procedure Laterality Date   ABDOMINAL HYSTERECTOMY  1980   APPENDECTOMY  1953   BREAST BIOPSY Left    BREAST LUMPECTOMY Left    CATARACT EXTRACTION, BILATERAL  2018   MASTECTOMY Left ~ 2009   PARASTERNAL EXPLORATION Right 02/14/2019   Procedure: PARASTERNAL MEDIAL EXPLORATION WITH BIOPIES.;  Surgeon: [name], MD;  Location: [facility] OR;  Service: Thoracic;  Laterality: Right;    Family History  Problem Relation Age of Onset  Lung cancer Mother 59   Prostate cancer Brother 72    PMHx, SurgHx, SocialHx, FamHx, Medications, and Allergies were reviewed in the Visit Navigator and updated as appropriate.   Patient Active Problem List   Diagnosis Date Noted   Glaucoma of both eyes    NASH (nonalcoholic steatohepatitis)    Mitral valve prolapse    Osteoarthritis    Thymoma -- managed by [facility] - extensive documentation in Care Everywhere 03/22/2019   Mass of upper lobe of right lung 01/20/2019   B12 deficiency 12/19/2018   Stress incontinence in female 12/19/2018   Dyslipidemia 12/14/2018   OSA (obstructive sleep apnea) 12/07/2018   History of breast cancer 12/07/2018   Cognitive complaints 11/25/2018   False  positive stress test 01/29/2015   Migraine without aura and without status migrainosus, not intractable 12/18/2014   Small vessel disease, cerebrovascular 12/18/2014   GERD (gastroesophageal reflux disease) 12/02/2014   HTN (hypertension) 11/29/2014   Anxiety 12/27/2013   Depression 11/03/2012   Elevated LFTs 11/03/2012   Fibromyalgia 11/03/2012   IBS (irritable bowel syndrome) 11/03/2012   Lymphocytic colitis 11/03/2012   Paget's disease of nipple ([place]) 11/03/2012   Vitamin D deficiency 11/03/2012    Social History   Tobacco Use   Smoking status: Never Smoker   Smokeless tobacco: Never Used  Substance Use Topics   Alcohol use: No   Drug use: No    Current Medications and Allergies:    Current Outpatient Medications:    ALPRAZolam (XANAX) 0.25 MG tablet, Take 0.5 tablets (0.125 mg total) by mouth daily as needed for anxiety., Disp: 30 tablet, Rfl: 0   Ascorbic Acid (VITAMIN C) 1000 MG tablet, Take 1,000 mg by mouth daily., Disp: , Rfl:    aspirin EC 81 MG tablet, Take 81 mg by mouth daily., Disp: , Rfl:    atorvastatin (LIPITOR) 40 MG tablet, TAKE 1 TABLET BY MOUTH AT  BEDTIME, Disp: 90 tablet, Rfl: 3   butalbital-acetaminophen-caffeine (FIORICET) 50-325-40 MG tablet, Take 1-2 tablets by mouth every 6 (six) hours as needed for headache., Disp: 20 tablet, Rfl: 0   cyanocobalamin (,VITAMIN B-12,) 1000 MCG/ML injection, Inject 1 mL (1,000 mcg total) into the muscle every 30 (thirty) days., Disp: 3 mL, Rfl: 2   gabapentin (NEURONTIN) 300 MG capsule, Take 300 mg by mouth 2 (two) times daily. , Disp: , Rfl:    irbesartan (AVAPRO) 150 MG tablet, TAKE 1 TABLET BY MOUTH  DAILY, Disp: 90 tablet, Rfl: 3   oxyCODONE (OXY IR/ROXICODONE) 5 MG immediate release tablet, Take 1 tablet (5 mg total) by mouth every 6 (six) hours as needed for severe pain., Disp: 10 tablet, Rfl: 0   PARoxetine (PAXIL) 20 MG tablet, TAKE 3 TABLETS BY MOUTH IN  THE MORNING, Disp: 270 tablet,  Rfl: 3   Polyethylene Glycol 3350 (MIRALAX PO), Take by mouth as needed., Disp: , Rfl:    SUMAtriptan (IMITREX) 100 MG tablet, Take 1 tablet (100 mg total) by mouth 2 (two) times daily as needed. At least 2 hrs between doses as needed bid, Disp: 10 tablet, Rfl: 3   SYRINGE-NEEDLE, DISP, 3 ML 25G X 1" 3 ML MISC, Use to inject Vit B12 once a month., Disp: 12 each, Rfl: 1   valACYclovir (VALTREX) 1000 MG tablet, Take two tablets ( total 2000 mg) by mouth q12h x 1 day; Start: ASAP after symptom onset, Disp: 6 tablet, Rfl: 2   cycloSPORINE (RESTASIS) 0.05 % ophthalmic emulsion, Place 1 drop into both eyes  2 (two) times daily. (Patient not taking: Reported on 05/01/2020), Disp: , Rfl:   No Known Allergies  Review of Systems   ROS  Negative unless otherwise specified per HPI.  Vitals:   Vitals:   05/01/20 1100 05/01/20 1202  BP: 118/62   Pulse: 96   Temp: (!) 97.3 F (36.3 C)   TempSrc: Temporal   SpO2: 90% (!) 71%  Weight: 167 lb 6.4 oz (75.9 kg)   Height: 5' 3"  (1.6 m)      Body mass index is 29.65 kg/m.  Orthostatic VS for the past 72 hrs (Last 3 readings):  Orthostatic BP Patient Position BP Location Cuff Size Orthostatic Pulse  05/01/20 1202 111/71 Standing Right Arm Normal 93  05/01/20 1201 112/71 Sitting Right Arm Normal 92  05/01/20 1200 105/69 Supine Right Arm Normal 85   Pulse ox when walking was 72%.  Physical Exam:    Physical Exam Vitals and nursing note reviewed.  Constitutional:      General: She is not in acute distress.    Appearance: She is well-developed. She is not ill-appearing or toxic-appearing.  Cardiovascular:     Rate and Rhythm: Normal rate and regular rhythm.     Pulses: Normal pulses.     Heart sounds: Normal heart sounds, S1 normal and S2 normal.     Comments: No LE edema Pulmonary:     Effort: Pulmonary effort is normal.     Breath sounds: Normal breath sounds.  Skin:    General: Skin is warm and dry.  Neurological:     General: No  focal deficit present.     Mental Status: She is alert.     GCS: GCS eye subscore is 4. GCS verbal subscore is 5. GCS motor subscore is 6.     Cranial Nerves: Cranial nerves are intact.     Sensory: Sensation is intact.     Motor: Motor function is intact.     Coordination: Coordination is intact.  Psychiatric:        Speech: Speech normal.        Behavior: Behavior normal. Behavior is cooperative.      Assessment and Plan:    Autumn Conley was seen today for dizziness and palpitations.  Diagnoses and all orders for this visit:  Dizziness; Fatigue, unspecified type; Shortness of breath EKG tracing is personally reviewed.  EKG notes NSR.  No acute changes.   Orthostatics negative. Ambulatory oxygen level dipped down to 71%. Will obtain stat CTA to r/o PE or other possible cause of symptoms. Will refer to cardiology for further work-up as well, she has seen Dr. Daneen Schick in the past and would like to see him again. Will also update labs and urine today. Low threshold to go to the ER for further evaluation in the interim. -     EKG 12-Lead -     CBC with Differential/Platelet -     Comprehensive metabolic panel -     TSH -     Urinalysis, Routine w reflex microscopic -     Ambulatory referral to Cardiology       -     CT ANGIO CHEST PE W OR WO CONTRAST; Future  Hx of autoimmune disorder Update labs per patient and daughter's request. Will refer to rheumatology for further work-up if indicated. -     ANA -     Sedimentation rate -     C-reactive protein -     Rheumatoid factor  Reviewed expectations re: course of current medical issues.  Discussed self-management of symptoms.  Outlined signs and symptoms indicating need for more acute intervention.  Patient verbalized understanding and all questions were answered.  See orders for this visit as documented in the electronic medical record.  Patient received an After Visit Summary.  CMA or LPN served as scribe during this  visit. History, Physical, and Plan performed by medical provider. The above documentation has been reviewed and is accurate and complete.  I spent >45 minutes with this patient, greater than 50% was face-to-face time counseling regarding the above diagnoses.  [name], PA-C [place], [place] 05/01/2020  Follow-up: No follow-ups on file.

## 2020-05-01 NOTE — ED Triage Notes (Signed)
Pt sent by PCP for further evaluation of generalized weakness, DOE and anemia. Pts hgb 7.6 earlier today. Pt does report darker stools but unsure if its blood. Denies any abd pain. Cancer pt in remission. Pt a.o, resp e.u

## 2020-05-01 NOTE — ED Notes (Signed)
Pt was consented by MD Tyrone Nine, pt has signed consent 05/01/20 at 2355

## 2020-05-01 NOTE — Telephone Encounter (Signed)
Please send patient to ER.

## 2020-05-01 NOTE — Telephone Encounter (Signed)
Spoke to pt and daughter [name]. Told them per [name] need to go to the ER due to critical labs. Hgb is 7.6 & Hct is 23.2. [name] and pt verbalized understanding and asked which ER they should go to [facility] or [facility]? Told them you can go to either one we will be able to see notes. Both verbalized understanding.

## 2020-05-01 NOTE — Patient Instructions (Signed)
It was great to see you!  You will be contacted about your referral to cardiology.  Please proceed with the CT scan. I will be in touch when that has returned. I will also be in touch with your labs.  If you are feeling worse, please go to the ER.  Take care,  [name] PA-C

## 2020-05-02 ENCOUNTER — Telehealth: Payer: Self-pay | Admitting: Interventional Cardiology

## 2020-05-02 ENCOUNTER — Observation Stay (HOSPITAL_COMMUNITY): Payer: Medicare Other

## 2020-05-02 ENCOUNTER — Ambulatory Visit (HOSPITAL_BASED_OUTPATIENT_CLINIC_OR_DEPARTMENT_OTHER): Payer: Medicare Other

## 2020-05-02 ENCOUNTER — Encounter (HOSPITAL_COMMUNITY): Payer: Self-pay | Admitting: Family Medicine

## 2020-05-02 DIAGNOSIS — E785 Hyperlipidemia, unspecified: Secondary | ICD-10-CM | POA: Diagnosis not present

## 2020-05-02 DIAGNOSIS — K7581 Nonalcoholic steatohepatitis (NASH): Secondary | ICD-10-CM | POA: Diagnosis present

## 2020-05-02 DIAGNOSIS — D4989 Neoplasm of unspecified behavior of other specified sites: Secondary | ICD-10-CM

## 2020-05-02 DIAGNOSIS — R296 Repeated falls: Secondary | ICD-10-CM | POA: Diagnosis present

## 2020-05-02 DIAGNOSIS — K3189 Other diseases of stomach and duodenum: Secondary | ICD-10-CM | POA: Diagnosis not present

## 2020-05-02 DIAGNOSIS — N390 Urinary tract infection, site not specified: Secondary | ICD-10-CM | POA: Diagnosis not present

## 2020-05-02 DIAGNOSIS — K2950 Unspecified chronic gastritis without bleeding: Secondary | ICD-10-CM | POA: Diagnosis not present

## 2020-05-02 DIAGNOSIS — I700 Atherosclerosis of aorta: Secondary | ICD-10-CM | POA: Diagnosis not present

## 2020-05-02 DIAGNOSIS — Z90710 Acquired absence of both cervix and uterus: Secondary | ICD-10-CM | POA: Diagnosis not present

## 2020-05-02 DIAGNOSIS — F419 Anxiety disorder, unspecified: Secondary | ICD-10-CM | POA: Diagnosis not present

## 2020-05-02 DIAGNOSIS — I10 Essential (primary) hypertension: Secondary | ICD-10-CM | POA: Diagnosis not present

## 2020-05-02 DIAGNOSIS — F329 Major depressive disorder, single episode, unspecified: Secondary | ICD-10-CM | POA: Diagnosis present

## 2020-05-02 DIAGNOSIS — R0600 Dyspnea, unspecified: Secondary | ICD-10-CM | POA: Diagnosis not present

## 2020-05-02 DIAGNOSIS — Z853 Personal history of malignant neoplasm of breast: Secondary | ICD-10-CM | POA: Diagnosis not present

## 2020-05-02 DIAGNOSIS — G4733 Obstructive sleep apnea (adult) (pediatric): Secondary | ICD-10-CM | POA: Diagnosis present

## 2020-05-02 DIAGNOSIS — R1900 Intra-abdominal and pelvic swelling, mass and lump, unspecified site: Secondary | ICD-10-CM | POA: Diagnosis not present

## 2020-05-02 DIAGNOSIS — J9859 Other diseases of mediastinum, not elsewhere classified: Secondary | ICD-10-CM | POA: Diagnosis not present

## 2020-05-02 DIAGNOSIS — D122 Benign neoplasm of ascending colon: Secondary | ICD-10-CM | POA: Diagnosis not present

## 2020-05-02 DIAGNOSIS — C37 Malignant neoplasm of thymus: Secondary | ICD-10-CM | POA: Diagnosis not present

## 2020-05-02 DIAGNOSIS — Z6829 Body mass index (BMI) 29.0-29.9, adult: Secondary | ICD-10-CM | POA: Diagnosis not present

## 2020-05-02 DIAGNOSIS — K2961 Other gastritis with bleeding: Secondary | ICD-10-CM | POA: Diagnosis not present

## 2020-05-02 DIAGNOSIS — R195 Other fecal abnormalities: Secondary | ICD-10-CM | POA: Diagnosis not present

## 2020-05-02 DIAGNOSIS — I2510 Atherosclerotic heart disease of native coronary artery without angina pectoris: Secondary | ICD-10-CM | POA: Diagnosis not present

## 2020-05-02 DIAGNOSIS — D62 Acute posthemorrhagic anemia: Secondary | ICD-10-CM | POA: Diagnosis not present

## 2020-05-02 DIAGNOSIS — R194 Change in bowel habit: Secondary | ICD-10-CM

## 2020-05-02 DIAGNOSIS — R739 Hyperglycemia, unspecified: Secondary | ICD-10-CM | POA: Diagnosis not present

## 2020-05-02 DIAGNOSIS — K219 Gastro-esophageal reflux disease without esophagitis: Secondary | ICD-10-CM | POA: Diagnosis present

## 2020-05-02 DIAGNOSIS — Z20822 Contact with and (suspected) exposure to covid-19: Secondary | ICD-10-CM | POA: Diagnosis not present

## 2020-05-02 DIAGNOSIS — K2960 Other gastritis without bleeding: Secondary | ICD-10-CM | POA: Diagnosis not present

## 2020-05-02 DIAGNOSIS — Z8673 Personal history of transient ischemic attack (TIA), and cerebral infarction without residual deficits: Secondary | ICD-10-CM | POA: Diagnosis not present

## 2020-05-02 DIAGNOSIS — D72819 Decreased white blood cell count, unspecified: Secondary | ICD-10-CM | POA: Diagnosis present

## 2020-05-02 DIAGNOSIS — C7800 Secondary malignant neoplasm of unspecified lung: Secondary | ICD-10-CM | POA: Diagnosis not present

## 2020-05-02 DIAGNOSIS — M797 Fibromyalgia: Secondary | ICD-10-CM | POA: Diagnosis present

## 2020-05-02 DIAGNOSIS — K581 Irritable bowel syndrome with constipation: Secondary | ICD-10-CM | POA: Diagnosis present

## 2020-05-02 DIAGNOSIS — I272 Pulmonary hypertension, unspecified: Secondary | ICD-10-CM | POA: Diagnosis not present

## 2020-05-02 DIAGNOSIS — R531 Weakness: Secondary | ICD-10-CM | POA: Diagnosis not present

## 2020-05-02 DIAGNOSIS — K314 Gastric diverticulum: Secondary | ICD-10-CM | POA: Diagnosis not present

## 2020-05-02 DIAGNOSIS — E663 Overweight: Secondary | ICD-10-CM | POA: Diagnosis present

## 2020-05-02 DIAGNOSIS — D649 Anemia, unspecified: Secondary | ICD-10-CM | POA: Diagnosis not present

## 2020-05-02 DIAGNOSIS — K635 Polyp of colon: Secondary | ICD-10-CM | POA: Diagnosis not present

## 2020-05-02 DIAGNOSIS — N281 Cyst of kidney, acquired: Secondary | ICD-10-CM | POA: Diagnosis not present

## 2020-05-02 LAB — BASIC METABOLIC PANEL
Anion gap: 9 (ref 5–15)
BUN: 18 mg/dL (ref 8–23)
CO2: 23 mmol/L (ref 22–32)
Calcium: 9 mg/dL (ref 8.9–10.3)
Chloride: 103 mmol/L (ref 98–111)
Creatinine, Ser: 0.82 mg/dL (ref 0.44–1.00)
GFR calc Af Amer: >60 mL/min (ref >60)
GFR calc non Af Amer: >60 mL/min (ref >60)
Glucose, Bld: 106 mg/dL — ABNORMAL HIGH (ref 70–99)
Potassium: 4.4 mmol/L (ref 3.5–5.1)
Sodium: 135 mmol/L (ref 135–145)

## 2020-05-02 LAB — CBC
HCT: 25.5 % — ABNORMAL LOW (ref 36.0–46.0)
Hemoglobin: 7.9 g/dL — ABNORMAL LOW (ref 12.0–15.0)
MCH: 27.6 pg (ref 26.0–34.0)
MCHC: 31 g/dL (ref 30.0–36.0)
MCV: 89.2 fL (ref 80.0–100.0)
Platelets: 190 10*3/uL (ref 150–400)
RBC: 2.86 MIL/uL — ABNORMAL LOW (ref 3.87–5.11)
RDW: 15.9 % — ABNORMAL HIGH (ref 11.5–15.5)
WBC: 2.9 10*3/uL — ABNORMAL LOW (ref 4.0–10.5)
nRBC: 0 % (ref 0.0–0.2)

## 2020-05-02 LAB — SARS CORONAVIRUS 2 BY RT PCR (HOSPITAL ORDER, PERFORMED IN ~~LOC~~ HOSPITAL LAB): SARS Coronavirus 2: NEGATIVE

## 2020-05-02 LAB — POC OCCULT BLOOD, ED: Fecal Occult Bld: POSITIVE — AB

## 2020-05-02 MED ORDER — IOHEXOL 350 MG/ML SOLN
100.0000 mL | Freq: Once | INTRAVENOUS | Status: AC | PRN
  Administered 2020-05-02: 100 mL via INTRAVENOUS

## 2020-05-02 MED ORDER — METOCLOPRAMIDE HCL 5 MG/ML IJ SOLN
10.0000 mg | Freq: Once | INTRAMUSCULAR | Status: AC
  Administered 2020-05-02: 10 mg via INTRAVENOUS
  Filled 2020-05-02: qty 2

## 2020-05-02 MED ORDER — PEG-KCL-NACL-NASULF-NA ASC-C 100 G PO SOLR
0.5000 | Freq: Once | ORAL | Status: AC
  Administered 2020-05-02: 100 g via ORAL

## 2020-05-02 MED ORDER — BISACODYL 5 MG PO TBEC
20.0000 mg | DELAYED_RELEASE_TABLET | Freq: Once | ORAL | Status: AC
  Administered 2020-05-02: 20 mg via ORAL
  Filled 2020-05-02: qty 4

## 2020-05-02 MED ORDER — PEG-KCL-NACL-NASULF-NA ASC-C 100 G PO SOLR: 1.0000 | Freq: Once | ORAL | Status: DC

## 2020-05-02 MED ORDER — PEG-KCL-NACL-NASULF-NA ASC-C 100 G PO SOLR
0.5000 | Freq: Once | ORAL | Status: AC
  Administered 2020-05-02: 100 g via ORAL
  Filled 2020-05-02: qty 1

## 2020-05-02 NOTE — Telephone Encounter (Signed)
Spoke with Autumn Conley and made her aware that 9/3 is the first available.  Advised I can schedule her that day and then add her to the wait list incase we have cancellations.  Autumn Conley agreeable to plan.

## 2020-05-02 NOTE — Telephone Encounter (Signed)
    Spoke with Pt's daughter, pt is in the hospital, she said they really want to see Dr. Tamala Julian and can't wait until 09/03 for appt.she said the whole family is seeing DR. Tamala Julian and wanted to stay with him. They would like to ask if they can get sooner appt once pt is back home from hospital

## 2020-05-02 NOTE — ED Notes (Signed)
Patient is getting unit of blood

## 2020-05-02 NOTE — ED Notes (Signed)
Breakfast Ordered--Autumn Conley  

## 2020-05-02 NOTE — Consult Note (Addendum)
[facility] Consult: 12:59 PM 05/02/2020  LOS: 0 days    Referring Provider: Dr Alfredia Ferguson Primary Care Physician:  [name], PA Primary Gastroenterologist:  Dr Lana Fish  Oncology: [facility].     Reason for Consultation:  anemia   HPI: Autumn Conley is a 74 y.o. female.  Hx metastatic thymoma w pleural implants, completed chemo followed by radiation completed 01/2020.  CVA.  Fibromyalgia.  Hysterectomy, appendectomy. Breast cancer, mastectomy 2004.  Sleep apnea.  Hepatic steatosis, elevated LFTs dating to 2014.  IBS-C Colonoscopy ~ 2013 in [place], unremarkable per pt.  Negative  Cologuard within last 1 to 2 years.   No previous upper endoscopy.  CTAP w contrast 11/08/2019: for RLQ pain: mild inflammation at cecum, ? Infectious vs inflammatory colitis.  Masslike appearance of the right lateral wall of the cecum with a few mildly enlarged regional lymph nodes. As an underlying mass cannot be excluded, follow-up with a nonemergent outpatient colonoscopy is recommended. Completed 10 d Augmentin.   11/15/19 PET scan: negative for colon mass.   Pt fup w Dr Earlean Shawl 11/25/19: repeated CTAP w contrast 12/05/19: No CT evidence of gastroenteritis or colitis. No abscess identified. Hepatic steatosis and mild hepatomegaly. Spleen normal size.    C/o weak, dizzy, DOE.  Falls due to weakness not syncope.   PMD planned CT for tmrw after labs but pt came to ED.   Hgb 7.6, was 10.4 in mid Jan 2021.  MCV 89.   Mild elevation transaminases 57/50, normal alk phos and T bili.   CT angio chest: no PE.  Interval improved mediastinal mass abutting ascending aorta.  CTAP:  2 cm cystic outpouching at gastric fundus, diverticulum vs duplication cyst.  Aortic vasc dz.  L ovarian cyst.  Generally good appetite.  Since starting on as  needed MiraLAX, she is only required its use few times a month at most.  For a few months she has noted small caliber stools.  BMs brown, never bloody.  Preserved appetite, stable weight.  No dysphagia or odynophagia.  No unusual or excessive bleeding or bruising.  Never required blood product transfusion.  Many decades ago, when she was still menstruating she took iron supplements.  She currently takes monthly B12 injections.  Takes low dose ASA.    Family history Did not know her father or his medical history her mother died in her 85s with lung cancer.  Social history Not a drinker or smoker.  Her adult daughter is very attentive to the patient's needs.  Past Medical History:  Diagnosis Date  . Anemia   . Anxiety   . Cancer ([place])   . Depression   . Dyspnea   . Fibromyalgia    "some; not chronic" (11/29/2014)  . GERD (gastroesophageal reflux disease)   . Glaucoma of both eyes   . Headache   . Hypertension   . IBS (irritable bowel syndrome)   . Mitral valve prolapse   . NASH (nonalcoholic steatohepatitis)   . Osteoarthritis   . PONV (postoperative nausea and vomiting)   . Stroke (  [facility])     Past Surgical History:  Procedure Laterality Date  . ABDOMINAL HYSTERECTOMY  1980  . APPENDECTOMY  1953  . BREAST BIOPSY Left   . BREAST LUMPECTOMY Left   . CATARACT EXTRACTION, BILATERAL  2018  . MASTECTOMY Left ~ 2009  . PARASTERNAL EXPLORATION Right 02/14/2019   Procedure: PARASTERNAL MEDIAL EXPLORATION WITH BIOPIES.;  Surgeon: [name], MD;  Location: [facility] OR;  Service: Thoracic;  Laterality: Right;    Prior to Admission medications   Medication Sig Start Date End Date Taking? Authorizing Provider  ALPRAZolam Duanne Moron) 0.25 MG tablet Take 0.5 tablets (0.125 mg total) by mouth daily as needed for anxiety. 02/27/20  Yes [name], PA  Ascorbic Acid (VITAMIN C) 1000 MG tablet Take 1,000 mg by mouth daily.   Yes [provider]  aspirin EC 81 MG tablet Take 81 mg by  mouth at bedtime.    Yes [provider]  atorvastatin (LIPITOR) 40 MG tablet TAKE 1 TABLET BY MOUTH AT  BEDTIME Patient taking differently: Take 40 mg by mouth at bedtime.  11/29/19  Yes Leamon Arnt, MD  butalbital-acetaminophen-caffeine [facility]) [phone] MG tablet Take 1-2 tablets by mouth every 6 (six) hours as needed for headache. 03/05/20 03/05/21 Yes [name], PA  Cholecalciferol (VITAMIN D-3 PO) Take 1 capsule by mouth daily.   Yes [provider]  cyanocobalamin (,VITAMIN B-12,) 1000 MCG/ML injection Inject 1 mL (1,000 mcg total) into the muscle every 30 (thirty) days. 03/01/20  Yes [name], PA  gabapentin (NEURONTIN) 300 MG capsule Take 300 mg by mouth 2 (two) times daily.  10/14/18  Yes [provider]  irbesartan (AVAPRO) 150 MG tablet TAKE 1 TABLET BY MOUTH  DAILY Patient taking differently: Take 150 mg by mouth in the morning.  12/26/19  Yes Leamon Arnt, MD  ondansetron (ZOFRAN) 8 MG tablet Take 8 mg by mouth every 8 (eight) hours as needed for nausea or vomiting.   Yes [provider]  PARoxetine (PAXIL) 20 MG tablet TAKE 3 TABLETS BY MOUTH IN  THE MORNING Patient taking differently: Take 60 mg by mouth in the morning.  12/26/19  Yes Leamon Arnt, MD  polyethylene glycol powder (GLYCOLAX/MIRALAX) 17 GM/SCOOP powder Take 17 g by mouth daily as needed for mild constipation.   Yes [provider]  SUMAtriptan (IMITREX) 100 MG tablet Take 1 tablet (100 mg total) by mouth 2 (two) times daily as needed. At least 2 hrs between doses as needed bid Patient taking differently: Take 100 mg by mouth 2 (two) times daily as needed for migraine or headache (and may repeat once in 2 hours, if no relief).  02/27/20  Yes [name], PA  valACYclovir (VALTREX) 1000 MG tablet Take two tablets ( total 2000 mg) by mouth q12h x 1 day; Start: ASAP after symptom onset Patient taking differently: Take 2,000 mg by mouth See admin  instructions. Take 2,000 mg by mouth immediately at onset of fever blisters every 12 hours for 1 day, as directed 03/05/20  Yes Morene Rankins, [place], PA  vitamin E (VITAMIN E) 180 MG (400 UNITS) capsule Take 400 Units by mouth daily.   Yes [provider]  cycloSPORINE (RESTASIS) 0.05 % ophthalmic emulsion Place 1 drop into both eyes 2 (two) times daily. Patient not taking: Reported on 05/01/2020    [provider]  oxyCODONE (OXY IR/ROXICODONE) 5 MG immediate release tablet Take 1 tablet (5 mg total) by mouth every 6 (six) hours as  needed for severe pain. Patient not taking: Reported on 05/01/2020 02/16/19   Gold, Patrick Jupiter E, PA-C  SYRINGE-NEEDLE, DISP, 3 ML 25G X 1" 3 ML MISC Use to inject Vit B12 once a month. 03/01/20   [name], PA    Scheduled Meds: . aspirin EC  81 mg Oral QHS  . atorvastatin  40 mg Oral QHS  . enoxaparin (LOVENOX) injection  40 mg Subcutaneous Q24H  . gabapentin  300 mg Oral BID  . irbesartan  150 mg Oral q AM  . PARoxetine  60 mg Oral q AM   Infusions:  PRN Meds: acetaminophen **OR** acetaminophen, albuterol, ALPRAZolam, polyethylene glycol   Allergies as of 05/01/2020 - Review Complete 05/01/2020  Allergen Reaction Noted  . Latex Rash and Other (See Comments) 05/01/2020    Family History  Problem Relation Age of Onset  . Lung cancer Mother 46  . Prostate cancer Brother 59    Social History   Socioeconomic History  . Marital status: Married    Spouse name: Not on file  . Number of children: 1  . Years of education: 27  . Highest education level: Not on file  Occupational History  . Occupation: Retired  Tobacco Use  . Smoking status: Never Smoker  . Smokeless tobacco: Never Used  Substance and Sexual Activity  . Alcohol use: No  . Drug use: No  . Sexual activity: Never  Other Topics Concern  . Not on file  Social History Narrative   Lives at home with her husband.   Right-handed.   2 cups caffeine/day.   Social  Determinants of Health   Financial Resource Strain:   . Difficulty of Paying Living Expenses:   Food Insecurity:   . Worried About Charity fundraiser in the Last Year:   . Arboriculturist in the Last Year:   Transportation Needs:   . Film/video editor (Medical):   Marland Kitchen Lack of Transportation (Non-Medical):   Physical Activity:   . Days of Exercise per Week:   . Minutes of Exercise per Session:   Stress:   . Feeling of Stress :   Social Connections:   . Frequency of Communication with Friends and Family:   . Frequency of Social Gatherings with Friends and Family:   . Attends Religious Services:   . Active Member of Clubs or Organizations:   . Attends Archivist Meetings:   Marland Kitchen Marital Status:   Intimate Partner Violence:   . Fear of Current or Ex-Partner:   . Emotionally Abused:   Marland Kitchen Physically Abused:   . Sexually Abused:     REVIEW OF SYSTEMS: Constitutional: Weakness, dizziness, fatigue. ENT:  No nose bleeds Pulm: Dyspnea on exertion, not profound.  No cough. CV:  No palpitations, no LE edema.  GU:  No hematuria, no frequency GI: See HPI. Heme: See HPI. Transfusions: See HPI. Neuro:  No headaches, no peripheral tingling or numbness.   Dizziness without syncope.  No seizures. Derm:  No itching, no rash or sores.  Endocrine:  No sweats or chills.  No polyuria or dysuria Immunization: Has completed vaccination for COVID-19. Travel:  None beyond local counties in last few months.    PHYSICAL EXAM: Vital signs in last 24 hours: Vitals:   05/02/20 0816 05/02/20 1000  BP:  116/65  Pulse:  88  Resp: 19 16  Temp:    SpO2:  99%   Wt Readings from Last 3 Encounters:  05/01/20 75.9 kg  03/05/20 75.4 kg  02/27/20 77.8 kg    General: Pleasant, nonill appearing elderly woman who is in no distress.  Alert and able to provide good history. Head: No facial asymmetry or swelling.  No signs of head trauma. Eyes: Conjunctiva slightly pale.  No scleral icterus.   EOMI. Ears: Not hard of hearing Nose: No congestion or discharge Mouth: Good dentition.  Tongue midline.  Oral mucosa pink, moist, clear. Neck: No JVD, masses, thyromegaly. Lungs: Clear bilaterally, excellent breath sounds.  No cough.  No dyspnea. Heart: RRR.  No MRG.  S1, S2 present. Abdomen: Soft.  Active bowel sounds.  Not distended or tender.  No HSM, masses, bruits, hernias.  Well-healed 2 to 3 inch vertical scar in right lower quadrant..   Rectal: Deferred Musc/Skeltl: No joint redness, swelling or gross deformities. Extremities: No CCE. Neurologic: Fully alert.  Oriented x3.  No tremors or gross weakness.  Moves all 4 limbs, strength not tested Skin: No telangiectasia.  No sores or suspicious lesions.  Minor bruising at site of IV catheter on right arm Nodes: No cervical adenopathy Psych: Cooperative, pleasant, calm, fluid speech.  Intake/Output from previous day: No intake/output data recorded. Intake/Output this shift: Total I/O In: -  Out: 1200 [Urine:1200]  LAB RESULTS: Recent Labs    05/01/20 1405 05/01/20 1551 05/02/20 0500  WBC 4.4 4.2 2.9*  HGB 7.6 Repeated and verified X2.* 7.1* 7.9*  HCT 23.2 Repeated and verified X2.* 23.3* 25.5*  PLT 245.0 235 190   BMET Lab Results  Component Value Date   NA 135 05/02/2020   NA 137 05/01/2020   NA 136 05/01/2020   K 4.4 05/02/2020   K 4.1 05/01/2020   K 4.7 05/01/2020   CL 103 05/02/2020   CL 100 05/01/2020   CL 100 05/01/2020   CO2 23 05/02/2020   CO2 25 05/01/2020   CO2 27 05/01/2020   GLUCOSE 106 (H) 05/02/2020   GLUCOSE 184 (H) 05/01/2020   GLUCOSE 105 (H) 05/01/2020   BUN 18 05/02/2020   BUN 16 05/01/2020   BUN 19 05/01/2020   CREATININE 0.82 05/02/2020   CREATININE 1.00 05/01/2020   CREATININE 0.84 05/01/2020   CALCIUM 9.0 05/02/2020   CALCIUM 9.3 05/01/2020   CALCIUM 9.7 05/01/2020   LFT Recent Labs    05/01/20 1405 05/01/20 1551  PROT 7.1 6.8  ALBUMIN 4.6 3.8  AST 45* 57*  ALT 45* 50*    ALKPHOS 116 106  BILITOT 0.4 0.2*   PT/INR Lab Results  Component Value Date   INR 1.0 02/10/2019   INR 0.9 01/31/2019   Hepatitis Panel No results for input(s): HEPBSAG, HCVAB, HEPAIGM, HEPBIGM in the last 72 hours. C-Diff No components found for: CDIFF Lipase     Component Value Date/Time   LIPASE 32 11/07/2019 2303    Drugs of Abuse  No results found for: LABOPIA, COCAINSCRNUR, LABBENZ, AMPHETMU, THCU, LABBARB   RADIOLOGY STUDIES: DG Chest 2 View  Result Date: 05/01/2020 CLINICAL DATA:  Weakness, short of breath, history of left breast cancer EXAM: CHEST - 2 VIEW COMPARISON:  02/24/2019 FINDINGS: Frontal and lateral views of the chest demonstrate a stable cardiac silhouette. No airspace disease, effusion, or pneumothorax. Stable postsurgical changes from left mastectomy. No acute bony abnormalities. IMPRESSION: 1. No acute intrathoracic process. 2. The right hilar/anterior mediastinal mass seen on prior examination is not seen today. Electronically Signed   By: Randa Ngo M.D.   On: 05/01/2020 16:27   CT ANGIO CHEST PE  W OR WO CONTRAST  Result Date: 05/02/2020 CLINICAL DATA:  Dyspnea on exertion, dizziness EXAM: CT ANGIOGRAPHY CHEST WITH CONTRAST TECHNIQUE: Multidetector CT imaging of the chest was performed using the standard protocol during bolus administration of intravenous contrast. Multiplanar CT image reconstructions and MIPs were obtained to evaluate the vascular anatomy. CONTRAST:  122m OMNIPAQUE IOHEXOL 350 MG/ML SOLN COMPARISON:  12/29/2018 FINDINGS: Cardiovascular: There is adequate opacification of the pulmonary arterial tree. No intraluminal filling defect is seen to suggest pulmonary embolism. The central pulmonary arteries are mildly enlarged suggesting changes of pulmonary arterial hypertension. There is left ventricular hypertrophy noted, however, global cardiac size is within normal limits. Moderate coronary artery calcification. Moderate calcification of the  mitral valve annulus. No pericardial effusion. However, lobulated soft tissue noted on a prior examination has significantly decreased in size, though residual lobular soft tissue persists, abutting the ascending aorta, measuring roughly 5.1 x 1.4 cm at axial image # 52/5. The thoracic aorta is otherwise age-appropriate with mild atherosclerotic calcification noted. Mediastinum/Nodes: Left mastectomy has been performed. No pathologic thoracic adenopathy identified. Lobulated soft tissue within the anterior mediastinum persists, as noted above. Small hiatal hernia present. Lungs/Pleura: Scarring within the right apex is unchanged. Lungs are otherwise clear. No pneumothorax or pleural effusion. Central airways are widely patent. Upper Abdomen: Small diverticulum arising from the gastric fundus is better assessed on prior examinations. Limited images of the upper abdomen are otherwise unremarkable. Musculoskeletal: No lytic or blastic bone lesions Review of the MIP images confirms the above findings. IMPRESSION: No pulmonary embolism. Left ventricular hypertrophy. Central pulmonary arterial enlargement suggests pulmonary artery hypertension and may relate to possible diastolic dysfunction. Marked interval improvement in lobular anterior mediastinal mass abutting the as sending aorta. Aortic Atherosclerosis (ICD10-I70.0). Electronically Signed   By: AFidela SalisburyMD   On: 05/02/2020 01:19   CT ABDOMEN PELVIS W CONTRAST  Result Date: 05/02/2020 CLINICAL DATA:  74year old female with concern for intra-abdominal mass. History of thymoma. EXAM: CT ABDOMEN AND PELVIS WITH CONTRAST TECHNIQUE: Multidetector CT imaging of the abdomen and pelvis was performed using the standard protocol following bolus administration of intravenous contrast. CONTRAST:  1070mOMNIPAQUE IOHEXOL 350 MG/ML SOLN COMPARISON:  CT abdomen pelvis dated 11/08/2019. FINDINGS: Lower chest: Please see report for accompanying chest CT. No  intra-abdominal free air or free fluid. Hepatobiliary: No focal liver abnormality is seen. No gallstones, gallbladder wall thickening, or biliary dilatation. Pancreas: Unremarkable. No pancreatic ductal dilatation or surrounding inflammatory changes. Spleen: Normal in size without focal abnormality. Adrenals/Urinary Tract: The adrenal glands are unremarkable. There is no hydronephrosis on either side. There is symmetric enhancement and excretion of contrast by both kidneys. There is a 1 cm left renal inferior pole cyst. Subcentimeter right renal hypodense lesion is too small to characterize. The visualized ureters and urinary bladder appear unremarkable. Stomach/Bowel: There is a 2 cm cystic or pocket like outpouching from the gastric fundus (105/5 and 59/6), likely a gastric diverticulum or an enteric duplication cyst. There is no bowel obstruction or active inflammation. There is interposition of the proximal colon anterior to the right lobe of the liver. The appendix is not visualized with certainty. No inflammatory changes identified in the right lower quadrant. Vascular/Lymphatic: Moderate aortoiliac atherosclerotic disease. The IVC is unremarkable. No portal venous gas. There is no adenopathy. Reproductive: Hysterectomy. Probable 2 cm left ovarian cyst. This is similar to prior CT and can be better evaluated with ultrasound on a nonemergent/outpatient basis. Other: None Musculoskeletal: Osteopenia with degenerative changes of the  spine. No acute osseous pathology. IMPRESSION: 1. No acute intra-abdominal or pelvic pathology. 2. A 2 cm cystic or pocket like outpouching from the gastric fundus, likely a gastric diverticulum or an enteric duplication cyst. 3. Probable 2 cm left ovarian cyst. This is similar to prior CT and can be better evaluated with ultrasound on a nonemergent/outpatient basis. 4. Aortic Atherosclerosis (ICD10-I70.0). Electronically Signed   By: Anner Crete M.D.   On: 05/02/2020 01:10      IMPRESSION:   *  Normocytic anemia.  Acute on chronic 1 PRBC completed, fup H/H pndg  *   Cecal inflammation, mass like inflammation w ? Colitis in mid 10/2019. No mass on PET 10/2019 and resolved colitis/mass on fup 12/05/19 CT.    *   Hepatic steatosis, hepatomegaly, mild elevation transaminases.  Evaluated in 2014.    *   IBS-C.  Stool caliber reduced in last 2 Months.    *    Metastatic thymoma.  Completed chemo thenXRT in 01/2020.   CT chest 6/1 and again 7/14 show decrease in mass and pleural implants, no new mets.  At 03/20/20 oncology OV "Imaginig consistent with good response to treatment, no new sites of disease. We will have her return with repeat CT chest in 3 months".    PLAN:     *   Colonoscopy, EGD, ? Timing.  Pt prefers inpt if possible.    Azucena Freed  05/02/2020, 12:59 PM Phone [phone]    [place] Attending   I have taken an interval history, reviewed the chart and examined the patient. I agree with the Advanced Practitioner's note, impression and recommendations.     Heme + anemia - worse; change in bowel habits  EGD and colonoscopy tomorrow  The risks and benefits as well as alternatives of endoscopic procedure(s) have been discussed and reviewed. All questions answered. The patient agrees to proceed.  Gatha Mayer, MD, [facility] 05/02/2020 5:09 PM

## 2020-05-02 NOTE — Progress Notes (Signed)
PROGRESS NOTE    Owen Pagnotta  YKD:[id number] DOB: Feb 12, 1946 DOA: 05/01/2020 PCP: [name], PA   Brief Narrative:  HPI per Dr. Harrold Donath on 05/01/20  Jamiria Langill is a 74 y.o. female with medical history significant for thyoma being followed by Oncology, Hx of Pagets disease of breast, HTN, HLD, anxiety, IBS, NASH, fibromyalgia who presents for evaluation of generalized weakness, dizziness, shortness of breath with exertion.  Patient saw her PCP this morning and was called in the afternoon and told that her hemoglobin and hematocrit levels were decreased and she needed to come to the emergency room for evaluation.  She reports for the last few weeks she has had a low energy level and when she stands up she becomes dizzy and "woozy".  She has not had any loss of consciousness.  Reports she has fallen a few times over the last few weeks secondary to being weak and feeling lightheaded but did not have any injury.  She states she has not had any abdominal pain, abdominal cramps, nausea, vomiting, diarrhea.  Denies having any black tarry stool.  She reports she has never had GI bleeding or ulcers.  She denies any fever, chills, cough, upper respiratory symptoms.  She reports she does have urinary frequency and some mild dysuria and has had to UTIs in the last few months.  She states that her PCP was sending her to [facility] this morning to have a CT of her chest to rule out pulmonary embolism to make sure this was not a cause of her shortness of breath.  CT was not performed as the facility did not have any labs back on the patient and was being rescheduled for tomorrow. She is being followed by oncology at [facility] for her thymoma.  She underwent radiation and chemotherapy with the last treatment in December 2020.  She is still followed by oncology.  It is noted that she had a CT scan last year which showed a possible mass in part of her colon saw GI but no biopsy or colonoscopy was done at  that time.  ED Course: In the emergency room patient is hemodynamically stable.  She is Hemoccult negative on testing.  She has hemoglobin of 7.2 with hematocrit of 23.3.  She denies any chest pain or palpitations.  Urinalysis is positive for UTI.  CT of her abdomen and pelvis to further evaluate mass from previous CT scan to make sure is not growing as ordered.  Have also ordered CT angiography of her chest to rule out PE her symptoms and history of cancer  **Interim History Gastroenterology was consulted for further evaluation recommendations given that she is FOBT positive.  She continues to still be dyspneic but not require any supplemental oxygen.  For signs and symptoms of bleeding.  Assessment & Plan:   Principal Problem:   Anemia Active Problems:   HTN (hypertension)   History of breast cancer   Dyslipidemia   Thymoma -- managed by [name] - extensive documentation in Care Everywhere   Dyspnea   Generalized weakness   UTI (urinary tract infection)   Symptomatic anemia   Symptomatic Anemia -Placed on medical surgical floor for observation overnight however will be changed to inpatient given that she will likely need GI evaluation -Status post 1 unit of pRBC  -Patient's Hgb/Hct went from 7.6/23.2 -> 7.1/23.3 -> 7.9/25.5 after 1 unit of pRBC -Patient has occult POSITIVE in the emergency room -Continue to Monitor for S/Sx of Bleeding -CT Abd/Pelvis  showed "No acute intra-abdominal or pelvic pathology. A 2 cm cystic or pocket like outpouching from the gastric fundus, likely a gastric diverticulum or an enteric duplication cyst. Probable 2 cm left ovarian cyst. This is similar to prior CT and can be better evaluated with ultrasound on a nonemergent/outpatient basis. Aortic Atherosclerosis"  -GI consulted for further evaluation and recommendations; Appreciate their assistance   Dyspnea -Patient with dyspnea which is worsened with exertion.   -Could be secondary to her anemia and  will monitor with correction of anemia with packed red blood cell transfusion. -We will also check CT angiography of chest to rule out PE as etiology of dyspnea as patient does have increased risk with history of cancer and undergoing radiation and chemotherapy recently. -SpO2: 99 % -CTA of Chest showed "No pulmonary embolism. Left ventricular hypertrophy. Central pulmonary arterial enlargement suggests pulmonary artery hypertension and may relate to possible diastolic dysfunction.   Marked interval improvement in lobular anterior mediastinal mass abutting the as sending aorta. Aortic Atherosclerosis."  -She'll need an ambulatory home O2 screen prior to discharge  HTN (Hypertension) -Blood pressure stable.  Continue home medication of irbesartan 150 mg p.o. daily. -Continue to monitor blood pressures per protocol   Dyslipidemia -Continue Atorvastatin 40 mg p.o. nightly  Generalized Weakness -Consult physical therapy for evaluation.  Is contributing to the generalized weakness and should improve with improvement of hemoglobin level  Anxiety and Depression/Fibromyalgia -Continue with alprazolam 0.125 mg p.o. daily as needed anxiety as well as paroxetine 60 mg p.o. every morning -Continue with gabapentin   UTI (urinary tract infection) -Urinalysis showed a cloudy appearance with large leukocytes, positive nitrites, many bacteria, present mucus, few squamous epithelial cells and too numerous to count WBCs -Unfortunately urine culture was never obtained and she was given a one-time dose of fosfomycin -Continue to monitor for urinary symptoms  History of Breast Cancer History of Paget's disease of the breast Thymoma  -- managed by [facility] - extensive documentation in Care Everywhere    DVT prophylaxis: Was started on enoxaparin 40 mg every 24 hours.  Given concern for GI bleeding and for further GI work-up Code Status: FULL CODE Family Communication: Discussed with family at  bedside Disposition Plan: Pending further evaluation by Gastroenterology  Status is: Observation  The patient will require care spanning > 2 midnights and should be moved to inpatient because: Ongoing diagnostic testing needed not appropriate for outpatient work up, Unsafe d/c plan and IV treatments appropriate due to intensity of illness or inability to take PO  Dispo: The patient is from: Home              Anticipated d/c is to: TBD              Anticipated d/c date is: 2 days              Patient currently is not medically stable to d/c.  Consultants:   Gastroenterology    Procedures: None  Antimicrobials:  Anti-infectives (From admission, onward)   Start     Dose/Rate Route Frequency Ordered Stop   05/02/20 0000  fosfomycin (MONUROL) packet 3 g        3 g Oral  Once 05/01/20 2331 05/02/20 0043     Subjective: Seen and examined at bedside and still felt a little short of breath.  Denies any bleeding in her stool and states that her stool was "brown".  No chest pain, lightheadedness or dizziness currently.  No nausea or vomiting.  No  other concerns or complaints at this time.  Objective: Vitals:   05/02/20 0816 05/02/20 1000 05/02/20 1316 05/02/20 1345  BP:  116/65 118/61   Pulse:  88 92   Resp: 19 16 20    Temp:    98.2 F (36.8 C)  TempSrc:    Axillary  SpO2:  99% 99%     Intake/Output Summary (Last 24 hours) at 05/02/2020 1348 Last data filed at 05/02/2020 0911 Gross per 24 hour  Intake --  Output 1200 ml  Net -1200 ml   There were no vitals filed for this visit.  Examination: Physical Exam:  Constitutional: WN/WD overweight Caucasian female currently in NAD and appears calm Eyes: Lids and conjunctivae normal, sclerae anicteric  ENMT: External Ears, Nose appear normal. Grossly normal hearing.  Neck: Appears normal, supple, no cervical masses, normal ROM, no appreciable thyromegaly; no JVD Respiratory: Diminished to auscultation bilaterally, no wheezing,  rales, rhonchi or crackles. Normal respiratory effort and patient is not tachypenic. No accessory muscle use.  Unlabored breathing Cardiovascular: RRR, no murmurs / rubs / gallops. S1 and S2 auscultated.  Minimal extremity edema Abdomen: Soft, non-tender, non-distended. Bowel sounds positive.  GU: Deferred. Musculoskeletal: No clubbing / cyanosis of digits/nails. No joint deformity upper and lower extremities.  Skin: No rashes, lesions, ulcers on limited skin evaluation. No induration; Warm and dry.  Neurologic: CN 2-12 grossly intact with no focal deficits. Romberg sign and cerebellar reflexes not assessed.  Psychiatric: Normal judgment and insight. Alert and oriented x 3. Normal mood and appropriate affect.   Data Reviewed: I have personally reviewed following labs and imaging studies  CBC: Recent Labs  Lab 05/01/20 1405 05/01/20 1551 05/02/20 0500  WBC 4.4 4.2 2.9*  NEUTROABS 3.1  --   --   HGB 7.6 Repeated and verified X2.* 7.1* 7.9*  HCT 23.2 Repeated and verified X2.* 23.3* 25.5*  MCV 87.1 90.7 89.2  PLT 245.0 235 209   Basic Metabolic Panel: Recent Labs  Lab 05/01/20 1405 05/01/20 1551 05/02/20 0500  NA 136 137 135  K 4.7 4.1 4.4  CL 100 100 103  CO2 27 25 23   GLUCOSE 105* 184* 106*  BUN 19 16 18   CREATININE 0.84 1.00 0.82  CALCIUM 9.7 9.3 9.0   GFR: Estimated Creatinine Clearance: 58.7 mL/min (by C-G formula based on SCr of 0.82 mg/dL). Liver Function Tests: Recent Labs  Lab 05/01/20 1405 05/01/20 1551  AST 45* 57*  ALT 45* 50*  ALKPHOS 116 106  BILITOT 0.4 0.2*  PROT 7.1 6.8  ALBUMIN 4.6 3.8   No results for input(s): LIPASE, AMYLASE in the last 168 hours. No results for input(s): AMMONIA in the last 168 hours. Coagulation Profile: No results for input(s): INR, PROTIME in the last 168 hours. Cardiac Enzymes: No results for input(s): CKTOTAL, CKMB, CKMBINDEX, TROPONINI in the last 168 hours. BNP (last 3 results) No results for input(s): PROBNP in  the last 8760 hours. HbA1C: No results for input(s): HGBA1C in the last 72 hours. CBG: No results for input(s): GLUCAP in the last 168 hours. Lipid Profile: No results for input(s): CHOL, HDL, LDLCALC, TRIG, CHOLHDL, LDLDIRECT in the last 72 hours. Thyroid Function Tests: Recent Labs    05/01/20 1405  TSH 2.14   Anemia Panel: No results for input(s): VITAMINB12, FOLATE, FERRITIN, TIBC, IRON, RETICCTPCT in the last 72 hours. Sepsis Labs: No results for input(s): PROCALCITON, LATICACIDVEN in the last 168 hours.  Recent Results (from the past 240 hour(s))  SARS Coronavirus 2  by RT PCR (hospital order, performed in [facility] hospital lab) Nasopharyngeal Nasopharyngeal Swab     Status: None   Collection Time: 05/01/20 11:55 PM   Specimen: Nasopharyngeal Swab  Result Value Ref Range Status   SARS Coronavirus 2 NEGATIVE NEGATIVE Final    Comment: (NOTE) SARS-CoV-2 target nucleic acids are NOT DETECTED.  The SARS-CoV-2 RNA is generally detectable in upper and lower respiratory specimens during the acute phase of infection. The lowest concentration of SARS-CoV-2 viral copies this assay can detect is 250 copies / mL. A negative result does not preclude SARS-CoV-2 infection and should not be used as the sole basis for treatment or other patient management decisions.  A negative result may occur with improper specimen collection / handling, submission of specimen other than nasopharyngeal swab, presence of viral mutation(s) within the areas targeted by this assay, and inadequate number of viral copies (<250 copies / mL). A negative result must be combined with clinical observations, patient history, and epidemiological information.  Fact Sheet for Patients:   StrictlyIdeas.no  Fact Sheet for Healthcare Providers: BankingDealers.co.za  This test is not yet approved or  cleared by the [place] FDA and has been authorized for  detection and/or diagnosis of SARS-CoV-2 by FDA under an Emergency Use Authorization (EUA).  This EUA will remain in effect (meaning this test can be used) for the duration of the COVID-19 declaration under Section 564(b)(1) of the Act, 21 U.S.C. section 360bbb-3(b)(1), unless the authorization is terminated or revoked sooner.  Performed at [facility], [place] [address], [place],  [address]      RN Pressure Injury Documentation:     Estimated body mass index is 29.65 kg/m as calculated from the following:   Height as of an earlier encounter on 05/01/20: 5' 3"  (1.6 m).   Weight as of an earlier encounter on 05/01/20: 75.9 kg.  Malnutrition Type:      Malnutrition Characteristics:      Nutrition Interventions:    Radiology Studies: DG Chest 2 View  Result Date: 05/01/2020 CLINICAL DATA:  Weakness, short of breath, history of left breast cancer EXAM: CHEST - 2 VIEW COMPARISON:  02/24/2019 FINDINGS: Frontal and lateral views of the chest demonstrate a stable cardiac silhouette. No airspace disease, effusion, or pneumothorax. Stable postsurgical changes from left mastectomy. No acute bony abnormalities. IMPRESSION: 1. No acute intrathoracic process. 2. The right hilar/anterior mediastinal mass seen on prior examination is not seen today. Electronically Signed   By: Randa Ngo M.D.   On: 05/01/2020 16:27   CT ANGIO CHEST PE W OR WO CONTRAST  Result Date: 05/02/2020 CLINICAL DATA:  Dyspnea on exertion, dizziness EXAM: CT ANGIOGRAPHY CHEST WITH CONTRAST TECHNIQUE: Multidetector CT imaging of the chest was performed using the standard protocol during bolus administration of intravenous contrast. Multiplanar CT image reconstructions and MIPs were obtained to evaluate the vascular anatomy. CONTRAST:  155m OMNIPAQUE IOHEXOL 350 MG/ML SOLN COMPARISON:  12/29/2018 FINDINGS: Cardiovascular: There is adequate opacification of the pulmonary arterial tree. No intraluminal filling  defect is seen to suggest pulmonary embolism. The central pulmonary arteries are mildly enlarged suggesting changes of pulmonary arterial hypertension. There is left ventricular hypertrophy noted, however, global cardiac size is within normal limits. Moderate coronary artery calcification. Moderate calcification of the mitral valve annulus. No pericardial effusion. However, lobulated soft tissue noted on a prior examination has significantly decreased in size, though residual lobular soft tissue persists, abutting the ascending aorta, measuring roughly 5.1 x 1.4 cm at  axial image # 52/5. The thoracic aorta is otherwise age-appropriate with mild atherosclerotic calcification noted. Mediastinum/Nodes: Left mastectomy has been performed. No pathologic thoracic adenopathy identified. Lobulated soft tissue within the anterior mediastinum persists, as noted above. Small hiatal hernia present. Lungs/Pleura: Scarring within the right apex is unchanged. Lungs are otherwise clear. No pneumothorax or pleural effusion. Central airways are widely patent. Upper Abdomen: Small diverticulum arising from the gastric fundus is better assessed on prior examinations. Limited images of the upper abdomen are otherwise unremarkable. Musculoskeletal: No lytic or blastic bone lesions Review of the MIP images confirms the above findings. IMPRESSION: No pulmonary embolism. Left ventricular hypertrophy. Central pulmonary arterial enlargement suggests pulmonary artery hypertension and may relate to possible diastolic dysfunction. Marked interval improvement in lobular anterior mediastinal mass abutting the as sending aorta. Aortic Atherosclerosis (ICD10-I70.0). Electronically Signed   By: Fidela Salisbury MD   On: 05/02/2020 01:19   CT ABDOMEN PELVIS W CONTRAST  Result Date: 05/02/2020 CLINICAL DATA:  74 year old female with concern for intra-abdominal mass. History of thymoma. EXAM: CT ABDOMEN AND PELVIS WITH CONTRAST TECHNIQUE:  Multidetector CT imaging of the abdomen and pelvis was performed using the standard protocol following bolus administration of intravenous contrast. CONTRAST:  142m OMNIPAQUE IOHEXOL 350 MG/ML SOLN COMPARISON:  CT abdomen pelvis dated 11/08/2019. FINDINGS: Lower chest: Please see report for accompanying chest CT. No intra-abdominal free air or free fluid. Hepatobiliary: No focal liver abnormality is seen. No gallstones, gallbladder wall thickening, or biliary dilatation. Pancreas: Unremarkable. No pancreatic ductal dilatation or surrounding inflammatory changes. Spleen: Normal in size without focal abnormality. Adrenals/Urinary Tract: The adrenal glands are unremarkable. There is no hydronephrosis on either side. There is symmetric enhancement and excretion of contrast by both kidneys. There is a 1 cm left renal inferior pole cyst. Subcentimeter right renal hypodense lesion is too small to characterize. The visualized ureters and urinary bladder appear unremarkable. Stomach/Bowel: There is a 2 cm cystic or pocket like outpouching from the gastric fundus (105/5 and 59/6), likely a gastric diverticulum or an enteric duplication cyst. There is no bowel obstruction or active inflammation. There is interposition of the proximal colon anterior to the right lobe of the liver. The appendix is not visualized with certainty. No inflammatory changes identified in the right lower quadrant. Vascular/Lymphatic: Moderate aortoiliac atherosclerotic disease. The IVC is unremarkable. No portal venous gas. There is no adenopathy. Reproductive: Hysterectomy. Probable 2 cm left ovarian cyst. This is similar to prior CT and can be better evaluated with ultrasound on a nonemergent/outpatient basis. Other: None Musculoskeletal: Osteopenia with degenerative changes of the spine. No acute osseous pathology. IMPRESSION: 1. No acute intra-abdominal or pelvic pathology. 2. A 2 cm cystic or pocket like outpouching from the gastric fundus,  likely a gastric diverticulum or an enteric duplication cyst. 3. Probable 2 cm left ovarian cyst. This is similar to prior CT and can be better evaluated with ultrasound on a nonemergent/outpatient basis. 4. Aortic Atherosclerosis (ICD10-I70.0). Electronically Signed   By: AAnner CreteM.D.   On: 05/02/2020 01:10   Scheduled Meds: . aspirin EC  81 mg Oral QHS  . atorvastatin  40 mg Oral QHS  . enoxaparin (LOVENOX) injection  40 mg Subcutaneous Q24H  . gabapentin  300 mg Oral BID  . irbesartan  150 mg Oral q AM  . PARoxetine  60 mg Oral q AM   Continuous Infusions:   LOS: 0 days   OKerney Elbe DO [facility] PAGER is on [place] If 7PM-7AM,  please contact night-coverage [email or web address]

## 2020-05-02 NOTE — ED Notes (Signed)
Pt displaying no signs and or symptoms of reactions at this time, infusing will be increased ,please see flowsheet. Bed low and locked rails x2 , call light in reach, attached to cardiac monitor, blood infusing without difficulty

## 2020-05-03 ENCOUNTER — Encounter (HOSPITAL_COMMUNITY)
Admission: EM | Disposition: A | Discharge status: Home / Self Care | Payer: Self-pay | Source: Home / Self Care | Attending: Internal Medicine

## 2020-05-03 ENCOUNTER — Inpatient Hospital Stay (HOSPITAL_COMMUNITY): Payer: Medicare Other | Admitting: Certified Registered Nurse Anesthetist

## 2020-05-03 ENCOUNTER — Encounter (HOSPITAL_COMMUNITY): Payer: Self-pay | Admitting: Internal Medicine

## 2020-05-03 DIAGNOSIS — D122 Benign neoplasm of ascending colon: Secondary | ICD-10-CM

## 2020-05-03 DIAGNOSIS — R195 Other fecal abnormalities: Secondary | ICD-10-CM

## 2020-05-03 DIAGNOSIS — K2960 Other gastritis without bleeding: Secondary | ICD-10-CM

## 2020-05-03 DIAGNOSIS — R194 Change in bowel habit: Secondary | ICD-10-CM

## 2020-05-03 HISTORY — PX: COLONOSCOPY WITH PROPOFOL: SHX5780

## 2020-05-03 HISTORY — PX: POLYPECTOMY: SHX5525

## 2020-05-03 HISTORY — PX: BIOPSY: SHX5522

## 2020-05-03 HISTORY — PX: ESOPHAGOGASTRODUODENOSCOPY (EGD) WITH PROPOFOL: SHX5813

## 2020-05-03 LAB — TYPE AND SCREEN
ABO/RH(D): O POS
Antibody Screen: NEGATIVE
Unit division: 0

## 2020-05-03 LAB — COMPREHENSIVE METABOLIC PANEL
ALT: 48 U/L — ABNORMAL HIGH (ref 0–44)
AST: 45 U/L — ABNORMAL HIGH (ref 15–41)
Albumin: 4 g/dL (ref 3.5–5.0)
Alkaline Phosphatase: 114 U/L (ref 38–126)
Anion gap: 12 (ref 5–15)
BUN: 13 mg/dL (ref 8–23)
CO2: 22 mmol/L (ref 22–32)
Calcium: 9.4 mg/dL (ref 8.9–10.3)
Chloride: 102 mmol/L (ref 98–111)
Creatinine, Ser: 0.8 mg/dL (ref 0.44–1.00)
GFR calc Af Amer: >60 mL/min (ref >60)
GFR calc non Af Amer: >60 mL/min (ref >60)
Glucose, Bld: 125 mg/dL — ABNORMAL HIGH (ref 70–99)
Potassium: 4.6 mmol/L (ref 3.5–5.1)
Sodium: 136 mmol/L (ref 135–145)
Total Bilirubin: 0.8 mg/dL (ref 0.3–1.2)
Total Protein: 6.9 g/dL (ref 6.5–8.1)

## 2020-05-03 LAB — CBC WITH DIFFERENTIAL/PLATELET
Abs Immature Granulocytes: 0.01 10*3/uL (ref 0.00–0.07)
Basophils Absolute: 0 10*3/uL (ref 0.0–0.1)
Basophils Relative: 1 %
Eosinophils Absolute: 0 10*3/uL (ref 0.0–0.5)
Eosinophils Relative: 1 %
HCT: 28.5 % — ABNORMAL LOW (ref 36.0–46.0)
Hemoglobin: 9 g/dL — ABNORMAL LOW (ref 12.0–15.0)
Immature Granulocytes: 0 %
Lymphocytes Relative: 13 %
Lymphs Abs: 0.5 10*3/uL — ABNORMAL LOW (ref 0.7–4.0)
MCH: 28.1 pg (ref 26.0–34.0)
MCHC: 31.6 g/dL (ref 30.0–36.0)
MCV: 89.1 fL (ref 80.0–100.0)
Monocytes Absolute: 0.3 10*3/uL (ref 0.1–1.0)
Monocytes Relative: 10 %
Neutro Abs: 2.6 10*3/uL (ref 1.7–7.7)
Neutrophils Relative %: 75 %
Platelets: 205 10*3/uL (ref 150–400)
RBC: 3.2 MIL/uL — ABNORMAL LOW (ref 3.87–5.11)
RDW: 15.6 % — ABNORMAL HIGH (ref 11.5–15.5)
WBC: 3.5 10*3/uL — ABNORMAL LOW (ref 4.0–10.5)
nRBC: 0 % (ref 0.0–0.2)

## 2020-05-03 LAB — BPAM RBC
Blood Product Expiration Date: 202108122359
ISSUE DATE / TIME: 202107140057
Unit Type and Rh: 5100

## 2020-05-03 LAB — ANA: Anti Nuclear Antibody (ANA): POSITIVE — AB

## 2020-05-03 LAB — PHOSPHORUS: Phosphorus: 4.4 mg/dL (ref 2.5–4.6)

## 2020-05-03 LAB — ANTI-NUCLEAR AB-TITER (ANA TITER): ANA Titer 1: 1:80 {titer} — ABNORMAL HIGH

## 2020-05-03 LAB — MAGNESIUM: Magnesium: 2 mg/dL (ref 1.7–2.4)

## 2020-05-03 SURGERY — COLONOSCOPY WITH PROPOFOL
Anesthesia: Monitor Anesthesia Care

## 2020-05-03 MED ORDER — PROPOFOL 500 MG/50ML IV EMUL
INTRAVENOUS | Status: DC | PRN
  Administered 2020-05-03: 75 ug/kg/min via INTRAVENOUS

## 2020-05-03 MED ORDER — PROPOFOL 10 MG/ML IV BOLUS
INTRAVENOUS | Status: DC | PRN
  Administered 2020-05-03: 50 mg via INTRAVENOUS

## 2020-05-03 MED ORDER — LIDOCAINE 2% (20 MG/ML) 5 ML SYRINGE
INTRAMUSCULAR | Status: DC | PRN
  Administered 2020-05-03: 50 mg via INTRAVENOUS

## 2020-05-03 MED ORDER — PANTOPRAZOLE SODIUM 40 MG PO TBEC: 40.0000 mg | DELAYED_RELEASE_TABLET | Freq: Every day | ORAL | Status: DC

## 2020-05-03 MED ORDER — PANTOPRAZOLE SODIUM 40 MG PO TBEC: 40.0000 mg | DELAYED_RELEASE_TABLET | Freq: Every day | ORAL | 0 refills | Status: AC

## 2020-05-03 MED ORDER — ONDANSETRON HCL 4 MG/2ML IJ SOLN
INTRAMUSCULAR | Status: DC | PRN
  Administered 2020-05-03: 4 mg via INTRAVENOUS

## 2020-05-03 MED ORDER — LACTATED RINGERS IV SOLN: INTRAVENOUS | Status: DC | PRN

## 2020-05-03 MED ORDER — LACTATED RINGERS IV SOLN
INTRAVENOUS | Status: AC | PRN
  Administered 2020-05-03: 1000 mL via INTRAVENOUS

## 2020-05-03 SURGICAL SUPPLY — 24 items

## 2020-05-03 NOTE — Anesthesia Preprocedure Evaluation (Signed)
Anesthesia Evaluation  Patient identified by MRN, date of birth, ID band Patient awake    Reviewed: Allergy & Precautions, NPO status , Patient's Chart, lab work & pertinent test results  History of Anesthesia Complications (+) PONV  Airway Mallampati: II  TM Distance: >3 FB Neck ROM: Full    Dental no notable dental hx. (+) Teeth Intact, Dental Advisory Given   Pulmonary sleep apnea and Continuous Positive Airway Pressure Ventilation ,    Pulmonary exam normal breath sounds clear to auscultation       Cardiovascular hypertension, Pt. on medications Normal cardiovascular exam Rhythm:Regular Rate:Normal     Neuro/Psych  Headaches, Anxiety    GI/Hepatic GERD  ,  Endo/Other  negative endocrine ROS  Renal/GU negative Renal ROS     Musculoskeletal  (+) Arthritis , Fibromyalgia -  Abdominal   Peds  Hematology  (+) anemia ,   Anesthesia Other Findings   Reproductive/Obstetrics                             Anesthesia Physical Anesthesia Plan  ASA: III  Anesthesia Plan: MAC   Post-op Pain Management:    Induction:   PONV Risk Score and Plan: Treatment may vary due to age or medical condition  Airway Management Planned:   Additional Equipment: None  Intra-op Plan:   Post-operative Plan:   Informed Consent:     Dental advisory given  Plan Discussed with:   Anesthesia Plan Comments:         Anesthesia Quick Evaluation

## 2020-05-03 NOTE — Discharge Summary (Signed)
Physician Discharge Summary  Autumn Conley GYK:[id number] DOB: 1946/02/10 DOA: 05/01/2020  PCP: [name], PA  Admit date: 05/01/2020 Discharge date: 05/03/2020  Admitted From: Home Disposition: Home  Recommendations for Outpatient Follow-up:  1. Follow up with PCP in 1-2 weeks' 2. Follow up with Oncology within 1-2 weeks 3. Follow up with Gastroenterology within 1-2 weeks 4. Please obtain CMP/CBC, Mag, Phos in one week 5. Please follow up on the following pending results: Bx Results from Polyp Removal and from biopsies taken for her gastritis  Home Health: No Equipment/Devices: None  Discharge Condition: Stable CODE STATUS: FULL CODE  Diet recommendation: Heart Healthy Diet   Brief/Interim Summary: HPI per Dr. Leory Plowman Chotiner on 05/01/20 Autumn Conley a 74 y.o.femalewith medical history significant forthyoma being followed by Oncology, Hx of Pagets disease of breast, HTN, HLD, anxiety, IBS, NASH, fibromyalgiawho presents for evaluation of generalized weakness,dizziness, shortness of breath with exertion. Patient saw her PCP this morning and was called in the afternoon and told that her hemoglobin and hematocrit levels were decreased and she needed to come to the emergency room for evaluation. She reports for the last few weeks she has had a low energy level and when she stands up she becomes dizzy and "woozy". She has not had any loss of consciousness. Reports she has fallen a few times over the last few weeks secondary to being weak and feeling lightheaded but did not have any injury. She states she has not had any abdominal pain, abdominal cramps, nausea, vomiting, diarrhea.Denies having any black tarry stool. She reports she has never had GI bleeding or ulcers. She denies any fever, chills, cough, upper respiratory symptoms. She reports she does have urinary frequency and some mild dysuria and has had to UTIs in the last few months. She states that her PCP was sending  her to [facility] this morning to have a CT of her chest to rule out pulmonary embolism to make sure this was not a cause of her shortness of breath. CT was not performed as the facility did not have any labs back on the patient and was being rescheduled for tomorrow. She is being followed by oncology at [facility] for her thymoma. She underwent radiation and chemotherapy with the last treatment in December 2020. She is still followed by oncology. It is noted that she had a CT scan last year which showed a possible mass in part of her colon saw GI but no biopsy or colonoscopy was done at that time.  ED Course:In the emergency room patient is hemodynamically stable. She is Hemoccult negative on testing. She has hemoglobin of 7.2 with hematocrit of 23.3.She denies any chest pain or palpitations. Urinalysis is positive for UTI.  CT of her abdomen and pelvis to further evaluate mass from previous CT scan to make sure is not growing as ordered. Have also ordered CT angiography of her chest to rule out PE her symptoms and history of cancer  **Interim History Gastroenterology was consulted for further evaluation recommendations given that she is FOBT positive.  She continues to still be dyspneic but not require any supplemental oxygen.  GI was consulted and they are recommending an EGD and a colonoscopy to be done today.    EGD showed erosive gastropathy with no stigmata of recent bleeding and this was biopsied and this could be the source of her heme positive stool and may have contributed to her anemia.  GI recommends continuing pantoprazole 40 mg p.o. daily.  She also had  a 1 mm polyp in the ascending colon which was removed with a cold biopsy.  GI suspect that her anemia is multifactorial and the gastritis may have caused her heme positive stool and she had one area of inflammation in the proximal colon in the past which is now normal.  GI recommends awaiting pathology results and they will call  her after discharge and they do not recommend repeating a colonoscopy.  They also recommend checking iron studies as an outpatient.  She returned to the floor and PT evaluated her and they had no recommendations.  She tolerated diet without issues and she was deemed medically stable to be discharged at this time will need to follow-up with PCP, medical oncology and gastroenterology in outpatient setting and Dr. Carlean Purl will call her about the biopsy results and the polyp results in the future.   Discharge Diagnoses:  Principal Problem:   Anemia Active Problems:   HTN (hypertension)   History of breast cancer   Dyslipidemia   Thymoma -- managed by [facility] - extensive documentation in Care Everywhere   Dyspnea   Generalized weakness   UTI (urinary tract infection)   Symptomatic anemia   Heme + stool   Change in bowel habit   Erosive gastritis   Benign neoplasm of ascending colon  Multifactorial normocytic anemia -Placed on medical surgical floor for observation overnight however will be changed to inpatient given that she will likely need GI evaluation -Status post 1 unit of pRBC  -Patient's Hgb/Hct went from 7.6/23.2 -> 7.1/23.3 -> 7.9/25.5 after 1 unit of pRBC today is 9.0/20.5 -Patient has occult POSITIVE in the emergency room -Continue to Monitor for S/Sx of Bleeding -CT Abd/Pelvis showed "No acute intra-abdominal or pelvic pathology. A 2 cm cystic or pocket like outpouching from the gastric fundus, likely a gastric diverticulum or an enteric duplication cyst. Probable 2 cm left ovarian cyst. This is similar to prior CT and can be better evaluated with ultrasound on a nonemergent/outpatient basis. Aortic Atherosclerosis"  -GI consulted for further evaluation and recommendations; Appreciate their assistance and she is going to be going down for colonoscopy and EGD today; she underwent bowel prep yesterday and continues to have bowel movements but denies any blood  -GI recommends obtaining an  anemia panel in the outpatient given that she got blood here -EGD showed some gastritis which was biopsied and GI recommended starting the patient on Protonix 40 g p.o. daily and colonoscopy showed one colon polyp which was removed.  GI recommends following up in outpatient and is cleared for discharge given that she tolerated her diet without issues   Dyspnea -Patient with dyspnea which is worsened with exertion.  -Could be secondary to her anemia and will monitor with correction of anemia with packed red blood cell transfusion. -We will also check CT angiography of chest to rule out PE as etiology of dyspnea as patient does have increased risk with history of cancer and undergoing radiation and chemotherapy recently. -SpO2: 96 % -CTA of Chest showed "No pulmonary embolism. Left ventricular hypertrophy. Central pulmonary arterial enlargement suggests pulmonary artery hypertension and may relate to possible diastolic dysfunction.   Marked interval improvement in lobular anterior mediastinal mass abutting the as sending aorta. Aortic Atherosclerosis."  -She'll need an ambulatory home O2 screen prior to discharge and will get PT and OT to evaluate and she did not desaturate and she tolerated physical therapy without issues and did not require any oxygen  HTN (Hypertension) -Blood pressure stable and was  132/75 on last checkcontinue home medication of irbesartan 150 mg p.o. daily. -Continue to monitor blood pressures per protocol  -If necessary will add IV hydralazine  Dyslipidemia -Continue Atorvastatin 40 mg p.o. nightly  Generalized Weakness -Consult physical therapy for evaluation. Is contributing to the generalized weakness and should improve with improvement of hemoglobin level -Follow-up on PT recommendations and they have no further recommendations  Anxiety and Depression/Fibromyalgia -Continue with alprazolam 0.125 mg p.o. daily as needed anxiety as well as paroxetine 60 mg  p.o. every morning -Continue with gabapentin at discharge  UTI (urinary tract infection) -Urinalysis showed a cloudy appearance with large leukocytes, positive nitrites, many bacteria, present mucus, few squamous epithelial cells and too numerous to count WBCs -Unfortunately urine culture was never obtained and she was given a one-time dose of fosfomycin -Continue to monitor for urinary symptoms  Abnormal LFTs -The patient's AST is now 45 and ALT is 48 -Mildly elevated -We will need to continue to monitor and trend and repeat CMP within 1 week at PCP office -If necessary will obtain a right upper quadrant ultrasound as well as an acute hepatitis panel  Leukopenia -Patient's WBC went from 4.4 -> 4.2 -> 2.9 -> 3.5 -Continue to monitor and trend and repeat CBC in a.m.  Hyperglycemia -Patient's blood sugar ranging from 105-184 on daily CMP/BMPs -Check hemoglobin A1c and outpatient -Continue to monitor blood sugars carefully and if necessary will need to place on sensitive NovoLog/scale insulin AC  History of Breast Cancer History of Paget's disease of the breast Thymoma  -- managed by [facility] - extensive documentation in Care Everywhere within 1 week -In 1 to 2 weeks  Discharge Instructions  Discharge Instructions    Call MD for:  difficulty breathing, headache or visual disturbances   Complete by: As directed    Call MD for:  extreme fatigue   Complete by: As directed    Call MD for:  hives   Complete by: As directed    Call MD for:  persistant dizziness or light-headedness   Complete by: As directed    Call MD for:  persistant nausea and vomiting   Complete by: As directed    Call MD for:  redness, tenderness, or signs of infection (pain, swelling, redness, odor or green/yellow discharge around incision site)   Complete by: As directed    Call MD for:  severe uncontrolled pain   Complete by: As directed    Call MD for:  temperature >100.4   Complete by: As directed     Diet - low sodium heart healthy   Complete by: As directed    Discharge instructions   Complete by: As directed    You were cared for by a hospitalist during your hospital stay. If you have any questions about your discharge medications or the care you received while you were in the hospital after you are discharged, you can call the unit and ask to speak with the hospitalist on call if the hospitalist that took care of you is not available. Once you are discharged, your primary care physician will handle any further medical issues. Please note that NO REFILLS for any discharge medications will be authorized once you are discharged, as it is imperative that you return to your primary care physician (or establish a relationship with a primary care physician if you do not have one) for your aftercare needs so that they can reassess your need for medications and monitor your lab values.  Follow up with  PCP, Gastroenterology, and Oncology in the outpatient setting*. Take all medications as prescribed. If symptoms change or worsen please return to the ED for evaluation   Increase activity slowly   Complete by: As directed      Allergies as of 05/03/2020      Reactions   Latex Rash, Other (See Comments)   Blisters, also      Medication List    STOP taking these medications   butalbital-acetaminophen-caffeine 50-325-40 MG tablet Commonly known as: FIORICET   cycloSPORINE 0.05 % ophthalmic emulsion Commonly known as: RESTASIS   oxyCODONE 5 MG immediate release tablet Commonly known as: Oxy IR/ROXICODONE     TAKE these medications   ALPRAZolam 0.25 MG tablet Commonly known as: XANAX Take 0.5 tablets (0.125 mg total) by mouth daily as needed for anxiety.   aspirin EC 81 MG tablet Take 81 mg by mouth at bedtime.   atorvastatin 40 MG tablet Commonly known as: LIPITOR TAKE 1 TABLET BY MOUTH AT  BEDTIME   cyanocobalamin 1000 MCG/ML injection Commonly known as: (VITAMIN B-12) Inject 1 mL  (1,000 mcg total) into the muscle every 30 (thirty) days.   gabapentin 300 MG capsule Commonly known as: NEURONTIN Take 300 mg by mouth 2 (two) times daily.   irbesartan 150 MG tablet Commonly known as: AVAPRO TAKE 1 TABLET BY MOUTH  DAILY What changed: when to take this   ondansetron 8 MG tablet Commonly known as: ZOFRAN Take 8 mg by mouth every 8 (eight) hours as needed for nausea or vomiting.   pantoprazole 40 MG tablet Commonly known as: PROTONIX Take 1 tablet (40 mg total) by mouth daily before breakfast. Start taking on: May 04, 2020   PARoxetine 20 MG tablet Commonly known as: PAXIL TAKE 3 TABLETS BY MOUTH IN  THE MORNING What changed: See the new instructions.   polyethylene glycol powder 17 GM/SCOOP powder Commonly known as: GLYCOLAX/MIRALAX Take 17 g by mouth daily as needed for mild constipation.   SUMAtriptan 100 MG tablet Commonly known as: IMITREX Take 1 tablet (100 mg total) by mouth 2 (two) times daily as needed. At least 2 hrs between doses as needed bid What changed:   reasons to take this  additional instructions   SYRINGE-NEEDLE (DISP) 3 ML 25G X 1" 3 ML Misc Use to inject Vit B12 once a month.   valACYclovir 1000 MG tablet Commonly known as: VALTREX Take two tablets ( total 2000 mg) by mouth q12h x 1 day; Start: ASAP after symptom onset What changed:   how much to take  how to take this  when to take this  additional instructions   vitamin C 1000 MG tablet Take 1,000 mg by mouth daily.   VITAMIN D-3 PO Take 1 capsule by mouth daily.   vitamin E 180 MG (400 UNITS) capsule Generic drug: vitamin E Take 400 Units by mouth daily.       Follow-up Information    [name], [place]. Call.   Specialty: Physician Assistant Why: Follow up within 1-2 Weeks Contact information: [place] [address] [phone]        Gatha Mayer, MD. Call.   Specialty: Gastroenterology Why: Follow up within 1-2  weeks Contact information: [address] [address] [phone]              Allergies  Allergen Reactions  . Latex Rash and Other (See Comments)    Blisters, also    Consultations:  Gastroenterology  Procedures/Studies: DG Chest 2 View  Result Date: 05/01/2020 CLINICAL DATA:  Weakness, short of breath, history of left breast cancer EXAM: CHEST - 2 VIEW COMPARISON:  02/24/2019 FINDINGS: Frontal and lateral views of the chest demonstrate a stable cardiac silhouette. No airspace disease, effusion, or pneumothorax. Stable postsurgical changes from left mastectomy. No acute bony abnormalities. IMPRESSION: 1. No acute intrathoracic process. 2. The right hilar/anterior mediastinal mass seen on prior examination is not seen today. Electronically Signed   By: Randa Ngo M.D.   On: 05/01/2020 16:27   CT ANGIO CHEST PE W OR WO CONTRAST  Result Date: 05/02/2020 CLINICAL DATA:  Dyspnea on exertion, dizziness EXAM: CT ANGIOGRAPHY CHEST WITH CONTRAST TECHNIQUE: Multidetector CT imaging of the chest was performed using the standard protocol during bolus administration of intravenous contrast. Multiplanar CT image reconstructions and MIPs were obtained to evaluate the vascular anatomy. CONTRAST:  163m OMNIPAQUE IOHEXOL 350 MG/ML SOLN COMPARISON:  12/29/2018 FINDINGS: Cardiovascular: There is adequate opacification of the pulmonary arterial tree. No intraluminal filling defect is seen to suggest pulmonary embolism. The central pulmonary arteries are mildly enlarged suggesting changes of pulmonary arterial hypertension. There is left ventricular hypertrophy noted, however, global cardiac size is within normal limits. Moderate coronary artery calcification. Moderate calcification of the mitral valve annulus. No pericardial effusion. However, lobulated soft tissue noted on a prior examination has significantly decreased in size, though residual lobular soft tissue persists, abutting the  ascending aorta, measuring roughly 5.1 x 1.4 cm at axial image # 52/5. The thoracic aorta is otherwise age-appropriate with mild atherosclerotic calcification noted. Mediastinum/Nodes: Left mastectomy has been performed. No pathologic thoracic adenopathy identified. Lobulated soft tissue within the anterior mediastinum persists, as noted above. Small hiatal hernia present. Lungs/Pleura: Scarring within the right apex is unchanged. Lungs are otherwise clear. No pneumothorax or pleural effusion. Central airways are widely patent. Upper Abdomen: Small diverticulum arising from the gastric fundus is better assessed on prior examinations. Limited images of the upper abdomen are otherwise unremarkable. Musculoskeletal: No lytic or blastic bone lesions Review of the MIP images confirms the above findings. IMPRESSION: No pulmonary embolism. Left ventricular hypertrophy. Central pulmonary arterial enlargement suggests pulmonary artery hypertension and may relate to possible diastolic dysfunction. Marked interval improvement in lobular anterior mediastinal mass abutting the as sending aorta. Aortic Atherosclerosis (ICD10-I70.0). Electronically Signed   By: AFidela SalisburyMD   On: 05/02/2020 01:19   CT ABDOMEN PELVIS W CONTRAST  Result Date: 05/02/2020 CLINICAL DATA:  74year old female with concern for intra-abdominal mass. History of thymoma. EXAM: CT ABDOMEN AND PELVIS WITH CONTRAST TECHNIQUE: Multidetector CT imaging of the abdomen and pelvis was performed using the standard protocol following bolus administration of intravenous contrast. CONTRAST:  1045mOMNIPAQUE IOHEXOL 350 MG/ML SOLN COMPARISON:  CT abdomen pelvis dated 11/08/2019. FINDINGS: Lower chest: Please see report for accompanying chest CT. No intra-abdominal free air or free fluid. Hepatobiliary: No focal liver abnormality is seen. No gallstones, gallbladder wall thickening, or biliary dilatation. Pancreas: Unremarkable. No pancreatic ductal dilatation or  surrounding inflammatory changes. Spleen: Normal in size without focal abnormality. Adrenals/Urinary Tract: The adrenal glands are unremarkable. There is no hydronephrosis on either side. There is symmetric enhancement and excretion of contrast by both kidneys. There is a 1 cm left renal inferior pole cyst. Subcentimeter right renal hypodense lesion is too small to characterize. The visualized ureters and urinary bladder appear unremarkable. Stomach/Bowel: There is a 2 cm cystic or pocket like outpouching from the gastric fundus (105/5 and 59/6), likely  a gastric diverticulum or an enteric duplication cyst. There is no bowel obstruction or active inflammation. There is interposition of the proximal colon anterior to the right lobe of the liver. The appendix is not visualized with certainty. No inflammatory changes identified in the right lower quadrant. Vascular/Lymphatic: Moderate aortoiliac atherosclerotic disease. The IVC is unremarkable. No portal venous gas. There is no adenopathy. Reproductive: Hysterectomy. Probable 2 cm left ovarian cyst. This is similar to prior CT and can be better evaluated with ultrasound on a nonemergent/outpatient basis. Other: None Musculoskeletal: Osteopenia with degenerative changes of the spine. No acute osseous pathology. IMPRESSION: 1. No acute intra-abdominal or pelvic pathology. 2. A 2 cm cystic or pocket like outpouching from the gastric fundus, likely a gastric diverticulum or an enteric duplication cyst. 3. Probable 2 cm left ovarian cyst. This is similar to prior CT and can be better evaluated with ultrasound on a nonemergent/outpatient basis. 4. Aortic Atherosclerosis (ICD10-I70.0). Electronically Signed   By: Anner Crete M.D.   On: 05/02/2020 01:10    EGD Findings:      Multiple dispersed small erosions with no stigmata of recent bleeding       were found in the prepyloric region of the stomach. Biopsies were taken       with a cold forceps for histology.  Verification of patient       identification for the specimen was done. Estimated blood loss was       minimal.      The exam was otherwise without abnormality.      The cardia and gastric fundus were normal on retroflexion. Impression:               - Erosive gastropathy with no stigmata of recent                            bleeding. Biopsied. Certainly could be a source of                            heme + stool and may have contributed to anemia.                           - The examination was otherwise normal.  COLONOSCOPY Findings:      The perianal and digital rectal examinations were normal.      A 1 mm polyp was found in the ascending colon. The polyp was sessile.       The polyp was removed with a cold biopsy forceps. Resection and       retrieval were complete. Verification of patient identification for the       specimen was done. Estimated blood loss was minimal.      The terminal ileum appeared normal.      The exam was otherwise without abnormality on direct and retroflexion       views. Impression:               - One 1 mm polyp in the ascending colon, removed                            with a cold biopsy forceps. Resected and retrieved.                           -  The examined portion of the ileum was normal.                           - The examination was otherwise normal on direct                            and retroflexion views.                           - SUSPECT ANEMIA IS MULTIFACTORIAL - GASTRITIS MAY                            HAVE CAUSED HEME + STOOL - AREA OF PRIOR                            INFLAMMATION IN PROXIMAL COLON PON IMAGING IN PAST                            ALL NORMAL  Subjective: Seen and examined at bedside and think she is doing a little bit better.  Denies bleeding in her stool again and denied chest pain, lightheadedness or dizziness.  No nausea or vomiting.  Feels okay.  No other concerns or complaints at this time.  Underwent her EGD and  colonoscopy and was doing well after and is stable for discharge and will need to follow-up with PCP gastroenterology as well as medical oncology in outpatient setting and she understands agrees with plan of care.  Discharge Exam: Vitals:   05/03/20 1210 05/03/20 1227  BP: (!) 149/59 132/75  Pulse: 82 81  Resp: 14 16  Temp:  97.6 F (36.4 C)  SpO2: 96% 99%   Vitals:   05/03/20 1150 05/03/20 1155 05/03/20 1210 05/03/20 1227  BP: (!) 111/38 (!) 124/53 (!) 149/59 132/75  Pulse: 85 84 82 81  Resp: (!) 26 (!) 27 14 16   Temp: 98.3 F (36.8 C)   97.6 F (36.4 C)  TempSrc: Oral   Oral  SpO2: 95% 95% 96% 99%  Weight:      Height:       Examination from this AM Physical Exam:  Constitutional: WN/WD overweight Caucasian female currently no acute distress appears calm, NAD and appears calm  Eyes: Lids and conjunctivae normal, sclerae anicteric  ENMT: External Ears, Nose appear normal. Grossly normal hearing. Neck: Appears normal, supple, no cervical masses, normal ROM, no appreciable thyromegaly; no JVD Respiratory: Diminished to auscultation bilaterally, no wheezing, rales, rhonchi or crackles. Normal respiratory effort and patient is not tachypenic. No accessory muscle use. Unlabored breathing Cardiovascular: RRR, no murmurs / rubs / gallops. S1 and S2 auscultated. Trace Edema Abdomen: Soft, non-tender, mildly distended secondary body habitus. No masses palpated. No appreciable hepatosplenomegaly. Bowel sounds positive.  GU: Deferred. Musculoskeletal: No clubbing / cyanosis of digits/nails. No joint deformity upper and lower extremities.   Skin: No rashes, lesions, ulcers on limited skin evaluation. No induration; Warm and dry.  Neurologic: CN 2-12 grossly intact with no focal deficits. Romberg sign and cerebellar reflexes not assessed.  Psychiatric: Normal judgment and insight. Alert and oriented x 3. Normal mood and appropriate affect.    The results of significant diagnostics  from this hospitalization (including imaging, microbiology, ancillary and laboratory) are listed below for  reference.     Microbiology: Recent Results (from the past 240 hour(s))  SARS Coronavirus 2 by RT PCR (hospital order, performed in [facility] hospital lab) Nasopharyngeal Nasopharyngeal Swab     Status: None   Collection Time: 05/01/20 11:55 PM   Specimen: Nasopharyngeal Swab  Result Value Ref Range Status   SARS Coronavirus 2 NEGATIVE NEGATIVE Final    Comment: (NOTE) SARS-CoV-2 target nucleic acids are NOT DETECTED.  The SARS-CoV-2 RNA is generally detectable in upper and lower respiratory specimens during the acute phase of infection. The lowest concentration of SARS-CoV-2 viral copies this assay can detect is 250 copies / mL. A negative result does not preclude SARS-CoV-2 infection and should not be used as the sole basis for treatment or other patient management decisions.  A negative result may occur with improper specimen collection / handling, submission of specimen other than nasopharyngeal swab, presence of viral mutation(s) within the areas targeted by this assay, and inadequate number of viral copies (<250 copies / mL). A negative result must be combined with clinical observations, patient history, and epidemiological information.  Fact Sheet for Patients:   StrictlyIdeas.no  Fact Sheet for Healthcare Providers: BankingDealers.co.za  This test is not yet approved or  cleared by the [place] FDA and has been authorized for detection and/or diagnosis of SARS-CoV-2 by FDA under an Emergency Use Authorization (EUA).  This EUA will remain in effect (meaning this test can be used) for the duration of the COVID-19 declaration under Section 564(b)(1) of the Act, 21 U.S.C. section 360bbb-3(b)(1), unless the authorization is terminated or revoked sooner.  Performed at [facility], [place] [address],  [place], [place] [address]      Labs: BNP (last 3 results) No results for input(s): BNP in the last 8760 hours. Basic Metabolic Panel: Recent Labs  Lab 05/01/20 1405 05/01/20 1551 05/02/20 0500 05/03/20 0357  NA 136 137 135 136  K 4.7 4.1 4.4 4.6  CL 100 100 103 102  CO2 27 25 23 22   GLUCOSE 105* 184* 106* 125*  BUN 19 16 18 13   CREATININE 0.84 1.00 0.82 0.80  CALCIUM 9.7 9.3 9.0 9.4  MG  --   --   --  2.0  PHOS  --   --   --  4.4   Liver Function Tests: Recent Labs  Lab 05/01/20 1405 05/01/20 1551 05/03/20 0357  AST 45* 57* 45*  ALT 45* 50* 48*  ALKPHOS 116 106 114  BILITOT 0.4 0.2* 0.8  PROT 7.1 6.8 6.9  ALBUMIN 4.6 3.8 4.0   No results for input(s): LIPASE, AMYLASE in the last 168 hours. No results for input(s): AMMONIA in the last 168 hours. CBC: Recent Labs  Lab 05/01/20 1405 05/01/20 1551 05/02/20 0500 05/03/20 0357  WBC 4.4 4.2 2.9* 3.5*  NEUTROABS 3.1  --   --  2.6  HGB 7.6 Repeated and verified X2.* 7.1* 7.9* 9.0*  HCT 23.2 Repeated and verified X2.* 23.3* 25.5* 28.5*  MCV 87.1 90.7 89.2 89.1  PLT 245.0 235 190 205   Cardiac Enzymes: No results for input(s): CKTOTAL, CKMB, CKMBINDEX, TROPONINI in the last 168 hours. BNP: Invalid input(s): POCBNP CBG: No results for input(s): GLUCAP in the last 168 hours. D-Dimer No results for input(s): DDIMER in the last 72 hours. Hgb A1c No results for input(s): HGBA1C in the last 72 hours. Lipid Profile No results for input(s): CHOL, HDL, LDLCALC, TRIG, CHOLHDL, LDLDIRECT in the last 72 hours. Thyroid function studies  Recent Labs    05/01/20 1405  TSH 2.14   Anemia work up No results for input(s): VITAMINB12, FOLATE, FERRITIN, TIBC, IRON, RETICCTPCT in the last 72 hours. Urinalysis    Component Value Date/Time   COLORURINE YELLOW 05/01/2020 1405   APPEARANCEUR Cloudy (A) 05/01/2020 1405   LABSPEC 1.010 05/01/2020 1405   PHURINE 6.5 05/01/2020 1405   GLUCOSEU NEGATIVE 05/01/2020 1405   HGBUR  NEGATIVE 05/01/2020 1405   BILIRUBINUR NEGATIVE 05/01/2020 1405   BILIRUBINUR 1+ 03/05/2020 1410   KETONESUR NEGATIVE 05/01/2020 1405   PROTEINUR Positive (A) 03/05/2020 1410   PROTEINUR NEGATIVE 11/08/2019 0028   UROBILINOGEN 1.0 05/01/2020 1405   NITRITE POSITIVE (A) 05/01/2020 1405   LEUKOCYTESUR LARGE (A) 05/01/2020 1405   Sepsis Labs Invalid input(s): PROCALCITONIN,  WBC,  LACTICIDVEN Microbiology Recent Results (from the past 240 hour(s))  SARS Coronavirus 2 by RT PCR (hospital order, performed in [place] hospital lab) Nasopharyngeal Nasopharyngeal Swab     Status: None   Collection Time: 05/01/20 11:55 PM   Specimen: Nasopharyngeal Swab  Result Value Ref Range Status   SARS Coronavirus 2 NEGATIVE NEGATIVE Final    Comment: (NOTE) SARS-CoV-2 target nucleic acids are NOT DETECTED.  The SARS-CoV-2 RNA is generally detectable in upper and lower respiratory specimens during the acute phase of infection. The lowest concentration of SARS-CoV-2 viral copies this assay can detect is 250 copies / mL. A negative result does not preclude SARS-CoV-2 infection and should not be used as the sole basis for treatment or other patient management decisions.  A negative result may occur with improper specimen collection / handling, submission of specimen other than nasopharyngeal swab, presence of viral mutation(s) within the areas targeted by this assay, and inadequate number of viral copies (<250 copies / mL). A negative result must be combined with clinical observations, patient history, and epidemiological information.  Fact Sheet for Patients:   StrictlyIdeas.no  Fact Sheet for Healthcare Providers: BankingDealers.co.za  This test is not yet approved or  cleared by the [place] FDA and has been authorized for detection and/or diagnosis of SARS-CoV-2 by FDA under an Emergency Use Authorization (EUA).  This EUA will remain in  effect (meaning this test can be used) for the duration of the COVID-19 declaration under Section 564(b)(1) of the Act, 21 U.S.C. section 360bbb-3(b)(1), unless the authorization is terminated or revoked sooner.  Performed at [facility], [place] [address] [place], [place] [address]    Time coordinating discharge: 35 minutes  SIGNED:  Kerney Elbe, DO [facility] 05/03/2020, 2:41 PM Pager is on [place]  If 7PM-7AM, please contact night-coverage [email or web address]

## 2020-05-03 NOTE — Progress Notes (Signed)
PROGRESS NOTE    Autumn Conley  AQT:[id number] DOB: [birth year] DOA: 05/01/2020 PCP: Autumn Coke, PA   Brief Narrative:  HPI per Dr. Harrold Donath on 05/01/20  Autumn Conley is a 74 y.o. female with medical history significant for thyoma being followed by Oncology, Hx of Pagets disease of breast, HTN, HLD, anxiety, IBS, NASH, fibromyalgia who presents for evaluation of generalized weakness, dizziness, shortness of breath with exertion.  Patient saw her PCP this morning and was called in the afternoon and told that her hemoglobin and hematocrit levels were decreased and she needed to come to the emergency room for evaluation.  She reports for the last few weeks she has had a low energy level and when she stands up she becomes dizzy and "woozy".  She has not had any loss of consciousness.  Reports she has fallen a few times over the last few weeks secondary to being weak and feeling lightheaded but did not have any injury.  She states she has not had any abdominal pain, abdominal cramps, nausea, vomiting, diarrhea.  Denies having any black tarry stool.  She reports she has never had GI bleeding or ulcers.  She denies any fever, chills, cough, upper respiratory symptoms.  She reports she does have urinary frequency and some mild dysuria and has had to UTIs in the last few months.  She states that her PCP was sending her to [facility] this morning to have a CT of her chest to rule out pulmonary embolism to make sure this was not a cause of her shortness of breath.  CT was not performed as the facility did not have any labs back on the patient and was being rescheduled for tomorrow. She is being followed by oncology at [facility] for her thymoma.  She underwent radiation and chemotherapy with the last treatment in December 2020.  She is still followed by oncology.  It is noted that she had a CT scan last year which showed a possible mass in part of her colon saw GI but no biopsy or colonoscopy was done at  that time.  ED Course: In the emergency room patient is hemodynamically stable.  She is Hemoccult negative on testing.  She has hemoglobin of 7.2 with hematocrit of 23.3.  She denies any chest pain or palpitations.  Urinalysis is positive for UTI.  CT of her abdomen and pelvis to further evaluate mass from previous CT scan to make sure is not growing as ordered.  Have also ordered CT angiography of her chest to rule out PE her symptoms and history of cancer  **Interim History Gastroenterology was consulted for further evaluation recommendations given that she is FOBT positive.  She continues to still be dyspneic but not require any supplemental oxygen.  GI was consulted and they are recommending an EGD and a colonoscopy to be done today.  Currently she is going down for the  Assessment & Plan:   Principal Problem:   Anemia Active Problems:   HTN (hypertension)   History of breast cancer   Dyslipidemia   Thymoma -- managed by [facility] - extensive documentation in Care Everywhere   Dyspnea   Generalized weakness   UTI (urinary tract infection)   Symptomatic anemia   Heme + stool   Change in bowel habit  Symptomatic Anemia -Placed on medical surgical floor for observation overnight however will be changed to inpatient given that she will likely need GI evaluation -Status post 1 unit of pRBC  -Patient's Hgb/Hct went  from 7.6/23.2 -> 7.1/23.3 -> 7.9/25.5 after 1 unit of pRBC today is 9.0/20.5 -Patient has occult POSITIVE in the emergency room -Continue to Monitor for S/Sx of Bleeding -CT Abd/Pelvis showed "No acute intra-abdominal or pelvic pathology. A 2 cm cystic or pocket like outpouching from the gastric fundus, likely a gastric diverticulum or an enteric duplication cyst. Probable 2 cm left ovarian cyst. This is similar to prior CT and can be better evaluated with ultrasound on a nonemergent/outpatient basis. Aortic Atherosclerosis"  -GI consulted for further evaluation and  recommendations; Appreciate their assistance and she is going to be going down for colonoscopy and EGD today; she underwent bowel prep yesterday and continues to have bowel movements but denies any blood -We will see what the colonoscopy and the EGD show  Dyspnea -Patient with dyspnea which is worsened with exertion.   -Could be secondary to her anemia and will monitor with correction of anemia with packed red blood cell transfusion. -We will also check CT angiography of chest to rule out PE as etiology of dyspnea as patient does have increased risk with history of cancer and undergoing radiation and chemotherapy recently. -SpO2: 96 % -CTA of Chest showed "No pulmonary embolism. Left ventricular hypertrophy. Central pulmonary arterial enlargement suggests pulmonary artery hypertension and may relate to possible diastolic dysfunction.   Marked interval improvement in lobular anterior mediastinal mass abutting the as sending aorta. Aortic Atherosclerosis."  -She'll need an ambulatory home O2 screen prior to discharge and will get PT and OT to evaluate  HTN (Hypertension) -Blood pressure stable and a little elevated at 150/64 on last check.  Continue home medication of irbesartan 150 mg p.o. daily. -Continue to monitor blood pressures per protocol  -If necessary will add IV hydralazine  Dyslipidemia -Continue Atorvastatin 40 mg p.o. nightly  Generalized Weakness -Consult physical therapy for evaluation.  Is contributing to the generalized weakness and should improve with improvement of hemoglobin level -Follow-up on PT recommendations  Anxiety and Depression/Fibromyalgia -Continue with alprazolam 0.125 mg p.o. daily as needed anxiety as well as paroxetine 60 mg p.o. every morning -Continue with gabapentin   UTI (urinary tract infection) -Urinalysis showed a cloudy appearance with large leukocytes, positive nitrites, many bacteria, present mucus, few squamous epithelial cells and too  numerous to count WBCs -Unfortunately urine culture was never obtained and she was given a one-time dose of fosfomycin -Continue to monitor for urinary symptoms  Abnormal LFTs -The patient's AST is now 45 and ALT is 48 -Mildly elevated -We will need to continue to monitor and trend and repeat CMP in a.m.  -If necessary will obtain a right upper quadrant ultrasound as well as an acute hepatitis panel  Leukopenia -Patient's WBC went from 4.4 -> 4.2 -> 2.9 -> 3.5 -Continue to monitor and trend and repeat CBC in a.m.  Hyperglycemia -Patient's blood sugar ranging from 105-184 on daily CMP/BMPs -Check hemoglobin A1c in the a.m. -Continue to monitor blood sugars carefully and if necessary will need to place on sensitive NovoLog/scale insulin AC  History of Breast Cancer History of Paget's disease of the breast Thymoma  -- managed by [facility] - extensive documentation in Care Everywhere    DVT prophylaxis: Was started on enoxaparin 40 mg every 24 hours.  Given concern for GI bleeding and for further GI work-up Code Status: FULL CODE Family Communication: Discussed with family at bedside Disposition Plan: Pending further evaluation by Gastroenterology and clearance  Status is: Inpatient  Remains inpatient appropriate because:Ongoing diagnostic testing needed not appropriate  for outpatient work up, Unsafe d/c plan and Inpatient level of care appropriate due to severity of illness   Dispo: The patient is from: Home              Anticipated d/c is to: TBD              Anticipated d/c date is: 1 day              Patient currently is not medically stable to d/c.  Consultants:   Gastroenterology    Procedures: None  Antimicrobials:  Anti-infectives (From admission, onward)   Start     Dose/Rate Route Frequency Ordered Stop   05/02/20 0000  fosfomycin (MONUROL) packet 3 g        3 g Oral  Once 05/01/20 2331 05/02/20 0043     Subjective: Seen and examined at bedside and think she  is doing a little bit better.  Denies bleeding in her stool again and denied chest pain, lightheadedness or dizziness.  No nausea or vomiting.  Feels okay.  No other concerns or complaints at this time.  Objective: Vitals:   05/02/20 1345 05/02/20 2126 05/03/20 0500 05/03/20 1000  BP:  (!) 142/64 123/70 (!) 150/64  Pulse:  95 99   Resp:  17 17 15   Temp: 98.2 F (36.8 C) 97.9 F (36.6 C) 98.5 F (36.9 C) 98.3 F (36.8 C)  TempSrc: Axillary Oral Oral Oral  SpO2:  96% 96% 96%  Weight:    75.9 kg  Height:    5' 3"  (1.6 m)    Intake/Output Summary (Last 24 hours) at 05/03/2020 1025 Last data filed at 05/03/2020 0912 Gross per 24 hour  Intake 0 ml  Output --  Net 0 ml   Filed Weights   05/03/20 1000  Weight: 75.9 kg    Examination: Physical Exam:  Constitutional: WN/WD overweight Caucasian female currently no acute distress appears calm, NAD and appears calm  Eyes: Lids and conjunctivae normal, sclerae anicteric  ENMT: External Ears, Nose appear normal. Grossly normal hearing. Neck: Appears normal, supple, no cervical masses, normal ROM, no appreciable thyromegaly; no JVD Respiratory: Diminished to auscultation bilaterally, no wheezing, rales, rhonchi or crackles. Normal respiratory effort and patient is not tachypenic. No accessory muscle use. Unlabored breathing Cardiovascular: RRR, no murmurs / rubs / gallops. S1 and S2 auscultated. Trace Edema Abdomen: Soft, non-tender, mildly distended secondary body habitus. No masses palpated. No appreciable hepatosplenomegaly. Bowel sounds positive.  GU: Deferred. Musculoskeletal: No clubbing / cyanosis of digits/nails. No joint deformity upper and lower extremities.   Skin: No rashes, lesions, ulcers on limited skin evaluation. No induration; Warm and dry.  Neurologic: CN 2-12 grossly intact with no focal deficits. Romberg sign and cerebellar reflexes not assessed.  Psychiatric: Normal judgment and insight. Alert and oriented x 3. Normal  mood and appropriate affect.    Data Reviewed: I have personally reviewed following labs and imaging studies  CBC: Recent Labs  Lab 05/01/20 1405 05/01/20 1551 05/02/20 0500 05/03/20 0357  WBC 4.4 4.2 2.9* 3.5*  NEUTROABS 3.1  --   --  2.6  HGB 7.6 Repeated and verified X2.* 7.1* 7.9* 9.0*  HCT 23.2 Repeated and verified X2.* 23.3* 25.5* 28.5*  MCV 87.1 90.7 89.2 89.1  PLT 245.0 235 190 814   Basic Metabolic Panel: Recent Labs  Lab 05/01/20 1405 05/01/20 1551 05/02/20 0500 05/03/20 0357  NA 136 137 135 136  K 4.7 4.1 4.4 4.6  CL 100 100 103  102  CO2 27 25 23 22   GLUCOSE 105* 184* 106* 125*  BUN 19 16 18 13   CREATININE 0.84 1.00 0.82 0.80  CALCIUM 9.7 9.3 9.0 9.4  MG  --   --   --  2.0  PHOS  --   --   --  4.4   GFR: Estimated Creatinine Clearance: 60.2 mL/min (by C-G formula based on SCr of 0.8 mg/dL). Liver Function Tests: Recent Labs  Lab 05/01/20 1405 05/01/20 1551 05/03/20 0357  AST 45* 57* 45*  ALT 45* 50* 48*  ALKPHOS 116 106 114  BILITOT 0.4 0.2* 0.8  PROT 7.1 6.8 6.9  ALBUMIN 4.6 3.8 4.0   No results for input(s): LIPASE, AMYLASE in the last 168 hours. No results for input(s): AMMONIA in the last 168 hours. Coagulation Profile: No results for input(s): INR, PROTIME in the last 168 hours. Cardiac Enzymes: No results for input(s): CKTOTAL, CKMB, CKMBINDEX, TROPONINI in the last 168 hours. BNP (last 3 results) No results for input(s): PROBNP in the last 8760 hours. HbA1C: No results for input(s): HGBA1C in the last 72 hours. CBG: No results for input(s): GLUCAP in the last 168 hours. Lipid Profile: No results for input(s): CHOL, HDL, LDLCALC, TRIG, CHOLHDL, LDLDIRECT in the last 72 hours. Thyroid Function Tests: Recent Labs    05/01/20 1405  TSH 2.14   Anemia Panel: No results for input(s): VITAMINB12, FOLATE, FERRITIN, TIBC, IRON, RETICCTPCT in the last 72 hours. Sepsis Labs: No results for input(s): PROCALCITON, LATICACIDVEN in the  last 168 hours.  Recent Results (from the past 240 hour(s))  SARS Coronavirus 2 by RT PCR (hospital order, performed in [facility] hospital lab) Nasopharyngeal Nasopharyngeal Swab     Status: None   Collection Time: 05/01/20 11:55 PM   Specimen: Nasopharyngeal Swab  Result Value Ref Range Status   SARS Coronavirus 2 NEGATIVE NEGATIVE Final    Comment: (NOTE) SARS-CoV-2 target nucleic acids are NOT DETECTED.  The SARS-CoV-2 RNA is generally detectable in upper and lower respiratory specimens during the acute phase of infection. The lowest concentration of SARS-CoV-2 viral copies this assay can detect is 250 copies / mL. A negative result does not preclude SARS-CoV-2 infection and should not be used as the sole basis for treatment or other patient management decisions.  A negative result may occur with improper specimen collection / handling, submission of specimen other than nasopharyngeal swab, presence of viral mutation(s) within the areas targeted by this assay, and inadequate number of viral copies (<250 copies / mL). A negative result must be combined with clinical observations, patient history, and epidemiological information.  Fact Sheet for Patients:   StrictlyIdeas.no  Fact Sheet for Healthcare Providers: BankingDealers.co.za  This test is not yet approved or  cleared by the [place] FDA and has been authorized for detection and/or diagnosis of SARS-CoV-2 by FDA under an Emergency Use Authorization (EUA).  This EUA will remain in effect (meaning this test can be used) for the duration of the COVID-19 declaration under Section 564(b)(1) of the Act, 21 U.S.C. section 360bbb-3(b)(1), unless the authorization is terminated or revoked sooner.  Performed at [facility], [place] [address], [place], [place] [address]      RN Pressure Injury Documentation:     Estimated body mass index is 29.65 kg/m as calculated  from the following:   Height as of this encounter: 5' 3"  (1.6 m).   Weight as of this encounter: 75.9 kg.  Malnutrition Type:  Malnutrition Characteristics:      Nutrition Interventions:    Radiology Studies: DG Chest 2 View  Result Date: 05/01/2020 CLINICAL DATA:  Weakness, short of breath, history of left breast cancer EXAM: CHEST - 2 VIEW COMPARISON:  02/24/2019 FINDINGS: Frontal and lateral views of the chest demonstrate a stable cardiac silhouette. No airspace disease, effusion, or pneumothorax. Stable postsurgical changes from left mastectomy. No acute bony abnormalities. IMPRESSION: 1. No acute intrathoracic process. 2. The right hilar/anterior mediastinal mass seen on prior examination is not seen today. Electronically Signed   By: Randa Ngo M.D.   On: 05/01/2020 16:27   CT ANGIO CHEST PE W OR WO CONTRAST  Result Date: 05/02/2020 CLINICAL DATA:  Dyspnea on exertion, dizziness EXAM: CT ANGIOGRAPHY CHEST WITH CONTRAST TECHNIQUE: Multidetector CT imaging of the chest was performed using the standard protocol during bolus administration of intravenous contrast. Multiplanar CT image reconstructions and MIPs were obtained to evaluate the vascular anatomy. CONTRAST:  17m OMNIPAQUE IOHEXOL 350 MG/ML SOLN COMPARISON:  12/29/2018 FINDINGS: Cardiovascular: There is adequate opacification of the pulmonary arterial tree. No intraluminal filling defect is seen to suggest pulmonary embolism. The central pulmonary arteries are mildly enlarged suggesting changes of pulmonary arterial hypertension. There is left ventricular hypertrophy noted, however, global cardiac size is within normal limits. Moderate coronary artery calcification. Moderate calcification of the mitral valve annulus. No pericardial effusion. However, lobulated soft tissue noted on a prior examination has significantly decreased in size, though residual lobular soft tissue persists, abutting the ascending aorta, measuring  roughly 5.1 x 1.4 cm at axial image # 52/5. The thoracic aorta is otherwise age-appropriate with mild atherosclerotic calcification noted. Mediastinum/Nodes: Left mastectomy has been performed. No pathologic thoracic adenopathy identified. Lobulated soft tissue within the anterior mediastinum persists, as noted above. Small hiatal hernia present. Lungs/Pleura: Scarring within the right apex is unchanged. Lungs are otherwise clear. No pneumothorax or pleural effusion. Central airways are widely patent. Upper Abdomen: Small diverticulum arising from the gastric fundus is better assessed on prior examinations. Limited images of the upper abdomen are otherwise unremarkable. Musculoskeletal: No lytic or blastic bone lesions Review of the MIP images confirms the above findings. IMPRESSION: No pulmonary embolism. Left ventricular hypertrophy. Central pulmonary arterial enlargement suggests pulmonary artery hypertension and may relate to possible diastolic dysfunction. Marked interval improvement in lobular anterior mediastinal mass abutting the as sending aorta. Aortic Atherosclerosis (ICD10-I70.0). Electronically Signed   By: AFidela SalisburyMD   On: 05/02/2020 01:19   CT ABDOMEN PELVIS W CONTRAST  Result Date: 05/02/2020 CLINICAL DATA:  74year old female with concern for intra-abdominal mass. History of thymoma. EXAM: CT ABDOMEN AND PELVIS WITH CONTRAST TECHNIQUE: Multidetector CT imaging of the abdomen and pelvis was performed using the standard protocol following bolus administration of intravenous contrast. CONTRAST:  1073mOMNIPAQUE IOHEXOL 350 MG/ML SOLN COMPARISON:  CT abdomen pelvis dated 11/08/2019. FINDINGS: Lower chest: Please see report for accompanying chest CT. No intra-abdominal free air or free fluid. Hepatobiliary: No focal liver abnormality is seen. No gallstones, gallbladder wall thickening, or biliary dilatation. Pancreas: Unremarkable. No pancreatic ductal dilatation or surrounding inflammatory  changes. Spleen: Normal in size without focal abnormality. Adrenals/Urinary Tract: The adrenal glands are unremarkable. There is no hydronephrosis on either side. There is symmetric enhancement and excretion of contrast by both kidneys. There is a 1 cm left renal inferior pole cyst. Subcentimeter right renal hypodense lesion is too small to characterize. The visualized ureters and urinary bladder appear unremarkable. Stomach/Bowel: There is a 2 cm  cystic or pocket like outpouching from the gastric fundus (105/5 and 59/6), likely a gastric diverticulum or an enteric duplication cyst. There is no bowel obstruction or active inflammation. There is interposition of the proximal colon anterior to the right lobe of the liver. The appendix is not visualized with certainty. No inflammatory changes identified in the right lower quadrant. Vascular/Lymphatic: Moderate aortoiliac atherosclerotic disease. The IVC is unremarkable. No portal venous gas. There is no adenopathy. Reproductive: Hysterectomy. Probable 2 cm left ovarian cyst. This is similar to prior CT and can be better evaluated with ultrasound on a nonemergent/outpatient basis. Other: None Musculoskeletal: Osteopenia with degenerative changes of the spine. No acute osseous pathology. IMPRESSION: 1. No acute intra-abdominal or pelvic pathology. 2. A 2 cm cystic or pocket like outpouching from the gastric fundus, likely a gastric diverticulum or an enteric duplication cyst. 3. Probable 2 cm left ovarian cyst. This is similar to prior CT and can be better evaluated with ultrasound on a nonemergent/outpatient basis. 4. Aortic Atherosclerosis (ICD10-I70.0). Electronically Signed   By: Anner Crete M.D.   On: 05/02/2020 01:10   Scheduled Meds: . [MAR Hold] aspirin EC  81 mg Oral QHS  . [MAR Hold] atorvastatin  40 mg Oral QHS  . [MAR Hold] gabapentin  300 mg Oral BID  . [MAR Hold] irbesartan  150 mg Oral q AM  . [MAR Hold] PARoxetine  60 mg Oral q AM    Continuous Infusions:   LOS: 1 day   Kerney Elbe, DO [facility] PAGER is on [place]  If 7PM-7AM, please contact night-coverage [email or web address]

## 2020-05-03 NOTE — Op Note (Signed)
[facility] Patient Name: Autumn Conley Procedure Date : 05/03/2020 MRN: [id number] Attending MD: Gatha Mayer , MD Date of Birth: 03-03-1946 CSN: [id number] Age: 74 Admit Type: Inpatient Procedure:                Upper GI endoscopy Indications:              Heme positive stool, Anemia Providers:                Gatha Mayer, MD, Doristine Johns, RN, Elspeth Cho Tech., Technician, Cira Servant, CRNA Referring MD:              Medicines:                Propofol per Anesthesia, Monitored Anesthesia Care Complications:            No immediate complications. Estimated Blood Loss:     Estimated blood loss was minimal. Procedure:                Pre-Anesthesia Assessment:                           - Prior to the procedure, a History and Physical                            was performed, and patient medications and                            allergies were reviewed. The patient's tolerance of                            previous anesthesia was also reviewed. The risks                            and benefits of the procedure and the sedation                            options and risks were discussed with the patient.                            All questions were answered, and informed consent                            was obtained. Prior Anticoagulants: The patient has                            taken no previous anticoagulant or antiplatelet                            agents. ASA Grade Assessment: III - A patient with                            severe systemic disease. After reviewing the risks  and benefits, the patient was deemed in                            satisfactory condition to undergo the procedure.                           After obtaining informed consent, the endoscope was                            passed under direct vision. Throughout the                            procedure, the patient's blood pressure,  pulse, and                            oxygen saturations were monitored continuously. The                            [id number]-H190 ([id number] Olympus gastroscope was                            introduced through the mouth, and advanced to the                            second part of duodenum. The upper GI endoscopy was                            accomplished without difficulty. The patient                            tolerated the procedure well. Scope In: Scope Out: Findings:      Multiple dispersed small erosions with no stigmata of recent bleeding       were found in the prepyloric region of the stomach. Biopsies were taken       with a cold forceps for histology. Verification of patient       identification for the specimen was done. Estimated blood loss was       minimal.      The exam was otherwise without abnormality.      The cardia and gastric fundus were normal on retroflexion. Impression:               - Erosive gastropathy with no stigmata of recent                            bleeding. Biopsied. Certainly could be a source of                            heme + stool and may have contributed to anemia.                           - The examination was otherwise normal. Recommendation:           - Resume previous diet.                           -  Continue present medications.                           - Use Protonix (pantoprazole) 40 mg PO daily. OK to                            continue 81 mg ASA if thought necessary                           - Await pathology results. I will f/u and contact                            her after dc                           - See the other procedure note for documentation of                            additional recommendations. Procedure Code(s):        --- Professional ---                           [phone], Esophagogastroduodenoscopy, flexible,                            transoral; with biopsy, single or multiple Diagnosis Code(s):        ---  Professional ---                           K31.89, Other diseases of stomach and duodenum                           R19.5, Other fecal abnormalities                           D64.9, Anemia, unspecified CPT copyright 2019 American Medical Association. All rights reserved. The codes documented in this report are preliminary and upon coder review may  be revised to meet current compliance requirements. Gatha Mayer, MD 05/03/2020 11:54:43 AM This report has been signed electronically. Number of Addenda: 0

## 2020-05-03 NOTE — Progress Notes (Signed)
Patient discharged to home. Verbalizes understanding of all discharge instructions including discharge medications and follow up MD visits. Patient's daughter present for discharge instructions.

## 2020-05-03 NOTE — Plan of Care (Signed)
  Problem: Education: Goal: Knowledge of General Education information will improve Description Including pain rating scale, medication(s)/side effects and non-pharmacologic comfort measures Outcome: Progressing   

## 2020-05-03 NOTE — Evaluation (Signed)
Occupational Therapy Evaluation  Patient Details Name: Autumn Conley MRN: [id number] DOB: 12-28-1945 Today's Date: 05/03/2020    History of Present Illness 74 y.o. female with medical history significant for thyoma being followed by Oncology, Hx of Pagets disease of breast, HTN, HLD, anxiety, IBS, NASH, fibromyalgia who presents for evaluation of generalized weakness, dizziness, shortness of breath with exertion. Pt was found to have low Hgb and hematocrit levels. Pt underwent colonoscopy and upper GI endoscopy which demonstrated Erosive gastropathy with no stigmata of recent bleeding.   Clinical Impression   Pt is functioning modified independently to independently in ADL and mobility. Educated in energy conservation strategies and reinforced with written handout. Encouraged pt to use her shower seat as needed. Pt verbalized understanding. She has excellent family support at home. No further OT needs.    Follow Up Recommendations  No OT follow up    Equipment Recommendations  None recommended by OT    Recommendations for Other Services       Precautions / Restrictions Precautions Precautions: None Restrictions Weight Bearing Restrictions: No      Mobility Bed Mobility               General bed mobility comments: pt received and left in recliner  Transfers Overall transfer level: Independent Equipment used: None             General transfer comment: pt completes sit to stand independently    Balance Overall balance assessment: Mild deficits observed, not formally tested                                         ADL either performed or assessed with clinical judgement   ADL Overall ADL's : Independent                                       General ADL Comments: Educated pt and daughter in energy conservation strategies and provided written handout.     Vision Baseline Vision/History: Wears glasses Wears Glasses: At all  times       Perception     Praxis      Pertinent Vitals/Pain Pain Assessment: No/denies pain     Hand Dominance Right   Extremity/Trunk Assessment Upper Extremity Assessment Upper Extremity Assessment: Overall WFL for tasks assessed   Lower Extremity Assessment Lower Extremity Assessment: Defer to PT evaluation   Cervical / Trunk Assessment Cervical / Trunk Assessment: Normal   Communication Communication Communication: No difficulties   Cognition Arousal/Alertness: Awake/alert Behavior During Therapy: WFL for tasks assessed/performed Overall Cognitive Status: Within Functional Limits for tasks assessed                                     General Comments      Exercises     Shoulder Instructions      Home Living Family/patient expects to be discharged to:: Private residence Living Arrangements: Spouse/significant other Available Help at Discharge: Family;Available 24 hours/day Type of Home: House Home Access: Stairs to enter [employer] of Steps: 3 Entrance Stairs-Rails: Left Home Layout: One level     Bathroom Shower/Tub: Occupational psychologist: Handicapped height Bathroom Accessibility: No   Home Equipment: Civil engineer, contracting  Prior Functioning/Environment Level of Independence: Independent        Comments: husband and daughter assist as needed due to fatigue, no anticipated cancer treatment in the near future        OT Problem List:        OT Treatment/Interventions:      OT Goals(Current goals can be found in the care plan section) Acute Rehab OT Goals Patient Stated Goal: return home to see her cat  OT Frequency:     Barriers to D/C:            Co-evaluation              AM-PAC OT "6 Clicks" Daily Activity     Outcome Measure Help from another person eating meals?: None Help from another person taking care of personal grooming?: None Help from another person toileting, which  includes using toliet, bedpan, or urinal?: None Help from another person bathing (including washing, rinsing, drying)?: None Help from another person to put on and taking off regular upper body clothing?: None Help from another person to put on and taking off regular lower body clothing?: None 6 Click Score: 24   End of Session    Activity Tolerance: Patient tolerated treatment well Patient left: in chair;with call bell/phone within reach;with family/visitor present  OT Visit Diagnosis: Other (comment) (decreased activity tolerance)                Time: 1444-1500 OT Time Calculation (min): 16 min Charges:  OT General Charges $OT Visit: 1 Visit OT Evaluation $OT Eval Low Complexity: 1 Low  Nestor Lewandowsky, OTR/L Acute Rehabilitation Services Pager: [phone] Office: [phone]  Autumn Conley 05/03/2020, 3:19 PM

## 2020-05-03 NOTE — Anesthesia Postprocedure Evaluation (Signed)
Anesthesia Post Note  Patient: Autumn Conley  Procedure(s) Performed: COLONOSCOPY WITH PROPOFOL (N/A ) ESOPHAGOGASTRODUODENOSCOPY (EGD) WITH PROPOFOL (N/A ) BIOPSY POLYPECTOMY     Patient location during evaluation: Endoscopy Anesthesia Type: MAC Level of consciousness: awake and alert Pain management: pain level controlled Vital Signs Assessment: post-procedure vital signs reviewed and stable Respiratory status: spontaneous breathing, nonlabored ventilation, respiratory function stable and patient connected to nasal cannula oxygen Cardiovascular status: blood pressure returned to baseline and stable Postop Assessment: no apparent nausea or vomiting Anesthetic complications: no   No complications documented.  Last Vitals:  Vitals:   05/03/20 1210 05/03/20 1227  BP: (!) 149/59 132/75  Pulse: 82 81  Resp: 14 16  Temp:  36.4 C  SpO2: 96% 99%    Last Pain:  Vitals:   05/03/20 1227  TempSrc: Oral  PainSc:                  [place]et [place]

## 2020-05-03 NOTE — Transfer of Care (Signed)
Immediate Anesthesia Transfer of Care Note  Patient: Autumn Conley  Procedure(s) Performed: COLONOSCOPY WITH PROPOFOL (N/A ) ESOPHAGOGASTRODUODENOSCOPY (EGD) WITH PROPOFOL (N/A ) BIOPSY POLYPECTOMY  Patient Location: PACU Endo  Anesthesia Type:MAC  Level of Consciousness: drowsy and patient cooperative  Airway & Oxygen Therapy: Patient Spontanous Breathing and Patient connected to nasal cannula oxygen  Post-op Assessment: Report given to RN and Post -op Vital signs reviewed and stable  Post vital signs: Reviewed and stable  Last Vitals:  Vitals Value Taken Time  BP 111/38 05/03/20 1147  Temp    Pulse 83 05/03/20 1148  Resp 24 05/03/20 1148  SpO2 95 % 05/03/20 1148  Vitals shown include unvalidated device data.  Last Pain:  Vitals:   05/03/20 1000  TempSrc: Oral  PainSc: 0-No pain      Patients Stated Pain Goal: 2 (38/46/65 9935)  Complications: No complications documented.

## 2020-05-03 NOTE — Op Note (Signed)
[facility] Patient Name: Autumn Conley Procedure Date : 05/03/2020 MRN: [id number] Attending MD: Gatha Mayer , MD Date of Birth: [birth year] CSN: [id number] Age: 74 Admit Type: Inpatient Procedure:                Colonoscopy Indications:              Heme positive stool, Anemia, Change in bowel habits Providers:                Gatha Mayer, MD, [name], RN, Theodora Blow,                            Technician, Cira Servant, CRNA Referring MD:              Medicines:                Propofol per Anesthesia, Monitored Anesthesia Care Complications:            No immediate complications. Estimated Blood Loss:     Estimated blood loss was minimal. Procedure:                Pre-Anesthesia Assessment:                           - Prior to the procedure, a History and Physical                            was performed, and patient medications and                            allergies were reviewed. The patient's tolerance of                            previous anesthesia was also reviewed. The risks                            and benefits of the procedure and the sedation                            options and risks were discussed with the patient.                            All questions were answered, and informed consent                            was obtained. Prior Anticoagulants: The patient has                            taken no previous anticoagulant or antiplatelet                            agents. ASA Grade Assessment: III - A patient with                            severe systemic disease. After reviewing the risks  and benefits, the patient was deemed in                            satisfactory condition to undergo the procedure.                           After obtaining informed consent, the colonoscope                            was passed under direct vision. Throughout the                            procedure, the patient's blood  pressure, pulse, and                            oxygen saturations were monitored continuously. The                            PCF-H190DL ([id number] Olympus pediatric colonoscope                            was introduced through the anus and advanced to the                            the terminal ileum, with identification of the                            appendiceal orifice and IC valve. The colonoscopy                            was somewhat difficult due to significant looping.                            Successful completion of the procedure was aided by                            applying abdominal pressure. The patient tolerated                            the procedure well. The quality of the bowel                            preparation was excellent. The bowel preparation                            used was MoviPrep via split dose instruction. Scope In: 11:26:12 AM Scope Out: 11:42:12 AM Scope Withdrawal Time: 0 hours 8 minutes 5 seconds  Total Procedure Duration: 0 hours 16 minutes 0 seconds  Findings:      The perianal and digital rectal examinations were normal.      A 1 mm polyp was found in the ascending colon. The polyp was sessile.       The polyp was removed with a cold biopsy forceps. Resection and       retrieval were  complete. Verification of patient identification for the       specimen was done. Estimated blood loss was minimal.      The terminal ileum appeared normal.      The exam was otherwise without abnormality on direct and retroflexion       views. Impression:               - One 1 mm polyp in the ascending colon, removed                            with a cold biopsy forceps. Resected and retrieved.                           - The examined portion of the ileum was normal.                           - The examination was otherwise normal on direct                            and retroflexion views.                           - SUSPECT ANEMIA IS MULTIFACTORIAL -  GASTRITIS MAY                            HAVE CAUSED HEME + STOOL - AREA OF PRIOR                            INFLAMMATION IN PROXIMAL COLON PON IMAGING IN PAST                            ALL NORMAL Recommendation:           - Return patient to hospital ward for ongoing care.                           - Resume regular diet.                           - Continue present medications.                           - Await pathology results. I will let her know                            after dc                           - No repeat colonoscopy due to age.                           - can check iron studies as outpatient - had blood                            here so could be inaccurate - is on B12 injections Procedure Code(s):        ---  Professional ---                           [phone], Colonoscopy, flexible; with biopsy, single                            or multiple Diagnosis Code(s):        --- Professional ---                           K63.5, Polyp of colon                           R19.5, Other fecal abnormalities                           D64.9, Anemia, unspecified                           R19.4, Change in bowel habit CPT copyright 2019 [employer]. All rights reserved. The codes documented in this report are preliminary and upon coder review may  be revised to meet current compliance requirements. Gatha Mayer, MD 05/03/2020 11:59:13 AM This report has been signed electronically. Number of Addenda: 0

## 2020-05-03 NOTE — Evaluation (Signed)
Physical Therapy Evaluation Patient Details Name: Autumn Conley MRN: [id number] DOB: July 28, 1946 Today's Date: 05/03/2020   History of Present Illness  74 y.o. female with medical history significant for thyoma being followed by Oncology, Hx of Pagets disease of breast, HTN, HLD, anxiety, IBS, NASH, fibromyalgia who presents for evaluation of generalized weakness, dizziness, shortness of breath with exertion. Pt was found to have low Hgb and hematocrit levels. Pt underwent colonoscopy and upper GI endoscopy which demonstrated Erosive gastropathy with no stigmata of recent bleeding.  Clinical Impression  Pt presents to PT at or near her functional baseline. Pt is able to mobilize independently at a household and limited community level. Pt reports resolution of weakness, and denies dizziness/wooziness during session. Pt does report some mild increase In her work of breathing after stair negotiation however she remains able to hold conversation with continued ambulation. Pt feels at or near her functional baseline and expresses no concerns over mobility at this time. PT does encourage pt to gradually increase activity upon return home as she expresses that she enjoyed walking multiple miles each day prior to cancer treatment. Pt requires no current PT or DME at this time. Pt require no further acute PT services. Acute PT signing off.    Follow Up Recommendations No PT follow up    Equipment Recommendations  None recommended by PT    Recommendations for Other Services       Precautions / Restrictions Precautions Precautions: None Restrictions Weight Bearing Restrictions: No      Mobility  Bed Mobility               General bed mobility comments: pt received and left in recliner  Transfers Overall transfer level: Independent Equipment used: None             General transfer comment: pt completes sit to stand independently  Ambulation/Gait Ambulation/Gait assistance:  Independent Gait Distance (Feet): 150 Feet Assistive device: None Gait Pattern/deviations: Step-through pattern Gait velocity: functional Gait velocity interpretation: 1.31 - 2.62 ft/sec, indicative of limited community ambulator General Gait Details: pt with slowed step through gait, no significant gait or balance deviaitons noted  Stairs Stairs: Yes Stairs assistance: Modified independent (Device/Increase time) Stair Management: One rail Left Number of Stairs: 4    Wheelchair Mobility    Modified Rankin (Stroke Patients Only)       Balance Overall balance assessment: Mild deficits observed, not formally tested                                           Pertinent Vitals/Pain Pain Assessment: No/denies pain    Home Living Family/patient expects to be discharged to:: Private residence Living Arrangements: Spouse/significant other Available Help at Discharge: Family;Available 24 hours/day Type of Home: House Home Access: Stairs to enter Entrance Stairs-Rails: Left Entrance Stairs-Number of Steps: 3 Home Layout: One level Home Equipment: None      Prior Function Level of Independence: Independent         Comments: pt reports she has been moving slower and has reduced activity tolerance since undergoing chemo and radiation for thymoma, but still remains independent in mobility and ADLs     Hand Dominance        Extremity/Trunk Assessment   Upper Extremity Assessment Upper Extremity Assessment: Overall WFL for tasks assessed    Lower Extremity Assessment Lower Extremity Assessment: Overall WFL for tasks  assessed    Cervical / Trunk Assessment Cervical / Trunk Assessment: Normal  Communication   Communication: No difficulties  Cognition Arousal/Alertness: Awake/alert Behavior During Therapy: WFL for tasks assessed/performed Overall Cognitive Status: Within Functional Limits for tasks assessed                                         General Comments General comments (skin integrity, edema, etc.): VSS on RA, pt reports some increased work of breathing after stair negotiation howecer sats are stable and she remains able to converse during mobility    Exercises     Assessment/Plan    PT Assessment Patent does not need any further PT services  PT Problem List         PT Treatment Interventions      PT Goals (Current goals can be found in the Care Plan section)       Frequency     Barriers to discharge        Co-evaluation               AM-PAC PT "6 Clicks" Mobility  Outcome Measure Help needed turning from your back to your side while in a flat bed without using bedrails?: None Help needed moving from lying on your back to sitting on the side of a flat bed without using bedrails?: None Help needed moving to and from a bed to a chair (including a wheelchair)?: None Help needed standing up from a chair using your arms (e.g., wheelchair or bedside chair)?: None Help needed to walk in hospital room?: None Help needed climbing 3-5 steps with a railing? : None 6 Click Score: 24    End of Session   Activity Tolerance: Patient tolerated treatment well Patient left: in chair;with call bell/phone within reach;with family/visitor present Nurse Communication: Mobility status      Time: 3794-3276 PT Time Calculation (min) (ACUTE ONLY): 19 min   Charges:   PT Evaluation $PT Eval Low Complexity: [place], PT, DPT Acute Rehabilitation Pager: [phone]   Zenaida Niece 05/03/2020, 1:36 PM

## 2020-05-03 NOTE — Anesthesia Procedure Notes (Signed)
Procedure Name: MAC Date/Time: 05/03/2020 11:15 AM Performed by: Lowella Dell, CRNA Pre-anesthesia Checklist: Patient identified, Emergency Drugs available, Suction available, Patient being monitored and Timeout performed Patient Re-evaluated:Patient Re-evaluated prior to induction Oxygen Delivery Method: Nasal cannula Induction Type: IV induction Placement Confirmation: positive ETCO2 Dental Injury: Teeth and Oropharynx as per pre-operative assessment

## 2020-05-04 ENCOUNTER — Other Ambulatory Visit: Payer: Self-pay | Admitting: Physician Assistant

## 2020-05-04 DIAGNOSIS — Z862 Personal history of diseases of the blood and blood-forming organs and certain disorders involving the immune mechanism: Secondary | ICD-10-CM

## 2020-05-04 DIAGNOSIS — R768 Other specified abnormal immunological findings in serum: Secondary | ICD-10-CM

## 2020-05-04 DIAGNOSIS — R5383 Other fatigue: Secondary | ICD-10-CM

## 2020-05-04 LAB — SURGICAL PATHOLOGY

## 2020-05-06 ENCOUNTER — Encounter: Payer: Self-pay | Admitting: Internal Medicine

## 2020-05-07 ENCOUNTER — Encounter (HOSPITAL_COMMUNITY): Payer: Self-pay | Admitting: Internal Medicine

## 2020-05-07 ENCOUNTER — Other Ambulatory Visit: Payer: Self-pay | Admitting: Physician Assistant

## 2020-05-08 ENCOUNTER — Ambulatory Visit (INDEPENDENT_AMBULATORY_CARE_PROVIDER_SITE_OTHER): Payer: Medicare Other | Admitting: Physician Assistant

## 2020-05-08 ENCOUNTER — Emergency Department (HOSPITAL_COMMUNITY)
Admission: EM | Admit: 2020-05-08 | Discharge: 2020-05-09 | Disposition: A | Discharge status: Home / Self Care | Payer: Medicare Other | Attending: Emergency Medicine | Admitting: Emergency Medicine

## 2020-05-08 ENCOUNTER — Encounter (HOSPITAL_COMMUNITY): Payer: Self-pay | Admitting: Emergency Medicine

## 2020-05-08 ENCOUNTER — Encounter: Payer: Self-pay | Admitting: Physician Assistant

## 2020-05-08 ENCOUNTER — Other Ambulatory Visit: Payer: Self-pay

## 2020-05-08 VITALS — BP 118/64 | HR 96 | Temp 97.8°F | Ht 63.0 in | Wt 167.8 lb

## 2020-05-08 DIAGNOSIS — Z853 Personal history of malignant neoplasm of breast: Secondary | ICD-10-CM | POA: Insufficient documentation

## 2020-05-08 DIAGNOSIS — Z79899 Other long term (current) drug therapy: Secondary | ICD-10-CM | POA: Diagnosis not present

## 2020-05-08 DIAGNOSIS — Z7982 Long term (current) use of aspirin: Secondary | ICD-10-CM | POA: Diagnosis not present

## 2020-05-08 DIAGNOSIS — Z85238 Personal history of other malignant neoplasm of thymus: Secondary | ICD-10-CM | POA: Diagnosis not present

## 2020-05-08 DIAGNOSIS — R0602 Shortness of breath: Secondary | ICD-10-CM | POA: Diagnosis not present

## 2020-05-08 DIAGNOSIS — D649 Anemia, unspecified: Secondary | ICD-10-CM

## 2020-05-08 DIAGNOSIS — I10 Essential (primary) hypertension: Secondary | ICD-10-CM | POA: Insufficient documentation

## 2020-05-08 DIAGNOSIS — D500 Iron deficiency anemia secondary to blood loss (chronic): Secondary | ICD-10-CM | POA: Insufficient documentation

## 2020-05-08 DIAGNOSIS — Z91040 Latex allergy status: Secondary | ICD-10-CM | POA: Insufficient documentation

## 2020-05-08 LAB — COMPREHENSIVE METABOLIC PANEL
ALT: 63 U/L — ABNORMAL HIGH (ref 0–44)
AST: 63 U/L — ABNORMAL HIGH (ref 15–41)
Albumin: 4.1 g/dL (ref 3.5–5.0)
Alkaline Phosphatase: 109 U/L (ref 38–126)
Anion gap: 9 (ref 5–15)
BUN: 17 mg/dL (ref 8–23)
CO2: 25 mmol/L (ref 22–32)
Calcium: 9.2 mg/dL (ref 8.9–10.3)
Chloride: 100 mmol/L (ref 98–111)
Creatinine, Ser: 0.9 mg/dL (ref 0.44–1.00)
GFR calc Af Amer: >60 mL/min (ref >60)
GFR calc non Af Amer: >60 mL/min (ref >60)
Glucose, Bld: 123 mg/dL — ABNORMAL HIGH (ref 70–99)
Potassium: 4.1 mmol/L (ref 3.5–5.1)
Sodium: 134 mmol/L — ABNORMAL LOW (ref 135–145)
Total Bilirubin: 0.4 mg/dL (ref 0.3–1.2)
Total Protein: 6.8 g/dL (ref 6.5–8.1)

## 2020-05-08 LAB — CBC WITH DIFFERENTIAL/PLATELET
Abs Immature Granulocytes: 0.01 10*3/uL (ref 0.00–0.07)
Basophils Absolute: 0.1 10*3/uL (ref 0.0–0.1)
Basophils Relative: 1 %
Eosinophils Absolute: 0.1 10*3/uL (ref 0.0–0.5)
Eosinophils Relative: 2 %
HCT: 29.8 % — ABNORMAL LOW (ref 36.0–46.0)
Hemoglobin: 8.9 g/dL — ABNORMAL LOW (ref 12.0–15.0)
Immature Granulocytes: 0 %
Lymphocytes Relative: 17 %
Lymphs Abs: 0.8 10*3/uL (ref 0.7–4.0)
MCH: 26.4 pg (ref 26.0–34.0)
MCHC: 29.9 g/dL — ABNORMAL LOW (ref 30.0–36.0)
MCV: 88.4 fL (ref 80.0–100.0)
Monocytes Absolute: 0.6 10*3/uL (ref 0.1–1.0)
Monocytes Relative: 13 %
Neutro Abs: 3 10*3/uL (ref 1.7–7.7)
Neutrophils Relative %: 67 %
Platelets: 246 10*3/uL (ref 150–400)
RBC: 3.37 MIL/uL — ABNORMAL LOW (ref 3.87–5.11)
RDW: 14.7 % (ref 11.5–15.5)
WBC: 4.5 10*3/uL (ref 4.0–10.5)
nRBC: 0 % (ref 0.0–0.2)

## 2020-05-08 LAB — TYPE AND SCREEN
ABO/RH(D): O POS
Antibody Screen: NEGATIVE

## 2020-05-08 LAB — POCT HEMOGLOBIN: Hemoglobin: 6.6 g/dL — AB (ref 11–14.6)

## 2020-05-08 NOTE — Patient Instructions (Signed)
It was great to see you!  Please go to the ER. Your hemoglobin in the office is 6.6.  I will send the note to the oncologist about today's visit.  Take care,  [name] PA-C

## 2020-05-08 NOTE — ED Triage Notes (Signed)
Pt st's she went for follow up apt today after having blood transfusion last week.  St's hgb was 6 at MD's office.  Pt c/o shortness of breath with walking

## 2020-05-08 NOTE — Progress Notes (Signed)
Autumn Conley is a 74 y.o. female is here for hospital follow up.  I acted as a Education administrator for [employer], PA-C [employer], [place]   History of Present Illness:   Chief Complaint  Patient presents with  . Hospitalization Follow-up    HPI   Hospital follow up Patient was admitted to [facility] 05/01/20 and discharged 05/03/20. She was instructed to go to the ER after we obtained labs which showed critical hemoglobin of 7.6 with symptoms of fatigue, dyspnea on exertion. She received blood transfusions in the hospital. She was FOBT positive in the ER. GI was consulted. EGD showed erosive gastropathy with no evidence of recent bleeding, however it was biopsied. She had a 1 mm polyp in the her colon. GI suspected that "her anemia is multifactorial and the gastritis may have caused her heme positive stool and she had one area of inflammation in the proximal colon in the past which is now normal."  She states she had two "dizzy spells", which one occurred today after getting out of the car. Pt has been SOB today.   She was told to stop her butalbital and ASA, which she has done.   Health Maintenance Due  Topic Date Due  . PNA vac Low Risk Adult (2 of 2 - PPSV23) 08/15/2016    Past Medical History:  Diagnosis Date  . Anemia   . Anxiety   . Depression   . Dyspnea   . Fibromyalgia    "some; not chronic" (11/29/2014)  . GERD (gastroesophageal reflux disease)   . Glaucoma of both eyes   . Headache   . Hypertension   . IBS (irritable bowel syndrome)   . Mitral valve prolapse   . NASH (nonalcoholic steatohepatitis)   . Osteoarthritis   . PONV (postoperative nausea and vomiting)   . Stroke ([place])   . 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[facility])      Social History   Tobacco Use  . Smoking status: Never Smoker  . Smokeless tobacco: Never Used  Vaping Use  . Vaping Use: Never used  Substance Use Topics  . Alcohol use: No  . Drug use: No    Past Surgical History:  Procedure Laterality Date  .  ABDOMINAL HYSTERECTOMY  1980  . APPENDECTOMY  1953  . BIOPSY  05/03/2020   Procedure: BIOPSY;  Surgeon: Gatha Mayer, MD;  Location: [place];  Service: Endoscopy;;  . BREAST BIOPSY Left   . BREAST LUMPECTOMY Left   . CATARACT EXTRACTION, BILATERAL  2018  . COLONOSCOPY WITH PROPOFOL N/A 05/03/2020   Procedure: COLONOSCOPY WITH PROPOFOL;  Surgeon: Gatha Mayer, MD;  Location: [facility] ENDOSCOPY;  Service: Endoscopy;  Laterality: N/A;  . ESOPHAGOGASTRODUODENOSCOPY (EGD) WITH PROPOFOL N/A 05/03/2020   Procedure: ESOPHAGOGASTRODUODENOSCOPY (EGD) WITH PROPOFOL;  Surgeon: Gatha Mayer, MD;  Location: [place];  Service: Endoscopy;  Laterality: N/A;  . MASTECTOMY Left ~ 2009  . PARASTERNAL EXPLORATION Right 02/14/2019   Procedure: PARASTERNAL MEDIAL EXPLORATION WITH BIOPIES.;  Surgeon: [name], MD;  Location: [place];  Service: Thoracic;  Laterality: Right;  . POLYPECTOMY  05/03/2020   Procedure: POLYPECTOMY;  Surgeon: Gatha Mayer, MD;  Location: [facility] ENDOSCOPY;  Service: Endoscopy;;    Family History  Problem Relation Age of Onset  . Lung cancer Mother 60  . Prostate cancer Brother 16    PMHx, SurgHx, SocialHx, FamHx, Medications, and Allergies were reviewed in the Visit Navigator and updated as appropriate.   Patient Active Problem List  Diagnosis Date Noted  . Erosive gastritis   . Benign neoplasm of ascending colon   . Heme + stool   . Change in bowel habit   . Anemia 05/01/2020  . Dyspnea 05/01/2020  . Generalized weakness 05/01/2020  . UTI (urinary tract infection) 05/01/2020  . Symptomatic anemia 05/01/2020  . Glaucoma of both eyes   . NASH (nonalcoholic steatohepatitis)   . Mitral valve prolapse   . Thymoma -- managed by [facility] - extensive documentation in Care Everywhere 03/22/2019  . Breast cancer ([place]) 03/18/2019  . Mass of upper lobe of right lung 01/20/2019  . B12 deficiency 12/19/2018  . Stress incontinence in female 12/19/2018  . Dyslipidemia  12/14/2018  . OSA (obstructive sleep apnea) 12/07/2018  . History of breast cancer 12/07/2018  . Cognitive complaints 11/25/2018  . False positive stress test 01/29/2015  . Migraine without aura and without status migrainosus, not intractable 12/18/2014  . Small vessel disease, cerebrovascular 12/18/2014  . GERD (gastroesophageal reflux disease) 12/02/2014  . HTN (hypertension) 12/27/2013  . Anxiety 12/27/2013  . Depression 11/03/2012  . Elevated LFTs 11/03/2012  . Fibromyalgia 11/03/2012  . IBS (irritable bowel syndrome) 11/03/2012  . Lymphocytic colitis 11/03/2012  . Paget's disease of nipple ([place]) 11/03/2012  . Vitamin D deficiency 11/03/2012  . Osteoarthritis 11/03/2012    Social History   Tobacco Use  . Smoking status: Never Smoker  . Smokeless tobacco: Never Used  Vaping Use  . Vaping Use: Never used  Substance Use Topics  . Alcohol use: No  . Drug use: No    Current Medications and Allergies:    Current Outpatient Medications:  .  ALPRAZolam (XANAX) 0.25 MG tablet, Take 0.5 tablets (0.125 mg total) by mouth daily as needed for anxiety., Disp: 30 tablet, Rfl: 0 .  Ascorbic Acid (VITAMIN C) 1000 MG tablet, Take 1,000 mg by mouth daily., Disp: , Rfl:  .  aspirin EC 81 MG tablet, Take 81 mg by mouth at bedtime. , Disp: , Rfl:  .  atorvastatin (LIPITOR) 40 MG tablet, TAKE 1 TABLET BY MOUTH AT  BEDTIME (Patient taking differently: Take 40 mg by mouth at bedtime. ), Disp: 90 tablet, Rfl: 3 .  Cholecalciferol (VITAMIN D-3 PO), Take 1 capsule by mouth daily., Disp: , Rfl:  .  cyanocobalamin (,VITAMIN B-12,) 1000 MCG/ML injection, Inject 1 mL (1,000 mcg total) into the muscle every 30 (thirty) days., Disp: 3 mL, Rfl: 2 .  gabapentin (NEURONTIN) 300 MG capsule, Take 300 mg by mouth 2 (two) times daily. , Disp: , Rfl:  .  irbesartan (AVAPRO) 150 MG tablet, TAKE 1 TABLET BY MOUTH  DAILY (Patient taking differently: Take 150 mg by mouth in the morning. ), Disp: 90 tablet, Rfl:  3 .  ondansetron (ZOFRAN) 8 MG tablet, Take 8 mg by mouth every 8 (eight) hours as needed for nausea or vomiting., Disp: , Rfl:  .  pantoprazole (PROTONIX) 40 MG tablet, Take 1 tablet (40 mg total) by mouth daily before breakfast., Disp: 30 tablet, Rfl: 0 .  PARoxetine (PAXIL) 20 MG tablet, TAKE 3 TABLETS BY MOUTH IN  THE MORNING (Patient taking differently: Take 60 mg by mouth in the morning. ), Disp: 270 tablet, Rfl: 3 .  polyethylene glycol powder (GLYCOLAX/MIRALAX) 17 GM/SCOOP powder, Take 17 g by mouth daily as needed for mild constipation., Disp: , Rfl:  .  SUMAtriptan (IMITREX) 100 MG tablet, Take 1 tablet (100 mg total) by mouth 2 (two) times daily as needed. At least  2 hrs between doses as needed bid (Patient taking differently: Take 100 mg by mouth 2 (two) times daily as needed for migraine or headache (and may repeat once in 2 hours, if no relief). ), Disp: 10 tablet, Rfl: 3 .  SYRINGE-NEEDLE, DISP, 3 ML 25G X 1" 3 ML MISC, Use to inject Vit B12 once a month., Disp: 12 each, Rfl: 1 .  valACYclovir (VALTREX) 1000 MG tablet, TAKE 2 TABLETS BY MOUTH  EVERY 12 HOURS FOR 1 DAY;  START: ASAP AFTER SYMPTOM  ONSET, Disp: 12 tablet, Rfl: 3 .  vitamin E (VITAMIN E) 180 MG (400 UNITS) capsule, Take 400 Units by mouth daily., Disp: , Rfl:    Allergies  Allergen Reactions  . Latex Rash and Other (See Comments)    Blisters, also    Review of Systems   ROS  Negative unless otherwise specified per HPI.  Vitals:   Vitals:   05/08/20 1347  BP: 118/64  Pulse: 96  Temp: 97.8 F (36.6 C)  TempSrc: Temporal  SpO2: 95%  Weight: 167 lb 12.8 oz (76.1 kg)  Height: 5' 3"  (1.6 m)     Body mass index is 29.72 kg/m.   Physical Exam:    Physical Exam Constitutional:      Appearance: She is well-developed.  HENT:     Head: Normocephalic and atraumatic.  Eyes:     Conjunctiva/sclera: Conjunctivae normal.  Pulmonary:     Effort: Pulmonary effort is normal.  Musculoskeletal:         General: Normal range of motion.     Cervical back: Normal range of motion and neck supple.  Skin:    General: Skin is warm and dry.  Neurological:     Mental Status: She is alert and oriented to person, place, and time.  Psychiatric:        Behavior: Behavior normal.        Thought Content: Thought content normal.        Judgment: Judgment normal.      Results for orders placed or performed in visit on 05/08/20  POCT hemoglobin  Result Value Ref Range   Hemoglobin 6.6 (A) 11 - 14.6 g/dL    Assessment and Plan:    Symptomatic Anemia POC hemoglobin is lower today than it has been in the hospital -- currently 6.6. Recommended patient to return to ER.   CMA or LPN served as scribe during this visit. History, Physical, and Plan performed by medical provider. The above documentation has been reviewed and is accurate and complete.  [name], PA-C [place], [place] 05/08/2020  Follow-up: No follow-ups on file.   Time spent with patient today was 45 minutes which consisted of chart review, discussing diagnosis, work up, treatment answering questions and documentation.

## 2020-05-09 ENCOUNTER — Other Ambulatory Visit: Payer: Self-pay | Admitting: Physician Assistant

## 2020-05-09 DIAGNOSIS — R0602 Shortness of breath: Secondary | ICD-10-CM

## 2020-05-09 DIAGNOSIS — R42 Dizziness and giddiness: Secondary | ICD-10-CM

## 2020-05-09 DIAGNOSIS — D500 Iron deficiency anemia secondary to blood loss (chronic): Secondary | ICD-10-CM | POA: Diagnosis not present

## 2020-05-09 LAB — HEMOGLOBIN AND HEMATOCRIT, BLOOD
HCT: 29.8 % — ABNORMAL LOW (ref 36.0–46.0)
Hemoglobin: 9.3 g/dL — ABNORMAL LOW (ref 12.0–15.0)

## 2020-05-09 LAB — OCCULT BLOOD X 1 CARD TO LAB, STOOL: Fecal Occult Bld: NEGATIVE

## 2020-05-09 LAB — TROPONIN I (HIGH SENSITIVITY): Troponin I (High Sensitivity): 6 ng/L (ref <18)

## 2020-05-09 NOTE — ED Provider Notes (Signed)
[facility] EMERGENCY DEPARTMENT Provider Note   CSN: [id number] Arrival date & time: 05/08/20  1544     History Chief Complaint  Patient presents with  . Low Hgb    Autumn Conley is a 74 y.o. female.  Patient with history of fibromyalgia, depression, thymoma, previous, recent admission for symptomatic anemia GI bleeding presenting at the request of her PCP. States her blood count is down to six at the doctor's office today.  Patient was told to come back to the hospital by her PCP today because her blood count was down to 6.6.  She denies any further rectal bleeding and did not know she was having any in the first place last week.  She had a full work-up including EGD and colonoscopy as well as CT scans of her chest, abdomen and pelvis.  Ultimately was decided that her GI bleeding was likely multifactorial from obstructive gastropathy as well as possible colon polyp. Patient states she would not be here if she was not told to come by her PCP.  She denies any chest pain.  She has shortness of breath which is chronic and unchanged.  Is actually improved compared to last week.  States she had one episode of "woozy spell" while in the waiting room that lasted for a few seconds and has since resolved.  She had 2 similar episodes at home since her discharge.  She denies any room spinning dizziness or lightheadedness currently.  Denies any abdominal pain or chest pain.  No cough or fever.  Does not take any blood thinners at home.    "EGD showed erosive gastropathy with no stigmata of recent bleeding and this was biopsied and this could be the source of her heme positive stool and may have contributed to her anemia.  GI recommends continuing pantoprazole 40 mg p.o. daily.  She also had a 1 mm polyp in the ascending colon which was removed with a cold biopsy.  GI suspect that her anemia is multifactorial and the gastritis may have caused her heme positive stool and she had one area of  inflammation in the proximal colon in the past which is now normal."  The history is provided by the patient.       Past Medical History:  Diagnosis Date  . Anemia   . Anxiety   . Depression   . Dyspnea   . Fibromyalgia    "some; not chronic" (11/29/2014)  . GERD (gastroesophageal reflux disease)   . Glaucoma of both eyes   . Headache   . Hypertension   . IBS (irritable bowel syndrome)   . Mitral valve prolapse   . NASH (nonalcoholic steatohepatitis)   . Osteoarthritis   . PONV (postoperative nausea and vomiting)   . Stroke ([place])   . 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[facility])     Patient Active Problem List   Diagnosis Date Noted  . Erosive gastritis   . Benign neoplasm of ascending colon   . Heme + stool   . Change in bowel habit   . Anemia 05/01/2020  . Dyspnea 05/01/2020  . Generalized weakness 05/01/2020  . UTI (urinary tract infection) 05/01/2020  . Symptomatic anemia 05/01/2020  . Glaucoma of both eyes   . NASH (nonalcoholic steatohepatitis)   . Mitral valve prolapse   . Thymoma -- managed by [facility] - extensive documentation in Care Everywhere 03/22/2019  . Breast cancer ([place]) 03/18/2019  . Mass of upper lobe of right lung 01/20/2019  . B12 deficiency 12/19/2018  .  Stress incontinence in female 12/19/2018  . Dyslipidemia 12/14/2018  . OSA (obstructive sleep apnea) 12/07/2018  . History of breast cancer 12/07/2018  . Cognitive complaints 11/25/2018  . False positive stress test 01/29/2015  . Migraine without aura and without status migrainosus, not intractable 12/18/2014  . Small vessel disease, cerebrovascular 12/18/2014  . GERD (gastroesophageal reflux disease) 12/02/2014  . HTN (hypertension) 12/27/2013  . Anxiety 12/27/2013  . Depression 11/03/2012  . Elevated LFTs 11/03/2012  . Fibromyalgia 11/03/2012  . IBS (irritable bowel syndrome) 11/03/2012  . Lymphocytic colitis 11/03/2012  . Paget's disease of nipple ([place]) 11/03/2012  . Vitamin D deficiency 11/03/2012  .  Osteoarthritis 11/03/2012    Past Surgical History:  Procedure Laterality Date  . ABDOMINAL HYSTERECTOMY  1980  . APPENDECTOMY  1953  . BIOPSY  05/03/2020   Procedure: BIOPSY;  Surgeon: Gatha Mayer, MD;  Location: [place];  Service: Endoscopy;;  . BREAST BIOPSY Left   . BREAST LUMPECTOMY Left   . CATARACT EXTRACTION, BILATERAL  2018  . COLONOSCOPY WITH PROPOFOL N/A 05/03/2020   Procedure: COLONOSCOPY WITH PROPOFOL;  Surgeon: Gatha Mayer, MD;  Location: [facility] ENDOSCOPY;  Service: Endoscopy;  Laterality: N/A;  . ESOPHAGOGASTRODUODENOSCOPY (EGD) WITH PROPOFOL N/A 05/03/2020   Procedure: ESOPHAGOGASTRODUODENOSCOPY (EGD) WITH PROPOFOL;  Surgeon: Gatha Mayer, MD;  Location: [place];  Service: Endoscopy;  Laterality: N/A;  . MASTECTOMY Left ~ 2009  . PARASTERNAL EXPLORATION Right 02/14/2019   Procedure: PARASTERNAL MEDIAL EXPLORATION WITH BIOPIES.;  Surgeon: [name], MD;  Location: [place];  Service: Thoracic;  Laterality: Right;  . POLYPECTOMY  05/03/2020   Procedure: POLYPECTOMY;  Surgeon: Gatha Mayer, MD;  Location: [facility] ENDOSCOPY;  Service: Endoscopy;;     OB History   No obstetric history on file.     Family History  Problem Relation Age of Onset  . Lung cancer Mother 95  . Prostate cancer Brother 18    Social History   Tobacco Use  . Smoking status: Never Smoker  . Smokeless tobacco: Never Used  Vaping Use  . Vaping Use: Never used  Substance Use Topics  . Alcohol use: No  . Drug use: No    Home Medications Prior to Admission medications   Medication Sig Start Date End Date Taking? Authorizing Provider  ALPRAZolam Duanne Moron) 0.25 MG tablet Take 0.5 tablets (0.125 mg total) by mouth daily as needed for anxiety. 02/27/20   [name], PA  Ascorbic Acid (VITAMIN C) 1000 MG tablet Take 1,000 mg by mouth daily.    [provider]  aspirin EC 81 MG tablet Take 81 mg by mouth at bedtime.     [provider]  atorvastatin  (LIPITOR) 40 MG tablet TAKE 1 TABLET BY MOUTH AT  BEDTIME Patient taking differently: Take 40 mg by mouth at bedtime.  11/29/19   Leamon Arnt, MD  Cholecalciferol (VITAMIN D-3 PO) Take 1 capsule by mouth daily.    [provider]  cyanocobalamin (,VITAMIN B-12,) 1000 MCG/ML injection Inject 1 mL (1,000 mcg total) into the muscle every 30 (thirty) days. 03/01/20   [name], PA  gabapentin (NEURONTIN) 300 MG capsule Take 300 mg by mouth 2 (two) times daily.  10/14/18   [provider]  irbesartan (AVAPRO) 150 MG tablet TAKE 1 TABLET BY MOUTH  DAILY Patient taking differently: Take 150 mg by mouth in the morning.  12/26/19   Leamon Arnt, MD  ondansetron (ZOFRAN) 8 MG tablet Take 8 mg by  mouth every 8 (eight) hours as needed for nausea or vomiting.    [provider]  pantoprazole (PROTONIX) 40 MG tablet Take 1 tablet (40 mg total) by mouth daily before breakfast. 05/04/20   Raiford Noble Latif, DO  PARoxetine (PAXIL) 20 MG tablet TAKE 3 TABLETS BY MOUTH IN  THE MORNING Patient taking differently: Take 60 mg by mouth in the morning.  12/26/19   Leamon Arnt, MD  polyethylene glycol powder (GLYCOLAX/MIRALAX) 17 GM/SCOOP powder Take 17 g by mouth daily as needed for mild constipation.    [provider]  SUMAtriptan (IMITREX) 100 MG tablet Take 1 tablet (100 mg total) by mouth 2 (two) times daily as needed. At least 2 hrs between doses as needed bid Patient taking differently: Take 100 mg by mouth 2 (two) times daily as needed for migraine or headache (and may repeat once in 2 hours, if no relief).  02/27/20   Worley, [name], PA  SYRINGE-NEEDLE, DISP, 3 ML 25G X 1" 3 ML MISC Use to inject Vit B12 once a month. 03/01/20   [name], PA  valACYclovir (VALTREX) 1000 MG tablet TAKE 2 TABLETS BY MOUTH  EVERY 12 HOURS FOR 1 DAY;  START: ASAP AFTER SYMPTOM  ONSET 05/07/20   [name], PA  vitamin E (VITAMIN E) 180 MG (400 UNITS) capsule Take 400 Units by  mouth daily.    [provider]    Allergies    Latex  Review of Systems   Review of Systems  Constitutional: Positive for fatigue. Negative for activity change, appetite change and fever.  HENT: Negative for congestion and rhinorrhea.   Respiratory: Positive for shortness of breath. Negative for cough and chest tightness.   Cardiovascular: Negative for chest pain.  Gastrointestinal: Negative for abdominal pain, nausea and vomiting.  Genitourinary: Negative for dysuria and hematuria.  Musculoskeletal: Negative for arthralgias and myalgias.  Neurological: Positive for dizziness, weakness and light-headedness.   all other systems are negative except as noted in the HPI and PMH.    Physical Exam Updated Vital Signs BP (!) 124/103   Pulse 98   Temp 98.1 F (36.7 C) (Oral)   Resp 14   Ht 5' 3"  (1.6 m)   Wt 76.1 kg   SpO2 96%   BMI 29.72 kg/m   Physical Exam Vitals and nursing note reviewed.  Constitutional:      General: She is not in acute distress.    Appearance: Normal appearance. She is well-developed and normal weight.  HENT:     Head: Normocephalic and atraumatic.     Mouth/Throat:     Pharynx: No oropharyngeal exudate.  Eyes:     Conjunctiva/sclera: Conjunctivae normal.     Pupils: Pupils are equal, round, and reactive to light.     Comments: No conjunctival pallor  Neck:     Comments: No meningismus. Cardiovascular:     Rate and Rhythm: Normal rate and regular rhythm.     Heart sounds: Normal heart sounds. No murmur heard.   Pulmonary:     Effort: Pulmonary effort is normal. No respiratory distress.     Breath sounds: Normal breath sounds.  Chest:     Chest wall: No tenderness.  Abdominal:     Palpations: Abdomen is soft.     Tenderness: There is no abdominal tenderness. There is no guarding or rebound.  Genitourinary:    Comments: Chaperone present.  No external hemorrhoids seen no fissures, no gross blood Musculoskeletal:  General: No  tenderness. Normal range of motion.     Cervical back: Normal range of motion and neck supple.  Skin:    General: Skin is warm.     Capillary Refill: Capillary refill takes less than 2 seconds.  Neurological:     General: No focal deficit present.     Mental Status: She is alert and oriented to person, place, and time. Mental status is at baseline.     Cranial Nerves: No cranial nerve deficit.     Motor: No abnormal muscle tone.     Coordination: Coordination normal.     Comments: No ataxia on finger to nose bilaterally. No pronator drift. 5/5 strength throughout. CN 2-12 intact.Equal grip strength. Sensation intact.   Psychiatric:        Behavior: Behavior normal.     ED Results / Procedures / Treatments   Labs (all labs ordered are listed, but only abnormal results are displayed) Labs Reviewed  CBC WITH DIFFERENTIAL/PLATELET - Abnormal; Notable for the following components:      Result Value   RBC 3.37 (*)    Hemoglobin 8.9 (*)    HCT 29.8 (*)    MCHC 29.9 (*)    All other components within normal limits  COMPREHENSIVE METABOLIC PANEL - Abnormal; Notable for the following components:   Sodium 134 (*)    Glucose, Bld 123 (*)    AST 63 (*)    ALT 63 (*)    All other components within normal limits  HEMOGLOBIN AND HEMATOCRIT, BLOOD - Abnormal; Notable for the following components:   Hemoglobin 9.3 (*)    HCT 29.8 (*)    All other components within normal limits  OCCULT BLOOD X 1 CARD TO LAB, STOOL  POC OCCULT BLOOD, ED  TYPE AND SCREEN  TROPONIN I (HIGH SENSITIVITY)    EKG EKG Interpretation  Date/Time:  Wednesday May 09 2020 03:41:17 EDT Ventricular Rate:  93 PR Interval:    QRS Duration: 95 QT Interval:  382 QTC Calculation: 476 R Axis:   41 Text Interpretation: Sinus rhythm Probable left atrial enlargement No significant change was found Confirmed by Ezequiel Essex [phone]) on 05/09/2020 3:50:42 AM   Radiology No results found.  Procedures Procedures  (including critical care time)  Medications Ordered in ED Medications - No data to display  ED Course  I have reviewed the triage vital signs and the nursing notes.  Pertinent labs & imaging results that were available during my care of the patient were reviewed by me and considered in my medical decision making (see chart for details).    MDM Rules/Calculators/A&P                         Patient here at the request of her PCP after being found to have a low hemoglobin in the office.  She was admitted last week with chronic fatigue and shortness of breath and found to have GI bleed and anemia.  She had a full work-up including EGD and colonoscopy as above.  When she checked in the ED 12 hours ago her hemoglobin was 8.9 which is similar to her discharge on 7/15.  Suspect the hemoglobin of 6.6 was spurious.  In regards to patient's shortness of breath this has been an ongoing issue for many months.  She had a full work-up while she was hospitalized including CT angiogram which was negative for pulmonary embolism or other acute pathology.  She did not  qualify for home oxygen.  Orthostatics mildly positive.  Pulse 88 resting, 100 with standing.  Patient with no drop in blood pressure.  Hemoglobin checked again given her 12-hour wait and it is stable at 9.3.  Hemoccult is negative. Troponin negative.  Suspect patient's measured hemoglobin at the office was spurious.  Her blood counts appear stable to her discharge on 7/15. She appears well with stable vitals, is tolerating PO and ambulatory. Hemoglobin is improved to previous. FOBT is negative.  She appears stable for outpatient followup with her PCP and specialists.  Return precaution discussed. Final Clinical Impression(s) / ED Diagnoses Final diagnoses:  Iron deficiency anemia due to chronic blood loss    Rx / DC Orders ED Discharge Orders    None       Fabrizzio Marcella, Annie Main, MD 05/09/20 [phone]

## 2020-05-09 NOTE — Discharge Instructions (Addendum)
Your blood counts today are stable. Hemoglobin is 9.3 today which is better than when you were discharged on July 15.  There is no evidence of continued bleeding in your stool.  We suspect the blood test from your doctor's office was not accurate. Follow-up with your doctor and specialist as scheduled.  Return to the ED with chest pain, shortness of breath, any other concerns.

## 2020-05-14 ENCOUNTER — Ambulatory Visit (INDEPENDENT_AMBULATORY_CARE_PROVIDER_SITE_OTHER): Payer: Medicare Other | Admitting: Internal Medicine

## 2020-05-14 ENCOUNTER — Other Ambulatory Visit: Payer: Self-pay

## 2020-05-14 ENCOUNTER — Encounter: Payer: Self-pay | Admitting: Internal Medicine

## 2020-05-14 VITALS — BP 118/77 | HR 95 | Temp 96.2°F | Ht 63.0 in | Wt 168.0 lb

## 2020-05-14 DIAGNOSIS — E785 Hyperlipidemia, unspecified: Secondary | ICD-10-CM

## 2020-05-14 DIAGNOSIS — I10 Essential (primary) hypertension: Secondary | ICD-10-CM

## 2020-05-14 DIAGNOSIS — D649 Anemia, unspecified: Secondary | ICD-10-CM | POA: Diagnosis not present

## 2020-05-14 DIAGNOSIS — R55 Syncope and collapse: Secondary | ICD-10-CM

## 2020-05-14 NOTE — Patient Instructions (Signed)
Medication Instructions:  Your Physician recommend you continue on your current medication as directed.    *If you need a refill on your cardiac medications before your next appointment, please call your pharmacy*   Lab Work: None   Testing/Procedures: Your physician has requested that you have an echocardiogram. Echocardiography is a painless test that uses sound waves to create images of your heart. It provides your doctor with information about the size and shape of your heart and how well your heart's chambers and valves are working. This procedure takes approximately one hour. There are no restrictions for this procedure. [address]    Follow-Up: At [facility], you and your health needs are our priority.  As part of our continuing mission to provide you with exceptional heart care, we have created designated Provider Care Teams.  These Care Teams include your primary Cardiologist (physician) and Advanced Practice Providers (APPs -  Physician Assistants and Nurse Practitioners) who all work together to provide you with the care you need, when you need it.  We recommend signing up for the patient portal called "MyChart".  Sign up information is provided on this After Visit Summary.  MyChart is used to connect with patients for Virtual Visits (Telemedicine).  Patients are able to view lab/test results, encounter notes, upcoming appointments, etc.  Non-urgent messages can be sent to your provider as well.   To learn more about what you can do with MyChart, go to [email or web address].    Your next appointment:   After Echocardiogram  The format for your next appointment:   In Person  Provider:   [name], MD

## 2020-05-14 NOTE — Progress Notes (Addendum)
Cardiology Office Note:    Date:  05/14/2020   ID:  Autumn Conley, DOB [birth year], MRN [id number]  PCP:  Autumn Coke, PA  Cardiologist:  No primary care provider on file.  Electrophysiologist:  None   Referring MD: Autumn Coke, PA   Chief Complaint: SOB, lightheaded  History of Present Illness:    Autumn Conley is a 74 y.o. female with a history of thyoma being followed by Oncology, Hx of Pagets disease of breast, HTN, HLD, anxiety, IBS, NASH, fibromyalgia who presents for evaluation of generalized weakness, dizziness, shortness of breath with exertion.   Orthostatic vital signs are normal in the office today.  She has had several episodes of feeling "woozy" with near syncope.  She sustained a fall 3 weeks ago, and on that same day had an episode of presyncope at bedtime where she had to hold onto the sink to avoid falling.  She has weakness and lightheadedness with walking.  She has had acute on chronic anemia in the setting of her malignancy.  Recently had severe anemia with GI evaluation suggesting erosive gastropathy and no definite bleeding source.  She has significant shortness of breath with minimal activity but denies chest pain or pressure.  This constellation of symptoms has been going on for approximately 1 month.  She has obstructive sleep apnea and uses a CPAP at night.  Past Medical History:  Diagnosis Date   Anemia    Anxiety    Depression    Dyspnea    Fibromyalgia    "some; not chronic" (11/29/2014)   GERD (gastroesophageal reflux disease)    Glaucoma of both eyes    Headache    Hypertension    IBS (irritable bowel syndrome)    Mitral valve prolapse    NASH (nonalcoholic steatohepatitis)    Osteoarthritis    PONV (postoperative nausea and vomiting)    Stroke ([place])    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[facility])     Past Surgical History:  Procedure Laterality Date   [place]   BIOPSY  05/03/2020   Procedure:  BIOPSY;  Surgeon: Autumn Mayer, MD;  Location: [place];  Service: Endoscopy;;   BREAST BIOPSY Left    BREAST LUMPECTOMY Left    CATARACT EXTRACTION, BILATERAL  2018   COLONOSCOPY WITH PROPOFOL N/A 05/03/2020   Procedure: COLONOSCOPY WITH PROPOFOL;  Surgeon: Autumn Mayer, MD;  Location: [place];  Service: Endoscopy;  Laterality: N/A;   ESOPHAGOGASTRODUODENOSCOPY (EGD) WITH PROPOFOL N/A 05/03/2020   Procedure: ESOPHAGOGASTRODUODENOSCOPY (EGD) WITH PROPOFOL;  Surgeon: Autumn Mayer, MD;  Location: [place];  Service: Endoscopy;  Laterality: N/A;   MASTECTOMY Left ~ 2009   PARASTERNAL EXPLORATION Right 02/14/2019   Procedure: PARASTERNAL MEDIAL EXPLORATION WITH BIOPIES.;  Surgeon: Autumn Isaac, MD;  Location: [place];  Service: Thoracic;  Laterality: Right;   POLYPECTOMY  05/03/2020   Procedure: POLYPECTOMY;  Surgeon: Autumn Mayer, MD;  Location: [facility] ENDOSCOPY;  Service: Endoscopy;;    Current Medications: No outpatient medications have been marked as taking for the 05/14/20 encounter (Appointment) with [name], MD.     Allergies:   Latex   Social History   Socioeconomic History   Marital status: Married    Spouse name: Not on file   Number of children: 1   Years of education: 14   Highest education level: Not on file  Occupational History   Occupation: Retired  Tobacco Use   Smoking status: Never Smoker  Smokeless tobacco: Never Used  Vaping Use   Vaping Use: Never used  Substance and Sexual Activity   Alcohol use: No   Drug use: No   Sexual activity: Not Currently  Other Topics Concern   Not on file  Social History Narrative   Lives at home with her husband.   Right-handed.   2 cups caffeine/day.   Social Determinants of Health   Financial Resource Strain:    Difficulty of Paying Living Expenses:   Food Insecurity:    Worried About Charity fundraiser in the Last Year:    Arboriculturist in the Last Year:     Transportation Needs:    Film/video editor (Medical):    Lack of Transportation (Non-Medical):   Physical Activity:    Days of Exercise per Week:    Minutes of Exercise per Session:   Stress:    Feeling of Stress :   Social Connections:    Frequency of Communication with Friends and Family:    Frequency of Social Gatherings with Friends and Family:    Attends Religious Services:    Active Member of Clubs or Organizations:    Attends Music therapist:    Marital Status:      Family History: The patient's family history includes Lung cancer (age of onset: 81) in her mother; Prostate cancer (age of onset: 53) in her brother.  ROS:   Please see the history of present illness.    All other systems reviewed and are negative.  EKGs/Labs/Other Studies Reviewed:    The following studies were reviewed today:  EKG:  NSR, possible LAE  Recent Labs: 05/01/2020: TSH 2.14 05/03/2020: Magnesium 2.0 05/08/2020: ALT 63; BUN 17; Creatinine, Ser 0.90; Platelets 246; Potassium 4.1; Sodium 134 05/09/2020: Hemoglobin 9.3  Recent Lipid Panel    Component Value Date/Time   CHOL 181 02/27/2020 0932   TRIG 159.0 (H) 02/27/2020 0932   HDL 38.60 (L) 02/27/2020 0932   CHOLHDL 5 02/27/2020 0932   VLDL 31.8 02/27/2020 0932   LDLCALC 111 (H) 02/27/2020 0932    Physical Exam:    VS:  BP 118/77 (BP Location: Right Arm, Patient Position: Standing, Cuff Size: Large)    Pulse 95    Temp (!) 96.2 F (35.7 C)    Ht 5' 3"  (1.6 m)    Wt 168 lb (76.2 kg)    SpO2 97%    BMI 29.76 kg/m     Wt Readings from Last 5 Encounters:  05/08/20 167 lb 12.8 oz (76.1 kg)  05/08/20 167 lb 12.8 oz (76.1 kg)  05/03/20 167 lb 6.4 oz (75.9 kg)  05/01/20 167 lb 6.4 oz (75.9 kg)  03/05/20 166 lb 3.2 oz (75.4 kg)     Constitutional: No acute distress Eyes: sclera non-icteric, normal conjunctiva and lids ENMT: normal dentition, moist mucous membranes Cardiovascular: regular rhythm, normal  rate, no murmurs. S1 and S2 normal. Radial pulses normal bilaterally. No jugular venous distention.  Respiratory: clear to auscultation bilaterally GI : normal bowel sounds, soft and nontender. No distention.   MSK: extremities warm, well perfused. No edema.  NEURO: grossly nonfocal exam, moves all extremities. PSYCH: alert and oriented x 3, normal mood and affect.   ASSESSMENT:    1. Pre-syncope   2. Anemia, unspecified type   3. Essential hypertension   4. Hyperlipidemia, unspecified hyperlipidemia type    PLAN:    We reviewed her medical history in great detail with her daughter present  in the room.  We reviewed her past treatment for her thymoma as well as her recent work-up for anemia and GI evaluation.   Her symptoms all sound consistent with anemia.  We will ever evaluate for a structural cardiovascular cause with echocardiogram. I have encouraged her to reach out to her Hem/Onc physician to discuss anemia further since her symptoms will likely persist if anemia remains severe.  I have independently reviewed the images from CT angio chest which demonstrates significant mitral annular calcifications, moderate AV calcifications, and coronary artery calcifications. Echo will help determine if valvular heart disease is contributing to symptoms in setting of anemia.   Total time of encounter: 45 minutes total time of encounter, including 30 minutes spent in face-to-face patient care on the date of this encounter. This time includes coordination of care and counseling regarding above mentioned problem list. Remainder of non-face-to-face time involved reviewing chart documents/testing relevant to the patient encounter and documentation in the medical record. I have independently reviewed documentation from referring provider.   [name], MD [place]   [facility]    Medication Adjustments/Labs and Tests Ordered: Current medicines are reviewed at length with the patient  today.  Concerns regarding medicines are outlined above.  Orders Placed This Encounter  Procedures   EKG 12-Lead   ECHOCARDIOGRAM COMPLETE   No orders of the defined types were placed in this encounter.   Patient Instructions  Medication Instructions:  Your Physician recommend you continue on your current medication as directed.    *If you need a refill on your cardiac medications before your next appointment, please call your pharmacy*   Lab Work: None   Testing/Procedures: Your physician has requested that you have an echocardiogram. Echocardiography is a painless test that uses sound waves to create images of your heart. It provides your doctor with information about the size and shape of your heart and how well your hearts chambers and valves are working. This procedure takes approximately one hour. There are no restrictions for this procedure. [place] [address]    Follow-Up: At [facility], you and your health needs are our priority.  As part of our continuing mission to provide you with exceptional heart care, we have created designated Provider Care Teams.  These Care Teams include your primary Cardiologist (physician) and Advanced Practice Providers (APPs -  Physician Assistants and Nurse Practitioners) who all work together to provide you with the care you need, when you need it.  We recommend signing up for the patient portal called "MyChart".  Sign up information is provided on this After Visit Summary.  MyChart is used to connect with patients for Virtual Visits (Telemedicine).  Patients are able to view lab/test results, encounter notes, upcoming appointments, etc.  Non-urgent messages can be sent to your provider as well.   To learn more about what you can do with MyChart, go to [email or web address].    Your next appointment:   After Echocardiogram  The format for your next appointment:   In Person  Provider:   [name],  MD     Note edited 06/16/20 to correct spelling error of no clinical significance.

## 2020-05-16 ENCOUNTER — Encounter: Payer: Self-pay | Admitting: Physician Assistant

## 2020-05-16 DIAGNOSIS — M1990 Unspecified osteoarthritis, unspecified site: Secondary | ICD-10-CM | POA: Insufficient documentation

## 2020-05-22 ENCOUNTER — Ambulatory Visit: Payer: Medicare Other | Admitting: Physician Assistant

## 2020-05-23 ENCOUNTER — Ambulatory Visit (INDEPENDENT_AMBULATORY_CARE_PROVIDER_SITE_OTHER): Payer: Medicare Other | Admitting: Physician Assistant

## 2020-05-23 ENCOUNTER — Other Ambulatory Visit: Payer: Self-pay

## 2020-05-23 ENCOUNTER — Encounter: Payer: Self-pay | Admitting: Physician Assistant

## 2020-05-23 VITALS — BP 118/70 | HR 92 | Temp 98.0°F | Ht 63.0 in | Wt 169.0 lb

## 2020-05-23 DIAGNOSIS — D649 Anemia, unspecified: Secondary | ICD-10-CM

## 2020-05-23 LAB — CBC WITH DIFFERENTIAL/PLATELET
Absolute Monocytes: 540 cells/uL (ref 200–950)
Basophils Absolute: 60 cells/uL (ref 0–200)
Basophils Relative: 1.5 %
Eosinophils Absolute: 72 cells/uL (ref 15–500)
Eosinophils Relative: 1.8 %
HCT: 27.7 % — ABNORMAL LOW (ref 35.0–45.0)
Hemoglobin: 8.8 g/dL — ABNORMAL LOW (ref 11.7–15.5)
Lymphs Abs: 660 cells/uL — ABNORMAL LOW (ref 850–3900)
MCH: 27 pg (ref 27.0–33.0)
MCHC: 31.8 g/dL — ABNORMAL LOW (ref 32.0–36.0)
MCV: 85 fL (ref 80.0–100.0)
MPV: 9.7 fL (ref 7.5–12.5)
Monocytes Relative: 13.5 %
Neutro Abs: 2668 cells/uL (ref 1500–7800)
Neutrophils Relative %: 66.7 %
Platelets: 254 10*3/uL (ref 140–400)
RBC: 3.26 10*6/uL — ABNORMAL LOW (ref 3.80–5.10)
RDW: 14.9 % (ref 11.0–15.0)
Total Lymphocyte: 16.5 %
WBC: 4 10*3/uL (ref 3.8–10.8)

## 2020-05-23 NOTE — Patient Instructions (Signed)
It was great to see you!  We will be in touch with your lab results.  We have reached out to Dr. Heron Sabins office about scheduling you an appointment.  Take care,  [name] PA-C

## 2020-05-23 NOTE — Progress Notes (Signed)
Autumn Conley is a 74 y.o. female is here to for a ED follow up.  I acted as a Education administrator for [employer], PA-C Anselmo Pickler, LPN   History of Present Illness:   Chief Complaint  Patient presents with  . Follow-up    HPI  Symptomatic anemia Hospitalized 7/13-7/21. See my hospital follow-up note on 05/08/20 for this. During my 05/08/20 visit a POC hgb was obtained, was 6.6, and she was sent to the ER. Labs repeated and showed hgb of 8.9. She was told to follow-up with [place].  She saw cardiology, since that time and they have recommended an echo -- this will be done next week. They feel that her symptoms are related to her symptomatic anemia.  She has not had significant contact with her oncologist regarding these symptomatic anemia episodes. She has had at least two episodes since she was seen in the ED of dizziness/woozy/unbalanced.  There are no preventive care reminders to display for this patient.  Past Medical History:  Diagnosis Date  . Anemia   . Anxiety   . Depression   . Dyspnea   . Fibromyalgia    "some; not chronic" (11/29/2014)  . GERD (gastroesophageal reflux disease)   . Glaucoma of both eyes   . Headache   . Hypertension   . IBS (irritable bowel syndrome)   . Mitral valve prolapse   . NASH (nonalcoholic steatohepatitis)   . Osteoarthritis   . PONV (postoperative nausea and vomiting)   . Stroke ([place])   . 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[facility])      Social History   Tobacco Use  . Smoking status: Never Smoker  . Smokeless tobacco: Never Used  Vaping Use  . Vaping Use: Never used  Substance Use Topics  . Alcohol use: No  . Drug use: No    Past Surgical History:  Procedure Laterality Date  . ABDOMINAL HYSTERECTOMY  1980  . APPENDECTOMY  1953  . BIOPSY  05/03/2020   Procedure: BIOPSY;  Surgeon: Gatha Mayer, MD;  Location: [place];  Service: Endoscopy;;  . BREAST BIOPSY Left   . BREAST LUMPECTOMY Left   . CATARACT EXTRACTION, BILATERAL  2018  . COLONOSCOPY  WITH PROPOFOL N/A 05/03/2020   Procedure: COLONOSCOPY WITH PROPOFOL;  Surgeon: Gatha Mayer, MD;  [facility] ENDOSCOPY;  Service: Endoscopy;  Laterality: N/A;  . ESOPHAGOGASTRODUODENOSCOPY (EGD) WITH PROPOFOL N/A 05/03/2020   Procedure: ESOPHAGOGASTRODUODENOSCOPY (EGD) WITH PROPOFOL;  Surgeon: Gatha Mayer, MD;  Location: [place];  Service: Endoscopy;  Laterality: N/A;  . MASTECTOMY Left ~ 2009  . PARASTERNAL EXPLORATION Right 02/14/2019   Procedure: PARASTERNAL MEDIAL EXPLORATION WITH BIOPIES.;  Surgeon: [name], MD;  Location: [place];  Service: Thoracic;  Laterality: Right;  . POLYPECTOMY  05/03/2020   Procedure: POLYPECTOMY;  Surgeon: Gatha Mayer, MD;  Location: [facility] ENDOSCOPY;  Service: Endoscopy;;    Family History  Problem Relation Age of Onset  . Lung cancer Mother 62  . Prostate cancer Brother 39    PMHx, SurgHx, SocialHx, FamHx, Medications, and Allergies were reviewed in the Visit Navigator and updated as appropriate.   Patient Active Problem List   Diagnosis Date Noted  . Inflammatory arthritis 05/16/2020  . Erosive gastritis   . Benign neoplasm of ascending colon   . Heme + stool   . Change in bowel habit   . Anemia 05/01/2020  . Dyspnea 05/01/2020  . Generalized weakness 05/01/2020  . UTI (urinary tract infection) 05/01/2020  .  Symptomatic anemia 05/01/2020  . Glaucoma of both eyes   . NASH (nonalcoholic steatohepatitis)   . Mitral valve prolapse   . Thymoma -- managed by [facility] - extensive documentation in Care Everywhere 03/22/2019  . Breast cancer ([place]) 03/18/2019  . Mass of upper lobe of right lung 01/20/2019  . B12 deficiency 12/19/2018  . Stress incontinence in female 12/19/2018  . Dyslipidemia 12/14/2018  . OSA (obstructive sleep apnea) 12/07/2018  . History of breast cancer 12/07/2018  . Cognitive complaints 11/25/2018  . False positive stress test 01/29/2015  . Migraine without aura and without status migrainosus, not intractable  12/18/2014  . Small vessel disease, cerebrovascular 12/18/2014  . GERD (gastroesophageal reflux disease) 12/02/2014  . HTN (hypertension) 12/27/2013  . Anxiety 12/27/2013  . Depression 11/03/2012  . Elevated LFTs 11/03/2012  . Fibromyalgia 11/03/2012  . IBS (irritable bowel syndrome) 11/03/2012  . Lymphocytic colitis 11/03/2012  . Paget's disease of nipple ([place]) 11/03/2012  . Vitamin D deficiency 11/03/2012  . Osteoarthritis 11/03/2012    Social History   Tobacco Use  . Smoking status: Never Smoker  . Smokeless tobacco: Never Used  Vaping Use  . Vaping Use: Never used  Substance Use Topics  . Alcohol use: No  . Drug use: No    Current Medications and Allergies:    Current Outpatient Medications:  .  ALPRAZolam (XANAX) 0.25 MG tablet, Take 0.5 tablets (0.125 mg total) by mouth daily as needed for anxiety., Disp: 30 tablet, Rfl: 0 .  Ascorbic Acid (VITAMIN C) 1000 MG tablet, Take 1,000 mg by mouth daily., Disp: , Rfl:  .  atorvastatin (LIPITOR) 40 MG tablet, TAKE 1 TABLET BY MOUTH AT  BEDTIME (Patient taking differently: Take 40 mg by mouth at bedtime. ), Disp: 90 tablet, Rfl: 3 .  Cholecalciferol (VITAMIN D-3 PO), Take 1 capsule by mouth daily., Disp: , Rfl:  .  cyanocobalamin (,VITAMIN B-12,) 1000 MCG/ML injection, Inject 1 mL (1,000 mcg total) into the muscle every 30 (thirty) days., Disp: 3 mL, Rfl: 2 .  gabapentin (NEURONTIN) 300 MG capsule, Take 300 mg by mouth 3 (three) times daily. , Disp: , Rfl:  .  irbesartan (AVAPRO) 150 MG tablet, TAKE 1 TABLET BY MOUTH  DAILY (Patient taking differently: Take 150 mg by mouth in the morning. ), Disp: 90 tablet, Rfl: 3 .  ondansetron (ZOFRAN) 8 MG tablet, Take 8 mg by mouth every 8 (eight) hours as needed for nausea or vomiting., Disp: , Rfl:  .  pantoprazole (PROTONIX) 40 MG tablet, Take 1 tablet (40 mg total) by mouth daily before breakfast., Disp: 30 tablet, Rfl: 0 .  PARoxetine (PAXIL) 20 MG tablet, TAKE 3 TABLETS BY MOUTH IN  THE  MORNING (Patient taking differently: Take 60 mg by mouth in the morning. ), Disp: 270 tablet, Rfl: 3 .  polyethylene glycol powder (GLYCOLAX/MIRALAX) 17 GM/SCOOP powder, Take 17 g by mouth daily as needed for mild constipation., Disp: , Rfl:  .  SUMAtriptan (IMITREX) 100 MG tablet, Take 1 tablet (100 mg total) by mouth 2 (two) times daily as needed. At least 2 hrs between doses as needed bid (Patient taking differently: Take 100 mg by mouth 2 (two) times daily as needed for migraine or headache (and may repeat once in 2 hours, if no relief). ), Disp: 10 tablet, Rfl: 3 .  SYRINGE-NEEDLE, DISP, 3 ML 25G X 1" 3 ML MISC, Use to inject Vit B12 once a month., Disp: 12 each, Rfl: 1 .  valACYclovir (VALTREX)  1000 MG tablet, TAKE 2 TABLETS BY MOUTH  EVERY 12 HOURS FOR 1 DAY;  START: ASAP AFTER SYMPTOM  ONSET, Disp: 12 tablet, Rfl: 3 .  vitamin E (VITAMIN E) 180 MG (400 UNITS) capsule, Take 400 Units by mouth daily., Disp: , Rfl:    Allergies  Allergen Reactions  . Latex Rash and Other (See Comments)    Blisters, also    Review of Systems   ROS Negative unless otherwise specified per HPI.  Vitals:   Vitals:   05/23/20 1005  BP: 118/70  Pulse: 92  Temp: 98 F (36.7 C)  TempSrc: Temporal  SpO2: 96%  Weight: 169 lb (76.7 kg)  Height: 5' 3"  (1.6 m)     Body mass index is 29.94 kg/m.   Physical Exam:    Physical Exam Vitals and nursing note reviewed.  Constitutional:      General: She is not in acute distress.    Appearance: She is well-developed. She is not ill-appearing or toxic-appearing.  Cardiovascular:     Rate and Rhythm: Normal rate and regular rhythm.     Pulses: Normal pulses.     Heart sounds: Normal heart sounds, S1 normal and S2 normal.     Comments: No LE edema Pulmonary:     Effort: Pulmonary effort is normal.     Breath sounds: Normal breath sounds.  Skin:    General: Skin is warm and dry.  Neurological:     Mental Status: She is alert.     GCS: GCS eye  subscore is 4. GCS verbal subscore is 5. GCS motor subscore is 6.  Psychiatric:        Speech: Speech normal.        Behavior: Behavior normal. Behavior is cooperative.      Assessment and Plan:    Cale was seen today for follow-up.  Diagnoses and all orders for this visit:  Symptomatic anemia -     CBC with Differential/Platelet; Future -     CBC with Differential/Platelet   Discussed at length. Reviewed ED note and cardiology note. We called and scheduled her an appointment for this upcoming Monday 8/9 to meet with her oncologist regarding this. Update CBC today.  . Reviewed expectations re: course of current medical issues. . Discussed self-management of symptoms. . Outlined signs and symptoms indicating need for more acute intervention. . Patient verbalized understanding and all questions were answered. . See orders for this visit as documented in the electronic medical record. . Patient received an After Visit Summary.  CMA or LPN served as scribe during this visit. History, Physical, and Plan performed by medical provider. The above documentation has been reviewed and is accurate and complete.  Time spent with patient today was 25 minutes which consisted of chart review, discussing diagnosis, work up, treatment answering questions and documentation.   [name], PA-C [place], [place] 05/23/2020  Follow-up: No follow-ups on file.

## 2020-05-28 ENCOUNTER — Other Ambulatory Visit: Payer: Self-pay

## 2020-05-28 ENCOUNTER — Ambulatory Visit (HOSPITAL_COMMUNITY): Payer: Medicare Other | Attending: Cardiology

## 2020-05-28 DIAGNOSIS — C7989 Secondary malignant neoplasm of other specified sites: Secondary | ICD-10-CM | POA: Diagnosis not present

## 2020-05-28 DIAGNOSIS — D513 Other dietary vitamin B12 deficiency anemia: Secondary | ICD-10-CM | POA: Diagnosis not present

## 2020-05-28 DIAGNOSIS — C782 Secondary malignant neoplasm of pleura: Secondary | ICD-10-CM | POA: Diagnosis not present

## 2020-05-28 DIAGNOSIS — D649 Anemia, unspecified: Secondary | ICD-10-CM | POA: Diagnosis not present

## 2020-05-28 DIAGNOSIS — Z923 Personal history of irradiation: Secondary | ICD-10-CM | POA: Diagnosis not present

## 2020-05-28 DIAGNOSIS — Z79899 Other long term (current) drug therapy: Secondary | ICD-10-CM | POA: Diagnosis not present

## 2020-05-28 DIAGNOSIS — C37 Malignant neoplasm of thymus: Secondary | ICD-10-CM | POA: Diagnosis not present

## 2020-05-28 DIAGNOSIS — K2970 Gastritis, unspecified, without bleeding: Secondary | ICD-10-CM | POA: Diagnosis not present

## 2020-05-28 DIAGNOSIS — G8918 Other acute postprocedural pain: Secondary | ICD-10-CM | POA: Diagnosis not present

## 2020-05-28 DIAGNOSIS — Z791 Long term (current) use of non-steroidal anti-inflammatories (NSAID): Secondary | ICD-10-CM | POA: Diagnosis not present

## 2020-05-28 DIAGNOSIS — D4989 Neoplasm of unspecified behavior of other specified sites: Secondary | ICD-10-CM | POA: Diagnosis not present

## 2020-05-28 DIAGNOSIS — R0789 Other chest pain: Secondary | ICD-10-CM | POA: Diagnosis not present

## 2020-05-28 DIAGNOSIS — R55 Syncope and collapse: Secondary | ICD-10-CM | POA: Insufficient documentation

## 2020-05-28 LAB — ECHOCARDIOGRAM COMPLETE
AR max vel: 2.75 cm2
AV Area VTI: 2.74 cm2
AV Area mean vel: 2.69 cm2
AV Mean grad: 12.3 mmHg
AV Peak grad: 16.9 mmHg
Ao pk vel: 2.06 m/s
Area-P 1/2: 3.42 cm2
P 1/2 time: 322 msec
S' Lateral: 2 cm

## 2020-06-15 ENCOUNTER — Ambulatory Visit (INDEPENDENT_AMBULATORY_CARE_PROVIDER_SITE_OTHER): Payer: Medicare Other | Admitting: Internal Medicine

## 2020-06-15 ENCOUNTER — Encounter: Payer: Self-pay | Admitting: Internal Medicine

## 2020-06-15 ENCOUNTER — Other Ambulatory Visit: Payer: Self-pay

## 2020-06-15 ENCOUNTER — Telehealth: Payer: Self-pay | Admitting: Radiology

## 2020-06-15 VITALS — BP 124/80 | HR 88 | Temp 96.8°F | Ht 63.0 in | Wt 171.6 lb

## 2020-06-15 DIAGNOSIS — D4989 Neoplasm of unspecified behavior of other specified sites: Secondary | ICD-10-CM | POA: Diagnosis not present

## 2020-06-15 DIAGNOSIS — E785 Hyperlipidemia, unspecified: Secondary | ICD-10-CM

## 2020-06-15 DIAGNOSIS — I10 Essential (primary) hypertension: Secondary | ICD-10-CM

## 2020-06-15 DIAGNOSIS — R55 Syncope and collapse: Secondary | ICD-10-CM

## 2020-06-15 DIAGNOSIS — R002 Palpitations: Secondary | ICD-10-CM

## 2020-06-15 DIAGNOSIS — K7581 Nonalcoholic steatohepatitis (NASH): Secondary | ICD-10-CM

## 2020-06-15 DIAGNOSIS — G4733 Obstructive sleep apnea (adult) (pediatric): Secondary | ICD-10-CM | POA: Diagnosis not present

## 2020-06-15 DIAGNOSIS — D649 Anemia, unspecified: Secondary | ICD-10-CM

## 2020-06-15 NOTE — Telephone Encounter (Signed)
Enrolled patient for a 14 day Zio XT  monitor to be mailed to patients home  °

## 2020-06-15 NOTE — Progress Notes (Signed)
Cardiology Office Note:    Date:  06/15/2020   ID:  Autumn Conley, DOB 1945-12-22, MRN [id number]  PCP:  [name], PA  Cardiologist:  No primary care provider on file.  Electrophysiologist:  None   Referring MD: [name], PA   Chief Complaint: presyncope, palpitations  History of Present Illness:    Autumn Conley is a 74 y.o. female with a history of metastatic thyoma being followed by Oncology, Hx of Pagets disease of breast, HTN, HLD, anxiety, IBS, NASH, fibromyalgia who presents for follow up of generalized weakness, dizziness, shortness of breath with exertion. Symptomatic anemia related to malignancy, now started on iron supplementation. May need IV iron per her report. Feeling slightly better on iron therapy. She has had one fall since we last spoke. [place] backwards into her car with presyncope.   She describes palpitations several times a week lasting minutes at a time that feel central, upper chest into her throat. She has no known history of afib per her report. Does not coincide with presyncopal episodes. We discussed that this could be secondary to anemia but we should evaluate for afib given complex medical history. No CP. Continued exertional SOB but improving.   Reviewed echocardiogram together today. Hyperdynamic LV function likely contributed to by anemia. Strain normal after chemotherapy. Calcified mitral and aortic valves without hemodynamically significant disease. Elevated gradient through AV likely due to flow.   Past Medical History:  Diagnosis Date   Anemia    Anxiety    Depression    Dyspnea    Fibromyalgia    "some; not chronic" (11/29/2014)   GERD (gastroesophageal reflux disease)    Glaucoma of both eyes    Headache    Hypertension    IBS (irritable bowel syndrome)    Mitral valve prolapse    NASH (nonalcoholic steatohepatitis)    Osteoarthritis    PONV (postoperative nausea and vomiting)    Stroke ([place])    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[facility])     Past Surgical History:  Procedure Laterality Date   [place]   BIOPSY  05/03/2020   Procedure: BIOPSY;  Surgeon: Gatha Mayer, MD;  Location: [place];  Service: Endoscopy;;   BREAST BIOPSY Left    BREAST LUMPECTOMY Left    CATARACT EXTRACTION, BILATERAL  2018   COLONOSCOPY WITH PROPOFOL N/A 05/03/2020   Procedure: COLONOSCOPY WITH PROPOFOL;  Surgeon: Gatha Mayer, MD;  Location: [place];  Service: Endoscopy;  Laterality: N/A;   ESOPHAGOGASTRODUODENOSCOPY (EGD) WITH PROPOFOL N/A 05/03/2020   Procedure: ESOPHAGOGASTRODUODENOSCOPY (EGD) WITH PROPOFOL;  Surgeon: Gatha Mayer, MD;  Location: [place];  Service: Endoscopy;  Laterality: N/A;   MASTECTOMY Left ~ 2009   PARASTERNAL EXPLORATION Right 02/14/2019   Procedure: PARASTERNAL MEDIAL EXPLORATION WITH BIOPIES.;  Surgeon: [name], MD;  Location: [place];  Service: Thoracic;  Laterality: Right;   POLYPECTOMY  05/03/2020   Procedure: POLYPECTOMY;  Surgeon: Gatha Mayer, MD;  [facility] END[facility]COPY;  Service: Endoscopy;;    Current Medications: Current Meds  Medication Sig   ALPRAZolam (XANAX) 0.25 MG tablet Take 0.5 tablets (0.125 mg total) by mouth daily as needed for anxiety.   Ascorbic Acid (VITAMIN C) 1000 MG tablet Take 1,000 mg by mouth daily.   atorvastatin (LIPITOR) 40 MG tablet TAKE 1 TABLET BY MOUTH AT  BEDTIME (Patient taking differently: Take 40 mg by mouth at bedtime. )   Cholecalciferol (VITAMIN D-3 PO) Take 1 capsule by  mouth daily.   cyanocobalamin (,VITAMIN B-12,) 1000 MCG/ML injection Inject 1 mL (1,000 mcg total) into the muscle every 30 (thirty) days.   ferrous sulfate 325 (65 FE) MG tablet Take by mouth.   gabapentin (NEURONTIN) 300 MG capsule Take 300 mg by mouth 3 (three) times daily.    irbesartan (AVAPRO) 150 MG tablet TAKE 1 TABLET BY MOUTH  DAILY (Patient taking differently: Take 150 mg by mouth in the morning. )     ondansetron (ZOFRAN) 8 MG tablet Take 8 mg by mouth every 8 (eight) hours as needed for nausea or vomiting.   pantoprazole (PROTONIX) 40 MG tablet Take 1 tablet (40 mg total) by mouth daily before breakfast.   PARoxetine (PAXIL) 20 MG tablet TAKE 3 TABLETS BY MOUTH IN  THE MORNING (Patient taking differently: Take 60 mg by mouth in the morning. )   polyethylene glycol powder (GLYCOLAX/MIRALAX) 17 GM/SCOOP powder Take 17 g by mouth daily as needed for mild constipation.   SUMAtriptan (IMITREX) 100 MG tablet Take 1 tablet (100 mg total) by mouth 2 (two) times daily as needed. At least 2 hrs between doses as needed bid (Patient taking differently: Take 100 mg by mouth 2 (two) times daily as needed for migraine or headache (and may repeat once in 2 hours, if no relief). )   SYRINGE-NEEDLE, DISP, 3 ML 25G X 1" 3 ML MISC Use to inject Vit B12 once a month.   valACYclovir (VALTREX) 1000 MG tablet TAKE 2 TABLETS BY MOUTH  EVERY 12 HOURS FOR 1 DAY;  START: ASAP AFTER SYMPTOM  ONSET   vitamin E (VITAMIN E) 180 MG (400 UNITS) capsule Take 400 Units by mouth daily.     Allergies:   Latex   Social History   Socioeconomic History   Marital status: Married    Spouse name: Not on file   Number of children: 1   Years of education: 14   Highest education level: Not on file  Occupational History   Occupation: Retired  Tobacco Use   Smoking status: Never Smoker   Smokeless tobacco: Never Used  Scientific laboratory technician Use: Never used  Substance and Sexual Activity   Alcohol use: No   Drug use: No   Sexual activity: Not Currently  Other Topics Concern   Not on file  Social History Narrative   Lives at home with her husband.   Right-handed.   2 cups caffeine/day.   Social Determinants of Health   Financial Resource Strain:    Difficulty of Paying Living Expenses: Not on file  Food Insecurity:    Worried About Charity fundraiser in the Last Year: Not on file   [employer] of  Food in the Last Year: Not on file  Transportation Needs:    Lack of Transportation (Medical): Not on file   Lack of Transportation (Non-Medical): Not on file  Physical Activity:    Days of Exercise per Week: Not on file   Minutes of Exercise per Session: Not on file  Stress:    Feeling of Stress : Not on file  Social Connections:    Frequency of Communication with Friends and Family: Not on file   Frequency of Social Gatherings with Friends and Family: Not on file   Attends Religious Services: Not on file   Active Member of Clubs or Organizations: Not on file   Attends Archivist Meetings: Not on file   Marital Status: Not on file  Family History: The patient's family history includes Lung cancer (age of onset: 71) in her mother; Prostate cancer (age of onset: 83) in her brother.  ROS:   Please see the history of present illness.    All other systems reviewed and are negative.  EKGs/Labs/Other Studies Reviewed:    The following studies were reviewed today:  Recent Labs: 05/01/2020: TSH 2.14 05/03/2020: Magnesium 2.0 05/08/2020: ALT 63; BUN 17; Creatinine, Ser 0.90; Potassium 4.1; Sodium 134 05/23/2020: Hemoglobin 8.8; Platelets 254  Recent Lipid Panel    Component Value Date/Time   CHOL 181 02/27/2020 0932   TRIG 159.0 (H) 02/27/2020 0932   HDL 38.60 (L) 02/27/2020 0932   CHOLHDL 5 02/27/2020 0932   VLDL 31.8 02/27/2020 0932   LDLCALC 111 (H) 02/27/2020 0932    Physical Exam:    VS:  BP 124/80    Pulse 88    Temp (!) 96.8 F (36 C)    Ht 5' 3"  (1.6 m)    Wt 171 lb 9.6 oz (77.8 kg)    SpO2 95%    BMI 30.40 kg/m     Wt Readings from Last 5 Encounters:  06/15/20 171 lb 9.6 oz (77.8 kg)  05/23/20 169 lb (76.7 kg)  05/14/20 168 lb (76.2 kg)  05/08/20 167 lb 12.8 oz (76.1 kg)  05/08/20 167 lb 12.8 oz (76.1 kg)     Constitutional: No acute distress Eyes: sclera non-icteric, normal conjunctiva and lids ENMT: normal dentition, moist mucous  membranes Cardiovascular: regular rhythm, normal rate, 3/6 SEM. S1 and S2 normal. Radial pulses normal bilaterally. No jugular venous distention.  Respiratory: clear to auscultation bilaterally GI : normal bowel sounds, soft and nontender. No distention.   MSK: extremities warm, well perfused. No edema.  NEURO: grossly nonfocal exam, moves all extremities. PSYCH: alert and oriented x 3, normal mood and affect.   ASSESSMENT:    1. Palpitations   2. Pre-syncope   3. Anemia, unspecified type   4. Essential hypertension   5. Hyperlipidemia, unspecified hyperlipidemia type   6. Thymoma -- managed by [facility] - extensive documentation in Care Everywhere   7. NASH (nonalcoholic steatohepatitis)   8. OSA (obstructive sleep apnea)    PLAN:    Palpitations - need to rule out afib, will perform 2 week zio monitor.   Presyncope - we discussed adequate hydration and treatment of anemia. Will monitor for improvements. Will also evaluate with cardiac monitor.   HLD - on atorvastatin   Anemia - on oral iron. Hb 9 on 05/28/20. No definite bleeding.  HTN - bp well controlled today, can continue irbesartan.   Thymoma - followed by Dr. Mindi Junker [facility].  OSA - continue CPAP.   Total time of encounter: 30 minutes total time of encounter, including 25 minutes spent in face-to-face patient care on the date of this encounter. This time includes coordination of care and counseling regarding above mentioned problem list. Remainder of non-face-to-face time involved reviewing chart documents/testing relevant to the patient encounter and documentation in the medical record. I have independently reviewed documentation from referring provider.   [name], MD [place]   [facility]    Medication Adjustments/Labs and Tests Ordered: Current medicines are reviewed at length with the patient today.  Concerns regarding medicines are outlined above.  Orders Placed This Encounter  Procedures   LONG TERM  MONITOR (3-14 DAYS)   No orders of the defined types were placed in this encounter.   Patient Instructions  Medication Instructions:  Your Physician recommend you continue on your current medication as directed.    *If you need a refill on your cardiac medications before your next appointment, please call your pharmacy*   Lab Work: None   Testing/Procedures: Our physician has recommended that you wear an [place] monitor. The Zio patch cardiac monitor continuously records heart rhythm data for up to 14 days, this is for patients being evaluated for multiple types heart rhythms. For the first 24 hours post application, please avoid getting the Zio monitor wet in the shower or by excessive sweating during exercise. After that, feel free to carry on with regular activities. Keep soaps and lotions away from the ZIO XT Patch.   Someone from our office will call to verify address and mail monitor.     Follow-Up: At [facility], you and your health needs are our priority.  As part of our continuing mission to provide you with exceptional heart care, we have created designated Provider Care Teams.  These Care Teams include your primary Cardiologist (physician) and Advanced Practice Providers (APPs -  Physician Assistants and Nurse Practitioners) who all work together to provide you with the care you need, when you need it.  We recommend signing up for the patient portal called "MyChart".  Sign up information is provided on this After Visit Summary.  MyChart is used to connect with patients for Virtual Visits (Telemedicine).  Patients are able to view lab/test results, encounter notes, upcoming appointments, etc.  Non-urgent messages can be sent to your provider as well.   To learn more about what you can do with MyChart, go to [email or web address].    Your next appointment:   3 month(s)  The format for your next appointment:   In Person  Provider:   [name],  MD

## 2020-06-15 NOTE — Patient Instructions (Signed)
Medication Instructions:  Your Physician recommend you continue on your current medication as directed.    *If you need a refill on your cardiac medications before your next appointment, please call your pharmacy*   Lab Work: None   Testing/Procedures: Our physician has recommended that you wear an [place] monitor. The Zio patch cardiac monitor continuously records heart rhythm data for up to 14 days, this is for patients being evaluated for multiple types heart rhythms. For the first 24 hours post application, please avoid getting the Zio monitor wet in the shower or by excessive sweating during exercise. After that, feel free to carry on with regular activities. Keep soaps and lotions away from the ZIO XT Patch.   Someone from our office will call to verify address and mail monitor.     Follow-Up: At [facility], you and your health needs are our priority.  As part of our continuing mission to provide you with exceptional heart care, we have created designated Provider Care Teams.  These Care Teams include your primary Cardiologist (physician) and Advanced Practice Providers (APPs -  Physician Assistants and Nurse Practitioners) who all work together to provide you with the care you need, when you need it.  We recommend signing up for the patient portal called "MyChart".  Sign up information is provided on this After Visit Summary.  MyChart is used to connect with patients for Virtual Visits (Telemedicine).  Patients are able to view lab/test results, encounter notes, upcoming appointments, etc.  Non-urgent messages can be sent to your provider as well.   To learn more about what you can do with MyChart, go to [email or web address].    Your next appointment:   3 month(s)  The format for your next appointment:   In Person  Provider:   [name], MD

## 2020-06-20 ENCOUNTER — Other Ambulatory Visit (INDEPENDENT_AMBULATORY_CARE_PROVIDER_SITE_OTHER): Payer: Medicare Other

## 2020-06-20 DIAGNOSIS — R002 Palpitations: Secondary | ICD-10-CM | POA: Diagnosis not present

## 2020-06-22 ENCOUNTER — Ambulatory Visit: Payer: Medicare Other | Admitting: Interventional Cardiology

## 2020-06-26 DIAGNOSIS — D4989 Neoplasm of unspecified behavior of other specified sites: Secondary | ICD-10-CM | POA: Diagnosis not present

## 2020-06-26 DIAGNOSIS — D508 Other iron deficiency anemias: Secondary | ICD-10-CM | POA: Diagnosis not present

## 2020-06-26 DIAGNOSIS — Z9221 Personal history of antineoplastic chemotherapy: Secondary | ICD-10-CM | POA: Diagnosis not present

## 2020-06-26 DIAGNOSIS — C381 Malignant neoplasm of anterior mediastinum: Secondary | ICD-10-CM | POA: Diagnosis not present

## 2020-06-26 DIAGNOSIS — C37 Malignant neoplasm of thymus: Secondary | ICD-10-CM | POA: Diagnosis not present

## 2020-06-28 ENCOUNTER — Other Ambulatory Visit: Payer: Self-pay

## 2020-06-28 ENCOUNTER — Encounter: Payer: Self-pay | Admitting: Internal Medicine

## 2020-06-28 ENCOUNTER — Telehealth: Payer: Self-pay | Admitting: Physician Assistant

## 2020-06-28 ENCOUNTER — Ambulatory Visit (INDEPENDENT_AMBULATORY_CARE_PROVIDER_SITE_OTHER): Payer: Medicare Other | Admitting: Internal Medicine

## 2020-06-28 VITALS — BP 118/60 | HR 93 | Temp 98.8°F | Ht 63.0 in | Wt 169.0 lb

## 2020-06-28 DIAGNOSIS — N390 Urinary tract infection, site not specified: Secondary | ICD-10-CM | POA: Diagnosis not present

## 2020-06-28 DIAGNOSIS — N393 Stress incontinence (female) (male): Secondary | ICD-10-CM | POA: Diagnosis not present

## 2020-06-28 MED ORDER — CEPHALEXIN 500 MG PO CAPS: ORAL_CAPSULE | ORAL | 0 refills | Status: DC

## 2020-06-28 MED ORDER — CIPROFLOXACIN HCL 500 MG PO TABS: 500.0000 mg | ORAL_TABLET | Freq: Two times a day (BID) | ORAL | 0 refills | Status: AC

## 2020-06-28 MED ORDER — CEFTRIAXONE SODIUM 1 G IJ SOLR
1.0000 g | Freq: Once | INTRAMUSCULAR | Status: AC
  Administered 2020-06-28: 1 g via INTRAMUSCULAR

## 2020-06-28 NOTE — Telephone Encounter (Signed)
Autumn Conley is out of the office today, pt will need to schedule an appointment with another provider for UTI symptoms can not just drop off urine.

## 2020-06-28 NOTE — Progress Notes (Signed)
Subjective:    Patient ID: Autumn Conley, female    DOB: 09/29/1946, 74 y.o.   MRN: [id number]  HPI  Here with family, c/o 3 days dysuria and Denies urinary symptoms such as frequency, urgency, flank pain, hematuria or n/v, fever, chills.  Also has stress incontinence with cough.  Also this is 3rd or 4th uti in just a few months. Pt denies chest pain, increased sob or doe, wheezing, orthopnea, PND, increased LE swelling, palpitations, dizziness or syncope.  Pt denies new neurological symptoms such as new headache, or facial or extremity weakness or numbness   Pt denies polydipsia, polyuria, Past Medical History:  Diagnosis Date  . Anemia   . Anxiety   . Depression   . Dyspnea   . Fibromyalgia    "some; not chronic" (11/29/2014)  . GERD (gastroesophageal reflux disease)   . Glaucoma of both eyes   . Headache   . Hypertension   . IBS (irritable bowel syndrome)   . Mitral valve prolapse   . NASH (nonalcoholic steatohepatitis)   . Osteoarthritis   . PONV (postoperative nausea and vomiting)   . Stroke ([place])   . 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[facility])    Past Surgical History:  Procedure Laterality Date  . ABDOMINAL HYSTERECTOMY  1980  . APPENDECTOMY  1953  . BIOPSY  05/03/2020   Procedure: BIOPSY;  Surgeon: Gatha Mayer, MD;  Location: [place];  Service: Endoscopy;;  . BREAST BIOPSY Left   . BREAST LUMPECTOMY Left   . CATARACT EXTRACTION, BILATERAL  2018  . COLONOSCOPY WITH PROPOFOL N/A 05/03/2020   Procedure: COLONOSCOPY WITH PROPOFOL;  Surgeon: Gatha Mayer, MD;  Location[facility] ENDOSCOPY;  Service: Endoscopy;  Laterality: N/A;  . ESOPHAGOGASTRODUODENOSCOPY (EGD) WITH PROPOFOL N/A 05/03/2020   Procedure: ESOPHAGOGASTRODUODENOSCOPY (EGD) WITH PROPOFOL;  Surgeon: Gatha Mayer, MD;  Location: [place];  Service: Endoscopy;  Laterality: N/A;  . MASTECTOMY Left ~ 2009  . PARASTERNAL EXPLORATION Right 02/14/2019   Procedure: PARASTERNAL MEDIAL EXPLORATION WITH BIOPIES.;  Surgeon: [name], MD;  Location: [place];  Service: Thoracic;  Laterality: Right;  . POLYPECTOMY  05/03/2020   Procedure: POLYPECTOMY;  Surgeon: Gatha Mayer, MD;  Location: [facility] ENDOSCOPY;  Service: Endoscopy;;    reports that she has never smoked. She has never used smokeless tobacco. She reports that she does not drink alcohol and does not use drugs. family history includes Lung cancer (age of onset: 20) in her mother; Prostate cancer (age of onset: 78) in her brother. Allergies  Allergen Reactions  . Latex Rash and Other (See Comments)    Blisters, also   Current Outpatient Medications on File Prior to Visit  Medication Sig Dispense Refill  . ALPRAZolam (XANAX) 0.25 MG tablet Take 0.5 tablets (0.125 mg total) by mouth daily as needed for anxiety. 30 tablet 0  . Ascorbic Acid (VITAMIN C) 1000 MG tablet Take 1,000 mg by mouth daily.    Marland Kitchen atorvastatin (LIPITOR) 40 MG tablet TAKE 1 TABLET BY MOUTH AT  BEDTIME (Patient taking differently: Take 40 mg by mouth at bedtime. ) 90 tablet 3  . Cholecalciferol (VITAMIN D-3 PO) Take 1 capsule by mouth daily.    . cyanocobalamin (,VITAMIN B-12,) 1000 MCG/ML injection Inject 1 mL (1,000 mcg total) into the muscle every 30 (thirty) days. 3 mL 2  . ferrous sulfate 325 (65 FE) MG tablet Take by mouth.    . gabapentin (NEURONTIN) 300 MG capsule Take 300 mg by mouth 3 (three) times  daily.     . irbesartan (AVAPRO) 150 MG tablet TAKE 1 TABLET BY MOUTH  DAILY (Patient taking differently: Take 150 mg by mouth in the morning. ) 90 tablet 3  . ondansetron (ZOFRAN) 8 MG tablet Take 8 mg by mouth every 8 (eight) hours as needed for nausea or vomiting.    . pantoprazole (PROTONIX) 40 MG tablet Take 1 tablet (40 mg total) by mouth daily before breakfast. 30 tablet 0  . PARoxetine (PAXIL) 20 MG tablet TAKE 3 TABLETS BY MOUTH IN  THE MORNING (Patient taking differently: Take 60 mg by mouth in the morning. ) 270 tablet 3  . polyethylene glycol powder (GLYCOLAX/MIRALAX) 17 GM/SCOOP  powder Take 17 g by mouth daily as needed for mild constipation.    . SUMAtriptan (IMITREX) 100 MG tablet Take 1 tablet (100 mg total) by mouth 2 (two) times daily as needed. At least 2 hrs between doses as needed bid (Patient taking differently: Take 100 mg by mouth 2 (two) times daily as needed for migraine or headache (and may repeat once in 2 hours, if no relief). ) 10 tablet 3  . SYRINGE-NEEDLE, DISP, 3 ML 25G X 1" 3 ML MISC Use to inject Vit B12 once a month. 12 each 1  . valACYclovir (VALTREX) 1000 MG tablet TAKE 2 TABLETS BY MOUTH  EVERY 12 HOURS FOR 1 DAY;  START: ASAP AFTER SYMPTOM  ONSET 12 tablet 3  . vitamin E (VITAMIN E) 180 MG (400 UNITS) capsule Take 400 Units by mouth daily.     No current facility-administered medications on file prior to visit.   Review of Systems All otherwise neg per pt    Objective:   Physical Exam BP 118/60 (BP Location: Left Arm, Patient Position: Sitting, Cuff Size: Large)   Pulse 93   Temp 98.8 F (37.1 C) (Oral)   Ht 5' 3"  (1.6 m)   Wt 169 lb (76.7 kg)   SpO2 95%   BMI 29.94 kg/m  VS noted,  Constitutional: Pt appears in NAD HENT: Head: NCAT.  Right Ear: External ear normal.  Left Ear: External ear normal.  Eyes: . Pupils are equal, round, and reactive to light. Conjunctivae and EOM are normal Nose: without d/c or deformity Neck: Neck supple. Gross normal ROM Cardiovascular: Normal rate and regular rhythm.   Pulmonary/Chest: Effort normal and breath sounds without rales or wheezing.  Abd:  Soft, NT, ND, + BS, no organomegaly Neurological: Pt is alert. At baseline orientation, motor grossly intact Skin: Skin is warm. No rashes, other new lesions, no LE edema Psychiatric: Pt behavior is normal without agitation  All otherwise neg per pt Lab Results  Component Value Date   WBC 4.0 05/23/2020   HGB 8.8 (L) 05/23/2020   HCT 27.7 (L) 05/23/2020   PLT 254 05/23/2020   GLUCOSE 123 (H) 05/08/2020   CHOL 181 02/27/2020   TRIG 159.0 (H)  02/27/2020   HDL 38.60 (L) 02/27/2020   LDLCALC 111 (H) 02/27/2020   ALT 63 (H) 05/08/2020   AST 63 (H) 05/08/2020   NA 134 (L) 05/08/2020   K 4.1 05/08/2020   CL 100 05/08/2020   CREATININE 0.90 05/08/2020   BUN 17 05/08/2020   CO2 25 05/08/2020   TSH 2.14 05/01/2020   INR 1.0 02/10/2019   HGBA1C 6.1 (H) 11/30/2014      Assessment & Plan:

## 2020-06-28 NOTE — Patient Instructions (Addendum)
You had the antibiotic shot today - rocephin  Please take all new medication as prescribed - the cipro antibiotic for the full 10 days  After that, start the cephalexin 500 mg twice per wk   Please continue all other medications as before, and refills have been done if requested.  Please have the pharmacy call with any other refills you may need.  Please keep your appointments with your specialists as you may have planned  Please go to the LAB at the blood drawing area for the tests to be done - just the urine testing today  You will be contacted by phone if any changes need to be made immediately.  Otherwise, you will receive a letter about your results with an explanation, but please check with MyChart first.  Please remember to sign up for MyChart if you have not done so, as this will be important to you in the future with finding out test results, communicating by private email, and scheduling acute appointments online when needed.  You will be contacted regarding the referral for: Urology - Dr Amalia Hailey

## 2020-06-28 NOTE — Telephone Encounter (Signed)
Patient is stating she has another UTI and would like to come leave a specimen to get it checked this afternoon, and she wants to schedule an appointment  Monday to get lab work checked due to anemia. Patient is going to be out of town tonight and tomorrow and would like to leave one before she leaves  Please advise

## 2020-06-29 ENCOUNTER — Encounter: Payer: Self-pay | Admitting: Internal Medicine

## 2020-06-29 LAB — URINE CULTURE

## 2020-06-29 LAB — URINALYSIS, ROUTINE W REFLEX MICROSCOPIC
Bilirubin Urine: NEGATIVE
Glucose, UA: NEGATIVE
Hgb urine dipstick: NEGATIVE
Hyaline Cast: NONE SEEN /LPF
Ketones, ur: NEGATIVE
Nitrite: NEGATIVE
Protein, ur: NEGATIVE
RBC / HPF: NONE SEEN /HPF (ref 0–2)
Specific Gravity, Urine: 1.012 (ref 1.001–1.03)
Squamous Epithelial / HPF: NONE SEEN /HPF (ref <=5)
pH: 6.5 (ref 5.0–8.0)

## 2020-06-30 ENCOUNTER — Encounter: Payer: Self-pay | Admitting: Internal Medicine

## 2020-06-30 NOTE — Assessment & Plan Note (Signed)
Also for cephalexin preventive,  to f/u any worsening symptoms or concerns

## 2020-06-30 NOTE — Assessment & Plan Note (Signed)
Also for urology referral

## 2020-06-30 NOTE — Assessment & Plan Note (Addendum)
For urine studies, empiric rocephin im 1 gm, cipro asd  I spent 31 minutes in preparing to see the patient by review of recent labs, imaging and procedures, obtaining and reviewing separately obtained history, communicating with the patient and family or caregiver, ordering medications, tests or procedures, and documenting clinical information in the EHR including the differential Dx, treatment, and any further evaluation and other management of uti, sterss incontinence, recurrent uti

## 2020-07-02 ENCOUNTER — Ambulatory Visit (INDEPENDENT_AMBULATORY_CARE_PROVIDER_SITE_OTHER): Payer: Medicare Other | Admitting: Physician Assistant

## 2020-07-02 ENCOUNTER — Other Ambulatory Visit: Payer: Self-pay

## 2020-07-02 ENCOUNTER — Encounter: Payer: Self-pay | Admitting: Physician Assistant

## 2020-07-02 VITALS — BP 140/90 | HR 91 | Temp 97.9°F | Ht 63.0 in | Wt 170.0 lb

## 2020-07-02 DIAGNOSIS — B001 Herpesviral vesicular dermatitis: Secondary | ICD-10-CM | POA: Diagnosis not present

## 2020-07-02 DIAGNOSIS — Z23 Encounter for immunization: Secondary | ICD-10-CM

## 2020-07-02 DIAGNOSIS — R21 Rash and other nonspecific skin eruption: Secondary | ICD-10-CM

## 2020-07-02 DIAGNOSIS — D649 Anemia, unspecified: Secondary | ICD-10-CM

## 2020-07-02 DIAGNOSIS — R159 Full incontinence of feces: Secondary | ICD-10-CM | POA: Diagnosis not present

## 2020-07-02 DIAGNOSIS — N390 Urinary tract infection, site not specified: Secondary | ICD-10-CM

## 2020-07-02 LAB — CBC WITH DIFFERENTIAL/PLATELET
Absolute Monocytes: 472 cells/uL (ref 200–950)
Basophils Absolute: 52 cells/uL (ref 0–200)
Basophils Relative: 1.3 %
Eosinophils Absolute: 72 cells/uL (ref 15–500)
Eosinophils Relative: 1.8 %
HCT: 33.7 % — ABNORMAL LOW (ref 35.0–45.0)
Hemoglobin: 11 g/dL — ABNORMAL LOW (ref 11.7–15.5)
Lymphs Abs: 600 cells/uL — ABNORMAL LOW (ref 850–3900)
MCH: 29.1 pg (ref 27.0–33.0)
MCHC: 32.6 g/dL (ref 32.0–36.0)
MCV: 89.2 fL (ref 80.0–100.0)
MPV: 10.7 fL (ref 7.5–12.5)
Monocytes Relative: 11.8 %
Neutro Abs: 2804 cells/uL (ref 1500–7800)
Neutrophils Relative %: 70.1 %
Platelets: 198 10*3/uL (ref 140–400)
RBC: 3.78 10*6/uL — ABNORMAL LOW (ref 3.80–5.10)
RDW: 19.9 % — ABNORMAL HIGH (ref 11.0–15.0)
Total Lymphocyte: 15 %
WBC: 4 10*3/uL (ref 3.8–10.8)

## 2020-07-02 MED ORDER — TRIAMCINOLONE ACETONIDE 0.1 % EX CREA: TOPICAL_CREAM | CUTANEOUS | 0 refills | Status: DC

## 2020-07-02 MED ORDER — VALACYCLOVIR HCL 1 G PO TABS: ORAL_TABLET | ORAL | 0 refills | Status: DC

## 2020-07-02 NOTE — Progress Notes (Signed)
Autumn Conley is a 74 y.o. female is here for follow up.  I acted as a Education administrator for [employer], PA-C Anselmo Pickler, LPN  History of Present Illness:   Chief Complaint  Patient presents with  . Anemia  . Rash   HPI   Anemia Pt says needs to discuss her taking over her care for anemia. She sees Dr. Mindi Junker (oncologist) -- who says that her anemia is not being caused by her cancer (which is currently in remission.) She states that he told her that he doesn't treat hematological issues and she should either see me or ob referred to hematology. She is taking oral iron daily on an empty stomach. She has constipation because of this and will take miralax prn -- and therefore is having intermittent diarrhea and constipation. Reports that her dizzy/lightheaded spells are improved, has not had one in at least one week. Denies overt bleeding.  Rash Pt c/o rash left side of back, itching. Pt has used bacitracin, and OTC itch cream. Area is not painful. Area has been present for at least one month. She has bad habit of picking at her skin.  Fecal incontinence She reports that over the weekend she was bending over to take care of her dog and she had fecal incontinence. She has never experienced this before. She took a shower immediately afterwards. She is fearful that she is going to get a UTI due to this.  UTI Was seen at another [place] site last week for UTI symptoms. She is still on cipro and was also given keflex to use twice weekly after this for prevention. She denies further cloudy/smelly urine, pelvic pressure/pain.  Cold sores Has prn valtrex to use for cold sores. She realized today that she has only been taking a one time 1g tablet with her outbreaks, she is wondering if this is causing them to not go away quickly. She currently has three cold sores -- two on her upper lip and one on her lower lip.     There are no preventive care reminders to display for this  patient.  Past Medical History:  Diagnosis Date  . Anemia   . Anxiety   . Depression   . Dyspnea   . Fibromyalgia    "some; not chronic" (11/29/2014)  . GERD (gastroesophageal reflux disease)   . Glaucoma of both eyes   . Headache   . Hypertension   . IBS (irritable bowel syndrome)   . Mitral valve prolapse   . NASH (nonalcoholic steatohepatitis)   . Osteoarthritis   . PONV (postoperative nausea and vomiting)   . Stroke ([place])   . 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[facility])      Social History   Tobacco Use  . Smoking status: Never Smoker  . Smokeless tobacco: Never Used  Vaping Use  . Vaping Use: Never used  Substance Use Topics  . Alcohol use: No  . Drug use: No    Past Surgical History:  Procedure Laterality Date  . ABDOMINAL HYSTERECTOMY  1980  . APPENDECTOMY  1953  . BIOPSY  05/03/2020   Procedure: BIOPSY;  Surgeon: Gatha Mayer, MD;  Location: [place];  Service: Endoscopy;;  . BREAST BIOPSY Left   . BREAST LUMPECTOMY Left   . CATARACT EXTRACTION, BILATERAL  2018  . COLONOSCOPY WITH PROPOFOL N/A 05/03/2020   Procedure: COLONOSCOPY WITH PROPOFOL;  Surgeon: Gatha Mayer, MD;  Location: [facility] ENDOSCOPY;  Service: Endoscopy;  Laterality: N/A;  . ESOPHAGOGASTRODUODENOSCOPY (EGD) WITH PROPOFOL  N/A 05/03/2020   Procedure: ESOPHAGOGASTRODUODENOSCOPY (EGD) WITH PROPOFOL;  Surgeon: Gatha Mayer, MD;  Location: [facility] ENDOSCOPY;  Service: Endoscopy;  Laterality: N/A;  . MASTECTOMY Left ~ 2009  . PARASTERNAL EXPLORATION Right 02/14/2019   Procedure: PARASTERNAL MEDIAL EXPLORATION WITH BIOPIES.;  Surgeon: [name], MD;  Location: [place];  Service: Thoracic;  Laterality: Right;  . POLYPECTOMY  05/03/2020   Procedure: POLYPECTOMY;  Surgeon: Gatha Mayer, MD;  Location: [facility] ENDOSCOPY;  Service: Endoscopy;;    Family History  Problem Relation Age of Onset  . Lung cancer Mother 61  . Prostate cancer Brother 57    PMHx, SurgHx, SocialHx, FamHx, Medications, and Allergies were  reviewed in the Visit Navigator and updated as appropriate.   Patient Active Problem List   Diagnosis Date Noted  . Recurrent UTI 06/28/2020  . Inflammatory arthritis 05/16/2020  . Erosive gastritis   . Benign neoplasm of ascending colon   . Heme + stool   . Change in bowel habit   . Anemia 05/01/2020  . Dyspnea 05/01/2020  . Generalized weakness 05/01/2020  . UTI (urinary tract infection) 05/01/2020  . Symptomatic anemia 05/01/2020  . Glaucoma of both eyes   . NASH (nonalcoholic steatohepatitis)   . Mitral valve prolapse   . Thymoma -- managed by [facility] - extensive documentation in Care Everywhere 03/22/2019  . Breast cancer ([place]) 03/18/2019  . Mass of upper lobe of right lung 01/20/2019  . B12 deficiency 12/19/2018  . Stress incontinence in female 12/19/2018  . Dyslipidemia 12/14/2018  . OSA (obstructive sleep apnea) 12/07/2018  . History of breast cancer 12/07/2018  . Cognitive complaints 11/25/2018  . False positive stress test 01/29/2015  . Migraine without aura and without status migrainosus, not intractable 12/18/2014  . Small vessel disease, cerebrovascular 12/18/2014  . GERD (gastroesophageal reflux disease) 12/02/2014  . HTN (hypertension) 12/27/2013  . Anxiety 12/27/2013  . Depression 11/03/2012  . Elevated LFTs 11/03/2012  . Fibromyalgia 11/03/2012  . IBS (irritable bowel syndrome) 11/03/2012  . Lymphocytic colitis 11/03/2012  . Paget's disease of nipple ([place]) 11/03/2012  . Vitamin D deficiency 11/03/2012  . Osteoarthritis 11/03/2012    Social History   Tobacco Use  . Smoking status: Never Smoker  . Smokeless tobacco: Never Used  Vaping Use  . Vaping Use: Never used  Substance Use Topics  . Alcohol use: No  . Drug use: No    Current Medications and Allergies:    Current Outpatient Medications:  .  ALPRAZolam (XANAX) 0.25 MG tablet, Take 0.5 tablets (0.125 mg total) by mouth daily as needed for anxiety., Disp: 30 tablet, Rfl: 0 .  Ascorbic Acid  (VITAMIN C) 1000 MG tablet, Take 1,000 mg by mouth daily., Disp: , Rfl:  .  atorvastatin (LIPITOR) 40 MG tablet, TAKE 1 TABLET BY MOUTH AT  BEDTIME (Patient taking differently: Take 40 mg by mouth at bedtime. ), Disp: 90 tablet, Rfl: 3 .  cephALEXin (KEFLEX) 500 MG capsule, 1 tab by mouth every Mon and Thur, Disp: 24 capsule, Rfl: 0 .  Cholecalciferol (VITAMIN D-3 PO), Take 1 capsule by mouth daily., Disp: , Rfl:  .  ciprofloxacin (CIPRO) 500 MG tablet, Take 1 tablet (500 mg total) by mouth 2 (two) times daily for 10 days., Disp: 20 tablet, Rfl: 0 .  cyanocobalamin (,VITAMIN B-12,) 1000 MCG/ML injection, Inject 1 mL (1,000 mcg total) into the muscle every 30 (thirty) days., Disp: 3 mL, Rfl: 2 .  ferrous sulfate 325 (65 FE) MG tablet,  Take by mouth., Disp: , Rfl:  .  gabapentin (NEURONTIN) 300 MG capsule, Take 300 mg by mouth 3 (three) times daily. , Disp: , Rfl:  .  irbesartan (AVAPRO) 150 MG tablet, TAKE 1 TABLET BY MOUTH  DAILY (Patient taking differently: Take 150 mg by mouth in the morning. ), Disp: 90 tablet, Rfl: 3 .  ondansetron (ZOFRAN) 8 MG tablet, Take 8 mg by mouth every 8 (eight) hours as needed for nausea or vomiting., Disp: , Rfl:  .  pantoprazole (PROTONIX) 40 MG tablet, Take 1 tablet (40 mg total) by mouth daily before breakfast., Disp: 30 tablet, Rfl: 0 .  PARoxetine (PAXIL) 20 MG tablet, TAKE 3 TABLETS BY MOUTH IN  THE MORNING (Patient taking differently: Take 60 mg by mouth in the morning. ), Disp: 270 tablet, Rfl: 3 .  polyethylene glycol powder (GLYCOLAX/MIRALAX) 17 GM/SCOOP powder, Take 17 g by mouth daily as needed for mild constipation., Disp: , Rfl:  .  SUMAtriptan (IMITREX) 100 MG tablet, Take 1 tablet (100 mg total) by mouth 2 (two) times daily as needed. At least 2 hrs between doses as needed bid (Patient taking differently: Take 100 mg by mouth 2 (two) times daily as needed for migraine or headache (and may repeat once in 2 hours, if no relief). ), Disp: 10 tablet, Rfl: 3 .   SYRINGE-NEEDLE, DISP, 3 ML 25G X 1" 3 ML MISC, Use to inject Vit B12 once a month., Disp: 12 each, Rfl: 1 .  vitamin E (VITAMIN E) 180 MG (400 UNITS) capsule, Take 400 Units by mouth daily., Disp: , Rfl:  .  triamcinolone cream (KENALOG) 0.1 %, Apply to affected area 1-2 times daily, Disp: 30 g, Rfl: 0 .  valACYclovir (VALTREX) 1000 MG tablet, Take two tablets daily x 5 days. Then for future outbreaks, take 2 tablets by mouth every 12 hours for 1 day total, Disp: 60 tablet, Rfl: 0   Allergies  Allergen Reactions  . Latex Rash and Other (See Comments)    Blisters, also    Review of Systems   ROS  Negative unless otherwise specified per HPI.  Vitals:   Vitals:   07/02/20 1112  BP: 140/90  Pulse: 91  Temp: 97.9 F (36.6 C)  TempSrc: Temporal  SpO2: 97%  Weight: 170 lb (77.1 kg)  Height: 5' 3"  (1.6 m)     Body mass index is 30.11 kg/m.   Physical Exam:    Physical Exam Vitals and nursing note reviewed.  Constitutional:      General: She is not in acute distress.    Appearance: She is well-developed. She is not ill-appearing or toxic-appearing.  Cardiovascular:     Rate and Rhythm: Normal rate and regular rhythm.     Pulses: Normal pulses.     Heart sounds: Normal heart sounds, S1 normal and S2 normal.     Comments: No LE edema Pulmonary:     Effort: Pulmonary effort is normal.     Breath sounds: Normal breath sounds.  Skin:    General: Skin is warm and dry.     Comments: 2 x erythematous plaques to her L mid back; evidence of old scabbing; no active discharge or TTP  3 lesions to her lips -- two on the upper lip and one on the lower lip; areas are erythematous with slight crusting  Neurological:     Mental Status: She is alert.     GCS: GCS eye subscore is 4. GCS verbal subscore is  5. GCS motor subscore is 6.  Psychiatric:        Speech: Speech normal.        Behavior: Behavior normal. Behavior is cooperative.      Assessment and Plan:    Autumn Conley was seen  today for anemia and rash.  Diagnoses and all orders for this visit:  Symptomatic anemia Update labs today per patient request. If labs stable, will plan to recheck in two months. If labs have decreased, will refer to hematology. Continue current oral iron supplementation at this time, will defer changes to lab results. -     CBC with Differential/Platelet; Future -     Ferritin; Future -     Comprehensive metabolic panel; Future -     CBC with Differential/Platelet -     Comprehensive metabolic panel -     Ferritin  Cold sore I have refilled her medication today. I requested that she take this BID x 5 days.  After this, she may take prn, with instructions to take 2 tablets by mouth q 12 hours x 1 day. Follow-up if concerns.  Rash Possible dermatitis? Will start topical kenalog. Follow-up if concerns.  Incontinence of feces, unspecified fecal incontinence type Patient reports that she is having difficulty managing constipation. Discussed using 1/2 dose miralax if she feels like she needs this. Continue to push fluids. If recurs, will refer to GI.  Recurrent UTI Provided reassurance that she is currently on appropriate treatment. Low threshold to obtain urine culture if symptoms return within the next week. Agree with urology referral placed by another provider.  Other orders -     valACYclovir (VALTREX) 1000 MG tablet; Take two tablets daily x 5 days. Then for future outbreaks, take 2 tablets by mouth every 12 hours for 1 day total -     triamcinolone cream (KENALOG) 0.1 %; Apply to affected area 1-2 times daily  Time spent with patient today was 45 minutes which consisted of chart review, discussing diagnosis, work up, treatment answering questions and documentation.  [name], PA-C [place] [place] 07/02/2020  Follow-up: No follow-ups on file.

## 2020-07-02 NOTE — Patient Instructions (Addendum)
It was great to see you!  For your anemia: -Try to take your oral iron with orange juice -We will update your labs today, however keep in mind it will take a full 3 months of oral iron to get your numbers back on track -We will refer to a hematologist if needed for infusions  For your bowels: -Continue to work on adequate hydration -Consider only taking 1/2 dose of miralax when needed -If fecal incontinence returns, let me know  For your urine: -Continue your current antibiotics -If symptoms return or you have concerns, come back and see [place]  For your cold sores: -Let's take valtrex twice daily x 5 days -After this course, we will have you take the valtrex as previously prescribed  For your rash: -Use the steroid ointment that I have sent in -Follow-up with me if no improvement  Take care,  [name] PA-C

## 2020-07-03 ENCOUNTER — Other Ambulatory Visit: Payer: Self-pay | Admitting: *Deleted

## 2020-07-03 DIAGNOSIS — R748 Abnormal levels of other serum enzymes: Secondary | ICD-10-CM

## 2020-07-03 LAB — COMPREHENSIVE METABOLIC PANEL
AG Ratio: 1.8 (calc) (ref 1.0–2.5)
ALT: 55 U/L — ABNORMAL HIGH (ref 6–29)
AST: 72 U/L — ABNORMAL HIGH (ref 10–35)
Albumin: 4.3 g/dL (ref 3.6–5.1)
Alkaline phosphatase (APISO): 104 U/L (ref 37–153)
BUN: 19 mg/dL (ref 7–25)
CO2: 26 mmol/L (ref 20–32)
Calcium: 9.4 mg/dL (ref 8.6–10.4)
Chloride: 104 mmol/L (ref 98–110)
Creat: 0.89 mg/dL (ref 0.60–0.93)
Globulin: 2.4 g/dL (calc) (ref 1.9–3.7)
Glucose, Bld: 119 mg/dL — ABNORMAL HIGH (ref 65–99)
Potassium: 4.5 mmol/L (ref 3.5–5.3)
Sodium: 138 mmol/L (ref 135–146)
Total Bilirubin: 0.4 mg/dL (ref 0.2–1.2)
Total Protein: 6.7 g/dL (ref 6.1–8.1)

## 2020-07-03 LAB — FERRITIN: Ferritin: 67 ng/mL (ref 16–288)

## 2020-07-09 ENCOUNTER — Other Ambulatory Visit: Payer: Self-pay | Admitting: Physician Assistant

## 2020-07-09 ENCOUNTER — Ambulatory Visit
Admission: RE | Admit: 2020-07-09 | Discharge: 2020-07-09 | Disposition: A | Discharge status: Home / Self Care | Payer: Medicare Other | Source: Ambulatory Visit | Attending: Physician Assistant | Admitting: Physician Assistant

## 2020-07-09 DIAGNOSIS — K7689 Other specified diseases of liver: Secondary | ICD-10-CM | POA: Diagnosis not present

## 2020-07-09 DIAGNOSIS — R748 Abnormal levels of other serum enzymes: Secondary | ICD-10-CM

## 2020-07-12 IMAGING — CR CHEST - 2 VIEW
2 series · 2 of 2 positions shown · non-contrast
Comparison: CT chest 12/29/2018.  Biopsy CT 01/31/2019.

CLINICAL DATA: Lung mass.  Preop for biopsy.

EXAM:
CHEST - 2 VIEW

[w chest pa]
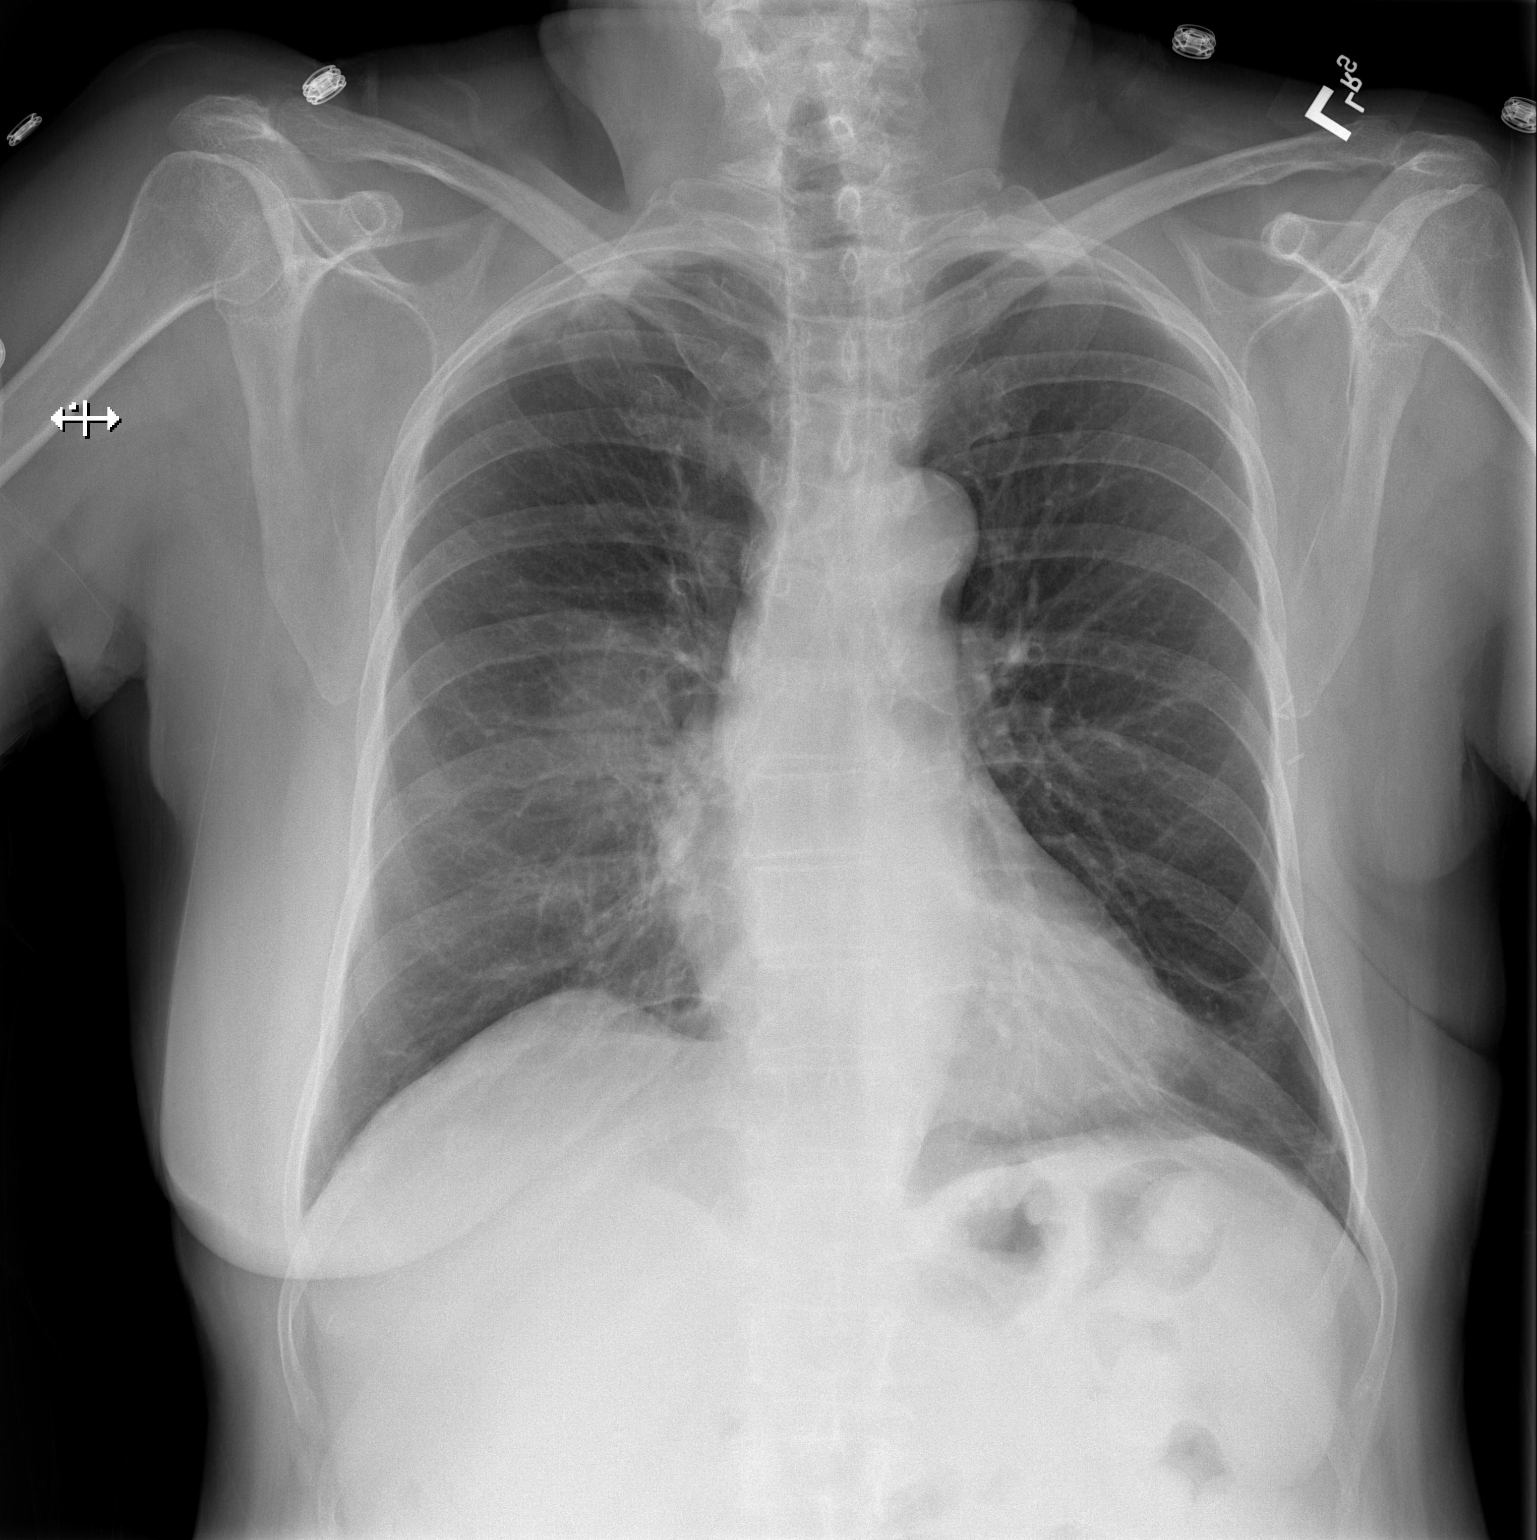

[w chest lat]
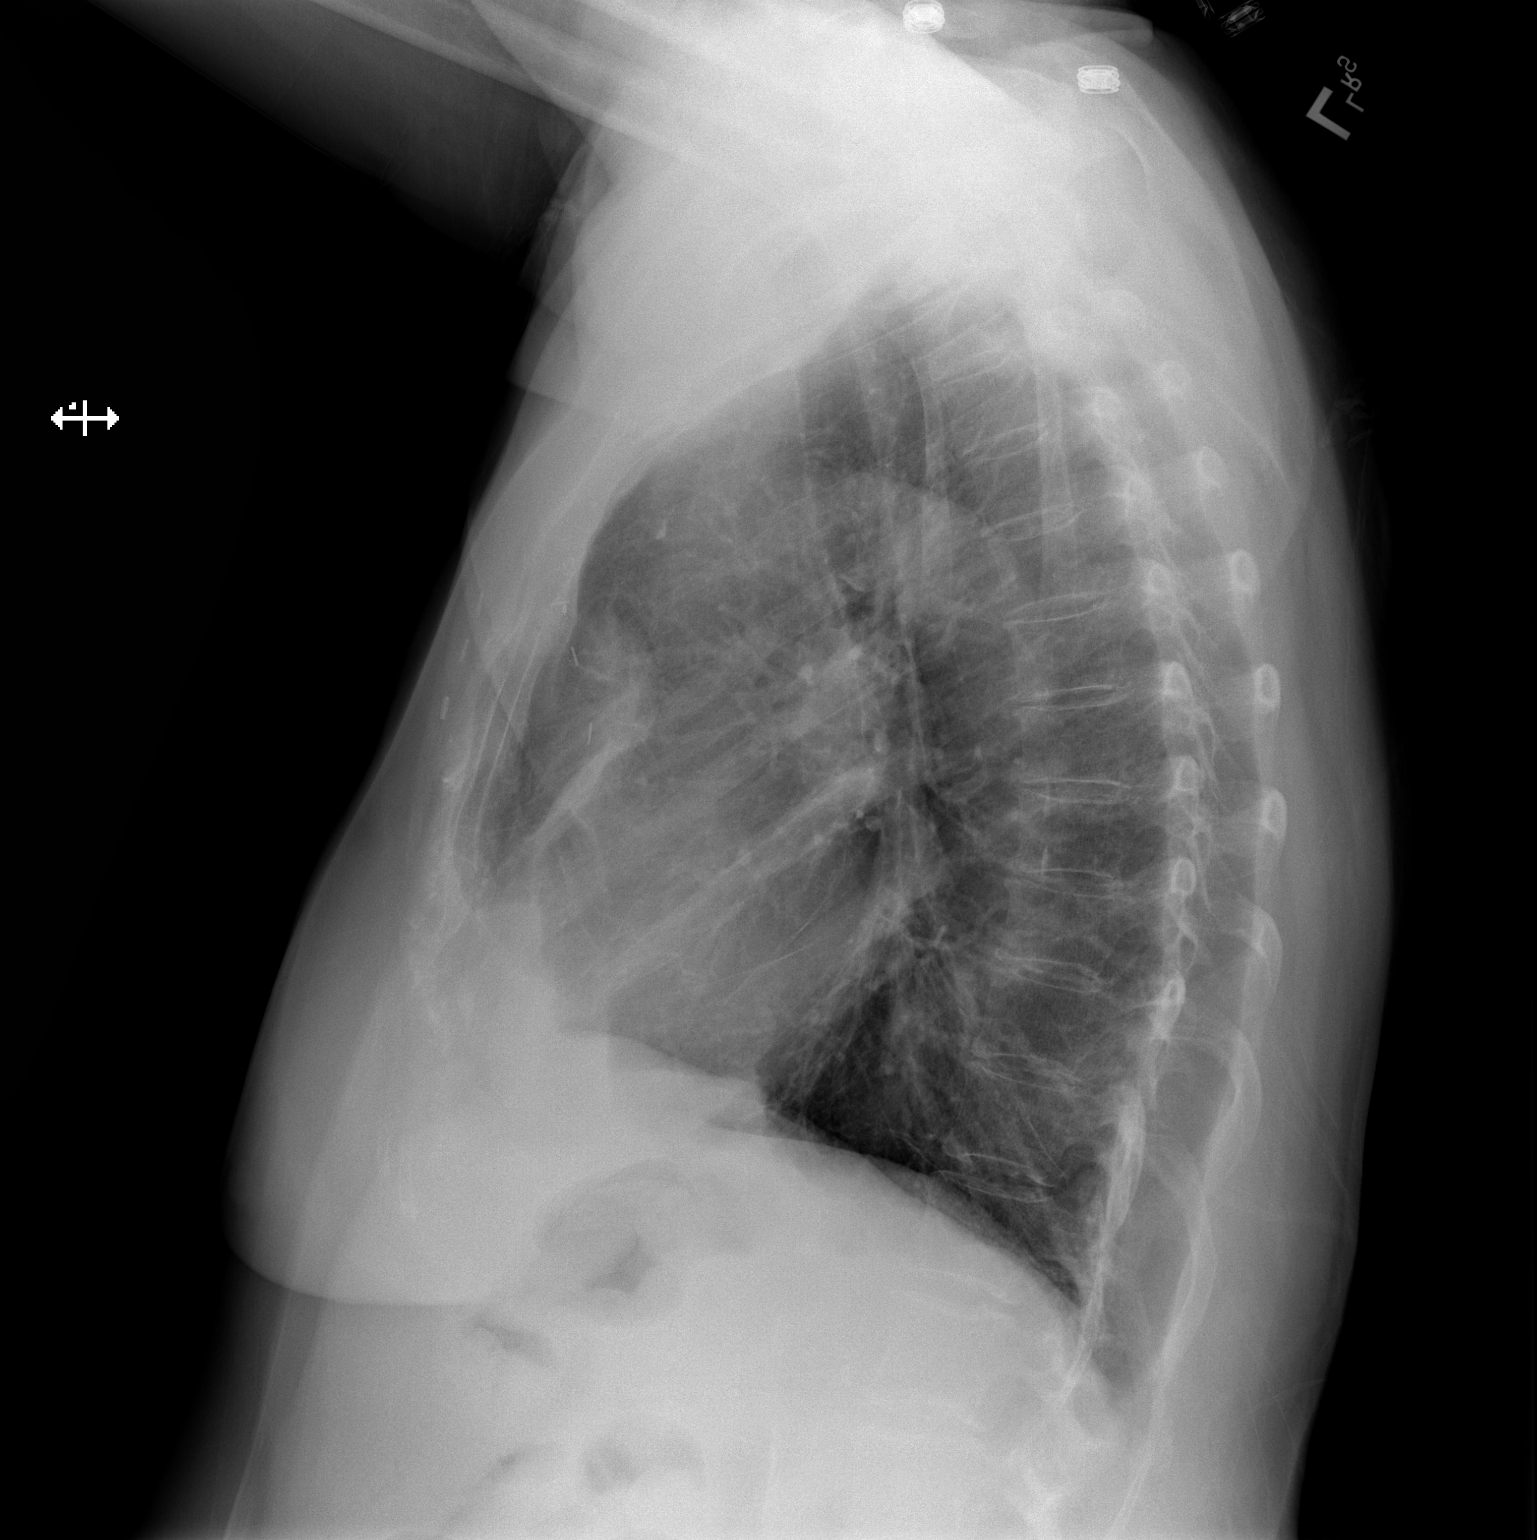

[2 of 2 positions shown; findings below may reference images not displayed]

FINDINGS: The heart size is normal. Anterior right middle lobe pleural-based
lung mass is again noted. Pleural based disease the right apex is
again noted. No other focal lesions are evident. There is no edema
or effusion. The visualized soft tissues and bony thorax are
unremarkable.
IMPRESSION: 1. Anterior right middle lobe lung mass again noted.
2. Right apical pleural disease noted.
3. No acute cardiopulmonary disease.

## 2020-07-13 IMAGING — DX PORTABLE CHEST - 1 VIEW
1 series · 1 of 1 positions shown · non-contrast
Comparison: One-view chest x-ray 02/14/2019

CLINICAL DATA: Pneumothorax.  Chest pain.

EXAM:
PORTABLE CHEST 1 VIEW

[chest]
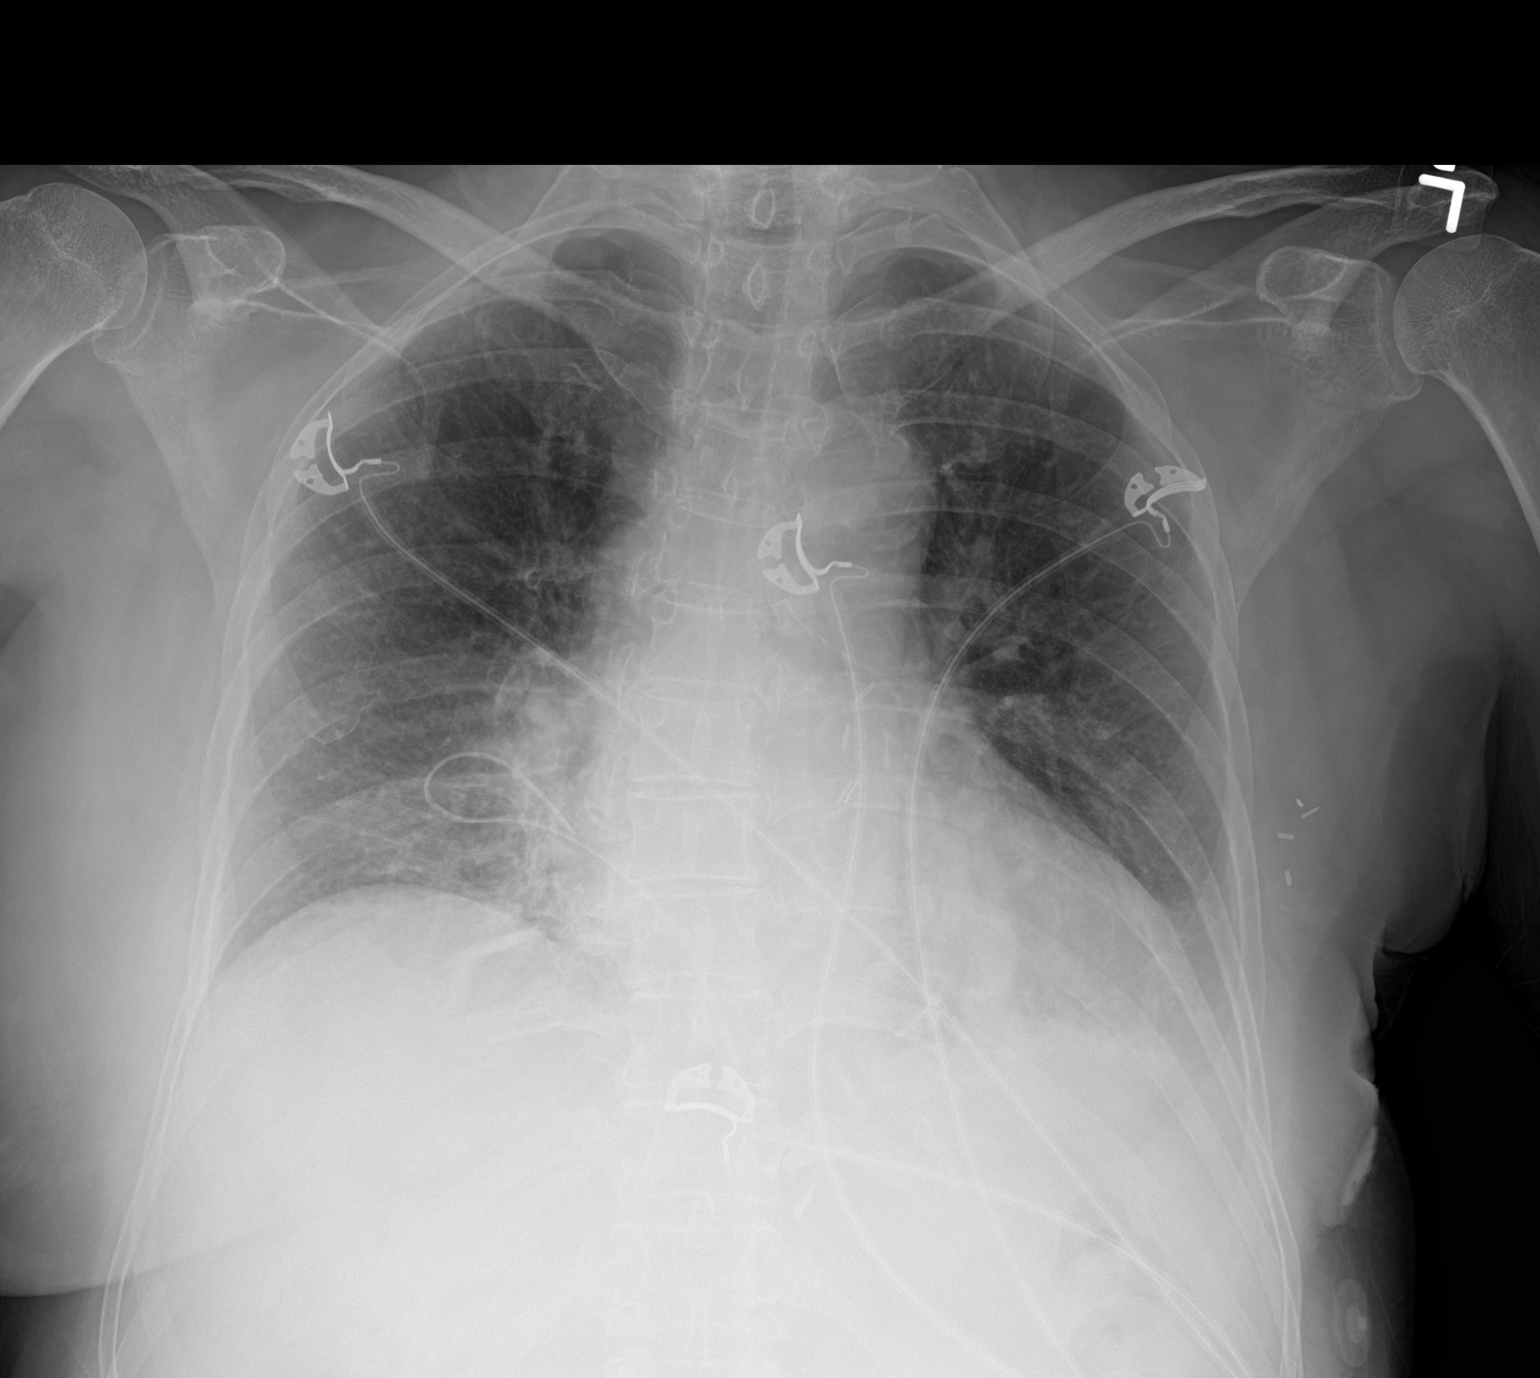

[1 of 1 positions shown; findings below may reference images not displayed]

FINDINGS: Previously noted right pneumothorax is no longer present.
Right-sided chest tube remains in place.

The heart is enlarged. A small left pleural effusion and associated
airspace disease is stable. Mild pulmonary vascular congestion is
stable. Lung volumes remain low.
IMPRESSION: 1. No significant pneumothorax. Right-sided chest tube remains in
place.
2. Postoperative volume loss in the right lung.
3. Stable small left pleural effusion and associated airspace
disease, likely atelectasis.

## 2020-07-14 IMAGING — CR CHEST - 2 VIEW
2 series · 2 of 2 positions shown · non-contrast
Comparison: One-view chest x-ray 02/15/2019

CLINICAL DATA: Pneumothorax.

EXAM:
CHEST - 2 VIEW

[chest lat]
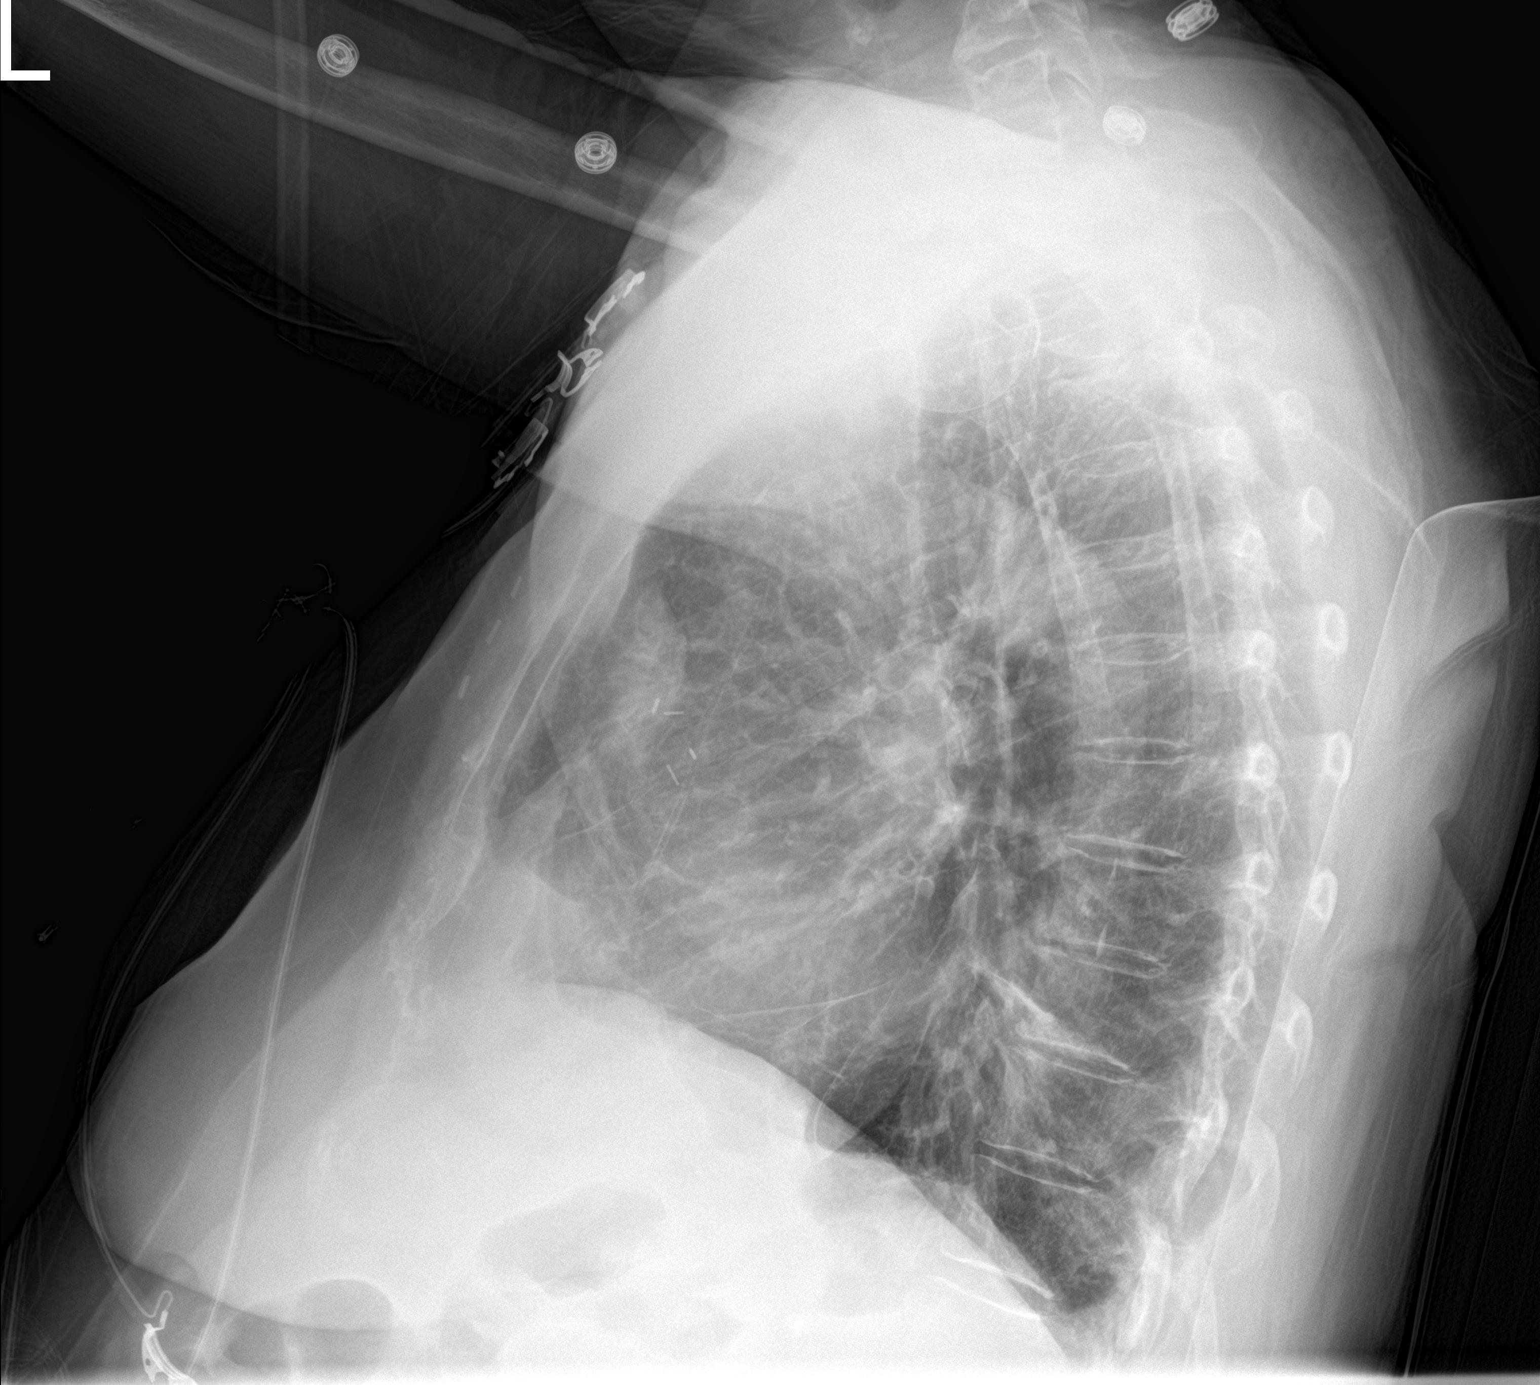

[chest ap]
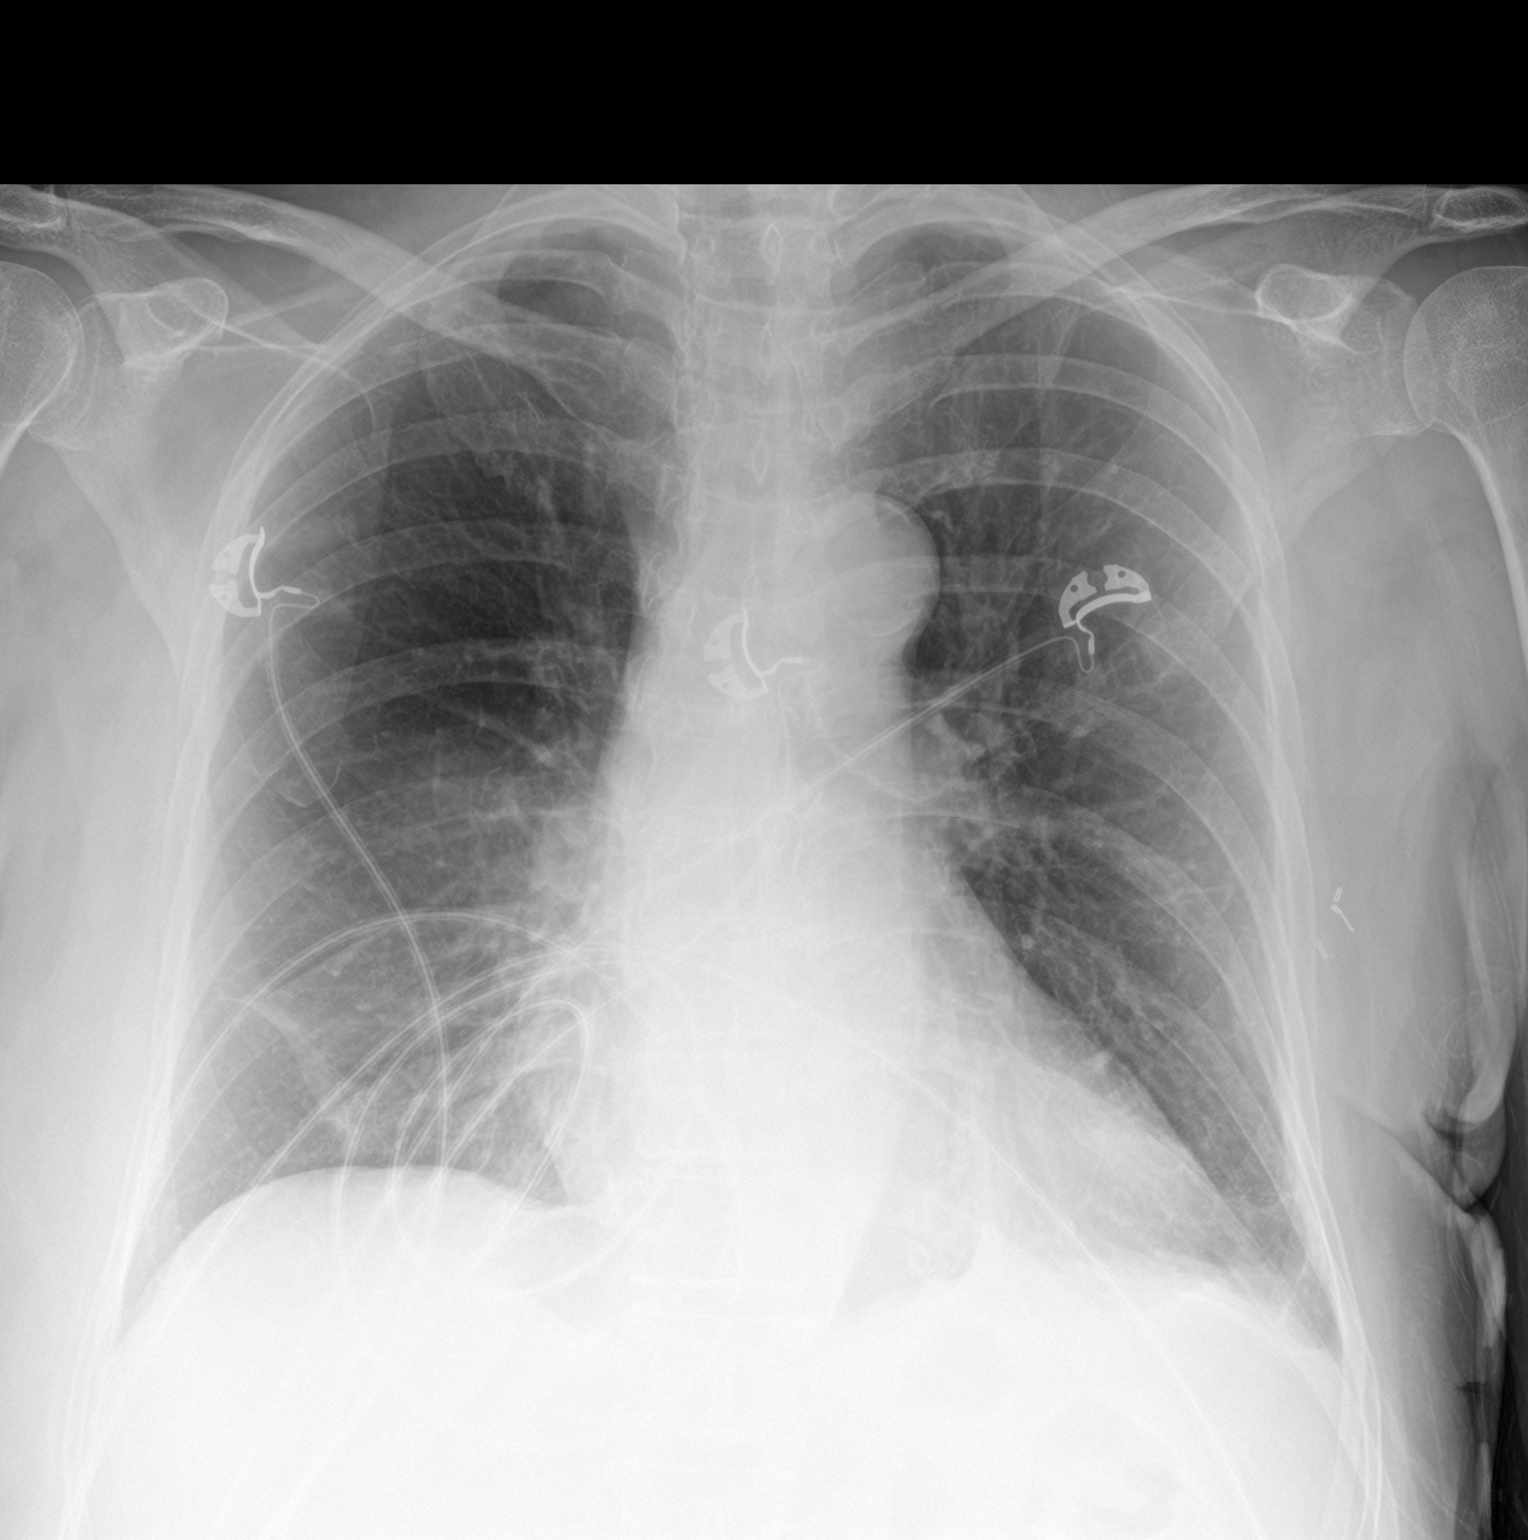

[2 of 2 positions shown; findings below may reference images not displayed]

FINDINGS: The right-sided chest tube has been removed. There is no new
residual recurrent pneumothorax.

The heart is mildly enlarged. Atherosclerotic changes are noted at
the aortic arch. Left greater than right basilar airspace disease
remains. Overall aeration is improved.
IMPRESSION: 1. No significant pneumothorax following removal of chest tube.
2. Mild cardiomegaly without failure.
3. Left greater than right basilar airspace disease. This is
improved. Findings likely represent atelectasis. Small left effusion
is again noted.

## 2020-07-16 DIAGNOSIS — R002 Palpitations: Secondary | ICD-10-CM | POA: Diagnosis not present

## 2020-07-17 ENCOUNTER — Other Ambulatory Visit: Payer: Self-pay

## 2020-07-17 ENCOUNTER — Ambulatory Visit (INDEPENDENT_AMBULATORY_CARE_PROVIDER_SITE_OTHER): Payer: Medicare Other

## 2020-07-17 DIAGNOSIS — Z23 Encounter for immunization: Secondary | ICD-10-CM

## 2020-07-17 NOTE — Progress Notes (Signed)
Per orders of [name], [place], injection of #2 Pnuemovax 23 given by Tobe Sos in right deltoid. Patient tolerated injection well.

## 2020-08-16 NOTE — Telephone Encounter (Signed)
error 

## 2020-08-29 ENCOUNTER — Other Ambulatory Visit: Payer: Self-pay

## 2020-08-29 ENCOUNTER — Encounter: Payer: Self-pay | Admitting: Physician Assistant

## 2020-08-29 ENCOUNTER — Ambulatory Visit (INDEPENDENT_AMBULATORY_CARE_PROVIDER_SITE_OTHER): Payer: Medicare Other | Admitting: Physician Assistant

## 2020-08-29 VITALS — BP 122/78 | HR 80 | Temp 97.1°F | Ht 63.0 in | Wt 166.2 lb

## 2020-08-29 DIAGNOSIS — N644 Mastodynia: Secondary | ICD-10-CM | POA: Diagnosis not present

## 2020-08-29 DIAGNOSIS — D649 Anemia, unspecified: Secondary | ICD-10-CM

## 2020-08-29 DIAGNOSIS — R748 Abnormal levels of other serum enzymes: Secondary | ICD-10-CM | POA: Diagnosis not present

## 2020-08-29 DIAGNOSIS — R159 Full incontinence of feces: Secondary | ICD-10-CM

## 2020-08-29 NOTE — Patient Instructions (Signed)
It was great to see you!  Let's update your blood work today to see where your iron levels are.  Let's follow-up in 6 months, sooner if you have concerns.  Take care,  [name] PA-C

## 2020-08-29 NOTE — Progress Notes (Signed)
Autumn Conley is a 74 y.o. female is here for follow up.  I acted as a Education administrator for [employer], PA-C Anselmo Pickler, LPN   History of Present Illness:   Chief Complaint  Patient presents with  . Anemia    HPI   Anemia Pt here for follow up, currently taking Iron 365 mg every other day. She has had some dizziness if she stands up too quick, passes very quickly. She had a few months of no symptoms, which made her very happy. Over the past few weeks she has about 7 of these episodes. Denies concerns for dehydration, inadequate nutrition, pre-syncope.  She did have a Holter monitor in Sep that has not been resulted yet.    Chest wall/breast pain Using topical CBD on her R chest wall/breast. She is planning to have further imaging in December with her oncologist. Taking 2 x 650 mg tylenol daily for the past two weeks. She has hx of elevated LFTs, she is wondering if this is ok to take.   Fecal incontinence She has had no further episodes of this. Denies any concerns.   There are no preventive care reminders to display for this patient.  Past Medical History:  Diagnosis Date  . Anemia   . Anxiety   . Depression   . Dyspnea   . Fibromyalgia    "some; not chronic" (11/29/2014)  . GERD (gastroesophageal reflux disease)   . Glaucoma of both eyes   . Headache   . Hypertension   . IBS (irritable bowel syndrome)   . Mitral valve prolapse   . NASH (nonalcoholic steatohepatitis)   . Osteoarthritis   . PONV (postoperative nausea and vomiting)   . Stroke ([place])   . 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[facility])      Social History   Tobacco Use  . Smoking status: Never Smoker  . Smokeless tobacco: Never Used  Vaping Use  . Vaping Use: Never used  Substance Use Topics  . Alcohol use: No  . Drug use: No    Past Surgical History:  Procedure Laterality Date  . ABDOMINAL HYSTERECTOMY  1980  . APPENDECTOMY  1953  . BIOPSY  05/03/2020   Procedure: BIOPSY;  Surgeon: Gatha Mayer, MD;  Location:  [place];  Service: Endoscopy;;  . BREAST BIOPSY Left   . BREAST LUMPECTOMY Left   . CATARACT EXTRACTION, BILATERAL  2018  . COLONOSCOPY WITH PROPOFOL N/A 05/03/2020   Procedure: COLONOSCOPY WITH PROPOFOL;  Surgeon: Gatha Mayer, MD;  Location: [facility] ENDOSCOPY;  Service: Endoscopy;  Laterality: N/A;  . ESOPHAGOGASTRODUODENOSCOPY (EGD) WITH PROPOFOL N/A 05/03/2020   Procedure: ESOPHAGOGASTRODUODENOSCOPY (EGD) WITH PROPOFOL;  Surgeon: Gatha Mayer, MD;  Location: [place];  Service: Endoscopy;  Laterality: N/A;  . MASTECTOMY Left ~ 2009  . PARASTERNAL EXPLORATION Right 02/14/2019   Procedure: PARASTERNAL MEDIAL EXPLORATION WITH BIOPIES.;  Surgeon: [name], MD;  Location: [place];  Service: Thoracic;  Laterality: Right;  . POLYPECTOMY  05/03/2020   Procedure: POLYPECTOMY;  Surgeon: Gatha Mayer, MD;  Location: [facility] ENDOSCOPY;  Service: Endoscopy;;    Family History  Problem Relation Age of Onset  . Lung cancer Mother 75  . Prostate cancer Brother 41    PMHx, SurgHx, SocialHx, FamHx, Medications, and Allergies were reviewed in the Visit Navigator and updated as appropriate.   Patient Active Problem List   Diagnosis Date Noted  . Recurrent UTI 06/28/2020  . Inflammatory arthritis 05/16/2020  . Erosive gastritis   . Benign  neoplasm of ascending colon   . Heme + stool   . Change in bowel habit   . Anemia 05/01/2020  . Dyspnea 05/01/2020  . Generalized weakness 05/01/2020  . UTI (urinary tract infection) 05/01/2020  . Symptomatic anemia 05/01/2020  . Glaucoma of both eyes   . NASH (nonalcoholic steatohepatitis)   . Mitral valve prolapse   . Thymoma -- managed by [facility] - extensive documentation in Care Everywhere 03/22/2019  . Breast cancer ([place]) 03/18/2019  . Mass of upper lobe of right lung 01/20/2019  . B12 deficiency 12/19/2018  . Stress incontinence in female 12/19/2018  . Dyslipidemia 12/14/2018  . OSA (obstructive sleep apnea) 12/07/2018  . History of  breast cancer 12/07/2018  . Cognitive complaints 11/25/2018  . False positive stress test 01/29/2015  . Migraine without aura and without status migrainosus, not intractable 12/18/2014  . Small vessel disease, cerebrovascular 12/18/2014  . GERD (gastroesophageal reflux disease) 12/02/2014  . HTN (hypertension) 12/27/2013  . Anxiety 12/27/2013  . Depression 11/03/2012  . Elevated LFTs 11/03/2012  . Fibromyalgia 11/03/2012  . IBS (irritable bowel syndrome) 11/03/2012  . Lymphocytic colitis 11/03/2012  . Paget's disease of nipple ([place]) 11/03/2012  . Vitamin D deficiency 11/03/2012  . Osteoarthritis 11/03/2012    Social History   Tobacco Use  . Smoking status: Never Smoker  . Smokeless tobacco: Never Used  Vaping Use  . Vaping Use: Never used  Substance Use Topics  . Alcohol use: No  . Drug use: No    Current Medications and Allergies:    Current Outpatient Medications:  .  ALPRAZolam (XANAX) 0.25 MG tablet, Take 0.5 tablets (0.125 mg total) by mouth daily as needed for anxiety., Disp: 30 tablet, Rfl: 0 .  Ascorbic Acid (VITAMIN C) 1000 MG tablet, Take 1,000 mg by mouth daily., Disp: , Rfl:  .  atorvastatin (LIPITOR) 40 MG tablet, TAKE 1 TABLET BY MOUTH AT  BEDTIME (Patient taking differently: Take 40 mg by mouth at bedtime. ), Disp: 90 tablet, Rfl: 3 .  cephALEXin (KEFLEX) 500 MG capsule, 1 tab by mouth every Mon and Thur, Disp: 24 capsule, Rfl: 0 .  Cholecalciferol (VITAMIN D-3 PO), Take 1 capsule by mouth daily., Disp: , Rfl:  .  cyanocobalamin (,VITAMIN B-12,) 1000 MCG/ML injection, Inject 1 mL (1,000 mcg total) into the muscle every 30 (thirty) days., Disp: 3 mL, Rfl: 2 .  ferrous sulfate 325 (65 FE) MG tablet, Take 325 mg by mouth every other day. , Disp: , Rfl:  .  gabapentin (NEURONTIN) 300 MG capsule, Take 300 mg by mouth 3 (three) times daily. , Disp: , Rfl:  .  irbesartan (AVAPRO) 150 MG tablet, TAKE 1 TABLET BY MOUTH  DAILY (Patient taking differently: Take 150 mg by  mouth in the morning. ), Disp: 90 tablet, Rfl: 3 .  ondansetron (ZOFRAN) 8 MG tablet, Take 8 mg by mouth every 8 (eight) hours as needed for nausea or vomiting., Disp: , Rfl:  .  pantoprazole (PROTONIX) 40 MG tablet, Take 1 tablet (40 mg total) by mouth daily before breakfast., Disp: 30 tablet, Rfl: 0 .  PARoxetine (PAXIL) 20 MG tablet, TAKE 3 TABLETS BY MOUTH IN  THE MORNING (Patient taking differently: Take 60 mg by mouth in the morning. ), Disp: 270 tablet, Rfl: 3 .  polyethylene glycol powder (GLYCOLAX/MIRALAX) 17 GM/SCOOP powder, Take 17 g by mouth daily as needed for mild constipation., Disp: , Rfl:  .  SUMAtriptan (IMITREX) 100 MG tablet, Take 1 tablet (100  mg total) by mouth 2 (two) times daily as needed. At least 2 hrs between doses as needed bid (Patient taking differently: Take 100 mg by mouth 2 (two) times daily as needed for migraine or headache (and may repeat once in 2 hours, if no relief). ), Disp: 10 tablet, Rfl: 3 .  SYRINGE-NEEDLE, DISP, 3 ML 25G X 1" 3 ML MISC, Use to inject Vit B12 once a month., Disp: 12 each, Rfl: 1 .  triamcinolone cream (KENALOG) 0.1 %, Apply to affected area 1-2 times daily, Disp: 30 g, Rfl: 0 .  valACYclovir (VALTREX) 1000 MG tablet, Take two tablets daily x 5 days. Then for future outbreaks, take 2 tablets by mouth every 12 hours for 1 day total, Disp: 60 tablet, Rfl: 0 .  vitamin E (VITAMIN E) 180 MG (400 UNITS) capsule, Take 400 Units by mouth daily., Disp: , Rfl:    Allergies  Allergen Reactions  . Latex Rash and Other (See Comments)    Blisters, also    Review of Systems   ROS  Negative unless otherwise specified per HPI.  Vitals:   Vitals:   08/29/20 0902  BP: 122/78  Pulse: 80  Temp: (!) 97.1 F (36.2 C)  TempSrc: Temporal  SpO2: 96%  Weight: 166 lb 4 oz (75.4 kg)  Height: 5' 3"  (1.6 m)     Body mass index is 29.45 kg/m.   Physical Exam:    Physical Exam Vitals and nursing note reviewed.  Constitutional:      General: She  is not in acute distress.    Appearance: She is well-developed. She is not ill-appearing or toxic-appearing.  Cardiovascular:     Rate and Rhythm: Normal rate and regular rhythm.     Pulses: Normal pulses.     Heart sounds: Normal heart sounds, S1 normal and S2 normal.     Comments: No LE edema Pulmonary:     Effort: Pulmonary effort is normal.     Breath sounds: Normal breath sounds.  Skin:    General: Skin is warm and dry.  Neurological:     Mental Status: She is alert.     GCS: GCS eye subscore is 4. GCS verbal subscore is 5. GCS motor subscore is 6.  Psychiatric:        Speech: Speech normal.        Behavior: Behavior normal. Behavior is cooperative.      Assessment and Plan:    Autumn Conley was seen today for anemia.  Diagnoses and all orders for this visit:  Symptomatic anemia Update CBC and ferritin today. Will advise on supplementation changes as indicated. Overall symptoms are much more mild than in the past, if continues to worsen, please let us know. -     CBC with Differential/Platelet; Future -     Ferritin; Future -     Ferritin -     CBC with Differential/Platelet  Elevated liver enzymes Update CMP today given recent increase and regular tylenol use. -     Comprehensive metabolic panel; Future -     Comprehensive metabolic panel  Breast pain Oncologist is aware. Has imaging next month. Okay to continue topical CBD roll-on as this is helping her quite a bit.  Incontinence of feces, unspecified fecal incontinence type Resolved.  CMA or LPN served as scribe during this visit. History, Physical, and Plan performed by medical provider. The above documentation has been reviewed and is accurate and complete.  [name], PA-C [place]  08/29/2020  Follow-up: No follow-ups on file.

## 2020-08-30 LAB — COMPREHENSIVE METABOLIC PANEL
AG Ratio: 1.7 (calc) (ref 1.0–2.5)
ALT: 24 U/L (ref 6–29)
AST: 27 U/L (ref 10–35)
Albumin: 4.3 g/dL (ref 3.6–5.1)
Alkaline phosphatase (APISO): 123 U/L (ref 37–153)
BUN: 17 mg/dL (ref 7–25)
CO2: 28 mmol/L (ref 20–32)
Calcium: 10 mg/dL (ref 8.6–10.4)
Chloride: 103 mmol/L (ref 98–110)
Creat: 0.88 mg/dL (ref 0.60–0.93)
Globulin: 2.5 g/dL (calc) (ref 1.9–3.7)
Glucose, Bld: 103 mg/dL — ABNORMAL HIGH (ref 65–99)
Potassium: 5.1 mmol/L (ref 3.5–5.3)
Sodium: 139 mmol/L (ref 135–146)
Total Bilirubin: 0.4 mg/dL (ref 0.2–1.2)
Total Protein: 6.8 g/dL (ref 6.1–8.1)

## 2020-08-30 LAB — CBC WITH DIFFERENTIAL/PLATELET
Absolute Monocytes: 505 cells/uL (ref 200–950)
Basophils Absolute: 49 cells/uL (ref 0–200)
Basophils Relative: 1 %
Eosinophils Absolute: 78 cells/uL (ref 15–500)
Eosinophils Relative: 1.6 %
HCT: 33.4 % — ABNORMAL LOW (ref 35.0–45.0)
Hemoglobin: 11.1 g/dL — ABNORMAL LOW (ref 11.7–15.5)
Lymphs Abs: 735 cells/uL — ABNORMAL LOW (ref 850–3900)
MCH: 30 pg (ref 27.0–33.0)
MCHC: 33.2 g/dL (ref 32.0–36.0)
MCV: 90.3 fL (ref 80.0–100.0)
MPV: 9.4 fL (ref 7.5–12.5)
Monocytes Relative: 10.3 %
Neutro Abs: 3533 cells/uL (ref 1500–7800)
Neutrophils Relative %: 72.1 %
Platelets: 252 10*3/uL (ref 140–400)
RBC: 3.7 10*6/uL — ABNORMAL LOW (ref 3.80–5.10)
RDW: 16.4 % — ABNORMAL HIGH (ref 11.0–15.0)
Total Lymphocyte: 15 %
WBC: 4.9 10*3/uL (ref 3.8–10.8)

## 2020-08-30 LAB — FERRITIN: Ferritin: 29 ng/mL (ref 16–288)

## 2020-09-10 ENCOUNTER — Encounter: Payer: Self-pay | Admitting: Internal Medicine

## 2020-09-10 ENCOUNTER — Other Ambulatory Visit: Payer: Self-pay

## 2020-09-10 ENCOUNTER — Ambulatory Visit (INDEPENDENT_AMBULATORY_CARE_PROVIDER_SITE_OTHER): Payer: Medicare Other | Admitting: Internal Medicine

## 2020-09-10 VITALS — BP 143/78 | HR 72 | Ht 63.0 in | Wt 170.2 lb

## 2020-09-10 DIAGNOSIS — R071 Chest pain on breathing: Secondary | ICD-10-CM | POA: Diagnosis not present

## 2020-09-10 DIAGNOSIS — D4989 Neoplasm of unspecified behavior of other specified sites: Secondary | ICD-10-CM

## 2020-09-10 DIAGNOSIS — D649 Anemia, unspecified: Secondary | ICD-10-CM

## 2020-09-10 DIAGNOSIS — R55 Syncope and collapse: Secondary | ICD-10-CM | POA: Diagnosis not present

## 2020-09-10 DIAGNOSIS — E785 Hyperlipidemia, unspecified: Secondary | ICD-10-CM

## 2020-09-10 DIAGNOSIS — G4733 Obstructive sleep apnea (adult) (pediatric): Secondary | ICD-10-CM | POA: Diagnosis not present

## 2020-09-10 DIAGNOSIS — R002 Palpitations: Secondary | ICD-10-CM | POA: Diagnosis not present

## 2020-09-10 DIAGNOSIS — K7581 Nonalcoholic steatohepatitis (NASH): Secondary | ICD-10-CM | POA: Diagnosis not present

## 2020-09-10 DIAGNOSIS — I10 Essential (primary) hypertension: Secondary | ICD-10-CM

## 2020-09-10 NOTE — Progress Notes (Signed)
Cardiology Office Note:    Date:  09/10/2020   ID:  Autumn Conley, DOB 02/14/1946, MRN [id number]  PCP:  [name], PA  Cardiologist:  No primary care provider on file.  Electrophysiologist:  None   Referring MD: [name], PA   Chief Complaint/Reason for Referral: Presyncope and palpitations  History of Present Illness:    Autumn Conley is a 74 y.o. female with a history of metastatic thyoma being followed by Oncology, Hx of Pagets disease of breast, HTN, HLD, anxiety, IBS, NASH, fibromyalgia who presents for follow up of generalized weakness, dizziness, shortness of breath with exertion. Symptomatic anemia related to malignancy, now started on iron supplementation. May need IV iron per her report. Feeling slightly better on iron therapy. Dec 7 sees Dr. Mindi Junker for follow up of thymoma.   Hemoglobin is low - started on iron. 10 episodes of presyncope since last visit on 06/15/20 - not as severe anymore. Improved after iron and SOB improved. 1-2 x a week episodes of presyncope. No syncope or severe episodes like the time she fell backward into car.   Having significant chest pain, quality seems pleuritic, may be related to thymoma or therapy. Review with Onc. Worsens with position change and inspiration.   Reviewed monitor results which show infrequent ectopy associated with symptoms. Discussed that this can be influenced by systemic issues and anemia. Discussed indications for symptomatic treatment.    Past Medical History:  Diagnosis Date  . Anemia   . Anxiety   . Depression   . Dyspnea   . Fibromyalgia    "some; not chronic" (11/29/2014)  . GERD (gastroesophageal reflux disease)   . Glaucoma of both eyes   . Headache   . Hypertension   . IBS (irritable bowel syndrome)   . Mitral valve prolapse   . NASH (nonalcoholic steatohepatitis)   . Osteoarthritis   . PONV (postoperative nausea and vomiting)   . Stroke ([place])   . 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[facility])     Past Surgical  History:  Procedure Laterality Date  . ABDOMINAL HYSTERECTOMY  1980  . APPENDECTOMY  1953  . BIOPSY  05/03/2020   Procedure: BIOPSY;  Surgeon: Gatha Mayer, MD;  Location: [place];  Service: Endoscopy;;  . BREAST BIOPSY Left   . BREAST LUMPECTOMY Left   . CATARACT EXTRACTION, BILATERAL  2018  . COLONOSCOPY WITH PROPOFOL N/A 05/03/2020   Procedure: COLONOSCOPY WITH PROPOFOL;  Surgeon: Gatha Mayer, MD;  [facility] ENDOSCOPY;  Service: Endoscopy;  Laterality: N/A;  . ESOPHAGOGASTRODUODENOSCOPY (EGD) WITH PROPOFOL N/A 05/03/2020   Procedure: ESOPHAGOGASTRODUODENOSCOPY (EGD) WITH PROPOFOL;  Surgeon: Gatha Mayer, MD;  Location: [place];  Service: Endoscopy;  Laterality: N/A;  . MASTECTOMY Left ~ 2009  . PARASTERNAL EXPLORATION Right 02/14/2019   Procedure: PARASTERNAL MEDIAL EXPLORATION WITH BIOPIES.;  Surgeon: [name], MD;  Location: [place];  Service: Thoracic;  Laterality: Right;  . POLYPECTOMY  05/03/2020   Procedure: POLYPECTOMY;  Surgeon: Gatha Mayer, MD;  Location: [facility] ENDOSCOPY;  Service: Endoscopy;;    Current Medications: Current Meds  Medication Sig  . ALPRAZolam (XANAX) 0.25 MG tablet Take 0.5 tablets (0.125 mg total) by mouth daily as needed for anxiety.  . Ascorbic Acid (VITAMIN C) 1000 MG tablet Take 1,000 mg by mouth daily.  Marland Kitchen atorvastatin (LIPITOR) 40 MG tablet TAKE 1 TABLET BY MOUTH AT  BEDTIME (Patient taking differently: Take 40 mg by mouth at bedtime. )  . cephALEXin (KEFLEX) 500 MG capsule 1 tab  by mouth every Mon and Thur  . Cholecalciferol (VITAMIN D-3 PO) Take 1 capsule by mouth daily.  . cyanocobalamin (,VITAMIN B-12,) 1000 MCG/ML injection Inject 1 mL (1,000 mcg total) into the muscle every 30 (thirty) days.  . ferrous sulfate 325 (65 FE) MG tablet Take 325 mg by mouth every other day.   . gabapentin (NEURONTIN) 300 MG capsule Take 300 mg by mouth 3 (three) times daily.   . irbesartan (AVAPRO) 150 MG tablet TAKE 1 TABLET BY MOUTH   DAILY (Patient taking differently: Take 150 mg by mouth in the morning. )  . ondansetron (ZOFRAN) 8 MG tablet Take 8 mg by mouth every 8 (eight) hours as needed for nausea or vomiting.  . pantoprazole (PROTONIX) 40 MG tablet Take 1 tablet (40 mg total) by mouth daily before breakfast.  . PARoxetine (PAXIL) 20 MG tablet TAKE 3 TABLETS BY MOUTH IN  THE MORNING (Patient taking differently: Take 60 mg by mouth in the morning. )  . polyethylene glycol powder (GLYCOLAX/MIRALAX) 17 GM/SCOOP powder Take 17 g by mouth daily as needed for mild constipation.  . SUMAtriptan (IMITREX) 100 MG tablet Take 1 tablet (100 mg total) by mouth 2 (two) times daily as needed. At least 2 hrs between doses as needed bid (Patient taking differently: Take 100 mg by mouth 2 (two) times daily as needed for migraine or headache (and may repeat once in 2 hours, if no relief). )  . SYRINGE-NEEDLE, DISP, 3 ML 25G X 1" 3 ML MISC Use to inject Vit B12 once a month.  . valACYclovir (VALTREX) 1000 MG tablet Take two tablets daily x 5 days. Then for future outbreaks, take 2 tablets by mouth every 12 hours for 1 day total  . vitamin E (VITAMIN E) 180 MG (400 UNITS) capsule Take 400 Units by mouth daily.     Allergies:   Latex   Social History   Tobacco Use  . Smoking status: Never Smoker  . Smokeless tobacco: Never Used  Vaping Use  . Vaping Use: Never used  Substance Use Topics  . Alcohol use: No  . Drug use: No     Family History: The patient's family history includes Lung cancer (age of onset: 56) in her mother; Prostate cancer (age of onset: 60) in her brother.  ROS:   Please see the history of present illness.    All other systems reviewed and are negative.  EKGs/Labs/Other Studies Reviewed:    The following studies were reviewed today:  EKG:  NSR  Recent Labs: 05/01/2020: TSH 2.14 05/03/2020: Magnesium 2.0 08/29/2020: ALT 24; BUN 17; Creat 0.88; Hemoglobin 11.1; Platelets 252; Potassium 5.1; Sodium 139   Recent Lipid Panel    Component Value Date/Time   CHOL 181 02/27/2020 0932   TRIG 159.0 (H) 02/27/2020 0932   HDL 38.60 (L) 02/27/2020 0932   CHOLHDL 5 02/27/2020 0932   VLDL 31.8 02/27/2020 0932   LDLCALC 111 (H) 02/27/2020 0932    Physical Exam:    VS:  BP (!) 143/78 (BP Location: Right Arm, Patient Position: Sitting)   Pulse 72   Ht 5' 3"  (1.6 m)   Wt 170 lb 3.2 oz (77.2 kg)   SpO2 95%   BMI 30.15 kg/m     Wt Readings from Last 5 Encounters:  09/10/20 170 lb 3.2 oz (77.2 kg)  08/29/20 166 lb 4 oz (75.4 kg)  07/02/20 170 lb (77.1 kg)  06/28/20 169 lb (76.7 kg)  06/15/20 171 lb 9.6 oz (  77.8 kg)    Constitutional: No acute distress Eyes: sclera non-icteric, normal conjunctiva and lids ENMT: normal dentition, moist mucous membranes Cardiovascular: regular rhythm, normal rate, no murmurs. S1 and S2 normal. Radial pulses normal bilaterally. No jugular venous distention.  Respiratory: clear to auscultation bilaterally GI : normal bowel sounds, soft and nontender. No distention.   MSK: extremities warm, well perfused. No edema.  NEURO: grossly nonfocal exam, moves all extremities. PSYCH: alert and oriented x 3, normal mood and affect.   ASSESSMENT:    1. Palpitations   2. Pre-syncope   3. Anemia, unspecified type   4. Chest pain on breathing   5. Essential hypertension   6. Hyperlipidemia, unspecified hyperlipidemia type   7. Thymoma -- managed by [facility] - extensive documentation in Care Everywhere   8. NASH (nonalcoholic steatohepatitis)   9. OSA (obstructive sleep apnea)    PLAN:    Palpitations - Plan: EKG 12-Lead Pre-syncope Anemia, unspecified type - palpitations and presyncope improved by iron therapy dictated by oncology.   Chest pain - pleuritic in nature. No worrisome features on recent echo. May be related to mediastinal pathology with thymoma. If worsens will consider ischemic testing however, at this time we have agreed she will pursue her follow up  with Onc which will include surveillance imaging.  Essential hypertension - overall stable. Can continue irbesartan, consider dose reduction if presyncope worsens despite correction of anemia.   Hyperlipidemia, unspecified hyperlipidemia type - can continue atrovastatin.    Total time of encounter: 30 minutes total time of encounter, including 25 minutes spent in face-to-face patient care on the date of this encounter. This time includes coordination of care and counseling regarding above mentioned problem list. Remainder of non-face-to-face time involved reviewing chart documents/testing relevant to the patient encounter and documentation in the medical record. I have independently reviewed documentation from referring provider.   [name], MD Mobile  [facility]    Medication Adjustments/Labs and Tests Ordered: Current medicines are reviewed at length with the patient today.  Concerns regarding medicines are outlined above.   Orders Placed This Encounter  Procedures  . EKG 12-Lead    No orders of the defined types were placed in this encounter.   Patient Instructions  Medication Instructions:  No Changes In Medications at this time.   *If you need a refill on your cardiac medications before your next appointment, please call your pharmacy*  Follow-Up: At [facility] you and your health needs are our priority.  As part of our continuing mission to provide you with exceptional heart care, we have created designated Provider Care Teams.  These Care Teams include your primary Cardiologist (physician) and Advanced Practice Providers (APPs -  Physician Assistants and Nurse Practitioners) who all work together to provide you with the care you need, when you need it.  Your next appointment:   3 month(s)  The format for your next appointment:   In Person  Provider:   [name], MD

## 2020-09-10 NOTE — Patient Instructions (Signed)
Medication Instructions:  No Changes In Medications at this time.   *If you need a refill on your cardiac medications before your next appointment, please call your pharmacy*  Follow-Up: At [facility], you and your health needs are our priority.  As part of our continuing mission to provide you with exceptional heart care, we have created designated Provider Care Teams.  These Care Teams include your primary Cardiologist (physician) and Advanced Practice Providers (APPs -  Physician Assistants and Nurse Practitioners) who all work together to provide you with the care you need, when you need it.  Your next appointment:   3 month(s)  The format for your next appointment:   In Person  Provider:   [name], MD

## 2020-09-17 ENCOUNTER — Telehealth: Payer: Self-pay

## 2020-09-17 MED ORDER — PAROXETINE HCL 20 MG PO TABS
60.0000 mg | ORAL_TABLET | Freq: Every morning | ORAL | 0 refills | Status: DC
Start: 2020-09-17 — End: 2020-09-19

## 2020-09-17 NOTE — Telephone Encounter (Signed)
Pt states her paxil prescription isn't going to come in from mail order in time. She is asking for a month prescription be sent in to the [facility] in [place]. Pt states she is on her last pill and says this is urgent.

## 2020-09-17 NOTE — Telephone Encounter (Signed)
Rx sent to pharmacy as requested.

## 2020-09-19 ENCOUNTER — Other Ambulatory Visit: Payer: Self-pay | Admitting: Physician Assistant

## 2020-09-19 MED ORDER — PAROXETINE HCL 20 MG PO TABS: 60.0000 mg | ORAL_TABLET | Freq: Every morning | ORAL | 3 refills | Status: DC

## 2020-09-20 DIAGNOSIS — Z23 Encounter for immunization: Secondary | ICD-10-CM | POA: Diagnosis not present

## 2020-09-25 DIAGNOSIS — R918 Other nonspecific abnormal finding of lung field: Secondary | ICD-10-CM | POA: Diagnosis not present

## 2020-09-25 DIAGNOSIS — I313 Pericardial effusion (noninflammatory): Secondary | ICD-10-CM | POA: Diagnosis not present

## 2020-09-25 DIAGNOSIS — Z79899 Other long term (current) drug therapy: Secondary | ICD-10-CM | POA: Diagnosis not present

## 2020-09-25 DIAGNOSIS — Z91040 Latex allergy status: Secondary | ICD-10-CM | POA: Diagnosis not present

## 2020-09-25 DIAGNOSIS — D508 Other iron deficiency anemias: Secondary | ICD-10-CM | POA: Diagnosis not present

## 2020-09-25 DIAGNOSIS — D4989 Neoplasm of unspecified behavior of other specified sites: Secondary | ICD-10-CM | POA: Diagnosis not present

## 2020-09-25 DIAGNOSIS — C37 Malignant neoplasm of thymus: Secondary | ICD-10-CM | POA: Diagnosis not present

## 2020-09-25 DIAGNOSIS — R079 Chest pain, unspecified: Secondary | ICD-10-CM | POA: Diagnosis not present

## 2020-09-25 DIAGNOSIS — D499 Neoplasm of unspecified behavior of unspecified site: Secondary | ICD-10-CM | POA: Diagnosis not present

## 2020-10-08 DIAGNOSIS — G893 Neoplasm related pain (acute) (chronic): Secondary | ICD-10-CM | POA: Diagnosis not present

## 2020-10-08 DIAGNOSIS — D4989 Neoplasm of unspecified behavior of other specified sites: Secondary | ICD-10-CM | POA: Diagnosis not present

## 2020-11-20 ENCOUNTER — Other Ambulatory Visit: Payer: Self-pay | Admitting: Family Medicine

## 2020-11-22 NOTE — Telephone Encounter (Signed)
Autumn Conley's patient 

## 2020-11-28 ENCOUNTER — Telehealth: Payer: Self-pay | Admitting: Internal Medicine

## 2020-11-28 ENCOUNTER — Telehealth: Payer: Self-pay

## 2020-11-28 NOTE — Telephone Encounter (Signed)
Pt c/o Shortness Of Breath: STAT if SOB developed within the last 24 hours or pt is noticeably SOB on the phone  1. Are you currently SOB (can you hear that pt is SOB on the phone)? No.  2. How long have you been experiencing SOB? For a couple weeks  3. Are you SOB when sitting or when up moving around? With exertion.  4. Are you currently experiencing any other symptoms? Patient is calling in stating that she is experiencing SOB more than usual. She would like to speak with Dr. Delphina Cahill nurse about this matter. Please advise.

## 2020-11-28 NOTE — Telephone Encounter (Signed)
Spoke with patient who reports increasing SOB over the past couple of weeks. Denies SOB at rest, typically happens on exertion. Patient has occasional increase in HR that accompanies SOB episodes. Patient denies swelling, weight gain or CP. She is requesting to be seen in office before her March appointment. OV made with Dr. Lujean Rave tomorrow 2/10 at 3:40 pm. Patient grateful for appointment and aware to call back with any new or worsening concerns.

## 2020-11-28 NOTE — Telephone Encounter (Signed)
Advised patient last night to go to ED.   Nurse Assessment Nurse: Jimmye Norman, RN, Armen Pickup Date/Time Eilene Ghazi Time): 11/27/2020 4:17:07 PM Confirm and document reason for call. If symptomatic, describe symptoms. ---Caller states she is having SOB when up walking a short distance. Gets out of breath when talking. Been going for about 2 weeks. No cough, runny nose, or congestion. No V/D. Has a pulse ox at home, this morning it was 93%. Does the patient have any new or worsening symptoms? ---Yes Will a triage be completed? ---Yes Related visit to physician within the last 2 weeks? ---No Does the PT have any chronic conditions? (i.e. diabetes, asthma, this includes High risk factors for pregnancy, etc.) ---Yes List chronic conditions. ---HTN, Thynoma CA, High Cholesterol Is this a behavioral health or substance abuse call? ---No Guidelines Guideline Title Affirmed Question Affirmed Notes Nurse Date/Time (Eastern Time) Breathing Difficulty [1] MILD difficulty breathing (e.g., minimal/ no SOB at rest, SOB with walking, pulse <100) Jimmye Norman, RN, Armen Pickup 11/27/2020 4:23:37 PM PLEASE NOTE: All timestamps contained within this report are represented as [place] Standard Time. CONFIDENTIALTY NOTICE: This fax transmission is intended only for the addressee. It contains information that is legally privileged, confidential or otherwise protected from use or disclosure. If you are not the intended recipient, you are strictly prohibited from reviewing, disclosing, copying using or disseminating any of this information or taking any action in reliance on or regarding this information. If you have received this fax in error, please notify us immediately by telephone so that we can arrange for its return to [place]. Phone: [phone], Toll-Free: [phone], Fax: [phone] Page: 2 of 2 Call Id: [id number] Guidelines Guideline Title Affirmed Question Affirmed Notes Nurse Date/Time Eilene Ghazi Time) AND [2]  NEW-onset or WORSE than normal Disp. Time Eilene Ghazi Time) Disposition Final User 11/27/2020 4:14:52 PM Send to Urgent Dione Plover 11/27/2020 4:33:55 PM See HCP within 4 Hours (or PCP triage) Yes Jimmye Norman, RN, Armen Pickup Caller Disagree/Comply Comply Caller Understands Yes PreDisposition Call Doctor Care Advice Given Per Guideline SEE HCP (OR PCP TRIAGE) WITHIN 4 HOURS: CALL BACK IF: * You become worse CARE ADVICE given per Breathing Difficulty (Adult) guideline. Comments User: Armen Pickup, Jimmye Norman, RN Date/Time Eilene Ghazi Time): 11/27/2020 4:26:48 PM O2 sats were 86% yesterday, went up when sat down and took breaths. Referrals Warm transfer to backlin

## 2020-11-29 ENCOUNTER — Other Ambulatory Visit: Payer: Self-pay

## 2020-11-29 ENCOUNTER — Ambulatory Visit (INDEPENDENT_AMBULATORY_CARE_PROVIDER_SITE_OTHER): Payer: Medicare Other

## 2020-11-29 ENCOUNTER — Other Ambulatory Visit: Payer: Self-pay | Admitting: Internal Medicine

## 2020-11-29 ENCOUNTER — Ambulatory Visit: Payer: Medicare Other | Admitting: Internal Medicine

## 2020-11-29 ENCOUNTER — Ambulatory Visit (INDEPENDENT_AMBULATORY_CARE_PROVIDER_SITE_OTHER): Payer: Medicare Other | Admitting: Internal Medicine

## 2020-11-29 ENCOUNTER — Encounter: Payer: Self-pay | Admitting: Internal Medicine

## 2020-11-29 VITALS — BP 136/74 | HR 90 | Ht 63.0 in | Wt 164.2 lb

## 2020-11-29 DIAGNOSIS — R000 Tachycardia, unspecified: Secondary | ICD-10-CM

## 2020-11-29 DIAGNOSIS — I10 Essential (primary) hypertension: Secondary | ICD-10-CM | POA: Diagnosis not present

## 2020-11-29 DIAGNOSIS — I341 Nonrheumatic mitral (valve) prolapse: Secondary | ICD-10-CM | POA: Diagnosis not present

## 2020-11-29 DIAGNOSIS — I209 Angina pectoris, unspecified: Secondary | ICD-10-CM | POA: Diagnosis not present

## 2020-11-29 DIAGNOSIS — R002 Palpitations: Secondary | ICD-10-CM

## 2020-11-29 DIAGNOSIS — E785 Hyperlipidemia, unspecified: Secondary | ICD-10-CM | POA: Diagnosis not present

## 2020-11-29 DIAGNOSIS — R0600 Dyspnea, unspecified: Secondary | ICD-10-CM | POA: Diagnosis not present

## 2020-11-29 DIAGNOSIS — R0609 Other forms of dyspnea: Secondary | ICD-10-CM

## 2020-11-29 DIAGNOSIS — D649 Anemia, unspecified: Secondary | ICD-10-CM

## 2020-11-29 MED ORDER — METOPROLOL TARTRATE 25 MG PO TABS: 12.5000 mg | ORAL_TABLET | Freq: Two times a day (BID) | ORAL | 3 refills | Status: DC

## 2020-11-29 NOTE — Progress Notes (Signed)
Cardiology Office Note:    Date:  11/29/2020   ID:  Autumn, [place] 19-Oct-1946, MRN [id number]  PCP:  Autumn Coke, PA  Cardiologist:  No primary care provider on file.  Electrophysiologist:  None   Referring MD: Autumn Coke, PA   Chief Complaint/Reason for Referral: Follow up presyncope, palpitations  History of Present Illness:    Autumn [place] is a 75 y.o. female with a history of metastatic thyoma being followed by Oncology, Hx of Pagets disease of breast, HTN, HLD, anxiety, IBS, NASH, fibromyalgia who presents for follow up of generalized weakness, dizziness, shortness of breath with exertion. Symptomatic anemia related to malignancy, now started on iron supplementation and IV iron per her report.   The instablilty and dizziness and presyncope improved. Stopped gabapentin.   SOB and heart racing when standing. SOB with exertion.   Mild anemia persists. Last scan was unremakrable.   Past Medical History:  Diagnosis Date   Anemia    Anxiety    Depression    Dyspnea    Fibromyalgia    "some; not chronic" (11/29/2014)   GERD (gastroesophageal reflux disease)    Glaucoma of both eyes    Headache    Hypertension    IBS (irritable bowel syndrome)    Mitral valve prolapse    NASH (nonalcoholic steatohepatitis)    Osteoarthritis    PONV (postoperative nausea and vomiting)    Stroke ([place])    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[facility])     Past Surgical History:  Procedure Laterality Date   [place]   BIOPSY  05/03/2020   Procedure: BIOPSY;  Surgeon: Autumn Mayer, MD;  Location: [facility];  Service: Endoscopy;;   BREAST BIOPSY Left    BREAST LUMPECTOMY Left    CATARACT EXTRACTION, BILATERAL  2018   COLONOSCOPY WITH PROPOFOL N/A 05/03/2020   Procedure: COLONOSCOPY WITH PROPOFOL;  Surgeon: Autumn Mayer, MD;  Location: [place];  Service: Endoscopy;  Laterality: N/A;   ESOPHAGOGASTRODUODENOSCOPY (EGD) WITH PROPOFOL N/A 05/03/2020    Procedure: ESOPHAGOGASTRODUODENOSCOPY (EGD) WITH PROPOFOL;  Surgeon: Autumn Mayer, MD;  Location: [place];  Service: Endoscopy;  Laterality: N/A;   MASTECTOMY Left ~ 2009   PARASTERNAL EXPLORATION Right 02/14/2019   Procedure: PARASTERNAL MEDIAL EXPLORATION WITH BIOPIES.;  Surgeon: Autumn Isaac, MD;  Location: [place];  Service: Thoracic;  Laterality: Right;   POLYPECTOMY  05/03/2020   Procedure: POLYPECTOMY;  Surgeon: Autumn Mayer, MD;  [facility] ENDOSCOPY;  Service: Endoscopy;;    Current Medications: Current Meds  Medication Sig   ALPRAZolam (XANAX) 0.25 MG tablet Take 0.5 tablets (0.125 mg total) by mouth daily as needed for anxiety.   Ascorbic Acid (VITAMIN C) 1000 MG tablet Take 1,000 mg by mouth daily.   atorvastatin (LIPITOR) 40 MG tablet TAKE 1 TABLET BY MOUTH AT  BEDTIME   cephALEXin (KEFLEX) 500 MG capsule 1 tab by mouth every Mon and Thur   Cholecalciferol (VITAMIN D-3 PO) Take 1 capsule by mouth daily.   cyanocobalamin (,VITAMIN B-12,) 1000 MCG/ML injection Inject 1 mL (1,000 mcg total) into the muscle every 30 (thirty) days.   irbesartan (AVAPRO) 150 MG tablet TAKE 1 TABLET BY MOUTH  DAILY   ondansetron (ZOFRAN) 8 MG tablet Take 8 mg by mouth every 8 (eight) hours as needed for nausea or vomiting.   pantoprazole (PROTONIX) 40 MG tablet Take 1 tablet (40 mg total) by mouth daily before breakfast.   PARoxetine (PAXIL) 20 MG  tablet Take 3 tablets (60 mg total) by mouth in the morning.   polyethylene glycol powder (GLYCOLAX/MIRALAX) 17 GM/SCOOP powder Take 17 g by mouth daily as needed for mild constipation.   SUMAtriptan (IMITREX) 100 MG tablet Take 1 tablet (100 mg total) by mouth 2 (two) times daily as needed. At least 2 hrs between doses as needed bid (Patient taking differently: Take 100 mg by mouth 2 (two) times daily as needed for migraine or headache (and may repeat once in 2 hours, if no relief).)   SYRINGE-NEEDLE, DISP, 3 ML 25G X 1" 3 ML MISC Use to inject  Vit B12 once a month.   triamcinolone cream (KENALOG) 0.1 % Apply to affected area 1-2 times daily   valACYclovir (VALTREX) 1000 MG tablet Take two tablets daily x 5 days. Then for future outbreaks, take 2 tablets by mouth every 12 hours for 1 day total   vitamin E 180 MG (400 UNITS) capsule Take 400 Units by mouth daily.     Allergies:   Latex   Social History   Tobacco Use   Smoking status: Never Smoker   Smokeless tobacco: Never Used  Vaping Use   Vaping Use: Never used  Substance Use Topics   Alcohol use: No   Drug use: No     Family History: The patient's family history includes Lung cancer (age of onset: 99) in her mother; Prostate cancer (age of onset: 65) in her brother.  ROS:   Please see the history of present illness.    All other systems reviewed and are negative.  EKGs/Labs/Other Studies Reviewed:    The following studies were reviewed today:  EKG:  NSR, biatrial enlargement   Recent Labs: 05/01/2020: TSH 2.14 05/03/2020: Magnesium 2.0 08/29/2020: ALT 24; BUN 17; Creat 0.88; Hemoglobin 11.1; Platelets 252; Potassium 5.1; Sodium 139  Recent Lipid Panel    Component Value Date/Time   CHOL 181 02/27/2020 0932   TRIG 159.0 (H) 02/27/2020 0932   HDL 38.60 (L) 02/27/2020 0932   CHOLHDL 5 02/27/2020 0932   VLDL 31.8 02/27/2020 0932   LDLCALC 111 (H) 02/27/2020 0932    Physical Exam:    VS:  BP 136/74    Pulse 90    Ht 5' 3"  (1.6 m)    Wt 164 lb 3.2 oz (74.5 kg)    BMI 29.09 kg/m     Wt Readings from Last 5 Encounters:  11/29/20 164 lb 3.2 oz (74.5 kg)  09/10/20 170 lb 3.2 oz (77.2 kg)  08/29/20 166 lb 4 oz (75.4 kg)  07/02/20 170 lb (77.1 kg)  06/28/20 169 lb (76.7 kg)    Constitutional: No acute distress Eyes: sclera non-icteric, normal conjunctiva and lids ENMT: normal dentition, moist mucous membranes Cardiovascular: regular rhythm, normal rate, no murmurs. S1 and S2 normal. Radial pulses normal bilaterally. No jugular venous distention.   Respiratory: clear to auscultation bilaterally GI : normal bowel sounds, soft and nontender. No distention.   MSK: extremities warm, well perfused. No edema.  NEURO: grossly nonfocal exam, moves all extremities. PSYCH: alert and oriented x 3, normal mood and affect.   ASSESSMENT:    1. Palpitations   2. Essential hypertension   3. Anemia, unspecified type   4. Hyperlipidemia, unspecified hyperlipidemia type   5. Mitral valve prolapse   6. Angina pectoris, unspecified ([place])    7. Dyspnea on exertion    PLAN:    Palpitations - Plan: EKG 12-Lead, ECHOCARDIOGRAM COMPLETE - will trial BB for symptoms.  Consider cardiac monitor.  Angina pectoris, unspecified ([place])  - Plan: MYOCARDIAL PERFUSION IMAGING Dyspnea - Dyspnea and angina may be secondary to anemia but need to exclude ischemia. Will evaluate with echo and stress test.    Total time of encounter: 52 minutes total time of encounter, including 30 minutes spent in face-to-face patient care on the date of this encounter. This time includes coordination of care and counseling regarding above mentioned problem list. Remainder of non-face-to-face time involved reviewing chart documents/testing relevant to the patient encounter and documentation in the medical record. I have independently reviewed documentation from referring provider.   [name], MD [place]   [facility]    Medication Adjustments/Labs and Tests Ordered: Current medicines are reviewed at length with the patient today.  Concerns regarding medicines are outlined above.   Orders Placed This Encounter  Procedures   Cardiac Stress Test: Informed Consent Details: Physician/Practitioner Attestation; Transcribe to consent form and obtain patient signature   MYOCARDIAL PERFUSION IMAGING   EKG 12-Lead   ECHOCARDIOGRAM COMPLETE    Shared Decision Making/Informed Consent:   Shared Decision Making/Informed Consent The risks [chest pain, shortness of breath,  cardiac arrhythmias, dizziness, blood pressure fluctuations, myocardial infarction, stroke/transient ischemic attack, nausea, vomiting, allergic reaction, radiation exposure, metallic taste sensation and life-threatening complications (estimated to be 1 in 10,000)], benefits (risk stratification, diagnosing coronary artery disease, treatment guidance) and alternatives of a nuclear stress test were discussed in detail with Ms. Abreu and she agrees to proceed.   Meds ordered this encounter  Medications   metoprolol tartrate (LOPRESSOR) 25 MG tablet    Sig: Take 0.5 tablets (12.5 mg total) by mouth 2 (two) times daily.    Dispense:  90 tablet    Refill:  3    Patient Instructions  Medication Instructions:   -Start taking metoprolol tartrate 12.69m twice daily.   *If you need a refill on your cardiac medications before your next appointment, please call your pharmacy*   Testing/Procedures: Your physician has requested that you have an echocardiogram. Echocardiography is a painless test that uses sound waves to create images of your heart. It provides your doctor with information about the size and shape of your heart and how well your hearts chambers and valves are working. This procedure takes approximately one hour. There are no restrictions for this procedure. This procedure will be done at [address].  Your physician has requested that you have a lexiscan myoview. For further information please visit [email or web address] Please follow instruction sheet, as given.  This procedure will be done at [address].  How to prepare for your Myocardial Perfusion Test: Do not eat or drink 3 hours prior to your test, except you may have water. Do not consume products containing caffeine (regular or decaffeinated) 12 hours prior to your test. (ex: coffee, chocolate, sodas, tea). Do bring a list of your current medications with you.  If not listed below, you may take  your medications as normal. Do wear comfortable clothes (no dresses or overalls) and walking shoes, tennis shoes preferred (No heels or open toe shoes are allowed). Do NOT wear cologne, perfume, aftershave, or lotions (deodorant is allowed). The test will take approximately 3 to 4 hours to complete If these instructions are not followed, your test will have to be rescheduled.  Your physician has recommended that you wear an event monitor. Event monitors are medical devices that record the hearts electrical activity. Doctors most often [place]  these monitors to diagnose arrhythmias. Arrhythmias are problems with the speed or rhythm of the heartbeat. The monitor is a small, portable device. You can wear one while you do your normal daily activities. This is usually used to diagnose what is causing palpitations/syncope (passing out).   Follow-Up: At [facility], you and your health needs are our priority.  As part of our continuing mission to provide you with exceptional heart care, we have created designated Provider Care Teams.  These Care Teams include your primary Cardiologist (physician) and Advanced Practice Providers (APPs -  Physician Assistants and Nurse Practitioners) who all work together to provide you with the care you need, when you need it.  We recommend signing up for the patient portal called "MyChart".  Sign up information is provided on this After Visit Summary.  MyChart is used to connect with patients for Virtual Visits (Telemedicine).  Patients are able to view lab/test results, encounter notes, upcoming appointments, etc.  Non-urgent messages can be sent to your provider as well.   To learn more about what you can do with MyChart, go to [email or web address].    Your next appointment:   4-6 week(s)  The format for your next appointment:   In Person  Provider:   [name], MD

## 2020-11-29 NOTE — Patient Instructions (Signed)
Medication Instructions:   -Start taking metoprolol tartrate 12.94m twice daily.   *If you need a refill on your cardiac medications before your next appointment, please call your pharmacy*   Testing/Procedures: Your physician has requested that you have an echocardiogram. Echocardiography is a painless test that uses sound waves to create images of your heart. It provides your doctor with information about the size and shape of your heart and how well your heart's chambers and valves are working. This procedure takes approximately one hour. There are no restrictions for this procedure. This procedure will be done at [address].  Your physician has requested that you have a lexiscan myoview. For further information please visit [email or web address] Please follow instruction sheet, as given.  This procedure will be done at [address].  How to prepare for your Myocardial Perfusion Test:  Do not eat or drink 3 hours prior to your test, except you may have water.  Do not consume products containing caffeine (regular or decaffeinated) 12 hours prior to your test. (ex: coffee, chocolate, sodas, tea).  Do bring a list of your current medications with you.  If not listed below, you may take your medications as normal.  Do wear comfortable clothes (no dresses or overalls) and walking shoes, tennis shoes preferred (No heels or open toe shoes are allowed).  Do NOT wear cologne, perfume, aftershave, or lotions (deodorant is allowed).  The test will take approximately 3 to 4 hours to complete  If these instructions are not followed, your test will have to be rescheduled.  Your physician has recommended that you wear an event monitor. Event monitors are medical devices that record the heart's electrical activity. Doctors most often [place] monitors to diagnose arrhythmias. Arrhythmias are problems with the speed or rhythm of the heartbeat. The monitor is a small,  portable device. You can wear one while you do your normal daily activities. This is usually used to diagnose what is causing palpitations/syncope (passing out).   Follow-Up: [facility] you and your health needs are our priority.  As part of our continuing mission to provide you with exceptional heart care, we have created designated Provider Care Teams.  These Care Teams include your primary Cardiologist (physician) and Advanced Practice Providers (APPs -  Physician Assistants and Nurse Practitioners) who all work together to provide you with the care you need, when you need it.  We recommend signing up for the patient portal called "MyChart".  Sign up information is provided on this After Visit Summary.  MyChart is used to connect with patients for Virtual Visits (Telemedicine).  Patients are able to view lab/test results, encounter notes, upcoming appointments, etc.  Non-urgent messages can be sent to your provider as well.   To learn more about what you can do with MyChart, go to [email or web address]    Your next appointment:   4-6 week(s)  The format for your next appointment:   In Person  Provider:   GCherlynn Kaiser MD

## 2020-11-30 NOTE — Telephone Encounter (Signed)
FYI

## 2020-12-04 DIAGNOSIS — R000 Tachycardia, unspecified: Secondary | ICD-10-CM

## 2020-12-04 DIAGNOSIS — R002 Palpitations: Secondary | ICD-10-CM | POA: Diagnosis not present

## 2020-12-05 ENCOUNTER — Other Ambulatory Visit: Payer: Self-pay | Admitting: Family Medicine

## 2020-12-05 DIAGNOSIS — R002 Palpitations: Secondary | ICD-10-CM | POA: Diagnosis not present

## 2020-12-05 DIAGNOSIS — R000 Tachycardia, unspecified: Secondary | ICD-10-CM | POA: Diagnosis not present

## 2020-12-07 NOTE — Telephone Encounter (Signed)
Sam's patient 

## 2020-12-10 ENCOUNTER — Telehealth (HOSPITAL_COMMUNITY): Payer: Self-pay | Admitting: *Deleted

## 2020-12-10 NOTE — Telephone Encounter (Signed)
Patient given detailed instructions per Myocardial Perfusion Study Information Sheet for the test on 12/14/20. Patient notified to arrive 15 minutes early and that it is imperative to arrive on time for appointment to keep from having the test rescheduled.  If you need to cancel or reschedule your appointment, please call the office within 24 hours of your appointment. . Patient verbalized understanding. Kirstie Peri

## 2020-12-14 ENCOUNTER — Ambulatory Visit (HOSPITAL_COMMUNITY): Payer: Medicare Other | Attending: Cardiovascular Disease

## 2020-12-14 ENCOUNTER — Other Ambulatory Visit: Payer: Self-pay

## 2020-12-14 DIAGNOSIS — I209 Angina pectoris, unspecified: Secondary | ICD-10-CM | POA: Insufficient documentation

## 2020-12-14 DIAGNOSIS — I10 Essential (primary) hypertension: Secondary | ICD-10-CM | POA: Diagnosis not present

## 2020-12-14 DIAGNOSIS — D649 Anemia, unspecified: Secondary | ICD-10-CM | POA: Insufficient documentation

## 2020-12-14 DIAGNOSIS — R002 Palpitations: Secondary | ICD-10-CM | POA: Diagnosis not present

## 2020-12-14 DIAGNOSIS — E785 Hyperlipidemia, unspecified: Secondary | ICD-10-CM | POA: Diagnosis not present

## 2020-12-14 LAB — MYOCARDIAL PERFUSION IMAGING
LV dias vol: 42 mL (ref 46–106)
LV sys vol: 7 mL
Peak HR: 88 {beats}/min
Rest HR: 85 {beats}/min
SDS: 1
SRS: 0
SSS: 1
TID: 0.99

## 2020-12-14 MED ORDER — TECHNETIUM TC 99M TETROFOSMIN IV KIT
30.9000 | PACK | Freq: Once | INTRAVENOUS | Status: AC | PRN
  Administered 2020-12-14: 30.9 via INTRAVENOUS
  Filled 2020-12-14: qty 31

## 2020-12-14 MED ORDER — TECHNETIUM TC 99M TETROFOSMIN IV KIT
10.1000 | PACK | Freq: Once | INTRAVENOUS | Status: AC | PRN
  Administered 2020-12-14: 10.1 via INTRAVENOUS
  Filled 2020-12-14: qty 11

## 2020-12-14 MED ORDER — REGADENOSON 0.4 MG/5ML IV SOLN
0.4000 mg | Freq: Once | INTRAVENOUS | Status: AC
  Administered 2020-12-14: 0.4 mg via INTRAVENOUS

## 2020-12-21 ENCOUNTER — Other Ambulatory Visit: Payer: Self-pay

## 2020-12-21 ENCOUNTER — Ambulatory Visit (HOSPITAL_COMMUNITY): Payer: Medicare Other | Attending: Internal Medicine

## 2020-12-21 DIAGNOSIS — R002 Palpitations: Secondary | ICD-10-CM | POA: Insufficient documentation

## 2020-12-21 DIAGNOSIS — I341 Nonrheumatic mitral (valve) prolapse: Secondary | ICD-10-CM | POA: Diagnosis not present

## 2020-12-21 DIAGNOSIS — E785 Hyperlipidemia, unspecified: Secondary | ICD-10-CM | POA: Insufficient documentation

## 2020-12-21 LAB — ECHOCARDIOGRAM COMPLETE
AR max vel: 2.02 cm2
AV Area VTI: 2.2 cm2
AV Area mean vel: 1.94 cm2
AV Mean grad: 16 mmHg
AV Peak grad: 28.9 mmHg
Ao pk vel: 2.69 m/s
Area-P 1/2: 2.36 cm2
P 1/2 time: 517 msec
S' Lateral: 2 cm

## 2020-12-25 DIAGNOSIS — J701 Chronic and other pulmonary manifestations due to radiation: Secondary | ICD-10-CM | POA: Diagnosis not present

## 2020-12-25 DIAGNOSIS — R079 Chest pain, unspecified: Secondary | ICD-10-CM | POA: Diagnosis not present

## 2020-12-25 DIAGNOSIS — D4989 Neoplasm of unspecified behavior of other specified sites: Secondary | ICD-10-CM | POA: Diagnosis not present

## 2020-12-25 DIAGNOSIS — I313 Pericardial effusion (noninflammatory): Secondary | ICD-10-CM | POA: Diagnosis not present

## 2020-12-25 DIAGNOSIS — J8410 Pulmonary fibrosis, unspecified: Secondary | ICD-10-CM | POA: Diagnosis not present

## 2020-12-25 DIAGNOSIS — J90 Pleural effusion, not elsewhere classified: Secondary | ICD-10-CM | POA: Diagnosis not present

## 2020-12-25 DIAGNOSIS — C37 Malignant neoplasm of thymus: Secondary | ICD-10-CM | POA: Diagnosis not present

## 2020-12-26 ENCOUNTER — Other Ambulatory Visit: Payer: Self-pay

## 2020-12-26 ENCOUNTER — Encounter: Payer: Self-pay | Admitting: Internal Medicine

## 2020-12-26 ENCOUNTER — Ambulatory Visit (INDEPENDENT_AMBULATORY_CARE_PROVIDER_SITE_OTHER): Payer: Medicare Other | Admitting: Internal Medicine

## 2020-12-26 VITALS — BP 120/52 | HR 76 | Ht 63.0 in | Wt 165.6 lb

## 2020-12-26 DIAGNOSIS — I313 Pericardial effusion (noninflammatory): Secondary | ICD-10-CM

## 2020-12-26 DIAGNOSIS — R0600 Dyspnea, unspecified: Secondary | ICD-10-CM

## 2020-12-26 DIAGNOSIS — I3139 Other pericardial effusion (noninflammatory): Secondary | ICD-10-CM

## 2020-12-26 NOTE — Patient Instructions (Addendum)
Medication Instructions:  No Changes In Medications at this time.  *If you need a refill on your cardiac medications before your next appointment, please call your pharmacy*  Lab Work: TSH, ESR, CRP- TODAY  If you have labs (blood work) drawn today and your tests are completely normal, you will receive your results only by: Marland Kitchen MyChart Message (if you have MyChart) OR . A paper copy in the mail If you have any lab test that is abnormal or we need to change your treatment, we will call you to review the results.  Testing/Procedures: Your physician has requested that you have an echocardiogram in 2 weeks at either [address] or [facility]. Echocardiography is a painless test that uses sound waves to create images of your heart. It provides your doctor with information about the size and shape of your heart and how well your heart's chambers and valves are working. You may receive an ultrasound enhancing agent through an IV if needed to better visualize your heart during the echo.This procedure takes approximately one hour. There are no restrictions for this procedure. This will take place at the [address].   Follow-Up: At [facility], you and your health needs are our priority.  As part of our continuing mission to provide you with exceptional heart care, we have created designated Provider Care Teams.  These Care Teams include your primary Cardiologist (physician) and Advanced Practice Providers (APPs -  Physician Assistants and Nurse Practitioners) who all work together to provide you with the care you need, when you need it.  Your next appointment:   6 week(s)  The format for your next appointment:   In Person  Provider:   [name], MD  Other Instructions [place]

## 2020-12-26 NOTE — Progress Notes (Signed)
Cardiology Office Note:    Date:  12/26/2020   ID:  Autumn Conley, DOB 1946/07/25, MRN [id number]  PCP:  [name], PA  Cardiologist:  No primary care provider on file.  Electrophysiologist:  None   Referring MD: [name], PA   Chief Complaint/Reason for Referral: F/u chest pain, DOE, palp  History of Present Illness:    Autumn Conley is a 75 y.o. female with a history of metastatic thyoma being followed by Oncology, Hx of Pagets disease of breast, HTN, HLD, anxiety, IBS, NASH, fibromyalgia who presents for follow up. Symptomatic anemia related to malignancy, now started on iron supplementation with oral and IV iron.   Scan yestrday for surveillance of thymoma was good per her report. Will receive IV iron next week.   Reviewed stress test which was normal. Suspect symptom are not related to ischemia.   Moderate pericardial effusion. Patient was quite ill with viral illness in winter, suspect this could be viral pericardial effusion. No tamponade, remainder of echo reassuring though LV somewhat hyperdynamic.  Past Medical History:  Diagnosis Date   Anemia    Anxiety    Depression    Dyspnea    Fibromyalgia    "some; not chronic" (11/29/2014)   GERD (gastroesophageal reflux disease)    Glaucoma of both eyes    Headache    Hypertension    IBS (irritable bowel syndrome)    Mitral valve prolapse    NASH (nonalcoholic steatohepatitis)    Osteoarthritis    PONV (postoperative nausea and vomiting)    Stroke ([place])    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[facility])     Past Surgical History:  Procedure Laterality Date   [place]   BIOPSY  05/03/2020   Procedure: BIOPSY;  Surgeon: Gatha Mayer, MD;  Location: [place];  Service: Endoscopy;;   BREAST BIOPSY Left    BREAST LUMPECTOMY Left    CATARACT EXTRACTION, BILATERAL  2018   COLONOSCOPY WITH PROPOFOL N/A 05/03/2020   Procedure: COLONOSCOPY WITH PROPOFOL;  Surgeon: Gatha Mayer, MD;   Location: [place];  Service: Endoscopy;  Laterality: N/A;   ESOPHAGOGASTRODUODENOSCOPY (EGD) WITH PROPOFOL N/A 05/03/2020   Procedure: ESOPHAGOGASTRODUODENOSCOPY (EGD) WITH PROPOFOL;  Surgeon: Gatha Mayer, MD;  Location: [place];  Service: Endoscopy;  Laterality: N/A;   MASTECTOMY Left ~ 2009   PARASTERNAL EXPLORATION Right 02/14/2019   Procedure: PARASTERNAL MEDIAL EXPLORATION WITH BIOPIES.;  Surgeon: [name], MD;  Location: [place];  Service: Thoracic;  Laterality: Right;   POLYPECTOMY  05/03/2020   Procedure: POLYPECTOMY;  Surgeon: Gatha Mayer, MD;  [facility] ENDOSCOPY;  Service: Endoscopy;;    Current Medications: Current Meds  Medication Sig   ALPRAZolam (XANAX) 0.25 MG tablet Take 0.5 tablets (0.125 mg total) by mouth daily as needed for anxiety.   Ascorbic Acid (VITAMIN C) 1000 MG tablet Take 1,000 mg by mouth daily.   atorvastatin (LIPITOR) 40 MG tablet TAKE 1 TABLET BY MOUTH AT  BEDTIME   cephALEXin (KEFLEX) 500 MG capsule 1 tab by mouth every Mon and Thur   Cholecalciferol (VITAMIN D-3 PO) Take 1 capsule by mouth daily.   cyanocobalamin (,VITAMIN B-12,) 1000 MCG/ML injection Inject 1 mL (1,000 mcg total) into the muscle every 30 (thirty) days.   irbesartan (AVAPRO) 150 MG tablet TAKE 1 TABLET BY MOUTH  DAILY   ondansetron (ZOFRAN) 8 MG tablet Take 8 mg by mouth every 8 (eight) hours as needed for nausea or vomiting.  pantoprazole (PROTONIX) 40 MG tablet Take 1 tablet (40 mg total) by mouth daily before breakfast.   PARoxetine (PAXIL) 20 MG tablet TAKE 3 TABLETS BY MOUTH IN  THE MORNING   polyethylene glycol powder (GLYCOLAX/MIRALAX) 17 GM/SCOOP powder Take 17 g by mouth daily as needed for mild constipation.   SUMAtriptan (IMITREX) 100 MG tablet Take 1 tablet (100 mg total) by mouth 2 (two) times daily as needed. At least 2 hrs between doses as needed bid (Patient taking differently: Take 100 mg by mouth 2 (two) times daily as needed for migraine or  headache (and may repeat once in 2 hours, if no relief).)   SYRINGE-NEEDLE, DISP, 3 ML 25G X 1" 3 ML MISC Use to inject Vit B12 once a month.   triamcinolone cream (KENALOG) 0.1 % Apply to affected area 1-2 times daily   valACYclovir (VALTREX) 1000 MG tablet Take two tablets daily x 5 days. Then for future outbreaks, take 2 tablets by mouth every 12 hours for 1 day total   vitamin E 180 MG (400 UNITS) capsule Take 400 Units by mouth daily.   [DISCONTINUED] metoprolol tartrate (LOPRESSOR) 25 MG tablet Take 0.5 tablets (12.5 mg total) by mouth 2 (two) times daily.     Allergies:   Latex   Social History   Tobacco Use   Smoking status: Never Smoker   Smokeless tobacco: Never Used  Vaping Use   Vaping Use: Never used  Substance Use Topics   Alcohol use: No   Drug use: No     Family History: The patient's family history includes Lung cancer (age of onset: 67) in her mother; Prostate cancer (age of onset: 30) in her brother.  ROS:   Please see the history of present illness.    All other systems reviewed and are negative.  EKGs/Labs/Other Studies Reviewed:    The following studies were reviewed today:   Recent Labs: 05/01/2020: TSH 2.14 05/03/2020: Magnesium 2.0 08/29/2020: ALT 24; BUN 17; Creat 0.88; Hemoglobin 11.1; Platelets 252; Potassium 5.1; Sodium 139  Recent Lipid Panel    Component Value Date/Time   CHOL 181 02/27/2020 0932   TRIG 159.0 (H) 02/27/2020 0932   HDL 38.60 (L) 02/27/2020 0932   CHOLHDL 5 02/27/2020 0932   VLDL 31.8 02/27/2020 0932   LDLCALC 111 (H) 02/27/2020 0932    Physical Exam:    VS:  BP (!) 120/52   Pulse 76   Ht 5' 3"  (1.6 m)   Wt 165 lb 9.6 oz (75.1 kg)   SpO2 97%   BMI 29.33 kg/m     Wt Readings from Last 5 Encounters:  12/26/20 165 lb 9.6 oz (75.1 kg)  11/29/20 164 lb 3.2 oz (74.5 kg)  09/10/20 170 lb 3.2 oz (77.2 kg)  08/29/20 166 lb 4 oz (75.4 kg)  07/02/20 170 lb (77.1 kg)    Constitutional: No acute distress Eyes: sclera  non-icteric, normal conjunctiva and lids ENMT: normal dentition, moist mucous membranes Cardiovascular: regular rhythm, normal rate, no murmurs. S1 and S2 normal. Radial pulses normal bilaterally. No jugular venous distention.  Respiratory: clear to auscultation bilaterally GI : normal bowel sounds, soft and nontender. No distention.   MSK: extremities warm, well perfused. No edema.  NEURO: grossly nonfocal exam, moves all extremities. PSYCH: alert and oriented x 3, normal mood and affect.   ASSESSMENT:    1. Dyspnea, unspecified type   2. Pericardial effusion    PLAN:    Dyspnea, unspecified type - Plan: Ambulatory  referral to Pulmonology, Sed Rate (ESR), C-reactive protein, TSH, ECHOCARDIOGRAM LIMITED Pericardial effusion - Plan: ECHOCARDIOGRAM LIMITED - will obtain lab studies to ensure no other cause for new pericardial effusion, though I am most suspicious of viral etiology. Somewhat surprising that it has persisted for a few months since illness. With history of malignancy will want to consider malignant involvement though scans have been quiescent per her report. Future PET imaging may be informative. Repeat an echo in a few weeks for surveillance. - will also refer to pulmonary medicine given continued DOE without ischemia and no features of constriction.    Total time of encounter: 30 minutes total time of encounter, including 20 minutes spent in face-to-face patient care on the date of this encounter. This time includes coordination of care and counseling regarding above mentioned problem list. Remainder of non-face-to-face time involved reviewing chart documents/testing relevant to the patient encounter and documentation in the medical record. I have independently reviewed documentation from referring provider.   [name], MD, [facility]    Medication Adjustments/Labs and Tests Ordered: Current medicines are reviewed at length with the patient  today.  Concerns regarding medicines are outlined above.   Orders Placed This Encounter  Procedures   Sed Rate (ESR)   C-reactive protein   TSH   Ambulatory referral to Pulmonology   ECHOCARDIOGRAM LIMITED    No orders of the defined types were placed in this encounter.   Patient Instructions  Medication Instructions:  No Changes In Medications at this time.  *If you need a refill on your cardiac medications before your next appointment, please call your pharmacy*  Lab Work: TSH, ESR, CRP- TODAY  If you have labs (blood work) drawn today and your tests are completely normal, you will receive your results only by: [place] (if you have MyChart) OR A paper copy in the mail If you have any lab test that is abnormal or we need to change your treatment, we will call you to review the results.  Testing/Procedures: Your physician has requested that you have an echocardiogram in 2 weeks at either [address] or [facility]. Echocardiography is a painless test that uses sound waves to create images of your heart. It provides your doctor with information about the size and shape of your heart and how well your heart's chambers and valves are working. You may receive an ultrasound enhancing agent through an IV if needed to better visualize your heart during the echo.This procedure takes approximately one hour. There are no restrictions for this procedure. This will take place at the [address], [address].   Follow-Up: At [facility], you and your health needs are our priority.  As part of our continuing mission to provide you with exceptional heart care, we have created designated Provider Care Teams.  These Care Teams include your primary Cardiologist (physician) and Advanced Practice Providers (APPs -  Physician Assistants and Nurse Practitioners) who all work together to provide you with the care you need, when you need it.  Your next appointment:   6 week(s)  The format  for your next appointment:   In Person  Provider:   [name], MD  Other Instructions [place]

## 2020-12-27 DIAGNOSIS — R0600 Dyspnea, unspecified: Secondary | ICD-10-CM | POA: Diagnosis not present

## 2020-12-28 LAB — TSH: TSH: 2.12 u[IU]/mL (ref 0.450–4.500)

## 2020-12-28 LAB — C-REACTIVE PROTEIN: CRP: 5 mg/L (ref 0–10)

## 2020-12-28 LAB — SEDIMENTATION RATE: Sed Rate: 32 mm/hr (ref 0–40)

## 2021-01-01 DIAGNOSIS — D509 Iron deficiency anemia, unspecified: Secondary | ICD-10-CM | POA: Diagnosis not present

## 2021-01-03 ENCOUNTER — Telehealth: Payer: Self-pay

## 2021-01-03 DIAGNOSIS — D649 Anemia, unspecified: Secondary | ICD-10-CM | POA: Diagnosis not present

## 2021-01-03 DIAGNOSIS — R350 Frequency of micturition: Secondary | ICD-10-CM | POA: Diagnosis not present

## 2021-01-03 DIAGNOSIS — N390 Urinary tract infection, site not specified: Secondary | ICD-10-CM | POA: Diagnosis not present

## 2021-01-03 DIAGNOSIS — R0600 Dyspnea, unspecified: Secondary | ICD-10-CM | POA: Diagnosis not present

## 2021-01-03 NOTE — Telephone Encounter (Signed)
Relayed msg to pt. Pt verbalized understanding

## 2021-01-03 NOTE — Telephone Encounter (Signed)
Pt is wanting to come in and get her urine checked. She believes she has a UTI. We have nothing today and she does not think she can wait til tomorrow. Pt states Samantha told her to call if she needed anything at all.

## 2021-01-03 NOTE — Telephone Encounter (Signed)
[name] is out of the office, please schedule with another provider or tell pt she can go to Urgent care.

## 2021-01-08 DIAGNOSIS — C37 Malignant neoplasm of thymus: Secondary | ICD-10-CM | POA: Diagnosis not present

## 2021-01-09 ENCOUNTER — Encounter: Payer: Self-pay | Admitting: Urology

## 2021-01-09 ENCOUNTER — Ambulatory Visit (INDEPENDENT_AMBULATORY_CARE_PROVIDER_SITE_OTHER): Payer: Medicare Other | Admitting: Urology

## 2021-01-09 ENCOUNTER — Other Ambulatory Visit: Payer: Self-pay

## 2021-01-09 VITALS — BP 137/73 | HR 77 | Ht 63.0 in | Wt 163.0 lb

## 2021-01-09 DIAGNOSIS — N393 Stress incontinence (female) (male): Secondary | ICD-10-CM

## 2021-01-09 DIAGNOSIS — N390 Urinary tract infection, site not specified: Secondary | ICD-10-CM

## 2021-01-09 LAB — BLADDER SCAN AMB NON-IMAGING

## 2021-01-09 MED ORDER — ESTRADIOL 0.1 MG/GM VA CREA: TOPICAL_CREAM | VAGINAL | 12 refills | Status: DC

## 2021-01-09 NOTE — Patient Instructions (Signed)
Kegel Exercises  Kegel exercises can help strengthen your pelvic floor muscles. The pelvic floor is a group of muscles that support your rectum, small intestine, and bladder. In females, pelvic floor muscles also help support the womb (uterus). These muscles help you control the flow of urine and stool. Kegel exercises are painless and simple, and they do not require any equipment. Your provider may suggest Kegel exercises to:  Improve bladder and bowel control.  Improve sexual response.  Improve weak pelvic floor muscles after surgery to remove the uterus (hysterectomy) or pregnancy (females).  Improve weak pelvic floor muscles after prostate gland removal or surgery (males). Kegel exercises involve squeezing your pelvic floor muscles, which are the same muscles you squeeze when you try to stop the flow of urine or keep from passing gas. The exercises can be done while sitting, standing, or lying down, but it is best to vary your position. Exercises How to do Kegel exercises: 1. Squeeze your pelvic floor muscles tight. You should feel a tight lift in your rectal area. If you are a female, you should also feel a tightness in your vaginal area. Keep your stomach, buttocks, and legs relaxed. 2. Hold the muscles tight for up to 10 seconds. 3. Breathe normally. 4. Relax your muscles. 5. Repeat as told by your health care provider. Repeat this exercise daily as told by your health care provider. Continue to do this exercise for at least 4-6 weeks, or for as long as told by your health care provider. You may be referred to a physical therapist who can help you learn more about how to do Kegel exercises. Depending on your condition, your health care provider may recommend:  Varying how long you squeeze your muscles.  Doing several sets of exercises every day.  Doing exercises for several weeks.  Making Kegel exercises a part of your regular exercise routine. This information is not intended  to replace advice given to you by your health care provider. Make sure you discuss any questions you have with your health care provider. Document Revised: 02/10/2020 Document Reviewed: 05/26/2018 Elsevier Patient Education  [place].

## 2021-01-09 NOTE — Progress Notes (Signed)
01/09/21 11:11 AM   [name] Reason [birth year] [id number]   CC: Recurrent UTIs, stress incontinence  HPI: I saw [name] in urology clinic today for recurrent UTIs and stress incontinence.  She is a 75 year old female with thymoma treated with chemotherapy and radiation.  She has a history of recurrent UTIs that started in the fall 2021, and had a recurrence recently in March 2022.  This has been a lifelong problem of having around 2 UTIs per year.  Most recent urine culture shows E. coli on 01/03/2021.  Her UTI symptoms are foul-smelling urine, pelvic pressure, bladder spasm/pain.  She also has mild stress incontinence and wears a pad daily.  She notes that the symptoms worsen when she has a UTI, or if she has a cough.  The stress incontinence is minimally bothersome.  She previously was trialed on Keflex twice a week by her PCP for her recurrent UTIs, but unclear how long she was on this.  She had a CT performed in July 2021 that showed no evidence of hydronephrosis, stone disease, or other urologic abnormalities.    She has a distant history of a mastectomy for Paget's disease.  Urinalysis today is completely benign with 0 RBCs, 0 WBCs, no bacteria, nitrite negative.  PVR is normal at 0 mL.  PMH: Past Medical History:  Diagnosis Date   Anemia    Anxiety    Depression    Dyspnea    Fibromyalgia    "some; not chronic" (11/29/2014)   GERD (gastroesophageal reflux disease)    Glaucoma of both eyes    Headache    Hypertension    IBS (irritable bowel syndrome)    Mitral valve prolapse    NASH (nonalcoholic steatohepatitis)    Osteoarthritis    PONV (postoperative nausea and vomiting)    Stroke ([place])    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[facility])     Surgical History: Past Surgical History:  Procedure Laterality Date   [place]   BIOPSY  05/03/2020   Procedure: BIOPSY;  Surgeon: Gatha Mayer, MD;  Location: [place];  Service:  Endoscopy;;   BREAST BIOPSY Left    BREAST LUMPECTOMY Left    CATARACT EXTRACTION, BILATERAL  2018   COLONOSCOPY WITH PROPOFOL N/A 05/03/2020   Procedure: COLONOSCOPY WITH PROPOFOL;  Surgeon: Gatha Mayer, MD;  Location: [place];  Service: Endoscopy;  Laterality: N/A;   ESOPHAGOGASTRODUODENOSCOPY (EGD) WITH PROPOFOL N/A 05/03/2020   Procedure: ESOPHAGOGASTRODUODENOSCOPY (EGD) WITH PROPOFOL;  Surgeon: Gatha Mayer, MD;  Location: [place];  Service: Endoscopy;  Laterality: N/A;   MASTECTOMY Left ~ 2009   PARASTERNAL EXPLORATION Right 02/14/2019   Procedure: PARASTERNAL MEDIAL EXPLORATION WITH BIOPIES.;  Surgeon: [name], MD;  Location: [facility] OR;  Service: Thoracic;  Laterality: Right;   POLYPECTOMY  05/03/2020   Procedure: POLYPECTOMY;  Surgeon: Gatha Mayer, MD;  Location[facility] ENDOSCOPY;  Service: Endoscopy;;    Family History: Family History  Problem Relation Age of Onset   Lung cancer Mother 44   Prostate cancer Brother 72    Social History:  reports that she has never smoked. She has never used smokeless tobacco. She reports that she does not drink alcohol and does not use drugs.  Physical Exam: BP 137/73    Pulse 77    Ht 5' 3"  (1.6 m)    Wt 163 lb (73.9 kg)    BMI 28.87 kg/m    Constitutional:  Alert and oriented, No  acute distress. Cardiovascular: No clubbing, cyanosis, or edema. Respiratory: Normal respiratory effort, no increased work of breathing. GI: Abdomen is soft, nontender, nondistended, no abdominal masses  Laboratory Data: Reviewed, see HPI  Pertinent Imaging: I have personally viewed and interpreted the CT, see HPI.  No urologic abnormalities.  Assessment & Plan:   75 year old female with mild stress incontinence and recurrent UTIs, normal CT in July 2021.  We discussed the evaluation and treatment of patients with recurrent UTIs at length.  We specifically discussed the differences between asymptomatic bacteriuria and true  urinary tract infection.  We discussed the AUA definition of recurrent UTI of at least 2 culture proven symptomatic acute cystitis episodes in a 40-monthperiod, or 3 within a 1 year period.  We discussed the importance of culture directed antibiotic treatment, and antibiotic stewardship.  First-line therapy includes nitrofurantoin(5 days), Bactrim(3 days), or fosfomycin(3 g single dose).  Possible etiologies of recurrent infection include periurethral tissue atrophy in postmenopausal woman, constipation, sexual activity, incomplete emptying, anatomic abnormalities, and even genetic predisposition.  Finally, we discussed the role of perineal hygiene, timed voiding, adequate hydration, topical vaginal estrogen, cranberry prophylaxis, and low-dose antibiotic prophylaxis.  Start cranberry tablets and topical estrogen cream for recurrent UTIs Encouraged Kegel exercises for stress incontinence, consider pessary in the future if no improvement RTC 6 months symptom check  BNickolas Madrid MD 01/09/2021  [place] [place] [phone]

## 2021-01-10 LAB — MICROSCOPIC EXAMINATION
Bacteria, UA: NONE SEEN
RBC, Urine: NONE SEEN /hpf (ref 0–2)

## 2021-01-10 LAB — URINALYSIS, COMPLETE
Bilirubin, UA: NEGATIVE
Glucose, UA: NEGATIVE
Ketones, UA: NEGATIVE
Leukocytes,UA: NEGATIVE
Nitrite, UA: NEGATIVE
Protein,UA: NEGATIVE
RBC, UA: NEGATIVE
Specific Gravity, UA: 1.02 (ref 1.005–1.030)
Urobilinogen, Ur: 0.2 mg/dL (ref 0.2–1.0)
pH, UA: 5.5 (ref 5.0–7.5)

## 2021-01-11 ENCOUNTER — Ambulatory Visit (HOSPITAL_COMMUNITY): Payer: Medicare Other | Attending: Internal Medicine

## 2021-01-11 ENCOUNTER — Other Ambulatory Visit: Payer: Self-pay

## 2021-01-11 DIAGNOSIS — I3139 Other pericardial effusion (noninflammatory): Secondary | ICD-10-CM

## 2021-01-11 DIAGNOSIS — I313 Pericardial effusion (noninflammatory): Secondary | ICD-10-CM | POA: Diagnosis not present

## 2021-01-11 DIAGNOSIS — R0600 Dyspnea, unspecified: Secondary | ICD-10-CM | POA: Diagnosis not present

## 2021-01-11 LAB — ECHOCARDIOGRAM LIMITED
Area-P 1/2: 1.7 cm2
S' Lateral: 2.1 cm

## 2021-01-17 ENCOUNTER — Ambulatory Visit (INDEPENDENT_AMBULATORY_CARE_PROVIDER_SITE_OTHER): Payer: Medicare Other | Admitting: Pulmonary Disease

## 2021-01-17 ENCOUNTER — Other Ambulatory Visit: Payer: Self-pay

## 2021-01-17 ENCOUNTER — Encounter: Payer: Self-pay | Admitting: Pulmonary Disease

## 2021-01-17 VITALS — BP 130/78 | HR 76 | Temp 98.0°F | Ht 63.0 in | Wt 166.0 lb

## 2021-01-17 DIAGNOSIS — R0602 Shortness of breath: Secondary | ICD-10-CM

## 2021-01-17 NOTE — Patient Instructions (Signed)
Shortness of breath  Multiple factors likely contributing to your shortness of breath including .  Anemia which is better following transfusion .  Deconditioning-inability to do a lot of activities leading to muscle weakness .  Scarring in the lungs .  Fluid around the heart  We will get a breathing study to see what your lung functions are like  .  Low oxygen levels can contribute to getting short of breath but you stated that this is improved and you are not seeing your oxygen below 90  Graded exercises as tolerated will help  We will request for a copy of that CT you had recently  Follow-up with the heart doctor regarding the fluid around the heart  I will see you in 4 to 6 weeks, the breathing study can be done on the day of next visit  We may need a PET scan to further evaluate the findings in the lungs

## 2021-01-17 NOTE — Progress Notes (Signed)
Autumn Conley    [id number]    [birth year]  Primary Care Physician:Worley, [name], [place]  Referring Physician: [name], MD [address] [place],  [place] [address]  Chief complaint:   Shortness of breath of about 2 to 3 months duration  HPI:  Some improvement in his shortness of breath following iron transfusion She is noted to be severely anemic, iron pills did not work Received iron transfusions and does seem to have helped  Was recently hospitalized and had endoscopy performed in the work-up of iron deficiency anemia-negative findings.  She is short of breath with mild to moderate exertion Short of breath with walking up steps  Never smoker No history of alcohol use No occupational predisposition to lung disease  History of mastectomy for breast cancer  Denies significant arthritic pain or discomfort History of hypertension, hypercholesterolemia, sleep apnea  Has not been able to use a CPAP regularly only of recent  She does have an occasional cough History of anxiety   Outpatient Encounter Medications as of 01/17/2021  Medication Sig  . ALPRAZolam (XANAX) 0.25 MG tablet Take 0.5 tablets (0.125 mg total) by mouth daily as needed for anxiety.  . Ascorbic Acid (VITAMIN C) 1000 MG tablet Take 1,000 mg by mouth daily.  Marland Kitchen atorvastatin (LIPITOR) 40 MG tablet TAKE 1 TABLET BY MOUTH AT  BEDTIME  . benzonatate (TESSALON) 100 MG capsule Take 100 mg by mouth 2 (two) times daily as needed.  . Cholecalciferol (VITAMIN D-3 PO) Take 1 capsule by mouth daily.  . cyanocobalamin (,VITAMIN B-12,) 1000 MCG/ML injection Inject 1 mL (1,000 mcg total) into the muscle every 30 (thirty) days.  Marland Kitchen HYCODAN 5-1.5 MG/5ML syrup TAKE 5 MILLILITERS BY MOUTH EVERY 6 HOURS AS NEEDED  . irbesartan (AVAPRO) 150 MG tablet TAKE 1 TABLET BY MOUTH  DAILY  . ondansetron (ZOFRAN) 8 MG tablet Take 8 mg by mouth every 8 (eight) hours as needed for nausea or vomiting.  .  pantoprazole (PROTONIX) 40 MG tablet Take 1 tablet (40 mg total) by mouth daily before breakfast.  . PARoxetine (PAXIL) 20 MG tablet TAKE 3 TABLETS BY MOUTH IN  THE MORNING  . polyethylene glycol powder (GLYCOLAX/MIRALAX) 17 GM/SCOOP powder Take 17 g by mouth daily as needed for mild constipation.  . SUMAtriptan (IMITREX) 100 MG tablet Take 1 tablet (100 mg total) by mouth 2 (two) times daily as needed. At least 2 hrs between doses as needed bid (Patient taking differently: Take 100 mg by mouth 2 (two) times daily as needed for migraine or headache (and may repeat once in 2 hours, if no relief).)  . triamcinolone cream (KENALOG) 0.1 % Apply to affected area 1-2 times daily  . valACYclovir (VALTREX) 1000 MG tablet Take two tablets daily x 5 days. Then for future outbreaks, take 2 tablets by mouth every 12 hours for 1 day total  . vitamin E 180 MG (400 UNITS) capsule Take 400 Units by mouth daily.  . [DISCONTINUED] SYRINGE-NEEDLE, DISP, 3 ML 25G X 1" 3 ML MISC Use to inject Vit B12 once a month.  . cefdinir (OMNICEF) 300 MG capsule Take by mouth. (Patient not taking: Reported on 01/17/2021)  . estradiol (ESTRACE) 0.1 MG/GM vaginal cream Estrogen Cream Instruction Discard applicator Apply pea sized amount to tip of finger to urethra before bed. Wash hands well after application. Use Monday, Wednesday and Friday (Patient not taking: Reported on 01/17/2021)   No facility-administered encounter medications on  file as of 01/17/2021.    Allergies as of 01/17/2021 - Review Complete 01/17/2021  Allergen Reaction Noted  . Latex Rash and Other (See Comments) 05/01/2020    Past Medical History:  Diagnosis Date  . Anemia   . Anxiety   . Breast cancer ([place])   . Depression   . Dyspnea   . Fibromyalgia    "some; not chronic" (11/29/2014)  . GERD (gastroesophageal reflux disease)   . Glaucoma of both eyes   . Headache   . Hypertension   . IBS (irritable bowel syndrome)   . Mitral valve prolapse   . NASH  (nonalcoholic steatohepatitis)   . Osteoarthritis   . PONV (postoperative nausea and vomiting)   . Stroke ([place])   . 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[facility])     Past Surgical History:  Procedure Laterality Date  . ABDOMINAL HYSTERECTOMY  1980  . APPENDECTOMY  1953  . BIOPSY  05/03/2020   Procedure: BIOPSY;  Surgeon: Gatha Mayer, MD;  Location: [facility];  Service: Endoscopy;;  . BREAST BIOPSY Left   . BREAST LUMPECTOMY Left   . CATARACT EXTRACTION, BILATERAL  2018  . COLONOSCOPY WITH PROPOFOL N/A 05/03/2020   Procedure: COLONOSCOPY WITH PROPOFOL;  Surgeon: Gatha Mayer, MD;  Location: [facility] ENDOSCOPY;  Service: Endoscopy;  Laterality: N/A;  . ESOPHAGOGASTRODUODENOSCOPY (EGD) WITH PROPOFOL N/A 05/03/2020   Procedure: ESOPHAGOGASTRODUODENOSCOPY (EGD) WITH PROPOFOL;  Surgeon: Gatha Mayer, MD;  Location: [facility];  Service: Endoscopy;  Laterality: N/A;  . MASTECTOMY Left ~ 2009  . PARASTERNAL EXPLORATION Right 02/14/2019   Procedure: PARASTERNAL MEDIAL EXPLORATION WITH BIOPIES.;  Surgeon: [name], MD;  Location: [facility];  Service: Thoracic;  Laterality: Right;  . POLYPECTOMY  05/03/2020   Procedure: POLYPECTOMY;  Surgeon: Gatha Mayer, MD;  Location: [facility] ENDOSCOPY;  Service: Endoscopy;;    Family History  Problem Relation Age of Onset  . Lung cancer Mother 51  . Prostate cancer Brother 29  . Bladder Cancer Neg Hx   . Kidney cancer Neg Hx     Social History   Socioeconomic History  . Marital status: Married    Spouse name: Not on file  . Number of children: 1  . Years of education: 44  . Highest education level: Not on file  Occupational History  . Occupation: Retired  Tobacco Use  . Smoking status: Never Smoker  . Smokeless tobacco: Never Used  Vaping Use  . Vaping Use: Never used  Substance and Sexual Activity  . Alcohol use: No  . Drug use: No  . Sexual activity: Not Currently  Other Topics Concern  . Not on file  Social History Narrative   Lives at home with  her husband.   Right-handed.   2 cups caffeine/day.   Social Determinants of Health   Financial Resource Strain: Not on file  Food Insecurity: Not on file  Transportation Needs: Not on file  Physical Activity: Not on file  Stress: Not on file  Social Connections: Not on file  Intimate Partner Violence: Not on file    Review of Systems  Constitutional: Positive for fatigue.  Respiratory: Positive for cough and shortness of breath.   Psychiatric/Behavioral: Positive for sleep disturbance.    Vitals:   01/17/21 1431  BP: 130/78  Pulse: 76  Temp: 98 F (36.7 C)  SpO2: 95%     Physical Exam Constitutional:      Appearance: Normal appearance.  HENT:     Head: Normocephalic and atraumatic.  Nose: No congestion.     Mouth/Throat:     Mouth: Mucous membranes are moist.  Eyes:     General:        Right eye: No discharge.        Left eye: No discharge.  Cardiovascular:     Rate and Rhythm: Normal rate and regular rhythm.     Heart sounds: Murmur heard.  No friction rub.  Pulmonary:     Effort: No respiratory distress.     Breath sounds: No stridor. No wheezing or rhonchi.  Musculoskeletal:     Cervical back: No rigidity or tenderness.  Neurological:     Mental Status: She is alert.  Psychiatric:        Mood and Affect: Mood normal.    Data Reviewed: Echocardiogram -Showing moderate pericardial effusion, diastolic dysfunction, mitral stenosis  CT scan from July 2021 was reviewed showing anterior mediastinal mass CT scan chest from 12/25/2020 showing fibrosis, bronchiectasis, evolution of radiation fibrosis  Assessment:  Shortness of breath -Multifactorial .  Pulmonary fibrosis/bronchiectasis .  Anemia .  Deconditioning .  Moderate pericardial effusion  Plan/Recommendations: Recommended graded exercises as tolerated Patient to follow-up with cardiology regarding pericardial effusion   Follow-up on recent CT  Follow-up in 4 to 6 weeks  PET scan may  be needed to follow-up on CT findings  Encouraged to call with any significant concerns  Sherrilyn Rist MD [facility] 01/17/2021, 3:13 PM  CC: [name], MD

## 2021-01-29 ENCOUNTER — Ambulatory Visit (INDEPENDENT_AMBULATORY_CARE_PROVIDER_SITE_OTHER): Payer: Medicare Other | Admitting: Internal Medicine

## 2021-01-29 ENCOUNTER — Other Ambulatory Visit: Payer: Self-pay

## 2021-01-29 VITALS — BP 122/80 | HR 75 | Ht 63.0 in | Wt 165.0 lb

## 2021-01-29 DIAGNOSIS — I313 Pericardial effusion (noninflammatory): Secondary | ICD-10-CM

## 2021-01-29 DIAGNOSIS — D649 Anemia, unspecified: Secondary | ICD-10-CM | POA: Diagnosis not present

## 2021-01-29 DIAGNOSIS — R002 Palpitations: Secondary | ICD-10-CM | POA: Diagnosis not present

## 2021-01-29 DIAGNOSIS — I10 Essential (primary) hypertension: Secondary | ICD-10-CM | POA: Diagnosis not present

## 2021-01-29 DIAGNOSIS — I3139 Other pericardial effusion (noninflammatory): Secondary | ICD-10-CM

## 2021-01-29 DIAGNOSIS — R0600 Dyspnea, unspecified: Secondary | ICD-10-CM | POA: Diagnosis not present

## 2021-01-29 NOTE — Progress Notes (Signed)
Cardiology Office Note:    Date:  01/29/2021   ID:  Dai Apel, DOB November 19, 1945, MRN [id number]  PCP:  [name], PA  Cardiologist:  No primary care provider on file.  Electrophysiologist:  None   Referring MD: [name], PA   Chief Complaint/Reason for Referral: DOE, palpitations  History of Present Illness:    Autumn Conley is a 75 y.o. female with a history of metastatic thyoma being followed by Oncology, Hx of Pagets disease of breast, HTN, HLD, anxiety, IBS, NASH, fibromyalgia who presents for follow up of generalized weakness, dizziness, shortness of breath with exertion. Symptomatic anemia related to malignancy, now started on iron supplementation with IV iron.  She is accompanied by her husband for this visit, and he also contributes to her history. Since her previous visit, Dr. Mindi Junker performed 2 iron infusions, and she states that her shortness of breath has improved due to these infusions. She feels like she has overall improved since her last visit, but has not yet reached her baseline. She also reports getting very fatigued easily. She was on iron pills previously but noted having constipation due to the medication.    We performed an echocardiogram as part of the evaluation of palpitations and shortness of breath, and she was noted to have a moderate pericardial effusion.  This has not changed on close follow-up echo, and we discussed these results today.  She has a history of palpitations, however she has not had them as frequently. She believes her shortness of breath is "75% better," however her husband does not agree. He states she used to walk 5 miles a day but now gets "out of breath" walking to the car. She reports bendopnea postprandial shortness of breath.  Prior to the iron infusions, she wore a heart monitor and reports having significant episodes of shortness of breath which she documented on cardiac monitor. She was unable to walk from her chair to the  restroom without needing to sit again for a few minutes. She also was using a pulse-ox frequently but did not notice much improvement.   She reports having a cold over the Winter that lasted approximately 8 weeks, after Christmas. She had a significant cough that improved with 2 prescriptions from her PCP (Hydrocodone and Tessalon pearls).  We discussed that this could be one of the etiologies for her pericardial effusion given normal thyroid function and no other clear cause.  We discussed viral etiology for pericardial effusion and that this will likely resolve in time.  No features of cardiac tamponade on echo from 3/4 or 3/25.  Past Medical History:  Diagnosis Date  . Anemia   . Anxiety   . Breast cancer ([place])   . Depression   . Dyspnea   . Fibromyalgia    "some; not chronic" (11/29/2014)  . GERD (gastroesophageal reflux disease)   . Glaucoma of both eyes   . Headache   . Hypertension   . IBS (irritable bowel syndrome)   . Mitral valve prolapse   . NASH (nonalcoholic steatohepatitis)   . Osteoarthritis   . PONV (postoperative nausea and vomiting)   . Stroke ([place])   . 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[facility])     Past Surgical History:  Procedure Laterality Date  . ABDOMINAL HYSTERECTOMY  1980  . APPENDECTOMY  1953  . BIOPSY  05/03/2020   Procedure: BIOPSY;  Surgeon: Gatha Mayer, MD;  Location: [facility];  Service: Endoscopy;;  . BREAST BIOPSY Left   . BREAST LUMPECTOMY Left   .  CATARACT EXTRACTION, BILATERAL  2018  . COLONOSCOPY WITH PROPOFOL N/A 05/03/2020   Procedure: COLONOSCOPY WITH PROPOFOL;  Surgeon: Gatha Mayer, MD;  Location[facility] ENDOSCOPY;  Service: Endoscopy;  Laterality: N/A;  . ESOPHAGOGASTRODUODENOSCOPY (EGD) WITH PROPOFOL N/A 05/03/2020   Procedure: ESOPHAGOGASTRODUODENOSCOPY (EGD) WITH PROPOFOL;  Surgeon: Gatha Mayer, MD;  Location: [facility];  Service: Endoscopy;  Laterality: N/A;  . MASTECTOMY Left ~ 2009  . PARASTERNAL EXPLORATION Right 02/14/2019   Procedure:  PARASTERNAL MEDIAL EXPLORATION WITH BIOPIES.;  Surgeon: [name], MD;  Location: [facility];  Service: Thoracic;  Laterality: Right;  . POLYPECTOMY  05/03/2020   Procedure: POLYPECTOMY;  Surgeon: Gatha Mayer, MD;  Location: [facility] ENDOSCOPY;  Service: Endoscopy;;    Current Medications: Current Meds  Medication Sig  . ALPRAZolam (XANAX) 0.25 MG tablet Take 0.5 tablets (0.125 mg total) by mouth daily as needed for anxiety.  . Ascorbic Acid (VITAMIN C) 1000 MG tablet Take 1,000 mg by mouth daily.  Marland Kitchen atorvastatin (LIPITOR) 40 MG tablet TAKE 1 TABLET BY MOUTH AT  BEDTIME  . Cholecalciferol (VITAMIN D-3 PO) Take 1 capsule by mouth daily.  . cyanocobalamin (,VITAMIN B-12,) 1000 MCG/ML injection Inject 1 mL (1,000 mcg total) into the muscle every 30 (thirty) days.  Marland Kitchen estradiol (ESTRACE) 0.1 MG/GM vaginal cream Estrogen Cream Instruction Discard applicator Apply pea sized amount to tip of finger to urethra before bed. Wash hands well after application. Use Monday, Wednesday and Friday  . HYCODAN 5-1.5 MG/5ML syrup TAKE 5 MILLILITERS BY MOUTH EVERY 6 HOURS AS NEEDED  . irbesartan (AVAPRO) 150 MG tablet TAKE 1 TABLET BY MOUTH  DAILY  . ondansetron (ZOFRAN) 8 MG tablet Take 8 mg by mouth every 8 (eight) hours as needed for nausea or vomiting.  . pantoprazole (PROTONIX) 40 MG tablet Take 1 tablet (40 mg total) by mouth daily before breakfast.  . PARoxetine (PAXIL) 20 MG tablet TAKE 3 TABLETS BY MOUTH IN  THE MORNING  . polyethylene glycol powder (GLYCOLAX/MIRALAX) 17 GM/SCOOP powder Take 17 g by mouth daily as needed for mild constipation.  . SUMAtriptan (IMITREX) 100 MG tablet Take 1 tablet (100 mg total) by mouth 2 (two) times daily as needed. At least 2 hrs between doses as needed bid (Patient taking differently: Take 100 mg by mouth 2 (two) times daily as needed for migraine or headache (and may repeat once in 2 hours, if no relief).)  . valACYclovir (VALTREX) 1000 MG tablet Take two tablets daily x  5 days. Then for future outbreaks, take 2 tablets by mouth every 12 hours for 1 day total  . vitamin E 180 MG (400 UNITS) capsule Take 400 Units by mouth daily.     Allergies:   Latex   Social History   Tobacco Use  . Smoking status: Never Smoker  . Smokeless tobacco: Never Used  Vaping Use  . Vaping Use: Never used  Substance Use Topics  . Alcohol use: No  . Drug use: No     Family History: The patient's family history includes Lung cancer (age of onset: 97) in her mother; Prostate cancer (age of onset: 15) in her brother. There is no history of Bladder Cancer or Kidney cancer.  ROS:   Please see the history of present illness.  (+) Shortness of Breath (+) Fatigue (+) Palpitations   All other systems reviewed and are negative.  EKGs/Labs/Other Studies Reviewed:    The following studies were reviewed today:  EKG:  01/29/2021: EKG was  not ordered today.  Recent Labs: 05/03/2020: Magnesium 2.0 08/29/2020: ALT 24; BUN 17; Creat 0.88; Hemoglobin 11.1; Platelets 252; Potassium 5.1; Sodium 139 12/27/2020: TSH 2.120  Recent Lipid Panel    Component Value Date/Time   CHOL 181 02/27/2020 0932   TRIG 159.0 (H) 02/27/2020 0932   HDL 38.60 (L) 02/27/2020 0932   CHOLHDL 5 02/27/2020 0932   VLDL 31.8 02/27/2020 0932   LDLCALC 111 (H) 02/27/2020 0932    Physical Exam:    VS:  BP 122/80 (BP Location: Right Arm, Patient Position: Sitting, Cuff Size: Normal)   Pulse 75   Ht 5' 3"  (1.6 m)   Wt 165 lb (74.8 kg)   SpO2 96%   BMI 29.23 kg/m     Wt Readings from Last 5 Encounters:  01/29/21 165 lb (74.8 kg)  01/17/21 166 lb (75.3 kg)  01/09/21 163 lb (73.9 kg)  12/26/20 165 lb 9.6 oz (75.1 kg)  11/29/20 164 lb 3.2 oz (74.5 kg)    Constitutional: No acute distress Eyes: sclera non-icteric, normal conjunctiva and lids ENMT: normal dentition, moist mucous membranes Cardiovascular: regular rhythm, normal rate, 2/6 SEM. S1 and S2 normal. Radial pulses normal bilaterally. No  jugular venous distention.  Respiratory: clear to auscultation bilaterally GI : normal bowel sounds, soft and nontender. No distention.   MSK: extremities warm, well perfused. No edema.  NEURO: grossly nonfocal exam, moves all extremities. PSYCH: alert and oriented x 3, normal mood and affect.    ASSESSMENT:    1. Pericardial effusion   2. Dyspnea, unspecified type   3. Palpitations   4. Essential hypertension   5. Anemia, unspecified type    PLAN:    Pericardial effusion - Plan: ECHOCARDIOGRAM LIMITED -Her effusion is likely viral in nature given her protracted illness in the winter prior to recognition of a pericardial effusion.  Her echo from August 2021 does not show a significant effusion.  I suspect this will resolve on its own.  She has no definite symptoms of pericarditis.  We will repeat an echocardiogram in 8 weeks or sooner if she is symptomatic with signs of cardiac tamponade.  She understands this and will call me with any issues.  Dyspnea, unspecified type -Dyspnea on exertion continues but is improved after IV iron therapy.  I do not think a significant component of her dyspnea is coming from pericardial effusion, but if symptoms worsen I would want to exclude tamponade physiology.  No definite constrictive features on echo.  Recommend continuing to follow with oncology hematology for treatment of anemia which she feels has been the most helpful.  Palpitations -SVT on monitor with no atrial fibrillation.  We have tried beta-blockade in the past which she did not feel was helpful and felt that it caused relative bradycardia on her pulse oximeter.  This may have been due to ectopy and inaccurate reading, nevertheless she did not find beta-blockade helpful therefore we will not pursue this again.  We discussed that palpitations overall look benign on cardiac monitor, and these can be observed if no worsening symptoms.  No dizziness or syncope reported.  Essential  hypertension-blood pressure well controlled today, continue current therapy.  Anemia, unspecified type-under the care of hematology oncology Dr. Mindi Junker, she seems to be responding well to IV iron.  Last hemoglobin 9.5.  Total time of encounter: 40 minutes total time of encounter, including 25 minutes spent in face-to-face patient care on the date of this encounter. This time includes coordination of care and counseling  regarding above mentioned problem list. Remainder of non-face-to-face time involved reviewing chart documents/testing relevant to the patient encounter and documentation in the medical record. I have independently reviewed documentation from referring provider.   Medication Adjustments/Labs and Tests Ordered: Current medicines are reviewed at length with the patient today.  Concerns regarding medicines are outlined above.   Orders Placed This Encounter  Procedures  . ECHOCARDIOGRAM LIMITED    No orders of the defined types were placed in this encounter.   Patient Instructions  Medication Instructions:  No Changes In Medications at this time.  *If you need a refill on your cardiac medications before your next appointment, please call your pharmacy*  Testing/Procedures: Your physician has requested that you have an LIMITED echocardiogram IN 8 WEEKS. Echocardiography is a painless test that uses sound waves to create images of your heart. It provides your doctor with information about the size and shape of your heart and how well your heart's chambers and valves are working. You may receive an ultrasound enhancing agent through an IV if needed to better visualize your heart during the echo.This procedure takes approximately one hour. There are no restrictions for this procedure. This will take place at the [address], [address].   Follow-Up: At [facility], you and your health needs are our priority.  As part of our continuing mission to provide you with exceptional  heart care, we have created designated Provider Care Teams.  These Care Teams include your primary Cardiologist (physician) and Advanced Practice Providers (APPs -  Physician Assistants and Nurse Practitioners) who all work together to provide you with the care you need, when you need it.  We recommend signing up for the patient portal called "MyChart".  Sign up information is provided on this After Visit Summary.  MyChart is used to connect with patients for Virtual Visits (Telemedicine).  Patients are able to view lab/test results, encounter notes, upcoming appointments, etc.  Non-urgent messages can be sent to your provider as well.   To learn more about what you can do with MyChart, go to [email or web address].    Your next appointment:   AFTER ECHO  The format for your next appointment:   In Person  Provider:   [name], MD     [facility],acting as a scribe for [name], MD.,have documented all relevant documentation on the behalf of [name], MD,as directed by  [name], MD while in the presence of [name], MD.  I, [name], MD, have reviewed all documentation for this visit. The documentation on 01/29/21 for the exam, diagnosis, procedures, and orders are all accurate and complete.

## 2021-01-29 NOTE — Patient Instructions (Signed)
Medication Instructions:  No Changes In Medications at this time.  *If you need a refill on your cardiac medications before your next appointment, please call your pharmacy*  Testing/Procedures: Your physician has requested that you have an LIMITED echocardiogram IN 8 WEEKS. Echocardiography is a painless test that uses sound waves to create images of your heart. It provides your doctor with information about the size and shape of your heart and how well your heart's chambers and valves are working. You may receive an ultrasound enhancing agent through an IV if needed to better visualize your heart during the echo.This procedure takes approximately one hour. There are no restrictions for this procedure. This will take place at the [address].   Follow-Up: At [facility], you and your health needs are our priority.  As part of our continuing mission to provide you with exceptional heart care, we have created designated Provider Care Teams.  These Care Teams include your primary Cardiologist (physician) and Advanced Practice Providers (APPs -  Physician Assistants and Nurse Practitioners) who all work together to provide you with the care you need, when you need it.  We recommend signing up for the patient portal called "MyChart".  Sign up information is provided on this After Visit Summary.  MyChart is used to connect with patients for Virtual Visits (Telemedicine).  Patients are able to view lab/test results, encounter notes, upcoming appointments, etc.  Non-urgent messages can be sent to your provider as well.   To learn more about what you can do with MyChart, go to [email or web address].    Your next appointment:   AFTER ECHO  The format for your next appointment:   In Person  Provider:   [name], MD

## 2021-01-31 ENCOUNTER — Telehealth: Payer: Self-pay | Admitting: Physician Assistant

## 2021-01-31 NOTE — Chronic Care Management (AMB) (Signed)
  Chronic Care Management   Note  01/31/2021 Name: Autumn Conley MRN: [id number] DOB: 1946-04-28  Autumn Conley is a 75 y.o. year old female who is a primary care patient of [name], [place]. I reached out to [name] Reason by phone today in response to a referral sent by Autumn Conley's PCP, [name], [place].   Autumn Conley was given information about Chronic Care Management services today including:  1. CCM service includes personalized support from designated clinical staff supervised by her physician, including individualized plan of care and coordination with other care providers 2. 24/7 contact phone numbers for assistance for urgent and routine care needs. 3. Service will only be billed when office clinical staff spend 20 minutes or more in a month to coordinate care. 4. Only one practitioner may furnish and bill the service in a calendar month. 5. The patient may stop CCM services at any time (effective at the end of the month) by phone call to the office staff.   Patient agreed to services and verbal consent obtained.   Follow up plan:   Lauretta Grill [employer] Scheduler

## 2021-02-05 ENCOUNTER — Encounter: Payer: Self-pay | Admitting: Family

## 2021-02-05 ENCOUNTER — Ambulatory Visit (INDEPENDENT_AMBULATORY_CARE_PROVIDER_SITE_OTHER): Payer: Medicare Other | Admitting: Family

## 2021-02-05 ENCOUNTER — Other Ambulatory Visit: Payer: Self-pay

## 2021-02-05 VITALS — BP 147/70 | HR 72 | Temp 98.7°F | Ht 63.0 in | Wt 163.2 lb

## 2021-02-05 DIAGNOSIS — I209 Angina pectoris, unspecified: Secondary | ICD-10-CM

## 2021-02-05 DIAGNOSIS — R3915 Urgency of urination: Secondary | ICD-10-CM

## 2021-02-05 DIAGNOSIS — R350 Frequency of micturition: Secondary | ICD-10-CM | POA: Diagnosis not present

## 2021-02-05 LAB — POCT URINALYSIS DIPSTICK
Bilirubin, UA: POSITIVE
Blood, UA: NEGATIVE
Glucose, UA: NEGATIVE
Ketones, UA: POSITIVE
Nitrite, UA: NEGATIVE
Protein, UA: POSITIVE — AB
Spec Grav, UA: 1.025 (ref 1.010–1.025)
Urobilinogen, UA: 1 E.U./dL
pH, UA: 6 (ref 5.0–8.0)

## 2021-02-05 MED ORDER — SULFAMETHOXAZOLE-TRIMETHOPRIM 800-160 MG PO TABS: 1.0000 | ORAL_TABLET | Freq: Two times a day (BID) | ORAL | 0 refills | Status: DC

## 2021-02-05 NOTE — Progress Notes (Signed)
Acute Office Visit  Subjective:    Patient ID: Autumn Conley, female    DOB: 01/28/1946, 75 y.o.   MRN: [id number]  Chief Complaint  Patient presents with  . Urinary Frequency    With some discomfort when urinating. She says that she had one 1 month ago, and was treated with Antibiotic. Starting 2 weeks ago.    HPI Patient is in today with complaints of urinary frequency, urgency x2 weeks.  Patient reports being on antibiotics last month through [facility].  She is unsure what the medicine was.  Per the pharmacy, she was on levofloxacin.  She denies any fever, chills, abdominal pain or back pain.  Past Medical History:  Diagnosis Date  . Anemia   . Anxiety   . Breast cancer ([place])   . Depression   . Dyspnea   . Fibromyalgia    "some; not chronic" (11/29/2014)  . GERD (gastroesophageal reflux disease)   . Glaucoma of both eyes   . Headache   . Hypertension   . IBS (irritable bowel syndrome)   . Mitral valve prolapse   . NASH (nonalcoholic steatohepatitis)   . Osteoarthritis   . PONV (postoperative nausea and vomiting)   . Stroke ([place])   . 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[facility])     Past Surgical History:  Procedure Laterality Date  . ABDOMINAL HYSTERECTOMY  1980  . APPENDECTOMY  1953  . BIOPSY  05/03/2020   Procedure: BIOPSY;  Surgeon: Gatha Mayer, MD;  Location: [facility];  Service: Endoscopy;;  . BREAST BIOPSY Left   . BREAST LUMPECTOMY Left   . CATARACT EXTRACTION, BILATERAL  2018  . COLONOSCOPY WITH PROPOFOL N/A 05/03/2020   Procedure: COLONOSCOPY WITH PROPOFOL;  Surgeon: Gatha Mayer, MD;  [facility] END[facility]COPY;  Service: Endoscopy;  Laterality: N/A;  . ESOPHAGOGASTRODUODENOSCOPY (EGD) WITH PROPOFOL N/A 05/03/2020   Procedure: ESOPHAGOGASTRODUODENOSCOPY (EGD) WITH PROPOFOL;  Surgeon: Gatha Mayer, MD;  Location: [place];  Service: Endoscopy;  Laterality: N/A;  . MASTECTOMY Left ~ 2009  . PARASTERNAL EXPLORATION Right 02/14/2019   Procedure: PARASTERNAL  MEDIAL EXPLORATION WITH BIOPIES.;  Surgeon: [name], MD;  Location: [place];  Service: Thoracic;  Laterality: Right;  . POLYPECTOMY  05/03/2020   Procedure: POLYPECTOMY;  Surgeon: Gatha Mayer, MD;  Location: [facility] ENDOSCOPY;  Service: Endoscopy;;    Family History  Problem Relation Age of Onset  . Lung cancer Mother 15  . Prostate cancer Brother 62  . Bladder Cancer Neg Hx   . Kidney cancer Neg Hx     Social History   Socioeconomic History  . Marital status: Married    Spouse name: Not on file  . Number of children: 1  . Years of education: 82  . Highest education level: Not on file  Occupational History  . Occupation: Retired  Tobacco Use  . Smoking status: Never Smoker  . Smokeless tobacco: Never Used  Vaping Use  . Vaping Use: Never used  Substance and Sexual Activity  . Alcohol use: No  . Drug use: No  . Sexual activity: Not Currently  Other Topics Concern  . Not on file  Social History Narrative   Lives at home with her husband.   Right-handed.   2 cups caffeine/day.   Social Determinants of Health   Financial Resource Strain: Not on file  Food Insecurity: Not on file  Transportation Needs: Not on file  Physical Activity: Not on file  Stress: Not on file  Social  Connections: Not on file  Intimate Partner Violence: Not on file    Outpatient Medications Prior to Visit  Medication Sig Dispense Refill  . ALPRAZolam (XANAX) 0.25 MG tablet Take 0.5 tablets (0.125 mg total) by mouth daily as needed for anxiety. 30 tablet 0  . Ascorbic Acid (VITAMIN C) 1000 MG tablet Take 1,000 mg by mouth daily.    Marland Kitchen atorvastatin (LIPITOR) 40 MG tablet TAKE 1 TABLET BY MOUTH AT  BEDTIME 90 tablet 3  . Cholecalciferol (VITAMIN D-3 PO) Take 1 capsule by mouth daily.    . cyanocobalamin (,VITAMIN B-12,) 1000 MCG/ML injection Inject 1 mL (1,000 mcg total) into the muscle every 30 (thirty) days. 3 mL 2  . estradiol (ESTRACE) 0.1 MG/GM vaginal cream Estrogen Cream Instruction  Discard applicator Apply pea sized amount to tip of finger to urethra before bed. Wash hands well after application. Use Monday, Wednesday and Friday 42.5 g 12  . HYCODAN 5-1.5 MG/5ML syrup TAKE 5 MILLILITERS BY MOUTH EVERY 6 HOURS AS NEEDED    . irbesartan (AVAPRO) 150 MG tablet TAKE 1 TABLET BY MOUTH  DAILY 90 tablet 3  . ondansetron (ZOFRAN) 8 MG tablet Take 8 mg by mouth every 8 (eight) hours as needed for nausea or vomiting.    . pantoprazole (PROTONIX) 40 MG tablet Take 1 tablet (40 mg total) by mouth daily before breakfast. 30 tablet 0  . PARoxetine (PAXIL) 20 MG tablet TAKE 3 TABLETS BY MOUTH IN  THE MORNING 270 tablet 3  . polyethylene glycol powder (GLYCOLAX/MIRALAX) 17 GM/SCOOP powder Take 17 g by mouth daily as needed for mild constipation.    . SUMAtriptan (IMITREX) 100 MG tablet Take 1 tablet (100 mg total) by mouth 2 (two) times daily as needed. At least 2 hrs between doses as needed bid (Patient taking differently: Take 100 mg by mouth 2 (two) times daily as needed for migraine or headache (and may repeat once in 2 hours, if no relief).) 10 tablet 3  . valACYclovir (VALTREX) 1000 MG tablet Take two tablets daily x 5 days. Then for future outbreaks, take 2 tablets by mouth every 12 hours for 1 day total 60 tablet 0  . vitamin E 180 MG (400 UNITS) capsule Take 400 Units by mouth daily.    Marland Kitchen gabapentin (NEURONTIN) 300 MG capsule      No facility-administered medications prior to visit.    Allergies  Allergen Reactions  . Latex Rash and Other (See Comments)    Blisters, also    Review of Systems  Constitutional: Negative.   Respiratory: Negative.   Cardiovascular: Negative.   Genitourinary: Positive for frequency, hematuria and urgency. Negative for pelvic pain.  Musculoskeletal: Negative.   Skin: Negative.   Allergic/Immunologic: Negative.   Neurological: Negative.   All other systems reviewed and are negative.      Objective:    Physical Exam Constitutional:       Appearance: Normal appearance.  Eyes:     Extraocular Movements: Extraocular movements intact.     Pupils: Pupils are equal, round, and reactive to light.  Cardiovascular:     Rate and Rhythm: Normal rate and regular rhythm.  Pulmonary:     Effort: Pulmonary effort is normal.     Breath sounds: Normal breath sounds.  Abdominal:     Palpations: Abdomen is soft.  Musculoskeletal:        General: Normal range of motion.     Cervical back: Normal range of motion and neck supple.  Skin:    General: Skin is warm and dry.  Neurological:     General: No focal deficit present.     Mental Status: She is alert and oriented to person, place, and time.  Psychiatric:        Mood and Affect: Mood normal.        Behavior: Behavior normal.     BP (!) 147/70   Pulse 72   Temp 98.7 F (37.1 C) (Temporal)   Ht 5' 3"  (1.6 m)   Wt 163 lb 3.2 oz (74 kg)   SpO2 94%   BMI 28.91 kg/m  Wt Readings from Last 3 Encounters:  02/05/21 163 lb 3.2 oz (74 kg)  01/29/21 165 lb (74.8 kg)  01/17/21 166 lb (75.3 kg)    There are no preventive care reminders to display for this patient.  There are no preventive care reminders to display for this patient.   Lab Results  Component Value Date   TSH 2.120 12/27/2020   Lab Results  Component Value Date   WBC 4.9 08/29/2020   HGB 11.1 (L) 08/29/2020   HCT 33.4 (L) 08/29/2020   MCV 90.3 08/29/2020   PLT 252 08/29/2020   Lab Results  Component Value Date   NA 139 08/29/2020   K 5.1 08/29/2020   CO2 28 08/29/2020   GLUCOSE 103 (H) 08/29/2020   BUN 17 08/29/2020   CREATININE 0.88 08/29/2020   BILITOT 0.4 08/29/2020   ALKPHOS 109 05/08/2020   AST 27 08/29/2020   ALT 24 08/29/2020   PROT 6.8 08/29/2020   ALBUMIN 4.1 05/08/2020   CALCIUM 10.0 08/29/2020   ANIONGAP 9 05/08/2020   GFR 66.15 05/01/2020   Lab Results  Component Value Date   CHOL 181 02/27/2020   Lab Results  Component Value Date   HDL 38.60 (L) 02/27/2020   Lab Results   Component Value Date   LDLCALC 111 (H) 02/27/2020   Lab Results  Component Value Date   TRIG 159.0 (H) 02/27/2020   Lab Results  Component Value Date   CHOLHDL 5 02/27/2020   Lab Results  Component Value Date   HGBA1C 6.1 (H) 11/30/2014       Assessment & Plan:   Problem List Items Addressed This Visit   None   Visit Diagnoses    Frequency of urination    -  Primary   Relevant Medications   sulfamethoxazole-trimethoprim (BACTRIM DS) 800-160 MG tablet   Other Relevant Orders   POCT Urinalysis Dipstick (Completed)   Urine Culture   Urinary urgency       Relevant Medications   sulfamethoxazole-trimethoprim (BACTRIM DS) 800-160 MG tablet       Meds ordered this encounter  Medications  . sulfamethoxazole-trimethoprim (BACTRIM DS) 800-160 MG tablet    Sig: Take 1 tablet by mouth 2 (two) times daily.    Dispense:  14 tablet    Refill:  0   Advised patient to call the office with any questions or concerns.  Follow-up with urologist for the possibility of interstitial cystitis and cystoscopy given the frequency of symptoms.  Urine culture sent will notify patient pending results.  Kennyth Arnold, FNP

## 2021-02-05 NOTE — Patient Instructions (Signed)
Interstitial Cystitis  Interstitial cystitis is inflammation of the bladder. This condition is also known as painful bladder syndrome. This may cause pain in the bladder area as well as a frequent and urgent need to urinate. The bladder is an organ that stores urine after the urine is made in the kidneys. The severity of interstitial cystitis can vary from person to person. You may have flare-ups, and then your symptoms may go away for a while. For many people, it becomes a long-term (chronic) problem. What are the causes? The cause of this condition is not known. What increases the risk? The following factors may make you more likely to develop this condition:  You are female.  You have fibromyalgia.  You have irritable bowel syndrome (IBS).  You have endometriosis. This condition may be aggravated by:  Stress.  Smoking.  Spicy foods. What are the signs or symptoms? Symptoms of interstitial cystitis vary, and they can change over time. Symptoms may include:  Discomfort or pain in the bladder area, which is in the lower abdomen. Pain can range from mild to severe. The pain may change in intensity as the bladder fills with urine or as it empties.  Pain in the pelvic area, between the hip bones.  An urgent need to urinate.  Frequent urination.  Pain during urination.  Pain during sex.  Blood in the urine.  Fatigue. For women, symptoms often get worse during menstruation. How is this diagnosed? This condition is diagnosed based on your symptoms, your medical history, and a physical exam. You may have tests to rule out other conditions, such as:  Urine tests.  Cystoscopy. For this test, a tool similar to a very thin telescope is used to look into your bladder.  Biopsy. This involves taking a sample of tissue from the bladder to be examined under a microscope. How is this treated? There is no cure for this condition, but treatment can help you control your symptoms. Work  closely with your health care provider to find the most effective treatments for you. Treatment options may include:  Medicines to relieve pain and reduce how often you feel the need to urinate. This treatment may include: ? A procedure where a small amount of medicine that eases irritation is put inside your bladder through a catheter (bladder instillation).  Lifestyle changes, such as changing your diet or taking steps to control stress.  Physical therapy. This may include: ? Exercises to help relax the pelvic floor muscles. ? Massage to relax tight muscles (myofascial release).  Learning ways to control when you urinate (bladder training).  Using a device that provides electrical stimulation to your nerves, which can relieve pain (neuromodulation therapy). The device is placed on your back, where it blocks the nerves that cause you to feel pain in your bladder area.  A procedure that stretches your bladder by filling it with air or fluid (hydrodistention).  Surgery. This is rare. It is only done for extreme cases, if other treatments do not help. Follow these instructions at home: Lifestyle  Learn and practice relaxation techniques, such as deep breathing and muscle relaxation.  Get care for your body and mental well-being, such as: ? Cognitive behavioral therapy (CBT). This therapy changes the way you think or act in response to the fatigue. This may help improve how you feel. ? Seeing a mental health therapist to evaluate and treat depression, if necessary.  Work with your health care provider on other ways to manage pain. Acupuncture may  be helpful.  Avoid drinking alcohol.  Do not use any products that contain nicotine or tobacco, such as cigarettes, e-cigarettes, and chewing tobacco. If you need help quitting, ask your health care provider. Eating and drinking  Make dietary changes as recommended by your health care provider. You may need to avoid: ? Spicy foods. ? Foods  that contain a lot of potassium.  Limit your intake of drinks that make you need to urinate. These include: ? Caffeinated drinks like soda, coffee, and tea. ? Alcohol. Bladder training  Use bladder training techniques as directed. Techniques may include: ? Urinating at scheduled times. ? Training yourself to delay urination.  Keep a bladder diary. ? Write down the times that you urinate and any symptoms that you have. This can help you find out which foods, liquids, or activities make your symptoms worse. ? Use your bladder diary to schedule bathroom trips. If you are away from home, plan to be near a bathroom at each of your scheduled times.  Make sure that you urinate just before you leave the house and just before you go to bed.   General instructions  Take over-the-counter and prescription medicines only as told by your health care provider.  You can try a warm or cool compress over your bladder for comfort.  Avoid wearing tight clothing.  Do exercises to relax your pelvic floor muscles as told by your physical therapist.  Keep all follow-up visits as told by your health care provider. This is important. Where to find more information To find more information or a support group near you, visit:  [employer]: urologyhealth.org  Interstitial Cystitis Association: ClassPreviews.com.br Contact a health care provider if you have:  Symptoms that do not get better with treatment.  Pain or discomfort that gets worse.  More frequent urges to urinate.  A fever. Get help right away if:  You have no control over when you urinate. Summary  Interstitial cystitis is inflammation of the bladder.  This condition may cause pain in the bladder area as well as a frequent and urgent need to urinate.  You may have flare-ups of the condition, and then it may go away for a while. For many people, it becomes a long-term (chronic) problem.  There is no cure for interstitial cystitis,  but treatment methods are available to control your symptoms. This information is not intended to replace advice given to you by your health care provider. Make sure you discuss any questions you have with your health care provider. Document Revised: 11/23/2019 Document Reviewed: 08/31/2017 Elsevier Patient Education  2021 [employer].

## 2021-02-07 ENCOUNTER — Other Ambulatory Visit: Payer: Self-pay | Admitting: Physician Assistant

## 2021-02-07 ENCOUNTER — Other Ambulatory Visit: Payer: Self-pay | Admitting: *Deleted

## 2021-02-07 LAB — URINE CULTURE
MICRO NUMBER:: 11787027
SPECIMEN QUALITY:: ADEQUATE

## 2021-02-07 MED ORDER — BUTALBITAL-APAP-CAFFEINE 50-325-40 MG PO TABS: ORAL_TABLET | ORAL | 1 refills | Status: DC

## 2021-02-07 NOTE — Telephone Encounter (Signed)
Pt requesting refill for Fioricet 50-325-40 mg tablets. Last OV 08/2020.

## 2021-02-08 ENCOUNTER — Other Ambulatory Visit: Payer: Self-pay | Admitting: Physician Assistant

## 2021-02-08 MED ORDER — BUTALBITAL-APAP-CAFFEINE 50-325-40 MG PO TABS: ORAL_TABLET | ORAL | 1 refills | Status: DC

## 2021-02-13 ENCOUNTER — Other Ambulatory Visit (HOSPITAL_COMMUNITY)
Admission: RE | Admit: 2021-02-13 | Discharge: 2021-02-13 | Disposition: A | Discharge status: Home / Self Care | Payer: Medicare Other | Source: Ambulatory Visit | Attending: Physician Assistant | Admitting: Physician Assistant

## 2021-02-13 ENCOUNTER — Encounter: Payer: Self-pay | Admitting: Physician Assistant

## 2021-02-13 ENCOUNTER — Other Ambulatory Visit: Payer: Self-pay

## 2021-02-13 ENCOUNTER — Other Ambulatory Visit: Payer: Self-pay | Admitting: Family Medicine

## 2021-02-13 ENCOUNTER — Ambulatory Visit (INDEPENDENT_AMBULATORY_CARE_PROVIDER_SITE_OTHER): Payer: Medicare Other | Admitting: Physician Assistant

## 2021-02-13 VITALS — BP 112/70 | HR 72 | Temp 97.3°F | Ht 63.0 in | Wt 158.4 lb

## 2021-02-13 DIAGNOSIS — R350 Frequency of micturition: Secondary | ICD-10-CM | POA: Diagnosis not present

## 2021-02-13 DIAGNOSIS — R102 Pelvic and perineal pain: Secondary | ICD-10-CM | POA: Diagnosis not present

## 2021-02-13 DIAGNOSIS — D649 Anemia, unspecified: Secondary | ICD-10-CM | POA: Diagnosis not present

## 2021-02-13 DIAGNOSIS — I10 Essential (primary) hypertension: Secondary | ICD-10-CM

## 2021-02-13 LAB — POCT URINALYSIS DIPSTICK
Blood, UA: NEGATIVE
Glucose, UA: NEGATIVE
Leukocytes, UA: NEGATIVE
Nitrite, UA: NEGATIVE
Protein, UA: POSITIVE — AB
Spec Grav, UA: >=1.03 — AB (ref 1.010–1.025)
Urobilinogen, UA: 1 E.U./dL
pH, UA: 5.5 (ref 5.0–8.0)

## 2021-02-13 LAB — CBC WITH DIFFERENTIAL/PLATELET
Basophils Absolute: 0.1 10*3/uL (ref 0.0–0.1)
Basophils Relative: 1.2 % (ref 0.0–3.0)
Eosinophils Absolute: 0.1 10*3/uL (ref 0.0–0.7)
Eosinophils Relative: 1.4 % (ref 0.0–5.0)
HCT: 34.7 % — ABNORMAL LOW (ref 36.0–46.0)
Hemoglobin: 11.6 g/dL — ABNORMAL LOW (ref 12.0–15.0)
Lymphocytes Relative: 11.6 % — ABNORMAL LOW (ref 12.0–46.0)
Lymphs Abs: 0.6 10*3/uL — ABNORMAL LOW (ref 0.7–4.0)
MCHC: 33.6 g/dL (ref 30.0–36.0)
MCV: 91.8 fl (ref 78.0–100.0)
Monocytes Absolute: 0.7 10*3/uL (ref 0.1–1.0)
Monocytes Relative: 12.8 % — ABNORMAL HIGH (ref 3.0–12.0)
Neutro Abs: 3.9 10*3/uL (ref 1.4–7.7)
Neutrophils Relative %: 73 % (ref 43.0–77.0)
Platelets: 237 10*3/uL (ref 150.0–400.0)
RBC: 3.78 Mil/uL — ABNORMAL LOW (ref 3.87–5.11)
RDW: 22.6 % — ABNORMAL HIGH (ref 11.5–15.5)
WBC: 5.4 10*3/uL (ref 4.0–10.5)

## 2021-02-13 NOTE — Patient Instructions (Signed)
It was great to see you!  Start colace 100 mg twice daily. Use miralax 1 capful daily.  I will be in touch with your urine culture results if we need to repeat another antibiotic. I will also be in touch with your blood work results and vaginal swab results.  Please go ahead and reach out to Dr. Diamantina Providence about your ongoing symptoms. I think it would be best to see him sooner than your 6 month follow-up.  Take care,  [name] PA-C

## 2021-02-13 NOTE — Progress Notes (Signed)
Autumn Conley is a 75 y.o. female is here for follow up.  I acted as a Education administrator for [employer], PA-C Anselmo Pickler, LPN   History of Present Illness:   Chief Complaint  Patient presents with  . Urinary Frequency    HPI  Urinary frequency/pelvic pressure/constipation Pt c/o urinary frequency, nocturia and pelvic pressure.   Per chart review she was given levaquin on 01/03/21 at [facility], went to [place] 01/09/21 -- was told to take cranberry pills and start estradiol cream, which she has been doing since she saw him.  She came to our office on 02/05/21 for UTI symptoms -- was given bactrim and tolerated well, finished this yesterday. Pt symptoms have never gone away  Denies vaginal discharge or bleeding, diarrhea. Changes incontinence pads as soon as she has a leakage.   She is very constipated at baseline. Uses miralax almost daily, drinking a lot of apple cider. Has pellets of stools even with this regimen. Does a fleet enema weekly, which still provides only limited results.  CT scan reviewed from 10/29/19 and 05/02/20. Both show a ovarian cyst, was 2 cm in July 2021.   Anemia Has had two iron transfusions since last seeing her oncologist. She says that she has two more left until she sees him again. She is interested in having her hgb rechecked to see how she is doing. She has long hx of symptomatic anemia. Last Hgb was 9.5 on 01/08/21.  There are no preventive care reminders to display for this patient.  Past Medical History:  Diagnosis Date  . Anemia   . Anxiety   . Breast cancer ([place])   . Depression   . Dyspnea   . Fibromyalgia    "some; not chronic" (11/29/2014)  . GERD (gastroesophageal reflux disease)   . Glaucoma of both eyes   . Headache   . Hypertension   . IBS (irritable bowel syndrome)   . Mitral valve prolapse   . NASH (nonalcoholic steatohepatitis)   . Osteoarthritis   . PONV (postoperative nausea and vomiting)   . Stroke  ([place])   . 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[facility])      Social History   Tobacco Use  . Smoking status: Never Smoker  . Smokeless tobacco: Never Used  Vaping Use  . Vaping Use: Never used  Substance Use Topics  . Alcohol use: No  . Drug use: No    Past Surgical History:  Procedure Laterality Date  . ABDOMINAL HYSTERECTOMY  1980  . APPENDECTOMY  1953  . BIOPSY  05/03/2020   Procedure: BIOPSY;  Surgeon: Gatha Mayer, MD;  Location: [place];  Service: Endoscopy;;  . BREAST BIOPSY Left   . BREAST LUMPECTOMY Left   . CATARACT EXTRACTION, BILATERAL  2018  . COLONOSCOPY WITH PROPOFOL N/A 05/03/2020   Procedure: COLONOSCOPY WITH PROPOFOL;  Surgeon: Gatha Mayer, MD;  Location[facility] ENDOSCOPY;  Service: Endoscopy;  Laterality: N/A;  . ESOPHAGOGASTRODUODENOSCOPY (EGD) WITH PROPOFOL N/A 05/03/2020   Procedure: ESOPHAGOGASTRODUODENOSCOPY (EGD) WITH PROPOFOL;  Surgeon: Gatha Mayer, MD;  Location: [place];  Service: Endoscopy;  Laterality: N/A;  . MASTECTOMY Left ~ 2009  . PARASTERNAL EXPLORATION Right 02/14/2019   Procedure: PARASTERNAL MEDIAL EXPLORATION WITH BIOPIES.;  Surgeon: [name], MD;  Location: [place];  Service: Thoracic;  Laterality: Right;  . POLYPECTOMY  05/03/2020   Procedure: POLYPECTOMY;  Surgeon: Gatha Mayer, MD;  Location[facility] ENDOSCOPY;  Service: Endoscopy;;    Family History  Problem Relation Age of Onset  . Lung cancer Mother 15  . Prostate cancer Brother 62  . Bladder Cancer Neg Hx   . Kidney cancer Neg Hx     PMHx, SurgHx, SocialHx, FamHx, Medications, and Allergies were reviewed in the Visit Navigator and updated as appropriate.   Patient Active Problem List   Diagnosis Date Noted  . Recurrent UTI 06/28/2020  . Inflammatory arthritis 05/16/2020  . Erosive gastritis   . Benign neoplasm of ascending colon   . Heme + stool   . Change in bowel habit   . Anemia 05/01/2020  . Dyspnea 05/01/2020  . Generalized weakness 05/01/2020  . UTI (urinary tract  infection) 05/01/2020  . Symptomatic anemia 05/01/2020  . Glaucoma of both eyes   . NASH (nonalcoholic steatohepatitis)   . Mitral valve prolapse   . Thymoma -- managed by [facility] - extensive documentation in Care Everywhere 03/22/2019  . Breast cancer ([place]) 03/18/2019  . Mass of upper lobe of right lung 01/20/2019  . B12 deficiency 12/19/2018  . Stress incontinence in female 12/19/2018  . Dyslipidemia 12/14/2018  . OSA (obstructive sleep apnea) 12/07/2018  . History of breast cancer 12/07/2018  . Cognitive complaints 11/25/2018  . False positive stress test 01/29/2015  . Migraine without aura and without status migrainosus, not intractable 12/18/2014  . Small vessel disease, cerebrovascular 12/18/2014  . GERD (gastroesophageal reflux disease) 12/02/2014  . HTN (hypertension) 12/27/2013  . Anxiety 12/27/2013  . Depression 11/03/2012  . Elevated LFTs 11/03/2012  . Fibromyalgia 11/03/2012  . IBS (irritable bowel syndrome) 11/03/2012  . Lymphocytic colitis 11/03/2012  . Paget's disease of nipple ([place]) 11/03/2012  . Vitamin D deficiency 11/03/2012  . Osteoarthritis 11/03/2012    Social History   Tobacco Use  . Smoking status: Never Smoker  . Smokeless tobacco: Never Used  Vaping Use  . Vaping Use: Never used  Substance Use Topics  . Alcohol use: No  . Drug use: No    Current Medications and Allergies:    Current Outpatient Medications:  .  ALPRAZolam (XANAX) 0.25 MG tablet, Take 0.5 tablets (0.125 mg total) by mouth daily as needed for anxiety., Disp: 30 tablet, Rfl: 0 .  Ascorbic Acid (VITAMIN C) 1000 MG tablet, Take 1,000 mg by mouth daily., Disp: , Rfl:  .  atorvastatin (LIPITOR) 40 MG tablet, TAKE 1 TABLET BY MOUTH AT  BEDTIME, Disp: 90 tablet, Rfl: 3 .  butalbital-acetaminophen-caffeine (FIORICET) 50-325-40 MG tablet, TAKE 1 TO 2 TABLETS BY  MOUTH EVERY 6 HOURS AS  NEEDED FOR HEADACHE(S) .  MANUFACTURER RECOMMENDS NOT EXCEEDING 6 TABLETS/DAY, Disp: 20 tablet, Rfl: 1 .   Cholecalciferol (VITAMIN D-3 PO), Take 1 capsule by mouth daily., Disp: , Rfl:  .  cyanocobalamin (,VITAMIN B-12,) 1000 MCG/ML injection, Inject 1 mL (1,000 mcg total) into the muscle every 30 (thirty) days., Disp: 3 mL, Rfl: 2 .  estradiol (ESTRACE) 0.1 MG/GM vaginal cream, Estrogen Cream Instruction Discard applicator Apply pea sized amount to tip of finger to urethra before bed. Wash hands well after application. Use Monday, Wednesday and Friday, Disp: 42.5 g, Rfl: 12 .  irbesartan (AVAPRO) 150 MG tablet, TAKE 1 TABLET BY MOUTH  DAILY, Disp: 90 tablet, Rfl: 3 .  ondansetron (ZOFRAN) 8 MG tablet, Take 8 mg by mouth every 8 (eight) hours as needed for nausea or vomiting., Disp: , Rfl:  .  pantoprazole (PROTONIX) 40 MG tablet, Take 1 tablet (40 mg total) by mouth daily before breakfast., Disp: 30 tablet,  Rfl: 0 .  PARoxetine (PAXIL) 20 MG tablet, TAKE 3 TABLETS BY MOUTH IN  THE MORNING, Disp: 270 tablet, Rfl: 3 .  polyethylene glycol powder (GLYCOLAX/MIRALAX) 17 GM/SCOOP powder, Take 17 g by mouth daily as needed for mild constipation., Disp: , Rfl:  .  SUMAtriptan (IMITREX) 100 MG tablet, Take 1 tablet (100 mg total) by mouth 2 (two) times daily as needed. At least 2 hrs between doses as needed bid (Patient taking differently: Take 100 mg by mouth 2 (two) times daily as needed for migraine or headache (and may repeat once in 2 hours, if no relief).), Disp: 10 tablet, Rfl: 3 .  valACYclovir (VALTREX) 1000 MG tablet, Take two tablets daily x 5 days. Then for future outbreaks, take 2 tablets by mouth every 12 hours for 1 day total, Disp: 60 tablet, Rfl: 0 .  vitamin E 180 MG (400 UNITS) capsule, Take 400 Units by mouth daily., Disp: , Rfl:    Allergies  Allergen Reactions  . Latex Rash and Other (See Comments)    Blisters, also    Review of Systems   ROS Negative unless otherwise specified per HPI.  Vitals:   Vitals:   02/13/21 0745  BP: 112/70  Pulse: 72  Temp: (!) 97.3 F (36.3 C)   TempSrc: Temporal  SpO2: 97%  Weight: 158 lb 6.1 oz (71.8 kg)  Height: 5' 3"  (1.6 m)     Body mass index is 28.06 kg/m.   Physical Exam:    Physical Exam Vitals and nursing note reviewed. Exam conducted with a chaperone present Butch Penny, LPN).  Constitutional:      General: She is not in acute distress.    Appearance: She is well-developed. She is not ill-appearing or toxic-appearing.  Cardiovascular:     Rate and Rhythm: Normal rate and regular rhythm.     Pulses: Normal pulses.     Heart sounds: Normal heart sounds, S1 normal and S2 normal.     Comments: No LE edema Pulmonary:     Effort: Pulmonary effort is normal.     Breath sounds: Normal breath sounds.  Genitourinary:    Vagina: Normal.     Cervix: Normal.  Skin:    General: Skin is warm and dry.  Neurological:     Mental Status: She is alert.     GCS: GCS eye subscore is 4. GCS verbal subscore is 5. GCS motor subscore is 6.  Psychiatric:        Speech: Speech normal.        Behavior: Behavior normal. Behavior is cooperative.    Results for orders placed or performed in visit on 02/13/21  POCT urinalysis dipstick  Result Value Ref Range   Color, UA dark yellow    Clarity, UA cloudy    Glucose, UA Negative Negative   Bilirubin, UA 1+    Ketones, UA 1+    Spec Grav, UA >=1.030 (A) 1.010 - 1.025   Blood, UA negative    pH, UA 5.5 5.0 - 8.0   Protein, UA Positive (A) Negative   Urobilinogen, UA 1.0 0.2 or 1.0 E.U./dL   Nitrite, UA negative    Leukocytes, UA Negative Negative   Appearance     Odor        Assessment and Plan:    Cadince was seen today for urinary frequency.  Diagnoses and all orders for this visit:  Urinary frequency Ongoing. UA ok today but does show dehydration. Will order urine culture given her  ongoing recurrent UTIs. Discussed needing to work on better bowel management to see if improvement of constipation can help symptoms -- recommend colace 100 mg BID and miralax 1 capful  daily. Continue to push fluids. I also recommended that she go ahead and follow-up with her urologist given ongoing symptoms even though she is not due for follow-up until Sep. -     POCT urinalysis dipstick -     Urine Culture -     Cervicovaginal ancillary only( [place])  Symptomatic anemia Update CBC per patient request. I did discuss that her CBC will not be completely reflective of her recent iron transfusions. She verbalized understanding and is in agreement. -     CBC with Differential/Platelet  Pelvic pressure in female I performed pelvic exam today to assess for yeast or BV in light of recurrent UTI symptoms. I did suggest that if she does not feel that the estradiol cream is helping to stop this. Per chart review, she does have history of ovarian cyst, so I would like to consider imaging if symptoms do not improve with constipation improvement. -     Cervicovaginal ancillary only( [place])  CMA or LPN served as scribe during this visit. History, Physical, and Plan performed by medical provider. The above documentation has been reviewed and is accurate and complete.  [name], PA-C [place], [place] 02/13/2021  Follow-up: No follow-ups on file.

## 2021-02-14 LAB — CERVICOVAGINAL ANCILLARY ONLY
Bacterial Vaginitis (gardnerella): NEGATIVE
Candida Vaginitis: NEGATIVE
Comment: NEGATIVE
Comment: NEGATIVE
Comment: NEGATIVE
Trichomonas: NEGATIVE

## 2021-02-14 LAB — URINE CULTURE
MICRO NUMBER:: 11820723
SPECIMEN QUALITY:: ADEQUATE

## 2021-02-15 ENCOUNTER — Other Ambulatory Visit: Payer: Self-pay | Admitting: *Deleted

## 2021-02-15 DIAGNOSIS — R102 Pelvic and perineal pain: Secondary | ICD-10-CM

## 2021-02-18 ENCOUNTER — Telehealth: Payer: Self-pay

## 2021-02-18 NOTE — Chronic Care Management (AMB) (Signed)
Chronic Care Management Pharmacy Assistant   Name: Autumn Conley  MRN: [id number] DOB: [birth year]  Autumn Conley is an 75 y.o. year old female who presents for his initial CCM visit with the clinical pharmacist.  Reason for Encounter: Chart Prep    Recent office visits:  02/13/21- Autumn Coke, PA- seen for urinary frequency, started colace 100 mg twice daily ,Use miralax 1 capful daily,labs done,  recommended follow up with urologist 02/05/21- Autumn Conley, Autumn Conley B, FNP-seen for urinary frequency, labs ordered, short course bactrim, follow up with urologist  08/29/20- Autumn Coke, PA- seen for anemia follow up, labs ordered, follow up 6 months   Recent consult visits:  01/29/21- [name], MD (Cardiology)-seen for palpitations, scheduled echocardiogram, follow up after echocardiogram 01/17/21- Autumn Coder, MD( Pulmonology)- SOB follow up, recommended graded exercises as tolerated, follow up 4-6 weeks for breathing study 01/09/21- Autumn Madrid, MD (Urology)- seen for recurrent UTIs + stress incontinence, Start cranberry tablets, started estradiol 0.1 mg/gm, encouraged Kegel exercises for stress incontinence,follow up  6 months symptom check 01/08/21-Autumn Sarita Haver, MD( Hematology)  IV Iron 01/03/21- Autumn Mew, PA [facility])- seen for UTI sxs, labs performed, short course cefdinir 300 mg x7 DS, referral to urology  01/01/21-Autumn Sarita Haver, MD( Hematology)  IV Iron 12/26/20- Autumn Munroe, MD ( Cardio)- scheduled echocardiogram, referral to pulmonology, follow up 6 weeks 12/25/20- Autumn Sark, MD (Hematology)- seen for thymoma, imaging ordered, plan to give IV iron, ordered prescriptions benzonatate and pantoprazole, follow as scheduled 02/10/22[name], MD ( Cardio)- seen for palpitations, started metoprolol 12.46m BID, ordered 14- Telemetry monitor *Patient message*,ordered echocardiogram,  follow up  4-6 weeks 10/08/20- Autumn Server MD( Pain Medicine)- initial consult, referral to pain management 09/25/20- Autumn GBarbra Sarks PA-C(Hematology)- short course prednisone 20 mg  x5 DS, short course levofloxacin 500 mg x7DS, referral to pain clinic, follow up 3 months  09/10/20-  [name] MD ( Cardio)- seen for Presyncope and palpitations, follow up 3 months   Hospital visits:  None in previous 6 months  Medications: Outpatient Encounter Medications as of 02/18/2021  Medication Sig  . ALPRAZolam (XANAX) 0.25 MG tablet Take 0.5 tablets (0.125 mg total) by mouth daily as needed for anxiety.  . Ascorbic Acid (VITAMIN C) 1000 MG tablet Take 1,000 mg by mouth daily.  .Marland Kitchenatorvastatin (LIPITOR) 40 MG tablet TAKE 1 TABLET BY MOUTH AT  BEDTIME  . butalbital-acetaminophen-caffeine (FIORICET) 50-325-40 MG tablet TAKE 1 TO 2 TABLETS BY  MOUTH EVERY 6 HOURS AS  NEEDED FOR HEADACHE(S) .  MANUFACTURER RECOMMENDS NOT EXCEEDING 6 TABLETS/DAY  . Cholecalciferol (VITAMIN D-3 PO) Take 1 capsule by mouth daily.  . cyanocobalamin (,VITAMIN B-12,) 1000 MCG/ML injection Inject 1 mL (1,000 mcg total) into the muscle every 30 (thirty) days.  .Marland Kitchenestradiol (ESTRACE) 0.1 MG/GM vaginal cream Estrogen Cream Instruction Discard applicator Apply pea sized amount to tip of finger to urethra before bed. Wash hands well after application. Use Monday, Wednesday and Friday  . irbesartan (AVAPRO) 150 MG tablet TAKE 1 TABLET BY MOUTH  DAILY  . ondansetron (ZOFRAN) 8 MG tablet Take 8 mg by mouth every 8 (eight) hours as needed for nausea or vomiting.  . pantoprazole (PROTONIX) 40 MG tablet Take 1 tablet (40 mg total) by mouth daily before breakfast.  . PARoxetine (PAXIL) 20 MG tablet TAKE 3 TABLETS BY MOUTH IN  THE MORNING  . polyethylene glycol powder (GLYCOLAX/MIRALAX) 17 GM/SCOOP powder Take 17 g by  mouth daily as needed for mild constipation.  . SUMAtriptan (IMITREX) 100 MG tablet Take 1 tablet (100 mg total) by mouth 2 (two) times daily as  needed. At least 2 hrs between doses as needed bid (Patient taking differently: Take 100 mg by mouth 2 (two) times daily as needed for migraine or headache (and may repeat once in 2 hours, if no relief).)  . valACYclovir (VALTREX) 1000 MG tablet Take two tablets daily x 5 days. Then for future outbreaks, take 2 tablets by mouth every 12 hours for 1 day total  . vitamin E 180 MG (400 UNITS) capsule Take 400 Units by mouth daily.   No facility-administered encounter medications on file as of 02/18/2021.   Current Documented Medications  Alprazolam 0.25 mg- 60 DS last filled 02/27/20 Vitamin C 1000 mg- Atorvastatin 40 mg- 90 Ds last filled 01/17/21 Fioricet 50-325-40 mg- 4 DS last filled 02/08/21 Vitamin D-3 - Vitamin B-12 1000 mcg/ml Inj  Estradiol 0.1 mg/gm- 30 DS last filled 01/09/21 Irbesartan 150 mg- 90 DS last filled 12/13/20 Ondansetron 8 mg- 10 DS last filled 11/06/19- Patient reported no taking  Pantoprazole 40 mg- 90 DS 12/25/20 Paroxetine 20 mg- 90 DS last filled 12/19/20 polyethylene glycol powder (GLYCOLAX/MIRALAX) 17 GM/SCOOP powder Sumatriptan 100 mg- 30 DS last filled 08/15/20 Valacyclovir 1000 mg- 15 DS last filled 07/02/20 Vitamin E 180 mg   Have you seen any other providers since your last visit? Patient has not seen any providers since last PCP visit.  Any changes in your medications or health?  Patient stated there are  no changes in medications   Any side effects from any medications? Patient stated she currently has no side effects  Do you have an symptoms or problems not managed by your medications?  Patient stated no unmanaged symptoms  Any concerns about your health right now? Patient did not have any concerns for her health  Has your provider asked that you check blood pressure, blood sugar, or follow special diet at home? Patient stated she was asked to use her arm cuff BP monitor to check her readings. She does so regularly and stated her readings have  been constantly within normal limits. She was also asked to follow a diet consisting of low fat and high iron foods.   Do you get any type of exercise on a regular basis?  Patient stated she does not get much activity due to having lack of strength   Can you think of a goal you would like to reach for your health? Ms. Slaby stated she would like to be able to get back her strength to be able to do things without limitations  Do you have any problems getting your medications? Patient stated she has no problems getting medications. She receives her medications through [facility] and occasionally uses [facility].   Is there anything that you would like to discuss during the appointment?  Patient did not have anything to discuss  Please bring medications and supplements to appointment Reminded patient of initial phone visit with CPP on 05/04 at 11 am   [place], CMA

## 2021-02-19 ENCOUNTER — Other Ambulatory Visit (HOSPITAL_COMMUNITY)
Admission: RE | Admit: 2021-02-19 | Discharge: 2021-02-19 | Disposition: A | Discharge status: Home / Self Care | Payer: Medicare Other | Source: Ambulatory Visit | Attending: Pulmonary Disease | Admitting: Pulmonary Disease

## 2021-02-19 ENCOUNTER — Telehealth: Payer: Medicare Other

## 2021-02-19 DIAGNOSIS — Z01812 Encounter for preprocedural laboratory examination: Secondary | ICD-10-CM | POA: Insufficient documentation

## 2021-02-19 DIAGNOSIS — Z20822 Contact with and (suspected) exposure to covid-19: Secondary | ICD-10-CM | POA: Insufficient documentation

## 2021-02-19 LAB — SARS CORONAVIRUS 2 (TAT 6-24 HRS): SARS Coronavirus 2: NEGATIVE

## 2021-02-20 ENCOUNTER — Ambulatory Visit (INDEPENDENT_AMBULATORY_CARE_PROVIDER_SITE_OTHER): Payer: Medicare Other

## 2021-02-20 DIAGNOSIS — I10 Essential (primary) hypertension: Secondary | ICD-10-CM

## 2021-02-20 DIAGNOSIS — K219 Gastro-esophageal reflux disease without esophagitis: Secondary | ICD-10-CM

## 2021-02-20 NOTE — Patient Instructions (Addendum)
Autumn Conley,  Thank you for talking with me today. I have included our care plan/goals in the following pages.   Please review and call me at [phone] with any questions.  Thanks! Ellin Mayhew, PharmD, CPP Clinical Pharmacist Practitioner  [facility] [phone]  Patient Care Plan: CCM Pharmacy Care Plan    Problem Identified: HLD HTN OSA GERD MDD Recurrent UTI Stress incontinence   Priority: High    Long-Range Goal: Disease Management   Start Date: 02/21/2021  Expected End Date: 02/21/2022  This Visit's Progress: On track  Priority: High  Note:    Current Barriers:  . Could work towards LDL goal < 100 - strength/exercise tolerance now improving   Pharmacist Clinical Goal(s):  Marland Kitchen Patient will contact provider office for questions/concerns as evidenced notation of same in electronic health record through collaboration with PharmD and provider.   Interventions: . 1:1 collaboration with [name], PA regarding development and update of comprehensive plan of care as evidenced by provider attestation and co-signature . Inter-disciplinary care team collaboration (see longitudinal plan of care) . Comprehensive medication review performed; medication list updated in electronic medical record . No changes to medications   Hypertension (BP goal <130/80) -Controlled, consistently at goal at OVs -Current treatment: . Irbesartan 150 mg once daily  -No home monitoring, has home cuff if needed  -Current dietary habits: low fat diet. Current exercise habits: starting to increase exercise frequency - hgb had dropped to 6.6 04/2020, improved with iron infxns to 11.6 02/13/2021. Feeling much better now.  -Denies hypotensive/hypertensive symptoms -Counseled to monitor BP at home as directed, document, and provide log at future appointments -Recommended to continue current medication  Hyperlipidemia: (LDL goal < 100) -Not ideally controlled, last  LDL 111, consistently close to goal (+/-) -10 yr ASCVD 16%, primary prevention  -Current treatment: . Atorvastatin 40 mg once daily  -Denies tolerability issues or concerns -Educated on Cholesterol goals; patient to start walking more as tolerated -Recommended to continue current medication  GERD/errosive gastritis (Goal to minimize symptoms) -Controlled, no issues or need for trigger avoidance -Receives B12 injections at home - daughter provides them -Avoids NSAIDs, occasional use of APAP for OA pain.  -Current regimen  Pantoprazole 40 mg once daily -Continue current management  Depression (Goal: minimize symptoms) -Controlled -Current treatment: . Paroxetine 60 mg once daily -PHQ9: 0. Denies any worsening symptoms or side effects. Feels content with management -Educated on Benefits of medication for symptom control -Recommended to continue current medication  Osteopenia (Goal maintain bone density, ensure optimization of supplements) -Controlled -Last DEXA Scan: 02/2020 - osteopenia w/ plan to repeat in 2 yrs. FRAX score: 10 year major osteoporotic risk: 7.6%. 10 year hip fracture risk: 0.7%. -Long term SSRI and PPI use -Patient is not a candidate for pharmacologic treatment -Current treatment  . Vitamin D3 1000 units daily  -Recommend weight-bearing and muscle strengthening exercises for building and maintaining bone density. -Recommended to continue current medication  Patient Goals/Self-Care Activities . Patient will:  - take medications as prescribed target a minimum of 150 minutes of moderate intensity exercise weekly  Follow Up Plan: [place] f/u telephone visit 6-8 months Medication Assistance: None required.  Patient affirms current coverage meets needs.  Patient's preferred pharmacy is:  [id number] [id number]: [phone]: [address]  [place], [place] [address] Phone: [phone] Fax: [phone]  Follow Up:  Patient agrees to Care Plan and Follow-up.  Plan: rph f/u telephone call 6 months       The patient was given the following information about Chronic Care Management services today, agreed to services, and gave verbal consent: 1. CCM service includes personalized support from designated clinical staff supervised by the primary care provider, including individualized plan of care and coordination with other care providers 2. 24/7 contact phone numbers for assistance for urgent and routine care needs. 3. Service will only be billed when office clinical staff spend 20 minutes or more in a month to coordinate care. 4. Only one practitioner may furnish and bill the service in a calendar month. 5.The patient may stop CCM services at any time (effective at the end of the month) by phone call to the office staff. 6. The patient will be responsible for cost sharing (co-pay) of up to 20% of the service fee (after annual deductible is met). Patient agreed to services and consent obtained.  The patient verbalized understanding of instructions provided today and agreed to receive a MyChart copy of patient instruction and/or educational materials. Telephone follow up appointment with pharmacy team member scheduled for: See next appointment with "Care Management Staff" under "What's Next" below.   High Cholesterol  High cholesterol is a condition in which the blood has high levels of a white, waxy substance similar to fat (cholesterol). The liver makes all the cholesterol that the body needs. The human body needs small amounts of cholesterol to help build cells. A person gets extra or excess cholesterol from the food that he or she eats. The blood carries cholesterol from the liver to the rest of the body. If you have high cholesterol, deposits (plaques) may build up on the walls of  your arteries. Arteries are the blood vessels that carry blood away from your heart. These plaques make the arteries narrow and stiff. Cholesterol plaques increase your risk for heart attack and stroke. Work with your health care provider to keep your cholesterol levels in a healthy range. What increases the risk? The following factors may make you more likely to develop this condition:  Eating foods that are high in animal fat (saturated fat) or cholesterol.  Being overweight.  Not getting enough exercise.  A family history of high cholesterol (familial hypercholesterolemia).  Use of tobacco products.  Having diabetes. What are the signs or symptoms? There are no symptoms of this condition. How is this diagnosed? This condition may be diagnosed based on the results of a blood test.  If you are older than 76 years of age, your health care provider may check your cholesterol levels every 4-6 years.  You may be checked more often if you have high cholesterol or other risk factors for heart disease. The blood test for cholesterol measures:  "Bad" cholesterol, or LDL cholesterol. This is the main type of cholesterol that causes heart disease. The desired level is less than 100 mg/dL.  "Good" cholesterol, or HDL cholesterol. HDL helps protect against heart disease by cleaning the arteries and carrying the LDL to the liver for processing. The desired level for HDL is 60 mg/dL or higher.  Triglycerides. These are fats that your body can store or burn for energy. The desired level is less than 150 mg/dL.  Total cholesterol. This measures the total amount of cholesterol in your blood and includes LDL, HDL, and triglycerides. The  desired level is less than 200 mg/dL. How is this treated? This condition may be treated with:  Diet changes. You may be asked to eat foods that have more fiber and less saturated fats or added sugar.  Lifestyle changes. These may include regular exercise,  maintaining a healthy weight, and quitting use of tobacco products.  Medicines. These are given when diet and lifestyle changes have not worked. You may be prescribed a statin medicine to help lower your cholesterol levels. Follow these instructions at home: Eating and drinking  Eat a healthy, balanced diet. This diet includes: ? Daily servings of a variety of fresh, frozen, or canned fruits and vegetables. ? Daily servings of whole grain foods that are rich in fiber. ? Foods that are low in saturated fats and trans fats. These include poultry and fish without skin, lean cuts of meat, and low-fat dairy products. ? A variety of fish, especially oily fish that contain omega-3 fatty acids. Aim to eat fish at least 2 times a week.  Avoid foods and drinks that have added sugar.  Use healthy cooking methods, such as roasting, grilling, broiling, baking, poaching, steaming, and stir-frying. Do not fry your food except for stir-frying.   Lifestyle  Get regular exercise. Aim to exercise for a total of 150 minutes a week. Increase your activity level by doing activities such as gardening, walking, and taking the stairs.  Do not use any products that contain nicotine or tobacco, such as cigarettes, e-cigarettes, and chewing tobacco. If you need help quitting, ask your health care provider.   General instructions  Take over-the-counter and prescription medicines only as told by your health care provider.  Keep all follow-up visits as told by your health care provider. This is important. Where to find more information  American Heart Association: www.heart.org  National Heart, Lung, and Blood Institute: https://wilson-eaton.com/ Contact a health care provider if:  You have trouble achieving or maintaining a healthy diet or weight.  You are starting an exercise program.  You are unable to stop smoking. Get help right away if:  You have chest pain.  You have trouble breathing.  You have any  symptoms of a stroke. "BE FAST" is an easy way to remember the main warning signs of a stroke: ? B - Balance. Signs are dizziness, sudden trouble walking, or loss of balance. ? E - Eyes. Signs are trouble seeing or a sudden change in vision. ? F - Face. Signs are sudden weakness or numbness of the face, or the face or eyelid drooping on one side. ? A - Arms. Signs are weakness or numbness in an arm. This happens suddenly and usually on one side of the body. ? S - Speech. Signs are sudden trouble speaking, slurred speech, or trouble understanding what people say. ? T - Time. Time to call emergency services. Write down what time symptoms started.  You have other signs of a stroke, such as: ? A sudden, severe headache with no known cause. ? Nausea or vomiting. ? Seizure. These symptoms may represent a serious problem that is an emergency. Do not wait to see if the symptoms will go away. Get medical help right away. Call your local emergency services (911 in the [place] Do not drive yourself to the hospital. Summary  Cholesterol plaques increase your risk for heart attack and stroke. Work with your health care provider to keep your cholesterol levels in a healthy range.  Eat a healthy, balanced diet, get regular exercise, and  maintain a healthy weight.  Do not use any products that contain nicotine or tobacco, such as cigarettes, e-cigarettes, and chewing tobacco.  Get help right away if you have any symptoms of a stroke. This information is not intended to replace advice given to you by your health care provider. Make sure you discuss any questions you have with your health care provider. Document Revised: 09/05/2019 Document Reviewed: 09/05/2019 Elsevier Patient Education  2021 [employer].

## 2021-02-20 NOTE — Progress Notes (Signed)
Chronic Care Management Pharmacy Note  02/21/2021 Name:  Autumn Conley MRN:  [id number] DOB:  1945-12-13  Subjective: Autumn Conley is an 75 y.o. year old female who is a primary patient of [name], [place].  The CCM team was consulted for assistance with disease management and care coordination needs.    Engaged with patient by telephone for initial visit in response to provider referral for pharmacy case management and/or care coordination services.   Consent to Services:  The patient was given the following information about Chronic Care Management services today, agreed to services, and gave verbal consent: 1. CCM service includes personalized support from designated clinical staff supervised by the primary care provider, including individualized plan of care and coordination with other care providers 2. 24/7 contact phone numbers for assistance for urgent and routine care needs. 3. Service will only be billed when office clinical staff spend 20 minutes or more in a month to coordinate care. 4. Only one practitioner may furnish and bill the service in a calendar month. 5.The patient may stop CCM services at any time (effective at the end of the month) by phone call to the office staff. 6. The patient will be responsible for cost sharing (co-pay) of up to 20% of the service fee (after annual deductible is met). Patient agreed to services and consent obtained.  Patient Care Team: [name], [place] as PCP - General (Physician Assistant) Bo Merino, MD as Consulting Physician (Rheumatology) Monna Fam, MD as Consulting Physician (Ophthalmology) Amil Amen, MD as Referring Physician (Psychiatry) Marcial Pacas, MD as Consulting Physician (Neurology) [name], MD (Inactive) as Consulting Physician (Cardiothoracic Surgery) Brantley Fling, MD as Consulting Physician (Oncology) Curt Bears, MD as Consulting Physician (Oncology) Madelin Rear, [facility] as Pharmacist  (Pharmacist)  Recent office visits:  02/13/21- [name], PA- seen for urinary frequency, started colace 100 mg twice daily ,Use miralax 1 capful daily,labs done,  recommended follow up with urologist 02/05/21- [name], [name] B, FNP-seen for urinary frequency, labs ordered, short course bactrim, follow up with urologist  08/29/20- [name], PA- seen for anemia follow up, labs ordered, follow up 6 months   Recent consult visits:  01/29/21- [name], MD (Cardiology)-seen for palpitations, scheduled echocardiogram, follow up after echocardiogram 01/17/21- [name], MD( Pulmonology)- SOB follow up, recommended graded exercises as tolerated, follow up 4-6 weeks for breathing study 01/09/21- [name], MD (Urology)- seen for recurrent UTIs + stress incontinence, Start cranberry tablets, started estradiol 0.1 mg/gm, encouraged Kegel exercises for stress incontinence,follow up  6 months symptom check 01/08/21-[name], MD( Hematology)  IV Iron 01/03/21- [name], [facility])- seen for UTI sxs, labs performed, short course cefdinir 300 mg x7 DS, referral to urology  01/01/21-[name], MD( Hematology)  IV Iron 12/26/20- [name], MD ( Cardio)- scheduled echocardiogram, referral to pulmonology, follow up 6 weeks 12/25/20- [name], MD (Hematology)- seen for thymoma, imaging ordered, plan to give IV iron, ordered prescriptions benzonatate and pantoprazole, follow as scheduled 11/29/20- [name], MD ( Cardio)- seen for palpitations, started metoprolol 12.26m BID, ordered 14- Telemetry monitor *Patient message*,ordered echocardiogram,  follow up  4-6 weeks 10/08/20- [name] MD( Pain Medicine)- initial consult, referral to pain management 09/25/20- [name] PA-C(Hematology)- short course prednisone 20 mg  x5 DS, short course levofloxacin 500 mg x7DS, referral to pain clinic, follow  up 3 months  09/10/20-  [name] MD ( Cardio)- seen for Presyncope  and palpitations, follow up 3 months   Objective:  Lab Results  Component Value Date   CREATININE 0.88 08/29/2020   CREATININE 0.89 07/02/2020   CREATININE 0.90 05/08/2020    Lab Results  Component Value Date   HGBA1C 6.1 (H) 11/30/2014   Last diabetic Eye exam: No results found for: HMDIABEYEEXA  Last diabetic Foot exam: No results found for: HMDIABFOOTEX      Component Value Date/Time   CHOL 181 02/27/2020 0932   TRIG 159.0 (H) 02/27/2020 0932   HDL 38.60 (L) 02/27/2020 0932   CHOLHDL 5 02/27/2020 0932   VLDL 31.8 02/27/2020 0932   LDLCALC 111 (H) 02/27/2020 0932    Hepatic Function Latest Ref Rng & Units 08/29/2020 07/02/2020 05/08/2020  Total Protein 6.1 - 8.1 g/dL 6.8 6.7 6.8  Albumin 3.5 - 5.0 g/dL - - 4.1  AST 10 - 35 U/L 27 72(H) 63(H)  ALT 6 - 29 U/L 24 55(H) 63(H)  Alk Phosphatase 38 - 126 U/L - - 109  Total Bilirubin 0.2 - 1.2 mg/dL 0.4 0.4 0.4  Bilirubin, Direct 0.0 - 0.5 mg/dL - - -    Lab Results  Component Value Date/Time   TSH 2.120 12/27/2020 02:29 PM   TSH 2.14 05/01/2020 02:05 PM   FREET4 0.70 12/08/2018 12:15 PM    CBC Latest Ref Rng & Units 02/13/2021 08/29/2020 07/02/2020  WBC 4.0 - 10.5 K/uL 5.4 4.9 4.0  Hemoglobin 12.0 - 15.0 g/dL 11.6(L) 11.1(L) 11.0(L)  Hematocrit 36.0 - 46.0 % 34.7(L) 33.4(L) 33.7(L)  Platelets 150.0 - 400.0 K/uL 237.0 252 198    Lab Results  Component Value Date/Time   VD25OH 36.03 02/27/2020 09:32 AM   VD25OH 38.37 07/28/2019 08:02 AM   Clinical ASCVD:  The 10-year ASCVD risk score Mikey Bussing DC Jr., et al., 2013) is: 16.3%   Values used to calculate the score:     Age: 82 years     Sex: Female     Is Non-Hispanic African American: No     Diabetic: No     Tobacco smoker: No     Systolic Blood Pressure: 882 mmHg     Is BP treated: Yes     HDL Cholesterol: 38.6 mg/dL     Total Cholesterol: 181 mg/dL    Other: (CHADS2VASc if Afib, PHQ9  if depression, MMRC or CAT for COPD, ACT, DEXA)  Social History   Tobacco Use  Smoking Status Never Smoker  Smokeless Tobacco Never Used   BP Readings from Last 3 Encounters:  02/13/21 112/70  02/05/21 (!) 147/70  01/29/21 122/80   Pulse Readings from Last 3 Encounters:  02/13/21 72  02/05/21 72  01/29/21 75   Wt Readings from Last 3 Encounters:  02/13/21 158 lb 6.1 oz (71.8 kg)  02/05/21 163 lb 3.2 oz (74 kg)  01/29/21 165 lb (74.8 kg)    Assessment: Review of patient past medical history, allergies, medications, health status, including review of consultants reports, laboratory and other test data, was performed as part of comprehensive evaluation and provision of chronic care management services.   SDOH:  (Social Determinants of Health) assessments and interventions performed: Yes.    CCM Care Plan  Allergies  Allergen Reactions  . Latex Rash and Other (See Comments)    Blisters, also    Medications Reviewed Today    Reviewed by Madelin Rear, [facility] (Pharmacist) on 02/20/21 at 1140  Med List Status: <None>  Medication Order Taking? Sig Documenting Provider Last Dose Status Informant  ALPRAZolam (XANAX) 0.25 MG tablet [id number] Yes Take 0.5 tablets (0.125 mg total) by mouth daily as needed for anxiety. [name], [place] Taking Active Self  Ascorbic Acid (VITAMIN C) 1000 MG tablet [id number]  Take 1,000 mg by mouth daily. [provider]  Active Self  atorvastatin (LIPITOR) 40 MG tablet [id number] Yes TAKE 1 TABLET BY MOUTH AT  BEDTIME [name], [place]  Active   butalbital-acetaminophen-caffeine (FIORICET) 50-325-40 MG tablet [id number]  TAKE 1 TO 2 TABLETS BY  MOUTH EVERY 6 HOURS AS  NEEDED FOR HEADACHE(S) .  MANUFACTURER RECOMMENDS NOT EXCEEDING 6 TABLETS/DAY [name], [place]  Active   Cholecalciferol (VITAMIN D-3 PO) [id number]  Take 1 capsule by mouth daily. [provider]  Active Self  cyanocobalamin (,VITAMIN B-12,) 1000 MCG/ML injection  [id number] Yes Inject 1 mL (1,000 mcg total) into the muscle every 30 (thirty) days. [name], [place]  Active Self  estradiol (ESTRACE) 0.1 MG/GM vaginal cream [id number] Yes Estrogen Cream Instruction Discard applicator Apply pea sized amount to tip of finger to urethra before bed. Wash hands well after application. Use Monday, Wednesday and Friday Billey Co, MD  Active   irbesartan (AVAPRO) 150 MG tablet [id number] Yes TAKE 1 TABLET BY MOUTH  DAILY Leamon Arnt, MD  Active   ondansetron (ZOFRAN) 8 MG tablet [id number]  Take 8 mg by mouth every 8 (eight) hours as needed for nausea or vomiting. [provider]  Active Self  pantoprazole (PROTONIX) 40 MG tablet [id number] Yes Take 1 tablet (40 mg total) by mouth daily before breakfast. Raiford Noble [place], DO  Active   PARoxetine (PAXIL) 20 MG tablet [id number] Yes TAKE 3 TABLETS BY MOUTH IN  THE [place], [place], [place]  Active   polyethylene glycol powder (GLYCOLAX/MIRALAX) 17 GM/SCOOP powder [id number]  Take 17 g by mouth daily as needed for mild constipation. [provider]  Active Self  SUMAtriptan (IMITREX) 100 MG tablet [id number]  Take 1 tablet (100 mg total) by mouth 2 (two) times daily as needed. At least 2 hrs between doses as needed bid  Patient taking differently: Take 100 mg by mouth 2 (two) times daily as needed for migraine or headache (and may repeat once in 2 hours, if no relief).   [name], [place]  Active   valACYclovir (VALTREX) 1000 MG tablet [id number]  Take two tablets daily x 5 days. Then for future outbreaks, take 2 tablets by mouth every 12 hours for 1 day total [place], [place], [place]  Active   vitamin E 180 MG (400 UNITS) capsule [id number]  Take 400 Units by mouth daily. [provider]  Active Self          Patient Active Problem List   Diagnosis Date Noted  . Recurrent UTI 06/28/2020  . Inflammatory arthritis 05/16/2020  . Erosive gastritis   . Benign neoplasm of ascending  colon   . Heme + stool   . Change in bowel habit   . Anemia 05/01/2020  . Dyspnea 05/01/2020  . Generalized weakness 05/01/2020  . UTI (urinary tract infection) 05/01/2020  . Symptomatic anemia 05/01/2020  . Glaucoma of both eyes   . NASH (nonalcoholic steatohepatitis)   . Mitral valve prolapse   . Thymoma -- managed by [facility] - extensive documentation in Care Everywhere 03/22/2019  . Breast cancer ([place]) 03/18/2019  . Mass of upper lobe of right lung 01/20/2019  . B12 deficiency 12/19/2018  . Stress incontinence in female 12/19/2018  . Dyslipidemia 12/14/2018  .  OSA (obstructive sleep apnea) 12/07/2018  . History of breast cancer 12/07/2018  . Cognitive complaints 11/25/2018  . False positive stress test 01/29/2015  . Migraine without aura and without status migrainosus, not intractable 12/18/2014  . Small vessel disease, cerebrovascular 12/18/2014  . GERD (gastroesophageal reflux disease) 12/02/2014  . HTN (hypertension) 12/27/2013  . Anxiety 12/27/2013  . Depression 11/03/2012  . Elevated LFTs 11/03/2012  . Fibromyalgia 11/03/2012  . IBS (irritable bowel syndrome) 11/03/2012  . Lymphocytic colitis 11/03/2012  . Paget's disease of nipple ([place]) 11/03/2012  . Vitamin D deficiency 11/03/2012  . Osteoarthritis 11/03/2012    Immunization History  Administered Date(s) Administered  . Fluad Quad(high Dose 65+) 07/28/2019, 07/02/2020  . Influenza Split 08/10/2014  . Influenza Whole 08/04/2017  . Influenza-Unspecified 07/20/2014, 07/20/2018  . PFIZER(Purple Top)SARS-COV-2 Vaccination 12/12/2019, 01/02/2020  . Pneumococcal Conjugate-13 08/16/2015  . Pneumococcal Polysaccharide-23 07/17/2020  . Tdap 12/08/2018   Conditions to be addressed/monitored: HLD HTN OSA GERD MDD Recurrent UTI Stress incontinence  Care Plan : [place]  Updates made by Madelin Rear, [facility] since 02/21/2021 12:00 AM    Problem: HLD HTN OSA GERD MDD Recurrent UTI Stress incontinence   Priority:  High    Long-Range Goal: Disease Management   Start Date: 02/21/2021  Expected End Date: 02/21/2022  This Visit's Progress: On track  Priority: High  Note:    Current Barriers:  . Could work towards LDL goal < 100 - strength/exercise tolerance now improving   Pharmacist Clinical Goal(s):  Marland Kitchen Patient will contact provider office for questions/concerns as evidenced notation of same in electronic health record through collaboration with PharmD and provider.   Interventions: . 1:1 collaboration with [name], PA regarding development and update of comprehensive plan of care as evidenced by provider attestation and co-signature . Inter-disciplinary care team collaboration (see longitudinal plan of care) . Comprehensive medication review performed; medication list updated in electronic medical record . No changes to medications   Hypertension (BP goal <130/80) -Controlled, consistently at goal at OVs -Current treatment: . Irbesartan 150 mg once daily  -No home monitoring, has home cuff if needed  -Current dietary habits: low fat diet. Current exercise habits: starting to increase exercise frequency - hgb had dropped to 6.6 04/2020, improved with iron infxns to 11.6 02/13/2021. Feeling much better now.  -Denies hypotensive/hypertensive symptoms -Counseled to monitor BP at home as directed, document, and provide log at future appointments -Recommended to continue current medication  Hyperlipidemia: (LDL goal < 100) -Not ideally controlled, last LDL 111, consistently close to goal (+/-) -10 yr ASCVD 16%, primary prevention  -Current treatment: . Atorvastatin 40 mg once daily  -Denies tolerability issues or concerns -Educated on Cholesterol goals; patient to start walking more as tolerated -Recommended to continue current medication  GERD/errosive gastritis (Goal to minimize symptoms) -Controlled, no issues or need for trigger avoidance -Receives B12 injections at home - daughter  provides them -Avoids NSAIDs, occasional use of APAP for OA pain.  -Current regimen  Pantoprazole 40 mg once daily -Continue current management  Depression (Goal: minimize symptoms) -Controlled -Current treatment: . Paroxetine 60 mg once daily -PHQ9: 0. Denies any worsening symptoms or side effects. Feels content with management -Educated on Benefits of medication for symptom control -Recommended to continue current medication  Osteopenia (Goal maintain bone density, ensure optimization of supplements) -Controlled -Last DEXA Scan: 02/2020 - osteopenia w/ plan to repeat in 2 yrs. FRAX score: 10 year major osteoporotic risk: 7.6%. 10 year hip fracture risk: 0.7%. -  Long term SSRI and PPI use -Patient is not a candidate for pharmacologic treatment -Current treatment  . Vitamin D3 1000 units daily  -Recommend weight-bearing and muscle strengthening exercises for building and maintaining bone density. -Recommended to continue current medication  Patient Goals/Self-Care Activities . Patient will:  - take medications as prescribed target a minimum of 150 minutes of moderate intensity exercise weekly  Follow Up Plan: [place] f/u telephone visit 6-8 months Medication Assistance: None required.  Patient affirms current coverage meets needs.  Patient's preferred pharmacy is:  [id number] [place] [address] [place]Phone: [phone]: [address] [place] [address] [place]Phone: [phone]: [phone] Follow Up:  Patient agrees to Care Plan and Follow-up.  Plan: rph f/u telephone call 6 months      Future Appointments  Date Time Provider DBruno 02/22/2021  9:00 AM LBPU-PFT RM [facility]-PULCARE None  02/22/2021 10:15 AM OLaurin Coder MD L[facility]-PULCARE None  02/25/2021  1:00 PM GI-[facility] [place] [facility]   03/20/2021 10:20 AM [facility] ECHO 2 [facility]  05/16/2021 11:40 AM [name] MD [facility] 07/11/2021 10:45 AM SBilley Co MD BUA-BUA None  08/27/2021  3:30 PM [facility] CCM PHARMACIST [facility] PEC   JMadelin Rear PharmD, CPP Clinical Pharmacist Practitioner  [facility]  [phone]

## 2021-02-22 ENCOUNTER — Ambulatory Visit (INDEPENDENT_AMBULATORY_CARE_PROVIDER_SITE_OTHER): Payer: Medicare Other | Admitting: Pulmonary Disease

## 2021-02-22 ENCOUNTER — Other Ambulatory Visit: Payer: Self-pay

## 2021-02-22 ENCOUNTER — Encounter: Payer: Self-pay | Admitting: Pulmonary Disease

## 2021-02-22 VITALS — BP 126/66 | HR 74 | Temp 97.4°F | Ht 63.0 in | Wt 161.0 lb

## 2021-02-22 DIAGNOSIS — R9389 Abnormal findings on diagnostic imaging of other specified body structures: Secondary | ICD-10-CM

## 2021-02-22 DIAGNOSIS — R0602 Shortness of breath: Secondary | ICD-10-CM

## 2021-02-22 LAB — PULMONARY FUNCTION TEST
DL/VA % pred: 112 %
DL/VA: 4.65 ml/min/mmHg/L
DLCO cor % pred: 78 %
DLCO cor: 14.48 ml/min/mmHg
DLCO unc % pred: 73 %
DLCO unc: 13.61 ml/min/mmHg
FEF 25-75 Post: 1.74 L/sec
FEF 25-75 Pre: 1.33 L/sec
FEF2575-%Change-Post: 30 %
FEF2575-%Pred-Post: 108 %
FEF2575-%Pred-Pre: 83 %
FEV1-%Change-Post: 6 %
FEV1-%Pred-Post: 86 %
FEV1-%Pred-Pre: 81 %
FEV1-Post: 1.75 L
FEV1-Pre: 1.64 L
FEV1FVC-%Change-Post: 5 %
FEV1FVC-%Pred-Pre: 103 %
FEV6-%Change-Post: 1 %
FEV6-%Pred-Post: 83 %
FEV6-%Pred-Pre: 82 %
FEV6-Post: 2.15 L
FEV6-Pre: 2.11 L
FEV6FVC-%Pred-Post: 105 %
FEV6FVC-%Pred-Pre: 105 %
FVC-%Change-Post: 1 %
FVC-%Pred-Post: 79 %
FVC-%Pred-Pre: 78 %
FVC-Post: 2.15 L
FVC-Pre: 2.11 L
Post FEV1/FVC ratio: 81 %
Post FEV6/FVC ratio: 100 %
Pre FEV1/FVC ratio: 77 %
Pre FEV6/FVC Ratio: 100 %
RV % pred: 75 %
RV: 1.7 L
TLC % pred: 77 %
TLC: 3.8 L

## 2021-02-22 NOTE — Progress Notes (Signed)
Autumn Conley    [id number]    05-25-1946  Primary Care Physician:W[place]ley, [name], [place]  Referring Physician: [name], [place],  [place] [address]  Chief complaint:   Shortness of breath of about 2 to 3 months duration Breathing remains about the same since her last visit  HPI:  Continues with shortness of breath with activity  Breathing did improve with iron transfusion  Was recently hospitalized and had endoscopy performed in the work-up of iron deficiency anemia-negative findings.  She is short of breath with mild to moderate exertion Short of breath with walking up steps is  Never smoker No history of alcohol use No occupational predisposition to lung disease  History of mastectomy for breast cancer  Denies significant arthritic pain or discomfort History of hypertension, hypercholesterolemia, sleep apnea  Has not been able to use a CPAP regularly only of recent  She does have an occasional cough History of anxiety  A good component of deconditioning may be contributing to symptoms are present as she has not been very active   Outpatient Encounter Medications as of 02/22/2021  Medication Sig  . ALPRAZolam (XANAX) 0.25 MG tablet Take 0.5 tablets (0.125 mg total) by mouth daily as needed for anxiety.  . Ascorbic Acid (VITAMIN C) 1000 MG tablet Take 1,000 mg by mouth daily.  Marland Kitchen atorvastatin (LIPITOR) 40 MG tablet TAKE 1 TABLET BY MOUTH AT  BEDTIME  . butalbital-acetaminophen-caffeine (FIORICET) 50-325-40 MG tablet TAKE 1 TO 2 TABLETS BY  MOUTH EVERY 6 HOURS AS  NEEDED FOR HEADACHE(S) .  MANUFACTURER RECOMMENDS NOT EXCEEDING 6 TABLETS/DAY  . Cholecalciferol (VITAMIN D-3 PO) Take 1 capsule by mouth daily.  . cyanocobalamin (,VITAMIN B-12,) 1000 MCG/ML injection Inject 1 mL (1,000 mcg total) into the muscle every 30 (thirty) days.  Marland Kitchen estradiol (ESTRACE) 0.1 MG/GM vaginal cream Estrogen Cream Instruction Discard applicator Apply pea  sized amount to tip of finger to urethra before bed. Wash hands well after application. Use Monday, Wednesday and Friday  . irbesartan (AVAPRO) 150 MG tablet TAKE 1 TABLET BY MOUTH  DAILY  . ondansetron (ZOFRAN) 8 MG tablet Take 8 mg by mouth every 8 (eight) hours as needed for nausea or vomiting.  . pantoprazole (PROTONIX) 40 MG tablet Take 1 tablet (40 mg total) by mouth daily before breakfast.  . PARoxetine (PAXIL) 20 MG tablet TAKE 3 TABLETS BY MOUTH IN  THE MORNING  . polyethylene glycol powder (GLYCOLAX/MIRALAX) 17 GM/SCOOP powder Take 17 g by mouth daily as needed for mild constipation.  . SUMAtriptan (IMITREX) 100 MG tablet Take 1 tablet (100 mg total) by mouth 2 (two) times daily as needed. At least 2 hrs between doses as needed bid (Patient taking differently: Take 100 mg by mouth 2 (two) times daily as needed for migraine or headache (and may repeat once in 2 hours, if no relief).)  . valACYclovir (VALTREX) 1000 MG tablet Take two tablets daily x 5 days. Then for future outbreaks, take 2 tablets by mouth every 12 hours for 1 day total  . vitamin E 180 MG (400 UNITS) capsule Take 400 Units by mouth daily.   No facility-administered encounter medications on file as of 02/22/2021.    Allergies as of 02/22/2021 - Review Complete 02/22/2021  Allergen Reaction Noted  . Latex Rash and Other (See Comments) 05/01/2020    Past Medical History:  Diagnosis Date  . Anemia   . Anxiety   . Breast cancer (  [place])   . Depression   . Dyspnea   . Fibromyalgia    "some; not chronic" (11/29/2014)  . GERD (gastroesophageal reflux disease)   . Glaucoma of both eyes   . Headache   . Hypertension   . IBS (irritable bowel syndrome)   . Mitral valve prolapse   . NASH (nonalcoholic steatohepatitis)   . Osteoarthritis   . PONV (postoperative nausea and vomiting)   . Stroke ([place])   . 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[facility])     Past Surgical History:  Procedure Laterality Date  . ABDOMINAL HYSTERECTOMY  1980  .  APPENDECTOMY  1953  . BIOPSY  05/03/2020   Procedure: BIOPSY;  Surgeon: Gatha Mayer, MD;  Location: [facility];  Service: Endoscopy;;  . BREAST BIOPSY Left   . BREAST LUMPECTOMY Left   . CATARACT EXTRACTION, BILATERAL  2018  . COLONOSCOPY WITH PROPOFOL N/A 05/03/2020   Procedure: COLONOSCOPY WITH PROPOFOL;  Surgeon: Gatha Mayer, MD;  Location: [facility] ENDOSCOPY;  Service: Endoscopy;  Laterality: N/A;  . ESOPHAGOGASTRODUODENOSCOPY (EGD) WITH PROPOFOL N/A 05/03/2020   Procedure: ESOPHAGOGASTRODUODENOSCOPY (EGD) WITH PROPOFOL;  Surgeon: Gatha Mayer, MD;  Location: [facility];  Service: Endoscopy;  Laterality: N/A;  . MASTECTOMY Left ~ 2009  . PARASTERNAL EXPLORATION Right 02/14/2019   Procedure: PARASTERNAL MEDIAL EXPLORATION WITH BIOPIES.;  Surgeon: [name], MD;  Location: [facility];  Service: Thoracic;  Laterality: Right;  . POLYPECTOMY  05/03/2020   Procedure: POLYPECTOMY;  Surgeon: Gatha Mayer, MD;  Location: [facility] ENDOSCOPY;  Service: Endoscopy;;    Family History  Problem Relation Age of Onset  . Lung cancer Mother 81  . Prostate cancer Brother 61  . Bladder Cancer Neg Hx   . Kidney cancer Neg Hx     Social History   Socioeconomic History  . Marital status: Married    Spouse name: Not on file  . Number of children: 1  . Years of education: 20  . Highest education level: Not on file  Occupational History  . Occupation: Retired  Tobacco Use  . Smoking status: Never Smoker  . Smokeless tobacco: Never Used  Vaping Use  . Vaping Use: Never used  Substance and Sexual Activity  . Alcohol use: No  . Drug use: No  . Sexual activity: Not Currently  Other Topics Concern  . Not on file  Social History Narrative   Lives at home with her husband.   Right-handed.   2 cups caffeine/day.   Social Determinants of Health   Financial Resource Strain: Not on file  Food Insecurity: Not on file  Transportation Needs: Not on file  Physical Activity: Not on file   Stress: Not on file  Social Connections: Not on file  Intimate Partner Violence: Not on file    Review of Systems  Constitutional: Positive for fatigue.  Respiratory: Positive for cough and shortness of breath.   Psychiatric/Behavioral: Positive for sleep disturbance.    Vitals:   02/22/21 1011  BP: 126/66  Pulse: 74  Temp: (!) 97.4 F (36.3 C)  SpO2: 95%     Physical Exam Constitutional:      Appearance: Normal appearance.  HENT:     Head: Normocephalic and atraumatic.     Nose: No congestion.     Mouth/Throat:     Mouth: Mucous membranes are moist.  Eyes:     General:        Right eye: No discharge.        Left eye:  No discharge.  Cardiovascular:     Rate and Rhythm: Normal rate and regular rhythm.     Heart sounds: No murmur heard. No friction rub.  Pulmonary:     Effort: No respiratory distress.     Breath sounds: No stridor. No wheezing or rhonchi.  Musculoskeletal:     Cervical back: No rigidity or tenderness.  Neurological:     Mental Status: She is alert.  Psychiatric:        Mood and Affect: Mood normal.    Data Reviewed: Echocardiogram -Showing moderate pericardial effusion, diastolic dysfunction, mitral stenosis  CT scan from July 2021 was reviewed showing anterior mediastinal mass CT scan chest from 12/25/2020 showing fibrosis, bronchiectasis, evolution of radiation fibrosis  Pulmonary function test reviewed showing no obstruction, no significant bronchodilator response mild restriction, Mild decrease in diffusing capacity  Assessment:  Shortness of breath -Shortness of breath appears to be multifactorial -Fibrosis/bronchiectasis on CT -Anemia -Acute component of shortness of breath may also be related to deconditioning -Pericardial effusion  PFT only shows mild restriction with mild reduction in diffusing capacity which may be consistent with receiving radiation treatments in the past  Plan/Recommendations:  .  Graded exercises as  tolerated will help  .  We will obtain a PET scan to follow-up on abnormal CT scan findings which showed soft tissue mass which may be related to progressive fibrosis  .  Importance of physical activity was discussed extensively  .  Tentative follow-up in a month  I spent 30 minutes dedicated to the care of this patient on the date of this encounter to include previsit review of records, face-to-face time with the patient discussing conditions above, post visit ordering of testing, clinical documentation with electronic health record and communicated necessary findings to members of the patient's care team  Sherrilyn Rist MD [facility] 02/22/2021, 10:14 AM  CC: [name], PA

## 2021-02-22 NOTE — Patient Instructions (Signed)
Full PFT performed today. °

## 2021-02-22 NOTE — Progress Notes (Signed)
Full PFT performed today. °

## 2021-02-22 NOTE — Patient Instructions (Addendum)
Shortness of breath -Graded exercise as tolerated  CT scan changes with questionable nodule/scarring -We will order PET scan -Give me a call within a few days after having the PET scan done if you do not hear from [place]  I will see you back in a month  Call with significant concerns

## 2021-02-25 ENCOUNTER — Ambulatory Visit
Admission: RE | Admit: 2021-02-25 | Discharge: 2021-02-25 | Disposition: A | Discharge status: Home / Self Care | Payer: Medicare Other | Source: Ambulatory Visit | Attending: Physician Assistant | Admitting: Physician Assistant

## 2021-02-25 DIAGNOSIS — N83292 Other ovarian cyst, left side: Secondary | ICD-10-CM | POA: Diagnosis not present

## 2021-02-25 DIAGNOSIS — Z90710 Acquired absence of both cervix and uterus: Secondary | ICD-10-CM | POA: Diagnosis not present

## 2021-02-25 DIAGNOSIS — R102 Pelvic and perineal pain: Secondary | ICD-10-CM

## 2021-02-26 ENCOUNTER — Ambulatory Visit: Payer: Medicare Other | Admitting: Physician Assistant

## 2021-03-06 ENCOUNTER — Encounter (HOSPITAL_COMMUNITY)
Admission: RE | Admit: 2021-03-06 | Discharge: 2021-03-06 | Disposition: A | Discharge status: Home / Self Care | Payer: Medicare Other | Source: Ambulatory Visit | Attending: Pulmonary Disease | Admitting: Pulmonary Disease

## 2021-03-06 ENCOUNTER — Other Ambulatory Visit: Payer: Self-pay

## 2021-03-06 DIAGNOSIS — X58XXXA Exposure to other specified factors, initial encounter: Secondary | ICD-10-CM | POA: Insufficient documentation

## 2021-03-06 DIAGNOSIS — C37 Malignant neoplasm of thymus: Secondary | ICD-10-CM | POA: Diagnosis not present

## 2021-03-06 DIAGNOSIS — I313 Pericardial effusion (noninflammatory): Secondary | ICD-10-CM | POA: Diagnosis not present

## 2021-03-06 DIAGNOSIS — J90 Pleural effusion, not elsewhere classified: Secondary | ICD-10-CM | POA: Diagnosis not present

## 2021-03-06 DIAGNOSIS — I2510 Atherosclerotic heart disease of native coronary artery without angina pectoris: Secondary | ICD-10-CM | POA: Diagnosis not present

## 2021-03-06 DIAGNOSIS — K314 Gastric diverticulum: Secondary | ICD-10-CM | POA: Insufficient documentation

## 2021-03-06 DIAGNOSIS — I700 Atherosclerosis of aorta: Secondary | ICD-10-CM | POA: Insufficient documentation

## 2021-03-06 DIAGNOSIS — R911 Solitary pulmonary nodule: Secondary | ICD-10-CM | POA: Insufficient documentation

## 2021-03-06 DIAGNOSIS — S2231XA Fracture of one rib, right side, initial encounter for closed fracture: Secondary | ICD-10-CM | POA: Insufficient documentation

## 2021-03-06 DIAGNOSIS — R9389 Abnormal findings on diagnostic imaging of other specified body structures: Secondary | ICD-10-CM | POA: Insufficient documentation

## 2021-03-06 LAB — GLUCOSE, CAPILLARY: Glucose-Capillary: 112 mg/dL — ABNORMAL HIGH (ref 70–99)

## 2021-03-06 MED ORDER — FLUDEOXYGLUCOSE F - 18 (FDG) INJECTION
8.0000 | Freq: Once | INTRAVENOUS | Status: AC | PRN
  Administered 2021-03-06: 8 via INTRAVENOUS

## 2021-03-12 ENCOUNTER — Other Ambulatory Visit: Payer: Self-pay | Admitting: *Deleted

## 2021-03-12 MED ORDER — SUMATRIPTAN SUCCINATE 100 MG PO TABS: 100.0000 mg | ORAL_TABLET | Freq: Two times a day (BID) | ORAL | 1 refills | Status: DC | PRN

## 2021-03-13 NOTE — Telephone Encounter (Signed)
Received message for patient asking for her PET scan results.   AO, can you please advise? Thanks!

## 2021-03-14 NOTE — Telephone Encounter (Signed)
Called and spoke with patient.  Dr. Judson Roch results and recommendations given to Patient and her Husband. Understanding stated.  Nothing further at this time.

## 2021-03-14 NOTE — Telephone Encounter (Signed)
Called patient, left message  Please, call patient back  PET scan did show minimal activity, all the areas that showed activity was less than previous PET scan -this suggests nonprogression, benign process  Some fluid around the heart  -No intervention is needed at present  -Needs to be followed up with scans in the future  -Keep scheduled appointments

## 2021-03-20 ENCOUNTER — Other Ambulatory Visit: Payer: Self-pay

## 2021-03-20 ENCOUNTER — Ambulatory Visit (HOSPITAL_COMMUNITY): Payer: Medicare Other | Attending: Internal Medicine

## 2021-03-20 DIAGNOSIS — I313 Pericardial effusion (noninflammatory): Secondary | ICD-10-CM | POA: Diagnosis not present

## 2021-03-20 DIAGNOSIS — I3139 Other pericardial effusion (noninflammatory): Secondary | ICD-10-CM

## 2021-03-20 LAB — ECHOCARDIOGRAM LIMITED
AV Mean grad: 15 mmHg
AV Peak grad: 26 mmHg
Ao pk vel: 2.55 m/s
Area-P 1/2: 2.6 cm2
P 1/2 time: 526 msec
S' Lateral: 2.5 cm

## 2021-03-25 ENCOUNTER — Encounter: Payer: Self-pay | Admitting: Internal Medicine

## 2021-03-25 ENCOUNTER — Other Ambulatory Visit: Payer: Self-pay

## 2021-03-25 ENCOUNTER — Ambulatory Visit: Payer: Medicare Other | Admitting: Internal Medicine

## 2021-03-25 ENCOUNTER — Ambulatory Visit (INDEPENDENT_AMBULATORY_CARE_PROVIDER_SITE_OTHER): Payer: Medicare Other | Admitting: Internal Medicine

## 2021-03-25 VITALS — BP 110/68 | HR 80 | Ht 63.0 in | Wt 162.2 lb

## 2021-03-25 DIAGNOSIS — E785 Hyperlipidemia, unspecified: Secondary | ICD-10-CM

## 2021-03-25 DIAGNOSIS — I10 Essential (primary) hypertension: Secondary | ICD-10-CM

## 2021-03-25 DIAGNOSIS — Z79899 Other long term (current) drug therapy: Secondary | ICD-10-CM | POA: Diagnosis not present

## 2021-03-25 DIAGNOSIS — R002 Palpitations: Secondary | ICD-10-CM | POA: Diagnosis not present

## 2021-03-25 DIAGNOSIS — I313 Pericardial effusion (noninflammatory): Secondary | ICD-10-CM

## 2021-03-25 DIAGNOSIS — R0600 Dyspnea, unspecified: Secondary | ICD-10-CM

## 2021-03-25 DIAGNOSIS — D649 Anemia, unspecified: Secondary | ICD-10-CM

## 2021-03-25 DIAGNOSIS — I3139 Other pericardial effusion (noninflammatory): Secondary | ICD-10-CM

## 2021-03-25 MED ORDER — FUROSEMIDE 20 MG PO TABS: 20.0000 mg | ORAL_TABLET | Freq: Every day | ORAL | 0 refills | Status: DC

## 2021-03-25 NOTE — Patient Instructions (Addendum)
Medication Instructions:  START: LASIX 1m DAILY FOR 14 DAYS. PLEASE CALL [place] LET [place] HOW YOUR DOING IN 14 DAYS  *If you need a refill on your cardiac medications before your next appointment, please call your pharmacy*  Lab Work: BMET & MAG- PLEASE RETURN FOR THIS IS 7-10 DAYS  If you have labs (blood work) drawn today and your tests are completely normal, you will receive your results only by: .Marland KitchenMyChart Message (if you have MyChart) OR . A paper copy in the mail If you have any lab test that is abnormal or we need to change your treatment, we will call you to review the results.  Testing/Procedures: Your physician has requested that you have an echocardiogram in 6 weeks. Echocardiography is a painless test that uses sound waves to create images of your heart. It provides your doctor with information about the size and shape of your heart and how well your heart's chambers and valves are working. You may receive an ultrasound enhancing agent through an IV if needed to better visualize your heart during the echo.This procedure takes approximately one hour. There are no restrictions for this procedure. This will take place at the [address].   Follow-Up: [facility] and your [facility] needs are our priority.  As part of our continuing mission to provide you with exceptional heart care, we have created designated Provider Care Teams.  These Care Teams include your primary Cardiologist (physician) and Advanced Practice Providers (APPs -  Physician Assistants and Nurse Practitioners) who all work together to provide you with the care you need, when you need it.  Your next appointment:   6 week(s)  The format for your next appointment:   In Person  Provider:   You may see Dr. AMargaretann Lovelessor one of the following Advanced Practice Providers on your designated Care Team:    RRosaria Ferries PA-C  KJory Sims DNP, ANP  ANY APP

## 2021-03-25 NOTE — Progress Notes (Signed)
Cardiology Office Note:    Date:  03/25/2021   ID:  Autumn Conley, DOB 05-04-1946, MRN [id number]  PCP:  [name], PA  Cardiologist:  None  Electrophysiologist:  None   Referring MD: [name], PA   Chief Complaint/Reason for Referral: F/u pericardial effusion  History of Present Illness:    Autumn Conley is a 75 y.o. 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 Symptomatic anemia related to malignancy, now started on iron supplementation with IV iron.   She is accompanied by her husband and daughter for this visit, and they also contributes to her history. We performed an echocardiogram as part of the evaluation of palpitations and shortness of breath, and she was noted to have a moderate pericardial effusion.  This had not changed on close follow-up echo, however on 3 month follow up echo it has increased in size slightly by report. We discussed these results today.  She believes her shortness of breath is better but her family is still concerned that she is not back to baseline at all. Bendopnea is a prominent feature of her symptoms. He states she used to walk 5 miles a day but now gets "out of breath" walking to the car. She wonders if diuretic therapy will help. We discussed hyperdynamic ventricle and how this may, or may not, be of symptomatic benefit if she underfills her LV while on diuretic therapy.    Prior to the iron infusions, she wore a heart monitor and reports having significant episodes of shortness of breath which she documented on cardiac monitor. Symptoms noted but no malignant arrhythmia. Some symptomatic supraventricular ectopy and SVT, report of junctional rhythm. No afib. She was unable to walk from her chair to the restroom without needing to sit again for a few  minutes. She also was using a pulse-ox frequently but did not notice much improvement.   She reports having a cold over the Winter that lasted approximately 8 weeks, after Christmas. She had a significant cough that improved with 2 prescriptions from her PCP (Hydrocodone and Tessalon pearls).  We discussed that this could be one of the etiologies for her pericardial effusion given normal thyroid function and no other clear cause.  We discussed viral etiology for pericardial effusion and that this will likely resolve in time.  No features of cardiac tamponade on echo from 3/4 or 3/25 or 03/20/21, though effusion may have increased slightly.  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[facility])     Past Surgical History:  Procedure Laterality Date   [place]   BIOPSY  05/03/2020   Procedure: BIOPSY;  Surgeon: Gatha Mayer, MD;  [facility] ENDOSCOPY;  Service: Endoscopy;;   BREAST BIOPSY Left    BREAST  LUMPECTOMY Left    CATARACT EXTRACTION, BILATERAL  2018   COLONOSCOPY WITH PROPOFOL N/A 05/03/2020   Procedure: COLONOSCOPY WITH PROPOFOL;  Surgeon: Gatha Mayer, MD;  Location: [facility];  Service: Endoscopy;  Laterality: N/A;   ESOPHAGOGASTRODUODENOSCOPY (EGD) WITH PROPOFOL N/A 05/03/2020   Procedure: ESOPHAGOGASTRODUODENOSCOPY (EGD) WITH PROPOFOL;  Surgeon: Gatha Mayer, MD;  Location: [facility];  Service: Endoscopy;  Laterality: N/A;   MASTECTOMY Left ~ 2009   PARASTERNAL EXPLORATION Right 02/14/2019   Procedure: PARASTERNAL MEDIAL  EXPLORATION WITH BIOPIES.;  Surgeon: [name], MD;  Location: [facility];  Service: Thoracic;  Laterality: Right;   POLYPECTOMY  05/03/2020   Procedure: POLYPECTOMY;  Surgeon: Gatha Mayer, MD;  Location: [facility] ENDOSCOPY;  Service: Endoscopy;;    Current Medications: Current Meds  Medication Sig   ALPRAZolam (XANAX) 0.25 MG tablet Take 0.5 tablets (0.125 mg total) by mouth daily as needed for anxiety.   Ascorbic Acid (VITAMIN C) 1000 MG tablet Take 1,000 mg by mouth daily.   atorvastatin (LIPITOR) 40 MG tablet TAKE 1 TABLET BY MOUTH AT  BEDTIME   AZO-CRANBERRY PO Take by mouth 2 (two) times daily. 1 tab twice daily   butalbital-acetaminophen-caffeine (FIORICET) 50-325-40 MG tablet TAKE 1 TO 2 TABLETS BY  MOUTH EVERY 6 HOURS AS  NEEDED FOR HEADACHE(S) .  MANUFACTURER RECOMMENDS NOT EXCEEDING 6 TABLETS/DAY   Cholecalciferol (VITAMIN D-3 PO) Take 1 capsule by mouth daily.   cyanocobalamin (,VITAMIN B-12,) 1000 MCG/ML injection Inject 1 mL (1,000 mcg total) into the muscle every 30 (thirty) days.   docusate sodium (COLACE) 100 MG capsule Take 100 mg by mouth 2 (two) times daily. Pt isn't sure if she is taking 50 mg or 100 mg bid   estradiol (ESTRACE) 0.1 MG/GM vaginal cream Estrogen Cream Instruction Discard applicator Apply pea sized amount to tip of finger to urethra before bed. Wash hands well after application. Use Monday, Wednesday and Friday   furosemide (LASIX) 20 MG tablet Take 1 tablet (20 mg total) by mouth daily. TAKE 1 TABLET BY MOUTH DAILY FOR 14 DAYS.   irbesartan (AVAPRO) 150 MG tablet TAKE 1 TABLET BY MOUTH  DAILY   ondansetron (ZOFRAN) 8 MG tablet Take 8 mg by mouth every 8 (eight) hours as needed for nausea or vomiting.   pantoprazole (PROTONIX) 40 MG tablet Take 1 tablet (40 mg total) by mouth daily before breakfast.   PARoxetine (PAXIL) 20 MG tablet TAKE 3 TABLETS BY MOUTH IN  THE MORNING   polyethylene glycol powder (GLYCOLAX/MIRALAX) 17 GM/SCOOP powder Take 17 g by mouth  daily as needed for mild constipation.   SUMAtriptan (IMITREX) 100 MG tablet Take 1 tablet (100 mg total) by mouth 2 (two) times daily as needed. At least 2 hrs between doses as needed bid   valACYclovir (VALTREX) 1000 MG tablet Take two tablets daily x 5 days. Then for future outbreaks, take 2 tablets by mouth every 12 hours for 1 day total   vitamin E 180 MG (400 UNITS) capsule Take 400 Units by mouth daily.     Allergies:   Latex   Social History   Tobacco Use   Smoking status: Never Smoker   Smokeless tobacco: Never Used  Vaping Use   Vaping Use: Never used  Substance Use Topics   Alcohol use: No   Drug use: No     Family History: The patient's family history includes Lung cancer (age of onset: 87) in her mother; Prostate cancer (age of onset:  77) in her brother. There is no history of Bladder Cancer or Kidney cancer.  ROS:   Please see the history of present illness.    All other systems reviewed and are negative.  EKGs/Labs/Other Studies Reviewed:    The following studies were reviewed today:  EKG: SR, PACs, low voltage QRS  I have independently reviewed the images from echocardiogram 03/20/21 .  Recent Labs: 05/03/2020: Magnesium 2.0 08/29/2020: ALT 24; BUN 17; Creat 0.88; Potassium 5.1; Sodium 139 12/27/2020: TSH 2.120 02/13/2021: Hemoglobin 11.6; Platelets 237.0  Recent Lipid Panel    Component Value Date/Time   CHOL 181 02/27/2020 0932   TRIG 159.0 (H) 02/27/2020 0932   HDL 38.60 (L) 02/27/2020 0932   CHOLHDL 5 02/27/2020 0932   VLDL 31.8 02/27/2020 0932   LDLCALC 111 (H) 02/27/2020 0932    Physical Exam:    VS:  BP 110/68 (BP Location: Left Arm, Patient Position: Sitting, Cuff Size: Normal)   Pulse 80   Ht 5' 3"  (1.6 m)   Wt 162 lb 3.2 oz (73.6 kg)   SpO2 97%   BMI 28.73 kg/m     Wt Readings from Last 5 Encounters:  03/25/21 162 lb 3.2 oz (73.6 kg)  02/22/21 161 lb (73 kg)  02/13/21 158 lb 6.1 oz (71.8 kg)  02/05/21 163 lb 3.2 oz (74 kg)   01/29/21 165 lb (74.8 kg)    Constitutional: No acute distress Eyes: sclera non-icteric, normal conjunctiva and lids ENMT: normal dentition, moist mucous membranes Cardiovascular: regular rhythm, normal rate, 2/6 SEM. S1 and S2 normal. Radial pulses normal bilaterally. No jugular venous distention.  Respiratory: clear to auscultation bilaterally GI : normal bowel sounds, soft and nontender. No distention.   MSK: extremities warm, well perfused. No edema.  NEURO: grossly nonfocal exam, moves all extremities. PSYCH: alert and oriented x 3, normal mood and affect.   ASSESSMENT:    1. Pericardial effusion   2. Dyspnea, unspecified type   3. Medication management   4. Palpitations   5. Essential hypertension   6. Anemia, unspecified type   7. Hyperlipidemia, unspecified hyperlipidemia type    PLAN:    Pericardial effusion - Plan: EKG 66-AYTK, Basic metabolic panel, Magnesium, ECHOCARDIOGRAM LIMITED Dyspnea, unspecified type Medication management - Plan: Basic metabolic panel, Magnesium - suspect it is post-viral but unclear why protracted resolution of effusion. Recheck echo for any progression in 4-6 weeks. She would like to try diuretic therapy. We discussed that this may not be helpful given no other signs of heart failure and hyperdynamic ventricle which if underfilled may worsen symptoms. However, it is a reasonable pursuit if it helps her bendopnea. No features of active pericarditis or chronic pericarditis by labs or echos.  Palpitations - previously recommended BB, improved with iron therapy, no worrisome findings on monitor, consider BB if symptoms recur.   Essential hypertension - bp low, observe currently.   Anemia, unspecified type - ,managed by Heme/Onc with iron supplementation.  Total time of encounter: 30 minutes total time of encounter, including 25 minutes spent in face-to-face patient care on the date of this encounter. This time includes coordination of care and  counseling regarding above mentioned problem list. Remainder of non-face-to-face time involved reviewing chart documents/testing relevant to the patient encounter and documentation in the medical record. I have independently reviewed documentation from referring provider.   [name], MD, [facility]     Medication Adjustments/Labs and Tests Ordered: Current medicines are reviewed at length with  the patient today.  Concerns regarding medicines are outlined above.   Orders Placed This Encounter  Procedures   Basic metabolic panel   Magnesium   EKG 12-Lead   ECHOCARDIOGRAM LIMITED    Meds ordered this encounter  Medications   furosemide (LASIX) 20 MG tablet    Sig: Take 1 tablet (20 mg total) by mouth daily. TAKE 1 TABLET BY MOUTH DAILY FOR 14 DAYS.    Dispense:  14 tablet    Refill:  0    Patient Instructions  Medication Instructions:  START: LASIX 36m DAILY FOR 14 DAYS. PLEASE CALL [place] LET [place] HOW YOUR DOING IN 14 DAYS  *If you need a refill on your cardiac medications before your next appointment, please call your pharmacy*  Lab Work: BMET & [place] IS 7-10 DAYS  If you have labs (blood work) drawn today and your tests are completely normal, you will receive your results only by: [place](if you have MyChart) OR A paper copy in the mail If you have any lab test that is abnormal or we need to change your treatment, we will call you to review the results.  Testing/Procedures: Your physician has requested that you have an echocardiogram in 6 weeks. Echocardiography is a painless test that uses sound waves to create images of your heart. It provides your doctor with information about the size and shape of your heart and how well your heart's chambers and valves are working. You may receive an ultrasound enhancing agent through an IV if needed to better visualize your heart during the echo.This procedure takes  approximately one hour. There are no restrictions for this procedure. This will take place at the [address].   Follow-Up: [facility] and your [facility] are our [facility].  As part of our continuing mission to provide you with exceptional heart care, we have created designated Provider Care Teams.  These Care Teams include your primary Cardiologist (physician) and Advanced Practice Providers (APPs -  Physician Assistants and Nurse Practitioners) who all work together to provide you with the care you need, when you need it.  Your next appointment:   6 week(s)  The format for your next appointment:   In Person  Provider:   You may see Dr. AMargaretann Lovelessor one of the following Advanced Practice Providers on your designated Care Team:   RRosaria Ferries PA-C KJory Sims DNP, ANP ANY APP

## 2021-03-26 DIAGNOSIS — R0789 Other chest pain: Secondary | ICD-10-CM | POA: Diagnosis not present

## 2021-03-26 DIAGNOSIS — R5383 Other fatigue: Secondary | ICD-10-CM | POA: Diagnosis not present

## 2021-03-26 DIAGNOSIS — J90 Pleural effusion, not elsewhere classified: Secondary | ICD-10-CM | POA: Diagnosis not present

## 2021-03-26 DIAGNOSIS — C7901 Secondary malignant neoplasm of right kidney and renal pelvis: Secondary | ICD-10-CM | POA: Diagnosis not present

## 2021-03-26 DIAGNOSIS — Z923 Personal history of irradiation: Secondary | ICD-10-CM | POA: Diagnosis not present

## 2021-03-26 DIAGNOSIS — C37 Malignant neoplasm of thymus: Secondary | ICD-10-CM | POA: Diagnosis not present

## 2021-03-26 DIAGNOSIS — Z9221 Personal history of antineoplastic chemotherapy: Secondary | ICD-10-CM | POA: Diagnosis not present

## 2021-03-26 DIAGNOSIS — R1031 Right lower quadrant pain: Secondary | ICD-10-CM | POA: Diagnosis not present

## 2021-03-26 DIAGNOSIS — Z9010 Acquired absence of unspecified breast and nipple: Secondary | ICD-10-CM | POA: Diagnosis not present

## 2021-03-26 DIAGNOSIS — I313 Pericardial effusion (noninflammatory): Secondary | ICD-10-CM | POA: Diagnosis not present

## 2021-03-26 DIAGNOSIS — R0600 Dyspnea, unspecified: Secondary | ICD-10-CM | POA: Diagnosis not present

## 2021-03-26 DIAGNOSIS — Z853 Personal history of malignant neoplasm of breast: Secondary | ICD-10-CM | POA: Diagnosis not present

## 2021-03-26 DIAGNOSIS — D509 Iron deficiency anemia, unspecified: Secondary | ICD-10-CM | POA: Diagnosis not present

## 2021-03-27 ENCOUNTER — Other Ambulatory Visit: Payer: Self-pay

## 2021-03-27 ENCOUNTER — Encounter: Payer: Self-pay | Admitting: Pulmonary Disease

## 2021-03-27 ENCOUNTER — Ambulatory Visit (INDEPENDENT_AMBULATORY_CARE_PROVIDER_SITE_OTHER): Payer: Medicare Other | Admitting: Pulmonary Disease

## 2021-03-27 VITALS — BP 120/68 | HR 78 | Temp 97.9°F | Ht 63.0 in | Wt 159.4 lb

## 2021-03-27 DIAGNOSIS — J9859 Other diseases of mediastinum, not elsewhere classified: Secondary | ICD-10-CM | POA: Diagnosis not present

## 2021-03-27 MED ORDER — GABAPENTIN 300 MG PO CAPS: 300.0000 mg | ORAL_CAPSULE | Freq: Two times a day (BID) | ORAL | 0 refills | Status: DC

## 2021-03-27 NOTE — Patient Instructions (Signed)
Neurontin for neuropathic pain  300 mg daily for 1 week Then 300 mg twice a day May increase to 300 mg 3 times a day  Dose can be much higher but it depends on tolerability  We will repeat your CT scan in 3 months  I will see you 3 months from now  If you have any significant side effect from the Neurontin, just stop the medication  Gabapentin capsules or tablets What is this medicine? GABAPENTIN (GA ba pen tin) is used to control seizures in certain types of epilepsy. It is also used to treat certain types of nerve pain. This medicine may be used for other purposes; ask your health care provider or pharmacist if you have questions. COMMON BRAND NAME(S): Active-PAC with Gabapentin, Orpha Bur, [place], Neurontin What should I tell my health care provider before I take this medicine? They need to know if you have any of these conditions:  history of drug abuse or alcohol abuse problem  kidney disease  lung or breathing disease  suicidal thoughts, plans, or attempt; a previous suicide attempt by you or a family member  an unusual or allergic reaction to gabapentin, other medicines, foods, dyes, or preservatives  pregnant or trying to get pregnant  breast-feeding How should I use this medicine? Take this medicine by mouth with a glass of water. Follow the directions on the prescription label. You can take it with or without food. If it upsets your stomach, take it with food. Take your medicine at regular intervals. Do not take it more often than directed. Do not stop taking except on your doctor's advice. If you are directed to break the 600 or 800 mg tablets in half as part of your dose, the extra half tablet should be used for the next dose. If you have not used the extra half tablet within 28 days, it should be thrown away. A special MedGuide will be given to you by the pharmacist with each prescription and refill. Be sure to read this information carefully each time. Talk to  your pediatrician regarding the use of this medicine in children. While this drug may be prescribed for children as young as 3 years for selected conditions, precautions do apply. Overdosage: If you think you have taken too much of this medicine contact a poison control center or emergency room at once. NOTE: This medicine is only for you. Do not share this medicine with others. What if I miss a dose? If you miss a dose, take it as soon as you can. If it is almost time for your next dose, take only that dose. Do not take double or extra doses. What may interact with this medicine? This medicine may interact with the following medications:  alcohol  antihistamines for allergy, cough, and cold  certain medicines for anxiety or sleep  certain medicines for depression like amitriptyline, fluoxetine, sertraline  certain medicines for seizures like phenobarbital, primidone  certain medicines for stomach problems  general anesthetics like halothane, isoflurane, methoxyflurane, propofol  local anesthetics like lidocaine, pramoxine, tetracaine  medicines that relax muscles for surgery  narcotic medicines for pain  phenothiazines like chlorpromazine, mesoridazine, prochlorperazine, thioridazine This list may not describe all possible interactions. Give your health care provider a list of all the medicines, herbs, non-prescription drugs, or dietary supplements you use. Also tell them if you smoke, drink alcohol, or use illegal drugs. Some items may interact with your medicine. What should I watch for while using this medicine? Visit your doctor  or health care provider for regular checks on your progress. You may want to keep a record at home of how you feel your condition is responding to treatment. You may want to share this information with your doctor or health care provider at each visit. You should contact your doctor or health care provider if your seizures get worse or if you have any new  types of seizures. Do not stop taking this medicine or any of your seizure medicines unless instructed by your doctor or health care provider. Stopping your medicine suddenly can increase your seizures or their severity. This medicine may cause serious skin reactions. They can happen weeks to months after starting the medicine. Contact your health care provider right away if you notice fevers or flu-like symptoms with a rash. The rash may be red or purple and then turn into blisters or peeling of the skin. Or, you might notice a red rash with swelling of the face, lips or lymph nodes in your neck or under your arms. Wear a medical identification bracelet or chain if you are taking this medicine for seizures, and carry a card that lists all your medications. You may get drowsy, dizzy, or have blurred vision. Do not drive, use machinery, or do anything that needs mental alertness until you know how this medicine affects you. To reduce dizzy or fainting spells, do not sit or stand up quickly, especially if you are an older patient. Alcohol can increase drowsiness and dizziness. Avoid alcoholic drinks. Your mouth may get dry. Chewing sugarless gum or sucking hard candy, and drinking plenty of water will help. The use of this medicine may increase the chance of suicidal thoughts or actions. Pay special attention to how you are responding while on this medicine. Any worsening of mood, or thoughts of suicide or dying should be reported to your health care provider right away. Women who become pregnant while using this medicine may enroll in the [place] Pregnancy Registry by calling [phone]. This registry collects information about the safety of antiepileptic drug use during pregnancy. What side effects may I notice from receiving this medicine? Side effects that you should report to your doctor or health care professional as soon as possible:  allergic reactions like skin rash,  itching or hives, swelling of the face, lips, or tongue  breathing problems  rash, fever, and swollen lymph nodes  redness, blistering, peeling or loosening of the skin, including inside the mouth  suicidal thoughts, mood changes Side effects that usually do not require medical attention (report to your doctor or health care professional if they continue or are bothersome):  dizziness  drowsiness  headache  nausea, vomiting  swelling of ankles, feet, hands  tiredness This list may not describe all possible side effects. Call your doctor for medical advice about side effects. You may report side effects to FDA at 1-800-FDA-1088. Where should I keep my medicine? Keep out of reach of children. This medicine may cause accidental overdose and death if it taken by other adults, children, or pets. Mix any unused medicine with a substance like cat litter or coffee grounds. Then throw the medicine away in a sealed container like a sealed bag or a coffee can with a lid. Do not use the medicine after the expiration date. Store at room temperature between 15 and 30 degrees C (59 and 86 degrees F). NOTE: This sheet is a summary. It may not cover all possible information. If you have questions about  this medicine, talk to your doctor, pharmacist, or health care provider.  2021 Elsevier/Gold Standard (2019-01-07 14:16:43)

## 2021-03-27 NOTE — Addendum Note (Signed)
Addended by: Geanie Logan on: 03/27/2021 11:35 AM   Modules accepted: Orders

## 2021-03-27 NOTE — Progress Notes (Signed)
Autumn Conley    [id number]    29-Oct-1945  Primary Care Physician:Worley, [name], [place]  Referring Physician: [name], [place],   [id number]  Chief complaint:   Shortness of breath of about 2 to 3 months duration Breathing remains about the same since her last visit  HPI:  Still has chest discomfort  PET scan was reviewed with the patient -PET scan did show improvement in SUV measurements -Findings likely related to scarring, radiation fibrosis  Chest discomfort is limiting  Was recently hospitalized and had endoscopy performed in the work-up of iron deficiency anemia-negative findings.  She is short of breath with mild to moderate exertion Shortness of breath with walking up steps  Never smoker No history of alcohol use No occupational predisposition to lung disease  History of mastectomy for breast cancer  Denies significant arthritic pain or discomfort History of hypertension, hypercholesterolemia, sleep apnea  Has not been able to use a CPAP regularly only of recent  She does have an occasional cough History of anxiety  A good component of deconditioning may be contributing to symptoms are present as she has not been very active   Outpatient Encounter Medications as of 03/27/2021  Medication Sig  . ALPRAZolam (XANAX) 0.25 MG tablet Take 0.5 tablets (0.125 mg total) by mouth daily as needed for anxiety.  . Ascorbic Acid (VITAMIN C) 1000 MG tablet Take 1,000 mg by mouth daily.  Marland Kitchen atorvastatin (LIPITOR) 40 MG tablet TAKE 1 TABLET BY MOUTH AT  BEDTIME  . AZO-CRANBERRY PO Take by mouth 2 (two) times daily. 1 tab twice daily  . butalbital-acetaminophen-caffeine (FIORICET) 50-325-40 MG tablet TAKE 1 TO 2 TABLETS BY  MOUTH EVERY 6 HOURS AS  NEEDED FOR HEADACHE(S) .  MANUFACTURER RECOMMENDS NOT EXCEEDING 6 TABLETS/DAY  . Cholecalciferol (VITAMIN D-3 PO) Take 1 capsule by mouth daily.  . cyanocobalamin (,VITAMIN B-12,) 1000  MCG/ML injection Inject 1 mL (1,000 mcg total) into the muscle every 30 (thirty) days.  Marland Kitchen docusate sodium (COLACE) 100 MG capsule Take 100 mg by mouth 2 (two) times daily. Pt isn't sure if she is taking 50 mg or 100 mg bid  . estradiol (ESTRACE) 0.1 MG/GM vaginal cream Estrogen Cream Instruction Discard applicator Apply pea sized amount to tip of finger to urethra before bed. Wash hands well after application. Use Monday, Wednesday and Friday  . furosemide (LASIX) 20 MG tablet Take 1 tablet (20 mg total) by mouth daily. TAKE 1 TABLET BY MOUTH DAILY FOR 14 DAYS.  Marland Kitchen irbesartan (AVAPRO) 150 MG tablet TAKE 1 TABLET BY MOUTH  DAILY  . ondansetron (ZOFRAN) 8 MG tablet Take 8 mg by mouth every 8 (eight) hours as needed for nausea or vomiting.  . pantoprazole (PROTONIX) 40 MG tablet Take 1 tablet (40 mg total) by mouth daily before breakfast.  . PARoxetine (PAXIL) 20 MG tablet TAKE 3 TABLETS BY MOUTH IN  THE MORNING  . polyethylene glycol powder (GLYCOLAX/MIRALAX) 17 GM/SCOOP powder Take 17 g by mouth daily as needed for mild constipation.  . SUMAtriptan (IMITREX) 100 MG tablet Take 1 tablet (100 mg total) by mouth 2 (two) times daily as needed. At least 2 hrs between doses as needed bid  . valACYclovir (VALTREX) 1000 MG tablet Take two tablets daily x 5 days. Then for future outbreaks, take 2 tablets by mouth every 12 hours for 1 day total  . vitamin E 180 MG (400 UNITS) capsule Take 400 Units  by mouth daily.   No facility-administered encounter medications on file as of 03/27/2021.    Allergies as of 03/27/2021 - Review Complete 03/27/2021  Allergen Reaction Noted  . Latex Rash and Other (See Comments) 05/01/2020    Past Medical History:  Diagnosis Date  . Anemia   . Anxiety   . Breast cancer ([place])   . Depression   . Dyspnea   . Fibromyalgia    "some; not chronic" (11/29/2014)  . GERD (gastroesophageal reflux disease)   . Glaucoma of both eyes   . Headache   . Hypertension   . IBS (irritable  bowel syndrome)   . Mitral valve prolapse   . NASH (nonalcoholic steatohepatitis)   . Osteoarthritis   . PONV (postoperative nausea and vomiting)   . Stroke ([place])   . 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 ABDOMINAL HYSTERECTOMY  1980  . APPENDECTOMY  1953  . BIOPSY  05/03/2020   Procedure: BIOPSY;  Surgeon: Gatha Mayer, MD;  Location: [place];  Service: Endoscopy;;  . BREAST BIOPSY Left   . BREAST LUMPECTOMY Left   . CATARACT EXTRACTION, BILATERAL  2018  . COLONOSCOPY WITH PROPOFOL N/A 05/03/2020   Procedure: COLONOSCOPY WITH PROPOFOL;  Surgeon: Gatha Mayer, MD;  Location[facility] ENDOSCOPY;  Service: Endoscopy;  Laterality: N/A;  . ESOPHAGOGASTRODUODENOSCOPY (EGD) WITH PROPOFOL N/A 05/03/2020   Procedure: ESOPHAGOGASTRODUODENOSCOPY (EGD) WITH PROPOFOL;  Surgeon: Gatha Mayer, MD;  Location: [place];  Service: Endoscopy;  Laterality: N/A;  . MASTECTOMY Left ~ 2009  . PARASTERNAL EXPLORATION Right 02/14/2019   Procedure: PARASTERNAL MEDIAL EXPLORATION WITH BIOPIES.;  Surgeon: [name], MD;  Location: [place];  Service: Thoracic;  Laterality: Right;  . POLYPECTOMY  05/03/2020   Procedure: POLYPECTOMY;  Surgeon: Gatha Mayer, MD;  Location: [facility] ENDOSCOPY;  Service: Endoscopy;;    Family History  Problem Relation Age of Onset  . Lung cancer Mother 55  . Prostate cancer Brother 61  . Bladder Cancer Neg Hx   . Kidney cancer Neg Hx     Social History   Socioeconomic History  . Marital status: Married    Spouse name: Not on file  . Number of children: 1  . Years of education: 2  . Highest education level: Not on file  Occupational History  . Occupation: Retired  Tobacco Use  . Smoking status: Never Smoker  . Smokeless tobacco: Never Used  Vaping Use  . Vaping Use: Never used  Substance and Sexual Activity  . Alcohol use: No  . Drug use: No  . Sexual activity: Not Currently  Other Topics Concern  . Not on  file  Social History Narrative   Lives at home with her husband.   Right-handed.   2 cups caffeine/day.   Social Determinants of Health   Financial Resource Strain: Not on file  Food Insecurity: Not on file  Transportation Needs: Not on file  Physical Activity: Not on file  Stress: Not on file  Social Connections: Not on file  Intimate Partner Violence: Not on file    Review of Systems  Constitutional: Positive for fatigue.  Respiratory: Positive for shortness of breath. Negative for cough.   Psychiatric/Behavioral: Positive for sleep disturbance.    Vitals:   03/27/21 0946  BP: 120/68  Pulse: 78  Temp: 97.9 F (36.6 C)  SpO2: 96%     Physical Exam Constitutional:      Appearance: Normal  appearance.  HENT:     Head: Normocephalic and atraumatic.     Nose: No congestion.     Mouth/Throat:     Mouth: Mucous membranes are moist.  Eyes:     General:        Right eye: No discharge.        Left eye: No discharge.  Cardiovascular:     Rate and Rhythm: Normal rate and regular rhythm.     Heart sounds: No murmur heard. No friction rub.  Pulmonary:     Effort: No respiratory distress.     Breath sounds: No stridor. No wheezing or rhonchi.  Musculoskeletal:     Cervical back: No rigidity or tenderness.  Neurological:     Mental Status: She is alert.  Psychiatric:        Mood and Affect: Mood normal.    Data Reviewed: Echocardiogram -Showing moderate pericardial effusion, diastolic dysfunction, mitral stenosis  CT scan from July 2021 was reviewed showing anterior mediastinal mass CT scan chest from 12/25/2020 showing fibrosis, bronchiectasis, evolution of radiation fibrosis  Pulmonary function test reviewed showing no obstruction, no significant bronchodilator response mild restriction, Mild decrease in diffusing capacity  PET scan-moderate pericardial effusion Low-grade activity noted in the right upper lobe, right middle lobe-site of previous thymic  tumor  PET scan film was reviewed with the patient and family members  Assessment:  Shortness of breath -Shortness of breath is multifactorial -Fibrosis, bronchiectasis on CT -Deconditioning -Pericardial effusion  Mild restriction on pulmonary function test  Neuropathic pain  Plan/Recommendations: .  Encouraged regarding graded exercises as tolerated  .  We will follow-up on the CT scan of the chest scheduled for October  .  She continues to follow-up with cardiology with echocardiogram regarding the pericardial effusion  .  I will see her back in about 3 months  .  Prescription for Neurontin will be started at 300 mg daily for 1 week then 300 twice daily with option to increase dose as needed  I spent 30 minutes dedicated to the care of this patient on the date of this encounter to include previsit review of records, face-to-face time with the patient discussing conditions above, post visit ordering of testing, clinical documentation with electronic health record and communicated necessary findings to members of the patient's care team  Sherrilyn Rist MD [facility] 03/27/2021, 10:12 AM  CC: [name], [place]

## 2021-04-09 DIAGNOSIS — Z79899 Other long term (current) drug therapy: Secondary | ICD-10-CM | POA: Diagnosis not present

## 2021-04-09 DIAGNOSIS — I313 Pericardial effusion (noninflammatory): Secondary | ICD-10-CM | POA: Diagnosis not present

## 2021-04-09 LAB — BASIC METABOLIC PANEL
BUN/Creatinine Ratio: 19 (ref 12–28)
BUN: 14 mg/dL (ref 8–27)
CO2: 22 mmol/L (ref 20–29)
Calcium: 9.7 mg/dL (ref 8.7–10.3)
Chloride: 103 mmol/L (ref 96–106)
Creatinine, Ser: 0.75 mg/dL (ref 0.57–1.00)
Glucose: 119 mg/dL — ABNORMAL HIGH (ref 65–99)
Potassium: 3.9 mmol/L (ref 3.5–5.2)
Sodium: 139 mmol/L (ref 134–144)
eGFR: 83 mL/min/{1.73_m2} (ref >59)

## 2021-04-09 LAB — MAGNESIUM: Magnesium: 1.9 mg/dL (ref 1.6–2.3)

## 2021-04-18 ENCOUNTER — Inpatient Hospital Stay (HOSPITAL_COMMUNITY): Payer: Medicare Other

## 2021-04-18 ENCOUNTER — Inpatient Hospital Stay (HOSPITAL_COMMUNITY)
Admission: EM | Admit: 2021-04-18 | Discharge: 2021-04-25 | DRG: 270 | Disposition: A | Discharge status: Home / Self Care | Payer: Medicare Other | Attending: Internal Medicine | Admitting: Internal Medicine

## 2021-04-18 ENCOUNTER — Telehealth: Payer: Self-pay | Admitting: Internal Medicine

## 2021-04-18 ENCOUNTER — Emergency Department (HOSPITAL_COMMUNITY): Payer: Medicare Other

## 2021-04-18 ENCOUNTER — Encounter (HOSPITAL_COMMUNITY): Payer: Self-pay | Admitting: Emergency Medicine

## 2021-04-18 DIAGNOSIS — E861 Hypovolemia: Secondary | ICD-10-CM | POA: Diagnosis present

## 2021-04-18 DIAGNOSIS — J90 Pleural effusion, not elsewhere classified: Secondary | ICD-10-CM

## 2021-04-18 DIAGNOSIS — I313 Pericardial effusion (noninflammatory): Secondary | ICD-10-CM

## 2021-04-18 DIAGNOSIS — J9811 Atelectasis: Secondary | ICD-10-CM | POA: Diagnosis not present

## 2021-04-18 DIAGNOSIS — K7581 Nonalcoholic steatohepatitis (NASH): Secondary | ICD-10-CM | POA: Diagnosis present

## 2021-04-18 DIAGNOSIS — J8410 Pulmonary fibrosis, unspecified: Secondary | ICD-10-CM | POA: Diagnosis present

## 2021-04-18 DIAGNOSIS — J984 Other disorders of lung: Secondary | ICD-10-CM | POA: Diagnosis not present

## 2021-04-18 DIAGNOSIS — C8350 Lymphoblastic (diffuse) lymphoma, unspecified site: Secondary | ICD-10-CM | POA: Diagnosis not present

## 2021-04-18 DIAGNOSIS — R55 Syncope and collapse: Secondary | ICD-10-CM | POA: Diagnosis present

## 2021-04-18 DIAGNOSIS — Z923 Personal history of irradiation: Secondary | ICD-10-CM

## 2021-04-18 DIAGNOSIS — F32A Depression, unspecified: Secondary | ICD-10-CM | POA: Diagnosis not present

## 2021-04-18 DIAGNOSIS — F419 Anxiety disorder, unspecified: Secondary | ICD-10-CM | POA: Diagnosis present

## 2021-04-18 DIAGNOSIS — I10 Essential (primary) hypertension: Secondary | ICD-10-CM

## 2021-04-18 DIAGNOSIS — J939 Pneumothorax, unspecified: Secondary | ICD-10-CM

## 2021-04-18 DIAGNOSIS — I083 Combined rheumatic disorders of mitral, aortic and tricuspid valves: Secondary | ICD-10-CM | POA: Diagnosis not present

## 2021-04-18 DIAGNOSIS — Z20822 Contact with and (suspected) exposure to covid-19: Secondary | ICD-10-CM | POA: Diagnosis present

## 2021-04-18 DIAGNOSIS — K580 Irritable bowel syndrome with diarrhea: Secondary | ICD-10-CM | POA: Diagnosis present

## 2021-04-18 DIAGNOSIS — Z801 Family history of malignant neoplasm of trachea, bronchus and lung: Secondary | ICD-10-CM

## 2021-04-18 DIAGNOSIS — K449 Diaphragmatic hernia without obstruction or gangrene: Secondary | ICD-10-CM | POA: Diagnosis present

## 2021-04-18 DIAGNOSIS — K219 Gastro-esophageal reflux disease without esophagitis: Secondary | ICD-10-CM | POA: Diagnosis present

## 2021-04-18 DIAGNOSIS — S2241XA Multiple fractures of ribs, right side, initial encounter for closed fracture: Secondary | ICD-10-CM | POA: Diagnosis not present

## 2021-04-18 DIAGNOSIS — Z85238 Personal history of other malignant neoplasm of thymus: Secondary | ICD-10-CM | POA: Diagnosis not present

## 2021-04-18 DIAGNOSIS — I700 Atherosclerosis of aorta: Secondary | ICD-10-CM | POA: Diagnosis present

## 2021-04-18 DIAGNOSIS — Z09 Encounter for follow-up examination after completed treatment for conditions other than malignant neoplasm: Secondary | ICD-10-CM

## 2021-04-18 DIAGNOSIS — R911 Solitary pulmonary nodule: Secondary | ICD-10-CM | POA: Diagnosis not present

## 2021-04-18 DIAGNOSIS — E871 Hypo-osmolality and hyponatremia: Secondary | ICD-10-CM | POA: Diagnosis present

## 2021-04-18 DIAGNOSIS — Z8042 Family history of malignant neoplasm of prostate: Secondary | ICD-10-CM

## 2021-04-18 DIAGNOSIS — N179 Acute kidney failure, unspecified: Secondary | ICD-10-CM | POA: Diagnosis not present

## 2021-04-18 DIAGNOSIS — I350 Nonrheumatic aortic (valve) stenosis: Secondary | ICD-10-CM | POA: Diagnosis not present

## 2021-04-18 DIAGNOSIS — E785 Hyperlipidemia, unspecified: Secondary | ICD-10-CM | POA: Diagnosis present

## 2021-04-18 DIAGNOSIS — Z9221 Personal history of antineoplastic chemotherapy: Secondary | ICD-10-CM

## 2021-04-18 DIAGNOSIS — I110 Hypertensive heart disease with heart failure: Secondary | ICD-10-CM | POA: Diagnosis present

## 2021-04-18 DIAGNOSIS — D509 Iron deficiency anemia, unspecified: Secondary | ICD-10-CM | POA: Diagnosis not present

## 2021-04-18 DIAGNOSIS — Z91040 Latex allergy status: Secondary | ICD-10-CM

## 2021-04-18 DIAGNOSIS — M797 Fibromyalgia: Secondary | ICD-10-CM | POA: Diagnosis present

## 2021-04-18 DIAGNOSIS — R0602 Shortness of breath: Secondary | ICD-10-CM

## 2021-04-18 DIAGNOSIS — I517 Cardiomegaly: Secondary | ICD-10-CM | POA: Diagnosis not present

## 2021-04-18 DIAGNOSIS — I3139 Other pericardial effusion (noninflammatory): Secondary | ICD-10-CM

## 2021-04-18 DIAGNOSIS — Z8673 Personal history of transient ischemic attack (TIA), and cerebral infarction without residual deficits: Secondary | ICD-10-CM | POA: Diagnosis not present

## 2021-04-18 DIAGNOSIS — Z9841 Cataract extraction status, right eye: Secondary | ICD-10-CM

## 2021-04-18 DIAGNOSIS — Z90710 Acquired absence of both cervix and uterus: Secondary | ICD-10-CM

## 2021-04-18 DIAGNOSIS — I5033 Acute on chronic diastolic (congestive) heart failure: Secondary | ICD-10-CM

## 2021-04-18 DIAGNOSIS — Z452 Encounter for adjustment and management of vascular access device: Secondary | ICD-10-CM | POA: Diagnosis not present

## 2021-04-18 DIAGNOSIS — Z9842 Cataract extraction status, left eye: Secondary | ICD-10-CM

## 2021-04-18 DIAGNOSIS — I509 Heart failure, unspecified: Secondary | ICD-10-CM

## 2021-04-18 DIAGNOSIS — Z9012 Acquired absence of left breast and nipple: Secondary | ICD-10-CM

## 2021-04-18 DIAGNOSIS — Z853 Personal history of malignant neoplasm of breast: Secondary | ICD-10-CM

## 2021-04-18 DIAGNOSIS — G4733 Obstructive sleep apnea (adult) (pediatric): Secondary | ICD-10-CM | POA: Diagnosis present

## 2021-04-18 DIAGNOSIS — Z79899 Other long term (current) drug therapy: Secondary | ICD-10-CM

## 2021-04-18 DIAGNOSIS — Z48813 Encounter for surgical aftercare following surgery on the respiratory system: Secondary | ICD-10-CM | POA: Diagnosis not present

## 2021-04-18 DIAGNOSIS — J948 Other specified pleural conditions: Secondary | ICD-10-CM | POA: Diagnosis not present

## 2021-04-18 DIAGNOSIS — Z95828 Presence of other vascular implants and grafts: Secondary | ICD-10-CM

## 2021-04-18 LAB — CBC WITH DIFFERENTIAL/PLATELET
Abs Immature Granulocytes: 0.01 10*3/uL (ref 0.00–0.07)
Basophils Absolute: 0.1 10*3/uL (ref 0.0–0.1)
Basophils Relative: 1 %
Eosinophils Absolute: 0.1 10*3/uL (ref 0.0–0.5)
Eosinophils Relative: 1 %
HCT: 32.6 % — ABNORMAL LOW (ref 36.0–46.0)
Hemoglobin: 10.7 g/dL — ABNORMAL LOW (ref 12.0–15.0)
Immature Granulocytes: 0 %
Lymphocytes Relative: 9 %
Lymphs Abs: 0.5 10*3/uL — ABNORMAL LOW (ref 0.7–4.0)
MCH: 31.5 pg (ref 26.0–34.0)
MCHC: 32.8 g/dL (ref 30.0–36.0)
MCV: 95.9 fL (ref 80.0–100.0)
Monocytes Absolute: 0.4 10*3/uL (ref 0.1–1.0)
Monocytes Relative: 7 %
Neutro Abs: 4.5 10*3/uL (ref 1.7–7.7)
Neutrophils Relative %: 82 %
Platelets: 235 10*3/uL (ref 150–400)
RBC: 3.4 MIL/uL — ABNORMAL LOW (ref 3.87–5.11)
RDW: 14.9 % (ref 11.5–15.5)
WBC: 5.6 10*3/uL (ref 4.0–10.5)
nRBC: 0 % (ref 0.0–0.2)

## 2021-04-18 LAB — ECHOCARDIOGRAM COMPLETE
AR max vel: 1.66 cm2
AV Area VTI: 1.71 cm2
AV Area mean vel: 1.64 cm2
AV Mean grad: 10 mmHg
AV Peak grad: 17 mmHg
Ao pk vel: 2.06 m/s
Area-P 1/2: 3.53 cm2
Calc EF: 65 %
Height: 63 in
P 1/2 time: 472 msec
S' Lateral: 2.1 cm
Single Plane A2C EF: 63 %
Single Plane A4C EF: 68.5 %
Weight: 2550.28 oz

## 2021-04-18 LAB — BASIC METABOLIC PANEL
Anion gap: 6 (ref 5–15)
BUN: 13 mg/dL (ref 8–23)
CO2: 25 mmol/L (ref 22–32)
Calcium: 9.1 mg/dL (ref 8.9–10.3)
Chloride: 106 mmol/L (ref 98–111)
Creatinine, Ser: 0.76 mg/dL (ref 0.44–1.00)
GFR, Estimated: >60 mL/min (ref >60)
Glucose, Bld: 107 mg/dL — ABNORMAL HIGH (ref 70–99)
Potassium: 4.1 mmol/L (ref 3.5–5.1)
Sodium: 137 mmol/L (ref 135–145)

## 2021-04-18 LAB — RESP PANEL BY RT-PCR (FLU A&B, COVID) ARPGX2
Influenza A by PCR: NEGATIVE
Influenza B by PCR: NEGATIVE
SARS Coronavirus 2 by RT PCR: NEGATIVE

## 2021-04-18 LAB — BRAIN NATRIURETIC PEPTIDE: B Natriuretic Peptide: 163.9 pg/mL — ABNORMAL HIGH (ref 0.0–100.0)

## 2021-04-18 LAB — TROPONIN I (HIGH SENSITIVITY): Troponin I (High Sensitivity): 9 ng/L (ref <18)

## 2021-04-18 MED ORDER — HYDRALAZINE HCL 25 MG PO TABS: 25.0000 mg | ORAL_TABLET | Freq: Four times a day (QID) | ORAL | Status: DC | PRN

## 2021-04-18 MED ORDER — FUROSEMIDE 10 MG/ML IJ SOLN
40.0000 mg | Freq: Once | INTRAMUSCULAR | Status: DC
  Administered 2021-04-18: 40 mg via INTRAVENOUS
  Filled 2021-04-18: qty 4

## 2021-04-18 MED ORDER — ONDANSETRON HCL 4 MG/2ML IJ SOLN: 4.0000 mg | Freq: Four times a day (QID) | INTRAMUSCULAR | Status: DC | PRN

## 2021-04-18 MED ORDER — PAROXETINE HCL 30 MG PO TABS
60.0000 mg | ORAL_TABLET | Freq: Every day | ORAL | Status: DC
  Administered 2021-04-19 – 2021-04-25 (×7): 60 mg via ORAL
  Filled 2021-04-18 (×8): qty 2

## 2021-04-18 MED ORDER — VITAMIN D 25 MCG (1000 UNIT) PO TABS
5000.0000 [IU] | ORAL_TABLET | Freq: Every day | ORAL | Status: DC
  Administered 2021-04-19 – 2021-04-25 (×7): 5000 [IU] via ORAL
  Filled 2021-04-18 (×8): qty 5

## 2021-04-18 MED ORDER — PANTOPRAZOLE SODIUM 40 MG PO TBEC
40.0000 mg | DELAYED_RELEASE_TABLET | Freq: Every day | ORAL | Status: DC
  Administered 2021-04-19 – 2021-04-25 (×7): 40 mg via ORAL
  Filled 2021-04-18 (×7): qty 1

## 2021-04-18 MED ORDER — ASCORBIC ACID 500 MG PO TABS
1000.0000 mg | ORAL_TABLET | Freq: Every day | ORAL | Status: DC
  Administered 2021-04-19 – 2021-04-25 (×7): 1000 mg via ORAL
  Filled 2021-04-18 (×7): qty 2

## 2021-04-18 MED ORDER — SUMATRIPTAN SUCCINATE 100 MG PO TABS
100.0000 mg | ORAL_TABLET | Freq: Two times a day (BID) | ORAL | Status: DC | PRN
  Filled 2021-04-18: qty 1

## 2021-04-18 MED ORDER — IRBESARTAN 150 MG PO TABS
150.0000 mg | ORAL_TABLET | Freq: Every day | ORAL | Status: DC
  Administered 2021-04-19 – 2021-04-20 (×2): 150 mg via ORAL
  Filled 2021-04-18 (×2): qty 1

## 2021-04-18 MED ORDER — ENOXAPARIN SODIUM 40 MG/0.4ML IJ SOSY
40.0000 mg | PREFILLED_SYRINGE | INTRAMUSCULAR | Status: DC
  Administered 2021-04-19 – 2021-04-24 (×6): 40 mg via SUBCUTANEOUS
  Filled 2021-04-18 (×6): qty 0.4

## 2021-04-18 MED ORDER — POLYETHYLENE GLYCOL 3350 17 GM/SCOOP PO POWD
17.0000 g | Freq: Every day | ORAL | Status: DC | PRN
  Filled 2021-04-18: qty 255

## 2021-04-18 MED ORDER — BUTALBITAL-APAP-CAFFEINE 50-325-40 MG PO TABS
1.0000 | ORAL_TABLET | Freq: Four times a day (QID) | ORAL | Status: DC | PRN
  Administered 2021-04-18 – 2021-04-24 (×5): 1 via ORAL
  Filled 2021-04-18 (×4): qty 1

## 2021-04-18 MED ORDER — DOCUSATE SODIUM 100 MG PO CAPS
100.0000 mg | ORAL_CAPSULE | Freq: Two times a day (BID) | ORAL | Status: DC | PRN
  Administered 2021-04-20: 100 mg via ORAL
  Filled 2021-04-18: qty 1

## 2021-04-18 MED ORDER — SODIUM CHLORIDE 0.9% FLUSH
3.0000 mL | INTRAVENOUS | Status: DC | PRN
  Administered 2021-04-23: 3 mL via INTRAVENOUS

## 2021-04-18 MED ORDER — VITAMIN E 45 MG (100 UNIT) PO CAPS
400.0000 [IU] | ORAL_CAPSULE | Freq: Every day | ORAL | Status: DC
  Administered 2021-04-19 – 2021-04-25 (×7): 400 [IU] via ORAL
  Filled 2021-04-18 (×8): qty 4

## 2021-04-18 MED ORDER — ACETAMINOPHEN 325 MG PO TABS
650.0000 mg | ORAL_TABLET | ORAL | Status: DC | PRN
  Administered 2021-04-18: 650 mg via ORAL
  Filled 2021-04-18: qty 2

## 2021-04-18 MED ORDER — SODIUM CHLORIDE 0.9 % IV SOLN: 250.0000 mL | INTRAVENOUS | Status: DC | PRN

## 2021-04-18 MED ORDER — POLYETHYLENE GLYCOL 3350 17 G PO PACK: 17.0000 g | PACK | Freq: Every day | ORAL | Status: DC | PRN

## 2021-04-18 MED ORDER — CARVEDILOL 3.125 MG PO TABS
3.1250 mg | ORAL_TABLET | Freq: Two times a day (BID) | ORAL | Status: DC
  Administered 2021-04-19 – 2021-04-20 (×2): 3.125 mg via ORAL
  Filled 2021-04-18 (×2): qty 1

## 2021-04-18 MED ORDER — ONDANSETRON HCL 4 MG PO TABS: 8.0000 mg | ORAL_TABLET | Freq: Three times a day (TID) | ORAL | Status: DC | PRN

## 2021-04-18 MED ORDER — ACETAMINOPHEN 325 MG PO TABS
650.0000 mg | ORAL_TABLET | Freq: Once | ORAL | Status: AC
  Administered 2021-04-18: 650 mg via ORAL
  Filled 2021-04-18: qty 2

## 2021-04-18 MED ORDER — SODIUM CHLORIDE 0.9% FLUSH
3.0000 mL | Freq: Two times a day (BID) | INTRAVENOUS | Status: DC
  Administered 2021-04-19 – 2021-04-23 (×7): 3 mL via INTRAVENOUS

## 2021-04-18 MED ORDER — ATORVASTATIN CALCIUM 40 MG PO TABS
40.0000 mg | ORAL_TABLET | Freq: Every day | ORAL | Status: DC
  Administered 2021-04-18 – 2021-04-24 (×7): 40 mg via ORAL
  Filled 2021-04-18 (×7): qty 1

## 2021-04-18 MED ORDER — ALPRAZOLAM 0.25 MG PO TABS
0.1250 mg | ORAL_TABLET | Freq: Every day | ORAL | Status: DC | PRN
  Administered 2021-04-22: 0.125 mg via ORAL
  Filled 2021-04-18: qty 1

## 2021-04-18 MED ORDER — CYANOCOBALAMIN 1000 MCG/ML IJ SOLN
1000.0000 ug | INTRAMUSCULAR | Status: DC
  Filled 2021-04-18: qty 1

## 2021-04-18 NOTE — Consult Note (Addendum)
[address]       [place] [address]             [phone]        Autumn Conley [place] Medical Record #[id number] Date of Birth: 1945-12-04  Referring: No ref. provider found Primary Care: [name], [place] Primary Cardiologist:Gayatri Stann Mainland, MD  Chief Complaint:    Chief Complaint  Patient presents with   Shortness of Breath    History of Present Illness:      [name] is a 75 year old female with a history of malignant thymoma diagnosed in April 2020 and treated with chemotherapy /radiation.  She also has a history of hypertension, mitral valve prolapse, fibromyalgia, and Paget's disease of the left breast with subsequent left mastectomy in 2009.  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  Autumn Conley started having exertional dyspnea earlier this year and was referred to cardiology.  Work-up included an echocardiogram back in April of this year that demonstrated a moderate pericardial effusion.  She has been followed by the cardiology service with repeat echocardiograms every 6 weeks since then.  The most recent echo on June 6 showed a large pericardial effusion that prompted treatment with Lasix.  She was unable to take the prescribed course of Lasix, however, due to diarrhea.  She felt her shortness of breath was worsening and also developed some right-sided chest tightness.  After consulting with her cardiologist office, she was advised to go to the emergency room today.  Work-up today has included a repeat echo that shows the pericardial effusion has enlarged further since the previous study on March 25, 2021.  It now measures about 3 cm and is circumferential.  There was no right ventricular or right atrial collapse during diastole.  Ejection fraction was estimated at 60 to 65%.   There was mild aortic stenosis with an estimated aortic valve area of 1.71 cm.  Mitral valve prolapse was not mentioned although she has a longstanding history of this.  CT scan of the chest confirmed the large pericardial effusion and also showed a large right pleural effusion and small left pleural effusion.  A right upper lobe nodule seen on previous studies was felt to be stable.  She is also shown to have aortic atherosclerosis on chest CT scan.  CT surgery was asked to evaluate Autumn Conley for surgical management of her progressively enlarging pericardial effusion and large right pleural effusion.  Autumn Conley was interviewed this evening with her husband present at the bedside.  She is sitting up on the side of the bed and reports feeling much better since being given a dose of IV Lasix and and also having the supplemental nasal cannula oxygen.  She denies having any chest pain associated with her episodes of shortness of breath.  Her appetite has been good recently and her weight has been stable.  She has never smoked.    Zubrod Score: At the time of surgery this patient's most appropriate activity status/level should be described as: []     0    Normal activity, no symptoms []     1    Restricted in physical strenuous activity but ambulatory, able to do out light work  [x]    2    Ambulatory and capable of self care, unable to do  work activities, up and about                 more than 50%  Of the time                            []     3    Only limited self care, in bed greater than 50% of waking hours []     4    Completely disabled, no self care, confined to bed or chair []     5    Moribund  Past Medical History:  Diagnosis Date   Anemia    Anxiety    Breast cancer ([place])    Depression    Dyspnea    Fibromyalgia    "some; not chronic" (11/29/2014)   GERD (gastroesophageal reflux disease)    Glaucoma of both eyes    Headache    Hypertension    IBS (irritable bowel syndrome)    Mitral  valve prolapse    NASH (nonalcoholic steatohepatitis)    Osteoarthritis    PONV (postoperative nausea and vomiting)    Stroke ([place])    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[facility])     Past Surgical History:  Procedure Laterality Date   [place]   BIOPSY  05/03/2020   Procedure: BIOPSY;  Surgeon: Gatha Mayer, MD;  Location: [facility];  Service: Endoscopy;;   BREAST BIOPSY Left    BREAST LUMPECTOMY Left    CATARACT EXTRACTION, BILATERAL  2018   COLONOSCOPY WITH PROPOFOL N/A 05/03/2020   Procedure: COLONOSCOPY WITH PROPOFOL;  Surgeon: Gatha Mayer, MD;  Location: [facility];  Service: Endoscopy;  Laterality: N/A;   ESOPHAGOGASTRODUODENOSCOPY (EGD) WITH PROPOFOL N/A 05/03/2020   Procedure: ESOPHAGOGASTRODUODENOSCOPY (EGD) WITH PROPOFOL;  Surgeon: Gatha Mayer, MD;  Location: [facility];  Service: Endoscopy;  Laterality: N/A;   MASTECTOMY Left ~ 2009   PARASTERNAL EXPLORATION Right 02/14/2019   Procedure: PARASTERNAL MEDIAL EXPLORATION WITH BIOPIES.;  Surgeon: [name], MD;  Location: [facility];  Service: Thoracic;  Laterality: Right;   POLYPECTOMY  05/03/2020   Procedure: POLYPECTOMY;  Surgeon: Gatha Mayer, MD;  Location: [facility];  Service: Endoscopy;;    Social History   Tobacco Use  Smoking Status Never  Smokeless Tobacco Never    Social History   Substance and Sexual Activity  Alcohol Use No     Allergies  Allergen Reactions   Latex Rash and Other (See Comments)    Blisters, also    Current Facility-Administered Medications  Medication Dose Route Frequency Provider Last Rate Last Admin   0.9 %  sodium chloride infusion  250 mL Intravenous PRN Wynetta Fines T, MD       acetaminophen (TYLENOL) tablet 650 mg  650 mg Oral Q4H PRN Lequita Halt, MD       ALPRAZolam Duanne Moron) tablet 0.125 mg  0.125 mg Oral Daily PRN Wynetta Fines T, MD       ascorbic acid (VITAMIN C) tablet 1,000 mg  1,000 mg Oral Daily Wynetta Fines T, MD        atorvastatin (LIPITOR) tablet 40 mg  40 mg Oral QHS Wynetta Fines T, MD       butalbital-acetaminophen-caffeine (FIORICET) [phone] MG per tablet 1-2 tablet  1-2 tablet Oral Q6H PRN Lequita Halt, MD       [START ON 04/19/2021] cholecalciferol (VITAMIN D3) tablet 5,000 Units  5,000 Units Oral  Daily Lequita Halt, MD       cyanocobalamin ((VITAMIN B-12)) injection 1,000 mcg  1,000 mcg Intramuscular Q30 days Wynetta Fines T, MD       docusate sodium (COLACE) capsule 100 mg  100 mg Oral BID PRN Lequita Halt, MD       enoxaparin (LOVENOX) injection 40 mg  40 mg Subcutaneous Q24H Wynetta Fines T, MD       hydrALAZINE (APRESOLINE) tablet 25 mg  25 mg Oral Q6H PRN Lequita Halt, MD       [START ON 04/19/2021] irbesartan (AVAPRO) tablet 150 mg  150 mg Oral Daily Wynetta Fines T, MD       ondansetron [facility]) injection 4 mg  4 mg Intravenous Q6H PRN Lequita Halt, MD       ondansetron [facility]) tablet 8 mg  8 mg Oral Q8H PRN Lequita Halt, MD       [START ON 04/19/2021] pantoprazole (PROTONIX) EC tablet 40 mg  40 mg Oral QAC breakfast Wynetta Fines T, MD       PARoxetine (PAXIL) tablet 60 mg  60 mg Oral Daily Wynetta Fines T, MD       polyethylene glycol (MIRALAX / GLYCOLAX) packet 17 g  17 g Oral Daily PRN Heloise Purpura, RPH       sodium chloride flush (NS) 0.9 % injection 3 mL  3 mL Intravenous Q12H Wynetta Fines T, MD       sodium chloride flush (NS) 0.9 % injection 3 mL  3 mL Intravenous PRN Wynetta Fines T, MD       SUMAtriptan (IMITREX) tablet 100 mg  100 mg Oral BID PRN Lequita Halt, MD       vitamin E capsule 400 Units  400 Units Oral Daily Lequita Halt, MD        Medications Prior to Admission  Medication Sig Dispense Refill Last Dose   ALPRAZolam (XANAX) 0.25 MG tablet Take 0.5 tablets (0.125 mg total) by mouth daily as needed for anxiety. 30 tablet 0 Past Week   Ascorbic Acid (VITAMIN C) 1000 MG tablet Take 1,000 mg by mouth daily.   04/18/2021   atorvastatin (LIPITOR) 40 MG tablet TAKE 1 TABLET BY MOUTH  AT  BEDTIME (Patient taking differently: Take 40 mg by mouth at bedtime.) 90 tablet 3 04/17/2021 at pm   AZO-CRANBERRY PO Take 1 tablet by mouth 2 (two) times daily.   04/18/2021 at am   butalbital-acetaminophen-caffeine (FIORICET) 50-325-40 MG tablet TAKE 1 TO 2 TABLETS BY  MOUTH EVERY 6 HOURS AS  NEEDED FOR HEADACHE(S) .  MANUFACTURER RECOMMENDS NOT EXCEEDING 6 TABLETS/DAY (Patient taking differently: Take 1-2 tablets by mouth every 6 (six) hours as needed (for headaches- MANUFACTURER RECOMMENDS NOT EXCEEDING 6 TABLETS/DAY).) 20 tablet 1 unk   Cholecalciferol (VITAMIN D-3 PO) Take 1 capsule by mouth daily.   04/18/2021   cyanocobalamin (,VITAMIN B-12,) 1000 MCG/ML injection Inject 1 mL (1,000 mcg total) into the muscle every 30 (thirty) days. 3 mL 2 Past Month   docusate sodium (COLACE) 100 MG capsule Take 100 mg by mouth 2 (two) times daily as needed for mild constipation or moderate constipation.   unk   irbesartan (AVAPRO) 150 MG tablet TAKE 1 TABLET BY MOUTH  DAILY (Patient taking differently: Take 150 mg by mouth in the morning.) 90 tablet 3 04/18/2021 at am   ondansetron (ZOFRAN) 8 MG tablet Take 8 mg by mouth every 8 (eight) hours as needed for  nausea or vomiting.   unk   pantoprazole (PROTONIX) 40 MG tablet Take 1 tablet (40 mg total) by mouth daily before breakfast. 30 tablet 0 04/18/2021 at am   PARoxetine (PAXIL) 20 MG tablet TAKE 3 TABLETS BY MOUTH IN  THE MORNING (Patient taking differently: Take 60 mg by mouth in the morning.) 270 tablet 3 04/18/2021 at am   polyethylene glycol powder (GLYCOLAX/MIRALAX) 17 GM/SCOOP powder Take 17 g by mouth daily as needed for mild constipation.   unk   SUMAtriptan (IMITREX) 100 MG tablet Take 1 tablet (100 mg total) by mouth 2 (two) times daily as needed. At least 2 hrs between doses as needed bid (Patient taking differently: Take 100 mg by mouth 2 (two) times daily as needed for migraine or headache (and allow at least 2 hours between doses).) 30 tablet 1 unk    valACYclovir (VALTREX) 1000 MG tablet Take two tablets daily x 5 days. Then for future outbreaks, take 2 tablets by mouth every 12 hours for 1 day total (Patient taking differently: Take 1,000 mg by mouth See admin instructions. Take 1,000 mg by mouth every twelve hours as directed for outbreaks of cold blisters) 60 tablet 0 04/18/2021 at am   vitamin E 180 MG (400 UNITS) capsule Take 400 Units by mouth daily.   04/18/2021   estradiol (ESTRACE) 0.1 MG/GM vaginal cream Estrogen Cream Instruction Discard applicator Apply pea sized amount to tip of finger to urethra before bed. Wash hands well after application. Use Monday, Wednesday and Friday (Patient not taking: Reported on 04/18/2021) 42.5 g 12 Not Taking   furosemide (LASIX) 20 MG tablet Take 1 tablet (20 mg total) by mouth daily. TAKE 1 TABLET BY MOUTH DAILY FOR 14 DAYS. (Patient not taking: Reported on 04/18/2021) 14 tablet 0 Not Taking    Family History  Problem Relation Age of Onset   Lung cancer Mother 27   Prostate cancer Brother 54   Bladder Cancer Neg Hx    Kidney cancer Neg Hx      Review of Systems:   ROS     Cardiac Review of Systems: Y or  [    ]= no  Chest Pain [    ]  Resting SOB [  x ] Exertional SOB  [ x ]  Orthopnea [ x ]   Pedal Edema [   ]    Palpitations [  ] Syncope  [  ]   Presyncope [   ]  General Review of Systems: [Y] = yes [  ]=no Constitional: recent weight change [  ]; anorexia [  ]; fatigue [  ]; nausea [  ]; night sweats [  ]; fever [  ]    Eye : blurred vision [  ]; diplopia [   ]; vision changes [  ];  Amaurosis fugax[  ]; Resp: cough [  ];  wheezing[  ];  hemoptysis[  ]; shortness of breath[ ] ; paroxysmal nocturnal dyspnea[  ]; dyspnea on exertion[  ]; or orthopnea[  ];  GI:  gallstones[  ], vomiting[  ];  dysphagia[  ]; melena[  ];  hematochezia [  ]; heartburn[  ];   Hx of  Colonoscopy[  ]; GU: kidney stones [  ]; hematuria[  ];   dysuria [  ];  nocturia[  ];  history of     obstruction [  ]; urinary  frequency [  ]  Skin: rash, swelling[  ];, hair loss[  ];  peripheral edema[  ];  or itching[  ]; Musculosketetal: myalgias[  ];  joint swelling[  ];  joint erythema[  ];  joint pain[  ];  back pain[  ];  Heme/Lymph: bruising[  ];  bleeding[  ];  anemia[  ];  Neuro: TIA[  ];  headaches[  ];  stroke[  ];  vertigo[  ];  seizures[  ];   paresthesias[  ];  difficulty walking[  ];  Psych:depression[  ]; anxiety[ ] ;  Endocrine: diabetes[  ];  thyroid dysfunction[  ];           Physical Exam: BP (!) 179/91   Pulse 81   Temp 98 F (36.7 C) (Oral)   Resp 16   Ht 5' 3"  (1.6 m)   Wt 72.3 kg   SpO2 98%   BMI 28.24 kg/m    General appearance: alert, cooperative, and mildly dyspneic while talking Head: Normocephalic, without obvious abnormality, atraumatic Neck: no adenopathy, no carotid bruit, no JVD, and supple, symmetrical, trachea midline Lymph nodes: There is no obvious cervical or clavicular adenopathy Chest: There is a well-healed scar measuring about 7 cm in length overlying the fourth intercostal space and immediately adjacent to the sternum. Resp: Breath sounds are clear in the upper fields bilaterally, diminished in the bases, right greater than left. Cardio: Irregular rhythm, loud systolic murmur consistent with her history of mitral valve prolapse GI: Soft and nontender Extremities: All well perfused, no deformity.  No peripheral edema. Neurologic: Grossly normal  Diagnostic Studies & Laboratory data:     Recent Radiology Findings:   DG Chest 2 View  Result Date: 04/18/2021 CLINICAL DATA:  Shortness of breath over the last several days. EXAM: CHEST - 2 VIEW COMPARISON:  05/01/2020 FINDINGS: The cardiac silhouette is enlarged which could be due to cardiomegaly and/or pericardial fluid. Chronic aortic atherosclerotic calcification. Bilateral pleural effusions, larger on the right than the left, with dependent atelectasis in the lower lungs. Lung apices are clear.  IMPRESSION: Enlarged cardiac silhouette. Bilateral effusions, right larger than left, with volume loss in the lower lobes, right worse than left. Findings most consistent with congestive heart failure. Coexistent pneumonia not excluded. Electronically Signed   By: Nelson Chimes M.D.   On: 04/18/2021 12:36     CT CHEST WITHOUT CONTRAST   TECHNIQUE: Multidetector CT imaging of the chest was performed following the standard protocol without IV contrast.   COMPARISON:  05/02/2020, chest x-ray from earlier in the same day, PET-CT from 03/06/2021   FINDINGS: Cardiovascular: Limited due to the lack of IV contrast. Atherosclerotic calcifications of the aorta are noted. Pericardial effusion is seen measuring up to 3 cm thickness. No cardiac enlargement is noted. Coronary calcifications are noted. No enlargement of the pulmonary artery is seen.   Mediastinum/Nodes: Thoracic inlet is within normal limits. Small hiatal hernia is noted. The esophagus is otherwise within normal limits. Scattered small likely reactive lymph nodes are noted within the mediastinum.   Lungs/Pleura: Small left pleural effusion is noted. No focal infiltrate is seen. Large right-sided pleural effusion is noted with lower lobe consolidation and some upper lobe consolidation as well. The effusion has increased when compared with the prior PET-CT. Persistent right upper lobe lesion is noted laterally.   Upper Abdomen: Visualized upper abdomen shows no acute abnormality.   Musculoskeletal: Degenerative changes of the thoracic spine are noted. Fractures of the right fifth and sixth ribs are noted laterally stable  from prior exams.   IMPRESSION: Enlarging right-sided pleural effusion. The degree of consolidation seen on the right has progressed slightly particularly in the lower lobe related to the enlarging effusion.   Large pericardial effusion.   Stable right upper lobe nodule similar to that seen on prior  PET-CT.   Small hiatal hernia.   Aortic Atherosclerosis (ICD10-I70.0).     Electronically Signed   By: Inez Catalina M.D.   On: 04/18/2021 16:38     ECHOCARDIOGRAM REPORT     Patient Name:   JANCIE KERCHER Date of Exam: 04/18/2021  Medical Rec #:  [id number]   Height:       63.0 in  Accession #:    [id number]  Weight:       159.4 lb  Date of Birth:  05/16/1946   BSA:          1.756 m  Patient Age:    50 years    BP:           164/90 mmHg  Patient Gender: F           HR:           75 bpm.  Exam Location:  Inpatient   Procedure: 2D Echo, Cardiac Doppler and Color Doppler   REPORT CONTAINS CRITICAL RESULT   Indications:     I31.3 Pericardial effusion (noninflammatory)     History:         Patient has prior history of Echocardiogram examinations,  most                   recent 03/20/2021. Signs/Symptoms:Shortness of Breath and                   Dyspnea; Risk Factors:Hypertension and Dyslipidemia.  Breast                   cancer.     Sonographer:     Roseanna Rainbow RDCS  Referring Phys:  [id number] Margie Billet  Diagnosing Phys: Eleonore Chiquito MD   IMPRESSIONS     1. There is a large circumferential pericardial effusion that measures up  to 2.7 cm posterior to the LV. There is no RA/RV collapse in diastole.  There is ~30% variation in MV inflow. The IVC is dilated with <50%  collapse, but there is diastolic foward  flow in the hepatic veins. When compared with the prior study, the  effusion is larger and the IVC is now dilated. These changes suggest the  effusion is becoming significant. Would recommend clinical correlation for  tamponade. Large pericardial effusion.  The pericardial effusion is circumferential.   2. Left ventricular ejection fraction, by estimation, is 60 to 65%. The  left ventricle has normal function. The left ventricle has no regional  wall motion abnormalities. There is moderate concentric left ventricular  hypertrophy. Left ventricular  diastolic parameters are  consistent with Grade I diastolic dysfunction  (impaired relaxation).   3. Right ventricular systolic function is normal. The right ventricular  size is normal. Tricuspid regurgitation signal is inadequate for assessing  PA pressure.   4. The mitral valve is degenerative. No evidence of mitral valve  regurgitation. No evidence of mitral stenosis. Moderate to severe mitral  annular calcification.   5. The aortic valve is tricuspid. There is moderate calcification of the  aortic valve. There is moderate thickening of the aortic valve. Aortic  valve regurgitation is mild. Mild aortic valve stenosis. Aortic valve  area, by  VTI measures 1.71 cm. Aortic  valve mean gradient measures 10.0 mmHg. Aortic valve Vmax measures 2.06  m/s.   6. The inferior vena cava is dilated in size with >50% respiratory  variability, suggesting right atrial pressure of 8 mmHg.   Comparison(s): Changes from prior study are noted. Effusion is larger on  this study. IVC now dilated.   FINDINGS   Left Ventricle: Left ventricular ejection fraction, by estimation, is 60  to 65%. The left ventricle has normal function. The left ventricle has no  regional wall motion abnormalities. The left ventricular internal cavity  size was normal in size. There is   moderate concentric left ventricular hypertrophy. Left ventricular  diastolic parameters are consistent with Grade I diastolic dysfunction  (impaired relaxation).   Right Ventricle: The right ventricular size is normal. No increase in  right ventricular wall thickness. Right ventricular systolic function is  normal. Tricuspid regurgitation signal is inadequate for assessing PA  pressure.   Left Atrium: Left atrial size was normal in size.   Right Atrium: Right atrial size was normal in size.   Pericardium: There is a large circumferential pericardial effusion that  measures up to 2.7 cm posterior to the LV. There is no RA/RV collapse in  diastole. There is ~30%  variation in MV inflow. The IVC is dilated with  <50% collapse, but there is diastolic   foward flow in the hepatic veins. When compared with the prior study, the  effusion is larger and the IVC is now dilated. These changes suggest the  effusion is becoming significant. Would recommend clinical correlation for  tamponade. A large pericardial  effusion is present. The pericardial effusion is circumferential. Presence  of pericardial fat pad.   Mitral Valve: The mitral valve is degenerative in appearance. Moderate to  severe mitral annular calcification. No evidence of mitral valve  regurgitation. No evidence of mitral valve stenosis.   Tricuspid Valve: The tricuspid valve is grossly normal. Tricuspid valve  regurgitation is trivial. No evidence of tricuspid stenosis.   Aortic Valve: The aortic valve is tricuspid. There is moderate  calcification of the aortic valve. There is moderate thickening of the  aortic valve. Aortic valve regurgitation is mild. Aortic regurgitation PHT  measures 472 msec. Mild aortic stenosis is  present. Aortic valve mean gradient measures 10.0 mmHg. Aortic valve peak  gradient measures 17.0 mmHg. Aortic valve area, by VTI measures 1.71 cm.   Pulmonic Valve: The pulmonic valve was grossly normal. Pulmonic valve  regurgitation is trivial. No evidence of pulmonic stenosis.   Aorta: The aortic root and ascending aorta are structurally normal, with  no evidence of dilitation.   Venous: The inferior vena cava is dilated in size with greater than 50%  respiratory variability, suggesting right atrial pressure of 8 mmHg.   IAS/Shunts: The atrial septum is grossly normal.      LEFT VENTRICLE  PLAX 2D  LVIDd:         2.90 cm     Diastology  LVIDs:         2.10 cm     LV e' medial:    10.40 cm/s  LV PW:         1.60 cm     LV E/e' medial:  10.9  LV IVS:        1.30 cm     LV e' lateral:   9.57 cm/s  LVOT diam:     1.80 cm     LV E/e' lateral:  11.8  LV SV:          72  LV SV Index:   41  LVOT Area:     2.54 cm     LV Volumes (MOD)  LV vol d, MOD A2C: 49.5 ml  LV vol d, MOD A4C: 37.5 ml  LV vol s, MOD A2C: 18.3 ml  LV vol s, MOD A4C: 11.8 ml  LV SV MOD A2C:     31.2 ml  LV SV MOD A4C:     37.5 ml  LV SV MOD BP:      28.1 ml   RIGHT VENTRICLE             IVC  RV S prime:     14.30 cm/s  IVC diam: 2.30 cm  TAPSE (M-mode): 1.3 cm   LEFT ATRIUM             Index       RIGHT ATRIUM           Index  LA diam:        2.80 cm 1.59 cm/m  RA Area:     13.20 cm  LA Vol (A2C):   31.7 ml 18.05 ml/m RA Volume:   33.40 ml  19.02 ml/m  LA Vol (A4C):   28.2 ml 16.06 ml/m  LA Biplane Vol: 29.7 ml 16.91 ml/m   AORTIC VALVE  AV Area (Vmax):    1.66 cm  AV Area (Vmean):   1.64 cm  AV Area (VTI):     1.71 cm  AV Vmax:           206.00 cm/s  AV Vmean:          143.000 cm/s  AV VTI:            0.420 m  AV Peak Grad:      17.0 mmHg  AV Mean Grad:      10.0 mmHg  LVOT Vmax:         134.00 cm/s  LVOT Vmean:        92.000 cm/s  LVOT VTI:          0.283 m  LVOT/AV VTI ratio: 0.67  AI PHT:            472 msec     AORTA  Ao Root diam: 2.90 cm  Ao Asc diam:  3.90 cm   MITRAL VALVE  MV Area (PHT): 3.53 cm     SHUNTS  MV Decel Time: 215 msec     Systemic VTI:  0.28 m  MV E velocity: 113.00 cm/s  Systemic Diam: 1.80 cm  MV A velocity: 155.00 cm/s  MV E/A ratio:  0.73   Eleonore Chiquito MD  Electronically signed by Eleonore Chiquito MD  Signature Date/Time: 04/18/2021/4:30:52  I have independently reviewed the above radiologic studies and discussed with the patient    PREVIOUS THORACIC SURGERY  Op Note signed by [name], MD at 02/15/2019 10:03 AM   Author: [name], MD Service: Cardiothoracic Surgery Author Type: Physician  Filed: 02/15/2019 10:03 AM Date of Service: 02/15/2019  8:56 AM Status: Signed  Editor: [name], MD (Physician)           NAME: EDDITH, MENTOR MEDICAL RECORD PY:[id number] ACCOUNT  [id number] DATE OF BIRTH:Jan 15, 1946 FACILITY: [facility] LOCATION: [place], [place]   OPERATIVE REPORT   DATE OF PROCEDURE:  02/14/2019   PREOPERATIVE DIAGNOSIS:  Right anterior mediastinal mass.  POSTOPERATIVE DIAGNOSIS:  Right anterior mediastinal mass.  Final pathology pending.   PROCEDURE PERFORMED:  Right parasternal exploration with debridement and biopsy of right anterior mediastinal mass.   SURGEON:  Lanelle Bal, MD   FIRST ASSISTANT:  Lars Pinks, PA-C   BRIEF HISTORY:  The patient is a 75 year old lifelong nonsmoker.  The patient with a previous history of breast cancer status post left mastectomy.  The patient presented with a cough and respiratory symptoms.  A CT scan of the chest was performed which showed a right anterior mediastinal area, hypermetabolic with probable central necrosis.  Attempts at obtaining a tissue diagnosis of this were unsuccessful with percutaneous needle biopsy as only necrotic tissue was seen and no definitive diagnosis could be made.  To obtain a tissue diagnosis, we then recommended to the patient that we proceed with right parasternal exploration and direct biopsy.  The patient was agreeable with this.  Risks and options were discussed with her including the somewhat  increased risk of surgery during a pandemic, but less invasive procedures that had been attempted were unsuccessful.   DESCRIPTION OF PROCEDURE:  The patient underwent general endotracheal anesthesia without incident.  A double-lumen endotracheal tube was placed in case we needed to deflate the right lung.  The right chest and anterior chest were prepped with Betadine and draped in a sterile manner.  Appropriate timeout was performed.  We then made a small right parasternal incision approximately 4th intercostal space.  Dissection was carried down to the attachments of the pectoralis.  Fibers were separated, and the chest cavity anterior mediastinum was  entered.  A 3 cm area of white necrotic-appearing tissue was debrided away.  A portion of this was sent for frozen section and a portion sent for culture.  The initial fragments of the frozen section showed necrotic tissue with some areas of increased cellularity, but no definitive diagnosis could be made.  We continued our dissection slightly deeper until we noted a firmer area of firm tissue at the anterior mediastinum along where ____ mammary chain nodes would be  located.  A portion of this tissue was biopsied.  Initial frozen section showed adequate tissue for study.  A portion was sent for flow cytometry.  The pathologist described the tissue as "multiple small blue cells." Additional tissue from the same area  was also submitted in a third specimen cup to pathology for further studies.  After being assured we had sufficient tissue, a small 15-French Blake drain was left in the area and brought out through a separate site.  The incision was closed with interrupted 2-0 Vicryl in the subcutaneous layers and a running 3-0 Vicryl in subcuticular stitch was used to close the incision.  Dermabond was applied and a small dressing on the chest tube/drainage site.  Sponge and needle count was reported as correct at the completion of the procedure.  Blood loss was minimal.  The patient was awakened and extubated in the operating room, having tolerated the procedure without obvious complication.  The patient was then transferred to the recovery room for postoperative care.   LN/NUANCE D:02/14/2019 T:02/15/2019 JOB:[id number]      Recent Lab Findings: Lab Results  Component Value Date   WBC 5.6 04/18/2021   HGB 10.7 (L) 04/18/2021   HCT 32.6 (L) 04/18/2021   PLT 235 04/18/2021   GLUCOSE 107 (H) 04/18/2021   CHOL 181 02/27/2020   TRIG 159.0 (H) 02/27/2020   HDL 38.60 (L) 02/27/2020   LDLCALC 111 (H) 02/27/2020  ALT 24 08/29/2020   AST 27 08/29/2020   NA 137 04/18/2021   K 4.1 04/18/2021    CL 106 04/18/2021   CREATININE 0.76 04/18/2021   BUN 13 04/18/2021   CO2 25 04/18/2021   TSH 2.120 12/27/2020   INR 1.0 02/10/2019   HGBA1C 6.1 (H) 11/30/2014      Assessment / Plan:    -75 year old female with known past history of malignant thymoma diagnosed in April 2020 now status post treatment with chemotherapy and radiation presents with progressive shortness of breath.  She has a large right pleural effusion and an enlarging pericardial effusion without echocardiographic evidence of tamponade.  Plan to proceed with robotic assisted pericardial drainage with pericardial biopsy as well as drainage of the right pleural effusion tomorrow by Dr. [name].  Fluid samples will be sent for culture and cytology.  The various options in approaching these procedures were discussed with Mr. and Mrs. Lissa Conley and their questions were answered.  They agree with the plan to proceed with surgery tomorrow.  Dr. [name] will see Autumn Conley tomorrow and further details will follow.    -History of hypertension  -History of mitral valve prolapse, mild aortic stenosis  -History of fibromyalgia  -History of Paget's disease of the left breast, status post  left        mastectomy in 2009   I  spent 30 minutes counseling the patient face to face.   Antony Odea, PA-C  04/18/2021 5:58 PM   Agree with above. This is a 75 year old female with history of malignant thymoma presents with right-sided pleural effusion as well as a large circumferential pericardial effusion.  She has a history of radiation therapy for treatment of her thymoma, as well as a Chamberlain procedure on the right, as well as a right VATS for biopsy.  Overnight unfortunately she did not undergo a pericardial drain placement but she was diuresed with Lasix.  Symptomatically she says that her shortness of breath is better.  We discussed the risks and benefits of a right thoracoscopy robotic assisted with a pericardial window.   We will drain the right pleural effusion at the same time.  She likely will require IV fluids prior to induction.  Kaena Santori Bary Leriche

## 2021-04-18 NOTE — Progress Notes (Addendum)
  Echocardiogram 2D Echocardiogram has been performed. Large circumferential pericardial effusion has been identified. Dr. Damian Leavell has been notified.   Bobbye Charleston 04/18/2021, 3:55 PM

## 2021-04-18 NOTE — ED Notes (Signed)
Vital signs stable. 

## 2021-04-18 NOTE — Telephone Encounter (Signed)
Pt c/o Shortness Of Breath: STAT if SOB developed within the last 24 hours or pt is noticeably SOB on the phone  1. Are you currently SOB (can you hear that pt is SOB on the phone)? yes  2. How long have you been experiencing SOB? Worsening over the past few days. Pt has   3. Are you SOB when sitting or when up moving around? Up and moving around   4. Are you currently experiencing any other symptoms? No

## 2021-04-18 NOTE — ED Notes (Signed)
Family updated as to patient's status.

## 2021-04-18 NOTE — Progress Notes (Addendum)
Review of CT chest, moderate amount of right-sided pleural effusion.and again showing large quantity of pericardial effusion.  Continue current treatment therapy regimen Lasix twice daily, no strong indication for thoracentesis given the pleural effusion likely source from pericardial effusion, both can be treated with diuresis.  Waiting for cardiology input.  BP still quite elevated, start p.o. Coreg.

## 2021-04-18 NOTE — ED Notes (Signed)
Nurse report given to floor nurse Aniceto Boss, Shelly Bombard

## 2021-04-18 NOTE — ED Triage Notes (Signed)
Patient complains of shortness of breath that started several months ago that was improved after an iron infusion but has started getting worse approximately five days ago. Patient alert, oriented, and in no apparent distress at this time. Patient does not wear oxygen at baseline, SpO2 on room air 91%. Patient placed on 2L [place], SpO2 increased to 100%.

## 2021-04-18 NOTE — Consult Note (Addendum)
Cardiology Consultation:   Patient ID: Autumn Conley MRN: [id number]; DOB: 1946-05-13  Admit date: 04/18/2021 Date of Consult: 04/18/2021  PCP:  [name], [place] [facility] Cardiologist:  [name], MD   {   Patient Profile:   Autumn Conley is a 75 y.o. female with a hx of metastatic thymoma s/p chemoradiation, history of breast cancer status post left mastectomy , HTN, HLD, anxiety, IBS, NASH, fibromyalgia, iron deficiency anemia, OSA on CPAP, who is being seen 04/18/2021 for the evaluation of pericardial effusion at the request of Dr. Karel Jarvis.   History of Present Illness:   [name] was initially evaluated by Dr Johnsie Cancel on 11/30/2014 for inpatient consult for atypical chest pain. She had Myoview that was negative for ischemia and EF >75% but showed some ischemic change on EKG. Echo on 12/01/14 showed EF 65-70%, no RWMA. She later followed up with Dr Tamala Julian in the office on 01/29/2015, felt stress induced EKG changes are a false positive response to myocardial stress. She was lost to follow up with cardiology since 2016.    She started seeing Dr [name] in 05/14/2020 for generalized weakness, dizziness, SOB on exertion, near syncope, palpitation. Symptoms were felt largely due progressive worsening anemia with underlying malignancy. She had subsequent workup with Echo on 05/28/2020 that showed EF 70-75% with grade I DD, and zio monitor on 07/16/20 showed rare supraventricular and ventricular ectopy, no AV block or A fib.   Due to dyspnea and angina complaints, she had repeated Myoveiw on 12/14/20 which was low risk and normal. A follow up Echo on 12/21/20 showed EF 65-70%, no RWMA, Moderate circumferential pericardial effusion measuring up to 1cm adjacent to LV lateral wall. No evidence of tamponade with no RA/RV collapse. Mild AR and AS. On office visit 01/29/21, it was felt her pericardial effusion is post-viral in nature given her reports of 8 weeks of respiratory illness in  winter. She was receiving IV iron infusion at the time for anemia as well, which seem improving her DOE. She had event monitor placed on 12/26/20 which showed SVTs and junctional rhythm. Beta blocker was tried in the past for SVT and ectopy to help palpitation, but she did not feel it's helpful and this was not continued.   She was last seen in the office on 03/25/21, she felt her SOB is improving but husband felt she is much more winded with short distance ambulation such as "walking to her car". Echo was repeated on 03/20/21 showed large pericardial effusion (increased), circumferential, no evidence of increased pericardial pressures, EF 70-75%, indeterminate diastolic function, moderate mitral annular calcification , mild AR, mild AS, Aortic valve Vmax measures 2.55 m/s.   Patient called office today c/o worsening SOB for 4-5 days, having difficulty talking and walking. Dr [name] advised patient go to the ER. She still has a mild productive cough. She states she feels similarly when her Hgb is low and had required iron infusion in the past. She states over the past 4-5 days, she is getting SOB with minor activity. She is not oxygen dependent at home. She denied any chest pain or pressure at home. She denied any weight gain, leg edema.   Diagnostic at ER showed CBC with Hgb 10.7, mild lymphopenia, BMP unremarkable. BNP 163. Hs trop negative x1. CXR showed Enlarged cardiac silhouette. Bilateral effusions, right larger than left, with volume loss in the lower lobes, right worse than left. Findings most consistent with congestive heart failure. Coexistent pneumonia not excluded.  EKG showed sinus rhythm with rate of 79 bpm, PACs. She is hypertensive with BP up to 171/84, pox 91% RA, placed on [facility] at ED. She was given Lasix 52m x1 at ED, admitted to hospital medicine. Cardiology is consulted for pericardial effusion.      Past Medical History:  Diagnosis Date   Anemia    Anxiety    [place]     Depression    Dyspnea    Fibromyalgia    "some; not chronic" (11/29/2014)   GERD (gastroesophageal reflux disease)    Glaucoma of both eyes    Headache    Hypertension    IBS (irritable bowel syndrome)    Mitral valve prolapse    NASH (nonalcoholic steatohepatitis)    Osteoarthritis    PONV (postoperative nausea and vomiting)    Stroke ([facility]    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([facility]     Past Surgical History:  Procedure Laterality Date   ADamon  BIOPSY  05/03/2020   Procedure: BIOPSY;  Surgeon: GGatha Mayer MD;  [place]  Service: Endoscopy;;   BREAST BIOPSY Left    BREAST LUMPECTOMY Left    CATARACT EXTRACTION, BILATERAL  2018   COLONOSCOPY WITH PROPOFOL N/A 05/03/2020   Procedure: COLONOSCOPY WITH PROPOFOL;  Surgeon: GGatha Mayer MD;  [place] [facility] [place]  Service: Endoscopy;  Laterality: N/A;   ESOPHAGOGASTRODUODENOSCOPY (EGD) WITH PROPOFOL N/A 05/03/2020   Procedure: ESOPHAGOGASTRODUODENOSCOPY (EGD) WITH PROPOFOL;  Surgeon: GGatha Mayer MD;  [place]  Service: Endoscopy;  Laterality: N/A;   MASTECTOMY Left ~ 2009   PARASTERNAL EXPLORATION Right 02/14/2019   Procedure: PARASTERNAL MEDIAL EXPLORATION WITH BIOPIES.;  Surgeon: GGrace Isaac MD;  [place] [facility]  Service: Thoracic;  Laterality: Right;   POLYPECTOMY  05/03/2020   Procedure: POLYPECTOMY;  Surgeon: GGatha Mayer MD;  [facility]  Service: Endoscopy;;     Home Medications:  Prior to Admission medications   Medication Sig Start Date End Date Taking? Authorizing Provider  ALPRAZolam (Duanne Moron 0.25 MG tablet Take 0.5 tablets (0.125 mg total) by mouth daily as needed for anxiety. 02/27/20  Yes WInda Coke PA  Ascorbic Acid (VITAMIN C) 1000 MG tablet Take 1,000 mg by mouth daily.   Yes [provider]  atorvastatin (LIPITOR) 40 MG tablet TAKE 1 TABLET BY MOUTH AT  BEDTIME Patient taking differently: Take 40 mg by mouth  at bedtime. 11/22/20  Yes WInda Coke PA  AZO-CRANBERRY PO Take 1 tablet by mouth 2 (two) times daily.   Yes [provider]  butalbital-acetaminophen-caffeine (FIORICET) 50-325-40 MG tablet TAKE 1 TO 2 TABLETS BY  MOUTH EVERY 6 HOURS AS  NEEDED FOR HEADACHE(S) .  MANUFACTURER RECOMMENDS NOT EXCEEDING 6 TABLETS/DAY Patient taking differently: Take 1-2 tablets by mouth every 6 (six) hours as needed (for headaches- MANUFACTURER RECOMMENDS NOT EXCEEDING 6 TABLETS/DAY). 02/08/21  Yes WInda Coke PA  Cholecalciferol (VITAMIN D-3 PO) Take 1 capsule by mouth daily.   Yes [provider]  cyanocobalamin (,VITAMIN B-12,) 1000 MCG/ML injection Inject 1 mL (1,000 mcg total) into the muscle every 30 (thirty) days. 03/01/20  Yes WInda Coke PA  docusate sodium (COLACE) 100 MG capsule Take 100 mg by mouth 2 (two) times daily as needed for mild constipation or moderate constipation.   Yes [provider]  irbesartan (AVAPRO) 150 MG tablet TAKE 1 TABLET BY MOUTH  DAILY Patient taking differently: Take 150 mg by mouth  in the morning. 02/13/21  Yes Leamon Arnt, MD  ondansetron (ZOFRAN) 8 MG tablet Take 8 mg by mouth every 8 (eight) hours as needed for nausea or vomiting.   Yes [provider]  pantoprazole (PROTONIX) 40 MG tablet Take 1 tablet (40 mg total) by mouth daily before breakfast. 05/04/20  Yes Sheikh, Omair Latif, DO  PARoxetine (PAXIL) 20 MG tablet TAKE 3 TABLETS BY MOUTH IN  THE MORNING Patient taking differently: Take 60 mg by mouth in the morning. 12/07/20  Yes [name], PA  polyethylene glycol powder (GLYCOLAX/MIRALAX) 17 GM/SCOOP powder Take 17 g by mouth daily as needed for mild constipation.   Yes [provider]  SUMAtriptan (IMITREX) 100 MG tablet Take 1 tablet (100 mg total) by mouth 2 (two) times daily as needed. At least 2 hrs between doses as needed bid Patient taking differently: Take 100 mg by mouth 2 (two) times daily as  needed for migraine or headache (and allow at least 2 hours between doses). 03/12/21  Yes Vivi Barrack, MD  valACYclovir (VALTREX) 1000 MG tablet Take two tablets daily x 5 days. Then for future outbreaks, take 2 tablets by mouth every 12 hours for 1 day total Patient taking differently: Take 1,000 mg by mouth See admin instructions. Take 1,000 mg by mouth every twelve hours as directed for outbreaks of cold blisters 07/02/20  Yes [name], PA  vitamin E 180 MG (400 UNITS) capsule Take 400 Units by mouth daily.   Yes [provider]  estradiol (ESTRACE) 0.1 MG/GM vaginal cream Estrogen Cream Instruction Discard applicator Apply pea sized amount to tip of finger to urethra before bed. Wash hands well after application. Use Monday, Wednesday and Friday Patient not taking: Reported on 04/18/2021 01/09/21   Billey Co, MD  furosemide (LASIX) 20 MG tablet Take 1 tablet (20 mg total) by mouth daily. TAKE 1 TABLET BY MOUTH DAILY FOR 14 DAYS. Patient not taking: Reported on 04/18/2021 03/25/21   [name], MD    Inpatient Medications: Scheduled Meds:  vitamin C  1,000 mg Oral Daily   atorvastatin  40 mg Oral QHS   cyanocobalamin  1,000 mcg Intramuscular Q30 days   enoxaparin (LOVENOX) injection  40 mg Subcutaneous Q24H   [START ON 04/19/2021] irbesartan  150 mg Oral Daily   [START ON 04/19/2021] pantoprazole  40 mg Oral QAC breakfast   [START ON 04/19/2021] PARoxetine  60 mg Oral q AM   sodium chloride flush  3 mL Intravenous Q12H   Vitamin D-3   Oral Daily   vitamin E  400 Units Oral Daily    Continuous Infusions:  sodium chloride     PRN Meds:   Allergies:    Allergies  Allergen Reactions   Latex Rash and Other (See Comments)    Blisters, also    Social History:   Social History   Socioeconomic History   Marital status: Married    Spouse name: Not on file   Number of children: 1   Years of education: 14   Highest education level: Not on file  Occupational  History   Occupation: Retired  Tobacco Use   Smoking status: Never   Smokeless tobacco: Never  Vaping Use   Vaping Use: Never used  Substance and Sexual Activity   Alcohol use: No   Drug use: No   Sexual activity: Not Currently  Other Topics Concern   Not on file  Social History Narrative   Lives  at home with her husband.   Right-handed.   2 cups caffeine/day.   Social Determinants of Health   Financial Resource Strain: Not on file  Food Insecurity: Not on file  Transportation Needs: Not on file  Physical Activity: Not on file  Stress: Not on file  Social Connections: Not on file  Intimate Partner Violence: Not on file    Family History:    Family History  Problem Relation Age of Onset   Lung cancer Mother 80   Prostate cancer Brother 35   Bladder Cancer Neg Hx    Kidney cancer Neg Hx      ROS:  Constitutional: Denied fever, chills, malaise, night sweats Eyes: Denied vision change or loss Ears/Nose/Mouth/Throat: Denied ear ache, sore throat, coughing, sinus pain Cardiovascular: see HPI  Respiratory: see HPI  Gastrointestinal: Denied nausea, vomiting, abdominal pain, diarrhea Genital/Urinary: Denied dysuria, hematuria, urinary frequency/urgency Musculoskeletal: Denied muscle ache, joint pain, weakness Skin: Denied rash, wound Neuro: Denied headache, dizziness, syncope Psych: history of depression/anxiety  Endocrine: Denied history of diabetes  Physical Exam/Data:   Vitals:   04/18/21 1321 04/18/21 1415 04/18/21 1500 04/18/21 1600  BP:  (!) 164/90 (!) 186/103 (!) 156/80  Pulse:  71 77 79  Resp:  (!) _0 Temp:      TempSrc:      SpO2:  100% 100% 100%  Weight: 72.3 kg     Height: _1  (1.6 m)      No intake or output data in the 24 hours ending 04/18/21 1644 Last 3 Weights 04/18/2021 03/27/2021 03/25/2021  Weight (lbs) 159 lb 6.3 oz 159 lb 6.4 oz 162 lb 3.2 oz  Weight (kg) 72.3 kg 72.303 kg 73.573 kg     Body mass index is 28.24 kg/m.   Vitals:   Vitals:   04/18/21 1500 04/18/21 1600  BP: (!) 186/103 (!) 156/80  Pulse: 77 79  Resp: 19 15  Temp:    SpO2: 100% 100%   General Appearance: In no apparent distress, sitting  in bed HEENT: Normocephalic, atraumatic.  Neck: Supple, trachea midline, JVD 8-9CM Cardiovascular: Regular rate and rhythm, normal S1-S2,  no murmur/rub/gallop Respiratory: DOE with conversation, lungs sounds clear but diminished at base bilaterally to auscultation bilaterally, no use of accessory muscles. On 2LNC, pox 100%  Gastrointestinal: Bowel sounds positive, abdomen soft, non-tender, non-distended.  Extremities: Able to move all extremities in bed without difficulty, no edema/cyanosis/clubbing Genitourinary: genital exam not performed Musculoskeletal: Normal muscle bulk and tone,  no limited range of motion, no swollen or erythematous joints Skin: Intact, warm, dry. No rashes or petechiae noted in exposed areas.  Neurologic: Alert, oriented to person, place and time. Fluent speech,  no cognitive deficit, no gross focal neuro deficit Psychiatric: Normal affect. Mood is appropriate.   EKG:  The EKG was personally reviewed and demonstrates:  EKG showed sinus rhythm with rate of 79 bpm, PACs.  Telemetry:  Telemetry was personally reviewed and demonstrates:  Low voltage QRS, sinus rhythm with PACs, rate of 80s    Relevant CV Studies:  Echo from 04/18/21:   1. There is a large circumferential pericardial effusion that measures up  to 2.7 cm posterior to the LV. There is no RA/RV collapse in diastole.  There is ~30% variation in MV inflow. The IVC is dilated with <50%  collapse, but there is diastolic foward  flow in the hepatic veins. When compared with the prior study, the  effusion is larger and the IVC is  now dilated. These changes suggest the  effusion is becoming significant. Would recommend clinical correlation for  tamponade. Large pericardial effusion.  The pericardial effusion is circumferential.    2. Left ventricular ejection fraction, by estimation, is 60 to 65%. The  left ventricle has normal function. The left ventricle has no regional  wall motion abnormalities. There is moderate concentric left ventricular  hypertrophy. Left ventricular  diastolic parameters are consistent with Grade I diastolic dysfunction  (impaired relaxation).   3. Right ventricular systolic function is normal. The right ventricular  size is normal. Tricuspid regurgitation signal is inadequate for assessing  PA pressure.   4. The mitral valve is degenerative. No evidence of mitral valve  regurgitation. No evidence of mitral stenosis. Moderate to severe mitral  annular calcification.   5. The aortic valve is tricuspid. There is moderate calcification of the  aortic valve. There is moderate thickening of the aortic valve. Aortic  valve regurgitation is mild. Mild aortic valve stenosis. Aortic valve  area, by VTI measures 1.71 cm. Aortic  valve mean gradient measures 10.0 mmHg. Aortic valve Vmax measures 2.06  m/s.   6. The inferior vena cava is dilated in size with >50% respiratory  variability, suggesting right atrial pressure of 8 mmHg.   Comparison(s): Changes from prior study are noted. Effusion is larger on  this study. IVC now dilated.    Echo on 03/20/2021:   1. Large pericardial effusion. The pericardial effusion is  circumferential. There is no evidence of increased pericardial pressures.   2. Left ventricular ejection fraction, by estimation, is 70 to 75%. The  left ventricle has hyperdynamic function. Left ventricular diastolic  parameters are indeterminate.   3. Right ventricular systolic function is hyperdynamic.   4. The mitral valve is abnormal. Moderate mitral annular calcification.   5. The aortic valve is calcified. Aortic valve regurgitation is mild.  Mild aortic valve stenosis. Aortic valve Vmax measures 2.55 m/s.   6. The inferior vena cava is normal in size with greater than  50%  respiratory variability, suggesting right atrial pressure of 3 mmHg.   Comparison(s): A prior study was performed on 01/11/2021. Prior images  reviewed side by side. Compares to prior study, effusion is slightly  increased, best seen in comparison of the PSAX at the level of the aortic  valve.   Event monitor on 12/26/20:  Patient had a min HR of 45 bpm, max HR of 185 bpm, and avg HR of 66 bpm. Predominant underlying rhythm was Sinus Rhythm. Slight P wave morphology changes were noted. 15 Supraventricular Tachycardia runs occurred, the run with the fastest interval lasting 13 beats with a max rate of 185 bpm, the longest lasting 15 beats with an avg rate of 165 bpm. Junctional Rhythm was present. Junctional Rhythm was detected within +/- 45 seconds of symptomatic patient event(s). Isolated SVEs were rare (<1.0%), SVE Couplets were rare (<1.0%), and SVE Triplets were rare (<1.0%). Isolated VEs were rare (<1.0%), and no VE Couplets or VE Triplets were present. Ventricular Bigeminy was present.  Myoview on 12/14/20:  The left ventricular ejection fraction is hyperdynamic (>65%). Nuclear stress EF: 82%. There was no ST segment deviation noted during stress. The study is normal. This is a low risk study.   Normal resting and stress perfusion. No ischemia or infarction EF 82%    Laboratory Data:  High Sensitivity Troponin:   Recent Labs  Lab 04/18/21 1257  TROPONINIHS 9     Chemistry Recent Labs  Lab  04/18/21 1257  NA 137  K 4.1  CL 106  CO2 25  GLUCOSE 107*  BUN 13  CREATININE 0.76  CALCIUM 9.1  GFRNONAA >60  ANIONGAP 6    No results for input(s): PROT, ALBUMIN, AST, ALT, ALKPHOS, BILITOT in the last 168 hours. Hematology Recent Labs  Lab 04/18/21 1257  WBC 5.6  RBC 3.40*  HGB 10.7*  HCT 32.6*  MCV 95.9  MCH 31.5  MCHC 32.8  RDW 14.9  PLT 235   BNP Recent Labs  Lab 04/18/21 1257  BNP 163.9*    DDimer No results for input(s): DDIMER in the last 168  hours.   Radiology/Studies:  DG Chest 2 View  Result Date: 04/18/2021 CLINICAL DATA:  Shortness of breath over the last several days. EXAM: CHEST - 2 VIEW COMPARISON:  05/01/2020 FINDINGS: The cardiac silhouette is enlarged which could be due to cardiomegaly and/or pericardial fluid. Chronic aortic atherosclerotic calcification. Bilateral pleural effusions, larger on the right than the left, with dependent atelectasis in the lower lungs. Lung apices are clear. IMPRESSION: Enlarged cardiac silhouette. Bilateral effusions, right larger than left, with volume loss in the lower lobes, right worse than left. Findings most consistent with congestive heart failure. Coexistent pneumonia not excluded. Electronically Signed   By: Nelson Chimes M.D.   On: 04/18/2021 12:36   CT CHEST WO CONTRAST  Result Date: 04/18/2021 CLINICAL DATA:  Evaluate effusions, shortness of breath EXAM: CT CHEST WITHOUT CONTRAST TECHNIQUE: Multidetector CT imaging of the chest was performed following the standard protocol without IV contrast. COMPARISON:  05/02/2020, chest x-ray from earlier in the same day, PET-CT from 03/06/2021 FINDINGS: Cardiovascular: Limited due to the lack of IV contrast. Atherosclerotic calcifications of the aorta are noted. Pericardial effusion is seen measuring up to 3 cm thickness. No cardiac enlargement is noted. Coronary calcifications are noted. No enlargement of the pulmonary artery is seen. Mediastinum/Nodes: Thoracic inlet is within normal limits. Small hiatal hernia is noted. The esophagus is otherwise within normal limits. Scattered small likely reactive lymph nodes are noted within the mediastinum. Lungs/Pleura: Small left pleural effusion is noted. No focal infiltrate is seen. Large right-sided pleural effusion is noted with lower lobe consolidation and some upper lobe consolidation as well. The effusion has increased when compared with the prior PET-CT. Persistent right upper lobe lesion is noted  laterally. Upper Abdomen: Visualized upper abdomen shows no acute abnormality. Musculoskeletal: Degenerative changes of the thoracic spine are noted. Fractures of the right fifth and sixth ribs are noted laterally stable from prior exams. IMPRESSION: Enlarging right-sided pleural effusion. The degree of consolidation seen on the right has progressed slightly particularly in the lower lobe related to the enlarging effusion. Large pericardial effusion. Stable right upper lobe nodule similar to that seen on prior PET-CT. Small hiatal hernia. Aortic Atherosclerosis (ICD10-I70.0). Electronically Signed   By: Inez Catalina M.D.   On: 04/18/2021 16:38   ECHOCARDIOGRAM COMPLETE  Result Date: 04/18/2021    ECHOCARDIOGRAM REPORT   Patient Name:   LILIYANA THOBE Date of Exam: 04/18/2021 Medical Rec #:  [id number]   Height:       63.0 in Accession #:    [id number]  Weight:       159.4 lb Date of Birth:  12/25/1945   BSA:          1.756 m Patient Age:    21 years    BP:           164/90 mmHg Patient Gender: F  HR:           75 bpm. Exam Location:  Inpatient Procedure: 2D Echo, Cardiac Doppler and Color Doppler REPORT CONTAINS CRITICAL RESULT Indications:     I31.3 Pericardial effusion (noninflammatory)  History:         Patient has prior history of Echocardiogram examinations, most                  recent 03/20/2021. Signs/Symptoms:Shortness of Breath and                  Dyspnea; Risk Factors:Hypertension and Dyslipidemia. Breast                  cancer.  Sonographer:     Roseanna Rainbow RDCS Referring Phys:  [id number] Margie Billet Diagnosing Phys: Eleonore Chiquito MD IMPRESSIONS  1. There is a large circumferential pericardial effusion that measures up to 2.7 cm posterior to the LV. There is no RA/RV collapse in diastole. There is ~30% variation in MV inflow. The IVC is dilated with <50% collapse, but there is diastolic foward flow in the hepatic veins. When compared with the prior study, the effusion is larger and the IVC is now  dilated. These changes suggest the effusion is becoming significant. Would recommend clinical correlation for tamponade. Large pericardial effusion. The pericardial effusion is circumferential.  2. Left ventricular ejection fraction, by estimation, is 60 to 65%. The left ventricle has normal function. The left ventricle has no regional wall motion abnormalities. There is moderate concentric left ventricular hypertrophy. Left ventricular diastolic parameters are consistent with Grade I diastolic dysfunction (impaired relaxation).  3. Right ventricular systolic function is normal. The right ventricular size is normal. Tricuspid regurgitation signal is inadequate for assessing PA pressure.  4. The mitral valve is degenerative. No evidence of mitral valve regurgitation. No evidence of mitral stenosis. Moderate to severe mitral annular calcification.  5. The aortic valve is tricuspid. There is moderate calcification of the aortic valve. There is moderate thickening of the aortic valve. Aortic valve regurgitation is mild. Mild aortic valve stenosis. Aortic valve area, by VTI measures 1.71 cm. Aortic valve mean gradient measures 10.0 mmHg. Aortic valve Vmax measures 2.06 m/s.  6. The inferior vena cava is dilated in size with >50% respiratory variability, suggesting right atrial pressure of 8 mmHg. Comparison(s): Changes from prior study are noted. Effusion is larger on this study. IVC now dilated. FINDINGS  Left Ventricle: Left ventricular ejection fraction, by estimation, is 60 to 65%. The left ventricle has normal function. The left ventricle has no regional wall motion abnormalities. The left ventricular internal cavity size was normal in size. There is  moderate concentric left ventricular hypertrophy. Left ventricular diastolic parameters are consistent with Grade I diastolic dysfunction (impaired relaxation). Right Ventricle: The right ventricular size is normal. No increase in right ventricular wall thickness.  Right ventricular systolic function is normal. Tricuspid regurgitation signal is inadequate for assessing PA pressure. Left Atrium: Left atrial size was normal in size. Right Atrium: Right atrial size was normal in size. Pericardium: There is a large circumferential pericardial effusion that measures up to 2.7 cm posterior to the LV. There is no RA/RV collapse in diastole. There is ~30% variation in MV inflow. The IVC is dilated with <50% collapse, but there is diastolic  foward flow in the hepatic veins. When compared with the prior study, the effusion is larger and the IVC is now dilated. These changes suggest the effusion is becoming significant. Would recommend clinical  correlation for tamponade. A large pericardial effusion is present. The pericardial effusion is circumferential. Presence of pericardial fat pad. Mitral Valve: The mitral valve is degenerative in appearance. Moderate to severe mitral annular calcification. No evidence of mitral valve regurgitation. No evidence of mitral valve stenosis. Tricuspid Valve: The tricuspid valve is grossly normal. Tricuspid valve regurgitation is trivial. No evidence of tricuspid stenosis. Aortic Valve: The aortic valve is tricuspid. There is moderate calcification of the aortic valve. There is moderate thickening of the aortic valve. Aortic valve regurgitation is mild. Aortic regurgitation PHT measures 472 msec. Mild aortic stenosis is present. Aortic valve mean gradient measures 10.0 mmHg. Aortic valve peak gradient measures 17.0 mmHg. Aortic valve area, by VTI measures 1.71 cm. Pulmonic Valve: The pulmonic valve was grossly normal. Pulmonic valve regurgitation is trivial. No evidence of pulmonic stenosis. Aorta: The aortic root and ascending aorta are structurally normal, with no evidence of dilitation. Venous: The inferior vena cava is dilated in size with greater than 50% respiratory variability, suggesting right atrial pressure of 8 mmHg. IAS/Shunts: The atrial  septum is grossly normal.  LEFT VENTRICLE PLAX 2D LVIDd:         2.90 cm     Diastology LVIDs:         2.10 cm     LV e' medial:    10.40 cm/s LV PW:         1.60 cm     LV E/e' medial:  10.9 LV IVS:        1.30 cm     LV e' lateral:   9.57 cm/s LVOT diam:     1.80 cm     LV E/e' lateral: 11.8 LV SV:         72 LV SV Index:   41 LVOT Area:     2.54 cm  LV Volumes (MOD) LV vol d, MOD A2C: 49.5 ml LV vol d, MOD A4C: 37.5 ml LV vol s, MOD A2C: 18.3 ml LV vol s, MOD A4C: 11.8 ml LV SV MOD A2C:     31.2 ml LV SV MOD A4C:     37.5 ml LV SV MOD BP:      28.1 ml RIGHT VENTRICLE             IVC RV S prime:     14.30 cm/s  IVC diam: 2.30 cm TAPSE (M-mode): 1.3 cm LEFT ATRIUM             Index       RIGHT ATRIUM           Index LA diam:        2.80 cm 1.59 cm/m  RA Area:     13.20 cm LA Vol (A2C):   31.7 ml 18.05 ml/m RA Volume:   33.40 ml  19.02 ml/m LA Vol (A4C):   28.2 ml 16.06 ml/m LA Biplane Vol: 29.7 ml 16.91 ml/m  AORTIC VALVE AV Area (Vmax):    1.66 cm AV Area (Vmean):   1.64 cm AV Area (VTI):     1.71 cm AV Vmax:           206.00 cm/s AV Vmean:          143.000 cm/s AV VTI:            0.420 m AV Peak Grad:      17.0 mmHg AV Mean Grad:      10.0 mmHg LVOT Vmax:  134.00 cm/s LVOT Vmean:        92.000 cm/s LVOT VTI:          0.283 m LVOT/AV VTI ratio: 0.67 AI PHT:            472 msec  AORTA Ao Root diam: 2.90 cm Ao Asc diam:  3.90 cm MITRAL VALVE MV Area (PHT): 3.53 cm     SHUNTS MV Decel Time: 215 msec     Systemic VTI:  0.28 m MV E velocity: 113.00 cm/s  Systemic Diam: 1.80 cm MV A velocity: 155.00 cm/s MV E/A ratio:  0.73 Eleonore Chiquito MD Electronically signed by Eleonore Chiquito MD Signature Date/Time: 04/18/2021/4:30:52 PM    Final (Updated)      Assessment and Plan:   Large pericardial effusion - Pericardial effusion was initially found on echocardiogram 12/21/2020, progressively increased in size, moderate to large PET scan 5/18, large echo 03/20/2021 - clinically not hypotensive, tachycardic at  this time - Echo repeat today, large circumferential pericardial effusion that measures up to 2.7 cm posterior to the LV. There is no RA/RV collapse in diastole. There is ~30% variation in MV inflow. The IVC is dilated with <50% collapse, but there is diastolic foward flow in the hepatic veins. When compared with the prior study, the effusion is larger and the IVC is now dilated. These changes suggest the effusion is becoming significant. - CT chest confirmed large pericardial effusion  - consult requested to CTS to evaluate for pericardial window given history metastatic cancer, will need diagnostic to determine etiology   Right pleural effusion - R> L on CXR, CT chest showed enlarging right-sided pleural effusion - s/p IV Lasix at ED, hold off further diuresis.  CT surgery consult as above  Metastatic thymoma, in remission  -Follows Dr. Mindi Junker at [facility]ology, s/p chemoradiation, last PET on 5/18, recommended CT surveillance  in 42-monthand office visit 03/26/2021  Hypertension -Blood pressure elevated at ED, home regimen resumed including irbesartan 150 mg daily, will monitor BP trend  Hyperlipidemia -Continue statin  OSA -Resume CPAP at night  Iron deficiency anemia -Hemoglobin 10.7 POA, near baseline, hx of iron infusion in the past, no bleeding   Anxiety with depression Fibromyalgia NASH IBS GERD -Managed per IM    Risk Assessment/Risk Scores:    New York Heart Association (NYHA) Functional Class NYHA Class II        For questions or updates, please contact [facility] Please consult [email or web address] for contact info under    Signed, XMargie Billet NP  04/18/2021 4:44 PM   Patient seen and examined.  Agree with above documentation.  Ms. ENavarreteis a 75year old female with a history of metastatic thymoma status post chemoradiation, breast cancer status post mastectomy, OSA, hypertension, hyperlipidemia, fibromyalgia, iron deficiency anemia who we are consulted by Dr.  ZRoosevelt Locksfor evaluation of pericardial effusion.  She follows with Dr. AMargaretann Loveless initially seen in July 2021 for dizziness and shortness of breath on exertion.  Echocardiogram 05/28/2020 showed hyperdynamic LV function, no effusion.  She reported worsening dyspnea and underwent a echo on 12/21/2020 which showed EF 65 to 70%, moderate circumferential pericardial effusion measuring up to 1 cm adjacent to LV lateral wall.  No evidence of tamponade at that time.  Effusion was being monitored and underwent repeat echo on 03/20/2021, which showed effusion had increased in size, was now large, no evidence of tamponade.  She presents to the ED today with worsening shortness of breath x4 to 5 days.  Denies any chest pain.  In the ED, initial vital signs notable for BP 171/84, pulse 76, SPO2 91% on room air.  She was started on 2 L [place] with improvement in SPO2 to 100%.  Labs notable for creatinine 0.76, potassium 4.1, BNP 164, troponin 9, WBC 5.6, hemoglobin 10.7, COVID-19 negative.  CT chest shows large pericardial effusion, enlarging right-sided pleural effusion.  Echocardiogram shows large circumferential pericardial effusion measuring up to 2.7 cm posterior to the LV, no RA/RV collapse, significant mitral inflow respiratory variation, fixed/dilated IVC.  Overall, suggests effusion is becoming significant.  Echo otherwise notable for LVEF 60 to 65%, normal RV function, moderate to severe MAC, mild aortic stenosis.  EKG shows normal sinus rhythm with PACs, rate 79, low voltage QRS.  On exam, patient is alert and oriented, regular rate and rhythm, 2/6 systolic murmur, diminished breath sounds at right base, no LE edema, + JVD.  For her pericardial effusion, she has a large large effusion and while no clear RV diastolic collapse, there is evidence of tamponade physiology with significant mitral inflow respiratory variation and fixed/dilated IVC.  No clinical evidence of tamponade at this time, she is normotensive and not tachycardic.   Recommend drainage.  Given her history of metastatic cancer, would recommend evaluation by cardiac surgery for pericardial window.  She was given Lasix in the ED, would avoid further diuresis at this time given tamponade physiology on echo.  Donato Heinz, MD

## 2021-04-18 NOTE — Telephone Encounter (Signed)
Spoke to patient she stated she is sob.Stated sob has been worse over the last 4 to 5 day.Hard to talk and walk across room.No swelling in lower legs.Spoke to Dr.Acharya she advised to go to [facility] ED.

## 2021-04-18 NOTE — ED Provider Notes (Signed)
[facility] EMERGENCY DEPARTMENT Provider Note   CSN: [id number] Arrival date & time: 04/18/21  1125     History Chief Complaint  Patient presents with   Shortness of Breath    Autumn Conley is a 75 y.o. female with a past medical history significant for anemia, anxiety, depression, fibromyalgia, GERD, hypertension, IBS, history of CVA, and thymus cancer status post radiation and chemotherapy who presents to the ED due to worsening shortness of breath for the past 5 days.  Patient states she has baseline shortness of breath however, for the past 5 days it has worsened.  Patient notes shortness of breath is worse with exertion.  She admits to a mild productive cough which is somewhat normal for her.  She states she has a history of anemia which has required iron transfusions in the past. She notes she typically feels this way when her hemoglobin is low.  Denies hematochezia, hematemesis, and melena.  Denies fever and chills.  No sick contacts or known COVID exposures.  Denies history of blood clots, recent surgeries, recent long immobilizations, and hormonal treatments.  She does have a history of cancer.  Denies lower extremity edema. Denies associated central chest pain; however admits to chronic right-sided chest wall pain that was believed to be related to nerve pain per pulmonology. No treatment prior to arrival. Patient states she was advised by her Cardiologist to report to the ED for further evaluation.  No history of COPD or asthma.   Chart reviewed.  Patient has known pericardial effusion and is being followed by Dr. [name] with repeat echocardiograms.   History obtained from patient and past medical records. No interpreter used during encounter.       Past Medical History:  Diagnosis Date   Anemia    Anxiety    Breast cancer ([place])    Depression    Dyspnea    Fibromyalgia    "some; not chronic" (11/29/2014)   GERD (gastroesophageal reflux disease)    Glaucoma  of both eyes    Headache    Hypertension    IBS (irritable bowel syndrome)    Mitral valve prolapse    NASH (nonalcoholic steatohepatitis)    Osteoarthritis    PONV (postoperative nausea and vomiting)    Stroke ([place])    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([place])     Patient Active Problem List   Diagnosis Date Noted   Recurrent UTI 06/28/2020   Inflammatory arthritis 05/16/2020   Erosive gastritis    Benign neoplasm of ascending colon    Heme + stool    Change in bowel habit    Anemia 05/01/2020   Dyspnea 05/01/2020   Generalized weakness 05/01/2020   UTI (urinary tract infection) 05/01/2020   Symptomatic anemia 05/01/2020   Glaucoma of both eyes    NASH (nonalcoholic steatohepatitis)    Mitral valve prolapse    Thymoma -- managed by [facility] - extensive documentation in Care Everywhere 03/22/2019   Breast cancer ([place]) 03/18/2019   Mass of upper lobe of right lung 01/20/2019   B12 deficiency 12/19/2018   Stress incontinence in female 12/19/2018   Dyslipidemia 12/14/2018   OSA (obstructive sleep apnea) 12/07/2018   History of breast cancer 12/07/2018   Cognitive complaints 11/25/2018   False positive stress test 01/29/2015   Migraine without aura and without status migrainosus, not intractable 12/18/2014   Small vessel disease, cerebrovascular 12/18/2014   GERD (gastroesophageal reflux disease) 12/02/2014   HTN (hypertension) 12/27/2013   Anxiety 12/27/2013  Depression 11/03/2012   Elevated LFTs 11/03/2012   Fibromyalgia 11/03/2012   IBS (irritable bowel syndrome) 11/03/2012   Lymphocytic colitis 11/03/2012   Paget's disease of nipple ([place]) 11/03/2012   Vitamin D deficiency 11/03/2012   Osteoarthritis 11/03/2012    Past Surgical History:  Procedure Laterality Date   [place]   BIOPSY  05/03/2020   Procedure: BIOPSY;  Surgeon: Gatha Mayer, MD;  Location: [facility];  Service: Endoscopy;;   BREAST BIOPSY Left    BREAST LUMPECTOMY Left     CATARACT EXTRACTION, BILATERAL  2018   COLONOSCOPY WITH PROPOFOL N/A 05/03/2020   Procedure: COLONOSCOPY WITH PROPOFOL;  Surgeon: Gatha Mayer, MD;  Location: [facility] ENDOSCOPY;  Service: Endoscopy;  Laterality: N/A;   ESOPHAGOGASTRODUODENOSCOPY (EGD) WITH PROPOFOL N/A 05/03/2020   Procedure: ESOPHAGOGASTRODUODENOSCOPY (EGD) WITH PROPOFOL;  Surgeon: Gatha Mayer, MD;  Location: [facility];  Service: Endoscopy;  Laterality: N/A;   MASTECTOMY Left ~ 2009   PARASTERNAL EXPLORATION Right 02/14/2019   Procedure: PARASTERNAL MEDIAL EXPLORATION WITH BIOPIES.;  Surgeon: [name], MD;  Location: [facility];  Service: Thoracic;  Laterality: Right;   POLYPECTOMY  05/03/2020   Procedure: POLYPECTOMY;  Surgeon: Gatha Mayer, MD;  Location: [facility] ENDOSCOPY;  Service: Endoscopy;;     OB History   No obstetric history on file.     Family History  Problem Relation Age of Onset   Lung cancer Mother 34   Prostate cancer Brother 78   Bladder Cancer Neg Hx    Kidney cancer Neg Hx     Social History   Tobacco Use   Smoking status: Never   Smokeless tobacco: Never  Vaping Use   Vaping Use: Never used  Substance Use Topics   Alcohol use: No   Drug use: No    Home Medications Prior to Admission medications   Medication Sig Start Date End Date Taking? Authorizing Provider  ALPRAZolam Duanne Moron) 0.25 MG tablet Take 0.5 tablets (0.125 mg total) by mouth daily as needed for anxiety. 02/27/20   [name], PA  Ascorbic Acid (VITAMIN C) 1000 MG tablet Take 1,000 mg by mouth daily.    [provider]  atorvastatin (LIPITOR) 40 MG tablet TAKE 1 TABLET BY MOUTH AT  BEDTIME 11/22/20   [name], PA  AZO-CRANBERRY PO Take by mouth 2 (two) times daily. 1 tab twice daily    [provider]  butalbital-acetaminophen-caffeine (FIORICET) 50-325-40 MG tablet TAKE 1 TO 2 TABLETS BY  MOUTH EVERY 6 HOURS AS  NEEDED FOR HEADACHE(S) .  MANUFACTURER RECOMMENDS NOT EXCEEDING 6 TABLETS/DAY  02/08/21   [name], PA  Cholecalciferol (VITAMIN D-3 PO) Take 1 capsule by mouth daily.    [provider]  cyanocobalamin (,VITAMIN B-12,) 1000 MCG/ML injection Inject 1 mL (1,000 mcg total) into the muscle every 30 (thirty) days. 03/01/20   [name], PA  docusate sodium (COLACE) 100 MG capsule Take 100 mg by mouth 2 (two) times daily. Pt isn't sure if she is taking 50 mg or 100 mg bid    [provider]  estradiol (ESTRACE) 0.1 MG/GM vaginal cream Estrogen Cream Instruction Discard applicator Apply pea sized amount to tip of finger to urethra before bed. Wash hands well after application. Use Monday, Wednesday and Friday 01/09/21   Billey Co, MD  furosemide (LASIX) 20 MG tablet Take 1 tablet (20 mg total) by mouth daily. TAKE 1 TABLET BY MOUTH DAILY FOR 14 DAYS.  03/25/21   [name], MD  irbesartan (AVAPRO) 150 MG tablet TAKE 1 TABLET BY MOUTH  DAILY 02/13/21   Leamon Arnt, MD  ondansetron (ZOFRAN) 8 MG tablet Take 8 mg by mouth every 8 (eight) hours as needed for nausea or vomiting.    [provider]  pantoprazole (PROTONIX) 40 MG tablet Take 1 tablet (40 mg total) by mouth daily before breakfast. 05/04/20   Raiford Noble Latif, DO  PARoxetine (PAXIL) 20 MG tablet TAKE 3 TABLETS BY MOUTH IN  THE MORNING 12/07/20   [name], PA  polyethylene glycol powder (GLYCOLAX/MIRALAX) 17 GM/SCOOP powder Take 17 g by mouth daily as needed for mild constipation.    [provider]  SUMAtriptan (IMITREX) 100 MG tablet Take 1 tablet (100 mg total) by mouth 2 (two) times daily as needed. At least 2 hrs between doses as needed bid 03/12/21   Vivi Barrack, MD  valACYclovir (VALTREX) 1000 MG tablet Take two tablets daily x 5 days. Then for future outbreaks, take 2 tablets by mouth every 12 hours for 1 day total 07/02/20   [name], PA  vitamin E 180 MG (400 UNITS) capsule Take 400 Units by mouth daily.    [provider]     Allergies    Latex  Review of Systems   Review of Systems  Constitutional:  Negative for chills and fever.  Respiratory:  Positive for cough and shortness of breath.   Cardiovascular:  Negative for chest pain and leg swelling.  Gastrointestinal:  Negative for abdominal pain, nausea and vomiting.  All other systems reviewed and are negative.  Physical Exam Updated Vital Signs BP (!) 164/90   Pulse 71   Temp 98.2 F (36.8 C) (Oral)   Resp (!) 23   Ht 5' 3"  (1.6 m)   Wt 72.3 kg   SpO2 100%   BMI 28.24 kg/m   Physical Exam Vitals and nursing note reviewed.  Constitutional:      General: She is not in acute distress.    Appearance: She is not ill-appearing.  HENT:     Head: Normocephalic.  Eyes:     Pupils: Pupils are equal, round, and reactive to light.  Cardiovascular:     Rate and Rhythm: Normal rate and regular rhythm.     Pulses: Normal pulses.     Heart sounds: Normal heart sounds. No murmur heard.   No friction rub. No gallop.  Pulmonary:     Effort: Pulmonary effort is normal.     Breath sounds: Normal breath sounds.     Comments: Respirations equal and unlabored, patient able to speak in full sentences, lungs clear to auscultation bilaterally Abdominal:     General: Abdomen is flat. There is no distension.     Palpations: Abdomen is soft.     Tenderness: There is no abdominal tenderness. There is no guarding or rebound.  Musculoskeletal:        General: Normal range of motion.     Cervical back: Neck supple.     Comments: 1+ pitting edema bilaterally. Negative homan sign bilaterally.  Skin:    General: Skin is warm and dry.  Neurological:     General: No focal deficit present.     Mental Status: She is alert.  Psychiatric:        Mood and Affect: Mood normal.        Behavior: Behavior normal.    ED Results / Procedures / Treatments  Labs (all labs ordered are listed, but only abnormal results are displayed) Labs Reviewed  CBC WITH  DIFFERENTIAL/PLATELET - Abnormal; Notable for the following components:      Result Value   RBC 3.40 (*)    Hemoglobin 10.7 (*)    HCT 32.6 (*)    Lymphs Abs 0.5 (*)    All other components within normal limits  BASIC METABOLIC PANEL - Abnormal; Notable for the following components:   Glucose, Bld 107 (*)    All other components within normal limits  BRAIN NATRIURETIC PEPTIDE - Abnormal; Notable for the following components:   B Natriuretic Peptide 163.9 (*)    All other components within normal limits  RESP PANEL BY RT-PCR (FLU A&B, COVID) ARPGX2  TROPONIN I (HIGH SENSITIVITY)    EKG EKG Interpretation  Date/Time:  Thursday April 18 2021 11:39:09 EDT Ventricular Rate:  79 PR Interval:  102 QRS Duration: 70 QT Interval:  412 QTC Calculation: 472 R Axis:   38 Text Interpretation: Sinus rhythm with short PR with Premature atrial complexes Low voltage QRS Cannot rule out Anterior infarct , age undetermined Abnormal ECG NSR Confirmed by Lavenia Atlas [phone]) on 04/18/2021 2:17:04 PM  Radiology DG Chest 2 View  Result Date: 04/18/2021 CLINICAL DATA:  Shortness of breath over the last several days. EXAM: CHEST - 2 VIEW COMPARISON:  05/01/2020 FINDINGS: The cardiac silhouette is enlarged which could be due to cardiomegaly and/or pericardial fluid. Chronic aortic atherosclerotic calcification. Bilateral pleural effusions, larger on the right than the left, with dependent atelectasis in the lower lungs. Lung apices are clear. IMPRESSION: Enlarged cardiac silhouette. Bilateral effusions, right larger than left, with volume loss in the lower lobes, right worse than left. Findings most consistent with congestive heart failure. Coexistent pneumonia not excluded. Electronically Signed   By: Nelson Chimes M.D.   On: 04/18/2021 12:36    Procedures Procedures   Medications Ordered in ED Medications  furosemide (LASIX) injection 40 mg (has no administration in time range)  acetaminophen (TYLENOL)  tablet 650 mg (650 mg Oral Given 04/18/21 1430)    ED Course  I have reviewed the triage vital signs and the nursing notes.  Pertinent labs & imaging results that were available during my care of the patient were reviewed by me and considered in my medical decision making (see chart for details).    MDM Rules/Calculators/A&P                         75 year old female presents to the ED due to acute on chronic shortness of breath that has worsened over the past 5 days.  History of thymus cancer status post radiation and chemotherapy.  Patient has a history of anemia which has required iron transfusions.  Patient notes she felt similar in the past when her hemoglobin was low.  Patient denies melena, hematochezia, and hematemesis.  Upon arrival, patient afebrile, not tachycardic with oxygen saturation at 91%.  Patient placed on 2 L nasal cannula with improvement in oxygen.  Patient nontoxic-appearing.  Physical exam reassuring.  Lungs clear to auscultation bilaterally without wheeze, rhonchi, or rales.  1+ pitting edema bilaterally. No clinical signs of DVT on exam.  Routine labs ordered for rule out symptomatic anemia.  Chest x-ray and EKG ordered at triage.  Troponin added to rule out cardiac etiology.  Patient deferred COVID test.  Patient would like to hold off on d-dimer until hemoglobin is back because she feels this is  the cause of her shortness of breath.   CBC significant for mild anemia at 10.7.  No leukocytosis.  BMP reassuring with normal renal function no major electrolyte derangements.  Troponin normal at 9.  Low suspicion for ACS.  BNP mildly elevated at 163.9. CXR personally reviewed which demonstrates: IMPRESSION:  Enlarged cardiac silhouette. Bilateral effusions, right larger than  left, with volume loss in the lower lobes, right worse than left.  Findings most consistent with congestive heart failure. Coexistent  pneumonia not excluded.   Given patient's chest x-ray findings and  known pericardial effusion with worsening shortness of breath patient will benefit from admission for further work-up. COVID test ordered. IV lasix started. Discussed case with Dr. Dina Rich who agrees with assessment and plan.   Discussed case with Dr. Roosevelt Locks with [facility] who agrees to admit patient for further treatment.  Final Clinical Impression(s) / ED Diagnoses Final diagnoses:  Shortness of breath    Rx / DC Orders ED Discharge Orders     None        Suzy Bouchard, PA-C 04/18/21 1447    Horton, Alvin Critchley, DO 04/22/21 1748

## 2021-04-18 NOTE — ED Notes (Signed)
Patient denies pain and is resting comfortably.  

## 2021-04-18 NOTE — H&P (Signed)
History and Physical    Kenijah Benningfield QBH:[id number] DOB: 11/21/1945 DOA: 04/18/2021  PCP: [name], PA (Confirm with patient/family/NH records and if not entered, this has to be entered at [facility] point of entry) Patient coming from: Home  I have personally briefly reviewed patient's old medical records in [place]  Chief Complaint: SOB  HPI: Autumn Conley is a 75 y.o. female with medical history significant of postviral pericardial effusion, thymoma status post radiation and chemotherapy, pulmonary fibrosis secondary to irradiation, HTN, HLD, presented with worsening of shortness of breath.  Patient was diagnosed with thymoma last year and treated with chemo and radiation therapy.  Patient started to develop symptoms of exertional dyspnea earlier this year, initially was considered to be new onset of anemia secondary to chronic illness for which she was treated with blood transfusion and iron infusion x2.  Her symptoms however did not completely resolve and she went to see cardiology in April 2022, echocardiogram showed moderate pericardial effusion, which was considered to be postviral etiology as her symptoms can be traced back to December 2021 when she had a significant URI symptoms.  Subsequently, patient has been following with cardiology with every 6 weeks echocardiogram, and the most recent Echo on June 6 showed large pericardial effusion.  Cardiology then started patient on 2 weeks of p.o. Lasix, patient however able only to take 7 days course then stopped due to severe diarrhea.  Same time, patient's symptoms of exertional dyspnea continued getting worse, even minimum activity and talking lead to feeling of severe dyspnea.  She also has had chronic dry cough for last 1 month but no fever chills.  She also occasionally experiences tightness on the right side of chest, cannot take deep breath.  Today she called her cardiology who sent her to the ED.  ED Course: Chest x-ray showed  cardiomegaly and moderate amount of pleural effusion right> left.  Review of Systems: As per HPI otherwise 14 point review of systems negative.    Past Medical History:  Diagnosis Date   Anemia    Anxiety    Breast cancer ([place])    Depression    Dyspnea    Fibromyalgia    "some; not chronic" (11/29/2014)   GERD (gastroesophageal reflux disease)    Glaucoma of both eyes    Headache    Hypertension    IBS (irritable bowel syndrome)    Mitral valve prolapse    NASH (nonalcoholic steatohepatitis)    Osteoarthritis    PONV (postoperative nausea and vomiting)    Stroke ([place])    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[facility])     Past Surgical History:  Procedure Laterality Date   [place]   BIOPSY  05/03/2020   Procedure: BIOPSY;  Surgeon: Gatha Mayer, MD;  Location: [facility];  Service: Endoscopy;;   BREAST BIOPSY Left    BREAST LUMPECTOMY Left    CATARACT EXTRACTION, BILATERAL  2018   COLONOSCOPY WITH PROPOFOL N/A 05/03/2020   Procedure: COLONOSCOPY WITH PROPOFOL;  Surgeon: Gatha Mayer, MD;  Location: [place];  Service: Endoscopy;  Laterality: N/A;   ESOPHAGOGASTRODUODENOSCOPY (EGD) WITH PROPOFOL N/A 05/03/2020   Procedure: ESOPHAGOGASTRODUODENOSCOPY (EGD) WITH PROPOFOL;  Surgeon: Gatha Mayer, MD;  Location: [place];  Service: Endoscopy;  Laterality: N/A;   MASTECTOMY Left ~ 2009   PARASTERNAL EXPLORATION Right 02/14/2019   Procedure: PARASTERNAL MEDIAL EXPLORATION WITH BIOPIES.;  Surgeon: [name], MD;  Location: [place];  Service: Thoracic;  Laterality: Right;   POLYPECTOMY  05/03/2020   Procedure: POLYPECTOMY;  Surgeon: Gatha Mayer, MD;  Location: [facility] ENDOSCOPY;  Service: Endoscopy;;     reports that she has never smoked. She has never used smokeless tobacco. She reports that she does not drink alcohol and does not use drugs.  Allergies  Allergen Reactions   Latex Rash and Other (See Comments)    Blisters, also    Family  History  Problem Relation Age of Onset   Lung cancer Mother 83   Prostate cancer Brother 75   Bladder Cancer Neg Hx    Kidney cancer Neg Hx      Prior to Admission medications   Medication Sig Start Date End Date Taking? Authorizing Provider  ALPRAZolam Duanne Moron) 0.25 MG tablet Take 0.5 tablets (0.125 mg total) by mouth daily as needed for anxiety. 02/27/20  Yes [name], PA  Ascorbic Acid (VITAMIN C) 1000 MG tablet Take 1,000 mg by mouth daily.   Yes [provider]  atorvastatin (LIPITOR) 40 MG tablet TAKE 1 TABLET BY MOUTH AT  BEDTIME Patient taking differently: Take 40 mg by mouth at bedtime. 11/22/20  Yes [name], PA  AZO-CRANBERRY PO Take 1 tablet by mouth 2 (two) times daily.   Yes [provider]  butalbital-acetaminophen-caffeine (FIORICET) 50-325-40 MG tablet TAKE 1 TO 2 TABLETS BY  MOUTH EVERY 6 HOURS AS  NEEDED FOR HEADACHE(S) .  MANUFACTURER RECOMMENDS NOT EXCEEDING 6 TABLETS/DAY Patient taking differently: Take 1-2 tablets by mouth every 6 (six) hours as needed (for headaches- MANUFACTURER RECOMMENDS NOT EXCEEDING 6 TABLETS/DAY). 02/08/21  Yes [name], PA  Cholecalciferol (VITAMIN D-3 PO) Take 1 capsule by mouth daily.   Yes [provider]  cyanocobalamin (,VITAMIN B-12,) 1000 MCG/ML injection Inject 1 mL (1,000 mcg total) into the muscle every 30 (thirty) days. 03/01/20  Yes [name], PA  docusate sodium (COLACE) 100 MG capsule Take 100 mg by mouth 2 (two) times daily as needed for mild constipation or moderate constipation.   Yes [provider]  irbesartan (AVAPRO) 150 MG tablet TAKE 1 TABLET BY MOUTH  DAILY Patient taking differently: Take 150 mg by mouth in the morning. 02/13/21  Yes Leamon Arnt, MD  ondansetron (ZOFRAN) 8 MG tablet Take 8 mg by mouth every 8 (eight) hours as needed for nausea or vomiting.   Yes [provider]  pantoprazole (PROTONIX) 40 MG tablet Take 1 tablet (40 mg total) by  mouth daily before breakfast. 05/04/20  Yes Sheikh, Omair Latif, DO  PARoxetine (PAXIL) 20 MG tablet TAKE 3 TABLETS BY MOUTH IN  THE MORNING Patient taking differently: Take 60 mg by mouth in the morning. 12/07/20  Yes [name], PA  polyethylene glycol powder (GLYCOLAX/MIRALAX) 17 GM/SCOOP powder Take 17 g by mouth daily as needed for mild constipation.   Yes [provider]  SUMAtriptan (IMITREX) 100 MG tablet Take 1 tablet (100 mg total) by mouth 2 (two) times daily as needed. At least 2 hrs between doses as needed bid Patient taking differently: Take 100 mg by mouth 2 (two) times daily as needed for migraine or headache (and allow at least 2 hours between doses). 03/12/21  Yes Vivi Barrack, MD  valACYclovir (VALTREX) 1000 MG tablet Take two tablets daily x 5 days. Then for future outbreaks, take 2 tablets by mouth every 12 hours for 1 day total Patient taking differently: Take 1,000 mg by mouth See admin instructions. Take 1,000 mg by mouth every  twelve hours as directed for outbreaks of cold blisters 07/02/20  Yes [name], [place]  vitamin E 180 MG (400 UNITS) capsule Take 400 Units by mouth daily.   Yes [provider]  estradiol (ESTRACE) 0.1 MG/GM vaginal cream Estrogen Cream Instruction Discard applicator Apply pea sized amount to tip of finger to urethra before bed. Wash hands well after application. Use Monday, Wednesday and Friday Patient not taking: Reported on 04/18/2021 01/09/21   Billey Co, MD  furosemide (LASIX) 20 MG tablet Take 1 tablet (20 mg total) by mouth daily. TAKE 1 TABLET BY MOUTH DAILY FOR 14 DAYS. Patient not taking: Reported on 04/18/2021 03/25/21   [name], MD    Physical Exam: Vitals:   04/18/21 1245 04/18/21 1300 04/18/21 1321 04/18/21 1415  BP: (!) 157/75 (!) 157/79  (!) 164/90  Pulse: 63 65  71  Resp: (!) 21 19  (!) 23  Temp:      TempSrc:      SpO2: 99% 100%  100%  Weight:   72.3 kg   Height:   5' 3"  (1.6 m)      Constitutional: NAD, calm, comfortable Vitals:   04/18/21 1245 04/18/21 1300 04/18/21 1321 04/18/21 1415  BP: (!) 157/75 (!) 157/79  (!) 164/90  Pulse: 63 65  71  Resp: (!) 21 19  (!) 23  Temp:      TempSrc:      SpO2: 99% 100%  100%  Weight:   72.3 kg   Height:   5' 3"  (1.6 m)    Eyes: PERRL, lids and conjunctivae normal ENMT: Mucous membranes are moist. Posterior pharynx clear of any exudate or lesions.Normal dentition.  Neck: normal, supple, no masses, no thyromegaly Respiratory: Diminished breathing sound bilaterally to mid level, no wheezing, no crackles.  Increasing respiratory effort, talking in broken sentences. No accessory muscle use.  Cardiovascular: Regular rate and rhythm, positive systolic rubs on parasternal area. No extremity edema. 2+ pedal pulses. No carotid bruits.  Abdomen: no tenderness, no masses palpated. No hepatosplenomegaly. Bowel sounds positive.  Musculoskeletal: no clubbing / cyanosis. No joint deformity upper and lower extremities. Good ROM, no contractures. Normal muscle tone.  Skin: no rashes, lesions, ulcers. No induration Neurologic: CN 2-12 grossly intact. Sensation intact, DTR normal. Strength 5/5 in all 4.  Psychiatric: Normal judgment and insight. Alert and oriented x 3. Normal mood.     Labs on Admission: I have personally reviewed following labs and imaging studies  CBC: Recent Labs  Lab 04/18/21 1257  WBC 5.6  NEUTROABS 4.5  HGB 10.7*  HCT 32.6*  MCV 95.9  PLT 850   Basic Metabolic Panel: Recent Labs  Lab 04/18/21 1257  NA 137  K 4.1  CL 106  CO2 25  GLUCOSE 107*  BUN 13  CREATININE 0.76  CALCIUM 9.1   GFR: Estimated Creatinine Clearance: 57.9 mL/min (by C-G formula based on SCr of 0.76 mg/dL). Liver Function Tests: No results for input(s): AST, ALT, ALKPHOS, BILITOT, PROT, ALBUMIN in the last 168 hours. No results for input(s): LIPASE, AMYLASE in the last 168 hours. No results for input(s): AMMONIA in the last 168  hours. Coagulation Profile: No results for input(s): INR, PROTIME in the last 168 hours. Cardiac Enzymes: No results for input(s): CKTOTAL, CKMB, CKMBINDEX, TROPONINI in the last 168 hours. BNP (last 3 results) No results for input(s): PROBNP in the last 8760 hours. HbA1C: No results for input(s): HGBA1C in the last 72 hours. CBG: No results  for input(s): GLUCAP in the last 168 hours. Lipid Profile: No results for input(s): CHOL, HDL, LDLCALC, TRIG, CHOLHDL, LDLDIRECT in the last 72 hours. Thyroid Function Tests: No results for input(s): TSH, T4TOTAL, FREET4, T3FREE, THYROIDAB in the last 72 hours. Anemia Panel: No results for input(s): VITAMINB12, FOLATE, FERRITIN, TIBC, IRON, RETICCTPCT in the last 72 hours. Urine analysis:    Component Value Date/Time   COLORURINE YELLOW 06/28/2020 1641   APPEARANCEUR Clear 01/09/2021 1114   LABSPEC 1.012 06/28/2020 1641   PHURINE 6.5 06/28/2020 1641   GLUCOSEU Negative 01/09/2021 1114   GLUCOSEU NEGATIVE 05/01/2020 1405   HGBUR NEGATIVE 06/28/2020 1641   BILIRUBINUR 1+ 02/13/2021 0741   BILIRUBINUR Negative 01/09/2021 1114   KETONESUR NEGATIVE 06/28/2020 1641   PROTEINUR Positive (A) 02/13/2021 0741   PROTEINUR Negative 01/09/2021 1114   PROTEINUR NEGATIVE 06/28/2020 1641   UROBILINOGEN 1.0 02/13/2021 0741   UROBILINOGEN 1.0 05/01/2020 1405   NITRITE negative 02/13/2021 0741   NITRITE Negative 01/09/2021 1114   NITRITE NEGATIVE 06/28/2020 1641   LEUKOCYTESUR Negative 02/13/2021 0741   LEUKOCYTESUR Negative 01/09/2021 1114   LEUKOCYTESUR 3+ (A) 06/28/2020 1641    Radiological Exams on Admission: DG Chest 2 View  Result Date: 04/18/2021 CLINICAL DATA:  Shortness of breath over the last several days. EXAM: CHEST - 2 VIEW COMPARISON:  05/01/2020 FINDINGS: The cardiac silhouette is enlarged which could be due to cardiomegaly and/or pericardial fluid. Chronic aortic atherosclerotic calcification. Bilateral pleural effusions, larger on the  right than the left, with dependent atelectasis in the lower lungs. Lung apices are clear. IMPRESSION: Enlarged cardiac silhouette. Bilateral effusions, right larger than left, with volume loss in the lower lobes, right worse than left. Findings most consistent with congestive heart failure. Coexistent pneumonia not excluded. Electronically Signed   By: Nelson Chimes M.D.   On: 04/18/2021 12:36    EKG: Independently reviewed.  Low voltage EKG  Assessment/Plan Active Problems:   Acute on chronic diastolic CHF (congestive heart failure) (HCC)   CHF (congestive heart failure) ([place])  (please populate well all problems here in Problem List. (For example, if patient is on BP meds at home and you resume or decide to hold them, it is a problem that needs to be her. Same for CAD, COPD, HLD and so on)  Acute on chronic diastolic CHF decompensation -Secondary to increasing pericardial effusion and uncontrolled hypertension. -Discussed with on-call cardiology, who will evaluate patient this afternoon.  1 dose of Lasix given in the ED. -Probably can still try Lasix regimen as patient's initial Lasix treatment was not completed due to unexpected diarrhea.  Worsening of chronic pericardial effusion -As above.  Bilateral pleural effusion right> left -CT scan at [facility] in March 2022 and PET scan in May at [facility] shown minimum right sided pleural effusion, on contrast, today's x-ray showed at least a moderate amount of pleural effusion and on physical exam there is large amount of pleural effusion on the right side. -Ordered CT without contrast to further characterize and consider IR for thoracentesis.  Uncontrolled hypertension -Start as needed hydralazine -Continue ARB  Pulmonary fibrosis -Recent PET scan showed a stabilized picture.  Thymoma -Status post radiation and chemotherapy, follow-up with oncology at [facility].  DVT prophylaxis: Lovenox Code Status: Full code Family Communication:  Husband at bedside Disposition Plan: Expect more than 2 midnight hospital stay to treat considering CHF and pericardial effusion and pleural effusion. Consults called: Cardiology Admission status: Telemetry admission   Lequita Halt MD [facility]  Pager [phone]  04/18/2021, 3:25 PM

## 2021-04-19 ENCOUNTER — Encounter (HOSPITAL_COMMUNITY)
Admission: EM | Disposition: A | Discharge status: Home / Self Care | Payer: Self-pay | Source: Home / Self Care | Attending: Internal Medicine

## 2021-04-19 ENCOUNTER — Inpatient Hospital Stay (HOSPITAL_COMMUNITY): Payer: Medicare Other | Admitting: Anesthesiology

## 2021-04-19 ENCOUNTER — Inpatient Hospital Stay (HOSPITAL_COMMUNITY): Payer: Medicare Other

## 2021-04-19 ENCOUNTER — Encounter (HOSPITAL_COMMUNITY): Payer: Self-pay | Admitting: Internal Medicine

## 2021-04-19 DIAGNOSIS — I313 Pericardial effusion (noninflammatory): Secondary | ICD-10-CM

## 2021-04-19 DIAGNOSIS — J90 Pleural effusion, not elsewhere classified: Secondary | ICD-10-CM

## 2021-04-19 HISTORY — PX: INTERCOSTAL NERVE BLOCK: SHX5021

## 2021-04-19 LAB — BASIC METABOLIC PANEL
Anion gap: 8 (ref 5–15)
BUN: 12 mg/dL (ref 8–23)
CO2: 29 mmol/L (ref 22–32)
Calcium: 9.3 mg/dL (ref 8.9–10.3)
Chloride: 102 mmol/L (ref 98–111)
Creatinine, Ser: 0.83 mg/dL (ref 0.44–1.00)
GFR, Estimated: >60 mL/min (ref >60)
Glucose, Bld: 104 mg/dL — ABNORMAL HIGH (ref 70–99)
Potassium: 3.5 mmol/L (ref 3.5–5.1)
Sodium: 139 mmol/L (ref 135–145)

## 2021-04-19 LAB — SURGICAL PCR SCREEN
MRSA, PCR: POSITIVE — AB
Staphylococcus aureus: POSITIVE — AB

## 2021-04-19 SURGERY — THORACOSCOPY, ROBOT-ASSISTED
Anesthesia: General | Site: Chest | Laterality: Right

## 2021-04-19 MED ORDER — TRAMADOL HCL 50 MG PO TABS
50.0000 mg | ORAL_TABLET | Freq: Four times a day (QID) | ORAL | Status: DC | PRN
  Administered 2021-04-19 – 2021-04-20 (×2): 100 mg via ORAL
  Filled 2021-04-19 (×3): qty 2

## 2021-04-19 MED ORDER — OXYCODONE HCL 5 MG PO TABS: 5.0000 mg | ORAL_TABLET | Freq: Once | ORAL | Status: DC | PRN

## 2021-04-19 MED ORDER — LACTATED RINGERS IV SOLN: INTRAVENOUS | Status: DC | PRN

## 2021-04-19 MED ORDER — LIDOCAINE 2% (20 MG/ML) 5 ML SYRINGE
INTRAMUSCULAR | Status: AC
  Filled 2021-04-19: qty 5

## 2021-04-19 MED ORDER — KETOROLAC TROMETHAMINE 15 MG/ML IJ SOLN
INTRAMUSCULAR | Status: AC
  Administered 2021-04-19: 15 mg via INTRAVENOUS
  Filled 2021-04-19: qty 1

## 2021-04-19 MED ORDER — CEFAZOLIN SODIUM-DEXTROSE 2-3 GM-%(50ML) IV SOLR
INTRAVENOUS | Status: DC | PRN
  Administered 2021-04-19: 2 g via INTRAVENOUS

## 2021-04-19 MED ORDER — ONDANSETRON HCL 4 MG/2ML IJ SOLN
INTRAMUSCULAR | Status: DC | PRN
  Administered 2021-04-19: 4 mg via INTRAVENOUS

## 2021-04-19 MED ORDER — SODIUM CHLORIDE FLUSH 0.9 % IV SOLN
INTRAVENOUS | Status: DC | PRN
  Administered 2021-04-19: 100 mL

## 2021-04-19 MED ORDER — BUPIVACAINE HCL (PF) 0.5 % IJ SOLN
INTRAMUSCULAR | Status: AC
  Filled 2021-04-19: qty 30

## 2021-04-19 MED ORDER — SUGAMMADEX SODIUM 200 MG/2ML IV SOLN
INTRAVENOUS | Status: DC | PRN
  Administered 2021-04-19: 200 mg via INTRAVENOUS

## 2021-04-19 MED ORDER — SENNOSIDES-DOCUSATE SODIUM 8.6-50 MG PO TABS
1.0000 | ORAL_TABLET | Freq: Every day | ORAL | Status: DC
  Administered 2021-04-19: 1 via ORAL
  Filled 2021-04-19: qty 1

## 2021-04-19 MED ORDER — ROCURONIUM BROMIDE 10 MG/ML (PF) SYRINGE
PREFILLED_SYRINGE | INTRAVENOUS | Status: AC
  Filled 2021-04-19: qty 20

## 2021-04-19 MED ORDER — ORAL CARE MOUTH RINSE: 15.0000 mL | Freq: Once | OROMUCOSAL | Status: AC

## 2021-04-19 MED ORDER — BUPIVACAINE LIPOSOME 1.3 % IJ SUSP
INTRAMUSCULAR | Status: AC
  Filled 2021-04-19: qty 20

## 2021-04-19 MED ORDER — ONDANSETRON HCL 4 MG/2ML IJ SOLN
4.0000 mg | Freq: Once | INTRAMUSCULAR | Status: AC | PRN
  Administered 2021-04-19: 4 mg via INTRAVENOUS

## 2021-04-19 MED ORDER — KETOROLAC TROMETHAMINE 15 MG/ML IJ SOLN
15.0000 mg | Freq: Four times a day (QID) | INTRAMUSCULAR | Status: DC | PRN
  Administered 2021-04-20 (×2): 15 mg via INTRAVENOUS
  Filled 2021-04-19 (×2): qty 1

## 2021-04-19 MED ORDER — ONDANSETRON HCL 4 MG/2ML IJ SOLN: 4.0000 mg | Freq: Four times a day (QID) | INTRAMUSCULAR | Status: DC | PRN

## 2021-04-19 MED ORDER — PROPOFOL 10 MG/ML IV BOLUS
INTRAVENOUS | Status: DC | PRN
  Administered 2021-04-19: 90 mg via INTRAVENOUS

## 2021-04-19 MED ORDER — PHENYLEPHRINE 40 MCG/ML (10ML) SYRINGE FOR IV PUSH (FOR BLOOD PRESSURE SUPPORT)
PREFILLED_SYRINGE | INTRAVENOUS | Status: DC | PRN
  Administered 2021-04-19: 80 ug via INTRAVENOUS

## 2021-04-19 MED ORDER — ACETAMINOPHEN 10 MG/ML IV SOLN: 1000.0000 mg | Freq: Once | INTRAVENOUS | Status: DC | PRN

## 2021-04-19 MED ORDER — FENTANYL CITRATE (PF) 250 MCG/5ML IJ SOLN
INTRAMUSCULAR | Status: AC
  Filled 2021-04-19: qty 5

## 2021-04-19 MED ORDER — LIDOCAINE 2% (20 MG/ML) 5 ML SYRINGE
INTRAMUSCULAR | Status: DC | PRN
  Administered 2021-04-19: 40 mg via INTRAVENOUS

## 2021-04-19 MED ORDER — CHLORHEXIDINE GLUCONATE 0.12 % MT SOLN: 15.0000 mL | Freq: Once | OROMUCOSAL | Status: AC

## 2021-04-19 MED ORDER — 0.9 % SODIUM CHLORIDE (POUR BTL) OPTIME
TOPICAL | Status: DC | PRN
  Administered 2021-04-19: 2000 mL

## 2021-04-19 MED ORDER — ACETAMINOPHEN 500 MG PO TABS
1000.0000 mg | ORAL_TABLET | Freq: Four times a day (QID) | ORAL | Status: DC
  Administered 2021-04-19 – 2021-04-22 (×9): 1000 mg via ORAL
  Filled 2021-04-19 (×10): qty 2

## 2021-04-19 MED ORDER — OXYCODONE HCL 5 MG/5ML PO SOLN: 5.0000 mg | Freq: Once | ORAL | Status: DC | PRN

## 2021-04-19 MED ORDER — ACETAMINOPHEN 160 MG/5ML PO SOLN: 1000.0000 mg | Freq: Four times a day (QID) | ORAL | Status: DC

## 2021-04-19 MED ORDER — EPHEDRINE SULFATE-NACL 50-0.9 MG/10ML-% IV SOSY
PREFILLED_SYRINGE | INTRAVENOUS | Status: DC | PRN
  Administered 2021-04-19 (×2): 5 mg via INTRAVENOUS

## 2021-04-19 MED ORDER — LACTATED RINGERS IV SOLN: INTRAVENOUS | Status: DC

## 2021-04-19 MED ORDER — HEMOSTATIC AGENTS (NO CHARGE) OPTIME
TOPICAL | Status: DC | PRN
  Administered 2021-04-19: 1 via TOPICAL

## 2021-04-19 MED ORDER — DEXAMETHASONE SODIUM PHOSPHATE 10 MG/ML IJ SOLN
INTRAMUSCULAR | Status: DC | PRN
  Administered 2021-04-19: 10 mg via INTRAVENOUS

## 2021-04-19 MED ORDER — CEFAZOLIN SODIUM-DEXTROSE 2-4 GM/100ML-% IV SOLN
2.0000 g | Freq: Three times a day (TID) | INTRAVENOUS | Status: AC
  Administered 2021-04-19 – 2021-04-20 (×2): 2 g via INTRAVENOUS
  Filled 2021-04-19 (×2): qty 100

## 2021-04-19 MED ORDER — HYDROMORPHONE HCL 1 MG/ML IJ SOLN
INTRAMUSCULAR | Status: AC
  Filled 2021-04-19: qty 1

## 2021-04-19 MED ORDER — BISACODYL 5 MG PO TBEC
10.0000 mg | DELAYED_RELEASE_TABLET | Freq: Every day | ORAL | Status: DC
  Administered 2021-04-20: 10 mg via ORAL
  Filled 2021-04-19 (×2): qty 2

## 2021-04-19 MED ORDER — ACETAMINOPHEN 10 MG/ML IV SOLN
INTRAVENOUS | Status: AC
  Administered 2021-04-19: 1000 mg via INTRAVENOUS
  Filled 2021-04-19: qty 100

## 2021-04-19 MED ORDER — HYDROMORPHONE HCL 1 MG/ML IJ SOLN
0.2500 mg | INTRAMUSCULAR | Status: DC | PRN
  Administered 2021-04-19 (×2): 0.5 mg via INTRAVENOUS

## 2021-04-19 MED ORDER — FENTANYL CITRATE (PF) 250 MCG/5ML IJ SOLN
INTRAMUSCULAR | Status: DC | PRN
  Administered 2021-04-19: 100 ug via INTRAVENOUS
  Administered 2021-04-19: 50 ug via INTRAVENOUS

## 2021-04-19 MED ORDER — CHLORHEXIDINE GLUCONATE 0.12 % MT SOLN
OROMUCOSAL | Status: AC
  Administered 2021-04-19: 15 mL via OROMUCOSAL
  Filled 2021-04-19: qty 15

## 2021-04-19 MED ORDER — ROCURONIUM BROMIDE 10 MG/ML (PF) SYRINGE
PREFILLED_SYRINGE | INTRAVENOUS | Status: DC | PRN
  Administered 2021-04-19: 60 mg via INTRAVENOUS

## 2021-04-19 MED ORDER — HYDROMORPHONE HCL 1 MG/ML IJ SOLN
INTRAMUSCULAR | Status: AC
  Administered 2021-04-19: 0.5 mg via INTRAVENOUS
  Filled 2021-04-19: qty 1

## 2021-04-19 MED ORDER — MUPIROCIN 2 % EX OINT
1.0000 "application " | TOPICAL_OINTMENT | Freq: Two times a day (BID) | CUTANEOUS | Status: AC
  Administered 2021-04-19 – 2021-04-23 (×8): 1 via NASAL
  Filled 2021-04-19 (×2): qty 22

## 2021-04-19 MED ORDER — ONDANSETRON HCL 4 MG/2ML IJ SOLN
INTRAMUSCULAR | Status: AC
  Filled 2021-04-19: qty 2

## 2021-04-19 MED ORDER — CHLORHEXIDINE GLUCONATE CLOTH 2 % EX PADS
6.0000 | MEDICATED_PAD | Freq: Every day | CUTANEOUS | Status: AC
  Administered 2021-04-19 – 2021-04-24 (×5): 6 via TOPICAL

## 2021-04-19 SURGICAL SUPPLY — 116 items
BLADE CLIPPER SURG (BLADE) ×4 IMPLANT
BLADE SURG 11 STRL SS (BLADE) ×4 IMPLANT
BNDG COHESIVE 6X5 TAN STRL LF (GAUZE/BANDAGES/DRESSINGS) ×4 IMPLANT
CANISTER SUCT 3000ML PPV (MISCELLANEOUS) ×8 IMPLANT
CANNULA REDUC XI 12-8 STAPL (CANNULA) ×2
CANNULA REDUCER 12-8 DVNC XI (CANNULA) ×6 IMPLANT
CATH THORACIC 28FR (CATHETERS) IMPLANT
CATH THORACIC 28FR RT ANG (CATHETERS) IMPLANT
CATH THORACIC 36FR (CATHETERS) IMPLANT
CATH THORACIC 36FR RT ANG (CATHETERS) IMPLANT
CATH TROCAR 20FR (CATHETERS) IMPLANT
CHLORAPREP W/TINT 26 (MISCELLANEOUS) ×4 IMPLANT
CLIP VESOCCLUDE MED 6/CT (CLIP) IMPLANT
CNTNR URN SCR LID CUP LEK RST (MISCELLANEOUS) ×6 IMPLANT
CONN ST 1/4X3/8  BEN (MISCELLANEOUS)
CONN ST 1/4X3/8 BEN (MISCELLANEOUS) IMPLANT
CONT SPEC 4OZ STRL OR WHT (MISCELLANEOUS) ×2
COVER SURGICAL LIGHT HANDLE (MISCELLANEOUS) IMPLANT
DEFOGGER SCOPE WARMER CLEARIFY (MISCELLANEOUS) ×4 IMPLANT
DERMABOND ADVANCED (GAUZE/BANDAGES/DRESSINGS) ×1
DERMABOND ADVANCED .7 DNX12 (GAUZE/BANDAGES/DRESSINGS) ×3 IMPLANT
DRAIN CHANNEL 19F RND (DRAIN) ×4 IMPLANT
DRAIN CHANNEL 28F RND 3/8 FF (WOUND CARE) IMPLANT
DRAIN CHANNEL 32F RND 10.7 FF (WOUND CARE) IMPLANT
DRAIN CONNECTOR BLAKE 1:1 (MISCELLANEOUS) ×4 IMPLANT
DRAPE ARM DVNC X/XI (DISPOSABLE) ×12 IMPLANT
DRAPE COLUMN DVNC XI (DISPOSABLE) ×3 IMPLANT
DRAPE CV SPLIT W-CLR ANES SCRN (DRAPES) ×4 IMPLANT
DRAPE DA VINCI XI ARM (DISPOSABLE) ×4
DRAPE DA VINCI XI COLUMN (DISPOSABLE) ×1
DRAPE INCISE IOBAN 66X45 STRL (DRAPES) ×4 IMPLANT
DRAPE ORTHO SPLIT 77X108 STRL (DRAPES) ×1
DRAPE SURG ORHT 6 SPLT 77X108 (DRAPES) ×3 IMPLANT
DRAPE WARM FLUID 44X44 (DRAPES) ×4 IMPLANT
ELECT BLADE 6.5 EXT (BLADE) IMPLANT
ELECT REM PT RETURN 9FT ADLT (ELECTROSURGICAL) ×4
ELECTRODE REM PT RTRN 9FT ADLT (ELECTROSURGICAL) ×3 IMPLANT
GAUZE KITTNER 4X5 RF (MISCELLANEOUS) ×4 IMPLANT
GAUZE SPONGE 4X4 12PLY STRL (GAUZE/BANDAGES/DRESSINGS) ×4 IMPLANT
GLOVE SURG ENC MOIS LTX SZ7.5 (GLOVE) ×8 IMPLANT
GLOVE SURG UNDER POLY LF SZ6.5 (GLOVE) ×16 IMPLANT
GOWN STRL REUS W/ TWL LRG LVL3 (GOWN DISPOSABLE) ×6 IMPLANT
GOWN STRL REUS W/ TWL XL LVL3 (GOWN DISPOSABLE) ×9 IMPLANT
GOWN STRL REUS W/TWL 2XL LVL3 (GOWN DISPOSABLE) ×4 IMPLANT
GOWN STRL REUS W/TWL LRG LVL3 (GOWN DISPOSABLE) ×2
GOWN STRL REUS W/TWL XL LVL3 (GOWN DISPOSABLE) ×3
HEMOSTAT SURGICEL .5X2 ABSORB (HEMOSTASIS) ×4 IMPLANT
HEMOSTAT SURGICEL 2X14 (HEMOSTASIS) IMPLANT
IRRIGATION STRYKERFLOW (MISCELLANEOUS) ×3 IMPLANT
IRRIGATOR STRYKERFLOW (MISCELLANEOUS) ×4
IRRIGATOR SUCT 8 DISP DVNC XI (IRRIGATION / IRRIGATOR) ×3 IMPLANT
IRRIGATOR SUCTION 8MM XI DISP (IRRIGATION / IRRIGATOR) ×1
KIT BASIN OR (CUSTOM PROCEDURE TRAY) ×4 IMPLANT
KIT SUCTION CATH 14FR (SUCTIONS) IMPLANT
KIT TURNOVER KIT B (KITS) ×4 IMPLANT
LOOP VESSEL SUPERMAXI WHITE (MISCELLANEOUS) IMPLANT
NEEDLE 22X1 1/2 (OR ONLY) (NEEDLE) ×4 IMPLANT
NEEDLE HYPO 25GX1X1/2 BEV (NEEDLE) ×4 IMPLANT
NS IRRIG 1000ML POUR BTL (IV SOLUTION) ×12 IMPLANT
OBTURATOR OPTICAL STANDARD 8MM (TROCAR)
OBTURATOR OPTICAL STND 8 DVNC (TROCAR)
OBTURATOR OPTICALSTD 8 DVNC (TROCAR) IMPLANT
PACK CHEST (CUSTOM PROCEDURE TRAY) ×4 IMPLANT
PACK UNIVERSAL I (CUSTOM PROCEDURE TRAY) ×4 IMPLANT
PAD ARMBOARD 7.5X6 YLW CONV (MISCELLANEOUS) ×20 IMPLANT
PORT ACCESS TROCAR AIRSEAL 12 (TROCAR) ×3 IMPLANT
PORT ACCESS TROCAR AIRSEAL 5M (TROCAR) ×1
RTRCTR WOUND ALEXIS 18CM SML (INSTRUMENTS) ×4
SAVER CELL AAL HAEMONETICS (INSTRUMENTS) ×3 IMPLANT
SEAL CANN UNIV 5-8 DVNC XI (MISCELLANEOUS) ×9 IMPLANT
SEAL XI 5MM-8MM UNIVERSAL (MISCELLANEOUS) ×3
SEALANT PROGEL (MISCELLANEOUS) IMPLANT
SEALANT SURG COSEAL 4ML (VASCULAR PRODUCTS) IMPLANT
SEALANT SURG COSEAL 8ML (VASCULAR PRODUCTS) IMPLANT
SET BERKELEY SUCTION TUBING (SUCTIONS) ×4 IMPLANT
SET TRI-LUMEN FLTR TB AIRSEAL (TUBING) ×8 IMPLANT
SOLUTION ELECTROLUBE (MISCELLANEOUS) ×4 IMPLANT
SPONGE INTESTINAL PEANUT (DISPOSABLE) IMPLANT
SPONGE T-LAP 18X18 ~~LOC~~+RFID (SPONGE) ×8 IMPLANT
STAPLER CANNULA SEAL DVNC XI (STAPLE) ×6 IMPLANT
STAPLER CANNULA SEAL XI (STAPLE) ×2
STOPCOCK 4 WAY LG BORE MALE ST (IV SETS) ×4 IMPLANT
SUT MON AB 2-0 CT1 36 (SUTURE) IMPLANT
SUT PDS AB 1 CTX 36 (SUTURE) IMPLANT
SUT PROLENE 4 0 RB 1 (SUTURE)
SUT PROLENE 4-0 RB1 .5 CRCL 36 (SUTURE) IMPLANT
SUT SILK  1 MH (SUTURE) ×1
SUT SILK 1 MH (SUTURE) ×3 IMPLANT
SUT SILK 1 TIES 10X30 (SUTURE) IMPLANT
SUT SILK 2 0 SH (SUTURE) IMPLANT
SUT SILK 2 0SH CR/8 30 (SUTURE) IMPLANT
SUT VIC AB 1 CTX 36 (SUTURE)
SUT VIC AB 1 CTX36XBRD ANBCTR (SUTURE) IMPLANT
SUT VIC AB 2-0 CT1 27 (SUTURE) ×1
SUT VIC AB 2-0 CT1 TAPERPNT 27 (SUTURE) ×3 IMPLANT
SUT VIC AB 3-0 SH 27 (SUTURE) ×3
SUT VIC AB 3-0 SH 27X BRD (SUTURE) ×9 IMPLANT
SUT VICRYL 0 TIES 12 18 (SUTURE) ×4 IMPLANT
SUT VICRYL 0 UR6 27IN ABS (SUTURE) ×16 IMPLANT
SUT VICRYL 2 TP 1 (SUTURE) IMPLANT
SYR 10ML LL (SYRINGE) ×4 IMPLANT
SYR 20ML LL LF (SYRINGE) ×4 IMPLANT
SYR 50ML LL SCALE MARK (SYRINGE) ×4 IMPLANT
SYSTEM SAHARA CHEST DRAIN ATS (WOUND CARE) ×8 IMPLANT
TAPE CLOTH 4X10 WHT NS (GAUZE/BANDAGES/DRESSINGS) ×4 IMPLANT
TAPE CLOTH SOFT 2X10 (GAUZE/BANDAGES/DRESSINGS) ×4 IMPLANT
TIP APPLICATOR SPRAY EXTEND 16 (VASCULAR PRODUCTS) IMPLANT
TOWEL GREEN STERILE (TOWEL DISPOSABLE) ×4 IMPLANT
TRAP SPECIMEN MUCUS 40CC (MISCELLANEOUS) ×4 IMPLANT
TRAY CATH INTERMITTENT SS 16FR (CATHETERS) ×4 IMPLANT
TRAY FOL W/BAG SLVR 16FR STRL (SET/KITS/TRAYS/PACK) ×3 IMPLANT
TRAY FOLEY MTR SLVR 16FR STAT (SET/KITS/TRAYS/PACK) ×4 IMPLANT
TRAY FOLEY W/BAG SLVR 16FR LF (SET/KITS/TRAYS/PACK) ×1
TROCAR BLADELESS 15MM (ENDOMECHANICALS) IMPLANT
TUBING EXTENTION W/L.L. (IV SETS) ×4 IMPLANT
WATER STERILE IRR 1000ML POUR (IV SOLUTION) ×4 IMPLANT

## 2021-04-19 NOTE — Anesthesia Postprocedure Evaluation (Signed)
Anesthesia Post Note  Patient: Autumn Conley  Procedure(s) Performed: attempted XI ROBOTIC ASSISTED THORASCOPY (Right: Chest) INTERCOSTAL NERVE BLOCK (Right: Chest) PERICARDIAL WINDOW WITH THORACOTOMY APPROACH (Chest)     Patient location during evaluation: PACU Anesthesia Type: General Level of consciousness: awake Pain management: pain level controlled Vital Signs Assessment: post-procedure vital signs reviewed and stable Respiratory status: spontaneous breathing Cardiovascular status: stable Postop Assessment: no apparent nausea or vomiting Anesthetic complications: no   No notable events documented.  Last Vitals:  Vitals:   04/19/21 1708 04/19/21 1723  BP: 103/60 108/61  Pulse: 62 63  Resp: 18 12  Temp:    SpO2: 95% 93%    Last Pain:  Vitals:   04/19/21 1708  TempSrc:   PainSc: 10-Worst pain ever                 Benjamen Koelling

## 2021-04-19 NOTE — Brief Op Note (Signed)
04/18/2021 - 04/19/2021  9:30 AM  PATIENT:  Autumn Conley  75 y.o. female  PRE-OPERATIVE DIAGNOSIS:  PERICARDIAL EFFUSION  POST-OPERATIVE DIAGNOSIS:  PERICARDIAL EFFUSION  PROCEDURE:  Procedure(s):  XI ROBOTIC ASSISTED THORASCOPY (Right) -Attempted, aborted due to mechanical ventilation issues -Placement of 28 straight chest tube  LEFT PERISTERNAL MINI THORACOTOMY -Creation of Pericardial Window  INTERCOSTAL NERVE BLOCK  SURGEON:  Surgeon(s) and Role:    * Lightfoot, Lucile Crater, MD - Primary  PHYSICIAN ASSISTANT: Ellwood Handler PA-C, Nicholes Rough PA-C  ANESTHESIA:   general  EBL:  100 mL   BLOOD ADMINISTERED:none  DRAINS:  28 blake drain, pericardial drain    LOCAL MEDICATIONS USED:  BUPIVICAINE   SPECIMEN:  Source of Specimen:  pericardial fluid  DISPOSITION OF SPECIMEN:   cytology  COUNTS:  YES  TOURNIQUET:  * No tourniquets in log *  DICTATION: .Dragon Dictation  PLAN OF CARE:  patient has active inpatient order  PATIENT DISPOSITION:  PACU - hemodynamically stable.   Delay start of Pharmacological VTE agent (>24hrs) due to surgical blood loss or risk of bleeding: no

## 2021-04-19 NOTE — Transfer of Care (Signed)
Immediate Anesthesia Transfer of Care Note  Patient: Autumn Conley  Procedure(s) Performed: attempted XI ROBOTIC ASSISTED THORASCOPY (Right: Chest) INTERCOSTAL NERVE BLOCK (Right: Chest) PERICARDIAL WINDOW WITH THORACOTOMY APPROACH (Chest)  Patient Location: PACU  Anesthesia Type:General  Level of Consciousness: drowsy and patient cooperative  Airway & Oxygen Therapy: Patient Spontanous Breathing and Patient connected to face mask oxygen  Post-op Assessment: Report given to RN and Post -op Vital signs reviewed and stable  Post vital signs: Reviewed and stable  Last Vitals:  Vitals Value Taken Time  BP 104/56 04/19/21 1653  Temp    Pulse 63 04/19/21 1703  Resp 20 04/19/21 1703  SpO2 97 % 04/19/21 1703  Vitals shown include unvalidated device data.  Last Pain:  Vitals:   04/19/21 1210  TempSrc:   PainSc: 0-No pain      Patients Stated Pain Goal: 0 (33/82/50 5397)  Complications: No notable events documented.

## 2021-04-19 NOTE — Anesthesia Preprocedure Evaluation (Addendum)
Anesthesia Evaluation  Patient identified by MRN, date of birth, ID band Patient awake    Reviewed: Allergy & Precautions, NPO status , Patient's Chart, lab work & pertinent test results  History of Anesthesia Complications (+) PONV and history of anesthetic complications  Airway Mallampati: II  TM Distance: >3 FB Neck ROM: Full    Dental no notable dental hx.    Pulmonary shortness of breath and with exertion, sleep apnea ,    Pulmonary exam normal breath sounds clear to auscultation       Cardiovascular hypertension, Pt. on medications +CHF (grade 1 diastolic dysfunction)  + Valvular Problems/Murmurs (mild AS, mild AI) AI and AS  Rhythm:Regular Rate:Normal + Systolic murmurs Echo 3/55/97: 1. There is a large circumferential pericardial effusion that measures up  to 2.7 cm posterior to the LV. There is no RA/RV collapse in diastole.  There is ~30% variation in MV inflow. The IVC is dilated with <50%  collapse, but there is diastolic foward  flow in the hepatic veins. When compared with the prior study, the  effusion is larger and the IVC is now dilated. These changes suggest the  effusion is becoming significant. Would recommend clinical correlation for  tamponade. Large pericardial effusion.  The pericardial effusion is circumferential.  2. Left ventricular ejection fraction, by estimation, is 60 to 65%. The  left ventricle has normal function. The left ventricle has no regional  wall motion abnormalities. There is moderate concentric left ventricular  hypertrophy. Left ventricular  diastolic parameters are consistent with Grade I diastolic dysfunction  (impaired relaxation).  3. Right ventricular systolic function is normal. The right ventricular  size is normal. Tricuspid regurgitation signal is inadequate for assessing  PA pressure.  4. The mitral valve is degenerative. No evidence of mitral valve  regurgitation. No  evidence of mitral stenosis. Moderate to severe mitral  annular calcification.  5. The aortic valve is tricuspid. There is moderate calcification of the  aortic valve. There is moderate thickening of the aortic valve. Aortic  valve regurgitation is mild. Mild aortic valve stenosis. Aortic valve  area, by VTI measures 1.71 cm. Aortic  valve mean gradient measures 10.0 mmHg. Aortic valve Vmax measures 2.06  m/s.  6. The inferior vena cava is dilated in size with >50% respiratory  variability, suggesting right atrial pressure of 8 mmHg.   Comparison(s): Changes from prior study are noted. Effusion is larger on  this study. IVC now dilated.    Neuro/Psych  Headaches, PSYCHIATRIC DISORDERS Anxiety Depression CVA negative neurological ROS  negative psych ROS   GI/Hepatic PUD, GERD  Medicated and Controlled,(+) Hepatitis -  Endo/Other  negative endocrine ROS  Renal/GU negative Renal ROS  negative genitourinary   Musculoskeletal negative musculoskeletal ROS (+) Arthritis , Fibromyalgia -  Abdominal   Peds negative pediatric ROS (+)  Hematology  (+) anemia ,   Anesthesia Other Findings   Reproductive/Obstetrics negative OB ROS                            Anesthesia Physical Anesthesia Plan  ASA: 3  Anesthesia Plan: General   Post-op Pain Management:    Induction: Intravenous  PONV Risk Score and Plan: 4 or greater and Ondansetron, Dexamethasone and Treatment may vary due to age or medical condition  Airway Management Planned: Double Lumen EBT  Additional Equipment:   Intra-op Plan:   Post-operative Plan: Extubation in OR  Informed Consent: I have reviewed the patients  History and Physical, chart, labs and discussed the procedure including the risks, benefits and alternatives for the proposed anesthesia with the patient or authorized representative who has indicated his/her understanding and acceptance.     Dental advisory given  Plan  Discussed with: CRNA and Surgeon  Anesthesia Plan Comments:         Anesthesia Quick Evaluation

## 2021-04-19 NOTE — Evaluation (Signed)
Physical Therapy Evaluation Patient Details Name: Autumn Conley MRN: [id number] DOB: 27-Nov-1945 Today's Date: 04/19/2021   History of Present Illness  Pt is a 75 y.o. female admitted 04/18/21 with SOB. CXR showed cardiomegaly and moderate amount of pleural effusion (R>L). Workup for acute on chronic CHF decompensation secondary to increasing pericardial effusion and uncontrolled HTN. Plan for pericardial window 7/1. 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   Clinical Impression  Pt presents with an overall decrease in functional mobility secondary to above. PTA, pt independent, lives with supportive spouse available to assist when pt fatigued. Today, pt independent with mobility and ADL tasks. Educ re: activity recommendations, importance of mobility. Pt scheduled for pericardial window this afternoon. Will plan to work with patient post-op to maximize functional mobility and independence.   SpO2 98% on 2L at rest SpO2 91-96% on RA with ambulation    Follow Up Recommendations No PT follow up (pending progress post-op)    Equipment Recommendations   (TBD post-op)    Recommendations for Other Services       Precautions / Restrictions Precautions Precautions: Other (comment) Precaution Comments: Watch SpO2 (does not wear O2 baseline) Restrictions Weight Bearing Restrictions: No      Mobility  Bed Mobility Overal bed mobility: Independent                  Transfers Overall transfer level: Independent Equipment used: None                Ambulation/Gait Ambulation/Gait assistance: Independent Gait Distance (Feet): 400 Feet Assistive device: None Gait Pattern/deviations: WFL(Within Functional Limits) Gait velocity: Decreased   General Gait Details: Slow, steady gait indep without DME; cues for pursed lip breathing and activity pacing, DOE up to 3/4, pt  conversant majority of walk  Stairs            Wheelchair Mobility    Modified Rankin (Stroke Patients Only)       Balance Overall balance assessment: No apparent balance deficits (not formally assessed)                                           Pertinent Vitals/Pain Pain Assessment: No/denies pain    Home Living Family/patient expects to be discharged to:: Private residence Living Arrangements: Spouse/significant other Available Help at Discharge: Family;Available 24 hours/day Type of Home: House Home Access: Stairs to enter Entrance Stairs-Rails: Left Entrance Stairs-Number of Steps: 3-4 Home Layout: One level Home Equipment: Shower seat;Cane - single point Additional Comments: patient and husband retired from work    Prior Function Level of Independence: Independent         Comments: Independent without; husband assists as needed if fatigued     Hand Dominance        Extremity/Trunk Assessment   Upper Extremity Assessment Upper Extremity Assessment: Overall WFL for tasks assessed    Lower Extremity Assessment Lower Extremity Assessment: Overall WFL for tasks assessed    Cervical / Trunk Assessment Cervical / Trunk Assessment: Normal  Communication   Communication: No difficulties  Cognition Arousal/Alertness: Awake/alert Behavior During Therapy: WFL for tasks assessed/performed Overall Cognitive Status: Within Functional Limits for tasks assessed  General Comments General comments (skin integrity, edema, etc.): Educ re: activity recommendations, importance of mobility, PT to see post-sx regarding any change in mobility status. Husband present and supportive    Exercises     Assessment/Plan    PT Assessment Patient needs continued PT services  PT Problem List Decreased mobility;Cardiopulmonary status limiting activity       PT Treatment Interventions DME  instruction;Gait training;Stair training;Functional mobility training;Therapeutic activities;Therapeutic exercise;Balance training;Patient/family education    PT Goals (Current goals can be found in the Care Plan section)  Acute Rehab PT Goals Patient Stated Goal: Improved breathing and energy, return home PT Goal Formulation: With patient Time For Goal Achievement: 05/03/21 Potential to Achieve Goals: Good    Frequency Min 3X/week   Barriers to discharge        Co-evaluation               AM-PAC PT "6 Clicks" Mobility  Outcome Measure Help needed turning from your back to your side while in a flat bed without using bedrails?: None Help needed moving from lying on your back to sitting on the side of a flat bed without using bedrails?: None Help needed moving to and from a bed to a chair (including a wheelchair)?: None Help needed standing up from a chair using your arms (e.g., wheelchair or bedside chair)?: None Help needed to walk in hospital room?: None Help needed climbing 3-5 steps with a railing? : None 6 Click Score: 24    End of Session Equipment Utilized During Treatment: Oxygen Activity Tolerance: Patient tolerated treatment well Patient left: in chair;with call bell/phone within reach;with family/visitor present Nurse Communication: Mobility status PT Visit Diagnosis: Other abnormalities of gait and mobility (R26.89)    Time: 2080-2233 PT Time Calculation (min) (ACUTE ONLY): 29 min   Charges:   PT Evaluation $PT Eval Moderate Complexity: 1 Mod PT Treatments $Self Care/Home Management: 8-22    Mabeline Caras, PT, DPT Acute Rehabilitation Services  Pager [phone] Office [place] 04/19/2021, 11:12 AM

## 2021-04-19 NOTE — Hospital Course (Signed)
HPI:   [name] is a 75 year old female with a history of malignant thymoma diagnosed in April 2020 and treated with chemotherapy /radiation.  She also has a history of hypertension, mitral valve prolapse, fibromyalgia, and Paget's disease of the left breast with subsequent left mastectomy in 2009.  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   [name] started having exertional dyspnea earlier this year and was referred to cardiology.  Work-up included an echocardiogram back in April of this year that demonstrated a moderate pericardial effusion.  She has been followed by the cardiology service with repeat echocardiograms every 6 weeks since then.  The most recent echo on June 6 showed a large pericardial effusion that prompted treatment with Lasix.  She was unable to take the prescribed course of Lasix, however, due to diarrhea.  She felt her shortness of breath was worsening and also developed some right-sided chest tightness.  After consulting with her cardiologist office, she was advised to go to the emergency room today.  Work-up today has included a repeat echo that shows the pericardial effusion has enlarged further since the previous study on March 25, 2021.  It now measures about 3 cm and is circumferential.  There was no right ventricular or right atrial collapse during diastole.  Ejection fraction was estimated at 60 to 65%.  There was mild aortic stenosis with an estimated aortic valve area of 1.71 cm.  Mitral valve prolapse was not mentioned although she has a longstanding history of this.  CT scan of the chest confirmed the large pericardial effusion and also showed a large right pleural effusion and small left pleural effusion.  A right upper lobe nodule seen on previous studies was felt to be stable.  She is also shown to  have aortic atherosclerosis on chest CT scan.   CT surgery was asked to evaluate [name] for surgical management of her progressively enlarging pericardial effusion and large right pleural effusion.   [name] was interviewed this evening with her husband present at the bedside.  She is sitting up on the side of the bed and reports feeling much better since being given a dose of IV Lasix and and also having the supplemental nasal cannula oxygen.  She denies having any chest pain associated with her episodes of shortness of breath.  Her appetite has been good recently and her weight has been stable.  She has never smoked.

## 2021-04-19 NOTE — Anesthesia Postprocedure Evaluation (Signed)
Anesthesia Post Note  Patient: Autumn Conley  Procedure(s) Performed: attempted XI ROBOTIC ASSISTED THORASCOPY (Right: Chest) INTERCOSTAL NERVE BLOCK (Right: Chest) PERICARDIAL WINDOW WITH THORACOTOMY APPROACH (Chest)     Patient location during evaluation: PACU Anesthesia Type: General Level of consciousness: awake Pain management: pain level controlled Vital Signs Assessment: post-procedure vital signs reviewed and stable Respiratory status: spontaneous breathing Cardiovascular status: stable Postop Assessment: no apparent nausea or vomiting Anesthetic complications: no   No notable events documented.  Last Vitals:  Vitals:   04/19/21 1708 04/19/21 1723  BP: 103/60 108/61  Pulse: 62 63  Resp: 18 12  Temp:    SpO2: 95% 93%    Last Pain:  Vitals:   04/19/21 1708  TempSrc:   PainSc: 10-Worst pain ever                 Nashali Ditmer

## 2021-04-19 NOTE — Progress Notes (Signed)
Progress Note  Patient Name: Autumn Conley Date of Encounter: 04/19/2021  [facility] Cardiologist: [name], MD   Subjective   Reports dyspnea improved  Inpatient Medications    Scheduled Meds:  vitamin C  1,000 mg Oral Daily   atorvastatin  40 mg Oral QHS   carvedilol  3.125 mg Oral BID WC   cholecalciferol  5,000 Units Oral Daily   cyanocobalamin  1,000 mcg Intramuscular Q30 days   enoxaparin (LOVENOX) injection  40 mg Subcutaneous Q24H   irbesartan  150 mg Oral Daily   pantoprazole  40 mg Oral QAC breakfast   PARoxetine  60 mg Oral Daily   sodium chloride flush  3 mL Intravenous Q12H   vitamin E  400 Units Oral Daily   Continuous Infusions:  sodium chloride     PRN Meds: sodium chloride, acetaminophen, ALPRAZolam, butalbital-acetaminophen-caffeine, docusate sodium, hydrALAZINE, ondansetron (ZOFRAN) IV, ondansetron, polyethylene glycol, sodium chloride flush, SUMAtriptan   Vital Signs    Vitals:   04/18/21 2356 04/19/21 0318 04/19/21 0322 04/19/21 0800  BP: (!) 141/79 (!) 152/95  (!) 149/84  Pulse: 67 74  74  Resp: 16 16  17   Temp: 98.3 F (36.8 C) 97.8 F (36.6 C)  98 F (36.7 C)  TempSrc: Oral Oral  Oral  SpO2: 96% 96%  98%  Weight:   70.9 kg   Height:        Intake/Output Summary (Last 24 hours) at 04/19/2021 0836 Last data filed at 04/18/2021 2249 Gross per 24 hour  Intake --  Output 1000 ml  Net -1000 ml   Last 3 Weights 04/19/2021 04/18/2021 03/27/2021  Weight (lbs) 156 lb 3.2 oz 159 lb 6.3 oz 159 lb 6.4 oz  Weight (kg) 70.852 kg 72.3 kg 72.303 kg      Telemetry    Normal sinus rhythm rate 60s to 70s- Personally Reviewed  ECG    No new ECG- Personally Reviewed  Physical Exam   GEN: No acute distress.   Neck: + JVD Cardiac: RRR, 3/6 systolic murmur loudest at RUSB Respiratory: Diminished at right base GI: Soft, nontender, non-distended  MS: No edema; No deformity. Neuro:  Nonfocal  Psych: Normal affect   Labs    High  Sensitivity Troponin:   Recent Labs  Lab 04/18/21 1257  TROPONINIHS 9      Chemistry Recent Labs  Lab 04/18/21 1257 04/19/21 0337  NA 137 139  K 4.1 3.5  CL 106 102  CO2 25 29  GLUCOSE 107* 104*  BUN 13 12  CREATININE 0.76 0.83  CALCIUM 9.1 9.3  GFRNONAA >60 >60  ANIONGAP 6 8     Hematology Recent Labs  Lab 04/18/21 1257  WBC 5.6  RBC 3.40*  HGB 10.7*  HCT 32.6*  MCV 95.9  MCH 31.5  MCHC 32.8  RDW 14.9  PLT 235    BNP Recent Labs  Lab 04/18/21 1257  BNP 163.9*     DDimer No results for input(s): DDIMER in the last 168 hours.   Radiology    DG Chest 2 View  Result Date: 04/18/2021 CLINICAL DATA:  Shortness of breath over the last several days. EXAM: CHEST - 2 VIEW COMPARISON:  05/01/2020 FINDINGS: The cardiac silhouette is enlarged which could be due to cardiomegaly and/or pericardial fluid. Chronic aortic atherosclerotic calcification. Bilateral pleural effusions, larger on the right than the left, with dependent atelectasis in the lower lungs. Lung apices are clear. IMPRESSION: Enlarged cardiac silhouette. Bilateral effusions, right larger  than left, with volume loss in the lower lobes, right worse than left. Findings most consistent with congestive heart failure. Coexistent pneumonia not excluded. Electronically Signed   By: Nelson Chimes M.D.   On: 04/18/2021 12:36   CT CHEST WO CONTRAST  Result Date: 04/18/2021 CLINICAL DATA:  Evaluate effusions, shortness of breath EXAM: CT CHEST WITHOUT CONTRAST TECHNIQUE: Multidetector CT imaging of the chest was performed following the standard protocol without IV contrast. COMPARISON:  05/02/2020, chest x-ray from earlier in the same day, PET-CT from 03/06/2021 FINDINGS: Cardiovascular: Limited due to the lack of IV contrast. Atherosclerotic calcifications of the aorta are noted. Pericardial effusion is seen measuring up to 3 cm thickness. No cardiac enlargement is noted. Coronary calcifications are noted. No  enlargement of the pulmonary artery is seen. Mediastinum/Nodes: Thoracic inlet is within normal limits. Small hiatal hernia is noted. The esophagus is otherwise within normal limits. Scattered small likely reactive lymph nodes are noted within the mediastinum. Lungs/Pleura: Small left pleural effusion is noted. No focal infiltrate is seen. Large right-sided pleural effusion is noted with lower lobe consolidation and some upper lobe consolidation as well. The effusion has increased when compared with the prior PET-CT. Persistent right upper lobe lesion is noted laterally. Upper Abdomen: Visualized upper abdomen shows no acute abnormality. Musculoskeletal: Degenerative changes of the thoracic spine are noted. Fractures of the right fifth and sixth ribs are noted laterally stable from prior exams. IMPRESSION: Enlarging right-sided pleural effusion. The degree of consolidation seen on the right has progressed slightly particularly in the lower lobe related to the enlarging effusion. Large pericardial effusion. Stable right upper lobe nodule similar to that seen on prior PET-CT. Small hiatal hernia. Aortic Atherosclerosis (ICD10-I70.0). Electronically Signed   By: Inez Catalina M.D.   On: 04/18/2021 16:38   ECHOCARDIOGRAM COMPLETE  Result Date: 04/18/2021    ECHOCARDIOGRAM REPORT   Patient Name:   Autumn Conley Date of Exam: 04/18/2021 Medical Rec #:  [id number]   Height:       63.0 in Accession #:    [id number]  Weight:       159.4 lb Date of Birth:  March 20, 1946   BSA:          1.756 m Patient Age:    75 years    BP:           164/90 mmHg Patient Gender: F           HR:           75 bpm. Exam Location:  Inpatient Procedure: 2D Echo, Cardiac Doppler and Color Doppler REPORT CONTAINS CRITICAL RESULT Indications:     I31.3 Pericardial effusion (noninflammatory)  History:         Patient has prior history of Echocardiogram examinations, most                  recent 03/20/2021. Signs/Symptoms:Shortness of Breath and                   Dyspnea; Risk Factors:Hypertension and Dyslipidemia. Breast                  cancer.  Sonographer:     Roseanna Rainbow RDCS Referring Phys:  [id number] Margie Billet Diagnosing Phys: Eleonore Chiquito MD IMPRESSIONS  1. There is a large circumferential pericardial effusion that measures up to 2.7 cm posterior to the LV. There is no RA/RV collapse in diastole. There is ~30% variation in MV inflow. The IVC is dilated with <50%  collapse, but there is diastolic foward flow in the hepatic veins. When compared with the prior study, the effusion is larger and the IVC is now dilated. These changes suggest the effusion is becoming significant. Would recommend clinical correlation for tamponade. Large pericardial effusion. The pericardial effusion is circumferential.  2. Left ventricular ejection fraction, by estimation, is 60 to 65%. The left ventricle has normal function. The left ventricle has no regional wall motion abnormalities. There is moderate concentric left ventricular hypertrophy. Left ventricular diastolic parameters are consistent with Grade I diastolic dysfunction (impaired relaxation).  3. Right ventricular systolic function is normal. The right ventricular size is normal. Tricuspid regurgitation signal is inadequate for assessing PA pressure.  4. The mitral valve is degenerative. No evidence of mitral valve regurgitation. No evidence of mitral stenosis. Moderate to severe mitral annular calcification.  5. The aortic valve is tricuspid. There is moderate calcification of the aortic valve. There is moderate thickening of the aortic valve. Aortic valve regurgitation is mild. Mild aortic valve stenosis. Aortic valve area, by VTI measures 1.71 cm. Aortic valve mean gradient measures 10.0 mmHg. Aortic valve Vmax measures 2.06 m/s.  6. The inferior vena cava is dilated in size with >50% respiratory variability, suggesting right atrial pressure of 8 mmHg. Comparison(s): Changes from prior study are noted. Effusion is larger on  this study. IVC now dilated. FINDINGS  Left Ventricle: Left ventricular ejection fraction, by estimation, is 60 to 65%. The left ventricle has normal function. The left ventricle has no regional wall motion abnormalities. The left ventricular internal cavity size was normal in size. There is  moderate concentric left ventricular hypertrophy. Left ventricular diastolic parameters are consistent with Grade I diastolic dysfunction (impaired relaxation). Right Ventricle: The right ventricular size is normal. No increase in right ventricular wall thickness. Right ventricular systolic function is normal. Tricuspid regurgitation signal is inadequate for assessing PA pressure. Left Atrium: Left atrial size was normal in size. Right Atrium: Right atrial size was normal in size. Pericardium: There is a large circumferential pericardial effusion that measures up to 2.7 cm posterior to the LV. There is no RA/RV collapse in diastole. There is ~30% variation in MV inflow. The IVC is dilated with <50% collapse, but there is diastolic  foward flow in the hepatic veins. When compared with the prior study, the effusion is larger and the IVC is now dilated. These changes suggest the effusion is becoming significant. Would recommend clinical correlation for tamponade. A large pericardial effusion is present. The pericardial effusion is circumferential. Presence of pericardial fat pad. Mitral Valve: The mitral valve is degenerative in appearance. Moderate to severe mitral annular calcification. No evidence of mitral valve regurgitation. No evidence of mitral valve stenosis. Tricuspid Valve: The tricuspid valve is grossly normal. Tricuspid valve regurgitation is trivial. No evidence of tricuspid stenosis. Aortic Valve: The aortic valve is tricuspid. There is moderate calcification of the aortic valve. There is moderate thickening of the aortic valve. Aortic valve regurgitation is mild. Aortic regurgitation PHT measures 472 msec. Mild  aortic stenosis is present. Aortic valve mean gradient measures 10.0 mmHg. Aortic valve peak gradient measures 17.0 mmHg. Aortic valve area, by VTI measures 1.71 cm. Pulmonic Valve: The pulmonic valve was grossly normal. Pulmonic valve regurgitation is trivial. No evidence of pulmonic stenosis. Aorta: The aortic root and ascending aorta are structurally normal, with no evidence of dilitation. Venous: The inferior vena cava is dilated in size with greater than 50% respiratory variability, suggesting right atrial pressure of 8 mmHg.  IAS/Shunts: The atrial septum is grossly normal.  LEFT VENTRICLE PLAX 2D LVIDd:         2.90 cm     Diastology LVIDs:         2.10 cm     LV e' medial:    10.40 cm/s LV PW:         1.60 cm     LV E/e' medial:  10.9 LV IVS:        1.30 cm     LV e' lateral:   9.57 cm/s LVOT diam:     1.80 cm     LV E/e' lateral: 11.8 LV SV:         72 LV SV Index:   41 LVOT Area:     2.54 cm  LV Volumes (MOD) LV vol d, MOD A2C: 49.5 ml LV vol d, MOD A4C: 37.5 ml LV vol s, MOD A2C: 18.3 ml LV vol s, MOD A4C: 11.8 ml LV SV MOD A2C:     31.2 ml LV SV MOD A4C:     37.5 ml LV SV MOD BP:      28.1 ml RIGHT VENTRICLE             IVC RV S prime:     14.30 cm/s  IVC diam: 2.30 cm TAPSE (M-mode): 1.3 cm LEFT ATRIUM             Index       RIGHT ATRIUM           Index LA diam:        2.80 cm 1.59 cm/m  RA Area:     13.20 cm LA Vol (A2C):   31.7 ml 18.05 ml/m RA Volume:   33.40 ml  19.02 ml/m LA Vol (A4C):   28.2 ml 16.06 ml/m LA Biplane Vol: 29.7 ml 16.91 ml/m  AORTIC VALVE AV Area (Vmax):    1.66 cm AV Area (Vmean):   1.64 cm AV Area (VTI):     1.71 cm AV Vmax:           206.00 cm/s AV Vmean:          143.000 cm/s AV VTI:            0.420 m AV Peak Grad:      17.0 mmHg AV Mean Grad:      10.0 mmHg LVOT Vmax:         134.00 cm/s LVOT Vmean:        92.000 cm/s LVOT VTI:          0.283 m LVOT/AV VTI ratio: 0.67 AI PHT:            472 msec  AORTA Ao Root diam: 2.90 cm Ao Asc diam:  3.90 cm MITRAL VALVE MV Area  (PHT): 3.53 cm     SHUNTS MV Decel Time: 215 msec     Systemic VTI:  0.28 m MV E velocity: 113.00 cm/s  Systemic Diam: 1.80 cm MV A velocity: 155.00 cm/s MV E/A ratio:  0.73 Eleonore Chiquito MD Electronically signed by Eleonore Chiquito MD Signature Date/Time: 04/18/2021/4:30:52 PM    Final (Updated)     Cardiac Studies   Echo 04/18/20:  1. There is a large circumferential pericardial effusion that measures up  to 2.7 cm posterior to the LV. There is no RA/RV collapse in diastole.  There is ~30% variation in MV inflow. The IVC is dilated with <50%  collapse, but there is diastolic  foward  flow in the hepatic veins. When compared with the prior study, the  effusion is larger and the IVC is now dilated. These changes suggest the  effusion is becoming significant. Would recommend clinical correlation for  tamponade. Large pericardial effusion.  The pericardial effusion is circumferential.   2. Left ventricular ejection fraction, by estimation, is 60 to 65%. The  left ventricle has normal function. The left ventricle has no regional  wall motion abnormalities. There is moderate concentric left ventricular  hypertrophy. Left ventricular  diastolic parameters are consistent with Grade I diastolic dysfunction  (impaired relaxation).   3. Right ventricular systolic function is normal. The right ventricular  size is normal. Tricuspid regurgitation signal is inadequate for assessing  PA pressure.   4. The mitral valve is degenerative. No evidence of mitral valve  regurgitation. No evidence of mitral stenosis. Moderate to severe mitral  annular calcification.   5. The aortic valve is tricuspid. There is moderate calcification of the  aortic valve. There is moderate thickening of the aortic valve. Aortic  valve regurgitation is mild. Mild aortic valve stenosis. Aortic valve  area, by VTI measures 1.71 cm. Aortic  valve mean gradient measures 10.0 mmHg. Aortic valve Vmax measures 2.06  m/s.   6. The  inferior vena cava is dilated in size with >50% respiratory  variability, suggesting right atrial pressure of 8 mmHg.   Patient Profile     75 y.o. female with a hx of metastatic thymoma s/p chemoradiation, history of breast cancer status post left mastectomy , HTN, HLD, anxiety, IBS, NASH, fibromyalgia, iron deficiency anemia, OSA on CPAP, who is being seen 04/18/2021 for the evaluation of pericardial effusion at the request of Dr. Karel Jarvis.   Assessment & Plan    Large pericardial effusion: Pericardial effusion was initially found on echocardiogram 12/21/2020, progressively increased in size, large on echo 03/20/2021, now with echo this admission showing large effusion with no RV collapse but significant mitral inflow respiratory variation and IVC fixed/dilated.  No clinical evidence of tamponade at this time, remains normal heart rate and hypertensive -CT surgery consulted for pericardial window, planning today with Dr. [name] -Plan to send fluid for cytology   Right pleural effusion - R> L on CXR, CT chest showed enlarging right-sided pleural effusion - s/p IV Lasix at ED, hold off further diuresis.  CT surgery consult as above, planning drainage in OR today -Hold diuresis for now given pericardial effusion with tamponade physiology on echo, though no clinical evidence tamponade at this time   Metastatic thymoma, in remission  -Follows Dr. Mindi Junker at [facility] oncology, s/p chemoradiation, last PET on 5/18, recommended CT surveillance  in 41-monthand office visit 03/26/2021   Hypertension -Blood pressure elevated, home regimen resumed including irbesartan 150 mg daily, will monitor BP trend   Hyperlipidemia -Continue statin   OSA -Resume CPAP at night   Iron deficiency anemia -Hemoglobin 10.7 POA, near baseline, hx of iron infusion in the past, no bleeding    Anxiety with depression Fibromyalgia NASH IBS GERD -Managed per IM  For questions or updates, please contact [facility] Please consult [email or web address] for contact info under        Signed, CDonato Heinz MD  04/19/2021, 8:36 AM

## 2021-04-19 NOTE — Anesthesia Procedure Notes (Signed)
Procedure Name: Intubation Date/Time: 04/19/2021 2:13 PM Performed by: Leonor Liv, CRNA Pre-anesthesia Checklist: Patient identified, Emergency Drugs available, Suction available and Patient being monitored Patient Re-evaluated:Patient Re-evaluated prior to induction Oxygen Delivery Method: Circle System Utilized Preoxygenation: Pre-oxygenation with 100% oxygen Induction Type: IV induction Ventilation: Mask ventilation without difficulty Laryngoscope Size: Miller and 2 Grade View: Grade I Tube type: Oral Endobronchial tube: Double lumen EBT and Left and 35 Fr Number of attempts: 1 Airway Equipment and Method: Stylet and Oral airway Placement Confirmation: ETT inserted through vocal cords under direct vision, positive ETCO2 and breath sounds checked- equal and bilateral Tube secured with: Tape Dental Injury: Teeth and Oropharynx as per pre-operative assessment

## 2021-04-19 NOTE — Anesthesia Postprocedure Evaluation (Signed)
Anesthesia Post Note  Patient: Autumn Conley  Procedure(s) Performed: attempted XI ROBOTIC ASSISTED THORASCOPY (Right: Chest) INTERCOSTAL NERVE BLOCK (Right: Chest) PERICARDIAL WINDOW WITH THORACOTOMY APPROACH (Chest)     Patient location during evaluation: PACU Anesthesia Type: General Level of consciousness: awake Pain management: pain level controlled Respiratory status: spontaneous breathing Cardiovascular status: stable Postop Assessment: no apparent nausea or vomiting Anesthetic complications: no   No notable events documented.  Last Vitals:  Vitals:   04/19/21 1653 04/19/21 1708  BP: (!) 104/56 103/60  Pulse: 60 62  Resp: 14 18  Temp: (!) 36.3 C   SpO2: 93% 95%    Last Pain:  Vitals:   04/19/21 1708  TempSrc:   PainSc: 10-Worst pain ever                 Nusayba Cadenas

## 2021-04-20 ENCOUNTER — Inpatient Hospital Stay (HOSPITAL_COMMUNITY): Payer: Medicare Other

## 2021-04-20 ENCOUNTER — Encounter (HOSPITAL_COMMUNITY): Payer: Self-pay | Admitting: Thoracic Surgery (Cardiothoracic Vascular Surgery)

## 2021-04-20 DIAGNOSIS — I313 Pericardial effusion (noninflammatory): Principal | ICD-10-CM

## 2021-04-20 DIAGNOSIS — J90 Pleural effusion, not elsewhere classified: Secondary | ICD-10-CM

## 2021-04-20 LAB — BASIC METABOLIC PANEL
Anion gap: 10 (ref 5–15)
BUN: 14 mg/dL (ref 8–23)
CO2: 26 mmol/L (ref 22–32)
Calcium: 8.6 mg/dL — ABNORMAL LOW (ref 8.9–10.3)
Chloride: 97 mmol/L — ABNORMAL LOW (ref 98–111)
Creatinine, Ser: 0.85 mg/dL (ref 0.44–1.00)
GFR, Estimated: >60 mL/min (ref >60)
Glucose, Bld: 173 mg/dL — ABNORMAL HIGH (ref 70–99)
Potassium: 3.8 mmol/L (ref 3.5–5.1)
Sodium: 133 mmol/L — ABNORMAL LOW (ref 135–145)

## 2021-04-20 LAB — CBC
HCT: 31.4 % — ABNORMAL LOW (ref 36.0–46.0)
Hemoglobin: 10.4 g/dL — ABNORMAL LOW (ref 12.0–15.0)
MCH: 31.4 pg (ref 26.0–34.0)
MCHC: 33.1 g/dL (ref 30.0–36.0)
MCV: 94.9 fL (ref 80.0–100.0)
Platelets: 238 10*3/uL (ref 150–400)
RBC: 3.31 MIL/uL — ABNORMAL LOW (ref 3.87–5.11)
RDW: 14.5 % (ref 11.5–15.5)
WBC: 9.8 10*3/uL (ref 4.0–10.5)
nRBC: 0 % (ref 0.0–0.2)

## 2021-04-20 MED ORDER — SENNOSIDES-DOCUSATE SODIUM 8.6-50 MG PO TABS: 1.0000 | ORAL_TABLET | Freq: Every evening | ORAL | Status: DC | PRN

## 2021-04-20 MED ORDER — HYDROMORPHONE HCL 1 MG/ML IJ SOLN
1.0000 mg | INTRAMUSCULAR | Status: DC | PRN
  Administered 2021-04-20 – 2021-04-22 (×8): 1 mg via INTRAVENOUS
  Filled 2021-04-20 (×8): qty 1

## 2021-04-20 MED ORDER — SODIUM CHLORIDE 0.9 % IV BOLUS
250.0000 mL | Freq: Once | INTRAVENOUS | Status: AC
  Administered 2021-04-20: 250 mL via INTRAVENOUS

## 2021-04-20 MED ORDER — GUAIFENESIN-DM 100-10 MG/5ML PO SYRP
5.0000 mL | ORAL_SOLUTION | Freq: Three times a day (TID) | ORAL | Status: DC
  Administered 2021-04-20 – 2021-04-25 (×15): 5 mL via ORAL
  Filled 2021-04-20 (×15): qty 5

## 2021-04-20 MED ORDER — DOCUSATE SODIUM 100 MG PO CAPS
100.0000 mg | ORAL_CAPSULE | Freq: Two times a day (BID) | ORAL | Status: DC
  Administered 2021-04-20 – 2021-04-23 (×7): 100 mg via ORAL
  Filled 2021-04-20 (×7): qty 1

## 2021-04-20 MED ORDER — VALACYCLOVIR HCL 500 MG PO TABS
1000.0000 mg | ORAL_TABLET | Freq: Two times a day (BID) | ORAL | Status: DC
  Administered 2021-04-20 – 2021-04-25 (×11): 1000 mg via ORAL
  Filled 2021-04-20 (×12): qty 2

## 2021-04-20 MED ORDER — POLYETHYLENE GLYCOL 3350 17 G PO PACK
17.0000 g | PACK | Freq: Every day | ORAL | Status: DC
  Administered 2021-04-22 – 2021-04-23 (×2): 17 g via ORAL
  Filled 2021-04-20 (×3): qty 1

## 2021-04-20 MED ORDER — MORPHINE SULFATE (PF) 2 MG/ML IV SOLN
2.0000 mg | Freq: Once | INTRAVENOUS | Status: AC
Start: 2021-04-20 — End: 2021-04-20
  Administered 2021-04-20: 2 mg via INTRAVENOUS
  Filled 2021-04-20: qty 1

## 2021-04-20 NOTE — Plan of Care (Signed)

## 2021-04-20 NOTE — Progress Notes (Addendum)
      Autumn Conley       [name]             [phone]      1 Day Post-Op Procedure(s) (LRB): attempted XI ROBOTIC ASSISTED THORASCOPY (Right) INTERCOSTAL NERVE BLOCK (Right) PERICARDIAL WINDOW WITH THORACOTOMY APPROACH (N/A)  Subjective:  Patient having a lot of pain.  Improved after IV morphine this morning.  She states its very painful to cough or take deep breath.  I told patient importance of deep breathing and to do the best she can.  Objective: Vital signs in last 24 hours: Temp:  [97.4 F (36.3 C)-98.4 F (36.9 C)] 97.7 F (36.5 C) (07/02 0729) Pulse Rate:  [60-100] 71 (07/02 0729) Cardiac Rhythm: Normal sinus rhythm (07/02 0700) Resp:  [10-19] 16 (07/02 0729) BP: (93-135)/(55-85) 102/67 (07/02 0729) SpO2:  [93 %-98 %] 97 % (07/02 0729) Weight:  [70.9 kg] 70.9 kg (07/02 0618)  Intake/Output from previous day: 07/01 0701 - 07/02 0700 In: 100 [IV Piggyback:100] Out: 1101 [Urine:200; Blood:100; Chest Tube:801]   General appearance: alert, cooperative, and no distress Heart: regular rate and rhythm Lungs: clear to auscultation bilaterally Abdomen: soft, non-tender; bowel sounds normal; no masses,  no organomegaly Wound: clean and dry  Lab Results: Recent Labs    04/18/21 1257 04/20/21 0040  WBC 5.6 9.8  HGB 10.7* 10.4*  HCT 32.6* 31.4*  PLT 235 238   BMET:  Recent Labs    04/19/21 0337 04/20/21 0040  NA 139 133*  K 3.5 3.8  CL 102 97*  CO2 29 26  GLUCOSE 104* 173*  BUN 12 14  CREATININE 0.83 0.85  CALCIUM 9.3 8.6*    PT/INR: No results for input(s): LABPROT, INR in the last 72 hours. ABG    Component Value Date/Time   PHART 7.380 02/15/2019 0455   HCO3 25.9 02/15/2019 0455   O2SAT 97.1 02/15/2019 0455   CBG (last 3)  No results for input(s): GLUCAP in the last 72 hours.  Assessment/Plan: S/P Procedure(s) (LRB): attempted XI ROBOTIC ASSISTED THORASCOPY (Right) INTERCOSTAL NERVE BLOCK (Right) PERICARDIAL  WINDOW WITH THORACOTOMY APPROACH (N/A)  Right Pleural Chest Tube- no air leak present, level currently at 350 in chest tube.... Left Pericardial drain- 280 cc output since surgery, no air leak present, leave both tubes in place today... CXR with improvement of effusions, no pneumothorax Pain control- adjusted by Primary, continue Toradol, Tramadol, Tylenol, Dilaudid H/O Cold sores- patient requesting home Valtrex, I have reordered H/O IBS- have ordered home regimen of bowel agents Dispo- patient stable, pain improved with addition of IV narcotics, leave tubes in place today, okay to ambulate patients, care per primary   LOS: 2 days    Ellwood Handler, PA-C  04/20/2021   Agree with above Will keep tubes for now  Lajuana Matte

## 2021-04-20 NOTE — Progress Notes (Signed)
Pt having irregular rhythms on monitor. Rhythms include irregular hr, afib, and sinus tach with rate ranging from 70s-110s. EKG performed and showed atrial tach and sinus tach. MD on call informed. Will continue to monitor.

## 2021-04-20 NOTE — Op Note (Signed)
[address]       [place] [address]             [phone]        04/20/2021  Patient:  Autumn Conley Pre-Op Dx: Right pleural effusion   Pericardial effusion   History of malignant thymoma status post chemo radiation.   History of left breast cancer status postmastectomy Post-op Dx: Same Procedure: - Robotic assisted right video thoracoscopy -Drainage of right pleural effusion -Left anterior minithoracotomy - Pericardial window - Intercostal nerve block  Surgeon and Role:      * Dhanvin Szeto, Lucile Crater, MD - Primary    *E. Barrett, PA-C- assisting  Anesthesia  general EBL: 15 ml Blood Administration: None Specimen: Pleural effusion, pericardial effusion, pericardium  Drains: 28 F chest tube in right chest, 19 French Blake in the pericardium Counts: correct   Indications: This is a 75 year old female that is admitted to the hospital with worsening shortness of breath.  Cross-sectional imaging and echocardiogram showed a large circumferential pericardial effusion as well as a right-sided pleural effusion.  She has a history of malignant thymoma that was treated with radiation and chemotherapy, this was biopsied via parasternal incision as well as a right thoracoscopy.  Given the size of her pericardial effusion the decision was made to undergo pericardial window.  Findings: We began with a right robotic assisted thoracoscopy but we had very poor lung isolation.  There were multiple adhesions to the pericardium and was difficult to identify an adequate plane.  I attempted to mobilize the lung from a posterior approach but with poor lung isolation this was very difficult.  Elected to abort the thoracoscopy after drainage of the bloody pleural effusion.  The patient was then placed supine and performed a mini anterior thoracotomy and opened up the pericardium.  The pericardial fluid was bloody.  Operative Technique: After the risks, benefits and alternatives  were thoroughly discussed, the patient was brought to the operative theatre.  Anesthesia was induced, and the patient was then placed in a left lazy lateral decubitus position and was prepped and draped in normal sterile fashion.  An appropriate surgical pause was performed, and pre-operative antibiotics were dosed accordingly.  I placed the first robotic port it was apparent that I was in the lung.  I was very concerned about adhesions thus I performed a 3 cm access incision in the fifth intercostal space anteriorly.  There was no significant adhesions however there was very poor isolation of the lung.  We held ventilation while we placed 2 other robotic ports. The robot was then docked and all instruments were passed under direct visualization.  It was very difficult to visualize the pericardium given although the radiation scar and adhesions anteriorly.  I attempted to mobilize the lung off of the anterior pericardium but this was very challenging.  I then attempted to move closer to the diaphragm posteriorly but this was very challenging given the poor lung isolation.  We are unable to troubleshoot the position of the double-lumen endotracheal tube thus I elected to simply drain the pleural effusion with a 28 French Arg[place] chest tube in reposition the patient.  All instruments were removed and the robot was undocked.  All incisions were then closed.   The patient was then placed supine and the chest was prepped and draped in normal sterile fashion.  A 4 cm incision was made along the fourth intercostal space and this was carried down with Bovie  cautery until we entered the pleural space.  The anterior pericardium was evident.  The tissue retractor was then placed and we created a pericardial window here.  There was a bloody effusion that was drained.  A 19 [place] Blake was then placed inferior to the incision into the pericardial space.  My incision was then closed with several layers absorbable  suture.  The fluid and pericardium were sent for specimen.  An intercostal nerve block was performed.   The patient tolerated the procedure without any immediate complications, and was transferred to the PACU in stable condition.  [name]

## 2021-04-20 NOTE — Plan of Care (Signed)
  Problem: Clinical Measurements: Goal: Respiratory complications will improve Outcome: Not Progressing   Problem: Coping: Goal: Level of anxiety will decrease Outcome: Not Progressing   Problem: Elimination: Goal: Will not experience complications related to urinary retention Outcome: Not Progressing

## 2021-04-20 NOTE — Progress Notes (Signed)
Late entry: I reviewed patient's medical records, but not able to assess patient on 07/01 because patient having surgical procedure.

## 2021-04-20 NOTE — Progress Notes (Signed)
Patient with hypotension, blood pressure 97/44, with HR 57 to 60, she is awake and alert.  Irregular heart rate. Decreased urine output.   Plan to bolus NS 250 ml x2 and check EKG Hold on carvedilol for now.   Target MAP 60 or greater.

## 2021-04-20 NOTE — Progress Notes (Signed)
   S/p pericardial window and chest tube drainage yesterday - diuresis being held by hospitalist. Main issue is post-op pain. Would recommend a repeat limited echo on Monday to ensure pericardial effusion has resolved after window. Cardiology will follow peripherally over the weekend.  Pixie Casino, MD, [facility], [place] Director of the [facility]id [facility]  Card[facility]ovascular [facility] Diplomate of the American Board of Clinical Lipidology Attending Cardiologist  Direct Dial: [phone]  Fax: [phone]  Website:  [email or web address]

## 2021-04-20 NOTE — Progress Notes (Addendum)
PROGRESS NOTE    Autumn Conley  FXO:[id number] DOB: 28-Oct-1945 DOA: 04/18/2021 PCP: Autumn Coke, PA    Brief Narrative:  Autumn Conley was admitted to the hospital with the working diagnosis of large right pleural effusion and enlarging pericardial effusion without tamponade, in the setting of malignant thymoma.  SP pericardial window and drainage of right pleural effusion.   75 year old female past medical history for post viral pericardial effusion, malignant thymoma status postradiation and chemotherapy, pulmonary fibrosis, hypertension, and dyslipidemia who presented with dyspnea.  She was diagnosed with pericardial effusion in April 2022.  Follow-up echocardiography in June 2022 showed worsening pericardial effusion.  She was placed on furosemide with no much improvement in her symptoms.  Because of persistent and worsening dyspnea she presented to the hospital.  On her initial physical examination blood pressure 157/75, heart rate 63, respiratory rate 21, oxygen saturation 99% she had decreased breath sounds bilaterally, increased work of breathing, heart S1-S2, positive rubs, no gallops, abdomen soft, no lower extremity edema.  Sodium 137, potassium 4.1, chloride 106, bicarb 25, glucose 107, BUN 13, creatinine 0.75, BNP 163, high sensitive troponin 9, white count 5.6, hemoglobin 10.7, hematocrit 32.6, platelets 235. SARS COVID-19 negative.  Chest radiograph with cardiomegaly, bilateral pleural effusions more right than left, right lower lobe, middle lobe atelectasis.  CT chest enlarging right-sided pleural effusion, right lower lobe middle lobe atelectasis.  Large pericardial effusion.  EKG 79 bpm, normal axis, normal intervals, sinus rhythm with PACs, no significant ST segment or T wave changes.  Low voltage.  Echocardiogram with worsening large pericardial effusion with no RA/RC collapse in diastole. LV EF 60 to 65%.   Patient underwent pericardial window and right pleural chest tube  placement.   (I was not able to evaluate patient yesterday 07/01, because she was in the OR for surgical procedure)  Assessment & Plan:   Active Problems:   Acute on chronic diastolic CHF (congestive heart failure) (HCC)   CHF (congestive heart failure) (HCC)   Pericardial effusion   Pleural effusion   Large right pleural effusion and pericardial effusion. Now patient is sp right chest tune and pericardial window with drain in place. Today with moderate to severe pleuritic chest pain.  Right chest tube with no air leak. Follow up chest film with no pneumothorax, atelectasis at the base of the right upper lobe.   Add hydromorphone for pain control and antitussive agent with guaifenesin with dextromethorphan. Continue as needed ketorolac.  Continue oxymetry monitoring, and airway clearing techniques with incentive spirometer.  Out of bed to chair tid with meals, PT, OT evaluation. Follow with surgery recommendations.   2. Acute on chronic diastolic heart failure. Today clinically euvolemic, sp pericardiocentesis. Hold on diuresis for now. Continue blood pressure monitoring.   3. Thymoma. Sp radiation and chemotherapy. Follow up as outpatient.   4. HTN/ dyslipidemia. Continue blood pressure control with carvedilol hols on irbesartan to prevent hypotension. Continue with statin therapy.   5. Depression and anxiety. Continue with paroxetine and alprazolam.   Patient continue to be at high risk for worsening effusions  Status is: Inpatient  Remains inpatient appropriate because:IV treatments appropriate due to intensity of illness or inability to take PO  Dispo: The patient is from: Home              Anticipated d/c is to: Home              Patient currently is not medically stable to d/c.   Difficult to place  patient No   DVT prophylaxis: Enoxaparin   Code Status:   full  Family Communication:  I spoke with patient's husband at the bedside, we talked in detail about patient's  condition, plan of care and prognosis and all questions were addressed.    Consultants:  Cardiology CT surgery   Procedures:  Right chest tube Pericardial window and drain      Subjective: Patient having moderate to severe pleuritic chest pain,  and cough (dry), no nausea or vomiting.   Objective: Vitals:   04/19/21 1900 04/19/21 2300 04/20/21 0302 04/20/21 0618  BP: 99/80 (!) 93/55 116/85   Pulse: 66 66 100   Resp: 16 16 19    Temp: 97.7 F (36.5 C) 98 F (36.7 C) 98.1 F (36.7 C)   TempSrc: Oral Oral Oral   SpO2: 93% 93% 96%   Weight:    70.9 kg  Height:        Intake/Output Summary (Last 24 hours) at 04/20/2021 0826 Last data filed at 04/20/2021 0618 Gross per 24 hour  Intake 100 ml  Output 1101 ml  Net -1001 ml   Filed Weights   04/18/21 1321 04/19/21 0322 04/20/21 0618  Weight: 72.3 kg 70.9 kg 70.9 kg    Examination:   General: Not in pain or dyspnea, deconditioned  Neurology: Awake and alert, non focal  E ENT: mild pallor, no icterus, oral mucosa moist Cardiovascular: No JVD. S1-S2 present, rhythmic, no gallops, rubs, or murmurs. No lower extremity edema. Pulmonary:  positive breath sounds bilaterally, with no wheezing, or rhonchi mild rales at the right side. Gastrointestinal. Abdomen soft and non tender Skin. No rashes Musculoskeletal: no joint deformities Bilateral chest tubes, right pleural and left pericardium.     Data Reviewed: I have personally reviewed following labs and imaging studies  CBC: Recent Labs  Lab 04/18/21 1257 04/20/21 0040  WBC 5.6 9.8  NEUTROABS 4.5  --   HGB 10.7* 10.4*  HCT 32.6* 31.4*  MCV 95.9 94.9  PLT 235 244   Basic Metabolic Panel: Recent Labs  Lab 04/18/21 1257 04/19/21 0337 04/20/21 0040  NA 137 139 133*  K 4.1 3.5 3.8  CL 106 102 97*  CO2 25 29 26   GLUCOSE 107* 104* 173*  BUN 13 12 14   CREATININE 0.76 0.83 0.85  CALCIUM 9.1 9.3 8.6*   GFR: Estimated Creatinine Clearance: 54 mL/min (by C-G  formula based on SCr of 0.85 mg/dL). Liver Function Tests: No results for input(s): AST, ALT, ALKPHOS, BILITOT, PROT, ALBUMIN in the last 168 hours. No results for input(s): LIPASE, AMYLASE in the last 168 hours. No results for input(s): AMMONIA in the last 168 hours. Coagulation Profile: No results for input(s): INR, PROTIME in the last 168 hours. Cardiac Enzymes: No results for input(s): CKTOTAL, CKMB, CKMBINDEX, TROPONINI in the last 168 hours. BNP (last 3 results) No results for input(s): PROBNP in the last 8760 hours. HbA1C: No results for input(s): HGBA1C in the last 72 hours. CBG: No results for input(s): GLUCAP in the last 168 hours. Lipid Profile: No results for input(s): CHOL, HDL, LDLCALC, TRIG, CHOLHDL, LDLDIRECT in the last 72 hours. Thyroid Function Tests: No results for input(s): TSH, T4TOTAL, FREET4, T3FREE, THYROIDAB in the last 72 hours. Anemia Panel: No results for input(s): VITAMINB12, FOLATE, FERRITIN, TIBC, IRON, RETICCTPCT in the last 72 hours.    Radiology Studies: I have reviewed all of the imaging during this hospital visit personally     Scheduled Meds:  acetaminophen  1,000 mg  Oral Q6H   Or   acetaminophen (TYLENOL) oral liquid 160 mg/5 mL  1,000 mg Oral Q6H   vitamin C  1,000 mg Oral Daily   atorvastatin  40 mg Oral QHS   bisacodyl  10 mg Oral Daily   carvedilol  3.125 mg Oral BID WC   Chlorhexidine Gluconate Cloth  6 each Topical Q0600   cholecalciferol  5,000 Units Oral Daily   cyanocobalamin  1,000 mcg Intramuscular Q30 days   enoxaparin (LOVENOX) injection  40 mg Subcutaneous Q24H   irbesartan  150 mg Oral Daily    morphine injection  2 mg Intravenous Once   mupirocin ointment  1 application Nasal BID   pantoprazole  40 mg Oral QAC breakfast   PARoxetine  60 mg Oral Daily   senna-docusate  1 tablet Oral QHS   sodium chloride flush  3 mL Intravenous Q12H   vitamin E  400 Units Oral Daily   Continuous Infusions:  sodium chloride        LOS: 2 days        Anica Alcaraz Gerome Apley, MD

## 2021-04-21 ENCOUNTER — Inpatient Hospital Stay (HOSPITAL_COMMUNITY): Payer: Medicare Other

## 2021-04-21 DIAGNOSIS — Z09 Encounter for follow-up examination after completed treatment for conditions other than malignant neoplasm: Secondary | ICD-10-CM

## 2021-04-21 LAB — COMPREHENSIVE METABOLIC PANEL
ALT: 24 U/L (ref 0–44)
AST: 19 U/L (ref 15–41)
Albumin: 2.8 g/dL — ABNORMAL LOW (ref 3.5–5.0)
Alkaline Phosphatase: 92 U/L (ref 38–126)
Anion gap: 9 (ref 5–15)
BUN: 25 mg/dL — ABNORMAL HIGH (ref 8–23)
CO2: 24 mmol/L (ref 22–32)
Calcium: 8.3 mg/dL — ABNORMAL LOW (ref 8.9–10.3)
Chloride: 99 mmol/L (ref 98–111)
Creatinine, Ser: 1.27 mg/dL — ABNORMAL HIGH (ref 0.44–1.00)
GFR, Estimated: 44 mL/min — ABNORMAL LOW (ref >60)
Glucose, Bld: 102 mg/dL — ABNORMAL HIGH (ref 70–99)
Potassium: 3.7 mmol/L (ref 3.5–5.1)
Sodium: 132 mmol/L — ABNORMAL LOW (ref 135–145)
Total Bilirubin: 0.7 mg/dL (ref 0.3–1.2)
Total Protein: 5.4 g/dL — ABNORMAL LOW (ref 6.5–8.1)

## 2021-04-21 LAB — CBC
HCT: 28.3 % — ABNORMAL LOW (ref 36.0–46.0)
Hemoglobin: 9.4 g/dL — ABNORMAL LOW (ref 12.0–15.0)
MCH: 31.8 pg (ref 26.0–34.0)
MCHC: 33.2 g/dL (ref 30.0–36.0)
MCV: 95.6 fL (ref 80.0–100.0)
Platelets: 250 10*3/uL (ref 150–400)
RBC: 2.96 MIL/uL — ABNORMAL LOW (ref 3.87–5.11)
RDW: 14.6 % (ref 11.5–15.5)
WBC: 8.6 10*3/uL (ref 4.0–10.5)
nRBC: 0 % (ref 0.0–0.2)

## 2021-04-21 MED ORDER — SODIUM CHLORIDE 0.9 % IV SOLN: INTRAVENOUS | Status: DC

## 2021-04-21 MED ORDER — ALUM & MAG HYDROXIDE-SIMETH 200-200-20 MG/5ML PO SUSP
30.0000 mL | ORAL | Status: DC | PRN
  Administered 2021-04-21: 30 mL via ORAL
  Filled 2021-04-21: qty 30

## 2021-04-21 NOTE — Plan of Care (Signed)

## 2021-04-21 NOTE — Plan of Care (Signed)

## 2021-04-21 NOTE — Progress Notes (Signed)
     [address]       [place] [address]             [phone]       No events Pain improved  Vitals:   04/21/21 0413 04/21/21 0716  BP: (!) 94/56 (!) 102/56  Pulse: 67 64  Resp: 14 16  Temp:  97.8 F (36.6 C)  SpO2: 93% 97%   Serous CT output 400/271ms overnight  Will transition to waterseal Will keep tubes for now  [employer]

## 2021-04-21 NOTE — Progress Notes (Signed)
PROGRESS NOTE    Autumn Conley  AXK:[id number] DOB: 05/02/1946 DOA: 04/18/2021 PCP: Autumn Coke, PA    Brief Narrative:  Autumn Conley was admitted to the hospital with the working diagnosis of large right pleural effusion and enlarging pericardial effusion without tamponade, in the setting of malignant thymoma. SP pericardial window and drainage of right pleural effusion.    75 year old female past medical history for post viral pericardial effusion, malignant thymoma status postradiation and chemotherapy, pulmonary fibrosis, hypertension, and dyslipidemia who presented with dyspnea.  She was diagnosed with pericardial effusion in April 2022.  Follow-up echocardiography in June 2022 showed worsening pericardial effusion.  She was placed on furosemide with no much improvement in her symptoms.  Because of persistent and worsening dyspnea she presented to the hospital.  On her initial physical examination blood pressure 157/75, heart rate 63, respiratory rate 21, oxygen saturation 99% she had decreased breath sounds bilaterally, increased work of breathing, heart S1-S2, positive rubs, no gallops, abdomen soft, no lower extremity edema.   Sodium 137, potassium 4.1, chloride 106, bicarb 25, glucose 107, BUN 13, creatinine 0.75, BNP 163, high sensitive troponin 9, white count 5.6, hemoglobin 10.7, hematocrit 32.6, platelets 235. SARS COVID-19 negative.   Chest radiograph with cardiomegaly, bilateral pleural effusions more right than left, right lower lobe, middle lobe atelectasis.   CT chest enlarging right-sided pleural effusion, right lower lobe middle lobe atelectasis.  Large pericardial effusion.   EKG 79 bpm, normal axis, normal intervals, sinus rhythm with PACs, no significant ST segment or T wave changes.  Low voltage.   Echocardiogram with worsening large pericardial effusion with no RA/RC collapse in diastole. LV EF 60 to 65%.   Patient underwent pericardial window and right pleural chest  tube placement with good toleration.     Patient developed hypotension responsive to IV fluids.   Assessment & Plan:   Active Problems:   Acute on chronic diastolic CHF (congestive heart failure) (HCC)   CHF (congestive heart failure) (HCC)   Pericardial effusion   Pleural effusion   Large right pleural effusion and pericardial effusion.  Right chest tube with no air leak. Chest film from today personally reviewed, no pneumothorax, persistent atelectasis base of the right upper lobe and new atelectasis on left lower lobe. Right chest tube and pericardial drain in place.  Chest tube output documented 630 ml.    Continue pain control with hydromorphone, antitussive agent with guaifenesin with dextromethorphan and ketorolac. Airway clearing techniques with incentive spirometer. Continue to encourage mobility.  Follow up on repeat echocardiogram.   2. AKI due to hypovolemic hypotension/ hyponatremia. Patient had hypotension, that has responded to IV fluids. She has about 2 L negative fluid balance. Worsening renal function with serum cr at 1,27, K is 3,7, Na 132 and bicarbonate 24 with Cl 99.   Plan to start isotonic saline at 75 ml per hr, continue to hold on antihypertensive medications and diuretics. Continue close blood pressure monitoring.    2. Acute on chronic diastolic heart failure.  sp pericardiocentesis. Because hypotension, will continue to hold on carvedilol and diuretics.    3. Thymoma. Chronic anemia Sp radiation and chemotherapy. Follow up as outpatient. Hgb is stable at 9,4 and Hct at 28.3    4. HTN/ dyslipidemia. Holding blood pressure medications.  Continue with statin therapy.    5. Depression and anxiety. On paroxetine and alprazolam.   Patient continue to be at high risk for worsening hypotension and renal failure.   Status is: Inpatient  Remains inpatient appropriate because:IV treatments appropriate due to intensity of illness or inability to take  PO  Dispo: The patient is from: Home              Anticipated d/c is to: Home              Patient currently is not medically stable to d/c.   Difficult to place patient No   DVT prophylaxis: Enoxaparin   Code Status:   Full  Family Communication:  I spoke with patient's husband at the bedside, we talked in detail about patient's condition, plan of care and prognosis and all questions were addressed.     Consultants:  CT surgery  Cardiology   Procedures:  Right chest tube Pericardial drain      Subjective: Patient reports better pain control, no dyspnea or chest pain, continue to be very weak and deconditioned. Positive hypotension over night.   Objective: Vitals:   04/21/21 0300 04/21/21 0412 04/21/21 0413 04/21/21 0716  BP: (!) 78/49  (!) 94/56 (!) 102/56  Pulse: (!) 53 65 67 64  Resp: 16 19 14 16   Temp: 97.7 F (36.5 C)   97.8 F (36.6 C)  TempSrc: Oral   Oral  SpO2:  92% 93% 97%  Weight: 77.8 kg     Height:        Intake/Output Summary (Last 24 hours) at 04/21/2021 0842 Last data filed at 04/21/2021 0634 Gross per 24 hour  Intake --  Output 730 ml  Net -730 ml   Filed Weights   04/19/21 0322 04/20/21 0618 04/21/21 0300  Weight: 70.9 kg 70.9 kg 77.8 kg    Examination:   General: Not in pain or dyspnea. Deconditioned  Neurology: Awake and alert, non focal  E ENT: mild pallor, no icterus, oral mucosa moist Cardiovascular: No JVD. S1-S2 present, rhythmic, no gallops, rubs, or murmurs. No lower extremity edema. Pulmonary: positive breath sounds bilaterally, adequate air movement, no wheezing, or rhonchi. Rales at bases bilaterally  Gastrointestinal. Abdomen soft and non tender Skin. No rashes Musculoskeletal: no joint deformities     Data Reviewed: I have personally reviewed following labs and imaging studies  CBC: Recent Labs  Lab 04/18/21 1257 04/20/21 0040 04/21/21 0221  WBC 5.6 9.8 8.6  NEUTROABS 4.5  --   --   HGB 10.7* 10.4* 9.4*  HCT  32.6* 31.4* 28.3*  MCV 95.9 94.9 95.6  PLT 235 238 081   Basic Metabolic Panel: Recent Labs  Lab 04/18/21 1257 04/19/21 0337 04/20/21 0040 04/21/21 0221  NA 137 139 133* 132*  K 4.1 3.5 3.8 3.7  CL 106 102 97* 99  CO2 25 29 26 24   GLUCOSE 107* 104* 173* 102*  BUN 13 12 14  25*  CREATININE 0.76 0.83 0.85 1.27*  CALCIUM 9.1 9.3 8.6* 8.3*   GFR: Estimated Creatinine Clearance: 37.8 mL/min (A) (by C-G formula based on SCr of 1.27 mg/dL (H)). Liver Function Tests: Recent Labs  Lab 04/21/21 0221  AST 19  ALT 24  ALKPHOS 92  BILITOT 0.7  PROT 5.4*  ALBUMIN 2.8*   No results for input(s): LIPASE, AMYLASE in the last 168 hours. No results for input(s): AMMONIA in the last 168 hours. Coagulation Profile: No results for input(s): INR, PROTIME in the last 168 hours. Cardiac Enzymes: No results for input(s): CKTOTAL, CKMB, CKMBINDEX, TROPONINI in the last 168 hours. BNP (last 3 results) No results for input(s): PROBNP in the last 8760 hours. HbA1C: No results for input(s):  HGBA1C in the last 72 hours. CBG: No results for input(s): GLUCAP in the last 168 hours. Lipid Profile: No results for input(s): CHOL, HDL, LDLCALC, TRIG, CHOLHDL, LDLDIRECT in the last 72 hours. Thyroid Function Tests: No results for input(s): TSH, T4TOTAL, FREET4, T3FREE, THYROIDAB in the last 72 hours. Anemia Panel: No results for input(s): VITAMINB12, FOLATE, FERRITIN, TIBC, IRON, RETICCTPCT in the last 72 hours.    Radiology Studies: I have reviewed all of the imaging during this hospital visit personally     Scheduled Meds:  acetaminophen  1,000 mg Oral Q6H   Or   acetaminophen (TYLENOL) oral liquid 160 mg/5 mL  1,000 mg Oral Q6H   vitamin C  1,000 mg Oral Daily   atorvastatin  40 mg Oral QHS   Chlorhexidine Gluconate Cloth  6 each Topical Q0600   cholecalciferol  5,000 Units Oral Daily   cyanocobalamin  1,000 mcg Intramuscular Q30 days   docusate sodium  100 mg Oral BID   enoxaparin  (LOVENOX) injection  40 mg Subcutaneous Q24H   guaiFENesin-dextromethorphan  5 mL Oral TID   mupirocin ointment  1 application Nasal BID   pantoprazole  40 mg Oral QAC breakfast   PARoxetine  60 mg Oral Daily   polyethylene glycol  17 g Oral Daily   sodium chloride flush  3 mL Intravenous Q12H   valACYclovir  1,000 mg Oral BID   vitamin E  400 Units Oral Daily   Continuous Infusions:  sodium chloride       LOS: 3 days        Kallen Delatorre Gerome Apley, MD

## 2021-04-22 ENCOUNTER — Inpatient Hospital Stay (HOSPITAL_COMMUNITY): Payer: Medicare Other

## 2021-04-22 DIAGNOSIS — I313 Pericardial effusion (noninflammatory): Secondary | ICD-10-CM

## 2021-04-22 DIAGNOSIS — Z09 Encounter for follow-up examination after completed treatment for conditions other than malignant neoplasm: Secondary | ICD-10-CM

## 2021-04-22 LAB — BASIC METABOLIC PANEL
Anion gap: 4 — ABNORMAL LOW (ref 5–15)
BUN: 16 mg/dL (ref 8–23)
CO2: 27 mmol/L (ref 22–32)
Calcium: 8.7 mg/dL — ABNORMAL LOW (ref 8.9–10.3)
Chloride: 104 mmol/L (ref 98–111)
Creatinine, Ser: 0.78 mg/dL (ref 0.44–1.00)
GFR, Estimated: >60 mL/min (ref >60)
Glucose, Bld: 113 mg/dL — ABNORMAL HIGH (ref 70–99)
Potassium: 4.1 mmol/L (ref 3.5–5.1)
Sodium: 135 mmol/L (ref 135–145)

## 2021-04-22 LAB — ECHOCARDIOGRAM LIMITED
Height: 63 in
S' Lateral: 2.5 cm
Weight: 2550.28 oz

## 2021-04-22 MED ORDER — SODIUM CHLORIDE 0.9% FLUSH
10.0000 mL | Freq: Two times a day (BID) | INTRAVENOUS | Status: DC
  Administered 2021-04-22 – 2021-04-25 (×6): 10 mL

## 2021-04-22 MED ORDER — ACETAMINOPHEN 325 MG PO TABS
650.0000 mg | ORAL_TABLET | Freq: Four times a day (QID) | ORAL | Status: DC
  Administered 2021-04-22 – 2021-04-25 (×8): 650 mg via ORAL
  Filled 2021-04-22 (×9): qty 2

## 2021-04-22 MED ORDER — SODIUM CHLORIDE 0.9% FLUSH: 10.0000 mL | INTRAVENOUS | Status: DC | PRN

## 2021-04-22 MED ORDER — OXYCODONE HCL 5 MG PO TABS
5.0000 mg | ORAL_TABLET | ORAL | Status: DC | PRN
  Administered 2021-04-22 – 2021-04-24 (×7): 5 mg via ORAL
  Filled 2021-04-22 (×7): qty 1

## 2021-04-22 NOTE — Plan of Care (Signed)

## 2021-04-22 NOTE — Progress Notes (Signed)
  Echocardiogram 2D Echocardiogram has been performed.  Autumn Conley 04/22/2021, 4:53 PM

## 2021-04-22 NOTE — Progress Notes (Signed)
      [address]       [place] [address]             [phone]      3 Days Post-Op Procedure(s) (LRB): attempted XI ROBOTIC ASSISTED THORASCOPY (Right) INTERCOSTAL NERVE BLOCK (Right) PERICARDIAL WINDOW WITH THORACOTOMY APPROACH (N/A)  Subjective:  Patient up in chair.  Still having pain, but its better.  She was up and ambulated in the hallway, but had to come back due to low oxygen levels.  Objective: Vital signs in last 24 hours: Temp:  [97.6 F (36.4 C)-98.3 F (36.8 C)] 98.3 F (36.8 C) (07/04 0738) Pulse Rate:  [62-70] 70 (07/04 0738) Cardiac Rhythm: Normal sinus rhythm (07/04 0700) Resp:  [12-21] 17 (07/04 0738) BP: (99-124)/(54-61) 124/55 (07/04 0738) SpO2:  [87 %-100 %] 95 % (07/04 0738) Weight:  [72.3 kg] 72.3 kg (07/04 0650)  Intake/Output from previous day: 07/03 0701 - 07/04 0700 In: 1200 [P.O.:1200] Out: 262 [Urine:2; Chest Tube:260]  General appearance: alert, cooperative, and no distress Heart: regular rate and rhythm Lungs: clear to auscultation bilaterally Wound: clean and dry left thoracotomy, some serous drainage around chest tube  Lab Results: Recent Labs    04/20/21 0040 04/21/21 0221  WBC 9.8 8.6  HGB 10.4* 9.4*  HCT 31.4* 28.3*  PLT 238 250   BMET:  Recent Labs    04/21/21 0221 04/22/21 0038  NA 132* 135  K 3.7 4.1  CL 99 104  CO2 24 27  GLUCOSE 102* 113*  BUN 25* 16  CREATININE 1.27* 0.78  CALCIUM 8.3* 8.7*    PT/INR: No results for input(s): LABPROT, INR in the last 72 hours. ABG    Component Value Date/Time   PHART 7.380 02/15/2019 0455   HCO3 25.9 02/15/2019 0455   O2SAT 97.1 02/15/2019 0455   CBG (last 3)  No results for input(s): GLUCAP in the last 72 hours.  Assessment/Plan: S/P Procedure(s) (LRB): attempted XI ROBOTIC ASSISTED THORASCOPY (Right) INTERCOSTAL NERVE BLOCK (Right) PERICARDIAL WINDOW WITH THORACOTOMY APPROACH (N/A)  Right Pleural Chest tube- is free from air leak on water  seal, Left pericardial tube is also free from air leak, output was 260 cc yesterday.. may be able to remove tubes soon Pathology, Cytology- remains pending Dispo- care per primary, leave chest tubes in place for now, no air leak present, output is decreasing, awaiting path/cytology results   LOS: 4 days    Ellwood Handler, PA-C 04/22/2021

## 2021-04-22 NOTE — Progress Notes (Addendum)
PROGRESS NOTE    Autumn Conley  VOZ:[id number] DOB: [birth year] DOA: 04/18/2021 PCP: [name], PA    Brief Narrative:  Autumn Conley was admitted to the hospital with the working diagnosis of large right pleural effusion and enlarging pericardial effusion without tamponade, in the setting of malignant thymoma. SP pericardial window and drainage of right pleural effusion.    75 year old female past medical history for post viral pericardial effusion, malignant thymoma status postradiation and chemotherapy, pulmonary fibrosis, hypertension, and dyslipidemia who presented with dyspnea.  She was diagnosed with pericardial effusion in April 2022.  Follow-up echocardiography in June 2022 showed worsening pericardial effusion.  She was placed on furosemide with no much improvement in her symptoms.  Because of persistent and worsening dyspnea she presented to the hospital.  On her initial physical examination blood pressure 157/75, heart rate 63, respiratory rate 21, oxygen saturation 99% she had decreased breath sounds bilaterally, increased work of breathing, heart S1-S2, positive rubs, no gallops, abdomen soft, no lower extremity edema.   Sodium 137, potassium 4.1, chloride 106, bicarb 25, glucose 107, BUN 13, creatinine 0.75, BNP 163, high sensitive troponin 9, white count 5.6, hemoglobin 10.7, hematocrit 32.6, platelets 235. SARS COVID-19 negative.   Chest radiograph with cardiomegaly, bilateral pleural effusions more right than left, right lower lobe, middle lobe atelectasis.   CT chest enlarging right-sided pleural effusion, right lower lobe middle lobe atelectasis.  Large pericardial effusion.   EKG 79 bpm, normal axis, normal intervals, sinus rhythm with PACs, no significant ST segment or T wave changes.  Low voltage.   Echocardiogram with worsening large pericardial effusion with no RA/RC collapse in diastole. LV EF 60 to 65%.   Patient underwent pericardial window and right pleural chest  tube placement with good toleration.     Patient developed hypotension responsive to IV fluids.    Assessment & Plan:   Active Problems:   Acute on chronic diastolic CHF (congestive heart failure) (HCC)   CHF (congestive heart failure) (HCC)   Pericardial effusion   Pleural effusion   S/P lung surgery, follow-up exam   Large right pleural effusion and pericardial effusion.  Today her right chest tube with no air leak. Pericardial drain documented output 260 ml.   Persistent pain at the right chest wall.   Pain control with hydromorphone for severe pain and will add oxycodone for moderate pain. Change acetaminophen to scheduled qid Continue with antitussive agents, airway clearing techniques with incentive spirometer.  Follow with today's echocardiogram. Continue out of bed to chair.  Follow with CT surgery recommendations.  Poor IV access will order PICC line    2. AKI due to hypovolemic hypotension/ hyponatremia.  Renal function with serum cr at 0,78 with K at 4,1 and serum bicarbonate at 27. Continue to have high output from chest tube.  Blood pressure 99 to 425 mmHg systolic.    Plan to decrease IV fluids to 50 ml per H and follow up renal function in am, avoid hypotension and nephrotoxic medications.  Continue to hold on diuretics and antihypertensive medications.    2. Acute on chronic diastolic heart failure.  sp pericardiocentesis. Volume sable today, continue to hold on antihypertensive medications due to risk of hypotension.    3. Thymoma. Chronic anemia Sp radiation and chemotherapy. Follow up as outpatient.   4. HTN/ dyslipidemia. Continue statin therapy. Hold on blood pressure medications.    5. Depression and anxiety. Continue with paroxetine and alprazolam.   Patient continue to be at high risk  for worsening hypotension and recurrent renal failure   Status is: Inpatient  Remains inpatient appropriate because:IV treatments appropriate due to intensity  of illness or inability to take PO  Dispo: The patient is from: Home              Anticipated d/c is to: Home              Patient currently is not medically stable to d/c.   Difficult to place patient No   DVT prophylaxis: Scd  Code Status:   full  Family Communication:  I spoke with patient's husband at the bedside, we talked in detail about patient's condition, plan of care and prognosis and all questions were addressed.     Consultants:  Cardiology  CT surgery   Procedures:  Pericardial drain    Subjective: Patient continue to have moderate to severe pain at the right side, pleuritic in nature., no nausea or vomiting, no dyspnea.   Objective: Vitals:   04/21/21 2349 04/22/21 0351 04/22/21 0650 04/22/21 0738  BP: (!) 99/56 (!) 103/59  (!) 124/55  Pulse: 66 62 65 70  Resp: 15 13 (!) 21 17  Temp: 97.6 F (36.4 C) 97.7 F (36.5 C)  98.3 F (36.8 C)  TempSrc: Oral Oral  Oral  SpO2: 96% 97% (!) 87% 95%  Weight:   72.3 kg   Height:        Intake/Output Summary (Last 24 hours) at 04/22/2021 0946 Last data filed at 04/22/2021 0500 Gross per 24 hour  Intake 720 ml  Output 262 ml  Net 458 ml   Filed Weights   04/20/21 0618 04/21/21 0300 04/22/21 0650  Weight: 70.9 kg 77.8 kg 72.3 kg    Examination:   General: deconditioned and in pain .  Neurology: Awake and alert, non focal  E ENT: mild pallor, no icterus, oral mucosa moist Cardiovascular: No JVD. S1-S2 present, rhythmic, no gallops, rubs, or murmurs. No lower extremity edema. Pulmonary: positive breath sounds bilaterally,  no wheezing, or rhonchi bibasilar rales. Gastrointestinal. Abdomen soft and non tender Skin. No rashes Musculoskeletal: no joint deformities Right chest tube and pericardial drain in place,     Data Reviewed: I have personally reviewed following labs and imaging studies  CBC: Recent Labs  Lab 04/18/21 1257 04/20/21 0040 04/21/21 0221  WBC 5.6 9.8 8.6  NEUTROABS 4.5  --   --   HGB  10.7* 10.4* 9.4*  HCT 32.6* 31.4* 28.3*  MCV 95.9 94.9 95.6  PLT 235 238 124   Basic Metabolic Panel: Recent Labs  Lab 04/18/21 1257 04/19/21 0337 04/20/21 0040 04/21/21 0221 04/22/21 0038  NA 137 139 133* 132* 135  K 4.1 3.5 3.8 3.7 4.1  CL 106 102 97* 99 104  CO2 25 29 26 24 27   GLUCOSE 107* 104* 173* 102* 113*  BUN 13 12 14  25* 16  CREATININE 0.76 0.83 0.85 1.27* 0.78  CALCIUM 9.1 9.3 8.6* 8.3* 8.7*   GFR: Estimated Creatinine Clearance: 57.9 mL/min (by C-G formula based on SCr of 0.78 mg/dL). Liver Function Tests: Recent Labs  Lab 04/21/21 0221  AST 19  ALT 24  ALKPHOS 92  BILITOT 0.7  PROT 5.4*  ALBUMIN 2.8*   No results for input(s): LIPASE, AMYLASE in the last 168 hours. No results for input(s): AMMONIA in the last 168 hours. Coagulation Profile: No results for input(s): INR, PROTIME in the last 168 hours. Cardiac Enzymes: No results for input(s): CKTOTAL, CKMB, CKMBINDEX, TROPONINI in the  last 168 hours. BNP (last 3 results) No results for input(s): PROBNP in the last 8760 hours. HbA1C: No results for input(s): HGBA1C in the last 72 hours. CBG: No results for input(s): GLUCAP in the last 168 hours. Lipid Profile: No results for input(s): CHOL, HDL, LDLCALC, TRIG, CHOLHDL, LDLDIRECT in the last 72 hours. Thyroid Function Tests: No results for input(s): TSH, T4TOTAL, FREET4, T3FREE, THYROIDAB in the last 72 hours. Anemia Panel: No results for input(s): VITAMINB12, FOLATE, FERRITIN, TIBC, IRON, RETICCTPCT in the last 72 hours.    Radiology Studies: I have reviewed all of the imaging during this hospital visit personally     Scheduled Meds:  acetaminophen  1,000 mg Oral Q6H   Or   acetaminophen (TYLENOL) oral liquid 160 mg/5 mL  1,000 mg Oral Q6H   vitamin C  1,000 mg Oral Daily   atorvastatin  40 mg Oral QHS   Chlorhexidine Gluconate Cloth  6 each Topical Q0600   cholecalciferol  5,000 Units Oral Daily   cyanocobalamin  1,000 mcg  Intramuscular Q30 days   docusate sodium  100 mg Oral BID   enoxaparin (LOVENOX) injection  40 mg Subcutaneous Q24H   guaiFENesin-dextromethorphan  5 mL Oral TID   mupirocin ointment  1 application Nasal BID   pantoprazole  40 mg Oral QAC breakfast   PARoxetine  60 mg Oral Daily   polyethylene glycol  17 g Oral Daily   sodium chloride flush  3 mL Intravenous Q12H   valACYclovir  1,000 mg Oral BID   vitamin E  400 Units Oral Daily   Continuous Infusions:  sodium chloride     sodium chloride 75 mL/hr at 04/21/21 2357     LOS: 4 days        Fateh Kindle Gerome Apley, MD

## 2021-04-22 NOTE — Progress Notes (Signed)
Physical Therapy Treatment Patient Details Name: Autumn Conley MRN: [id number] DOB: [birth year] Today's Date: 04/22/2021    History of Present Illness Pt is a 75 y.o. female admitted 04/18/21 with SOB. CXR showed cardiomegaly and moderate amount of pleural effusion (R>L). Workup for acute on chronic CHF decompensation secondary to increasing pericardial effusion and uncontrolled HTN. S/p  pericardial window and chest tube placement 7/1. 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    PT Comments    Continuing work on functional mobility and activity tolerance;  Overall moving well postop, with some mild unsteadiness with progressive ambulation; Noted O2 sat drop with activity, may need home O2 -- will continue to monitor O2 sats with activity   Follow Up Recommendations  No PT follow up     Equipment Recommendations    Supplemental O2 (see other note of this date for O2 qualifying walk)   Recommendations for Other Services       Precautions / Restrictions Precautions Precautions: Other (comment) Precaution Comments: Watch SpO2 (does not wear O2 baseline); 2 pleuravacs    Mobility  Bed Mobility                    Transfers Overall transfer level: Independent Equipment used: None             General transfer comment: no difficulties  Ambulation/Gait Ambulation/Gait assistance: Supervision;Min guard Gait Distance (Feet): 200 Feet Assistive device: None (occasional handheld assist) Gait Pattern/deviations: Step-through pattern     General Gait Details: Cues to self-monitor for activity tolerance; conversant during walk; needed supplemental O2; mild unsteadiness, especially with turns   Chief Strategy Officer    Modified Rankin (Stroke Patients Only)       Balance Overall balance assessment: Mild deficits observed, not  formally tested                                          Cognition Arousal/Alertness: Awake/alert Behavior During Therapy: WFL for tasks assessed/performed Overall Cognitive Status: Within Functional Limits for tasks assessed                                        Exercises      General Comments General comments (skin integrity, edema, etc.): Husband present and supportive      Pertinent Vitals/Pain Pain Assessment: 0-10 Pain Score: 2  Pain Location: Chest Pain Descriptors / Indicators: Tightness Pain Intervention(s): Monitored during session    Home Living                      Prior Function            PT Goals (current goals can now be found in the care plan section) Acute Rehab PT Goals Patient Stated Goal: Improved breathing and energy, return home PT Goal Formulation: With patient Time For Goal Achievement: 05/03/21 Potential to Achieve Goals: Good Progress towards PT goals: Progressing toward goals    Frequency    Min 3X/week      PT Plan Current plan remains appropriate    Co-evaluation  AM-PAC PT "6 Clicks" Mobility   Outcome Measure  Help needed turning from your back to your side while in a flat bed without using bedrails?: A Little Help needed moving from lying on your back to sitting on the side of a flat bed without using bedrails?: A Little Help needed moving to and from a bed to a chair (including a wheelchair)?: None Help needed standing up from a chair using your arms (e.g., wheelchair or bedside chair)?: None Help needed to walk in hospital room?: A Little Help needed climbing 3-5 steps with a railing? : A Little 6 Click Score: 20    End of Session Equipment Utilized During Treatment: Oxygen Activity Tolerance: Patient tolerated treatment well Patient left: in chair;with call bell/phone within reach;with family/visitor present Nurse Communication: Mobility status PT Visit  Diagnosis: Other abnormalities of gait and mobility (R26.89)     Time: 6886-4847 PT Time Calculation (min) (ACUTE ONLY): 25 min  Charges:  $Gait Training: 23-37 mins                     Roney Marion, [place]  Acute Rehabilitation Services Pager [phone] Office [place] 04/22/2021, 1:27 PM

## 2021-04-22 NOTE — Evaluation (Signed)
Occupational Therapy Evaluation Patient Details Name: Autumn Conley MRN: [id number] DOB: 09-17-1946 Today's Date: 04/22/2021    History of Present Illness Pt is a 75 y.o. female admitted 04/18/21 with SOB. CXR showed cardiomegaly and moderate amount of pleural effusion (R>L). Workup for acute on chronic CHF decompensation secondary to increasing pericardial effusion and uncontrolled HTN. S/p  pericardial window and chest tube placement 7/1. 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   Clinical Impression   Pt was independent prior to admission. Had been helping care for her daughter who was recently injured. Pt primarily limited by pain at chest tube site and generalized weakness. Pt needs up to min assist for ADL and supervision for ambulation to bathroom with mild unsteadiness. Pt likely to progress well and return to independence as chest tubes are removed.  Will follow acutely.     Follow Up Recommendations  No OT follow up    Equipment Recommendations  None recommended by OT    Recommendations for Other Services       Precautions / Restrictions Precautions Precautions: Other (comment) Precaution Comments: Watch SpO2 (does not wear O2 baseline); 2 pleuravacs      Mobility Bed Mobility Overal bed mobility: Needs Assistance Bed Mobility: Supine to Sit;Sit to Supine     Supine to sit: Min assist Sit to supine: Min guard   General bed mobility comments: pulled trunk up on therapist's hand    Transfers Overall transfer level: Independent Equipment used: None             General transfer comment: no difficulties    Balance Overall balance assessment: Mild deficits observed, not formally tested                                         ADL either performed or assessed with clinical judgement   ADL Overall ADL's : Needs  assistance/impaired Eating/Feeding: Independent;Sitting   Grooming: Supervision/safety   Upper Body Bathing: Minimal assistance;Sitting   Lower Body Bathing: Minimal assistance;Sit to/from stand   Upper Body Dressing : Minimal assistance;Sitting   Lower Body Dressing: Minimal assistance;Sit to/from stand       Toileting- Water quality scientist and Hygiene: Sit to/from stand;Minimal assistance       Functional mobility during ADLs: Supervision/safety General ADL Comments: Pt instructed in pursed lip breathing, self monitoring when needing rest break.     Vision Baseline Vision/History: Wears glasses Patient Visual Report: No change from baseline       Perception     Praxis      Pertinent Vitals/Pain Pain Assessment: Faces Pain Score: 2  Faces Pain Scale: Hurts even more Pain Location: chest tube site Pain Descriptors / Indicators: Stabbing Pain Intervention(s): Monitored during session;Repositioned     Hand Dominance Right   Extremity/Trunk Assessment Upper Extremity Assessment Upper Extremity Assessment: Overall WFL for tasks assessed   Lower Extremity Assessment Lower Extremity Assessment: Defer to PT evaluation   Cervical / Trunk Assessment Cervical / Trunk Assessment: Normal   Communication Communication Communication: HOH (has B hearing aids)   Cognition Arousal/Alertness: Awake/alert Behavior During Therapy: WFL for tasks assessed/performed Overall Cognitive Status: Within Functional Limits for tasks assessed  General Comments  Husband present and supportive    Exercises     Shoulder Instructions      Home Living Family/patient expects to be discharged to:: Private residence Living Arrangements: Spouse/significant other Available Help at Discharge: Family;Available 24 hours/day Type of Home: House Home Access: Stairs to enter [employer] of Steps: 3-4 Entrance Stairs-Rails:  Left Home Layout: One level     Bathroom Shower/Tub: Occupational psychologist: Handicapped height     Home Equipment: Shower seat;Cane - single point          Prior Functioning/Environment Level of Independence: Independent        Comments: Independent without; husband assists as needed if fatigued        OT Problem List: Decreased activity tolerance;Pain      OT Treatment/Interventions: Self-care/ADL training;Therapeutic activities;Patient/family education;Balance training;Energy conservation    OT Goals(Current goals can be found in the care plan section) Acute Rehab OT Goals Patient Stated Goal: Improved breathing and energy, return home OT Goal Formulation: With patient Time For Goal Achievement: 05/06/21 Potential to Achieve Goals: Good ADL Goals Pt Will Perform Grooming: Independently;standing Pt Will Perform Lower Body Bathing: Independently;sit to/from stand Pt Will Perform Lower Body Dressing: Independently;sit to/from stand Pt Will Transfer to Toilet: Independently;ambulating;regular height toilet Pt Will Perform Toileting - Clothing Manipulation and hygiene: Independently;sit to/from stand Additional ADL Goal #1: Pt will generalize breathing and energy conservation strategies.  OT Frequency: Min 2X/week   Barriers to D/C:            Co-evaluation              AM-PAC OT "6 Clicks" Daily Activity     Outcome Measure Help from another person eating meals?: None Help from another person taking care of personal grooming?: A Little Help from another person toileting, which includes using toliet, bedpan, or urinal?: A Little Help from another person bathing (including washing, rinsing, drying)?: A Little Help from another person to put on and taking off regular upper body clothing?: A Little Help from another person to put on and taking off regular lower body clothing?: A Little 6 Click Score: 19   End of Session    Activity Tolerance:  Patient limited by fatigue Patient left: in bed;with call bell/phone within reach  OT Visit Diagnosis: Muscle weakness (generalized) (M62.81);Pain                Time: 1330-1350 OT Time Calculation (min): 20 min Charges:  OT General Charges $OT Visit: 1 Visit OT Evaluation $OT Eval Moderate Complexity: 1 Mod  Nestor Lewandowsky, OTR/L Acute Rehabilitation Services Pager: [phone] Office: [phone]  Malka So 04/22/2021, 2:09 PM

## 2021-04-22 NOTE — Progress Notes (Signed)
     [address]       [place] [address]             [phone]       CT output decreased Aggressive ambulation today Will likely remove tubes sequentially starting tomorrow.  Autumn Conley Bary Leriche

## 2021-04-22 NOTE — Progress Notes (Signed)
Physical Therapy Treatment Note  (Full treatment note to follow)  SATURATION QUALIFICATIONS: (This note is used to comply with regulatory documentation for home oxygen)  Patient Saturations on Room Air at Rest = 90%  Patient Saturations on Room Air while Ambulating = 82%  Patient Saturations on 3 Liters of oxygen while Ambulating = 94%  Please briefly explain why patient needs home oxygen: Patient requires supplemental oxygen to maintain oxygen saturations at acceptable, safe levels with physical activity.  Autumn Conley, [place]  Acute Rehabilitation Services Pager [phone] Office [phone]

## 2021-04-22 NOTE — Progress Notes (Signed)
Peripherally Inserted Central Catheter Placement  The IV Nurse has discussed with the patient and/or persons authorized to consent for the patient, the purpose of this procedure and the potential benefits and risks involved with this procedure.  The benefits include less needle sticks, lab draws from the catheter, and the patient may be discharged home with the catheter. Risks include, but not limited to, infection, bleeding, blood clot (thrombus formation), and puncture of an artery; nerve damage and irregular heartbeat and possibility to perform a PICC exchange if needed/ordered by physician.  Alternatives to this procedure were also discussed.  Bard Power PICC patient education guide, fact sheet on infection prevention and patient information card has been provided to patient /or left at bedside.    PICC Placement Documentation  PICC Single Lumen 20/94/70 Right Basilic 38 cm 0 cm (Active)  Indication for Insertion or Continuance of Line Poor Vasculature-patient has had multiple peripheral attempts or PIVs lasting less than 24 hours 04/22/21 1432  Exposed Catheter (cm) 0 cm 04/22/21 1432  Site Assessment Clean;Dry;Intact 04/22/21 1432  Line Status Saline locked;Blood return noted;Flushed 04/22/21 1432  Dressing Type Transparent 04/22/21 1432  Dressing Status Clean;Dry;Intact 04/22/21 1432  Antimicrobial disc in place? Yes 04/22/21 1432  Safety Lock Not Applicable 96/28/36 6294  Line Care Connections checked and tightened 04/22/21 1432  Line Adjustment (NICU/IV Team Only) No 04/22/21 1432  Dressing Intervention New dressing 04/22/21 1432  Dressing Change Due 04/29/21 04/22/21 [name], Nicolette Bang 04/22/2021, 2:34 PM

## 2021-04-23 ENCOUNTER — Inpatient Hospital Stay: Payer: Self-pay

## 2021-04-23 ENCOUNTER — Inpatient Hospital Stay (HOSPITAL_COMMUNITY): Payer: Medicare Other

## 2021-04-23 LAB — BASIC METABOLIC PANEL
Anion gap: 6 (ref 5–15)
BUN: 12 mg/dL (ref 8–23)
CO2: 29 mmol/L (ref 22–32)
Calcium: 8.9 mg/dL (ref 8.9–10.3)
Chloride: 102 mmol/L (ref 98–111)
Creatinine, Ser: 0.56 mg/dL (ref 0.44–1.00)
GFR, Estimated: >60 mL/min (ref >60)
Glucose, Bld: 108 mg/dL — ABNORMAL HIGH (ref 70–99)
Potassium: 3.8 mmol/L (ref 3.5–5.1)
Sodium: 137 mmol/L (ref 135–145)

## 2021-04-23 LAB — CBC WITH DIFFERENTIAL/PLATELET
Abs Immature Granulocytes: 0.05 10*3/uL (ref 0.00–0.07)
Basophils Absolute: 0 10*3/uL (ref 0.0–0.1)
Basophils Relative: 1 %
Eosinophils Absolute: 0.1 10*3/uL (ref 0.0–0.5)
Eosinophils Relative: 3 %
HCT: 31.2 % — ABNORMAL LOW (ref 36.0–46.0)
Hemoglobin: 10.1 g/dL — ABNORMAL LOW (ref 12.0–15.0)
Immature Granulocytes: 1 %
Lymphocytes Relative: 14 %
Lymphs Abs: 0.7 10*3/uL (ref 0.7–4.0)
MCH: 31.5 pg (ref 26.0–34.0)
MCHC: 32.4 g/dL (ref 30.0–36.0)
MCV: 97.2 fL (ref 80.0–100.0)
Monocytes Absolute: 0.5 10*3/uL (ref 0.1–1.0)
Monocytes Relative: 11 %
Neutro Abs: 3.5 10*3/uL (ref 1.7–7.7)
Neutrophils Relative %: 70 %
Platelets: 240 10*3/uL (ref 150–400)
RBC: 3.21 MIL/uL — ABNORMAL LOW (ref 3.87–5.11)
RDW: 14.3 % (ref 11.5–15.5)
WBC: 4.9 10*3/uL (ref 4.0–10.5)
nRBC: 0 % (ref 0.0–0.2)

## 2021-04-23 LAB — SURGICAL PATHOLOGY

## 2021-04-23 LAB — CYTOLOGY - NON PAP

## 2021-04-23 NOTE — Care Management Important Message (Signed)
Important Message  Patient Details  Name: Autumn Conley MRN: [id number] Date of Birth: 08-Nov-1945   Medicare Important Message Given:  Yes     Barb Merino [place] 04/23/2021, 3:09 PM

## 2021-04-23 NOTE — Progress Notes (Signed)
Progress Note  Patient Name: Autumn Conley Date of Encounter: 04/23/2021  [facility] Cardiologist: [name], MD   Subjective  75 year old female with recent pericardial tamponade.  She is status post orbotic assisted pericardial window.  Repeat echocardiogram yesterday reveals a trivial pericardial effusion. Remains in NSR    Inpatient Medications    Scheduled Meds:  acetaminophen  650 mg Oral QID   vitamin C  1,000 mg Oral Daily   atorvastatin  40 mg Oral QHS   Chlorhexidine Gluconate Cloth  6 each Topical Q0600   cholecalciferol  5,000 Units Oral Daily   cyanocobalamin  1,000 mcg Intramuscular Q30 days   docusate sodium  100 mg Oral BID   enoxaparin (LOVENOX) injection  40 mg Subcutaneous Q24H   guaiFENesin-dextromethorphan  5 mL Oral TID   mupirocin ointment  1 application Nasal BID   pantoprazole  40 mg Oral QAC breakfast   PARoxetine  60 mg Oral Daily   polyethylene glycol  17 g Oral Daily   sodium chloride flush  10-40 mL Intracatheter Q12H   sodium chloride flush  3 mL Intravenous Q12H   valACYclovir  1,000 mg Oral BID   vitamin E  400 Units Oral Daily   Continuous Infusions:  sodium chloride     sodium chloride 75 mL/hr at 04/21/21 2357   PRN Meds: sodium chloride, ALPRAZolam, alum & mag hydroxide-simeth, butalbital-acetaminophen-caffeine, HYDROmorphone (DILAUDID) injection, ondansetron (ZOFRAN) IV, oxyCODONE, senna-docusate, sodium chloride flush, sodium chloride flush, SUMAtriptan   Vital Signs    Vitals:   04/22/21 2020 04/22/21 2239 04/23/21 0328 04/23/21 0457  BP: (!) 182/96 130/74 136/70   Pulse: 93 100 71   Resp: 15 14 17    Temp: 98.5 F (36.9 C) 98.8 F (37.1 C) 98.5 F (36.9 C)   TempSrc: Oral Oral Oral   SpO2:  95% 97%   Weight:    72.8 kg  Height:        Intake/Output Summary (Last 24 hours) at 04/23/2021 6803 Last data filed at 04/23/2021 0330 Gross per 24 hour  Intake --  Output 1790 ml  Net -1790 ml   Last 3 Weights  04/23/2021 04/22/2021 04/21/2021  Weight (lbs) 160 lb 7.9 oz 159 lb 6.3 oz 171 lb 8.3 oz  Weight (kg) 72.8 kg 72.3 kg 77.8 kg      Telemetry    NSR  - Personally Reviewed  ECG     - Personally Reviewed  Physical Exam   GEN: Elderly female, mildly uncomfortable, no acute distress Neck: No JVD Cardiac: RRR, 1/6 to 2/6 systolic murmur. Respiratory: Clear to auscultation bilaterally. GI: Soft, nontender, non-distended  MS: No edema; No deformity. Neuro:  Nonfocal  Psych: Normal affect   Labs    High Sensitivity Troponin:   Recent Labs  Lab 04/18/21 1257  TROPONINIHS 9      Chemistry Recent Labs  Lab 04/21/21 0221 04/22/21 0038 04/23/21 0425  NA 132* 135 137  K 3.7 4.1 3.8  CL 99 104 102  CO2 24 27 29   GLUCOSE 102* 113* 108*  BUN 25* 16 12  CREATININE 1.27* 0.78 0.56  CALCIUM 8.3* 8.7* 8.9  PROT 5.4*  --   --   ALBUMIN 2.8*  --   --   AST 19  --   --   ALT 24  --   --   ALKPHOS 92  --   --   BILITOT 0.7  --   --   GFRNONAA 44* >60 >  60  ANIONGAP 9 4* 6     Hematology Recent Labs  Lab 04/20/21 0040 04/21/21 0221 04/23/21 0425  WBC 9.8 8.6 4.9  RBC 3.31* 2.96* 3.21*  HGB 10.4* 9.4* 10.1*  HCT 31.4* 28.3* 31.2*  MCV 94.9 95.6 97.2  MCH 31.4 31.8 31.5  MCHC 33.1 33.2 32.4  RDW 14.5 14.6 14.3  PLT 238 250 240    BNP Recent Labs  Lab 04/18/21 1257  BNP 163.9*     DDimer No results for input(s): DDIMER in the last 168 hours.   Radiology    DG Chest 2 View  Result Date: 04/23/2021 CLINICAL DATA:  Follow-up pleural effusion. EXAM: CHEST - 2 VIEW COMPARISON:  04/22/2021. FINDINGS: Right PICC line and bilateral chest tubes in stable position. No pneumothorax. Heart size stable. Persistent bibasilar atelectasis/infiltrates, particularly on the right. No interim change. Persistent small bilateral pleural effusions. Surgical clips left chest. IMPRESSION: 1. Right PICC line and bilateral chest tubes in stable position. No pneumothorax. 2. Low lung volumes  with persistent bibasilar atelectasis/infiltrates, particularly on the right. No interim change. Persistent small bilateral pleural effusions. Electronically Signed   By: Coburn   On: 04/23/2021 06:30   DG CHEST PORT 1 VIEW  Result Date: 04/22/2021 CLINICAL DATA:  PICC line placement. EXAM: PORTABLE CHEST 1 VIEW COMPARISON:  04/21/2021 FINDINGS: Placement of a right arm PICC line. Catheter tip is in the region of the superior cavoatrial junction. Stable position of the right chest tube without a pneumothorax. Persistent densities in the right hilar region which have minimally changed. Again noted is a left-sided chest tube. Persistent densities at the left lung base. Cardiac silhouette is stable. IMPRESSION: 1. PICC line tip at the superior cavoatrial junction. 2. Stable appearance of the chest tubes without pneumothorax. 3. No significant change in the pleural and parenchymal lung densities. Electronically Signed   By: Markus Daft M.D.   On: 04/22/2021 15:27   ECHOCARDIOGRAM LIMITED  Result Date: 04/22/2021    ECHOCARDIOGRAM LIMITED REPORT   Patient Name:   SAMIYA Conley Date of Exam: 04/22/2021 Medical Rec #:  [id number]   Height:       63.0 in Accession #:    [id number]  Weight:       159.4 lb Date of Birth:  [birth year]   BSA:          1.756 m Patient Age:    40 years    BP:           115/94 mmHg Patient Gender: F           HR:           75 bpm. Exam Location:  Inpatient Procedure: 2D Echo and Color Doppler Indications:    Pericardial effusion  History:        Patient has prior history of Echocardiogram examinations, most                 recent 04/18/2021. Risk Factors:Hypertension and Dyslipidemia.                 Breast cancer. S/P pericardial window.  Sonographer:    Clayton Lefort RDCS (AE) Referring Phys: [id number] Nadean Corwin HILTY  Sonographer Comments: Bandages in apical and subcostal area, limiting imaging windows. IMPRESSIONS  1. Left ventricular ejection fraction, by estimation, is 60 to 65%. The left  ventricle has normal function. The left ventricle has no regional wall motion abnormalities. There is moderate concentric left ventricular hypertrophy.  2. Right  ventricular systolic function is normal. The right ventricular size is normal.  3. The mitral valve is abnormal. Moderate mitral annular calcification.  4. The aortic valve was not well visualized. Aortic valve regurgitation is mild.  5. The inferior vena cava is normal in size with <50% respiratory variability, suggesting right atrial pressure of 8 mmHg. Comparison(s): A prior study was performed on 03/22/21. Prior images reviewed side by side. Compared to prior, significant improvement in pericardial effusion; decrease in IVC diameter. FINDINGS  Left Ventricle: Left ventricular ejection fraction, by estimation, is 60 to 65%. The left ventricle has normal function. The left ventricle has no regional wall motion abnormalities. There is moderate concentric left ventricular hypertrophy. Right Ventricle: The right ventricular size is normal. Right ventricular systolic function is normal. Pericardium: Trivial pericardial effusion is present. Mitral Valve: The mitral valve is abnormal. Moderate mitral annular calcification. Tricuspid Valve: The tricuspid valve is not well visualized. Aortic Valve: The aortic valve was not well visualized. Aortic valve regurgitation is mild. Venous: The inferior vena cava is normal in size with less than 50% respiratory variability, suggesting right atrial pressure of 8 mmHg. LEFT VENTRICLE PLAX 2D LVIDd:         3.60 cm LVIDs:         2.50 cm LV PW:         1.30 cm LV IVS:        1.30 cm  IVC IVC diam: 1.20 cm LEFT ATRIUM         Index LA diam:    4.00 cm 2.28 cm/m Rudean Haskell MD Electronically signed by Rudean Haskell MD Signature Date/Time: 04/22/2021/5:06:32 PM    Final    [place] EKG SITE RITE  Result Date: 04/22/2021 If Site Rite image not attached, placement could not be confirmed due to current cardiac rhythm.    Cardiac Studies     Patient Profile     75 y.o. female who was admitted with a large pericardial effusion.  She is status post robotic pericardial window.  Assessment & Plan    1.  Pericardial effusion: She status post robotic pericardial window.  Repeat echo  Wilburn Mylar reveals a trivial pericardial effusion.  Pain remains her main concern   Has leaking from her right chest tub.  The left chest tube is scheduled to be pulled today   At this point , her cardiac status is stable We will sign off Please call for questions.   [facility] will sign off.   Medication Recommendations:  cont current meds  Other recommendations (labs, testing, etc):   With Dr. [name]. Follow up as an outpatient:     For questions or updates, please contact [facility] Please consult [email or web address] for contact info under        Signed, Mertie Moores, MD  04/23/2021, 8:29 AM

## 2021-04-23 NOTE — Progress Notes (Addendum)
      [id number]       [place] [address]             [phone]       4 Days Post-Op Procedure(s) (LRB): attempted XI ROBOTIC ASSISTED THORASCOPY (Right) INTERCOSTAL NERVE BLOCK (Right) PERICARDIAL WINDOW WITH THORACOTOMY APPROACH (N/A)  Subjective: Patient states "back chest tube draining around dressing;just changed".  Objective: Vital signs in last 24 hours: Temp:  [98 F (36.7 C)-98.8 F (37.1 C)] 98.5 F (36.9 C) (07/05 0328) Pulse Rate:  [66-100] 71 (07/05 0328) Cardiac Rhythm: Normal sinus rhythm (07/05 0328) Resp:  [14-17] 17 (07/05 0328) BP: (115-182)/(55-96) 136/70 (07/05 0328) SpO2:  [95 %-99 %] 97 % (07/05 0328) Weight:  [72.8 kg] 72.8 kg (07/05 0457)     Intake/Output from previous day: 07/04 0701 - 07/05 0700 In: -  Out: 6378 [Urine:1650; Chest Tube:140]   Physical Exam:  Cardiovascular: RRR Pulmonary: Clear to auscultation bilaterally Abdomen: Soft, non tender, bowel sounds present. Extremities: No lower extremity edema. Wounds: Dressing is clean and dry.   Chest Tubes: to water seal, no air leak  Lab Results: CBC: Recent Labs    04/21/21 0221 04/23/21 0425  WBC 8.6 4.9  HGB 9.4* 10.1*  HCT 28.3* 31.2*  PLT 250 240   BMET:  Recent Labs    04/22/21 0038 04/23/21 0425  NA 135 137  K 4.1 3.8  CL 104 102  CO2 27 29  GLUCOSE 113* 108*  BUN 16 12  CREATININE 0.78 0.56  CALCIUM 8.7* 8.9    PT/INR: No results for input(s): LABPROT, INR in the last 72 hours. ABG:  INR: Will add last result for INR, ABG once components are confirmed Will add last 4 CBG results once components are confirmed  Assessment/Plan:  1. CV - SR with HR in the 70's. Echo done yesterday and results showed LVEF 60-65%, moderate LVH, and significant improvement in pericardial effusion.  2.  Pulmonary - Chest tubes with 140 cc last 24 hours. Chest tubes are to water seal;no air leak. CXR this am shows low lung volumes, no pneumothorax,  bibasilar atelectasis, and small bilateral pleura effusions. Hope to remove 1 chest tube. Check CXR in am. Await pathology result from pericardial fluid. Encourage incentive spirometer.  3. H and H stable at 10.1 and 31.2   Donielle M ZimmermanPA-C 04/23/2021,7:17 AM [phone]   Agree with above Will remove Left chest tube today  Cinde Ebert O Elie Leppo

## 2021-04-23 NOTE — Plan of Care (Signed)

## 2021-04-23 NOTE — Progress Notes (Signed)
PROGRESS NOTE    Autumn Conley  CXK:[id number] DOB: May 29, 1946 DOA: 04/18/2021 PCP: [name], PA    Brief Narrative:  Mrs. Beechy was admitted to the hospital with the working diagnosis of large right pleural effusion and enlarging pericardial effusion without tamponade, in the setting of malignant thymoma. SP pericardial window and drainage of right pleural effusion.    75 year old female past medical history for post viral pericardial effusion, malignant thymoma status postradiation and chemotherapy, pulmonary fibrosis, hypertension, and dyslipidemia who presented with dyspnea.  She was diagnosed with pericardial effusion in April 2022.  Follow-up echocardiography in June 2022 showed worsening pericardial effusion.  She was placed on furosemide with no much improvement in her symptoms.  Because of persistent and worsening dyspnea she presented to the hospital.  On her initial physical examination blood pressure 157/75, heart rate 63, respiratory rate 21, oxygen saturation 99% she had decreased breath sounds bilaterally, increased work of breathing, heart S1-S2, positive rubs, no gallops, abdomen soft, no lower extremity edema.   Sodium 137, potassium 4.1, chloride 106, bicarb 25, glucose 107, BUN 13, creatinine 0.75, BNP 163, high sensitive troponin 9, white count 5.6, hemoglobin 10.7, hematocrit 32.6, platelets 235. SARS COVID-19 negative.   Chest radiograph with cardiomegaly, bilateral pleural effusions more right than left, right lower lobe, middle lobe atelectasis.   CT chest enlarging right-sided pleural effusion, right lower lobe middle lobe atelectasis.  Large pericardial effusion.   EKG 79 bpm, normal axis, normal intervals, sinus rhythm with PACs, no significant ST segment or T wave changes.  Low voltage.   Echocardiogram with worsening large pericardial effusion with no RA/RC collapse in diastole. LV EF 60 to 65%.   Patient underwent pericardial window and right pleural chest  tube placement with good toleration.     Patient developed hypotension responsive to IV fluids.      Assessment & Plan:   Active Problems:   Acute on chronic diastolic CHF (congestive heart failure) (HCC)   CHF (congestive heart failure) (HCC)   Pericardial effusion   Pleural effusion   S/P lung surgery, follow-up exam   Large right pleural effusion and pericardial effusion.  Chest film personally reviewed, chest tube and pericardial drain in place, no pneumothorax.  Right lower lobe atelectasis and small bilateral pleural effusions.    Chest pain has improved with analgesics. Continue with as needed IV hydromorphone for severe pain and oral oxycodone for moderate pain. On antitussive agents, airway clearing techniques with incentive spirometer. Scheduled acetaminophen.    Echocardiogram with significant improvement in pericardial effusion.    2. AKI due to hypovolemic hypotension/ hyponatremia.  Patient is tolerating po well today, her renal function with serum cr at 0,56, K 3,8 and serum bicarbonate at 29,    Continue to hold  antihypertensive agents Discontinue IV fluids and follow renal panel in am, avoid hypotension and nephrotoxic medications.    2. Acute on chronic diastolic heart failure.  sp pericardiocentesis. Follow up echocardiogram with preserved LV systolic function. No clinical signs of hypervolemia.  Plan to hold on IV fluids, continue blood pressure monitoring.    3. Thymoma. Chronic anemia Sp radiation and chemotherapy. Follow up as outpatient.   4. HTN/ dyslipidemia.  On statin/ Holding blood pressure medications due to hypotension.    5. Depression and anxiety. On  paroxetine and alprazolam.    Status is: Inpatient  Remains inpatient appropriate because:Inpatient level of care appropriate due to severity of illness  Dispo: The patient is from: Home  Anticipated d/c is to: Home              Patient currently is not medically stable  to d/c.   Difficult to place patient No   DVT prophylaxis: Enoxaparin   Code Status:   full  Family Communication:  I spoke with patient's husband at the bedside, we talked in detail about patient's condition, plan of care and prognosis and all questions were addressed.   Consultants:  Cardiology  CT surgery   Procedures:  Pericardial drain Right chest tube      Subjective: Patient continue to be very weak and deconditioned, her chest pain has improved with analgesics but not resolved.,   Objective: Vitals:   04/22/21 2020 04/22/21 2239 04/23/21 0328 04/23/21 0457  BP: (!) 182/96 130/74 136/70   Pulse: 93 100 71   Resp: 15 14 17    Temp: 98.5 F (36.9 C) 98.8 F (37.1 C) 98.5 F (36.9 C)   TempSrc: Oral Oral Oral   SpO2:  95% 97%   Weight:    72.8 kg  Height:        Intake/Output Summary (Last 24 hours) at 04/23/2021 1024 Last data filed at 04/23/2021 9326 Gross per 24 hour  Intake 13 ml  Output 1790 ml  Net -1777 ml   Filed Weights   04/21/21 0300 04/22/21 0650 04/23/21 0457  Weight: 77.8 kg 72.3 kg 72.8 kg    Examination:   General: Not in pain or dyspnea, deconditioned and ill looking appearing  Neurology: Awake and alert, non focal  E ENT: positive pallor, no icterus, oral mucosa moist Cardiovascular: No JVD. S1-S2 present, rhythmic, no gallops, rubs, or murmurs. No lower extremity edema. Pulmonary: positive breath sounds bilaterally, adequate air movement, no wheezing, or rhonchi, but bilateral rales. Gastrointestinal. Abdomen soft and non tender Skin. No rashes Musculoskeletal: no joint deformities     Data Reviewed: I have personally reviewed following labs and imaging studies  CBC: Recent Labs  Lab 04/18/21 1257 04/20/21 0040 04/21/21 0221 04/23/21 0425  WBC 5.6 9.8 8.6 4.9  NEUTROABS 4.5  --   --  3.5  HGB 10.7* 10.4* 9.4* 10.1*  HCT 32.6* 31.4* 28.3* 31.2*  MCV 95.9 94.9 95.6 97.2  PLT 235 238 250 712   Basic Metabolic  Panel: Recent Labs  Lab 04/19/21 0337 04/20/21 0040 04/21/21 0221 04/22/21 0038 04/23/21 0425  NA 139 133* 132* 135 137  K 3.5 3.8 3.7 4.1 3.8  CL 102 97* 99 104 102  CO2 29 26 24 27 29   GLUCOSE 104* 173* 102* 113* 108*  BUN 12 14 25* 16 12  CREATININE 0.83 0.85 1.27* 0.78 0.56  CALCIUM 9.3 8.6* 8.3* 8.7* 8.9   GFR: Estimated Creatinine Clearance: 58.1 mL/min (by C-G formula based on SCr of 0.56 mg/dL). Liver Function Tests: Recent Labs  Lab 04/21/21 0221  AST 19  ALT 24  ALKPHOS 92  BILITOT 0.7  PROT 5.4*  ALBUMIN 2.8*   No results for input(s): LIPASE, AMYLASE in the last 168 hours. No results for input(s): AMMONIA in the last 168 hours. Coagulation Profile: No results for input(s): INR, PROTIME in the last 168 hours. Cardiac Enzymes: No results for input(s): CKTOTAL, CKMB, CKMBINDEX, TROPONINI in the last 168 hours. BNP (last 3 results) No results for input(s): PROBNP in the last 8760 hours. HbA1C: No results for input(s): HGBA1C in the last 72 hours. CBG: No results for input(s): GLUCAP in the last 168 hours. Lipid Profile: No results for  input(s): CHOL, HDL, LDLCALC, TRIG, CHOLHDL, LDLDIRECT in the last 72 hours. Thyroid Function Tests: No results for input(s): TSH, T4TOTAL, FREET4, T3FREE, THYROIDAB in the last 72 hours. Anemia Panel: No results for input(s): VITAMINB12, FOLATE, FERRITIN, TIBC, IRON, RETICCTPCT in the last 72 hours.    Radiology Studies: I have reviewed all of the imaging during this hospital visit personally     Scheduled Meds:  acetaminophen  650 mg Oral QID   vitamin C  1,000 mg Oral Daily   atorvastatin  40 mg Oral QHS   Chlorhexidine Gluconate Cloth  6 each Topical Q0600   cholecalciferol  5,000 Units Oral Daily   cyanocobalamin  1,000 mcg Intramuscular Q30 days   docusate sodium  100 mg Oral BID   enoxaparin (LOVENOX) injection  40 mg Subcutaneous Q24H   guaiFENesin-dextromethorphan  5 mL Oral TID   mupirocin ointment  1  application Nasal BID   pantoprazole  40 mg Oral QAC breakfast   PARoxetine  60 mg Oral Daily   polyethylene glycol  17 g Oral Daily   sodium chloride flush  10-40 mL Intracatheter Q12H   sodium chloride flush  3 mL Intravenous Q12H   valACYclovir  1,000 mg Oral BID   vitamin E  400 Units Oral Daily   Continuous Infusions:  sodium chloride     sodium chloride 75 mL/hr at 04/21/21 2357     LOS: 5 days        Jahvon Gosline Gerome Apley, MD

## 2021-04-24 ENCOUNTER — Inpatient Hospital Stay (HOSPITAL_COMMUNITY): Payer: Medicare Other

## 2021-04-24 LAB — BASIC METABOLIC PANEL
Anion gap: 5 (ref 5–15)
BUN: 8 mg/dL (ref 8–23)
CO2: 32 mmol/L (ref 22–32)
Calcium: 9.3 mg/dL (ref 8.9–10.3)
Chloride: 100 mmol/L (ref 98–111)
Creatinine, Ser: 0.64 mg/dL (ref 0.44–1.00)
GFR, Estimated: >60 mL/min (ref >60)
Glucose, Bld: 116 mg/dL — ABNORMAL HIGH (ref 70–99)
Potassium: 4.2 mmol/L (ref 3.5–5.1)
Sodium: 137 mmol/L (ref 135–145)

## 2021-04-24 MED ORDER — POLYETHYLENE GLYCOL 3350 17 G PO PACK
17.0000 g | PACK | Freq: Two times a day (BID) | ORAL | Status: DC
  Administered 2021-04-24 – 2021-04-25 (×3): 17 g via ORAL
  Filled 2021-04-24 (×3): qty 1

## 2021-04-24 MED ORDER — SENNOSIDES-DOCUSATE SODIUM 8.6-50 MG PO TABS
2.0000 | ORAL_TABLET | Freq: Two times a day (BID) | ORAL | Status: DC
  Administered 2021-04-24 – 2021-04-25 (×3): 2 via ORAL
  Filled 2021-04-24 (×3): qty 2

## 2021-04-24 MED ORDER — CHLORHEXIDINE GLUCONATE CLOTH 2 % EX PADS
6.0000 | MEDICATED_PAD | Freq: Every day | CUTANEOUS | Status: DC
  Administered 2021-04-24 – 2021-04-25 (×2): 6 via TOPICAL

## 2021-04-24 NOTE — Progress Notes (Signed)
Physical Therapy Treatment Patient Details Name: Autumn Conley MRN: [id number] DOB: June 17, 1946 Today's Date: 04/24/2021    History of Present Illness Pt is a 75 y.o. female admitted 04/18/21 with SOB. CXR showed cardiomegaly and moderate amount of pleural effusion (R>L). Workup for acute on chronic CHF decompensation secondary to increasing pericardial effusion and uncontrolled HTN. S/p  pericardial window and chest tube placement 7/1. 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    PT Comments    Pt progressing well toward goals.  She was very steady during her standing exercise generally not needing the AD for stability and completing each with relative ease.  Also emphasized transfer training/safety and progression of gait stability/stamina.   Follow Up Recommendations  No PT follow up     Equipment Recommendations  Other (comment) (TBD)    Recommendations for Other Services       Precautions / Restrictions Precautions Precaution Comments: Watch SpO2 (does not wear O2 baseline); 2 pleuravacs    Mobility  Bed Mobility Overal bed mobility: Needs Assistance         Sit to supine: Min guard   General bed mobility comments: still pulled trunk up on therapist's hand    Transfers Overall transfer level: Independent                  Ambulation/Gait Ambulation/Gait assistance: Supervision Gait Distance (Feet): 500 Feet Assistive device: None;Rolling walker (2 wheeled) Gait Pattern/deviations: Step-through pattern   Gait velocity interpretation: 1.31 - 2.62 ft/sec, indicative of limited community ambulator General Gait Details: Steady gait, pt able to change gait speed appreciably with VSS throughout.   Stairs             Wheelchair Mobility    Modified Rankin (Stroke Patients Only)       Balance Overall balance assessment: Independent                                           Cognition Arousal/Alertness: Awake/alert Behavior During Therapy: WFL for tasks assessed/performed Overall Cognitive Status: Within Functional Limits for tasks assessed                                        Exercises General Exercises - Lower Extremity Hip ABduction/ADduction: AROM;Strengthening;Both;10 reps;Standing Hip Flexion/Marching: AROM;Strengthening;Both;10 reps;Standing Toe Raises: AROM;10 reps;Both;Standing Heel Raises: AROM;Strengthening Mini-Sqauts: AROM;10 reps;Standing Other Exercises Other Exercises: standing push ups into stabilized RW.    General Comments General comments (skin integrity, edema, etc.): sats held at about 90/91 % on RA while ambulating      Pertinent Vitals/Pain Pain Assessment: Faces Faces Pain Scale: No hurt Pain Intervention(s): Monitored during session    Home Living                      Prior Function            PT Goals (current goals can now be found in the care plan section) Acute Rehab PT Goals Patient Stated Goal: Improved breathing and energy, return home PT Goal Formulation: With patient Time For Goal Achievement: 05/03/21 Potential to Achieve Goals: Good Progress towards PT goals: Progressing toward goals    Frequency    Min 3X/week  PT Plan Current plan remains appropriate    Co-evaluation              AM-PAC PT "6 Clicks" Mobility   Outcome Measure  Help needed turning from your back to your side while in a flat bed without using bedrails?: A Little Help needed moving from lying on your back to sitting on the side of a flat bed without using bedrails?: A Little Help needed moving to and from a bed to a chair (including a wheelchair)?: None Help needed standing up from a chair using your arms (e.g., wheelchair or bedside chair)?: None Help needed to walk in hospital room?: A Little Help needed climbing 3-5 steps with a  railing? : A Little 6 Click Score: 20    End of Session Equipment Utilized During Treatment: Oxygen Activity Tolerance: Patient tolerated treatment well Patient left: in bed;with call bell/phone within reach;with family/visitor present Nurse Communication: Mobility status PT Visit Diagnosis: Other abnormalities of gait and mobility (R26.89)     Time: 4712-5271 PT Time Calculation (min) (ACUTE ONLY): 28 min  Charges:  $Gait Training: 8-22 mins $Therapeutic Exercise: 8-22 mins                     04/24/2021  Ginger Carne., PT Acute Rehabilitation Services [phone]  (pager) [phone]  (office)   Tessie Fass Prashant Glosser 04/24/2021, 2:48 PM

## 2021-04-24 NOTE — Plan of Care (Signed)

## 2021-04-24 NOTE — Progress Notes (Addendum)
      Autumn Conley       Autumn Conley,Autumn Conley             [phone]       5 Days Post-Op Procedure(s) (LRB): attempted XI ROBOTIC ASSISTED THORASCOPY (Right) INTERCOSTAL NERVE BLOCK (Right) PERICARDIAL WINDOW WITH THORACOTOMY APPROACH (N/A)  Subjective: Patient without specific complaint this am  Objective: Vital signs in last 24 hours: Temp:  [98.1 F (36.7 C)-98.5 F (36.9 C)] 98.5 F (36.9 C) (07/06 0335) Pulse Rate:  [67-79] 67 (07/06 0335) Cardiac Rhythm: Normal sinus rhythm (07/06 0335) Resp:  [18-20] 18 (07/06 0335) BP: (146-158)/(66-87) 153/66 (07/06 0335) SpO2:  [96 %-99 %] 97 % (07/06 0335) Weight:  [70.3 kg] 70.3 kg (07/06 0400)     Intake/Output from previous day: 07/05 0701 - 07/06 0700 In: 853 [P.O.:840; I.V.:13] Out: 1835 [RZNBV:6701; Chest Tube:60]   Physical Exam:  Cardiovascular: RRR Pulmonary: Clear to auscultation bilaterally Abdomen: Soft, non tender, bowel sounds present. Extremities: No lower extremity edema. Wounds: Dressing is clean and dry.   Chest Tube: to water seal, no air leak  Lab Results: CBC: Recent Labs    04/23/21 0425  WBC 4.9  HGB 10.1*  HCT 31.2*  PLT 240    BMET:  Recent Labs    04/23/21 0425 04/24/21 0550  NA 137 137  K 3.8 4.2  CL 102 100  CO2 29 32  GLUCOSE 108* 116*  BUN 12 8  CREATININE 0.56 0.64  CALCIUM 8.9 9.3     PT/INR: No results for input(s): LABPROT, INR in the last 72 hours. ABG:  INR: Will add last result for INR, ABG once components are confirmed Will add last 4 CBG results once components are confirmed  Assessment/Plan:  1. CV - SR with HR in the 70's. Echo done yesterday and results showed LVEF 60-65%, moderate LVH, and significant improvement in pericardial effusion.  2.  Pulmonary - On 2 liters of oxygen via [place]. Wean as able. Chest tube with 60 cc last 24 hours 9by my mark greater than 110 cc last 24 hours).  Chest tube is to water seal;no air leak. CXR this am  appears stable. Check CXR in am. Pericardial tube to remain for now. Final pathology from pericardial biopsy:Benign pericardium. Encourage incentive spirometer.  3. Last H and H stable at 10.1 and 31.2   Donielle M ZimmermanPA-C 04/24/2021,7:07 AM [phone]    Agree with above Will remove CT today  Satomi Buda O Tyan Lasure

## 2021-04-24 NOTE — Discharge Instructions (Addendum)
ACTIVITY:  1.Increase activity slowly. 2.Walk daily and increase frequency and duration as tolerates. 3.May walk up steps. 4.No lifting more than ten pounds for two weeks. 5.No driving for two weeks. 6.Avoid straining. 7.STOP any activity that causes chest pain, shortness of breath, dizziness,sweating,     or excessive weakness. 8.Continue with breathing exercises daily.  DIET:  Heart healthy   WOUND:  1.May shower. 2.Clean wounds with mild soap and water.  Call the office at [phone] if any problems arise.

## 2021-04-24 NOTE — Progress Notes (Signed)
PROGRESS NOTE    Autumn Conley  BSJ:[id number] DOB: 17-Oct-1946 DOA: 04/18/2021 PCP: [name], PA    Brief Narrative:   75 year old female with history of metastatic thymoma status post chemo and XRT was diagnosed with a pericardial effusion in 4/22 followed by cardiology as outpatient repeat echo noted worsening size of pericardial effusion, due to worsening symptoms was admitted on 6/30  -Imaging noted bilateral pleural effusions, worse on the right CT chest enlarging right-sided pleural effusion, right lower lobe middle lobe atelectasis.  Large pericardial effusion. -Echocardiogram with worsening large pericardial effusion with no RA/RC collapse in diastole. LV EF 60 to 65%. -T CTS was consulted, underwent right thoracoscopy with drainage of right pleural effusion, left anterior minithoracotomy and pericardial window   Assessment & Plan:   Large pericardial effusion Bilateral pleural effusion right> left  -Underwent pericardial window and drainage of right pleural effusion on 7/2 by Dr. [name]  -Left chest tube was removed 7/5  -Repeat echo revealed trivial pericardial effusion  -Still with considerable output from right chest tube, chest x-ray is stable -Per T CTS -Pathology: Pericardial biopsy was benign -Pleural effusion: Cytology noted reactive mesothelial cells only -Increase activity, physical therapy, symptom control   AKI due to hypovolemic hypotension/ hyponatremia.  -Resolved with gentle hydration  Metastatic thymoma. Chronic anemia Sp radiation and chemotherapy. -Followed [facility] at [facility]   HTN/ dyslipidemia.  On statin/ Holding blood pressure medications due to hypotension.    Depression and anxiety. On  paroxetine and alprazolam.    Status is: Inpatient  Remains inpatient appropriate because:Inpatient level of care appropriate due to severity of illness  Dispo: The patient is from: Home              Anticipated d/c is to: Home               Patient currently is not medically stable to d/c.   Difficult to place patient No   DVT prophylaxis: Enoxaparin   Code Status:   full  Family Communication:  Discussed patient in detail, no family at bedside   Consultants:  Cardiology  CT surgery   Procedures: Dr. [name] 7/2 Robotic assisted video thoracoscopy, Drainage of right pleural effusion Left anterior mini thoracotomy Pericardial window     Subjective: -Feels okay overall, some pain with deep breaths  Objective: Vitals:   04/23/21 2308 04/24/21 0335 04/24/21 0400 04/24/21 0700  BP: (!) 149/76 (!) 153/66  140/79  Pulse: 75 67  70  Resp: 18 18  15   Temp: 98.1 F (36.7 C) 98.5 F (36.9 C)  98.1 F (36.7 C)  TempSrc: Oral Oral  Oral  SpO2: 96% 97%  99%  Weight:   70.3 kg   Height:        Intake/Output Summary (Last 24 hours) at 04/24/2021 1058 Last data filed at 04/24/2021 0758 Gross per 24 hour  Intake 800 ml  Output 1435 ml  Net -635 ml   Filed Weights   04/22/21 0650 04/23/21 0457 04/24/21 0400  Weight: 72.3 kg 72.8 kg 70.3 kg    Examination:   General: Chronically ill pleasant female sitting up in bed, AAOx3, nondistressed HEENT: No JVD CVS: S1-S2, regular rhythm Lungs: Decreased breath sounds at both bases  Right lower lung chest tube to waterseal  Abdomen: Soft, nontender, bowel sounds present Extremities: No edema   Data Reviewed: I have personally reviewed following labs and imaging studies  CBC: Recent Labs  Lab 04/18/21 1257 04/20/21 0040 04/21/21 0221 04/23/21  0425  WBC 5.6 9.8 8.6 4.9  NEUTROABS 4.5  --   --  3.5  HGB 10.7* 10.4* 9.4* 10.1*  HCT 32.6* 31.4* 28.3* 31.2*  MCV 95.9 94.9 95.6 97.2  PLT 235 238 250 567   Basic Metabolic Panel: Recent Labs  Lab 04/20/21 0040 04/21/21 0221 04/22/21 0038 04/23/21 0425 04/24/21 0550  NA 133* 132* 135 137 137  K 3.8 3.7 4.1 3.8 4.2  CL 97* 99 104 102 100  CO2 26 24 27 29  32  GLUCOSE 173* 102* 113* 108* 116*  BUN 14 25*  16 12 8   CREATININE 0.85 1.27* 0.78 0.56 0.64  CALCIUM 8.6* 8.3* 8.7* 8.9 9.3   GFR: Estimated Creatinine Clearance: 57.2 mL/min (by C-G formula based on SCr of 0.64 mg/dL). Liver Function Tests: Recent Labs  Lab 04/21/21 0221  AST 19  ALT 24  ALKPHOS 92  BILITOT 0.7  PROT 5.4*  ALBUMIN 2.8*   No results for input(s): LIPASE, AMYLASE in the last 168 hours. No results for input(s): AMMONIA in the last 168 hours. Coagulation Profile: No results for input(s): INR, PROTIME in the last 168 hours. Cardiac Enzymes: No results for input(s): CKTOTAL, CKMB, CKMBINDEX, TROPONINI in the last 168 hours. BNP (last 3 results) No results for input(s): PROBNP in the last 8760 hours. HbA1C: No results for input(s): HGBA1C in the last 72 hours. CBG: No results for input(s): GLUCAP in the last 168 hours. Lipid Profile: No results for input(s): CHOL, HDL, LDLCALC, TRIG, CHOLHDL, LDLDIRECT in the last 72 hours. Thyroid Function Tests: No results for input(s): TSH, T4TOTAL, FREET4, T3FREE, THYROIDAB in the last 72 hours. Anemia Panel: No results for input(s): VITAMINB12, FOLATE, FERRITIN, TIBC, IRON, RETICCTPCT in the last 72 hours.    Radiology Studies: I have reviewed all of the imaging during this hospital visit personally     Scheduled Meds:  acetaminophen  650 mg Oral QID   vitamin C  1,000 mg Oral Daily   atorvastatin  40 mg Oral QHS   Chlorhexidine Gluconate Cloth  6 each Topical Daily   cholecalciferol  5,000 Units Oral Daily   cyanocobalamin  1,000 mcg Intramuscular Q30 days   enoxaparin (LOVENOX) injection  40 mg Subcutaneous Q24H   guaiFENesin-dextromethorphan  5 mL Oral TID   pantoprazole  40 mg Oral QAC breakfast   PARoxetine  60 mg Oral Daily   polyethylene glycol  17 g Oral BID   senna-docusate  2 tablet Oral BID   sodium chloride flush  10-40 mL Intracatheter Q12H   sodium chloride flush  3 mL Intravenous Q12H   valACYclovir  1,000 mg Oral BID   vitamin E  400  Units Oral Daily   Continuous Infusions:  sodium chloride       LOS: 6 days    Domenic Polite, MD

## 2021-04-25 ENCOUNTER — Inpatient Hospital Stay (HOSPITAL_COMMUNITY): Payer: Medicare Other

## 2021-04-25 DIAGNOSIS — Z20822 Contact with and (suspected) exposure to covid-19: Secondary | ICD-10-CM | POA: Diagnosis not present

## 2021-04-25 LAB — AEROBIC/ANAEROBIC CULTURE W GRAM STAIN (SURGICAL/DEEP WOUND): Culture: NO GROWTH

## 2021-04-25 LAB — BASIC METABOLIC PANEL
Anion gap: 7 (ref 5–15)
BUN: 11 mg/dL (ref 8–23)
CO2: 31 mmol/L (ref 22–32)
Calcium: 9.2 mg/dL (ref 8.9–10.3)
Chloride: 98 mmol/L (ref 98–111)
Creatinine, Ser: 0.65 mg/dL (ref 0.44–1.00)
GFR, Estimated: >60 mL/min (ref >60)
Glucose, Bld: 103 mg/dL — ABNORMAL HIGH (ref 70–99)
Potassium: 4.3 mmol/L (ref 3.5–5.1)
Sodium: 136 mmol/L (ref 135–145)

## 2021-04-25 LAB — CBC
HCT: 34.1 % — ABNORMAL LOW (ref 36.0–46.0)
Hemoglobin: 11.5 g/dL — ABNORMAL LOW (ref 12.0–15.0)
MCH: 31.7 pg (ref 26.0–34.0)
MCHC: 33.7 g/dL (ref 30.0–36.0)
MCV: 93.9 fL (ref 80.0–100.0)
Platelets: 290 10*3/uL (ref 150–400)
RBC: 3.63 MIL/uL — ABNORMAL LOW (ref 3.87–5.11)
RDW: 13.7 % (ref 11.5–15.5)
WBC: 5.5 10*3/uL (ref 4.0–10.5)
nRBC: 0 % (ref 0.0–0.2)

## 2021-04-25 MED ORDER — OXYCODONE HCL 5 MG PO TABS: 5.0000 mg | ORAL_TABLET | Freq: Four times a day (QID) | ORAL | 0 refills | Status: DC | PRN

## 2021-04-25 NOTE — Progress Notes (Signed)
Occupational Therapy Progress Note (late entry)  Pt demonstrates significant improvement in activity tolerance and ability to perform ADLs.  She currently requires set up to Supervision with ADLs and functional mobility with Sp02 85-92% on RA. Recommend she sit to shower at discharge to conserve energy and prevent falls.  Spouse is very supportive.     04/24/21 1800  OT Visit Information  Last OT Received On 04/24/21  Assistance Needed +1  History of Present Illness Pt is a 75 y.o. female admitted 04/18/21 with SOB. CXR showed cardiomegaly and moderate amount of pleural effusion (R>L). Workup for acute on chronic CHF decompensation secondary to increasing pericardial effusion and uncontrolled HTN. S/p  pericardial window and chest tube placement 7/1. 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  Precautions  Precautions Other (comment)  Precaution Comments Watch SpO2 (does not wear O2 baseline);  Pain Assessment  Pain Assessment Faces  Faces Pain Scale 2  Pain Location site of rt chest tube which was pulled earlier  Pain Descriptors / Indicators Grimacing  Pain Intervention(s) Monitored during session  Cognition  Arousal/Alertness Awake/alert  Behavior During Therapy WFL for tasks assessed/performed  Overall Cognitive Status Within Functional Limits for tasks assessed  Upper Extremity Assessment  Upper Extremity Assessment Overall WFL for tasks assessed  Lower Extremity Assessment  Lower Extremity Assessment Defer to PT evaluation  ADL  Overall ADL's  Needs assistance/impaired  Eating/Feeding Independent;Sitting  Grooming Wash/dry hands;Wash/dry face;Oral care;Brushing hair;Supervision/safety;Standing  Upper Body Bathing Set up;Sitting  Lower Body Bathing Supervison/ safety;Sit to/from stand  Upper Body Dressing  Set up;Sitting  Lower Body Dressing Supervision/safety;Sit  to/from Arboriculturist;Ambulation;Comfort height toilet;Grab bars  Toileting- Clothing Manipulation and Hygiene Supervision/safety;Sit to/from stand  Tub/Shower Transfer Details (indicate cue type and reason) pt reports she has a shower seat at home. Recommend that she sit to shower initially and she agreed  Functional mobility during ADLs Supervision/safety  Bed Mobility  Overal bed mobility Needs Assistance  Bed Mobility Supine to Sit  Supine to sit Supervision  Balance  Overall balance assessment Mild deficits observed, not formally tested  General Comments  General comments (skin integrity, edema, etc.) Sp02 85-92% with activity on RA  OT - End of Session  Equipment Utilized During Treatment Gait belt  Activity Tolerance Patient tolerated treatment well  Patient left in chair;with call bell/phone within reach;with family/visitor present  Nurse Communication Mobility status  OT Assessment/Plan  OT Plan Discharge plan remains appropriate  OT Visit Diagnosis Muscle weakness (generalized) (M62.81);Pain  OT Frequency (ACUTE ONLY) Min 2X/week  Follow Up Recommendations No OT follow up  OT Equipment None recommended by OT  AM-PAC OT "6 Clicks" Daily Activity Outcome Measure (Version 2)  Help from another person eating meals? 4  Help from another person taking care of personal grooming? 3  Help from another person toileting, which includes using toliet, bedpan, or urinal? 3  Help from another person bathing (including washing, rinsing, drying)? 3  Help from another person to put on and taking off regular upper body clothing? 3  Help from another person to put on and taking off regular lower body clothing? 3  6 Click Score 19  OT Goal Progression  Progress towards OT goals Progressing toward goals  OT Time Calculation  OT Start Time (ACUTE ONLY) 1431  OT Stop Time (ACUTE ONLY) 1501  OT Time Calculation (min) 30 min  OT General Charges  $  OT Visit 1 Visit   OT Treatments  $Self Care/Home Management  8-22 mins  $Therapeutic Activity 8-22 mins  Nilsa Nutting., OTR/L Acute Rehabilitation Services Pager [phone] Office [phone]

## 2021-04-25 NOTE — Progress Notes (Addendum)
      [address]       [employer] [id number]             [phone]       6 Days Post-Op Procedure(s) (LRB): attempted XI ROBOTIC ASSISTED THORASCOPY (Right) INTERCOSTAL NERVE BLOCK (Right) PERICARDIAL WINDOW WITH THORACOTOMY APPROACH (N/A)  Subjective: Patient states still some drainage from posterior chest tube wound  Objective: Vital signs in last 24 hours: Temp:  [98 F (36.7 C)-98.3 F (36.8 C)] 98.3 F (36.8 C) (07/07 0614) Pulse Rate:  [78-85] 85 (07/07 0614) Cardiac Rhythm: Normal sinus rhythm (07/07 0614) Resp:  [16-20] 18 (07/07 0614) BP: (129-182)/(77-99) 129/85 (07/07 0614) SpO2:  [91 %-96 %] 91 % (07/07 0614) Weight:  [69.4 kg] 69.4 kg (07/07 0614)     Intake/Output from previous day: 07/06 0701 - 07/07 0700 In: 200 [P.O.:200] Out: 1160 [Urine:1050; Chest Tube:110]   Physical Exam:  Cardiovascular: RRR Pulmonary: Clear to auscultation bilaterally Abdomen: Soft, non tender, bowel sounds present. Extremities: No lower extremity edema. Wounds: All wounds are clean, dry, and healing without sign of infection. Posterior chest tube wound has serous drainage this am.   Lab Results: CBC: Recent Labs    04/23/21 0425 04/25/21 0500  WBC 4.9 5.5  HGB 10.1* 11.5*  HCT 31.2* 34.1*  PLT 240 290    BMET:  Recent Labs    04/24/21 0550 04/25/21 0500  NA 137 136  K 4.2 4.3  CL 100 98  CO2 32 31  GLUCOSE 116* 103*  BUN 8 11  CREATININE 0.64 0.65  CALCIUM 9.3 9.2     PT/INR: No results for input(s): LABPROT, INR in the last 72 hours. ABG:  INR: Will add last result for INR, ABG once components are confirmed Will add last 4 CBG results once components are confirmed  Assessment/Plan:  1. CV - SR with HR in the 70's.  2.  Pulmonary - On room air. CXR this am appears stable. Encourage incentive spirometer.  3. Last H and H stable at 10.1 and 31.2 4. Follow up appointment has been arranged; I have sent pain prescription to  patient's listed pharmacy. Patient instructed to apply dry gauze and tape to posterior chest tube wound. Change daily and PRN. Should stop draining in a few days  Donielle M ZimmermanPA-C 04/25/2021,7:01 AM [phone]  Agree with above CXR stable  F/u in 1 weeks following discharge  [place]

## 2021-04-25 NOTE — Discharge Summary (Signed)
Physician Discharge Summary  Autumn Conley OKH:[id number] DOB: [birth year] DOA: 04/18/2021  PCP: Autumn Coke, PA  Admit date: 04/18/2021 Discharge date: 04/25/2021  Time spent: 35 minutes  Recommendations for Outpatient Follow-up:  Cardiology Dr.Acharya in 2weeks T CTS Dr. [name] in 1 to 2 weeks with x-ray   Discharge Diagnoses:  Large pericardial effusion Large right pleural effusion Metastatic thymoma   Acute on chronic diastolic CHF (congestive heart failure) (HCC)   CHF (congestive heart failure) (HCC)   Pericardial effusion   Pleural effusion   S/P lung surgery, follow-up exam   Discharge Condition: Stable  Diet recommendation: Low-sodium, heart healthy  Filed Weights   04/23/21 0457 04/24/21 0400 04/25/21 0614  Weight: 72.8 kg 70.3 kg 69.4 kg    History of present illness:  75 year old female with history of metastatic thymoma status post chemo and XRT was diagnosed with a pericardial effusion in 4/22 followed by cardiology as outpatient repeat echo noted worsening size of pericardial effusion, due to worsening symptoms was admitted on 6/30 -Imaging noted bilateral pleural effusions, worse on the right CT chest enlarging right-sided pleural effusion, right lower lobe middle lobe atelectasis.  Large pericardial effusion. -Echocardiogram with worsening large pericardial effusion with no RA/RC collapse in diastole. LV EF 60 to 65%. -T CTS was consulted, underwent right thoracoscopy with drainage of right pleural effusion, left anterior minithoracotomy and pericardial window  Hospital Course:   Large pericardial effusion Bilateral pleural effusion right> left  -Prior to admission was followed by cardiology for pericardial effusion which was presumed to be postviral, admitted with worsening size and symptoms  -underwent pericardial window and drainage of right pleural effusion on 7/2 by Dr. [name]  -Followed closely by T CTS, left chest tube was removed 7/5 and  right chest tube was removed 7/6 -Repeat echo revealed trivial pericardial effusion  -Pathology: Pericardial biopsy was benign -Pleural effusion: Cytology noted reactive mesothelial cells only -Improved and stable clinically, follow-up arranged with T CTS, patient is also advised to follow-up with her cardiologist Dr. Margaretann Conley in 1 to 2 weeks   AKI due to hypovolemic hypotension/ hyponatremia.  -Resolved with gentle hydration   Metastatic thymoma. Chronic anemia Sp radiation and chemotherapy. -Followed by oncology Dr. Janean Conley at [facility]   HTN/ dyslipidemia.  -stable    Depression and anxiety.  -On  paroxetine and alprazolam.      Discharge Exam: Vitals:   04/25/21 0614 04/25/21 0722  BP: 129/85 136/73  Pulse: 85 83  Resp: 18 14  Temp: 98.3 F (36.8 C) 98.5 F (36.9 C)  SpO2: 91% 95%    General: AAOx3 Cardiovascular: S1-S2, regular rate rhythm Respiratory: Decreased breath sounds at the left base  Discharge Instructions   Discharge Instructions     Diet - low sodium heart healthy   Complete by: As directed    Discharge wound care:   Complete by: As directed    routine   Increase activity slowly   Complete by: As directed       Allergies as of 04/25/2021       Reactions   Latex Rash, Other (See Comments)   Blisters, also        Medication List     STOP taking these medications    estradiol 0.1 MG/GM vaginal cream Commonly known as: ESTRACE   furosemide 20 MG tablet Commonly known as: LASIX       TAKE these medications    ALPRAZolam 0.25 MG tablet Commonly known as: XANAX Take 0.5 tablets (  0.125 mg total) by mouth daily as needed for anxiety.   atorvastatin 40 MG tablet Commonly known as: LIPITOR TAKE 1 TABLET BY MOUTH AT  BEDTIME   AZO-CRANBERRY PO Take 1 tablet by mouth 2 (two) times daily.   butalbital-acetaminophen-caffeine 50-325-40 MG tablet Commonly known as: FIORICET TAKE 1 TO 2 TABLETS BY  MOUTH EVERY 6 HOURS AS   NEEDED FOR HEADACHE(S) .  MANUFACTURER RECOMMENDS NOT EXCEEDING 6 TABLETS/DAY What changed:  how much to take how to take this when to take this reasons to take this additional instructions   cyanocobalamin 1000 MCG/ML injection Commonly known as: (VITAMIN B-12) Inject 1 mL (1,000 mcg total) into the muscle every 30 (thirty) days.   docusate sodium 100 MG capsule Commonly known as: COLACE Take 100 mg by mouth 2 (two) times daily as needed for mild constipation or moderate constipation.   irbesartan 150 MG tablet Commonly known as: AVAPRO TAKE 1 TABLET BY MOUTH  DAILY What changed: when to take this   ondansetron 8 MG tablet Commonly known as: ZOFRAN Take 8 mg by mouth every 8 (eight) hours as needed for nausea or vomiting.   oxyCODONE 5 MG immediate release tablet Commonly known as: Oxy IR/ROXICODONE Take 1 tablet (5 mg total) by mouth every 6 (six) hours as needed for moderate pain.   pantoprazole 40 MG tablet Commonly known as: PROTONIX Take 1 tablet (40 mg total) by mouth daily before breakfast.   PARoxetine 20 MG tablet Commonly known as: PAXIL TAKE 3 TABLETS BY MOUTH IN  THE MORNING What changed: See the new instructions.   polyethylene glycol powder 17 GM/SCOOP powder Commonly known as: GLYCOLAX/MIRALAX Take 17 g by mouth daily as needed for mild constipation.   SUMAtriptan 100 MG tablet Commonly known as: IMITREX Take 1 tablet (100 mg total) by mouth 2 (two) times daily as needed. At least 2 hrs between doses as needed bid What changed:  reasons to take this additional instructions   valACYclovir 1000 MG tablet Commonly known as: VALTREX Take two tablets daily x 5 days. Then for future outbreaks, take 2 tablets by mouth every 12 hours for 1 day total What changed:  how much to take how to take this when to take this additional instructions   vitamin C 1000 MG tablet Take 1,000 mg by mouth daily.   VITAMIN D-3 PO Take 1 capsule by mouth daily.    vitamin E 180 MG (400 UNITS) capsule Take 400 Units by mouth daily.               Discharge Care Instructions  (From admission, onward)           Start     Ordered   04/25/21 0000  Discharge wound care:       Comments: routine   04/25/21 1204           Allergies  Allergen Reactions   Latex Rash and Other (See Comments)    Blisters, also    Follow-up Information     Lajuana Matte, MD. Go on 05/03/2021.   Specialty: Cardiothoracic Surgery Why: Appointment time is at 12:30 pm Contact information: [place] [address] [phone]         [name], MD. Schedule an appointment as soon as possible for a visit in 2 week(s).   Specialties: Cardiology, Radiology Contact information: [address] [place] [address] [phone]  The results of significant diagnostics from this hospitalization (including imaging, microbiology, ancillary and laboratory) are listed below for reference.    Significant Diagnostic Studies: DG Chest 2 View  Result Date: 04/23/2021 CLINICAL DATA:  Follow-up pleural effusion. EXAM: CHEST - 2 VIEW COMPARISON:  04/22/2021. FINDINGS: Right PICC line and bilateral chest tubes in stable position. No pneumothorax. Heart size stable. Persistent bibasilar atelectasis/infiltrates, particularly on the right. No interim change. Persistent small bilateral pleural effusions. Surgical clips left chest. IMPRESSION: 1. Right PICC line and bilateral chest tubes in stable position. No pneumothorax. 2. Low lung volumes with persistent bibasilar atelectasis/infiltrates, particularly on the right. No interim change. Persistent small bilateral pleural effusions. Electronically Signed   By: Marcello Moores  Register   On: 04/23/2021 06:30   DG Chest 2 View  Result Date: 04/18/2021 CLINICAL DATA:  Shortness of breath over the last several days. EXAM: CHEST - 2 VIEW COMPARISON:   05/01/2020 FINDINGS: The cardiac silhouette is enlarged which could be due to cardiomegaly and/or pericardial fluid. Chronic aortic atherosclerotic calcification. Bilateral pleural effusions, larger on the right than the left, with dependent atelectasis in the lower lungs. Lung apices are clear. IMPRESSION: Enlarged cardiac silhouette. Bilateral effusions, right larger than left, with volume loss in the lower lobes, right worse than left. Findings most consistent with congestive heart failure. Coexistent pneumonia not excluded. Electronically Signed   By: Nelson Chimes M.D.   On: 04/18/2021 12:36   CT CHEST WO CONTRAST  Result Date: 04/18/2021 CLINICAL DATA:  Evaluate effusions, shortness of breath EXAM: CT CHEST WITHOUT CONTRAST TECHNIQUE: Multidetector CT imaging of the chest was performed following the standard protocol without IV contrast. COMPARISON:  05/02/2020, chest x-ray from earlier in the same day, PET-CT from 03/06/2021 FINDINGS: Cardiovascular: Limited due to the lack of IV contrast. Atherosclerotic calcifications of the aorta are noted. Pericardial effusion is seen measuring up to 3 cm thickness. No cardiac enlargement is noted. Coronary calcifications are noted. No enlargement of the pulmonary artery is seen. Mediastinum/Nodes: Thoracic inlet is within normal limits. Small hiatal hernia is noted. The esophagus is otherwise within normal limits. Scattered small likely reactive lymph nodes are noted within the mediastinum. Lungs/Pleura: Small left pleural effusion is noted. No focal infiltrate is seen. Large right-sided pleural effusion is noted with lower lobe consolidation and some upper lobe consolidation as well. The effusion has increased when compared with the prior PET-CT. Persistent right upper lobe lesion is noted laterally. Upper Abdomen: Visualized upper abdomen shows no acute abnormality. Musculoskeletal: Degenerative changes of the thoracic spine are noted. Fractures of the right fifth  and sixth ribs are noted laterally stable from prior exams. IMPRESSION: Enlarging right-sided pleural effusion. The degree of consolidation seen on the right has progressed slightly particularly in the lower lobe related to the enlarging effusion. Large pericardial effusion. Stable right upper lobe nodule similar to that seen on prior PET-CT. Small hiatal hernia. Aortic Atherosclerosis (ICD10-I70.0). Electronically Signed   By: Inez Catalina M.D.   On: 04/18/2021 16:38   DG CHEST PORT 1 VIEW  Result Date: 04/25/2021 CLINICAL DATA:  Chest tube removal. EXAM: PORTABLE CHEST 1 VIEW COMPARISON:  04/24/2021. FINDINGS: Interim removal of right chest tube. No pneumothorax noted. Right PICC line in stable position. Stable cardiomegaly. Low lung volumes with persistent bibasilar right mid lung atelectasis/infiltrates. Small left pleural effusion again noted. Surgical clips in the chest. Left mastectomy. IMPRESSION: 1. Interim removal of right chest tube. No pneumothorax noted. Right PICC line stable position. 2.  Stable cardiomegaly.  3. Low lung volumes with persistent bibasilar and right mid lung atelectasis/infiltrates. Small left pleural effusion again noted. Electronically Signed   By: Marcello Moores  Register   On: 04/25/2021 08:58   DG CHEST PORT 1 VIEW  Result Date: 04/24/2021 CLINICAL DATA:  Chest tube.  Postop. EXAM: PORTABLE CHEST 1 VIEW COMPARISON:  04/23/2021. FINDINGS: Interim removal of left chest tube. Right chest tube and right PICC line stable position. No pneumothorax. Stable cardiomegaly. Low lung volumes with persistent bibasilar and right mid lung atelectasis/infiltrates. Persistent small left pleural effusion. No right pleural effusion noted on today's exam. No pneumothorax. IMPRESSION: 1. Interim removal of left chest tube. Right chest tube and right PICC line stable position. No pneumothorax. 2. Low lung volumes with persistent bibasilar and right mid lung atelectasis/infiltrates. Persistent small left  pleural effusion. No right pleural effusion noted on today's exam. Electronically Signed   By: Marcello Moores  Register   On: 04/24/2021 07:52   DG CHEST PORT 1 VIEW  Result Date: 04/22/2021 CLINICAL DATA:  PICC line placement. EXAM: PORTABLE CHEST 1 VIEW COMPARISON:  04/21/2021 FINDINGS: Placement of a right arm PICC line. Catheter tip is in the region of the superior cavoatrial junction. Stable position of the right chest tube without a pneumothorax. Persistent densities in the right hilar region which have minimally changed. Again noted is a left-sided chest tube. Persistent densities at the left lung base. Cardiac silhouette is stable. IMPRESSION: 1. PICC line tip at the superior cavoatrial junction. 2. Stable appearance of the chest tubes without pneumothorax. 3. No significant change in the pleural and parenchymal lung densities. Electronically Signed   By: Markus Daft M.D.   On: 04/22/2021 15:27   DG CHEST PORT 1 VIEW  Result Date: 04/21/2021 CLINICAL DATA:  Status post lung surgery. EXAM: PORTABLE CHEST 1 VIEW COMPARISON:  April 20, 2021. FINDINGS: Stable cardiomediastinal silhouette. Stable right-sided chest tube without pneumothorax. Stable bilateral lung opacities are noted concerning for pneumonia or atelectasis. Stable left-sided chest tube. Stable small left pleural effusion. Bony thorax is unremarkable. IMPRESSION: Stable bilateral chest tubes. No pneumothorax. Stable bilateral lung opacities and small left pleural effusion. Electronically Signed   By: Marijo Conception M.D.   On: 04/21/2021 08:17   DG CHEST PORT 1 VIEW  Result Date: 04/20/2021 CLINICAL DATA:  Status post right lung surgery. EXAM: PORTABLE CHEST 1 VIEW COMPARISON:  April 19, 2021. FINDINGS: Stable cardiomediastinal silhouette. Right-sided chest tube is noted without pneumothorax. Stable left basilar pleural drainage catheter. Stable right midlung atelectasis or infiltrate is noted. Stable bibasilar atelectasis. Bony thorax is unremarkable.  IMPRESSION: Stable bilateral chest tubes without definite pneumothorax. Stable bilateral lung opacities as described above. Electronically Signed   By: Marijo Conception M.D.   On: 04/20/2021 08:14   DG Chest Port 1 View  Result Date: 04/19/2021 CLINICAL DATA:  Status post lung surgery.  Follow-up exam. EXAM: PORTABLE CHEST 1 VIEW COMPARISON:  April 18, 2021 FINDINGS: A right-sided chest tube is seen with its distal tip overlying the right apex. An additional chest tube is seen overlying the left lung base. Moderate severity areas of atelectasis and/or infiltrate are seen within the mid right lung and left lung base. There is no evidence of a pleural effusion or pneumothorax. The cardiac silhouette is borderline in size. Radiopaque surgical clips are seen overlying the left hilum and lateral aspect of the mid left chest wall. No acute osseous abnormalities are identified. IMPRESSION: 1. Bilateral chest tube positioning, as described above, without evidence  of an associated pneumothorax. 2. Moderate severity mid right lung and left basilar atelectasis and/or infiltrate. Electronically Signed   By: Virgina Norfolk M.D.   On: 04/19/2021 19:51   ECHOCARDIOGRAM COMPLETE  Result Date: 04/18/2021    ECHOCARDIOGRAM REPORT   Patient Name:   Autumn Conley Date of Exam: 04/18/2021 Medical Rec #:  [id number]   Height:       63.0 in Accession #:    [id number]  Weight:       159.4 lb Date of Birth:  June 23, 1946   BSA:          1.756 m Patient Age:    32 years    BP:           164/90 mmHg Patient Gender: F           HR:           75 bpm. Exam Location:  Inpatient Procedure: 2D Echo, Cardiac Doppler and Color Doppler REPORT CONTAINS CRITICAL RESULT Indications:     I31.3 Pericardial effusion (noninflammatory)  History:         Patient has prior history of Echocardiogram examinations, most                  recent 03/20/2021. Signs/Symptoms:Shortness of Breath and                  Dyspnea; Risk Factors:Hypertension and Dyslipidemia.  Breast                  cancer.  Sonographer:     Roseanna Rainbow RDCS Referring Phys:  [id number] Margie Billet Diagnosing Phys: Eleonore Chiquito MD IMPRESSIONS  1. There is a large circumferential pericardial effusion that measures up to 2.7 cm posterior to the LV. There is no RA/RV collapse in diastole. There is ~30% variation in MV inflow. The IVC is dilated with <50% collapse, but there is diastolic foward flow in the hepatic veins. When compared with the prior study, the effusion is larger and the IVC is now dilated. These changes suggest the effusion is becoming significant. Would recommend clinical correlation for tamponade. Large pericardial effusion. The pericardial effusion is circumferential.  2. Left ventricular ejection fraction, by estimation, is 60 to 65%. The left ventricle has normal function. The left ventricle has no regional wall motion abnormalities. There is moderate concentric left ventricular hypertrophy. Left ventricular diastolic parameters are consistent with Grade I diastolic dysfunction (impaired relaxation).  3. Right ventricular systolic function is normal. The right ventricular size is normal. Tricuspid regurgitation signal is inadequate for assessing PA pressure.  4. The mitral valve is degenerative. No evidence of mitral valve regurgitation. No evidence of mitral stenosis. Moderate to severe mitral annular calcification.  5. The aortic valve is tricuspid. There is moderate calcification of the aortic valve. There is moderate thickening of the aortic valve. Aortic valve regurgitation is mild. Mild aortic valve stenosis. Aortic valve area, by VTI measures 1.71 cm. Aortic valve mean gradient measures 10.0 mmHg. Aortic valve Vmax measures 2.06 m/s.  6. The inferior vena cava is dilated in size with >50% respiratory variability, suggesting right atrial pressure of 8 mmHg. Comparison(s): Changes from prior study are noted. Effusion is larger on this study. IVC now dilated. FINDINGS  Left Ventricle:  Left ventricular ejection fraction, by estimation, is 60 to 65%. The left ventricle has normal function. The left ventricle has no regional wall motion abnormalities. The left ventricular internal cavity size was normal in size. There is  moderate  concentric left ventricular hypertrophy. Left ventricular diastolic parameters are consistent with Grade I diastolic dysfunction (impaired relaxation). Right Ventricle: The right ventricular size is normal. No increase in right ventricular wall thickness. Right ventricular systolic function is normal. Tricuspid regurgitation signal is inadequate for assessing PA pressure. Left Atrium: Left atrial size was normal in size. Right Atrium: Right atrial size was normal in size. Pericardium: There is a large circumferential pericardial effusion that measures up to 2.7 cm posterior to the LV. There is no RA/RV collapse in diastole. There is ~30% variation in MV inflow. The IVC is dilated with <50% collapse, but there is diastolic  foward flow in the hepatic veins. When compared with the prior study, the effusion is larger and the IVC is now dilated. These changes suggest the effusion is becoming significant. Would recommend clinical correlation for tamponade. A large pericardial effusion is present. The pericardial effusion is circumferential. Presence of pericardial fat pad. Mitral Valve: The mitral valve is degenerative in appearance. Moderate to severe mitral annular calcification. No evidence of mitral valve regurgitation. No evidence of mitral valve stenosis. Tricuspid Valve: The tricuspid valve is grossly normal. Tricuspid valve regurgitation is trivial. No evidence of tricuspid stenosis. Aortic Valve: The aortic valve is tricuspid. There is moderate calcification of the aortic valve. There is moderate thickening of the aortic valve. Aortic valve regurgitation is mild. Aortic regurgitation PHT measures 472 msec. Mild aortic stenosis is present. Aortic valve mean gradient  measures 10.0 mmHg. Aortic valve peak gradient measures 17.0 mmHg. Aortic valve area, by VTI measures 1.71 cm. Pulmonic Valve: The pulmonic valve was grossly normal. Pulmonic valve regurgitation is trivial. No evidence of pulmonic stenosis. Aorta: The aortic root and ascending aorta are structurally normal, with no evidence of dilitation. Venous: The inferior vena cava is dilated in size with greater than 50% respiratory variability, suggesting right atrial pressure of 8 mmHg. IAS/Shunts: The atrial septum is grossly normal.  LEFT VENTRICLE PLAX 2D LVIDd:         2.90 cm     Diastology LVIDs:         2.10 cm     LV e' medial:    10.40 cm/s LV PW:         1.60 cm     LV E/e' medial:  10.9 LV IVS:        1.30 cm     LV e' lateral:   9.57 cm/s LVOT diam:     1.80 cm     LV E/e' lateral: 11.8 LV SV:         72 LV SV Index:   41 LVOT Area:     2.54 cm  LV Volumes (MOD) LV vol d, MOD A2C: 49.5 ml LV vol d, MOD A4C: 37.5 ml LV vol s, MOD A2C: 18.3 ml LV vol s, MOD A4C: 11.8 ml LV SV MOD A2C:     31.2 ml LV SV MOD A4C:     37.5 ml LV SV MOD BP:      28.1 ml RIGHT VENTRICLE             IVC RV S prime:     14.30 cm/s  IVC diam: 2.30 cm TAPSE (M-mode): 1.3 cm LEFT ATRIUM             Index       RIGHT ATRIUM           Index LA diam:        2.80 cm 1.59  cm/m  RA Area:     13.20 cm LA Vol (A2C):   31.7 ml 18.05 ml/m RA Volume:   33.40 ml  19.02 ml/m LA Vol (A4C):   28.2 ml 16.06 ml/m LA Biplane Vol: 29.7 ml 16.91 ml/m  AORTIC VALVE AV Area (Vmax):    1.66 cm AV Area (Vmean):   1.64 cm AV Area (VTI):     1.71 cm AV Vmax:           206.00 cm/s AV Vmean:          143.000 cm/s AV VTI:            0.420 m AV Peak Grad:      17.0 mmHg AV Mean Grad:      10.0 mmHg LVOT Vmax:         134.00 cm/s LVOT Vmean:        92.000 cm/s LVOT VTI:          0.283 m LVOT/AV VTI ratio: 0.67 AI PHT:            472 msec  AORTA Ao Root diam: 2.90 cm Ao Asc diam:  3.90 cm MITRAL VALVE MV Area (PHT): 3.53 cm     SHUNTS MV Decel Time: 215 msec      Systemic VTI:  0.28 m MV E velocity: 113.00 cm/s  Systemic Diam: 1.80 cm MV A velocity: 155.00 cm/s MV E/A ratio:  0.73 Eleonore Chiquito MD Electronically signed by Eleonore Chiquito MD Signature Date/Time: 04/18/2021/4:30:52 PM    Final (Updated)    ECHOCARDIOGRAM LIMITED  Result Date: 04/22/2021    ECHOCARDIOGRAM LIMITED REPORT   Patient Name:   MARELLY WEHRMAN Date of Exam: 04/22/2021 Medical Rec #:  [id number]   Height:       63.0 in Accession #:    [id number]  Weight:       159.4 lb Date of Birth:  14-Mar-1946   BSA:          1.756 m Patient Age:    9 years    BP:           115/94 mmHg Patient Gender: F           HR:           75 bpm. Exam Location:  Inpatient Procedure: 2D Echo and Color Doppler Indications:    Pericardial effusion  History:        Patient has prior history of Echocardiogram examinations, most                 recent 04/18/2021. Risk Factors:Hypertension and Dyslipidemia.                 Breast cancer. S/P pericardial window.  Sonographer:    Clayton Lefort RDCS (AE) Referring Phys: [id number] Nadean Corwin HILTY  Sonographer Comments: Bandages in apical and subcostal area, limiting imaging windows. IMPRESSIONS  1. Left ventricular ejection fraction, by estimation, is 60 to 65%. The left ventricle has normal function. The left ventricle has no regional wall motion abnormalities. There is moderate concentric left ventricular hypertrophy.  2. Right ventricular systolic function is normal. The right ventricular size is normal.  3. The mitral valve is abnormal. Moderate mitral annular calcification.  4. The aortic valve was not well visualized. Aortic valve regurgitation is mild.  5. The inferior vena cava is normal in size with <50% respiratory variability, suggesting right atrial pressure of 8 mmHg. Comparison(s): A prior study was performed on 03/22/21. Prior  images reviewed side by side. Compared to prior, significant improvement in pericardial effusion; decrease in IVC diameter. FINDINGS  Left Ventricle: Left ventricular  ejection fraction, by estimation, is 60 to 65%. The left ventricle has normal function. The left ventricle has no regional wall motion abnormalities. There is moderate concentric left ventricular hypertrophy. Right Ventricle: The right ventricular size is normal. Right ventricular systolic function is normal. Pericardium: Trivial pericardial effusion is present. Mitral Valve: The mitral valve is abnormal. Moderate mitral annular calcification. Tricuspid Valve: The tricuspid valve is not well visualized. Aortic Valve: The aortic valve was not well visualized. Aortic valve regurgitation is mild. Venous: The inferior vena cava is normal in size with less than 50% respiratory variability, suggesting right atrial pressure of 8 mmHg. LEFT VENTRICLE PLAX 2D LVIDd:         3.60 cm LVIDs:         2.50 cm LV PW:         1.30 cm LV IVS:        1.30 cm  IVC IVC diam: 1.20 cm LEFT ATRIUM         Index LA diam:    4.00 cm 2.28 cm/m Rudean Haskell MD Electronically signed by Rudean Haskell MD Signature Date/Time: 04/22/2021/5:06:32 PM    Final    [place] EKG SITE RITE  Result Date: 04/22/2021 If Site Rite image not attached, placement could not be confirmed due to current cardiac rhythm.   Microbiology: Recent Results (from the past 240 hour(s))  Resp Panel by RT-PCR (Flu A&B, Covid) Nasopharyngeal Swab     Status: None   Collection Time: 04/18/21  2:30 PM   Specimen: Nasopharyngeal Swab; Nasopharyngeal(NP) swabs in vial transport medium  Result Value Ref Range Status   SARS Coronavirus 2 by RT PCR NEGATIVE NEGATIVE Final    Comment: (NOTE) SARS-CoV-2 target nucleic acids are NOT DETECTED.  The SARS-CoV-2 RNA is generally detectable in upper respiratory specimens during the acute phase of infection. The lowest concentration of SARS-CoV-2 viral copies this assay can detect is 138 copies/mL. A negative result does not preclude SARS-Cov-2 infection and should not be used as the sole basis for treatment  or other patient management decisions. A negative result may occur with  improper specimen collection/handling, submission of specimen other than nasopharyngeal swab, presence of viral mutation(s) within the areas targeted by this assay, and inadequate number of viral copies(<138 copies/mL). A negative result must be combined with clinical observations, patient history, and epidemiological information. The expected result is Negative.  Fact Sheet for Patients:  EntrepreneurPulse.com.au  Fact Sheet for Healthcare Providers:  IncredibleEmployment.be  This test is no t yet approved or cleared by the [place] FDA and  has been authorized for detection and/or diagnosis of SARS-CoV-2 by FDA under an Emergency Use Authorization (EUA). This EUA will remain  in effect (meaning this test can be used) for the duration of the COVID-19 declaration under Section 564(b)(1) of the Act, 21 U.S.C.section 360bbb-3(b)(1), unless the authorization is terminated  or revoked sooner.       Influenza A by PCR NEGATIVE NEGATIVE Final   Influenza B by PCR NEGATIVE NEGATIVE Final    Comment: (NOTE) The Xpert Xpress SARS-CoV-2/FLU/RSV plus assay is intended as an aid in the diagnosis of influenza from Nasopharyngeal swab specimens and should not be used as a sole basis for treatment. Nasal washings and aspirates are unacceptable for Xpert Xpress SARS-CoV-2/FLU/RSV testing.  Fact Sheet for Patients: EntrepreneurPulse.com.au  Fact Sheet for Healthcare  Providers: IncredibleEmployment.be  This test is not yet approved or cleared by the [place] and has been authorized for detection and/or diagnosis of SARS-CoV-2 by FDA under an Emergency Use Authorization (EUA). This EUA will remain in effect (meaning this test can be used) for the duration of the COVID-19 declaration under Section 564(b)(1) of the Act, 21 U.S.C. section  360bbb-3(b)(1), unless the authorization is terminated or revoked.  Performed at [facility], [place] [address], [place], [place] [address]   Surgical pcr screen     Status: Abnormal   Collection Time: 04/19/21  3:30 AM   Specimen: Nasal Mucosa; Nasal Swab  Result Value Ref Range Status   MRSA, PCR POSITIVE (A) NEGATIVE Final    Comment: RESULT CALLED TO, READ BACK BY AND VERIFIED WITH: Z,JMA RN @0943  04/19/21 EB    Staphylococcus aureus POSITIVE (A) NEGATIVE Final    Comment: (NOTE) The Xpert SA Assay (FDA approved for NASAL specimens in patients 85 years of age and older), is one component of a comprehensive surveillance program. It is not intended to diagnose infection nor to guide or monitor treatment. Performed at [facility], [place] [address], [place], [place] [address]   Aerobic/Anaerobic Culture w Gram Stain (surgical/deep wound)     Status: None (Preliminary result)   Collection Time: 04/19/21  3:09 PM   Specimen: Other Source; Body Fluid  Result Value Ref Range Status   Specimen Description PLEURAL  Final   Special Requests SAMPLE A  Final   Gram Stain   Final    FEW WBC PRESENT, PREDOMINANTLY MONONUCLEAR NO ORGANISMS SEEN    Culture   Final    NO GROWTH 4 DAYS NO ANAEROBES ISOLATED; CULTURE IN PROGRESS FOR 5 DAYS Performed at [facility], [address], [place], [place] [address]    Report Status PENDING  Incomplete     Labs: Basic Metabolic Panel: Recent Labs  Lab 04/21/21 0221 04/22/21 0038 04/23/21 0425 04/24/21 0550 04/25/21 0500  NA 132* 135 137 137 136  K 3.7 4.1 3.8 4.2 4.3  CL 99 104 102 100 98  CO2 24 27 29  32 31  GLUCOSE 102* 113* 108* 116* 103*  BUN 25* 16 12 8 11   CREATININE 1.27* 0.78 0.56 0.64 0.65  CALCIUM 8.3* 8.7* 8.9 9.3 9.2   Liver Function Tests: Recent Labs  Lab 04/21/21 0221  AST 19  ALT 24  ALKPHOS 92  BILITOT 0.7  PROT 5.4*  ALBUMIN 2.8*   No results for input(s): LIPASE, AMYLASE in the last 168  hours. No results for input(s): AMMONIA in the last 168 hours. CBC: Recent Labs  Lab 04/20/21 0040 04/21/21 0221 04/23/21 0425 04/25/21 0500  WBC 9.8 8.6 4.9 5.5  NEUTROABS  --   --  3.5  --   HGB 10.4* 9.4* 10.1* 11.5*  HCT 31.4* 28.3* 31.2* 34.1*  MCV 94.9 95.6 97.2 93.9  PLT 238 250 240 290   Cardiac Enzymes: No results for input(s): CKTOTAL, CKMB, CKMBINDEX, TROPONINI in the last 168 hours. BNP: BNP (last 3 results) Recent Labs    04/18/21 1257  BNP 163.9*    ProBNP (last 3 results) No results for input(s): PROBNP in the last 8760 hours.  CBG: No results for input(s): GLUCAP in the last 168 hours.     Signed:  Domenic Polite MD.  [facility] 04/25/2021, 2:45 PM

## 2021-04-25 NOTE — Progress Notes (Signed)
Physical Therapy Treatment Patient Details Name: Autumn Conley MRN: [id number] DOB: 1946/08/09 Today's Date: 04/25/2021    History of Present Illness Pt is a 75 y.o. female admitted 04/18/21 with SOB. CXR showed cardiomegaly and moderate amount of pleural effusion (R>L). Workup for acute on chronic CHF decompensation secondary to increasing pericardial effusion and uncontrolled HTN. S/p  pericardial window and chest tube placement 7/1. 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    PT Comments    Pt received up ad lib in hallway with spouse, agreeable to therapy session and with good participation and tolerance for gait and stair negotiation. Pt performed gait progression and stairs at modI level and steady throughout with stable vital signs on room air. Emphasis on proper IS use and supine/standing HEP (handouts given to reinforce) and for safe activity progression. Anticipate pt safe to DC home once medically cleared, pt has met PT goals will DC acute PT.  Follow Up Recommendations  No PT follow up     Equipment Recommendations  None recommended by PT    Recommendations for Other Services       Precautions / Restrictions Precautions Precautions: Other (comment) Precaution Comments: Watch SpO2 (does not wear O2 baseline); Restrictions Weight Bearing Restrictions: No    Mobility  Bed Mobility        General bed mobility comments: pt received up ad lib and remained in chair at end    Transfers Overall transfer level: Modified independent Equipment used: None             General transfer comment: pt needs to push from chair armrests to rise safely, increased time but steady and safe/controlled when performing  Ambulation/Gait Ambulation/Gait assistance: Modified independent (Device/Increase time) Gait Distance (Feet): 400 Feet Assistive device: None Gait  Pattern/deviations: Step-through pattern   Gait velocity interpretation: 1.31 - 2.62 ft/sec, indicative of limited community ambulator General Gait Details: Steady gait, pt able to change gait speed appreciably with VSS throughout on room air   Stairs Stairs: Yes Stairs assistance: Modified independent (Device/Increase time) Stair Management: One rail Left;Step to pattern;Forwards Number of Stairs: 5 General stair comments: pt ascended/descended single 7" step x5 reps with L handrail from RW in room to simulate home setup, pt without LOB and steady throughout, VSS on RA          Balance Overall balance assessment: Mild deficits observed, not formally tested           Cognition Arousal/Alertness: Awake/alert Behavior During Therapy: WFL for tasks assessed/performed Overall Cognitive Status: Within Functional Limits for tasks assessed          General Comments: pleasant, cooperative      Exercises General Exercises - Lower Extremity Ankle Circles/Pumps: AROM;Both;10 reps;Seated Other Exercises Other Exercises: pt pulling 750-1,000 on IS x10 reps and given handout to reinforce technique/with chart to track progress Other Exercises: hallway gait trial and stairs for BLE strengthening    General Comments General comments (skin integrity, edema, etc.): SpO2 WNL 93-97% with exertion on RA; pt HR 87-99 bpm with exertion, no dizziness reported and BP stable per chart review      Pertinent Vitals/Pain Pain Assessment: 0-10 Pain Score: 0-No pain Pain Intervention(s): Monitored during session;Repositioned     PT Goals (current goals can now be found in the care plan section) Acute Rehab PT Goals Patient Stated Goal: to go home PT Goal Formulation: With patient Time For Goal  Achievement: 05/03/21 Progress towards PT goals: Progressing toward goals    Frequency    Min 3X/week      PT Plan Current plan remains appropriate    Co-evaluation               AM-PAC PT "6 Clicks" Mobility   Outcome Measure  Help needed turning from your back to your side while in a flat bed without using bedrails?: None Help needed moving from lying on your back to sitting on the side of a flat bed without using bedrails?: None Help needed moving to and from a bed to a chair (including a wheelchair)?: None Help needed standing up from a chair using your arms (e.g., wheelchair or bedside chair)?: None Help needed to walk in hospital room?: None Help needed climbing 3-5 steps with a railing? : None 6 Click Score: 24    End of Session   Activity Tolerance: Patient tolerated treatment well Patient left: in chair;with call bell/phone within reach;with family/visitor present (sitting up in chair to eat lunch which arrived during session) Nurse Communication: Mobility status;Other (comment) (pt cleared acute PT) PT Visit Diagnosis: Other abnormalities of gait and mobility (R26.89)     Time: 7262-0355 PT Time Calculation (min) (ACUTE ONLY): 15 min  Charges:  $Therapeutic Exercise: 8-22 mins                     Carly P., PTA Acute Rehabilitation Services Pager: [phone] Office: [place] 04/25/2021, 1:53 PM

## 2021-04-25 NOTE — Progress Notes (Signed)
PICC line removed. Manual pressure held to achieve hemostasis, then pressure bandage applied. Patient given post-removal instructions. No complications.

## 2021-04-25 NOTE — TOC Transition Note (Signed)
Transition of Care [facility]) - CM/SW Discharge Note   Patient Details  Name: Autumn Conley MRN: [id number] Date of Birth: 1945/12/02  [facility] of [facility]) CM/SW Contact:  Zenon Mayo, RN Phone Number: 04/25/2021, 10:18 AM   Clinical Narrative:    NCM spoke with patient, she has a scale and bp cuff at home. She states she tries to do a low sodium diet but she is not really strict with it.  She has no issues with getting her medications or taking them.  She has transport home. She has no other needs.   Final next level of care: Home/Self Care Barriers to Discharge: No Barriers Identified   Patient Goals and CMS Choice Patient states their goals for this hospitalization and ongoing recovery are:: return home   Choice offered to / list presented to : NA  Discharge Placement                       Discharge Plan and Services                  DME Agency: NA       HH Arranged: NA          Social Determinants of Health (SDOH) Interventions     Readmission Risk Interventions No flowsheet data found.

## 2021-04-29 LAB — CYTOLOGY - NON PAP

## 2021-05-03 ENCOUNTER — Other Ambulatory Visit: Payer: Self-pay

## 2021-05-03 ENCOUNTER — Ambulatory Visit (INDEPENDENT_AMBULATORY_CARE_PROVIDER_SITE_OTHER): Payer: Self-pay | Admitting: Surgical

## 2021-05-03 VITALS — BP 125/74 | HR 82 | Resp 20 | Ht 63.0 in | Wt 148.0 lb

## 2021-05-03 DIAGNOSIS — J90 Pleural effusion, not elsewhere classified: Secondary | ICD-10-CM

## 2021-05-03 DIAGNOSIS — Z09 Encounter for follow-up examination after completed treatment for conditions other than malignant neoplasm: Secondary | ICD-10-CM

## 2021-05-03 NOTE — Progress Notes (Signed)
[address]       [place] [address]             [phone]           [address]       [place] [address]             [phone]      Auda Donnelly [place] Medical Record #[id number] Date of Birth: [birth year]  Referring: Donato Heinz* Primary Care: [name], [place] Primary Cardiologist: [name], MD   Chief Complaint:   POST OP FOLLOW UP  Patient:  Autumn Conley Pre-Op Dx: Right pleural effusion                         Pericardial effusion                         History of malignant thymoma status post chemo radiation.                         History of left breast cancer status postmastectomy Post-op Dx: Same Procedure: - Robotic assisted right video thoracoscopy -Drainage of right pleural effusion -Left anterior minithoracotomy - Pericardial window - Intercostal nerve block   Surgeon and Role:      * Lightfoot, Lucile Crater, MD - Primary    *E. Barrett, PA-C- assisting   Anesthesia  general EBL: 15 ml History of Present Illness:    The patient is a 75 year old female status post the above described procedure seen in the office on today's date and routine postsurgical follow-up.  Overall the patient describes feeling significantly better in terms of overall symptoms, especially shortness of breath following the procedure.  Pathology results are noted to be benign both tissue and cytology.  She has had no significant difficulties with her incisions.  Pain has been adequately controlled.  Overall she is pleased with her progress.      Past Medical History:  Diagnosis Date   Anemia    Anxiety    Breast cancer ([place])    Depression    Dyspnea    Fibromyalgia    "some; not chronic" (11/29/2014)   GERD (gastroesophageal reflux disease)    Glaucoma of both eyes    Headache    Hypertension    IBS (irritable bowel syndrome)    Mitral valve prolapse    NASH (nonalcoholic steatohepatitis)     Osteoarthritis    PONV (postoperative nausea and vomiting)    Stroke ([facility])    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([place])      Social History   Tobacco Use  Smoking Status Never  Smokeless Tobacco Never    Social History   Substance and Sexual Activity  Alcohol Use No     Allergies  Allergen Reactions   Latex Rash and Other (See Comments)    Blisters, also    Current Outpatient Medications  Medication Sig Dispense Refill   ALPRAZolam (XANAX) 0.25 MG tablet Take 0.5 tablets (0.125 mg total) by mouth daily as needed for anxiety. 30 tablet 0   Ascorbic Acid (VITAMIN C) 1000 MG tablet Take 1,000 mg by mouth daily.     atorvastatin (LIPITOR) 40 MG tablet TAKE 1 TABLET BY MOUTH AT  BEDTIME 90 tablet 3   AZO-CRANBERRY PO Take 1 tablet by mouth 2 (two) times daily.     butalbital-acetaminophen-caffeine (FIORICET)  50-325-40 MG tablet TAKE 1 TO 2 TABLETS BY  MOUTH EVERY 6 HOURS AS  NEEDED FOR HEADACHE(S) .  MANUFACTURER RECOMMENDS NOT EXCEEDING 6 TABLETS/DAY 20 tablet 1   Cholecalciferol (VITAMIN D-3 PO) Take 1 capsule by mouth daily.     cyanocobalamin (,VITAMIN B-12,) 1000 MCG/ML injection Inject 1 mL (1,000 mcg total) into the muscle every 30 (thirty) days. 3 mL 2   docusate sodium (COLACE) 100 MG capsule Take 100 mg by mouth 2 (two) times daily as needed for mild constipation or moderate constipation.     irbesartan (AVAPRO) 150 MG tablet TAKE 1 TABLET BY MOUTH  DAILY 90 tablet 3   ondansetron (ZOFRAN) 8 MG tablet Take 8 mg by mouth every 8 (eight) hours as needed for nausea or vomiting.     oxyCODONE (OXY IR/ROXICODONE) 5 MG immediate release tablet Take 1 tablet (5 mg total) by mouth every 6 (six) hours as needed for moderate pain. 28 tablet 0   pantoprazole (PROTONIX) 40 MG tablet Take 1 tablet (40 mg total) by mouth daily before breakfast. 30 tablet 0   PARoxetine (PAXIL) 20 MG tablet TAKE 3 TABLETS BY MOUTH IN  THE MORNING 270 tablet 3   polyethylene glycol powder (GLYCOLAX/MIRALAX) 17 GM/SCOOP  powder Take 17 g by mouth daily as needed for mild constipation.     SUMAtriptan (IMITREX) 100 MG tablet Take 1 tablet (100 mg total) by mouth 2 (two) times daily as needed. At least 2 hrs between doses as needed bid 30 tablet 1   valACYclovir (VALTREX) 1000 MG tablet Take two tablets daily x 5 days. Then for future outbreaks, take 2 tablets by mouth every 12 hours for 1 day total 60 tablet 0   vitamin E 180 MG (400 UNITS) capsule Take 400 Units by mouth daily.     No current facility-administered medications for this visit.       Physical Exam: BP 125/74   Pulse 82   Resp 20   Ht 5' 3"  (1.6 m)   Wt 148 lb (67.1 kg)   SpO2 98% Comment: RA  BMI 26.22 kg/m   General appearance: alert, cooperative, and no distress Heart: regular rate and rhythm and 3/6 systolic murmur Lungs: clear to auscultation bilaterally Abdomen: Benign exam Wound: Incisions healing well without infection.  Sutures removed without difficulty.   Diagnostic Studies & Laboratory data:     Recent Radiology Findings:   No results found.    Recent Lab Findings: Lab Results  Component Value Date   WBC 5.5 04/25/2021   HGB 11.5 (L) 04/25/2021   HCT 34.1 (L) 04/25/2021   PLT 290 04/25/2021   GLUCOSE 103 (H) 04/25/2021   CHOL 181 02/27/2020   TRIG 159.0 (H) 02/27/2020   HDL 38.60 (L) 02/27/2020   LDLCALC 111 (H) 02/27/2020   ALT 24 04/21/2021   AST 19 04/21/2021   NA 136 04/25/2021   K 4.3 04/25/2021   CL 98 04/25/2021   CREATININE 0.65 04/25/2021   BUN 11 04/25/2021   CO2 31 04/25/2021   TSH 2.120 12/27/2020   INR 1.0 02/10/2019   HGBA1C 6.1 (H) 11/30/2014      Assessment / Plan: The patient continues to progress nicely.  There are no current surgically related issues and clinically she shows significant improvement in symptomatology.  She does describe a somewhat chronic dry cough that I believe is multifactorial.  I advised her she can use over the counter cough suppressants as needed.  We will  see her again in 1 month with a chest x-ray.      Medication Changes: No orders of the defined types were placed in this encounter.     [name], PA-C  05/03/2021 12:54 PM

## 2021-05-03 NOTE — Patient Instructions (Signed)
Discussed activity progression

## 2021-05-06 ENCOUNTER — Other Ambulatory Visit: Payer: Self-pay

## 2021-05-06 ENCOUNTER — Ambulatory Visit (HOSPITAL_COMMUNITY): Payer: Medicare Other | Attending: Internal Medicine

## 2021-05-06 DIAGNOSIS — I313 Pericardial effusion (noninflammatory): Secondary | ICD-10-CM | POA: Insufficient documentation

## 2021-05-06 DIAGNOSIS — I3139 Other pericardial effusion (noninflammatory): Secondary | ICD-10-CM

## 2021-05-06 LAB — ECHOCARDIOGRAM LIMITED
Area-P 1/2: 2.02 cm2
S' Lateral: 2.1 cm

## 2021-05-07 DIAGNOSIS — H35033 Hypertensive retinopathy, bilateral: Secondary | ICD-10-CM | POA: Diagnosis not present

## 2021-05-07 DIAGNOSIS — H26491 Other secondary cataract, right eye: Secondary | ICD-10-CM | POA: Diagnosis not present

## 2021-05-07 DIAGNOSIS — H04123 Dry eye syndrome of bilateral lacrimal glands: Secondary | ICD-10-CM | POA: Diagnosis not present

## 2021-05-07 DIAGNOSIS — H40013 Open angle with borderline findings, low risk, bilateral: Secondary | ICD-10-CM | POA: Diagnosis not present

## 2021-05-07 DIAGNOSIS — H26493 Other secondary cataract, bilateral: Secondary | ICD-10-CM | POA: Diagnosis not present

## 2021-05-09 ENCOUNTER — Telehealth: Payer: Self-pay | Admitting: Internal Medicine

## 2021-05-09 NOTE — Telephone Encounter (Signed)
Returned call to patient, made patient aware of Dr. Delphina Cahill and PharmD's reccomendations.   Patient has follow up OV scheduled for 7/28 with Dr. [name].   Advised patient to call back to office with any issues, questions, or concerns. Patient verbalized understanding.

## 2021-05-09 NOTE — Telephone Encounter (Signed)
I don't see any meds in her profile that would cause cough.  I tend to think of allergies or reflux as most common causes for cough.  She has pantoprazole on her profile - might see if she is compliant with that.  If not she should take daily for 2-3 weeks to see if reflux is cause.  If not, try Zyrtec or claritin daily for 2-3 weeks.  In the meantime, I usually recommend generic Delsym cough syrup.  It's a 12 hour formulation of DM that usually works well.

## 2021-05-09 NOTE — Telephone Encounter (Signed)
Pt is retunring a call about results

## 2021-05-09 NOTE — Telephone Encounter (Signed)
Returned call to patient, made patient aware of the following regarding her recent Echo results:  Autumn Munroe, MD  05/08/2021  5:40 PM EDT      Trivial residual pericardial effusion after pericardial window. Reassuring echoresults.    Patient also states that she wanted to ask Dr. [name] if she has any recommendations regarding her cough that she has had for a while now and even before her surgery. Patient states she would like to know if the cough is related to her lungs/pericardial effusion or not. Patient also states that she Is taking otc cough syrup and is currently taking one that contains DM. Patient would like to know if there are any recommendations regarding a cough syrup that she can take safely. Patient denies any shortness of breath, swelling, and states that she overall feels well since surgery.   Advised patient that I would forward message to Dr. [name] and our PharmD. Patient verbalized understanding.

## 2021-05-16 ENCOUNTER — Other Ambulatory Visit: Payer: Self-pay

## 2021-05-16 ENCOUNTER — Encounter: Payer: Self-pay | Admitting: Internal Medicine

## 2021-05-16 ENCOUNTER — Ambulatory Visit (INDEPENDENT_AMBULATORY_CARE_PROVIDER_SITE_OTHER): Payer: Medicare Other | Admitting: Internal Medicine

## 2021-05-16 VITALS — BP 110/68 | HR 85 | Ht 63.0 in | Wt 150.0 lb

## 2021-05-16 DIAGNOSIS — R002 Palpitations: Secondary | ICD-10-CM

## 2021-05-16 DIAGNOSIS — R0600 Dyspnea, unspecified: Secondary | ICD-10-CM | POA: Diagnosis not present

## 2021-05-16 DIAGNOSIS — I10 Essential (primary) hypertension: Secondary | ICD-10-CM

## 2021-05-16 DIAGNOSIS — D649 Anemia, unspecified: Secondary | ICD-10-CM | POA: Diagnosis not present

## 2021-05-16 DIAGNOSIS — Z9889 Other specified postprocedural states: Secondary | ICD-10-CM | POA: Diagnosis not present

## 2021-05-16 DIAGNOSIS — D4989 Neoplasm of unspecified behavior of other specified sites: Secondary | ICD-10-CM | POA: Diagnosis not present

## 2021-05-16 DIAGNOSIS — I3139 Other pericardial effusion (noninflammatory): Secondary | ICD-10-CM

## 2021-05-16 DIAGNOSIS — I313 Pericardial effusion (noninflammatory): Secondary | ICD-10-CM

## 2021-05-16 NOTE — Progress Notes (Signed)
Cardiology Office Note:    Date:  05/16/2021   ID:  Melissaann Dizdarevic, DOB Aug 08, 1946, MRN [id number]  PCP:  [name], PA  Cardiologist:  [name], MD  Electrophysiologist:  None   Referring MD: [name], PA   Chief Complaint/Reason for Referral: F/u pericardial effusion  History of Present Illness:    Autumn Conley is a 75 y.o. 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 Symptomatic anemia related to malignancy, now started on iron supplementation with IV iron.  Today, she is accompanied by her husband.  She reports she is feeling very good today. She was recently admitted to the ED for worsening shortness of breath. I discussed her care at the time with the consulting cardiology team and in light of persistent pericardial effusion that was slowly enlarging, we determined pericardial window likely next best step. She underwent this procedure on 04/19/21 with Dr. [name]. Pericardium and fluid was negative for malignancy on path evaluation. She feels nearly back to baseline and her shortness of breath is greatly improved.  The patient denies chest pain, chest pressure, dyspnea at rest or with exertion.  Notes occasional palpitations.  No PND, orthopnea, or leg swelling. Denies cough, fever, chills. Denies nausea, vomiting. Denies syncope or presyncope. Denies dizziness or lightheadedness.    Past Medical History:  Diagnosis Date   Anemia    Anxiety    Breast cancer ([place])    Depression    Dyspnea    Fibromyalgia    "some; not chronic" (11/29/2014)   GERD (gastroesophageal reflux disease)    Glaucoma of both eyes    Headache    Hypertension    IBS (irritable bowel syndrome)    Mitral valve prolapse    NASH (nonalcoholic steatohepatitis)    Osteoarthritis    PONV  (postoperative nausea and vomiting)    Stroke ([place])    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[facility])     Past Surgical History:  Procedure Laterality Date   [place]   BIOPSY  05/03/2020   Procedure: BIOPSY;  Surgeon: Gatha Mayer, MD;  Location: [place];  Service: Endoscopy;;   BREAST BIOPSY Left    BREAST LUMPECTOMY Left    CATARACT EXTRACTION, BILATERAL  2018   COLONOSCOPY WITH PROPOFOL N/A 05/03/2020   Procedure: COLONOSCOPY WITH PROPOFOL;  Surgeon: Gatha Mayer, MD;  Location: [place];  Service: Endoscopy;  Laterality: N/A;   ESOPHAGOGASTRODUODENOSCOPY (EGD) WITH PROPOFOL N/A 05/03/2020   Procedure: ESOPHAGOGASTRODUODENOSCOPY (EGD) WITH PROPOFOL;  Surgeon: Gatha Mayer, MD;  Location: [place];  Service: Endoscopy;  Laterality: N/A;   INTERCOSTAL NERVE BLOCK Right 04/19/2021   Procedure: INTERCOSTAL NERVE BLOCK;  Surgeon: Lajuana Matte, MD;  Location: [place];  Service: Thoracic;  Laterality: Right;   MASTECTOMY Left ~ 2009   PARASTERNAL EXPLORATION Right 02/14/2019   Procedure: PARASTERNAL MEDIAL EXPLORATION WITH BIOPIES.;  Surgeon: [name], MD;  Location: [place];  Service: Thoracic;  Laterality: Right;   POLYPECTOMY  05/03/2020   Procedure: POLYPECTOMY;  Surgeon: Gatha Mayer, MD;  Location: [facility] ENDOSCOPY;  Service: Endoscopy;;    Current Medications: Current Meds  Medication Sig   ALPRAZolam (XANAX) 0.25 MG tablet Take 0.5 tablets (0.125 mg total) by mouth daily as needed for anxiety.   Ascorbic Acid (VITAMIN  C) 1000 MG tablet Take 1,000 mg by mouth daily.   atorvastatin (LIPITOR) 40 MG tablet TAKE 1 TABLET BY MOUTH AT  BEDTIME   AZO-CRANBERRY PO Take 1 tablet by mouth 2 (two) times daily.   butalbital-acetaminophen-caffeine (FIORICET) 50-325-40 MG tablet TAKE 1 TO 2 TABLETS BY  MOUTH EVERY 6 HOURS AS  NEEDED FOR HEADACHE(S) .  MANUFACTURER RECOMMENDS NOT EXCEEDING 6 TABLETS/DAY   Cholecalciferol (VITAMIN D-3 PO) Take 1  capsule by mouth daily.   docusate sodium (COLACE) 100 MG capsule Take 100 mg by mouth 2 (two) times daily as needed for mild constipation or moderate constipation.   irbesartan (AVAPRO) 150 MG tablet TAKE 1 TABLET BY MOUTH  DAILY   ondansetron (ZOFRAN) 8 MG tablet Take 8 mg by mouth every 8 (eight) hours as needed for nausea or vomiting.   oxyCODONE (OXY IR/ROXICODONE) 5 MG immediate release tablet Take 1 tablet (5 mg total) by mouth every 6 (six) hours as needed for moderate pain. (Patient not taking: Reported on 06/07/2021)   pantoprazole (PROTONIX) 40 MG tablet Take 1 tablet (40 mg total) by mouth daily before breakfast.   PARoxetine (PAXIL) 20 MG tablet TAKE 3 TABLETS BY MOUTH IN  THE MORNING   polyethylene glycol powder (GLYCOLAX/MIRALAX) 17 GM/SCOOP powder Take 17 g by mouth daily as needed for mild constipation.   SUMAtriptan (IMITREX) 100 MG tablet Take 1 tablet (100 mg total) by mouth 2 (two) times daily as needed. At least 2 hrs between doses as needed bid   valACYclovir (VALTREX) 1000 MG tablet Take two tablets daily x 5 days. Then for future outbreaks, take 2 tablets by mouth every 12 hours for 1 day total   vitamin E 180 MG (400 UNITS) capsule Take 400 Units by mouth daily.   [DISCONTINUED] cyanocobalamin (,VITAMIN B-12,) 1000 MCG/ML injection Inject 1 mL (1,000 mcg total) into the muscle every 30 (thirty) days.     Allergies:   Latex   Social History   Tobacco Use   Smoking status: Never   Smokeless tobacco: Never  Vaping Use   Vaping Use: Never used  Substance Use Topics   Alcohol use: No   Drug use: No     Family History: The patient's family history includes Lung cancer (age of onset: 75) in her mother; Prostate cancer (age of onset: 1) in her brother. There is no history of Bladder Cancer or Kidney cancer.  ROS:   Please see the history of present illness.    (+) All other systems reviewed and are negative.  EKGs/Labs/Other Studies Reviewed:    The following  studies were reviewed today:  Echo 05/06/2021  IMPRESSIONS     1. Left ventricular ejection fraction, by estimation, is 60 to 65%. The  left ventricle has normal function. The left ventricle has no regional  wall motion abnormalities. There is mild concentric left ventricular  hypertrophy.   2. The mitral valve is abnormal. No evidence of mitral stenosis. Moderate  mitral annular calcification.   3. The aortic valve is calcified.   4. The inferior vena cava is normal in size with greater than 50%  respiratory variability, suggesting right atrial pressure of 3 mmHg.   Comparison(s): A prior study was performed on 04/18/21. Prior images  reviewed side by side. Improvement in pericardial effusion.   Cardiac Telemetry 12/26/2020  Patch Wear Time:  14 days and 0 hours (2022-02-15T15:09:43-0500 to 2022-03-01T15:09:43-0500)   Patient had a min HR of 45 bpm, max HR of 185  bpm, and avg HR of 66 bpm. Predominant underlying rhythm was Sinus Rhythm. Slight P wave morphology changes were noted. 15 Supraventricular Tachycardia runs occurred, the run with the fastest interval lasting  13 beats with a max rate of 185 bpm, the longest lasting 15 beats with an avg rate of 165 bpm. Junctional Rhythm was present. Junctional Rhythm was detected within +/- 45 seconds of symptomatic patient event(s). Isolated SVEs were rare (<1.0%), SVE Couplets were rare (<1.0%), and SVE Triplets were rare (<1.0%). Isolated VEs were rare (<1.0%), and no VE Couplets or VE Triplets were present. Ventricular Bigeminy was present.  Lexiscan 12/14/2020   The left ventricular ejection fraction is hyperdynamic (>65%). Nuclear stress EF: 82%. There was no ST segment deviation noted during stress. The study is normal. This is a low risk study.   Normal resting and stress perfusion. No ischemia or infarction EF 82%   EKG: 05/16/2021: Not completed today 03/25/2021: SR, PACs, low voltage QRS  I have independently reviewed  the images from echocardiogram 05/06/21 .  Recent Labs: 12/27/2020: TSH 2.120 04/09/2021: Magnesium 1.9 04/18/2021: B Natriuretic Peptide 163.9 04/21/2021: ALT 24 04/25/2021: BUN 11; Creatinine, Ser 0.65; Hemoglobin 11.5; Platelets 290; Potassium 4.3; Sodium 136  Recent Lipid Panel    Component Value Date/Time   CHOL 181 02/27/2020 0932   TRIG 159.0 (H) 02/27/2020 0932   HDL 38.60 (L) 02/27/2020 0932   CHOLHDL 5 02/27/2020 0932   VLDL 31.8 02/27/2020 0932   LDLCALC 111 (H) 02/27/2020 0932    Physical Exam:    VS:  BP 110/68 (BP Location: Right Arm, Patient Position: Sitting, Cuff Size: Normal)   Pulse 85   Ht 5' 3"  (1.6 m)   Wt 150 lb (68 kg)   BMI 26.57 kg/m     Wt Readings from Last 5 Encounters:  05/03/21 148 lb (67.1 kg)  04/25/21 153 lb (69.4 kg)  03/27/21 159 lb 6.4 oz (72.3 kg)  03/25/21 162 lb 3.2 oz (73.6 kg)  02/22/21 161 lb (73 kg)    Constitutional: No acute distress Eyes: sclera non-icteric, normal conjunctiva and lids ENMT: normal dentition, moist mucous membranes Cardiovascular: regular rhythm, normal rate, 2/6 SEM. S1 and S2 normal. Radial pulses normal bilaterally. No jugular venous distention.  Respiratory: clear to auscultation bilaterally GI : normal bowel sounds, soft and nontender. No distention.   MSK: extremities warm, well perfused. No edema.  NEURO: grossly nonfocal exam, moves all extremities. PSYCH: alert and oriented x 3, normal mood and affect.    ASSESSMENT:    1. Pericardial effusion   2. Dyspnea, unspecified type   3. S/P pericardial window creation   4. Palpitations   5. Essential hypertension   6. Anemia, unspecified type   7. Thymoma -- managed by [facility] - extensive documentation in Care Everywhere     PLAN:    Pericardial effusion -  Dyspnea, unspecified type S/p Pericardial window - she is now post pericardial window with resolution of the persistent pericardial effusion. She feels much better. We reviewed echo results  documenting that finding.   Palpitations - previously recommended BB, improved with iron therapy, no worrisome findings on monitor, consider BB if symptoms recur.   Essential hypertension - bp stable. Observe currently.   Anemia, unspecified type - managed by Heme/Onc with iron supplementation.  Total time of encounter: 30 minutes total time of encounter, including 20 minutes spent in face-to-face patient care on the date of this encounter. This time includes coordination of care and counseling  regarding above mentioned problem list. Remainder of non-face-to-face time involved reviewing chart documents/testing relevant to the patient encounter and documentation in the medical record. I have independently reviewed documentation from referring provider.   [name], MD, [facility]     Medication Adjustments/Labs and Tests Ordered: Current medicines are reviewed at length with the patient today.  Concerns regarding medicines are outlined above.   No orders of the defined types were placed in this encounter.   No orders of the defined types were placed in this encounter.   Patient Instructions  Medication Instructions:  No Changes In Medications at this time.  *If you need a refill on your cardiac medications before your next appointment, please call your pharmacy*  Follow-Up: At [facility], you and your health needs are our priority.  As part of our continuing mission to provide you with exceptional heart care, we have created designated Provider Care Teams.  These Care Teams include your primary Cardiologist (physician) and Advanced Practice Providers (APPs -  Physician Assistants and Nurse Practitioners) who all work together to provide you with the care you need, when you need it.  Your next appointment:   3 month(s)  The format for your next appointment:   In Person  Provider:   [name], MD     I,Zite Okoli,acting as a scribe for  [name], MD.,have documented all relevant documentation on the behalf of [name], MD,as directed by  [name], MD while in the presence of [name], MD.   I, [name], MD, have reviewed all documentation for this visit. The documentation on 05/16/21 for the exam, diagnosis, procedures, and orders are all accurate and complete.

## 2021-05-16 NOTE — Patient Instructions (Signed)
Medication Instructions:  No Changes In Medications at this time.  *If you need a refill on your cardiac medications before your next appointment, please call your pharmacy*  Follow-Up: At [facility], you and your health needs are our priority.  As part of our continuing mission to provide you with exceptional heart care, we have created designated Provider Care Teams.  These Care Teams include your primary Cardiologist (physician) and Advanced Practice Providers (APPs -  Physician Assistants and Nurse Practitioners) who all work together to provide you with the care you need, when you need it.  Your next appointment:   3 month(s)  The format for your next appointment:   In Person  Provider:   [name], MD

## 2021-05-23 DIAGNOSIS — H26492 Other secondary cataract, left eye: Secondary | ICD-10-CM | POA: Diagnosis not present

## 2021-05-27 ENCOUNTER — Other Ambulatory Visit: Payer: Self-pay | Admitting: Physician Assistant

## 2021-05-29 ENCOUNTER — Telehealth: Payer: Self-pay | Admitting: Pharmacist

## 2021-05-29 NOTE — Chronic Care Management (AMB) (Addendum)
Chronic Care Management Pharmacy Assistant   Name: Autumn Conley  MRN: [id number] DOB: [birth year]  Reason for Encounter: General Adherence Call    Recent office visits:  None  Recent consult visits:  05/16/2021 OV (cardiology) Autumn Conley; no medication changes indicated.  05/03/2021 OV (cardiothorac) Autumn Giovanni, PA-C; surgery follow up exam, OTC suppressants as needed for multifactorial chronic dry cough  03/27/2021 OV (pulmonology) Autumn Coder, Conley; Neurontin will be started at 300 mg daily for 1 week then 300 twice daily with option to increase dose as needed  03/26/2021 OV (hematology and oncology) Autumn Heinrich, Conley; follow up metastatic thymoma, no medication changes indicated.  03/25/2021 OV (cardiology) Autumn Conley; follow up pericardial effusion, she would like to try diuretic therapy. We discussed that this may not be helpful given no other signs of heart failure and hyperdynamic ventricle.  02/22/2021 OV (pulmonology) Autumn Coder, Conley; we will obtain a PET scan to follow-up on abnormal CT scan findings, no medication changes indicated.  Conley visits:  Medication Reconciliation was completed by comparing discharge summary, patient's EMR and Pharmacy list, and upon discussion with patient.  Admitted to the Conley on 04/18/2021 due to shortness of breath. Discharge date was 04/25/2021. Discharged from Autumn Conley Course:    Large pericardial effusion Bilateral pleural effusion right> left  -Prior to admission was followed by cardiology for pericardial effusion which was presumed to be postviral, admitted with worsening size and symptoms -underwent pericardial window and drainage of right pleural effusion on 7/2 by Dr. Kipp Conley  -Followed closely by Autumn Conley, left chest tube was removed 7/5 and right chest tube was removed 7/6 -Repeat echo revealed trivial pericardial effusion  -Pathology:  Pericardial biopsy was benign -Pleural effusion: Cytology noted reactive mesothelial cells only -Improved and stable clinically, follow-up arranged with Autumn Conley, patient is also advised to follow-up with her cardiologist Dr. Margaretann Conley in 1 to 2 weeks   AKI due to hypovolemic hypotension/ hyponatremia.  -Resolved with gentle hydration   Metastatic thymoma. Chronic anemia Sp radiation and chemotherapy. -Followed by oncology Autumn Conley at [facility]   HTN/ dyslipidemia.  -stable    Depression and anxiety.  -On  paroxetine and alprazolam.  Medications Discontinued at Conley Discharge: -Stopped estradiol and furosemide.  Medications that remain the same after Conley Discharge:??  -All other medications will remain the same.    Medications: Outpatient Encounter Medications as of 05/29/2021  Medication Sig   ALPRAZolam (XANAX) 0.25 MG tablet Take 0.5 tablets (0.125 mg total) by mouth daily as needed for anxiety.   Ascorbic Acid (VITAMIN C) 1000 MG tablet Take 1,000 mg by mouth daily.   atorvastatin (LIPITOR) 40 MG tablet TAKE 1 TABLET BY MOUTH AT  BEDTIME   AZO-CRANBERRY PO Take 1 tablet by mouth 2 (two) times daily.   B-D 3CC LUER-LOK SYR 25GX1" 25G X 1" 3 ML MISC USE TO INJECT  CYANOCOBALAMIN ONCE MONTHLY   butalbital-acetaminophen-caffeine (FIORICET) 50-325-40 MG tablet TAKE 1 TO 2 TABLETS BY  MOUTH EVERY 6 HOURS AS  NEEDED FOR HEADACHE(S) .  MANUFACTURER RECOMMENDS NOT EXCEEDING 6 TABLETS/DAY   Cholecalciferol (VITAMIN D-3 PO) Take 1 capsule by mouth daily.   cyanocobalamin (,VITAMIN B-12,) 1000 MCG/ML injection INJECT INTRAMUSCULARLY 1ML  EVERY 30 DAYS (DISCARD 28  DAYS AFTER FIRST USE)   docusate sodium (COLACE) 100 MG capsule Take 100 mg by mouth 2 (two) times daily as needed for mild  constipation or moderate constipation.   irbesartan (AVAPRO) 150 MG tablet TAKE 1 TABLET BY MOUTH  DAILY   ondansetron (ZOFRAN) 8 MG tablet Take 8 mg by mouth every 8 (eight) hours as needed  for nausea or vomiting.   oxyCODONE (OXY IR/ROXICODONE) 5 MG immediate release tablet Take 1 tablet (5 mg total) by mouth every 6 (six) hours as needed for moderate pain.   pantoprazole (PROTONIX) 40 MG tablet Take 1 tablet (40 mg total) by mouth daily before breakfast.   PARoxetine (PAXIL) 20 MG tablet TAKE 3 TABLETS BY MOUTH IN  THE MORNING   polyethylene glycol powder (GLYCOLAX/MIRALAX) 17 GM/SCOOP powder Take 17 g by mouth daily as needed for mild constipation.   SUMAtriptan (IMITREX) 100 MG tablet Take 1 tablet (100 mg total) by mouth 2 (two) times daily as needed. At least 2 hrs between doses as needed bid   valACYclovir (VALTREX) 1000 MG tablet Take two tablets daily x 5 days. Then for future outbreaks, take 2 tablets by mouth every 12 hours for 1 day total   vitamin E 180 MG (400 UNITS) capsule Take 400 Units by mouth daily.   No facility-administered encounter medications on file as of 05/29/2021.   Patient Questions: Have you had any problems recently with your health? Patient states she is healing well from her recent surgery. She states her breathing is improving.  Have you had any problems with your pharmacy? Patient states she has not had any problems recently with her pharmacy.  What issues or side effects are you having with your medications? Patient states she is not having any issues or side effects from any of her medications.  What would you like me to pass along to Autumn Conley, CPP for him to help you with?  Patient states she does not have anything to pass along at this time.  What can we do to take care of you better? Patient did not have any suggestions.  Future Appointments  Date Time Provider [place]  06/07/2021 12:30 PM Lajuana Matte, [facility]  07/17/2021 11:15 AM [place], Conley [facility] None  08/19/2021  8:40 AM Autumn Conley [facility]  08/27/2021  3:30 PM [facility] CCM PHARMACIST [facility] PEC    Star Rating  Drugs: Atorvastatin 40 mg last filled 03/21/2021 90 DS Irbesartan 150 mg last filled 03/18/2021 90 DS  Autumn Conley, [place] Pharmacist Assistant [phone]   5 minutes spent in review, coordination, and documentation.  Reviewed by: Beverly Milch, PharmD Clinical Pharmacist [phone]

## 2021-06-05 ENCOUNTER — Other Ambulatory Visit: Payer: Self-pay | Admitting: Thoracic Surgery (Cardiothoracic Vascular Surgery)

## 2021-06-05 DIAGNOSIS — J90 Pleural effusion, not elsewhere classified: Secondary | ICD-10-CM

## 2021-06-07 ENCOUNTER — Other Ambulatory Visit: Payer: Self-pay

## 2021-06-07 ENCOUNTER — Ambulatory Visit
Admission: RE | Admit: 2021-06-07 | Discharge: 2021-06-07 | Disposition: A | Discharge status: Home / Self Care | Payer: Medicare Other | Source: Ambulatory Visit | Attending: Thoracic Surgery (Cardiothoracic Vascular Surgery) | Admitting: Thoracic Surgery (Cardiothoracic Vascular Surgery)

## 2021-06-07 ENCOUNTER — Ambulatory Visit (INDEPENDENT_AMBULATORY_CARE_PROVIDER_SITE_OTHER): Payer: Self-pay | Admitting: Thoracic Surgery (Cardiothoracic Vascular Surgery)

## 2021-06-07 VITALS — BP 99/61 | HR 90 | Resp 20 | Ht 63.0 in | Wt 148.0 lb

## 2021-06-07 DIAGNOSIS — R918 Other nonspecific abnormal finding of lung field: Secondary | ICD-10-CM | POA: Diagnosis not present

## 2021-06-07 DIAGNOSIS — J90 Pleural effusion, not elsewhere classified: Secondary | ICD-10-CM

## 2021-06-07 DIAGNOSIS — Z8709 Personal history of other diseases of the respiratory system: Secondary | ICD-10-CM | POA: Diagnosis not present

## 2021-06-07 DIAGNOSIS — Z09 Encounter for follow-up examination after completed treatment for conditions other than malignant neoplasm: Secondary | ICD-10-CM

## 2021-06-07 NOTE — Progress Notes (Signed)
      [address]       [place] [address]             [phone]        Autumn Conley [place] Medical Record #[id number] Date of Birth: 08/13/1946  Referring: Donato Heinz* Primary Care: [name], [place] Primary Cardiologist:Gayatri Stann [place], MD  Reason for visit:   follow-up  History of Present Illness:     75 year old female with a history of chest radiation and an pericardial effusion presents for her follow-up appointment.  She underwent a left minithoracotomy with a pericardial window.  She is currently doing well.  Physical Exam: BP 99/61   Pulse 90   Resp 20   Ht 5' 3"  (1.6 m)   Wt 148 lb (67.1 kg)   SpO2 92% Comment: RA  BMI 26.22 kg/m   Alert NAD Incision clean.   Abdomen soft, ND No peripheral edema   Diagnostic Studies & Laboratory data: CXR: Clear Path: A. HEART, PERICARDIUM, RESECTION:  - Benign pericardium      Assessment / Plan:   75 year old female status post pericardial window. Currently doing well.  Follow-up as needed.   Lajuana Matte 06/07/2021 5:19 PM

## 2021-06-26 ENCOUNTER — Other Ambulatory Visit: Payer: Self-pay

## 2021-06-26 ENCOUNTER — Ambulatory Visit (INDEPENDENT_AMBULATORY_CARE_PROVIDER_SITE_OTHER): Payer: Medicare Other

## 2021-06-26 DIAGNOSIS — Z23 Encounter for immunization: Secondary | ICD-10-CM | POA: Diagnosis not present

## 2021-07-11 ENCOUNTER — Ambulatory Visit: Payer: PRIVATE HEALTH INSURANCE | Admitting: Urology

## 2021-07-17 ENCOUNTER — Ambulatory Visit: Payer: PRIVATE HEALTH INSURANCE | Admitting: Urology

## 2021-07-23 DIAGNOSIS — R0789 Other chest pain: Secondary | ICD-10-CM | POA: Diagnosis not present

## 2021-07-23 DIAGNOSIS — J90 Pleural effusion, not elsewhere classified: Secondary | ICD-10-CM | POA: Diagnosis not present

## 2021-07-23 DIAGNOSIS — D4989 Neoplasm of unspecified behavior of other specified sites: Secondary | ICD-10-CM | POA: Diagnosis not present

## 2021-07-23 DIAGNOSIS — Z853 Personal history of malignant neoplasm of breast: Secondary | ICD-10-CM | POA: Diagnosis not present

## 2021-07-23 DIAGNOSIS — D509 Iron deficiency anemia, unspecified: Secondary | ICD-10-CM | POA: Diagnosis not present

## 2021-07-23 DIAGNOSIS — C50912 Malignant neoplasm of unspecified site of left female breast: Secondary | ICD-10-CM | POA: Diagnosis not present

## 2021-07-23 DIAGNOSIS — R0600 Dyspnea, unspecified: Secondary | ICD-10-CM | POA: Diagnosis not present

## 2021-07-23 DIAGNOSIS — Z9012 Acquired absence of left breast and nipple: Secondary | ICD-10-CM | POA: Diagnosis not present

## 2021-07-23 DIAGNOSIS — I3139 Other pericardial effusion (noninflammatory): Secondary | ICD-10-CM | POA: Diagnosis not present

## 2021-07-30 DIAGNOSIS — Z923 Personal history of irradiation: Secondary | ICD-10-CM | POA: Diagnosis not present

## 2021-07-30 DIAGNOSIS — G893 Neoplasm related pain (acute) (chronic): Secondary | ICD-10-CM | POA: Diagnosis not present

## 2021-07-30 DIAGNOSIS — Z5111 Encounter for antineoplastic chemotherapy: Secondary | ICD-10-CM | POA: Diagnosis not present

## 2021-07-30 DIAGNOSIS — Z853 Personal history of malignant neoplasm of breast: Secondary | ICD-10-CM | POA: Diagnosis not present

## 2021-07-30 DIAGNOSIS — D4989 Neoplasm of unspecified behavior of other specified sites: Secondary | ICD-10-CM | POA: Diagnosis not present

## 2021-07-30 DIAGNOSIS — Z9012 Acquired absence of left breast and nipple: Secondary | ICD-10-CM | POA: Diagnosis not present

## 2021-07-30 DIAGNOSIS — C37 Malignant neoplasm of thymus: Secondary | ICD-10-CM | POA: Diagnosis not present

## 2021-08-19 ENCOUNTER — Other Ambulatory Visit: Payer: Self-pay

## 2021-08-19 ENCOUNTER — Ambulatory Visit (INDEPENDENT_AMBULATORY_CARE_PROVIDER_SITE_OTHER): Payer: Medicare Other | Admitting: Internal Medicine

## 2021-08-19 ENCOUNTER — Encounter: Payer: Self-pay | Admitting: Internal Medicine

## 2021-08-19 VITALS — BP 130/78 | HR 79 | Ht 63.0 in | Wt 151.0 lb

## 2021-08-19 DIAGNOSIS — I10 Essential (primary) hypertension: Secondary | ICD-10-CM | POA: Diagnosis not present

## 2021-08-19 DIAGNOSIS — R002 Palpitations: Secondary | ICD-10-CM | POA: Diagnosis not present

## 2021-08-19 DIAGNOSIS — I350 Nonrheumatic aortic (valve) stenosis: Secondary | ICD-10-CM

## 2021-08-19 DIAGNOSIS — I3139 Other pericardial effusion (noninflammatory): Secondary | ICD-10-CM | POA: Diagnosis not present

## 2021-08-19 DIAGNOSIS — R0600 Dyspnea, unspecified: Secondary | ICD-10-CM

## 2021-08-19 DIAGNOSIS — D4989 Neoplasm of unspecified behavior of other specified sites: Secondary | ICD-10-CM

## 2021-08-19 DIAGNOSIS — Z9889 Other specified postprocedural states: Secondary | ICD-10-CM | POA: Diagnosis not present

## 2021-08-19 NOTE — Progress Notes (Signed)
Cardiology Office Note:    Date:  08/19/2021   ID:  Autumn Conley, DOB 07/20/1946, MRN [id number]  PCP:  [name], PA  Cardiologist:  [name], MD  Electrophysiologist:  None   Referring MD: [name], PA   Chief Complaint/Reason for Referral: Follow up pericardial effusion s/p pericardial window  History of Present Illness:    Autumn Conley is a 75 y.o. female with a history of metastatic thyoma being followed by Oncology, Hx of Pagets disease of breast, HTN, HLD, anxiety, IBS, NASH, fibromyalgia who presents for follow up of generalized weakness, dizziness, shortness of breath with exertion. Symptomatic anemia related to malignancy, treated with iron supplementation with IV iron.  Status post pericardial window July 2022 for symptomatic pericardial effusion.  Recently she has been found to have recurrent disease on surveillance imaging related to metastatic thymoma and is undergoing chemotherapy intravenously with Dr. Mindi Junker through [facility].  She is following closely with oncology.  She is status post pericardial window in July 2022, and since that time her shortness of breath has essentially resolved.  She continues to have mild fatigue related to current oncologic illness, but is otherwise well.  We discussed echocardiogram findings from June and July in detail including mild aortic valve stenosis and no recurrence of the pericardial effusion as of July shortly after her procedure.  With no recurrence of shortness of breath symptoms, we discussed obtaining an echocardiogram in 6 months time primarily to follow the aortic valve stenosis and also surveillance of the pericardium for any constrictive features.  Her husband is with her today and asks whether IV access to the left arm which has previously been restricted due to prior mastectomy is advisable.  I have asked her to discuss this with Dr. Mindi Junker who may have her surgical records from prior  mastectomy so that he may provide guidance on access of the left arm if needed.  She has been treated for iron deficiency anemia, and is currently receiving both folate and B12 supplementation during chemotherapy.  The patient denies chest pain, chest pressure, dyspnea at rest or with exertion, palpitations, PND, orthopnea, or leg swelling. Denies cough, fever, chills. Denies syncope or presyncope. Denies dizziness or lightheadedness.   Past Medical History:  Diagnosis Date   Anemia    Anxiety    Breast cancer ([place])    Depression    Dyspnea    Fibromyalgia    "some; not chronic" (11/29/2014)   GERD (gastroesophageal reflux disease)    Glaucoma of both eyes    Headache    Hypertension    IBS (irritable bowel syndrome)    Mitral valve prolapse    NASH (nonalcoholic steatohepatitis)    Osteoarthritis    PONV (postoperative nausea and vomiting)    Stroke ([place])    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[facility])     Past Surgical History:  Procedure Laterality Date   [place]   BIOPSY  05/03/2020   Procedure: BIOPSY;  Surgeon: Gatha Mayer, MD;  Location: [place];  Service: Endoscopy;;   BREAST BIOPSY Left    BREAST LUMPECTOMY Left    CATARACT EXTRACTION, BILATERAL  2018   COLONOSCOPY WITH PROPOFOL N/A 05/03/2020   Procedure: COLONOSCOPY WITH PROPOFOL;  Surgeon: Gatha Mayer, MD;  Location: [place];  Service: Endoscopy;  Laterality: N/A;   ESOPHAGOGASTRODUODENOSCOPY (EGD) WITH PROPOFOL N/A 05/03/2020   Procedure: ESOPHAGOGASTRODUODENOSCOPY (EGD) WITH PROPOFOL;  Surgeon: Gatha Mayer, MD;  Location: [facility] ENDOSCOPY;  Service: Endoscopy;  Laterality: N/A;   INTERCOSTAL NERVE BLOCK Right 04/19/2021   Procedure: INTERCOSTAL NERVE BLOCK;  Surgeon: Lajuana Matte, MD;  Location: [place];  Service: Thoracic;  Laterality: Right;   MASTECTOMY Left ~ 2009   PARASTERNAL EXPLORATION Right 02/14/2019   Procedure: PARASTERNAL MEDIAL EXPLORATION WITH BIOPIES.;   Surgeon: [name], MD;  Location: [place];  Service: Thoracic;  Laterality: Right;   POLYPECTOMY  05/03/2020   Procedure: POLYPECTOMY;  Surgeon: Gatha Mayer, MD;  Location: [facility] ENDOSCOPY;  Service: Endoscopy;;    Current Medications: Current Meds  Medication Sig   ALPRAZolam (XANAX) 0.25 MG tablet Take 0.5 tablets (0.125 mg total) by mouth daily as needed for anxiety.   Ascorbic Acid (VITAMIN C) 1000 MG tablet Take 1,000 mg by mouth daily.   atorvastatin (LIPITOR) 40 MG tablet TAKE 1 TABLET BY MOUTH AT  BEDTIME   AZO-CRANBERRY PO Take 1 tablet by mouth 2 (two) times daily.   B-D 3CC LUER-LOK SYR 25GX1" 25G X 1" 3 ML MISC USE TO INJECT  CYANOCOBALAMIN ONCE MONTHLY   butalbital-acetaminophen-caffeine (FIORICET) 50-325-40 MG tablet TAKE 1 TO 2 TABLETS BY  MOUTH EVERY 6 HOURS AS  NEEDED FOR HEADACHE(S) .  MANUFACTURER RECOMMENDS NOT EXCEEDING 6 TABLETS/DAY   Cholecalciferol (VITAMIN D-3 PO) Take 1 capsule by mouth daily.   cyanocobalamin (,VITAMIN B-12,) 1000 MCG/ML injection INJECT INTRAMUSCULARLY 1ML  EVERY 30 DAYS (DISCARD 28  DAYS AFTER FIRST USE)   dexamethasone (DECADRON) 4 MG tablet Take 4 mg by mouth daily as needed.   docusate sodium (COLACE) 100 MG capsule Take 100 mg by mouth 2 (two) times daily as needed for mild constipation or moderate constipation.   folic acid (FOLVITE) 1 MG tablet Take 1 mg by mouth daily.   irbesartan (AVAPRO) 150 MG tablet TAKE 1 TABLET BY MOUTH  DAILY   ondansetron (ZOFRAN) 8 MG tablet Take 8 mg by mouth every 8 (eight) hours as needed for nausea or vomiting.   oxyCODONE (OXY IR/ROXICODONE) 5 MG immediate release tablet Take 1 tablet (5 mg total) by mouth every 6 (six) hours as needed for moderate pain.   pantoprazole (PROTONIX) 40 MG tablet Take 1 tablet (40 mg total) by mouth daily before breakfast.   PARoxetine (PAXIL) 20 MG tablet TAKE 3 TABLETS BY MOUTH IN  THE MORNING   polyethylene glycol powder (GLYCOLAX/MIRALAX) 17 GM/SCOOP powder Take 17 g  by mouth daily as needed for mild constipation.   SUMAtriptan (IMITREX) 100 MG tablet Take 1 tablet (100 mg total) by mouth 2 (two) times daily as needed. At least 2 hrs between doses as needed bid   triamcinolone (KENALOG) 0.025 % cream Apply topically.   valACYclovir (VALTREX) 1000 MG tablet Take two tablets daily x 5 days. Then for future outbreaks, take 2 tablets by mouth every 12 hours for 1 day total   vitamin E 180 MG (400 UNITS) capsule Take 400 Units by mouth daily.     Allergies:   Latex   Social History   Tobacco Use   Smoking status: Never   Smokeless tobacco: Never  Vaping Use   Vaping Use: Never used  Substance Use Topics   Alcohol use: No   Drug use: No     Family History: The patient's family history includes Lung cancer (age of onset: 45) in her mother; Prostate cancer (age of onset: 12) in her brother. There is no history of Bladder Cancer or Kidney cancer.  ROS:   Please see the history of present illness.    All other systems reviewed and are negative.  EKGs/Labs/Other Studies Reviewed:    The following studies were reviewed today:  EKG:  n/a  Imaging studies that I have independently reviewed today: n/a  Recent Labs: 12/27/2020: TSH 2.120 04/09/2021: Magnesium 1.9 04/18/2021: B Natriuretic Peptide 163.9 04/21/2021: ALT 24 04/25/2021: BUN 11; Creatinine, Ser 0.65; Hemoglobin 11.5; Platelets 290; Potassium 4.3; Sodium 136  Recent Lipid Panel    Component Value Date/Time   CHOL 181 02/27/2020 0932   TRIG 159.0 (H) 02/27/2020 0932   HDL 38.60 (L) 02/27/2020 0932   CHOLHDL 5 02/27/2020 0932   VLDL 31.8 02/27/2020 0932   LDLCALC 111 (H) 02/27/2020 0932    Physical Exam:    VS:  BP 130/78 (BP Location: Right Arm)   Pulse 79   Ht 5' 3"  (1.6 m)   Wt 151 lb (68.5 kg)   SpO2 98%   BMI 26.75 kg/m     Wt Readings from Last 5 Encounters:  08/19/21 151 lb (68.5 kg)  06/07/21 148 lb (67.1 kg)  05/16/21 150 lb (68 kg)  05/03/21 148 lb (67.1 kg)   04/25/21 153 lb (69.4 kg)    Constitutional: No acute distress Eyes: sclera non-icteric, normal conjunctiva and lids ENMT: normal dentition, moist mucous membranes Cardiovascular: regular rhythm, normal rate, 2/6 mid peaking systolic ejection murmur. S1 and S2 normal. No jugular venous distention.  Respiratory: clear to auscultation bilaterally GI : normal bowel sounds, soft and nontender. No distention.   MSK: extremities warm, well perfused. No edema.  NEURO: grossly nonfocal exam, moves all extremities. PSYCH: alert and oriented x 3, normal mood and affect.   ASSESSMENT:    1. S/P pericardial window creation   2. Pericardial effusion   3. Dyspnea, unspecified type   4. Aortic valve stenosis, etiology of cardiac valve disease unspecified   5. Palpitations   6. Thymoma -- managed by [facility] - extensive documentation in Care Everywhere   7. Essential hypertension    PLAN:    S/P pericardial window creation - Plan: ECHOCARDIOGRAM COMPLETE Pericardial effusion Dyspnea, unspecified type -No recurrence of symptoms to suggest reaccumulation of fluid and with pericardial window this should be relatively definitive therapy.  I have asked her to contact our office with any recurrence of symptoms therefore we can complete an echocardiogram sooner than 6 months from now.  Primary indication for repeat echo is monitoring of aortic valve disease but at the time of her next echo we will plan to evaluate for any constrictive features status post pericardial window.  She is overall doing well and shortness of breath has mostly resolved.  Aortic valve stenosis, etiology of cardiac valve disease unspecified - Plan: ECHOCARDIOGRAM COMPLETE -Mild on echo in June 2022, plan for echo spring/summer 2023  Palpitations-no current symptoms, improved with treatment of iron deficiency anemia.  Thymoma -- managed by [facility] - extensive documentation in Care Everywhere  Essential hypertension-blood pressure  grossly stable today, continue irbesartan 150 mg daily.  Total time of encounter: 30 minutes total time of encounter, including 20 minutes spent in face-to-face patient care on the date of this encounter. This time includes coordination of care and counseling regarding above mentioned problem list. Remainder of non-face-to-face time involved reviewing chart documents/testing relevant to the patient encounter and documentation in the medical record. I have independently reviewed documentation from referring provider.   [name], MD, [place]  Shared Decision Making/Informed Consent:       Medication Adjustments/Labs and Tests Ordered: Current medicines are reviewed at length with the patient today.  Concerns regarding medicines are outlined above.   No orders of the defined types were placed in this encounter.   No orders of the defined types were placed in this encounter.   There are no Patient Instructions on file for this visit.

## 2021-08-19 NOTE — Patient Instructions (Signed)
Medication Instructions:  No Changes In Medications at this time.   *If you need a refill on your cardiac medications before your next appointment, please call your pharmacy*  Testing/Procedures: Your physician has requested that you have an echocardiogram IN 6 MONTHS. Echocardiography is a painless test that uses sound waves to create images of your heart. It provides your doctor with information about the size and shape of your heart and how well your heart's chambers and valves are working. You may receive an ultrasound enhancing agent through an IV if needed to better visualize your heart during the echo.This procedure takes approximately one hour. There are no restrictions for this procedure. This will take place at the [address].   Follow-Up: At [facility], you and your health needs are our priority.  As part of our continuing mission to provide you with exceptional heart care, we have created designated Provider Care Teams.  These Care Teams include your primary Cardiologist (physician) and Advanced Practice Providers (APPs -  Physician Assistants and Nurse Practitioners) who all work together to provide you with the care you need, when you need it.  Your next appointment:   6 month(s) RIGHT AFTER ECHO  The format for your next appointment:   In Person  Provider:   [name], MD

## 2021-08-20 DIAGNOSIS — D630 Anemia in neoplastic disease: Secondary | ICD-10-CM | POA: Diagnosis not present

## 2021-08-20 DIAGNOSIS — Z5111 Encounter for antineoplastic chemotherapy: Secondary | ICD-10-CM | POA: Diagnosis not present

## 2021-08-20 DIAGNOSIS — G893 Neoplasm related pain (acute) (chronic): Secondary | ICD-10-CM | POA: Diagnosis not present

## 2021-08-20 DIAGNOSIS — Z9012 Acquired absence of left breast and nipple: Secondary | ICD-10-CM | POA: Diagnosis not present

## 2021-08-20 DIAGNOSIS — D4989 Neoplasm of unspecified behavior of other specified sites: Secondary | ICD-10-CM | POA: Diagnosis not present

## 2021-08-20 DIAGNOSIS — J90 Pleural effusion, not elsewhere classified: Secondary | ICD-10-CM | POA: Diagnosis not present

## 2021-08-20 DIAGNOSIS — Z9889 Other specified postprocedural states: Secondary | ICD-10-CM | POA: Diagnosis not present

## 2021-08-20 DIAGNOSIS — C37 Malignant neoplasm of thymus: Secondary | ICD-10-CM | POA: Diagnosis not present

## 2021-08-20 DIAGNOSIS — R918 Other nonspecific abnormal finding of lung field: Secondary | ICD-10-CM | POA: Diagnosis not present

## 2021-08-20 DIAGNOSIS — Z923 Personal history of irradiation: Secondary | ICD-10-CM | POA: Diagnosis not present

## 2021-08-20 DIAGNOSIS — Z853 Personal history of malignant neoplasm of breast: Secondary | ICD-10-CM | POA: Diagnosis not present

## 2021-08-20 DIAGNOSIS — I3139 Other pericardial effusion (noninflammatory): Secondary | ICD-10-CM | POA: Diagnosis not present

## 2021-08-26 NOTE — Progress Notes (Signed)
Chronic Care Management Pharmacy Note  08/27/2021 Name:  Autumn Conley MRN:  [id number] DOB:  Apr 13, 1946  Subjective: Autumn Conley is an 75 y.o. year old female who is a primary patient of Autumn Conley, [place].  The CCM team was consulted for assistance with disease management and care coordination needs.    Engaged with patient by telephone for follow up visit in response to provider referral for pharmacy case management and/or care coordination services.   Consent to Services:  The patient was given the following information about Chronic Care Management services today, agreed to services, and gave verbal consent: 1. CCM service includes personalized support from designated clinical staff supervised by the primary care provider, including individualized plan of care and coordination with other care providers 2. 24/7 contact phone numbers for assistance for urgent and routine care needs. 3. Service will only be billed when office clinical staff spend 20 minutes or more in a month to coordinate care. 4. Only one practitioner may furnish and bill the service in a calendar month. 5.The patient may stop CCM services at any time (effective at the end of the month) by phone call to the office staff. 6. The patient will be responsible for cost sharing (co-pay) of up to 20% of the service fee (after annual deductible is met). Patient agreed to services and consent obtained.  Patient Care Team: Autumn Conley, [place] as PCP - General (Physician Assistant) [name], MD as PCP - Cardiology (Cardiology) Bo Merino, MD as Consulting Physician (Rheumatology) Monna Fam, MD as Consulting Physician (Ophthalmology) Amil Amen, MD as Referring Physician (Psychiatry) Marcial Pacas, MD as Consulting Physician (Neurology) [name], MD (Inactive) as Consulting Physician (Cardiothoracic Surgery) Brantley Fling, MD as Consulting Physician (Oncology) Curt Bears, MD as  Consulting Physician (Oncology) Edythe Clarity, [facility] (Pharmacist)  Recent office visits:  02/13/21- Autumn Coke, PA- seen for urinary frequency, started colace 100 mg twice daily ,Use miralax 1 capful daily,labs done,  recommended follow up with urologist 02/05/21- Autumn Conley, Autumn Potash B, FNP-seen for urinary frequency, labs ordered, short course bactrim, follow up with urologist  08/29/20- Autumn Coke, PA- seen for anemia follow up, labs ordered, follow up 6 months    Recent consult visits:  01/29/21- Autumn Kaiser, MD (Cardiology)-seen for palpitations, scheduled echocardiogram, follow up after echocardiogram 01/17/21- Autumn Coder, MD( Pulmonology)- SOB follow up, recommended graded exercises as tolerated, follow up 4-6 weeks for breathing study 01/09/21- Autumn Madrid, MD (Urology)- seen for recurrent UTIs + stress incontinence, Start cranberry tablets, started estradiol 0.1 mg/gm, encouraged Kegel exercises for stress incontinence,follow up  6 months symptom check 01/08/21-Autumn Sarita Haver, MD( Hematology)  IV Iron 01/03/21- Autumn Conley, [facility])- seen for UTI sxs, labs performed, short course cefdinir 300 mg x7 DS, referral to urology  01/01/21-Autumn Sarita Haver, MD( Hematology)  IV Iron 12/26/20- [name], MD ( Cardio)- scheduled echocardiogram, referral to pulmonology, follow up 6 weeks 12/25/20- Autumn Sark, MD (Hematology)- seen for thymoma, imaging ordered, plan to give IV iron, ordered prescriptions benzonatate and pantoprazole, follow as scheduled 02/10/22[name], MD ( Cardio)- seen for palpitations, started metoprolol 12.46m BID, ordered 14- Telemetry monitor *Patient message*,ordered echocardiogram,  follow up  4-6 weeks 10/08/20- Autumn Server MD( Pain Medicine)- initial consult, referral to pain management 09/25/20- PZemple PA-C(Hematology)- short course prednisone 20 mg  x5 DS, short course  levofloxacin 500 mg x7DS, referral to pain clinic, follow up 3 months  09/10/20-  [name], MD ( Cardio)- seen for Presyncope and palpitations, follow up 3 months   Objective:  Lab Results  Component Value Date   CREATININE 0.65 04/25/2021   CREATININE 0.64 04/24/2021   CREATININE 0.56 04/23/2021    Lab Results  Component Value Date   HGBA1C 6.1 (H) 11/30/2014   Last diabetic Eye exam: No results found for: HMDIABEYEEXA  Last diabetic Foot exam: No results found for: HMDIABFOOTEX      Component Value Date/Time   CHOL 181 02/27/2020 0932   TRIG 159.0 (H) 02/27/2020 0932   HDL 38.60 (L) 02/27/2020 0932   CHOLHDL 5 02/27/2020 0932   VLDL 31.8 02/27/2020 0932   LDLCALC 111 (H) 02/27/2020 0932    Hepatic Function Latest Ref Rng & Units 04/21/2021 08/29/2020 07/02/2020  Total Protein 6.5 - 8.1 g/dL 5.4(L) 6.8 6.7  Albumin 3.5 - 5.0 g/dL 2.8(L) - -  AST 15 - 41 U/L 19 27 72(H)  ALT 0 - 44 U/L 24 24 55(H)  Alk Phosphatase 38 - 126 U/L 92 - -  Total Bilirubin 0.3 - 1.2 mg/dL 0.7 0.4 0.4  Bilirubin, Direct 0.0 - 0.5 mg/dL - - -    Lab Results  Component Value Date/Time   TSH 2.120 12/27/2020 02:29 PM   TSH 2.14 05/01/2020 02:05 PM   FREET4 0.70 12/08/2018 12:15 PM    CBC Latest Ref Rng & Units 04/25/2021 04/23/2021 04/21/2021  WBC 4.0 - 10.5 K/uL 5.5 4.9 8.6  Hemoglobin 12.0 - 15.0 g/dL 11.5(L) 10.1(L) 9.4(L)  Hematocrit 36.0 - 46.0 % 34.1(L) 31.2(L) 28.3(L)  Platelets 150 - 400 K/uL 290 240 250    Lab Results  Component Value Date/Time   VD25OH 36.03 02/27/2020 09:32 AM   VD25OH 38.37 07/28/2019 08:02 AM   Clinical ASCVD:  The 10-year ASCVD risk score (Arnett DK, et al., 2019) is: 21.8%   Values used to calculate the score:     Age: 62 years     Sex: Female     Is Non-Hispanic African American: No     Diabetic: No     Tobacco smoker: No     Systolic Blood Pressure: 030 mmHg     Is BP treated: Yes     HDL Cholesterol: 38.6 mg/dL     Total Cholesterol: 181  mg/dL    Other: (CHADS2VASc if Afib, PHQ9 if depression, MMRC or CAT for COPD, ACT, DEXA)  Social History   Tobacco Use  Smoking Status Never  Smokeless Tobacco Never   BP Readings from Last 3 Encounters:  08/19/21 130/78  06/07/21 99/61  05/16/21 110/68   Pulse Readings from Last 3 Encounters:  08/19/21 79  06/07/21 90  05/16/21 85   Wt Readings from Last 3 Encounters:  08/19/21 151 lb (68.5 kg)  06/07/21 148 lb (67.1 kg)  05/16/21 150 lb (68 kg)    Assessment: Review of patient past medical history, allergies, medications, health status, including review of consultants reports, laboratory and other test data, was performed as part of comprehensive evaluation and provision of chronic care management services.   SDOH:  (Social Determinants of Health) assessments and interventions performed: Yes.    CCM Care Plan  Allergies  Allergen Reactions   Latex Rash and Other (See Comments)    Blisters, also    Medications Reviewed Today     Reviewed by Edythe Clarity, [facility] (Pharmacist) on 08/27/21 at 8  Med List Status: <None>   Medication Order Taking? Sig Documenting Provider Last  Dose Status Informant  ALPRAZolam (XANAX) 0.25 MG tablet [id number] Yes Take 0.5 tablets (0.125 mg total) by mouth daily as needed for anxiety. Autumn Conley, [place] Taking Active Self  Ascorbic Acid (VITAMIN C) 1000 MG tablet [id number] Yes Take 1,000 mg by mouth daily. [provider] Taking Active Self  atorvastatin (LIPITOR) 40 MG tablet [id number] Yes TAKE 1 TABLET BY MOUTH AT  BEDTIME Autumn Conley, [place] Taking Active Self  AZO-CRANBERRY PO [id number] Yes Take 1 tablet by mouth 2 (two) times daily. [provider] Taking Active Self  Conley-D 3CC LUER-LOK SYR 25GX1" 25G X 1" 3 ML MISC [id number] Yes USE TO INJECT  CYANOCOBALAMIN ONCE Pat Patrick, [place], [place] Taking Active   butalbital-acetaminophen-caffeine (FIORICET) 50-325-40 MG tablet [id number] Yes TAKE 1 TO 2 TABLETS BY   MOUTH EVERY 6 HOURS AS  NEEDED FOR HEADACHE(S) .  MANUFACTURER RECOMMENDS NOT EXCEEDING 6 TABLETS/DAY Autumn Conley, [place] Taking Active Self  Cholecalciferol (VITAMIN D-3 PO) [id number] Yes Take 1 capsule by mouth daily. [provider] Taking Active Self  cyanocobalamin (,VITAMIN Conley-12,) 1000 MCG/ML injection [id number] Yes INJECT INTRAMUSCULARLY 1ML  EVERY 30 DAYS (DISCARD 28  DAYS AFTER FIRST USE) Autumn Coke, PA Taking Active   dexamethasone (DECADRON) 4 MG tablet [id number] Yes Take 4 mg by mouth daily as needed. [provider] Taking Active   docusate sodium (COLACE) 100 MG capsule [id number] Yes Take 100 mg by mouth 2 (two) times daily as needed for mild constipation or moderate constipation. [provider] Taking Active Self  folic acid (FOLVITE) 1 MG tablet [id number] Yes Take 1 mg by mouth daily. [provider] Taking Active   irbesartan (AVAPRO) 150 MG tablet [id number] Yes TAKE 1 TABLET BY MOUTH  DAILY Leamon Arnt, MD Taking Active Self  ondansetron (ZOFRAN) 8 MG tablet [id number] Yes Take 8 mg by mouth every 8 (eight) hours as needed for nausea or vomiting. [provider] Taking Active Self  oxyCODONE (OXY IR/ROXICODONE) 5 MG immediate release tablet [id number] Yes Take 1 tablet (5 mg total) by mouth every 6 (six) hours as needed for moderate pain. Lars Pinks M, PA-C Taking Active   pantoprazole (PROTONIX) 40 MG tablet [id number] Yes Take 1 tablet (40 mg total) by mouth daily before breakfast. Kerney Elbe, DO Taking Active Self  PARoxetine (PAXIL) 20 MG tablet [id number] Yes TAKE 3 TABLETS BY MOUTH IN  THE [place], [place], [place] Taking Active Self  polyethylene glycol powder (GLYCOLAX/MIRALAX) 17 GM/SCOOP powder [id number] Yes Take 17 g by mouth daily as needed for mild constipation. [provider] Taking Active Self  SUMAtriptan (IMITREX) 100 MG tablet [id number] Yes Take 1 tablet (100 mg total) by mouth 2 (two)  times daily as needed. At least 2 hrs between doses as needed bid Vivi Barrack, MD Taking Active Self  triamcinolone (KENALOG) 0.025 % cream [id number] Yes Apply topically. [provider] Taking Active   valACYclovir (VALTREX) 1000 MG tablet [id number] Yes Take two tablets daily x 5 days. Then for future outbreaks, take 2 tablets by mouth every 12 hours for 1 day total [place], [place], [place] Taking Active Self  vitamin E 180 MG (400 UNITS) capsule [id number] Yes Take 400 Units by mouth daily. [provider] Taking Active Self            Patient Active Problem List   Diagnosis Date Noted   S/P lung surgery, follow-up exam    Acute on chronic diastolic CHF (congestive heart failure) ([place]) 04/18/2021  CHF (congestive heart failure) ([place]) 04/18/2021   Pericardial effusion 04/18/2021   Pleural effusion 04/18/2021   Recurrent UTI 06/28/2020   Inflammatory arthritis 05/16/2020   Erosive gastritis    Benign neoplasm of ascending colon    Heme + stool    Change in bowel habit    Anemia 05/01/2020   Dyspnea 05/01/2020   Generalized weakness 05/01/2020   UTI (urinary tract infection) 05/01/2020   Symptomatic anemia 05/01/2020   Glaucoma of both eyes    NASH (nonalcoholic steatohepatitis)    Mitral valve prolapse    Thymoma -- managed by [facility] - extensive documentation in Care Everywhere 03/22/2019   Breast cancer ([place]) 03/18/2019   Mass of upper lobe of right lung 01/20/2019   B12 deficiency 12/19/2018   Stress incontinence in female 12/19/2018   Dyslipidemia 12/14/2018   OSA (obstructive sleep apnea) 12/07/2018   History of breast cancer 12/07/2018   Cognitive complaints 11/25/2018   False positive stress test 01/29/2015   Migraine without aura and without status migrainosus, not intractable 12/18/2014   Small vessel disease, cerebrovascular 12/18/2014   GERD (gastroesophageal reflux disease) 12/02/2014   HTN (hypertension) 12/27/2013   Anxiety 12/27/2013    Depression 11/03/2012   Elevated LFTs 11/03/2012   Fibromyalgia 11/03/2012   IBS (irritable bowel syndrome) 11/03/2012   Lymphocytic colitis 11/03/2012   Paget's disease of nipple ([place]) 11/03/2012   Vitamin D deficiency 11/03/2012   Osteoarthritis 11/03/2012    Immunization History  Administered Date(s) Administered   Fluad Quad(high Dose 65+) 07/28/2019, 07/02/2020, 06/26/2021   Influenza Split 08/10/2014   Influenza Whole 08/04/2017   Influenza-Unspecified 07/20/2014, 07/20/2018   PFIZER(Purple Top)SARS-COV-2 Vaccination 12/12/2019, 01/02/2020   Pneumococcal Conjugate-13 08/16/2015   Pneumococcal Polysaccharide-23 07/17/2020   Tdap 12/08/2018   Conditions to be addressed/monitored: HLD HTN OSA GERD MDD Recurrent UTI Stress incontinence  Care Plan : [place]  Updates made by Edythe Clarity, [place] since 08/27/2021 12:00 AM     Problem: HLD HTN OSA GERD MDD Recurrent UTI Stress incontinence   Priority: High     Long-Range Goal: Disease Management   Start Date: 02/21/2021  Expected End Date: 02/21/2022  Recent Progress: On track  Priority: High  Note:     Current Barriers:  Could work towards LDL goal < 100 - strength/exercise tolerance now improving   Pharmacist Clinical Goal(s):  Patient will contact provider office for questions/concerns as evidenced notation of same in electronic health record through collaboration with PharmD and provider.   Interventions: 1:1 collaboration with Autumn Coke, PA regarding development and update of comprehensive plan of care as evidenced by provider attestation and co-signature Inter-disciplinary care team collaboration (see longitudinal plan of care) Comprehensive medication review performed; medication list updated in electronic medical record No changes to medications   Hypertension (BP goal <130/80) -Controlled, consistently at goal at OVs -Current treatment: Irbesartan 150 mg once daily  -No home  monitoring, has home cuff if needed  -Current dietary habits: low fat diet. Current exercise habits: starting to increase exercise frequency - hgb had dropped to 6.6 04/2020, improved with iron infxns to 11.6 02/13/2021. Feeling much better now.  -Denies hypotensive/hypertensive symptoms -Counseled to monitor BP at home as directed, document, and provide log at future appointments -Recommended to continue current medication  Update 08/27/21 Hgb continues to improve - 12.0 at last lab checkup Not checking BP at home, however remains controlled at all of her MD visits Continue current medication  Monitor for s/sx of hypotension  Hyperlipidemia: (LDL goal < 100) -Not ideally controlled, last LDL 111, consistently close to goal (+/-) -10 yr ASCVD 16%, primary prevention  -Current treatment: Atorvastatin 40 mg once daily  -Denies tolerability issues or concerns -Educated on Cholesterol goals; patient to start walking more as tolerated -Recommended to continue current medication  Update 08/27/21 Patient continues to be adherent to medication No cholesterol labs done since 02/2020.  Reminded patient to schedule appointment with Sam ASAP.  She was also due for medicare AWV - scheduiled appt for later this month. Recheck lipids - as long as they remain stable we could continue current medication. Work on exercise as previous.  GERD/errosive gastritis (Goal to minimize symptoms) -Controlled, no issues or need for trigger avoidance -Receives B12 injections at home - daughter provides them -Avoids NSAIDs, occasional use of APAP for OA pain.  -Current regimen Pantoprazole 40 mg once daily -Continue current management  Depression (Goal: minimize symptoms) -Controlled -Current treatment: Paroxetine 60 mg once daily -PHQ9: 0. Denies any worsening symptoms or side effects. Feels content with management -Educated on Benefits of medication for symptom control -Recommended to continue current  medication  Osteopenia (Goal maintain bone density, ensure optimization of supplements) -Controlled -Last DEXA Scan: 02/2020 - osteopenia w/ plan to repeat in 2 yrs. FRAX score: 10 year major osteoporotic risk: 7.6%. 10 year hip fracture risk: 0.7%. -Long term SSRI and PPI use -Patient is not a candidate for pharmacologic treatment -Current treatment  Vitamin D3 1000 units daily  -Recommend weight-bearing and muscle strengthening exercises for building and maintaining bone density. -Recommended to continue current medication  Patient Goals/Self-Care Activities Patient will:  - take medications as prescribed target a minimum of 150 minutes of moderate intensity exercise weekly  Follow Up Plan: [place] f/u telephone visit 6-8 months Medication Assistance: None required.  Patient affirms current coverage meets needs.  Patient's preferred pharmacy is:  [id number] [place] [address]Phone: [phone]: [address] [address] [address] [place]Phone: [phone]: [phone] Follow Up:  Patient agrees to Care Plan and Follow-up.  Plan: rph f/u telephone call 6 months         Future Appointments  Date Time Provider [place] 09/17/2021  9:30 AM [facility] HEALTH COACH [facility] 01/20/2022 10:30 AM [facility] [place] PharmD Clinical Pharmacist  [facility]([phone]

## 2021-08-27 ENCOUNTER — Other Ambulatory Visit: Payer: Self-pay | Admitting: *Deleted

## 2021-08-27 ENCOUNTER — Ambulatory Visit (INDEPENDENT_AMBULATORY_CARE_PROVIDER_SITE_OTHER): Payer: Medicare Other | Admitting: Pharmacist

## 2021-08-27 DIAGNOSIS — I10 Essential (primary) hypertension: Secondary | ICD-10-CM

## 2021-08-27 DIAGNOSIS — E785 Hyperlipidemia, unspecified: Secondary | ICD-10-CM

## 2021-08-27 MED ORDER — BUTALBITAL-APAP-CAFFEINE 50-325-40 MG PO TABS: ORAL_TABLET | ORAL | 1 refills | Status: DC

## 2021-08-27 NOTE — Telephone Encounter (Signed)
Pt requesting refill on her Fioricet. Last OV 02/13/2021.

## 2021-08-27 NOTE — Patient Instructions (Addendum)
Visit Information   Goals Addressed             This Visit's Progress    LDL < 100       Timeframe:  Long-Range Goal Priority:  High Start Date:  08/27/21                           Expected End Date: 02/24/22                    Follow Up Date 11/27/21   Work towards LDL < 100   Why is this important?   Reduce risk for stroke and heart attack.     Notes:        Patient Care Plan: CCM Pharmacy Care Plan     Problem Identified: HLD HTN OSA GERD MDD Recurrent UTI Stress incontinence   Priority: High     Long-Range Goal: Disease Management   Start Date: 02/21/2021  Expected End Date: 02/21/2022  Recent Progress: On track  Priority: High  Note:     Current Barriers:  Could work towards LDL goal < 100 - strength/exercise tolerance now improving   Pharmacist Clinical Goal(s):  Patient will contact provider office for questions/concerns as evidenced notation of same in electronic health record through collaboration with PharmD and provider.   Interventions: 1:1 collaboration with [name], PA regarding development and update of comprehensive plan of care as evidenced by provider attestation and co-signature Inter-disciplinary care team collaboration (see longitudinal plan of care) Comprehensive medication review performed; medication list updated in electronic medical record No changes to medications   Hypertension (BP goal <130/80) -Controlled, consistently at goal at OVs -Current treatment: Irbesartan 150 mg once daily  -No home monitoring, has home cuff if needed  -Current dietary habits: low fat diet. Current exercise habits: starting to increase exercise frequency - hgb had dropped to 6.6 04/2020, improved with iron infxns to 11.6 02/13/2021. Feeling much better now.  -Denies hypotensive/hypertensive symptoms -Counseled to monitor BP at home as directed, document, and provide log at future appointments -Recommended to continue current medication  Update  08/27/21 Hgb continues to improve - 12.0 at last lab checkup Not checking BP at home, however remains controlled at all of her MD visits Continue current medication  Monitor for s/sx of hypotension  Hyperlipidemia: (LDL goal < 100) -Not ideally controlled, last LDL 111, consistently close to goal (+/-) -10 yr ASCVD 16%, primary prevention  -Current treatment: Atorvastatin 40 mg once daily  -Denies tolerability issues or concerns -Educated on Cholesterol goals; patient to start walking more as tolerated -Recommended to continue current medication  Update 08/27/21 Patient continues to be adherent to medication No cholesterol labs done since 02/2020.  Reminded patient to schedule appointment with Sam ASAP.  She was also due for medicare AWV - scheduiled appt for later this month. Recheck lipids - as long as they remain stable we could continue current medication. Work on exercise as previous.  GERD/errosive gastritis (Goal to minimize symptoms) -Controlled, no issues or need for trigger avoidance -Receives B12 injections at home - daughter provides them -Avoids NSAIDs, occasional use of APAP for OA pain.  -Current regimen Pantoprazole 40 mg once daily -Continue current management  Depression (Goal: minimize symptoms) -Controlled -Current treatment: Paroxetine 60 mg once daily -PHQ9: 0. Denies any worsening symptoms or side effects. Feels content with management -Educated on Benefits of medication for symptom control -Recommended to continue current medication  Osteopenia (  Goal maintain bone density, ensure optimization of supplements) -Controlled -Last DEXA Scan: 02/2020 - osteopenia w/ plan to repeat in 2 yrs. FRAX score: 10 year major osteoporotic risk: 7.6%. 10 year hip fracture risk: 0.7%. -Long term SSRI and PPI use -Patient is not a candidate for pharmacologic treatment -Current treatment  Vitamin D3 1000 units daily  -Recommend weight-bearing and muscle strengthening  exercises for building and maintaining bone density. -Recommended to continue current medication  Patient Goals/Self-Care Activities Patient will:  - take medications as prescribed target a minimum of 150 minutes of moderate intensity exercise weekly  Follow Up Plan: [place] f/u telephone visit 6-8 months Medication Assistance: None required.  Patient affirms current coverage meets needs.  Patient's preferred pharmacy [facility]:  [id number] [place] [address] [place]Phone: [phone]: [address] [address] [place]Phone: [phone]: [phone] Follow Up:  Patient agrees to Care Plan and Follow-up.  Plan: rph f/u telephone call 6 months         Patient verbalizes understanding of instructions provided today and agrees to view in [place]  Telephone follow up appointment with pharmacy team member scheduled for: 6 months  CEdythe Clarity [place]

## 2021-08-30 DIAGNOSIS — Z452 Encounter for adjustment and management of vascular access device: Secondary | ICD-10-CM | POA: Diagnosis not present

## 2021-09-10 DIAGNOSIS — D4989 Neoplasm of unspecified behavior of other specified sites: Secondary | ICD-10-CM | POA: Diagnosis not present

## 2021-09-10 DIAGNOSIS — C381 Malignant neoplasm of anterior mediastinum: Secondary | ICD-10-CM | POA: Diagnosis not present

## 2021-09-10 DIAGNOSIS — Z95828 Presence of other vascular implants and grafts: Secondary | ICD-10-CM | POA: Diagnosis not present

## 2021-09-10 DIAGNOSIS — G893 Neoplasm related pain (acute) (chronic): Secondary | ICD-10-CM | POA: Diagnosis not present

## 2021-09-17 ENCOUNTER — Encounter: Payer: Self-pay | Admitting: Internal Medicine

## 2021-09-17 ENCOUNTER — Other Ambulatory Visit: Payer: Self-pay

## 2021-09-17 ENCOUNTER — Ambulatory Visit (INDEPENDENT_AMBULATORY_CARE_PROVIDER_SITE_OTHER): Payer: Medicare Other

## 2021-09-17 DIAGNOSIS — Z0000 Encounter for general adult medical examination without abnormal findings: Secondary | ICD-10-CM | POA: Diagnosis not present

## 2021-09-17 NOTE — Progress Notes (Signed)
Virtual Visit via Telephone Note  I connected with  Autumn Conley on 09/17/21 at  9:30 AM EST by telephone and verified that I am speaking with the correct person using two identifiers.  Medicare Annual Wellness visit completed telephonically due to Covid-19 pandemic.   Persons participating in this call: This Health Coach and this patient.   Location: Patient: Home Provider: Office   I discussed the limitations, risks, security and privacy concerns of performing an evaluation and management service by telephone and the availability of in person appointments. The patient expressed understanding and agreed to proceed.  Unable to perform video visit due to video visit attempted and failed and/or patient does not have video capability.   Some vital signs may be absent or patient reported.   Willette Brace, LPN   Subjective:   Autumn Conley is a 75 y.o. female who presents for an Initial Medicare Annual Wellness Visit.  Review of Systems     Cardiac Risk Factors include: advanced age (>26mn, >>68women);hypertension     Objective:    There were no vitals filed for this visit. There is no height or weight on file to calculate BMI.  Advanced Directives 09/17/2021 05/08/2020 05/02/2020 02/14/2019 02/14/2019 02/10/2019 01/31/2019  Does Patient Have a Medical Advance Directive? Yes Yes Yes - No No No  Type of AParamedicof AWailua HomesteadsLiving will HRoxieLiving will HForsythLiving will - - - -  Does patient want to make changes to medical advance directive? - - No - Patient declined - - - -  Copy of [place] Chart? No - copy requested - No - copy requested - - - -  Would patient like information on creating a medical advance directive? - - - No - Patient declined - Yes (MAU/Ambulatory/Procedural Areas - Information given) Yes (MAU/Ambulatory/Procedural Areas - Information given)    Current Medications  (verified) Outpatient Encounter Medications as of 09/17/2021  Medication Sig   ALPRAZolam (XANAX) 0.25 MG tablet Take 0.5 tablets (0.125 mg total) by mouth daily as needed for anxiety.   Ascorbic Acid (VITAMIN C) 1000 MG tablet Take 1,000 mg by mouth daily.   atorvastatin (LIPITOR) 40 MG tablet TAKE 1 TABLET BY MOUTH AT  BEDTIME   AZO-CRANBERRY PO Take 1 tablet by mouth 2 (two) times daily.   B-D 3CC LUER-LOK SYR 25GX1" 25G X 1" 3 ML MISC USE TO INJECT  CYANOCOBALAMIN ONCE MONTHLY   butalbital-acetaminophen-caffeine (FIORICET) 50-325-40 MG tablet TAKE 1 TO 2 TABLETS BY  MOUTH EVERY 6 HOURS AS  NEEDED FOR HEADACHE(S) .  MANUFACTURER RECOMMENDS NOT EXCEEDING 6 TABLETS/DAY   Cholecalciferol (VITAMIN D-3 PO) Take 1 capsule by mouth daily.   cyanocobalamin (,VITAMIN B-12,) 1000 MCG/ML injection INJECT INTRAMUSCULARLY 1ML  EVERY 30 DAYS (DISCARD 28  DAYS AFTER FIRST USE)   dexamethasone (DECADRON) 4 MG tablet Take 4 mg by mouth daily as needed.   docusate sodium (COLACE) 100 MG capsule Take 100 mg by mouth 2 (two) times daily as needed for mild constipation or moderate constipation.   folic acid (FOLVITE) 1 MG tablet Take 1 mg by mouth daily.   irbesartan (AVAPRO) 150 MG tablet TAKE 1 TABLET BY MOUTH  DAILY   ondansetron (ZOFRAN) 8 MG tablet Take 8 mg by mouth every 8 (eight) hours as needed for nausea or vomiting.   pantoprazole (PROTONIX) 40 MG tablet Take 1 tablet (40 mg total) by mouth daily before breakfast.   PARoxetine (  PAXIL) 20 MG tablet TAKE 3 TABLETS BY MOUTH IN  THE MORNING   polyethylene glycol powder (GLYCOLAX/MIRALAX) 17 GM/SCOOP powder Take 17 g by mouth daily as needed for mild constipation.   SUMAtriptan (IMITREX) 100 MG tablet Take 1 tablet (100 mg total) by mouth 2 (two) times daily as needed. At least 2 hrs between doses as needed bid   triamcinolone (KENALOG) 0.025 % cream Apply topically.   valACYclovir (VALTREX) 1000 MG tablet Take two tablets daily x 5 days. Then for future  outbreaks, take 2 tablets by mouth every 12 hours for 1 day total   vitamin E 180 MG (400 UNITS) capsule Take 400 Units by mouth daily.   oxyCODONE (OXY IR/ROXICODONE) 5 MG immediate release tablet Take 1 tablet (5 mg total) by mouth every 6 (six) hours as needed for moderate pain. (Patient not taking: Reported on 09/17/2021)   No facility-administered encounter medications on file as of 09/17/2021.    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[facility])    Past Surgical History:  Procedure Laterality Date   [place]   BIOPSY  05/03/2020   Procedure: BIOPSY;  Surgeon: Gatha Mayer, MD;  Location: [place];  Service: Endoscopy;;   BREAST BIOPSY Left    BREAST LUMPECTOMY Left    CATARACT EXTRACTION, BILATERAL  2018   COLONOSCOPY WITH PROPOFOL N/A 05/03/2020   Procedure: COLONOSCOPY WITH PROPOFOL;  Surgeon: Gatha Mayer, MD;  Location: [place];  Service: Endoscopy;  Laterality: N/A;   ESOPHAGOGASTRODUODENOSCOPY (EGD) WITH PROPOFOL N/A 05/03/2020   Procedure: ESOPHAGOGASTRODUODENOSCOPY (EGD) WITH PROPOFOL;  Surgeon: Gatha Mayer, MD;  Location: [place];  Service: Endoscopy;  Laterality: N/A;   INTERCOSTAL NERVE BLOCK Right 04/19/2021   Procedure: INTERCOSTAL NERVE BLOCK;  Surgeon: Lajuana Matte, MD;  Location: [place];  Service: Thoracic;  Laterality: Right;   MASTECTOMY Left ~ 2009   PARASTERNAL EXPLORATION Right 02/14/2019   Procedure: PARASTERNAL MEDIAL EXPLORATION WITH BIOPIES.;   Surgeon: [name], MD;  Location: [place];  Service: Thoracic;  Laterality: Right;   POLYPECTOMY  05/03/2020   Procedure: POLYPECTOMY;  Surgeon: Gatha Mayer, MD;  Location: [facility] ENDOSCOPY;  Service: Endoscopy;;   Family History  Problem Relation Age of Onset   Lung cancer Mother 86   Prostate cancer Brother 58   Bladder Cancer Neg Hx    Kidney cancer Neg Hx    Social History   Socioeconomic History   Marital status: Married    Spouse name: Not on file   Number of children: 1   Years of education: 14   Highest education level: Not on file  Occupational History   Occupation: Retired  Tobacco Use   Smoking status: Never   Smokeless tobacco: Never  Vaping Use   Vaping Use: Never used  Substance and Sexual Activity   Alcohol use: No  Drug use: No   Sexual activity: Not Currently  Other Topics Concern   Not on file  Social History Narrative   Lives at home with her husband.   Right-handed.   2 cups caffeine/day.   Social Determinants of Health   Financial Resource Strain: Low Risk    Difficulty of Paying Living Expenses: Not hard at all  Food Insecurity: No Food Insecurity   Worried About Charity fundraiser in the Last Year: Never true   [place] in the Last Year: Never true  Transportation Needs: No Transportation Needs   Lack of Transportation (Medical): No   Lack of Transportation (Non-Medical): No  Physical Activity: Inactive   Days of Exercise per Week: 0 days   Minutes of Exercise per Session: 0 min  Stress: No Stress Concern Present   Feeling of Stress : Not at all  Social Connections: Socially Integrated   Frequency of Communication with Friends and Family: More than three times a week   Frequency of Social Gatherings with Friends and Family: More than three times a week   Attends Religious Services: More than 4 times per year   Active Member of [employer] or Organizations: Yes   Attends Archivist Meetings: 1 to 4 times per year    Marital Status: Married    Tobacco Counseling Counseling given: Not Answered   Clinical Intake:  Pre-visit preparation completed: Yes  Pain : No/denies pain     BMI - recorded: 26.76 Nutritional Status: BMI 25 -29 Overweight Nutritional Risks: None Diabetes: No  How often do you need to have someone help you when you read instructions, pamphlets, or other written materials from your doctor or pharmacy?: 1 - Never  Diabetic?No  Interpreter Needed?: No  Information entered by :: Charlott Rakes, LPN   Activities of Daily Living In your present state of health, do you have any difficulty performing the following activities: 09/17/2021  Hearing? Y  Comment wears hearing aids  Vision? N  Difficulty concentrating or making decisions? N  Walking or climbing stairs? N  Dressing or bathing? N  Doing errands, shopping? N  Preparing Food and eating ? N  Using the Toilet? N  In the past six months, have you accidently leaked urine? N  Do you have problems with loss of bowel control? N  Managing your Medications? N  Managing your Finances? N  Housekeeping or managing your Housekeeping? N  Some recent data might be hidden    Patient Care Team: [name], [place] as PCP - General (Physician Assistant) [name], MD as PCP - Cardiology (Cardiology) Bo Merino, MD as Consulting Physician (Rheumatology) Monna Fam, MD as Consulting Physician (Ophthalmology) Amil Amen, MD as Referring Physician (Psychiatry) Marcial Pacas, MD as Consulting Physician (Neurology) [name], MD (Inactive) as Consulting Physician (Cardiothoracic Surgery) Brantley Fling, MD as Consulting Physician (Oncology) Curt Bears, MD as Consulting Physician (Oncology) Edythe Clarity, [facility] (Pharmacist)  Indicate any recent Medical Services you may have received from other than [facility] providers in the past year (date may be approximate).     Assessment:   This is  a routine wellness examination for [place].  Hearing/Vision screen Hearing Screening - Comments:: Pt wears hearing aids Vision Screening - Comments:: Pt follows up annually with Dr Baldemar Lenis   Dietary issues and exercise activities discussed: Current Exercise Habits: The patient does not participate in regular exercise at present   Goals Addressed  This Visit's Progress    Patient Stated       Beat this cancer       Depression Screen PHQ 2/9 Scores 09/17/2021 02/20/2021 12/08/2018  PHQ - 2 Score 0 0 0  PHQ- 9 Score - - 1    Fall Risk Fall Risk  09/17/2021 02/27/2020 12/08/2018 10/30/2017  Falls in the past year? 0 0 0 No  Number falls in past yr: 0 0 0 -  Injury with Fall? 0 - 0 -  Risk for fall due to : Impaired vision;Impaired balance/gait - - -  Follow up Falls prevention discussed Falls evaluation completed - -    FALL RISK PREVENTION PERTAINING TO THE HOME:  Any stairs in or around the home? Yes  If so, are there any without handrails? No  Home free of loose throw rugs in walkways, pet beds, electrical cords, etc? Yes  Adequate lighting in your home to reduce risk of falls? Yes   ASSISTIVE DEVICES UTILIZED TO PREVENT FALLS:  Life alert? No  Use of a cane, walker or w/c? No  Grab bars in the bathroom? Yes  Shower chair or bench in shower? Yes  Elevated toilet seat or a handicapped toilet? No   TIMED UP AND GO:  Was the test performed? No .   Cognitive Function:     6CIT Screen 09/17/2021  What Year? 0 points  What month? 0 points  What time? 0 points  Count back from 20 0 points  Months in reverse 0 points  Repeat phrase 0 points  Total Score 0    Immunizations Immunization History  Administered Date(s) Administered   Fluad Quad(high Dose 65+) 07/28/2019, 07/02/2020, 06/26/2021   Influenza Split 08/10/2014   Influenza Whole 08/04/2017   Influenza-Unspecified 07/20/2014, 07/20/2018   PFIZER(Purple Top)SARS-COV-2 Vaccination  12/12/2019, 01/02/2020   Pneumococcal Conjugate-13 08/16/2015   Pneumococcal Polysaccharide-23 07/17/2020   Tdap 12/08/2018    TDAP status: Up to date  Flu Vaccine status: Up to date  Pneumococcal vaccine status: Up to date  Covid-19 vaccine status: Completed vaccines  Qualifies for Shingles Vaccine? Yes   Zostavax completed No   Shingrix Completed?: No.    Education has been provided regarding the importance of this vaccine. Patient has been advised to call insurance company to determine out of pocket expense if they have not yet received this vaccine. Advised may also receive vaccine at local pharmacy or Health Dept. Verbalized acceptance and understanding.  Screening Tests Health Maintenance  Topic Date Due   Zoster Vaccines- Shingrix (1 of 2) Never done   COVID-19 Vaccine (3 - Pfizer risk series) 01/30/2020   TETANUS/TDAP  12/08/2028   Pneumonia Vaccine 48+ Years old  Completed   INFLUENZA VACCINE  Completed   DEXA SCAN  Completed   Hepatitis C Screening  Completed   HPV VACCINES  Aged Out   COLONOSCOPY (Pts 45-57yr Insurance coverage will need to be confirmed)  Discontinued    Health Maintenance  Health Maintenance Due  Topic Date Due   Zoster Vaccines- Shingrix (1 of 2) Never done   COVID-19 Vaccine (3 - Pfizer risk series) 01/30/2020    Colorectal cancer screening: No longer required.   Mammogram status: Completed 12/16/18. Repeat every year  Bone Density status: Completed 02/29/20. Results reflect: Bone density results: OSTEOPENIA. Repeat every 2 years.  Additional Screening:  Hepatitis C Screening: Completed 12/08/18  Vision Screening: Recommended annual ophthalmology exams for early detection of glaucoma and other disorders of the eye. Is the  patient up to date with their annual eye exam?  Yes  Who is the provider or what is the name of the office in which the patient attends annual eye exams? Dr Baldemar Lenis If pt is not established with a provider,  would they like to be referred to a provider to establish care? No .   Dental Screening: Recommended annual dental exams for proper oral hygiene  Community Resource Referral / Chronic Care Management: CRR required this visit?  No   CCM required this visit?  No      Plan:     I have personally reviewed and noted the following in the patient's chart:   Medical and social history Use of alcohol, tobacco or illicit drugs  Current medications and supplements including opioid prescriptions. Patient is currently taking opioid prescriptions. Information provided to patient regarding non-opioid alternatives. Patient advised to discuss non-opioid treatment plan with their provider. Functional ability and status Nutritional status Physical activity Advanced directives List of other physicians Hospitalizations, surgeries, and ER visits in previous 12 months Vitals Screenings to include cognitive, depression, and falls Referrals and appointments  In addition, I have reviewed and discussed with patient certain preventive protocols, quality metrics, and best practice recommendations. A written personalized care plan for preventive services as well as general preventive health recommendations were provided to patient.     Willette Brace, LPN   [id number]   Nurse Notes: None

## 2021-09-17 NOTE — Patient Instructions (Signed)
Autumn Conley , Thank you for taking time to come for your Medicare Wellness Visit. I appreciate your ongoing commitment to your health goals. Please review the following plan we discussed and let me know if I can assist you in the future.   Screening recommendations/referrals: Colonoscopy: No longer required Mammogram: Done 12/16/18 repeat every year Bone Density: Done 02/29/20 repeat every 2 years Recommended yearly ophthalmology/optometry visit for glaucoma screening and checkup Recommended yearly dental visit for hygiene and checkup  Vaccinations: Influenza vaccine: Done 06/26/21 Pneumococcal vaccine: Up to date Tdap vaccine: Done 12/08/18 repeat every 10 years  Shingles vaccine: Shingrix discussed. Please contact your pharmacy for coverage information.    Covid-19:Completed 2/22 & 01/02/20  Advanced directives: Please bring a copy of your health care power of attorney and living will to the office at your convenience.  Conditions/risks identified: Beating this Cancer  Next appointment: Follow up in one year for your annual wellness visit    Preventive Care 65 Years and Older, Female Preventive care refers to lifestyle choices and visits with your health care provider that can promote health and wellness. What does preventive care include? A yearly physical exam. This is also called an annual well check. Dental exams once or twice a year. Routine eye exams. Ask your health care provider how often you should have your eyes checked. Personal lifestyle choices, including: Daily care of your teeth and gums. Regular physical activity. Eating a healthy diet. Avoiding tobacco and drug use. Limiting alcohol use. Practicing safe sex. Taking low-dose aspirin every day. Taking vitamin and mineral supplements as recommended by your health care provider. What happens during an annual well check? The services and screenings done by your health care provider during your annual well check will  depend on your age, overall health, lifestyle risk factors, and family history of disease. Counseling  Your health care provider may ask you questions about your: Alcohol use. Tobacco use. Drug use. Emotional well-being. Home and relationship well-being. Sexual activity. Eating habits. History of falls. Memory and ability to understand (cognition). Work and work Statistician. Reproductive health. Screening  You may have the following tests or measurements: Height, weight, and BMI. Blood pressure. Lipid and cholesterol levels. These may be checked every 5 years, or more frequently if you are over 54 years old. Skin check. Lung cancer screening. You may have this screening every year starting at age 8 if you have a 30-pack-year history of smoking and currently smoke or have quit within the past 15 years. Fecal occult blood test (FOBT) of the stool. You may have this test every year starting at age 74. Flexible sigmoidoscopy or colonoscopy. You may have a sigmoidoscopy every 5 years or a colonoscopy every 10 years starting at age 10. Hepatitis C blood test. Hepatitis B blood test. Sexually transmitted disease (STD) testing. Diabetes screening. This is done by checking your blood sugar (glucose) after you have not eaten for a while (fasting). You may have this done every 1-3 years. Bone density scan. This is done to screen for osteoporosis. You may have this done starting at age 19. Mammogram. This may be done every 1-2 years. Talk to your health care provider about how often you should have regular mammograms. Talk with your health care provider about your test results, treatment options, and if necessary, the need for more tests. Vaccines  Your health care provider may recommend certain vaccines, such as: Influenza vaccine. This is recommended every year. Tetanus, diphtheria, and acellular pertussis (Tdap, Td) vaccine.  You may need a Td booster every 10 years. Zoster vaccine. You may  need this after age 68. Pneumococcal 13-valent conjugate (PCV13) vaccine. One dose is recommended after age 24. Pneumococcal polysaccharide (PPSV23) vaccine. One dose is recommended after age 8. Talk to your health care provider about which screenings and vaccines you need and how often you need them. This information is not intended to replace advice given to you by your health care provider. Make sure you discuss any questions you have with your health care provider. Document Released: 11/02/2015 Document Revised: 06/25/2016 Document Reviewed: 08/07/2015 Elsevier Interactive Patient Education  2017 [place] Prevention in the Home Falls can cause injuries. They can happen to people of all ages. There are many things you can do to make your home safe and to help prevent falls. What can I do on the outside of my home? Regularly fix the edges of walkways and driveways and fix any cracks. Remove anything that might make you trip as you walk through a door, such as a raised step or threshold. Trim any bushes or trees on the path to your home. Use bright outdoor lighting. Clear any walking paths of anything that might make someone trip, such as rocks or tools. Regularly check to see if handrails are loose or broken. Make sure that both sides of any steps have handrails. Any raised decks and porches should have guardrails on the edges. Have any leaves, snow, or ice cleared regularly. Use sand or salt on walking paths during winter. Clean up any spills in your garage right away. This includes oil or grease spills. What can I do in the bathroom? Use night lights. Install grab bars by the toilet and in the tub and shower. Do not use towel bars as grab bars. Use non-skid mats or decals in the tub or shower. If you need to sit down in the shower, use a plastic, non-slip stool. Keep the floor dry. Clean up any water that spills on the floor as soon as it happens. Remove soap buildup in  the tub or shower regularly. Attach bath mats securely with double-sided non-slip rug tape. Do not have throw rugs and other things on the floor that can make you trip. What can I do in the bedroom? Use night lights. Make sure that you have a light by your bed that is easy to reach. Do not use any sheets or blankets that are too big for your bed. They should not hang down onto the floor. Have a firm chair that has side arms. You can use this for support while you get dressed. Do not have throw rugs and other things on the floor that can make you trip. What can I do in the kitchen? Clean up any spills right away. Avoid walking on wet floors. Keep items that you use a lot in easy-to-reach places. If you need to reach something above you, use a strong step stool that has a grab bar. Keep electrical cords out of the way. Do not use floor polish or wax that makes floors slippery. If you must use wax, use non-skid floor wax. Do not have throw rugs and other things on the floor that can make you trip. What can I do with my stairs? Do not leave any items on the stairs. Make sure that there are handrails on both sides of the stairs and use them. Fix handrails that are broken or loose. Make sure that handrails are as long as  the stairways. Check any carpeting to make sure that it is firmly attached to the stairs. Fix any carpet that is loose or worn. Avoid having throw rugs at the top or bottom of the stairs. If you do have throw rugs, attach them to the floor with carpet tape. Make sure that you have a light switch at the top of the stairs and the bottom of the stairs. If you do not have them, ask someone to add them for you. What else can I do to help prevent falls? Wear shoes that: Do not have high heels. Have rubber bottoms. Are comfortable and fit you well. Are closed at the toe. Do not wear sandals. If you use a stepladder: Make sure that it is fully opened. Do not climb a closed  stepladder. Make sure that both sides of the stepladder are locked into place. Ask someone to hold it for you, if possible. Clearly mark and make sure that you can see: Any grab bars or handrails. First and last steps. Where the edge of each step is. Use tools that help you move around (mobility aids) if they are needed. These include: Canes. Walkers. Scooters. Crutches. Turn on the lights when you go into a dark area. Replace any light bulbs as soon as they burn out. Set up your furniture so you have a clear path. Avoid moving your furniture around. If any of your floors are uneven, fix them. If there are any pets around you, be aware of where they are. Review your medicines with your doctor. Some medicines can make you feel dizzy. This can increase your chance of falling. Ask your doctor what other things that you can do to help prevent falls. This information is not intended to replace advice given to you by your health care provider. Make sure you discuss any questions you have with your health care provider. Document Released: 08/02/2009 Document Revised: 03/13/2016 Document Reviewed: 11/10/2014 Elsevier Interactive Patient Education  2017 [employer].

## 2021-09-18 DIAGNOSIS — I10 Essential (primary) hypertension: Secondary | ICD-10-CM

## 2021-09-18 DIAGNOSIS — E785 Hyperlipidemia, unspecified: Secondary | ICD-10-CM

## 2021-09-24 ENCOUNTER — Other Ambulatory Visit: Payer: Self-pay | Admitting: Physician Assistant

## 2021-10-01 DIAGNOSIS — D630 Anemia in neoplastic disease: Secondary | ICD-10-CM | POA: Diagnosis not present

## 2021-10-01 DIAGNOSIS — Z95828 Presence of other vascular implants and grafts: Secondary | ICD-10-CM | POA: Diagnosis not present

## 2021-10-01 DIAGNOSIS — D4989 Neoplasm of unspecified behavior of other specified sites: Secondary | ICD-10-CM | POA: Diagnosis not present

## 2021-10-02 DIAGNOSIS — H04122 Dry eye syndrome of left lacrimal gland: Secondary | ICD-10-CM | POA: Diagnosis not present

## 2021-10-02 DIAGNOSIS — H04123 Dry eye syndrome of bilateral lacrimal glands: Secondary | ICD-10-CM | POA: Diagnosis not present

## 2021-10-02 DIAGNOSIS — H04121 Dry eye syndrome of right lacrimal gland: Secondary | ICD-10-CM | POA: Diagnosis not present

## 2021-10-02 DIAGNOSIS — U071 COVID-19: Secondary | ICD-10-CM | POA: Diagnosis not present

## 2021-10-13 ENCOUNTER — Other Ambulatory Visit: Payer: Self-pay | Admitting: Physician Assistant

## 2021-10-16 ENCOUNTER — Other Ambulatory Visit: Payer: Self-pay | Admitting: Physician Assistant

## 2021-10-17 ENCOUNTER — Other Ambulatory Visit: Payer: Self-pay | Admitting: Physician Assistant

## 2021-10-22 DIAGNOSIS — H109 Unspecified conjunctivitis: Secondary | ICD-10-CM | POA: Diagnosis not present

## 2021-10-22 DIAGNOSIS — R079 Chest pain, unspecified: Secondary | ICD-10-CM | POA: Diagnosis not present

## 2021-10-22 DIAGNOSIS — R5381 Other malaise: Secondary | ICD-10-CM | POA: Diagnosis not present

## 2021-10-22 DIAGNOSIS — D4989 Neoplasm of unspecified behavior of other specified sites: Secondary | ICD-10-CM | POA: Diagnosis not present

## 2021-10-22 DIAGNOSIS — R109 Unspecified abdominal pain: Secondary | ICD-10-CM | POA: Diagnosis not present

## 2021-10-22 DIAGNOSIS — R0602 Shortness of breath: Secondary | ICD-10-CM | POA: Diagnosis not present

## 2021-10-22 DIAGNOSIS — R531 Weakness: Secondary | ICD-10-CM | POA: Diagnosis not present

## 2021-11-01 ENCOUNTER — Other Ambulatory Visit: Payer: Self-pay | Admitting: Physician Assistant

## 2021-11-01 DIAGNOSIS — H04123 Dry eye syndrome of bilateral lacrimal glands: Secondary | ICD-10-CM | POA: Diagnosis not present

## 2021-11-12 DIAGNOSIS — Z95828 Presence of other vascular implants and grafts: Secondary | ICD-10-CM | POA: Diagnosis not present

## 2021-11-12 DIAGNOSIS — C781 Secondary malignant neoplasm of mediastinum: Secondary | ICD-10-CM | POA: Diagnosis not present

## 2021-11-12 DIAGNOSIS — H109 Unspecified conjunctivitis: Secondary | ICD-10-CM | POA: Diagnosis not present

## 2021-11-12 DIAGNOSIS — D4989 Neoplasm of unspecified behavior of other specified sites: Secondary | ICD-10-CM | POA: Diagnosis not present

## 2021-11-12 DIAGNOSIS — C37 Malignant neoplasm of thymus: Secondary | ICD-10-CM | POA: Diagnosis not present

## 2021-11-26 DIAGNOSIS — Z1152 Encounter for screening for COVID-19: Secondary | ICD-10-CM | POA: Diagnosis not present

## 2021-11-26 DIAGNOSIS — Z20828 Contact with and (suspected) exposure to other viral communicable diseases: Secondary | ICD-10-CM | POA: Diagnosis not present

## 2021-12-02 ENCOUNTER — Telehealth: Payer: Self-pay

## 2021-12-02 ENCOUNTER — Telehealth: Payer: Self-pay | Admitting: Internal Medicine

## 2021-12-02 DIAGNOSIS — Z20822 Contact with and (suspected) exposure to covid-19: Secondary | ICD-10-CM | POA: Diagnosis not present

## 2021-12-02 NOTE — Telephone Encounter (Signed)
Left message x 2 for name of dentist and fax#

## 2021-12-02 NOTE — Telephone Encounter (Signed)
° °  Patient Name: Autumn Conley  DOB: 1945/12/11 MRN: [id number]  Primary Cardiologist: [name], MD  Chart reviewed as part of pre-operative protocol coverage.   Dental cleanings are considered low risk procedures per guidelines and generally do not require any specific cardiac clearance. It is also generally accepted that for simple extractions and dental cleanings, there is no need to interrupt blood thinner therapy if the patient is taking this. SBE prophylaxis is not required for the patient from a cardiac standpoint.  I did not see a phone number listed below so I looked up dentist office online. Called dental office but got a voicemail that says "lunch - general office." LMTCB. I did speak with patient who states they rescheduled her OV until they hear back from [place]. I relayed these recommendations to her as well. Will route to callback team to try dentist office again shortly. I do not have a fax number to route to either. Otherwise will remove from pre-op pool.  Charlie Pitter, PA-C 12/02/2021, 1:30 PM

## 2021-12-02 NOTE — Telephone Encounter (Signed)
° °  Pre-operative Risk Assessment    Patient Name: Autumn Conley  DOB: 1946-01-17 MRN: [id number]     Request for Surgical Clearance    Procedure:  Dental Cleaning  2. When is this surgery scheduled? Press F2 to enter date below and place date in Reason for Visit (see directions below). :1} Date of Surgery:  Clearance 12/02/21                                 Surgeon:   Surgeon's Group or Practice Name:  [facility] Phone number:   Fax number:     Type of Clearance Requested:   - Pharmacy:  Does patient need pre-medicated   Type of Anesthesia:  none  6. Are there any other requests or questions from the surgeon?    :1}  Additional requests/questions:  Dentist office needs to know if the patient needs to be pre-medicated. Patient is at the dentist office now   Signed, Johnna Acosta   12/02/2021, 12:48 PM

## 2021-12-02 NOTE — Telephone Encounter (Signed)
[facility] called and wondering if patient needs to premed before she comes into the office. Please advise. Can reach them back at [phone],

## 2021-12-02 NOTE — Telephone Encounter (Signed)
Left a message for DDS office to call our office back, as we need the fax# as well as the name of the DDS doing the dental work.

## 2021-12-02 NOTE — Telephone Encounter (Signed)
Spoke to pt told her received  a call from her Dentist about premed prior to visit. Asked pt if she normally takes something? Pt said they already contacted her Cardiologist due to heart surgery and she does not need anything. Told her okay just wanted to make sure it was taken care of. Pt verbalized understanding.

## 2021-12-03 DIAGNOSIS — C781 Secondary malignant neoplasm of mediastinum: Secondary | ICD-10-CM | POA: Diagnosis not present

## 2021-12-03 DIAGNOSIS — C37 Malignant neoplasm of thymus: Secondary | ICD-10-CM | POA: Diagnosis not present

## 2021-12-03 DIAGNOSIS — H109 Unspecified conjunctivitis: Secondary | ICD-10-CM | POA: Diagnosis not present

## 2021-12-03 DIAGNOSIS — Z95828 Presence of other vascular implants and grafts: Secondary | ICD-10-CM | POA: Diagnosis not present

## 2021-12-03 DIAGNOSIS — J479 Bronchiectasis, uncomplicated: Secondary | ICD-10-CM | POA: Diagnosis not present

## 2021-12-03 DIAGNOSIS — D4989 Neoplasm of unspecified behavior of other specified sites: Secondary | ICD-10-CM | POA: Diagnosis not present

## 2021-12-03 NOTE — Telephone Encounter (Signed)
Dentist: Dr. Payton Spark  Fax: [phone]

## 2021-12-03 NOTE — Telephone Encounter (Signed)
Dental office called back with needed information. I will fax clearance notes to dental office to fax # provided today and to Dr. Payton Spark, DDS.

## 2021-12-10 ENCOUNTER — Other Ambulatory Visit: Payer: Self-pay | Admitting: Physician Assistant

## 2021-12-17 ENCOUNTER — Telehealth: Payer: Self-pay | Admitting: Pharmacist

## 2021-12-17 NOTE — Progress Notes (Signed)
Chronic Care Management Pharmacy Assistant   Name: Autumn Conley  MRN: [id number] DOB: November 15, 1945   Reason for Encounter: Disease State - General Adherence Call     Recent office visits:  09/17/21 Annual Medicare Wellness Completed   Recent consult visits:  12/03/21 Autumn Sark, MD - Hematology/Oncology - Thymoma - No changes. Follow up as scheduled.   11/12/21 Autumn Sark, MD - Hematology/Oncology - Thymoma - No notes available.   10/22/21 Autumn Sark, MD - Hematology/Oncology - Thymoma - No notes available.   10/02/21 Autumn Hooker, MD - Ophthalmology - Dry Eye Syndrome - No notes available.   10/01/21 Autumn Sark, MD - Hematology/Oncology - Thymoma - No notes available.  09/10/21 Autumn Sark, MD - Hematology/Oncology - Thymoma - No notes available.   Hospital visits:  None in previous 6 months  Medications: Outpatient Encounter Medications as of 12/17/2021  Medication Sig   ALPRAZolam (XANAX) 0.25 MG tablet Take 0.5 tablets (0.125 mg total) by mouth daily as needed for anxiety.   Ascorbic Acid (VITAMIN C) 1000 MG tablet Take 1,000 mg by mouth daily.   atorvastatin (LIPITOR) 40 MG tablet TAKE 1 TABLET BY MOUTH AT  BEDTIME   AZO-CRANBERRY PO Take 1 tablet by mouth 2 (two) times daily.   B-D 3CC LUER-LOK SYR 25GX1" 25G X 1" 3 ML MISC USE TO INJECT  CYANOCOBALAMIN ONCE MONTHLY   butalbital-acetaminophen-caffeine (FIORICET) 50-325-40 MG tablet TAKE 1 TO 2 TABLETS BY  MOUTH EVERY 6 HOURS AS  NEEDED FOR HEADACHE(S) .  MANUFACTURER RECOMMENDS NOT EXCEEDING 6 TABLETS/DAY   Cholecalciferol (VITAMIN D-3 PO) Take 1 capsule by mouth daily.   cyanocobalamin (,VITAMIN B-12,) 1000 MCG/ML injection INJECT INTRAMUSCULARLY 1ML  EVERY 30 DAYS (DISCARD 28  DAYS AFTER FIRST USE)   dexamethasone (DECADRON) 4 MG tablet Take 4 mg by mouth daily as needed.   docusate sodium (COLACE) 100 MG capsule Take 100 mg by mouth 2 (two) times daily as needed for mild constipation or moderate  constipation.   folic acid (FOLVITE) 1 MG tablet Take 1 mg by mouth daily.   irbesartan (AVAPRO) 150 MG tablet TAKE 1 TABLET BY MOUTH  DAILY   ondansetron (ZOFRAN) 8 MG tablet Take 8 mg by mouth every 8 (eight) hours as needed for nausea or vomiting.   oxyCODONE (OXY IR/ROXICODONE) 5 MG immediate release tablet Take 1 tablet (5 mg total) by mouth every 6 (six) hours as needed for moderate pain. (Patient not taking: Reported on 09/17/2021)   pantoprazole (PROTONIX) 40 MG tablet Take 1 tablet (40 mg total) by mouth daily before breakfast.   PARoxetine (PAXIL) 20 MG tablet TAKE 3 TABLETS BY MOUTH IN  THE MORNING   polyethylene glycol powder (GLYCOLAX/MIRALAX) 17 GM/SCOOP powder Take 17 g by mouth daily as needed for mild constipation.   SUMAtriptan (IMITREX) 100 MG tablet Take 1 tablet (100 mg total) by mouth 2 (two) times daily as needed. At least 2 hrs between doses as needed bid   triamcinolone (KENALOG) 0.025 % cream Apply topically.   valACYclovir (VALTREX) 1000 MG tablet TAKE 2 TABLETS BY MOUTH DAILY FOR 5 DAYS FOR OUTBREAKS, THEN FOR FUTURE OUTBREAKS TAKE 2 TABS EVERY 12 HOURS FOR 1 DAY   vitamin E 180 MG (400 UNITS) capsule Take 400 Units by mouth daily.   No facility-administered encounter medications on file as of 12/17/2021.    Have you had any problems recently with your health? Patient denied any recent issues with her health other than a sore  in her nose that she is planning on making an appointment with [name] this week just to have it checked out.   Have you had any problems with your pharmacy? Patient denied any issues or problems with her current pharmacy.   What issues or side effects are you having with your medications? Patient denied any issues or side effects with her current medications.   What would you like me to pass along to [name], CPP for them to help you with?  Patient did not have anything to pass along to CPP at this time. She reported she was  doing ok currently. She plans to follow up in the next month or so to get her lipids rechecked.   What can we do to take care of you better? Patient did not have any recommendations at this time. She reported she was current satisfied with her current level of care.   Care Gaps  AWV: done 09/17/21 Colonoscopy: one 05/03/20 DM Eye Exam: N/A DM Foot Exam: N/A Microalbumin: N/A HbgAIC: N/A DEXA: done 02/29/20 Mammogram: done 12/16/18  Star Rating Drugs: atorvastatin (LIPITOR) 40 MG tablet - last filled 10/09/21 90 days  irbesartan (AVAPRO) 150 MG tablet - last filled 10/09/21 90 days    Future Appointments  Date Time Provider [place]  01/20/2022 10:35 AM [facility] E[facility] 5 [id number]  03/10/2022  3:30 PM [facility] CCM PHARMACIST [facility]  09/30/2022  9:30 AM [facility] HEALTH COACH [place], [place] Clinical Pharmacist Assistant  [phone]

## 2021-12-24 DIAGNOSIS — Z853 Personal history of malignant neoplasm of breast: Secondary | ICD-10-CM | POA: Diagnosis not present

## 2021-12-24 DIAGNOSIS — Z7952 Long term (current) use of systemic steroids: Secondary | ICD-10-CM | POA: Diagnosis not present

## 2021-12-24 DIAGNOSIS — Z7982 Long term (current) use of aspirin: Secondary | ICD-10-CM | POA: Diagnosis not present

## 2021-12-24 DIAGNOSIS — C37 Malignant neoplasm of thymus: Secondary | ICD-10-CM | POA: Diagnosis not present

## 2021-12-24 DIAGNOSIS — R5383 Other fatigue: Secondary | ICD-10-CM | POA: Diagnosis not present

## 2021-12-24 DIAGNOSIS — D4989 Neoplasm of unspecified behavior of other specified sites: Secondary | ICD-10-CM | POA: Diagnosis not present

## 2021-12-24 DIAGNOSIS — Z79899 Other long term (current) drug therapy: Secondary | ICD-10-CM | POA: Diagnosis not present

## 2021-12-24 DIAGNOSIS — R0789 Other chest pain: Secondary | ICD-10-CM | POA: Diagnosis not present

## 2021-12-30 DIAGNOSIS — Z20822 Contact with and (suspected) exposure to covid-19: Secondary | ICD-10-CM | POA: Diagnosis not present

## 2022-01-14 DIAGNOSIS — M7989 Other specified soft tissue disorders: Secondary | ICD-10-CM | POA: Diagnosis not present

## 2022-01-14 DIAGNOSIS — G8929 Other chronic pain: Secondary | ICD-10-CM | POA: Diagnosis not present

## 2022-01-14 DIAGNOSIS — Z853 Personal history of malignant neoplasm of breast: Secondary | ICD-10-CM | POA: Diagnosis not present

## 2022-01-14 DIAGNOSIS — R0789 Other chest pain: Secondary | ICD-10-CM | POA: Diagnosis not present

## 2022-01-14 DIAGNOSIS — D4989 Neoplasm of unspecified behavior of other specified sites: Secondary | ICD-10-CM | POA: Diagnosis not present

## 2022-01-14 DIAGNOSIS — R0609 Other forms of dyspnea: Secondary | ICD-10-CM | POA: Diagnosis not present

## 2022-01-14 DIAGNOSIS — R5383 Other fatigue: Secondary | ICD-10-CM | POA: Diagnosis not present

## 2022-01-14 DIAGNOSIS — C37 Malignant neoplasm of thymus: Secondary | ICD-10-CM | POA: Diagnosis not present

## 2022-01-20 ENCOUNTER — Ambulatory Visit (HOSPITAL_COMMUNITY): Payer: Medicare Other | Attending: Cardiology

## 2022-01-20 DIAGNOSIS — Z9889 Other specified postprocedural states: Secondary | ICD-10-CM | POA: Diagnosis not present

## 2022-01-20 DIAGNOSIS — I350 Nonrheumatic aortic (valve) stenosis: Secondary | ICD-10-CM | POA: Insufficient documentation

## 2022-01-20 LAB — ECHOCARDIOGRAM COMPLETE
AR max vel: 1.77 cm2
AV Area VTI: 1.63 cm2
AV Area mean vel: 1.71 cm2
AV Mean grad: 16 mmHg
AV Peak grad: 27.9 mmHg
Ao pk vel: 2.64 m/s
Area-P 1/2: 3.84 cm2
P 1/2 time: 619 msec
S' Lateral: 1.6 cm

## 2022-01-22 DIAGNOSIS — G4733 Obstructive sleep apnea (adult) (pediatric): Secondary | ICD-10-CM | POA: Diagnosis not present

## 2022-01-27 ENCOUNTER — Encounter: Payer: Self-pay | Admitting: Physician Assistant

## 2022-01-27 ENCOUNTER — Ambulatory Visit (INDEPENDENT_AMBULATORY_CARE_PROVIDER_SITE_OTHER): Payer: Medicare Other | Admitting: Physician Assistant

## 2022-01-27 VITALS — BP 124/80 | HR 99 | Temp 97.7°F | Ht 63.0 in | Wt 164.4 lb

## 2022-01-27 DIAGNOSIS — R040 Epistaxis: Secondary | ICD-10-CM | POA: Diagnosis not present

## 2022-01-27 DIAGNOSIS — B001 Herpesviral vesicular dermatitis: Secondary | ICD-10-CM | POA: Diagnosis not present

## 2022-01-27 DIAGNOSIS — M25471 Effusion, right ankle: Secondary | ICD-10-CM | POA: Diagnosis not present

## 2022-01-27 DIAGNOSIS — M79605 Pain in left leg: Secondary | ICD-10-CM

## 2022-01-27 DIAGNOSIS — M79604 Pain in right leg: Secondary | ICD-10-CM | POA: Diagnosis not present

## 2022-01-27 MED ORDER — MUPIROCIN 2 % EX OINT: TOPICAL_OINTMENT | CUTANEOUS | 0 refills | Status: AC

## 2022-01-27 MED ORDER — VALACYCLOVIR HCL 500 MG PO TABS: 500.0000 mg | ORAL_TABLET | Freq: Every day | ORAL | 1 refills | Status: DC

## 2022-01-27 NOTE — Progress Notes (Signed)
Autumn Conley is a 76 y.o. female here for bilateral leg pain ? ?History of Present Illness:  ? ?Chief Complaint  ?Patient presents with  ? Leg Pain  ?  Pt c/o bilateral leg pain, started a month ago and getting worse.  ? Nose Problem  ?  Pt c/o left nostril is bloody when she blows her nose, has been going on the past few weeks. Denies pain  ? ? ?Bilateral Leg Pain ?Autumn Conley presents with c/o bilateral leg pain that has been onset for a month and worsening over time. Pt describes pain as a type of sensitivity and tender sensation when something comes in contact with her legs. She also reports that upon getting up in the morning, she does experience a stinging sensation in her feet that resolves upon taking a few steps. Following further discussion, pt admits she does experience slight LE swelling at the end of the day but this resolves upon waking in the morning. After further evaluation, she was found to have right ankle swelling that is slightly tender. Pt is unaware as to why this hasn't resolved. At this time she is currently completing chemotherapy treatment with Dr. Mindi Junker, hematology.  ? ?Denies recent injury/trauma, hx of blood clots, numbness/tingling, or redness.  ? ? ?Nasal Dryness  ?In addition to her bilateral leg swelling, she has also been experiencing bleeding form her left nostril upon blowing her nose. States that this has been occurring daily for the past couple of weeks. Reports that the blood is bright red and is accompanied by some dried blood clots occasionally. At this time she does have a humidifier in her home that she uses daily as well as her CPAP machine. Pt does express that this could be a unhealed cold sore. She has history of cold sores and feels as though she would like to trial daily suppressive therapy as she is having recurrent outbreaks. ? ? ?Past Medical History:  ?Diagnosis Date  ? Anemia   ? Anxiety   ? Breast cancer ([place])   ? Depression   ? Dyspnea   ? Fibromyalgia   ? "some; not  chronic" (11/29/2014)  ? GERD (gastroesophageal reflux disease)   ? Glaucoma of both eyes   ? Headache   ? Hypertension   ? IBS (irritable bowel syndrome)   ? Mitral valve prolapse   ? NASH (nonalcoholic steatohepatitis)   ? Osteoarthritis   ? PONV (postoperative nausea and vomiting)   ? Stroke [facility])   ? 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([place])   ? ?  ?Social History  ? ?Tobacco Use  ? Smoking status: Never  ? Smokeless tobacco: Never  ?Vaping Use  ? Vaping Use: Never used  ?Substance Use Topics  ? Alcohol use: No  ? Drug use: No  ? ? ?Past Surgical History:  ?Procedure Laterality Date  ? ABDOMINAL HYSTERECTOMY  1980  ? APPENDECTOMY  1953  ? BIOPSY  05/03/2020  ? Procedure: BIOPSY;  Surgeon: Gatha Mayer, MD;  Location: [facility] ENDOSCOPY;  Service: Endoscopy;;  ? BREAST BIOPSY Left   ? BREAST LUMPECTOMY Left   ? CATARACT EXTRACTION, BILATERAL  2018  ? COLONOSCOPY WITH PROPOFOL N/A 05/03/2020  ? Procedure: COLONOSCOPY WITH PROPOFOL;  Surgeon: Gatha Mayer, MD;  Location: [facility] ENDOSCOPY;  Service: Endoscopy;  Laterality: N/A;  ? ESOPHAGOGASTRODUODENOSCOPY (EGD) WITH PROPOFOL N/A 05/03/2020  ? Procedure: ESOPHAGOGASTRODUODENOSCOPY (EGD) WITH PROPOFOL;  Surgeon: Gatha Mayer, MD;  Location: [facility] ENDOSCOPY;  Service: Endoscopy;  Laterality: N/A;  ? INTERCOSTAL  NERVE BLOCK Right 04/19/2021  ? Procedure: INTERCOSTAL NERVE BLOCK;  Surgeon: Lajuana Matte, MD;  Location: [place];  Service: Thoracic;  Laterality: Right;  ? MASTECTOMY Left ~ 2009  ? PARASTERNAL EXPLORATION Right 02/14/2019  ? Procedure: PARASTERNAL MEDIAL EXPLORATION WITH BIOPIES.;  Surgeon: [name], MD;  Location: [facility] OR;  Service: Thoracic;  Laterality: Right;  ? POLYPECTOMY  05/03/2020  ? Procedure: POLYPECTOMY;  Surgeon: Gatha Mayer, MD;  Location: [facility] ENDOSCOPY;  Service: Endoscopy;;  ? ? ?Family History  ?Problem Relation Age of Onset  ? Lung cancer Mother 57  ? Prostate cancer Brother 58  ? Bladder Cancer Neg Hx   ? Kidney cancer Neg Hx   ? ? ?Allergies  ?Allergen  Reactions  ? Latex Rash and Other (See Comments)  ?  Blisters, also  ? ? ?Current Medications:  ? ?Current Outpatient Medications:  ?  acetaminophen (TYLENOL) 500 MG tablet, Take by mouth., Disp: , Rfl:  ?  ALPRAZolam (XANAX) 0.25 MG tablet, Take 0.5 tablets (0.125 mg total) by mouth daily as needed for anxiety., Disp: 30 tablet, Rfl: 0 ?  Ascorbic Acid (VITAMIN C) 1000 MG tablet, Take 1,000 mg by mouth daily., Disp: , Rfl:  ?  atorvastatin (LIPITOR) 40 MG tablet, TAKE 1 TABLET BY MOUTH AT  BEDTIME, Disp: 90 tablet, Rfl: 0 ?  AZO-CRANBERRY PO, Take 1 tablet by mouth 2 (two) times daily., Disp: , Rfl:  ?  B-D 3CC LUER-LOK SYR 25GX1" 25G X 1" 3 ML MISC, USE TO INJECT  CYANOCOBALAMIN ONCE MONTHLY, Disp: 3 each, Rfl: 0 ?  butalbital-acetaminophen-caffeine (FIORICET) 50-325-40 MG tablet, TAKE 1 TO 2 TABLETS BY  MOUTH EVERY 6 HOURS AS  NEEDED FOR HEADACHE(S) .  MANUFACTURER RECOMMENDS NOT EXCEEDING 6 TABLETS/DAY, Disp: 20 tablet, Rfl: 1 ?  Cholecalciferol (VITAMIN D-3 PO), Take 1 capsule by mouth daily., Disp: , Rfl:  ?  cyanocobalamin (,VITAMIN B-12,) 1000 MCG/ML injection, INJECT INTRAMUSCULARLY 1ML  EVERY 30 DAYS (DISCARD 28  DAYS AFTER FIRST USE), Disp: 3 mL, Rfl: 0 ?  dexamethasone (DECADRON) 1 MG tablet, Take 1 mg by mouth once., Disp: , Rfl:  ?  docusate sodium (COLACE) 100 MG capsule, Take 100 mg by mouth 2 (two) times daily as needed for mild constipation or moderate constipation., Disp: , Rfl:  ?  folic acid (FOLVITE) 1 MG tablet, Take 1 mg by mouth daily., Disp: , Rfl:  ?  HYDROcodone-acetaminophen (NORCO) 5-325 MG tablet, Take 1 tablet by mouth every 6 (six) hours as needed for moderate pain., Disp: , Rfl:  ?  irbesartan (AVAPRO) 150 MG tablet, TAKE 1 TABLET BY MOUTH  DAILY, Disp: 90 tablet, Rfl: 3 ?  ondansetron (ZOFRAN) 8 MG tablet, Take 8 mg by mouth every 8 (eight) hours as needed for nausea or vomiting., Disp: , Rfl:  ?  oxyCODONE (OXY IR/ROXICODONE) 5 MG immediate release tablet, Take 1 tablet (5 mg  total) by mouth every 6 (six) hours as needed for moderate pain., Disp: 28 tablet, Rfl: 0 ?  pantoprazole (PROTONIX) 40 MG tablet, Take 1 tablet (40 mg total) by mouth daily before breakfast., Disp: 30 tablet, Rfl: 0 ?  PARoxetine (PAXIL) 20 MG tablet, TAKE 3 TABLETS BY MOUTH IN  THE MORNING, Disp: 270 tablet, Rfl: 1 ?  polyethylene glycol powder (GLYCOLAX/MIRALAX) 17 GM/SCOOP powder, Take 17 g by mouth daily as needed for mild constipation., Disp: , Rfl:  ?  SUMAtriptan (IMITREX) 100 MG tablet, Take 1 tablet (100 mg total)  by mouth 2 (two) times daily as needed. At least 2 hrs between doses as needed bid, Disp: 30 tablet, Rfl: 1 ?  valACYclovir (VALTREX) 1000 MG tablet, TAKE 2 TABLETS BY MOUTH DAILY FOR 5 DAYS FOR OUTBREAKS, THEN FOR FUTURE OUTBREAKS TAKE 2 TABS EVERY 12 HOURS FOR 1 DAY, Disp: 180 tablet, Rfl: 1 ?  vitamin E 180 MG (400 UNITS) capsule, Take 400 Units by mouth daily., Disp: , Rfl:   ? ?Review of Systems:  ? ?ROS ?Negative unless otherwise specified per HPI. ?Vitals:  ? ?Vitals:  ? 01/27/22 1153  ?BP: 124/80  ?Pulse: 99  ?Temp: 97.7 ?F (36.5 ?C)  ?TempSrc: Temporal  ?SpO2: 97%  ?Weight: 164 lb 6.1 oz (74.6 kg)  ?Height: 5' 3"  (1.6 m)  ?   ?Body mass index is 29.12 kg/m?. ? ?Physical Exam:  ? ?Physical Exam ?Vitals and nursing note reviewed.  ?Constitutional:   ?   General: She is not in acute distress. ?   Appearance: She is well-developed. She is not ill-appearing or toxic-appearing.  ?HENT:  ?   Nose:  ?   Right Nostril: No epistaxis.  ?   Left Nostril: Epistaxis present.  ?Cardiovascular:  ?   Rate and Rhythm: Normal rate and regular rhythm.  ?   Pulses: Normal pulses.  ?   Heart sounds: Normal heart sounds, S1 normal and S2 normal.  ?Pulmonary:  ?   Effort: Pulmonary effort is normal.  ?   Breath sounds: Normal breath sounds.  ?Musculoskeletal:  ?   Right ankle: Swelling present. Tenderness present.  ?   Comments: Bilateral calves circumference 34 cm; no swelling/tenderness appreciated in  calves ? ?Significant swelling with some ecchymosis to R medial ankle; no significant TTP ? ?Mild erythema to bilateral anterior shin  ?Skin: ?   General: Skin is warm and dry.  ?Neurological:  ?   Mental Statu

## 2022-01-27 NOTE — Patient Instructions (Signed)
It was great to see you! ? ?I am putting in an order for a stat ultrasound of your legs to see if you have any blood clots. ? ?For your nose, trial topical bactroban to nose 1-2 times daily ?Start 500 mg valtrex daily to keep your cold sores under better control ? ?I will be in touch with results of your ultrasound and will advise further at that time. ? ?Take care, ? ?[name] PA-C  ?

## 2022-01-28 ENCOUNTER — Ambulatory Visit (HOSPITAL_COMMUNITY)
Admission: RE | Admit: 2022-01-28 | Discharge: 2022-01-28 | Disposition: A | Discharge status: Home / Self Care | Payer: Medicare Other | Source: Ambulatory Visit | Attending: Physician Assistant | Admitting: Physician Assistant

## 2022-01-28 ENCOUNTER — Telehealth: Payer: Self-pay | Admitting: Physician Assistant

## 2022-01-28 DIAGNOSIS — M79605 Pain in left leg: Secondary | ICD-10-CM | POA: Diagnosis not present

## 2022-01-28 DIAGNOSIS — M25471 Effusion, right ankle: Secondary | ICD-10-CM | POA: Diagnosis not present

## 2022-01-28 DIAGNOSIS — M79604 Pain in right leg: Secondary | ICD-10-CM

## 2022-01-28 NOTE — Telephone Encounter (Signed)
FYI, see message. 

## 2022-01-28 NOTE — Telephone Encounter (Signed)
[facility] called with preliminary report:  ? ?Negative for DBT Bilaterally.  ? ?If you have any questions  tech can be reached at [phone].  ?

## 2022-02-03 ENCOUNTER — Telehealth: Payer: Self-pay | Admitting: Physician Assistant

## 2022-02-03 NOTE — Telephone Encounter (Signed)
Called and spoke with pt's spouse. He is stating she can not go to ED or come in to our office for an appt. He is stating we should just call something in since she was already seen last week. I stated this is not what she was seen for and medication cannot be prescribed until pt is seen to find out the problem. I asked if he has spoken with her oncologist to see if this maybe a reaction to her Chemo. But he just hung up on me.  ?

## 2022-02-03 NOTE — Telephone Encounter (Signed)
Pt's spouse states pt has been vomiting and has diarrhea. He stated she can not get up out of bed. He was transferred to triage and advised to go to ED or call EMS now. Pt and spouse refused.  ?

## 2022-02-04 DIAGNOSIS — Z95828 Presence of other vascular implants and grafts: Secondary | ICD-10-CM | POA: Diagnosis not present

## 2022-02-04 DIAGNOSIS — Z20822 Contact with and (suspected) exposure to covid-19: Secondary | ICD-10-CM | POA: Diagnosis not present

## 2022-02-04 DIAGNOSIS — Z853 Personal history of malignant neoplasm of breast: Secondary | ICD-10-CM | POA: Diagnosis not present

## 2022-02-04 DIAGNOSIS — K529 Noninfective gastroenteritis and colitis, unspecified: Secondary | ICD-10-CM | POA: Diagnosis not present

## 2022-02-04 DIAGNOSIS — C37 Malignant neoplasm of thymus: Secondary | ICD-10-CM | POA: Diagnosis not present

## 2022-02-04 DIAGNOSIS — R197 Diarrhea, unspecified: Secondary | ICD-10-CM | POA: Diagnosis not present

## 2022-02-04 DIAGNOSIS — R112 Nausea with vomiting, unspecified: Secondary | ICD-10-CM | POA: Diagnosis not present

## 2022-02-04 NOTE — Telephone Encounter (Signed)
Spoke with patient this morning and going to oncologist today for fluids and medication was sent in for patient by that provider. Patient states she is ok at this time and will let us know if anything changes. ?

## 2022-02-04 NOTE — Telephone Encounter (Signed)
TRIAGE NOTES FROM 02/03/22 at 4:39PM  ? ? ?patient ?Name: ?Autumn ELLI ?Conley ?Gender: Female ?DOB: 1946-03-16 ?Age: 76 Y 5 M 13 D ?Return ?Phone ?Number: ?[id number] ?(Primary), ?[id number] ?(Secondary) ?Address: ?City/ ?State/ ?Zip: [place] ? [address] ?Client [facility] - ?Client ?Presenter, [facility] at [facility] ?Provider [name]- PA ?Contact Type Call ?Who Is Calling Patient / Member / Family / Caregiver ?Call Type Triage / Clinical ?Caller Name Salvatore Poe ?Relationship To Patient Spouse ?Return Phone Number [phone] (Primary) ?Chief Complaint Vomiting ?Reason for Call Symptomatic / Request for Health Information ?Initial Comment Caller states his wife is having several days of ?loose bowel movements and vomiting on Friday ?evening, the 14th, then better over the weekend, ?but today it was worse again. ?Translation No ?Nurse Assessment ?Nurse: Redmond Pulling, RN, Levada Dy Date/Time Eilene Ghazi Time): 02/03/2022 4:27:36 PM ?Confirm and document reason for call. If ?symptomatic, describe symptoms. ?---Caller states his wife is having several days of loose ?bowel movements and vomiting on Friday evening, the ?14th, vomited x 2. Vomited 6 x since Friday night. No ?fever. ?Does the patient have any new or worsening ?symptoms? ---Yes ?Will a triage be completed? ---Yes ?Related visit to physician within the last 2 weeks? ---No ?Does the PT have any chronic conditions? (i.e. ?diabetes, asthma, this includes High risk factors for ?pregnancy, etc.) ?---Yes ?List chronic conditions. ---Lung Cancer, takes chemo ?Is this a behavioral health or substance abuse call? ---No ?Guidelines ?Guideline Title Affirmed Question Affirmed Notes Nurse Date/Time (Eastern ?Time) ?Cancer - Nausea and ?Vomiting ?[1] Neutropenia ?known or suspected ?(e.g., recent cancer ?Redmond Pulling, RN, Levada Dy 02/03/2022 4:30:22 ?PM ?PLEASE NOTE: All timestamps contained within this report are represented as  [place] Standard Time. ?CONFIDENTIALTY NOTICE: This fax transmission is intended only for the addressee. It contains information that is legally privileged, confidential or ?otherwise protected from use or disclosure. If you are not the intended recipient, you are strictly prohibited from reviewing, disclosing, copying using ?or disseminating any of this information or taking any action in reliance on or regarding this information. If you have received this fax in error, please ?notify us immediately by telephone so that we can arrange for its return to [place]. Phone: [phone], Toll-Free: [phone], Fax: [phone] ?Page: 2 of 2 ?Call Id: [id number] ?Guidelines ?Guideline Title Affirmed Question Affirmed Notes Nurse Date/Time (Eastern ?Time) ?chemotherapy) AND ?[2] vomiting ?Disp. Time (Eastern ?Time) Disposition Final User ?02/03/2022 4:39:05 PM Go to ED Now Yes Redmond Pulling, RN, Levada Dy ?Caller Disagree/Comply Disagree ?Caller Understands Yes ?PreDisposition InappropriateToAsk ?Care Advice Given Per Guideline ?GO TO ED NOW: [facility] refuses ER for patient by car or by ambulance. States he wants medication called in for her nausea and ?diarrhes, states "she is deathly sick". Called office per directives due to refusal for ER, message left. * You need to be seen in the ?Emergency Department. ?Referrals ?GO TO FACILITY REFUSED ?

## 2022-02-04 NOTE — Telephone Encounter (Signed)
Pt is feeling better this morning, Oncologist called in 2 prescriptions for patient and going to get fluids today. Pt will let us know if needing anything further but ok at this time.  ?

## 2022-02-07 ENCOUNTER — Ambulatory Visit (INDEPENDENT_AMBULATORY_CARE_PROVIDER_SITE_OTHER): Payer: Medicare Other | Admitting: Internal Medicine

## 2022-02-07 ENCOUNTER — Encounter: Payer: Self-pay | Admitting: Internal Medicine

## 2022-02-07 VITALS — BP 120/80 | HR 96 | Ht 63.0 in | Wt 158.6 lb

## 2022-02-07 DIAGNOSIS — Z9889 Other specified postprocedural states: Secondary | ICD-10-CM

## 2022-02-07 DIAGNOSIS — R0600 Dyspnea, unspecified: Secondary | ICD-10-CM

## 2022-02-07 DIAGNOSIS — I350 Nonrheumatic aortic (valve) stenosis: Secondary | ICD-10-CM

## 2022-02-07 DIAGNOSIS — D4989 Neoplasm of unspecified behavior of other specified sites: Secondary | ICD-10-CM | POA: Diagnosis not present

## 2022-02-07 DIAGNOSIS — I3139 Other pericardial effusion (noninflammatory): Secondary | ICD-10-CM | POA: Diagnosis not present

## 2022-02-07 DIAGNOSIS — I10 Essential (primary) hypertension: Secondary | ICD-10-CM | POA: Diagnosis not present

## 2022-02-07 DIAGNOSIS — R002 Palpitations: Secondary | ICD-10-CM | POA: Diagnosis not present

## 2022-02-07 MED ORDER — METOPROLOL TARTRATE 25 MG PO TABS: 12.5000 mg | ORAL_TABLET | Freq: Two times a day (BID) | ORAL | 3 refills | Status: DC

## 2022-02-07 NOTE — Progress Notes (Signed)
?Cardiology Office Note:   ? ?Date:  02/07/2022  ? ?ID:  Autumn Conley Reason, DOB 12/28/1945, MRN [id number] ? ?PCP:  [name], PA  ?Cardiologist:  [name], MD  ?Electrophysiologist:  None  ? ?Referring MD: [name], PA  ? ?Chief Complaint/Reason for Referral: ?Follow up pericardial effusion s/p pericardial window ? ?History of Present Illness:   ? ?Autumn Conley is a 76 y.o. female with a history of metastatic thyoma being followed by Oncology, Hx of Pagets disease of breast, HTN, HLD, anxiety, IBS, NASH, fibromyalgia who presents for follow up of generalized weakness, dizziness, shortness of breath with exertion. Symptomatic anemia related to malignancy, treated with iron supplementation with IV iron.  Status post pericardial window July 2022 for symptomatic pericardial effusion. She has been found to have recurrent disease on surveillance imaging related to metastatic thymoma and is undergoing chemotherapy intravenously with Dr. Mindi Junker [facility].  She is following closely with oncology. ? ?She is status post pericardial window in July 2022, and since that time her shortness of breath has essentially resolved. Echocardiogram findings from June and July 2022 showed mild aortic valve stenosis and no recurrence of the pericardial effusion as of July shortly after her procedure.   ? ?At her last visit she continued to have mild fatigue related to her oncologic illness. With no recurrence of shortness of breath symptoms, we discussed obtaining an echocardiogram in 6 months time primarily to follow the aortic valve stenosis and also surveillance of the pericardium for any constrictive features. She was also being treated for iron deficiency anemia, and received both folate and B12 supplementation during chemotherapy. ? ?She is accompanied by her daughter Autumn Conley, who is also a patient of mine. They both have appointments scheduled today, and wish to be seen together. ? ?She  endorses ongoing shortness of breath such as while walking. However, this is not nearly as severe as it was previously. She also becomes short winded with bending over and lifting heavy objects. Her daughter notes that her shortness of breath may also be exacerbated by anxiety. She has Xanax to take for anxiety as needed, but she does not need to take it very often. It does help. ? ?Since last week, she has been suffering from issues with her legs. She had significant edema in her right leg, mostly localized to her right ankle. She also noted extreme soreness, rash, and erythematous skin on both lower extremities. Lately her symptoms have improved significantly, including resolution of her LE pain. ? ?She does wear compression socks when she is able. ? ?Lately she obtained a new CPAP machine, which is helping her sleep throughout the night.  ? ?She denies any palpitations, or chest pain. No lightheadedness, headaches, syncope, orthopnea, or PND. ? ?We reviewed the results of her Echo 01/2022 at length. Severe MAC without MS.  ? ? ? ?Past Medical History:  ?Diagnosis Date  ? Anemia   ? Anxiety   ? Breast cancer ([place])   ? Depression   ? Dyspnea   ? Fibromyalgia   ? "some; not chronic" (11/29/2014)  ? GERD (gastroesophageal reflux disease)   ? Glaucoma of both eyes   ? Headache   ? Hypertension   ? IBS (irritable bowel syndrome)   ? Mitral valve prolapse   ? NASH (nonalcoholic steatohepatitis)   ? Osteoarthritis   ? PONV (postoperative nausea and vomiting)   ? Stroke [facility])   ? 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([place])   ? ? ?Past  Surgical History:  ?Procedure Laterality Date  ? ABDOMINAL HYSTERECTOMY  1980  ? APPENDECTOMY  1953  ? BIOPSY  05/03/2020  ? Procedure: BIOPSY;  Surgeon: Gatha Mayer, MD;  Location: [facility] ENDOSCOPY;  Service: Endoscopy;;  ? BREAST BIOPSY Left   ? BREAST LUMPECTOMY Left   ? CATARACT EXTRACTION, BILATERAL  2018  ? COLONOSCOPY WITH PROPOFOL N/A 05/03/2020  ? Procedure: COLONOSCOPY WITH PROPOFOL;  Surgeon: Gatha Mayer, MD;  Location: [facility] ENDOSCOPY;  Service: Endoscopy;  Laterality: N/A;  ? ESOPHAGOGASTRODUODENOSCOPY (EGD) WITH PROPOFOL N/A 05/03/2020  ? Procedure: ESOPHAGOGASTRODUODENOSCOPY (EGD) WITH PROPOFOL;  Surgeon: Gatha Mayer, MD;  Location: [facility] ENDOSCOPY;  Service: Endoscopy;  Laterality: N/A;  ? INTERCOSTAL NERVE BLOCK Right 04/19/2021  ? Procedure: INTERCOSTAL NERVE BLOCK;  Surgeon: Lajuana Matte, MD;  Location: [place];  Service: Thoracic;  Laterality: Right;  ? MASTECTOMY Left ~ 2009  ? PARASTERNAL EXPLORATION Right 02/14/2019  ? Procedure: PARASTERNAL MEDIAL EXPLORATION WITH BIOPIES.;  Surgeon: [name], MD;  Location: [facility] OR;  Service: Thoracic;  Laterality: Right;  ? POLYPECTOMY  05/03/2020  ? Procedure: POLYPECTOMY;  Surgeon: Gatha Mayer, MD;  Location: [facility] ENDOSCOPY;  Service: Endoscopy;;  ? ? ?Current Medications: ?Current Meds  ?Medication Sig  ? acetaminophen (TYLENOL) 500 MG tablet Take by mouth.  ? ALPRAZolam (XANAX) 0.25 MG tablet Take 0.5 tablets (0.125 mg total) by mouth daily as needed for anxiety.  ? Ascorbic Acid (VITAMIN C) 1000 MG tablet Take 1,000 mg by mouth daily.  ? atorvastatin (LIPITOR) 40 MG tablet TAKE 1 TABLET BY MOUTH AT  BEDTIME  ? AZO-CRANBERRY PO Take 1 tablet by mouth 2 (two) times daily.  ? B-D 3CC LUER-LOK SYR 25GX1" 25G X 1" 3 ML MISC USE TO INJECT  CYANOCOBALAMIN ONCE MONTHLY  ? butalbital-acetaminophen-caffeine (FIORICET) 50-325-40 MG tablet TAKE 1 TO 2 TABLETS BY  MOUTH EVERY 6 HOURS AS  NEEDED FOR HEADACHE(S) .  MANUFACTURER RECOMMENDS NOT EXCEEDING 6 TABLETS/DAY  ? Cholecalciferol (VITAMIN D-3 PO) Take 1 capsule by mouth daily.  ? cyanocobalamin (,VITAMIN B-12,) 1000 MCG/ML injection INJECT INTRAMUSCULARLY 1ML  EVERY 30 DAYS (DISCARD 28  DAYS AFTER FIRST USE)  ? dexamethasone (DECADRON) 1 MG tablet Take 1 mg by mouth once.  ? docusate sodium (COLACE) 100 MG capsule Take 100 mg by mouth 2 (two) times daily as needed for mild constipation or moderate constipation.   ? folic acid (FOLVITE) 1 MG tablet Take 1 mg by mouth daily.  ? HYDROcodone-acetaminophen (NORCO/VICODIN) 5-325 MG tablet Take 1 tablet by mouth every 6 (six) hours as needed for moderate pain.  ? irbesartan (AVAPRO) 150 MG tablet TAKE 1 TABLET BY MOUTH  DAILY  ? lidocaine-prilocaine (EMLA) cream Apply 1 application. topically as needed.  ? metoprolol tartrate (LOPRESSOR) 25 MG tablet Take 0.5 tablets (12.5 mg total) by mouth 2 (two) times daily.  ? mupirocin ointment (BACTROBAN) 2 % Apply to nasal area 1-2 times daily  ? ondansetron (ZOFRAN) 8 MG tablet Take 8 mg by mouth every 8 (eight) hours as needed for nausea or vomiting.  ? oxyCODONE (OXY IR/ROXICODONE) 5 MG immediate release tablet Take 1 tablet (5 mg total) by mouth every 6 (six) hours as needed for moderate pain.  ? pantoprazole (PROTONIX) 40 MG tablet Take 1 tablet (40 mg total) by mouth daily before breakfast.  ? PARoxetine (PAXIL) 20 MG tablet TAKE 3 TABLETS BY MOUTH IN  THE MORNING  ? polyethylene glycol powder (GLYCOLAX/MIRALAX) 17 GM/SCOOP powder Take  17 g by mouth daily as needed for mild constipation.  ? SUMAtriptan (IMITREX) 100 MG tablet Take 1 tablet (100 mg total) by mouth 2 (two) times daily as needed. At least 2 hrs between doses as needed bid  ? valACYclovir (VALTREX) 500 MG tablet Take 1 tablet (500 mg total) by mouth daily.  ? vitamin E 180 MG (400 UNITS) capsule Take 400 Units by mouth daily.  ?  ? ?Allergies:   Latex  ? ?Social History  ? ?Tobacco Use  ? Smoking status: Never  ? Smokeless tobacco: Never  ?Vaping Use  ? Vaping Use: Never used  ?Substance Use Topics  ? Alcohol use: No  ? Drug use: No  ?  ? ?Family History: ?The patient's family history includes Lung cancer (age of onset: 55) in her mother; Prostate cancer (age of onset: 63) in her brother. There is no history of Bladder Cancer or Kidney cancer. ? ?ROS:   ?Please see the history of present illness.    ?(+) Shortness of breath ?All other systems reviewed and are  negative. ? ?EKGs/Labs/Other Studies Reviewed:   ? ?The following studies were reviewed today: ? ?Bilateral LE Venous Doppler 01/28/2022: ?Summary:  ?BILATERAL:  ?- No evidence of deep vein thrombosis seen in the lower

## 2022-02-07 NOTE — Patient Instructions (Signed)
Medication Instructions:  ?START: METOPROLOL TARTRATE 12.80m TWICE DAILY  ?*If you need a refill on your cardiac medications before your next appointment, please call your pharmacy* ? ?Follow-Up: ?[facility] our [facility].  As part of our continuing mission to provide you with exceptional heart care, we have created designated Provider Care Teams.  These Care Teams include your primary Cardiologist (physician) and Advanced Practice Providers (APPs -  Physician Assistants and Nurse Practitioners) who all work together to provide you with the care you need, when you need it. ? ?Your next appointment:   ?6 month(s) ? ?The format for your next appointment:   ?In Person ? ?Provider:   ?GElouise Munroe MD   ? ? ? ? ? ?  ?

## 2022-02-11 DIAGNOSIS — Z853 Personal history of malignant neoplasm of breast: Secondary | ICD-10-CM | POA: Diagnosis not present

## 2022-02-11 DIAGNOSIS — C37 Malignant neoplasm of thymus: Secondary | ICD-10-CM | POA: Diagnosis not present

## 2022-02-11 DIAGNOSIS — N644 Mastodynia: Secondary | ICD-10-CM | POA: Diagnosis not present

## 2022-02-11 DIAGNOSIS — Z95828 Presence of other vascular implants and grafts: Secondary | ICD-10-CM | POA: Diagnosis not present

## 2022-02-17 ENCOUNTER — Other Ambulatory Visit: Payer: Self-pay | Admitting: Physician Assistant

## 2022-02-18 DIAGNOSIS — Z20822 Contact with and (suspected) exposure to covid-19: Secondary | ICD-10-CM | POA: Diagnosis not present

## 2022-02-21 DIAGNOSIS — H04123 Dry eye syndrome of bilateral lacrimal glands: Secondary | ICD-10-CM | POA: Diagnosis not present

## 2022-02-26 NOTE — Progress Notes (Signed)
Chronic Care Management Pharmacy Note  03/11/2022 Name:  Autumn Conley MRN:  [id number] DOB:  November 08, 1945  Subjective: Autumn Conley is an 76 y.o. year old female who is a primary patient of [name], [place].  The CCM team was consulted for assistance with disease management and care coordination needs.    Engaged with patient by telephone for follow up visit in response to provider referral for pharmacy case management and/or care coordination services.   Consent to Services:  The patient was given the following information about Chronic Care Management services today, agreed to services, and gave verbal consent: 1. CCM service includes personalized support from designated clinical staff supervised by the primary care provider, including individualized plan of care and coordination with other care providers 2. 24/7 contact phone numbers for assistance for urgent and routine care needs. 3. Service will only be billed when office clinical staff spend 20 minutes or more in a month to coordinate care. 4. Only one practitioner may furnish and bill the service in a calendar month. 5.The patient may stop CCM services at any time (effective at the end of the month) by phone call to the office staff. 6. The patient will be responsible for cost sharing (co-pay) of up to 20% of the service fee (after annual deductible is met). Patient agreed to services and consent obtained.  Patient Care Team: [name], [place] as PCP - General (Physician Assistant) [name], MD as PCP - Cardiology (Cardiology) Bo Merino, MD as Consulting Physician (Rheumatology) Monna Fam, MD as Consulting Physician (Ophthalmology) Amil Amen, MD as Referring Physician (Psychiatry) Marcial Pacas, MD as Consulting Physician (Neurology) [name], MD (Inactive) as Consulting Physician (Cardiothoracic Surgery) Brantley Fling, MD as Consulting Physician (Oncology) Curt Bears, MD as  Consulting Physician (Oncology) Edythe Clarity, [facility] (Pharmacist)  Recent office visits:  02/13/21- [name], PA- seen for urinary frequency, started colace 100 mg twice daily ,Use miralax 1 capful daily,labs done,  recommended follow up with urologist 02/05/21- [name], [name] B, FNP-seen for urinary frequency, labs ordered, short course bactrim, follow up with urologist  08/29/20- [name], PA- seen for anemia follow up, labs ordered, follow up 6 months    Recent consult visits:  01/29/21- [name], MD (Cardiology)-seen for palpitations, scheduled echocardiogram, follow up after echocardiogram 01/17/21- [name], MD( Pulmonology)- SOB follow up, recommended graded exercises as tolerated, follow up 4-6 weeks for breathing study 01/09/21- [name], MD (Urology)- seen for recurrent UTIs + stress incontinence, Start cranberry tablets, started estradiol 0.1 mg/gm, encouraged Kegel exercises for stress incontinence,follow up  6 months symptom check 01/08/21-[name], MD( Hematology)  IV Iron 01/03/21- [name], [facility])- seen for UTI sxs, labs performed, short course cefdinir 300 mg x7 DS, referral to urology  01/01/21-[name], MD( Hematology)  IV Iron 12/26/20- [name], MD ( Cardio)- scheduled echocardiogram, referral to pulmonology, follow up 6 weeks 12/25/20- [name], MD (Hematology)- seen for thymoma, imaging ordered, plan to give IV iron, ordered prescriptions benzonatate and pantoprazole, follow as scheduled 02/10/22[name], MD ( Cardio)- seen for palpitations, started metoprolol 12.61m BID, ordered 14- Telemetry monitor *Patient message*,ordered echocardiogram,  follow up  4-6 weeks 10/08/20- [name] MD( Pain Medicine)- initial consult, referral to pain management 09/25/20- PCharco PA-C(Hematology)- short course prednisone 20 mg  x5 DS, short course  levofloxacin 500 mg x7DS, referral to pain clinic, follow up 3 months  09/10/20-  [name], MD ( Cardio)- seen for Presyncope and palpitations, follow up 3 months   Objective:  Lab Results  Component Value Date   CREATININE 0.65 04/25/2021   CREATININE 0.64 04/24/2021   CREATININE 0.56 04/23/2021    Lab Results  Component Value Date   HGBA1C 6.1 (H) 11/30/2014   Last diabetic Eye exam: No results found for: HMDIABEYEEXA  Last diabetic Foot exam: No results found for: HMDIABFOOTEX      Component Value Date/Time   CHOL 181 02/27/2020 0932   TRIG 159.0 (H) 02/27/2020 0932   HDL 38.60 (L) 02/27/2020 0932   CHOLHDL 5 02/27/2020 0932   VLDL 31.8 02/27/2020 0932   LDLCALC 111 (H) 02/27/2020 0932       Latest Ref Rng & Units 04/21/2021    2:21 AM 08/29/2020    9:51 AM 07/02/2020   12:03 PM  Hepatic Function  Total Protein 6.5 - 8.1 g/dL 5.4   6.8   6.7    Albumin 3.5 - 5.0 g/dL 2.8      AST 15 - 41 U/L 19   27   72    ALT 0 - 44 U/L 24   24   55    Alk Phosphatase 38 - 126 U/L 92      Total Bilirubin 0.3 - 1.2 mg/dL 0.7   0.4   0.4      Lab Results  Component Value Date/Time   TSH 2.120 12/27/2020 02:29 PM   TSH 2.14 05/01/2020 02:05 PM   FREET4 0.70 12/08/2018 12:15 PM       Latest Ref Rng & Units 04/25/2021    5:00 AM 04/23/2021    4:25 AM 04/21/2021    2:21 AM  CBC  WBC 4.0 - 10.5 K/uL 5.5   4.9   8.6    Hemoglobin 12.0 - 15.0 g/dL 11.5   10.1   9.4    Hematocrit 36.0 - 46.0 % 34.1   31.2   28.3    Platelets 150 - 400 K/uL 290   240   250      Lab Results  Component Value Date/Time   VD25OH 36.03 02/27/2020 09:32 AM   VD25OH 38.37 07/28/2019 08:02 AM   Clinical ASCVD:  The 10-year ASCVD risk score (Arnett DK, et al., 2019) is: 28.6%   Values used to calculate the score:     Age: 89 years     Sex: Female     Is Non-Hispanic African American: No     Diabetic: No     Tobacco smoker: No     Systolic Blood Pressure: 076 mmHg     Is BP treated: Yes      HDL Cholesterol: 38.6 mg/dL     Total Cholesterol: 181 mg/dL    Other: (CHADS2VASc if Afib, PHQ9 if depression, MMRC or CAT for COPD, ACT, DEXA)  Social History   Tobacco Use  Smoking Status Never  Smokeless Tobacco Never   BP Readings from Last 3 Encounters:  02/07/22 120/80  01/27/22 124/80  08/19/21 130/78   Pulse Readings from Last 3 Encounters:  02/07/22 96  01/27/22 99  08/19/21 79   Wt Readings from Last 3 Encounters:  02/07/22 158 lb 9.6 oz (71.9 kg)  01/27/22 164 lb 6.1 oz (74.6 kg)  08/19/21 151 lb (68.5 kg)    Assessment: Review of patient past medical history, allergies, medications, health status, including review of consultants reports, laboratory and other test data, was performed as  part of comprehensive evaluation and provision of chronic care management services.   SDOH:  (Social Determinants of Health) assessments and interventions performed: Yes.    CCM Care Plan  Allergies  Allergen Reactions   Latex Rash and Other (See Comments)    Blisters, also    Medications Reviewed Today     Reviewed by Edythe Clarity, [facility] (Pharmacist) on 03/11/22 at [place] List Status: <None>   Medication Order Taking? Sig Documenting Provider Last Dose Status Informant  acetaminophen (TYLENOL) 500 MG tablet [id number] No Take by mouth. [provider] Taking Active   ALPRAZolam (XANAX) 0.25 MG tablet [id number] No Take 0.5 tablets (0.125 mg total) by mouth daily as needed for anxiety. [name], [place] Taking Active Self  Ascorbic Acid (VITAMIN C) 1000 MG tablet [id number] No Take 1,000 mg by mouth daily. [provider] Taking Active Self  atorvastatin (LIPITOR) 40 MG tablet [id number]  TAKE 1 TABLET BY MOUTH AT  BEDTIME [name], [place]  Active   AZO-CRANBERRY PO [id number] No Take 1 tablet by mouth 2 (two) times daily. [provider] Taking Active Self  B-D 3CC LUER-LOK SYR 25GX1" 25G X 1" 3 ML MISC [id number] No USE TO INJECT   CYANOCOBALAMIN ONCE Pat Patrick, [place], [place] Taking Active   butalbital-acetaminophen-caffeine (FIORICET) 50-325-40 MG tablet [id number]  TAKE 1 TO 2 TABLETS BY MOUTH EVERY 6 HOURS AS NEEDED FOR HEADACHES(S). MANUFACTURER RECOMMENDS NOT EXCEEDING 6 TABLETS PER DAY. [name], [place]  Active   Cholecalciferol (VITAMIN D-3 PO) [id number] No Take 1 capsule by mouth daily. [provider] Taking Active Self  cyanocobalamin (,VITAMIN B-12,) 1000 MCG/ML injection [id number] No INJECT INTRAMUSCULARLY 1ML  EVERY 30 DAYS (DISCARD 28  DAYS AFTER FIRST USE) [name], PA Taking Active   dexamethasone (DECADRON) 1 MG tablet [id number] No Take 1 mg by mouth once. [provider] Taking Active   docusate sodium (COLACE) 100 MG capsule [id number] No Take 100 mg by mouth 2 (two) times daily as needed for mild constipation or moderate constipation. [provider] Taking Active Self  folic acid (FOLVITE) 1 MG tablet [id number] No Take 1 mg by mouth daily. [provider] Taking Active   HYDROcodone-acetaminophen (NORCO/VICODIN) 5-325 MG tablet [id number] No Take 1 tablet by mouth every 6 (six) hours as needed for moderate pain. [provider] Taking Active   irbesartan (AVAPRO) 150 MG tablet [id number]  TAKE 1 TABLET BY MOUTH  DAILY Leamon Arnt, MD  Active   lidocaine-prilocaine (EMLA) cream [id number] No Apply 1 application. topically as needed. [provider] Taking Active   mupirocin ointment (BACTROBAN) 2 % [id number] No Apply to nasal area 1-2 times daily [place], [place], [place] Taking Active   ondansetron (ZOFRAN) 8 MG tablet [id number] No Take 8 mg by mouth every 8 (eight) hours as needed for nausea or vomiting. [provider] Taking Active Self  oxyCODONE (OXY IR/ROXICODONE) 5 MG immediate release tablet [id number] No Take 1 tablet (5 mg total) by mouth every 6 (six) hours as needed for moderate pain. Lars Pinks M, PA-C Taking Active    pantoprazole (PROTONIX) 40 MG tablet [id number] No Take 1 tablet (40 mg total) by mouth daily before breakfast. Raiford Noble [place], DO Taking Active Self  PARoxetine (PAXIL) 20 MG tablet [id number] No TAKE 3 TABLETS BY MOUTH IN  THE [place], [place], [place] Taking Active   polyethylene glycol powder (GLYCOLAX/MIRALAX) 17 GM/SCOOP powder [id number] No Take 17 g by mouth daily as needed  for mild constipation. [provider] Taking Active Self  SUMAtriptan (IMITREX) 100 MG tablet [id number] No Take 1 tablet (100 mg total) by mouth 2 (two) times daily as needed. At least 2 hrs between doses as needed bid Vivi Barrack, MD Taking Active Self  valACYclovir (VALTREX) 500 MG tablet [id number] No Take 1 tablet (500 mg total) by mouth daily. [name], [place] Taking Active   vitamin E 180 MG (400 UNITS) capsule [id number] No Take 400 Units by mouth daily. [provider] Taking Active Self            Patient Active Problem List   Diagnosis Date Noted   S/P lung surgery, follow-up exam    Acute on chronic diastolic CHF (congestive heart failure) ([place]) 04/18/2021   CHF (congestive heart failure) ([place]) 04/18/2021   Pericardial effusion 04/18/2021   Pleural effusion 04/18/2021   Recurrent UTI 06/28/2020   Inflammatory arthritis 05/16/2020   Erosive gastritis    Benign neoplasm of ascending colon    Heme + stool    Change in bowel habit    Anemia 05/01/2020   Dyspnea 05/01/2020   Generalized weakness 05/01/2020   UTI (urinary tract infection) 05/01/2020   Symptomatic anemia 05/01/2020   Glaucoma of both eyes    NASH (nonalcoholic steatohepatitis)    Mitral valve prolapse    Thymoma -- managed by [facility] - extensive documentation in Care Everywhere 03/22/2019   Breast cancer ([place]) 03/18/2019   Mass of upper lobe of right lung 01/20/2019   B12 deficiency 12/19/2018   Stress incontinence in female 12/19/2018   Dyslipidemia 12/14/2018   OSA (obstructive sleep apnea)  12/07/2018   History of breast cancer 12/07/2018   Cognitive complaints 11/25/2018   False positive stress test 01/29/2015   Migraine without aura and without status migrainosus, not intractable 12/18/2014   Small vessel disease, cerebrovascular 12/18/2014   GERD (gastroesophageal reflux disease) 12/02/2014   HTN (hypertension) 12/27/2013   Anxiety 12/27/2013   Depression 11/03/2012   Elevated LFTs 11/03/2012   Fibromyalgia 11/03/2012   IBS (irritable bowel syndrome) 11/03/2012   Lymphocytic colitis 11/03/2012   Paget's disease of nipple ([place]) 11/03/2012   Vitamin D deficiency 11/03/2012   Osteoarthritis 11/03/2012    Immunization History  Administered Date(s) Administered   Fluad Quad(high Dose 65+) 07/28/2019, 07/02/2020, 06/26/2021   Influenza Split 08/10/2014   Influenza Whole 08/04/2017   Influenza-Unspecified 07/20/2014, 07/20/2018   PFIZER(Purple Top)SARS-COV-2 Vaccination 12/12/2019, 01/02/2020   Pneumococcal Conjugate-13 08/16/2015   Pneumococcal Polysaccharide-23 07/17/2020   Tdap 12/08/2018   Conditions to be addressed/monitored: HLD HTN OSA GERD MDD Recurrent UTI Stress incontinence  Care Plan : [place]  Updates made by Edythe Clarity, RPH since 03/11/2022 12:00 AM     Problem: HLD HTN OSA GERD MDD Recurrent UTI Stress incontinence   Priority: High     Long-Range Goal: Disease Management   Start Date: 02/21/2021  Expected End Date: 02/21/2022  Recent Progress: On track  Priority: High  Note:     Current Barriers:  Could work towards LDL goal < 100 - strength/exercise tolerance now improving   Pharmacist Clinical Goal(s):  Patient will contact provider office for questions/concerns as evidenced notation of same in electronic health record through collaboration with PharmD and provider.   Interventions: 1:1 collaboration with [name], PA regarding development and update of comprehensive plan of care as evidenced by provider  attestation and co-signature Inter-disciplinary care team collaboration (see longitudinal plan of care) Comprehensive medication  review performed; medication list updated in electronic medical record No changes to medications   Hypertension (BP goal <130/80) -Controlled, consistently at goal at OVs -Current treatment: Irbesartan 150 mg once daily Appropriate, Effective, Safe, Accessible -No home monitoring, has home cuff if needed  -Current dietary habits: low fat diet. Current exercise habits: starting to increase exercise frequency - hgb had dropped to 6.6 04/2020, improved with iron infxns to 11.6 02/13/2021. Feeling much better now.  -Denies hypotensive/hypertensive symptoms -Counseled to monitor BP at home as directed, document, and provide log at future appointments -Recommended to continue current medication  Update 08/27/21 Hgb continues to improve - 12.0 at last lab checkup Not checking BP at home, however remains controlled at all of her MD visits Continue current medication  Monitor for s/sx of hypotension  Update 03/11/22 BP has been well controlled recently.  She is not really checking often at home but office BP has been good. She was trying metoprolol to see if this helped SOB, which it did not so she has since stopped taking her metoprolol. SOB is not as frequent as it once was. She denies swelling, wearing compression hose. No changes to meds - continue to monitor swelling and s/sx of hypertension.  Hyperlipidemia: (LDL goal < 100) -Not ideally controlled, last LDL 111, consistently close to goal (+/-) -10 yr ASCVD 16%, primary prevention  -Current treatment: Atorvastatin 40 mg once daily Appropriate, Query effective, ,  -Denies tolerability issues or concerns -Educated on Cholesterol goals; patient to start walking more as tolerated -Recommended to continue current medication  Update 08/27/21 Patient continues to be adherent to medication No cholesterol labs  done since 02/2020.  Reminded patient to schedule appointment with Sam ASAP.  She was also due for medicare AWV - scheduiled appt for later this month. Recheck lipids - as long as they remain stable we could continue current medication. Work on exercise as previous.  Update 03/11/22 Continues adherence, however no updated labs since last visit. Reminded patient to call and schedule appointment, she is agreeable. No changes to current medication regimen - continue to monitor lipids and adjust as needed.   GERD/errosive gastritis (Goal to minimize symptoms) -Controlled, no issues or need for trigger avoidance -Receives B12 injections at home - daughter provides them -Avoids NSAIDs, occasional use of APAP for OA pain.  -Current regimen Pantoprazole 40 mg once daily -Continue current management  Depression (Goal: minimize symptoms) -Controlled -Current treatment: Paroxetine 60 mg once daily -PHQ9: 0. Denies any worsening symptoms or side effects. Feels content with management -Educated on Benefits of medication for symptom control -Recommended to continue current medication  Osteopenia (Goal maintain bone density, ensure optimization of supplements) -Controlled -Last DEXA Scan: 02/2020 - osteopenia w/ plan to repeat in 2 yrs. FRAX score: 10 year major osteoporotic risk: 7.6%. 10 year hip fracture risk: 0.7%. -Long term SSRI and PPI use -Patient is not a candidate for pharmacologic treatment -Current treatment  Vitamin D3 1000 units daily  -Recommend weight-bearing and muscle strengthening exercises for building and maintaining bone density. -Recommended to continue current medication  Patient Goals/Self-Care Activities Patient will:  - take medications as prescribed target a minimum of 150 minutes of moderate intensity exercise weekly  Follow Up Plan: [place] f/u telephone visit 6-8 months Medication Assistance: None required.  Patient affirms current coverage meets  needs.  Patient's preferred pharmacy is:  [id number] [place], [id number]: [phone]: [phone] [place]  [place], [address] [place], [address], [place] [address] Phone: [phone] Fax: [phone]  Follow Up:  Patient agrees to Care Plan and Follow-up.  Plan: rph f/u telephone call 6 months          Future Appointments  Date Time Provider [place]  08/22/2022  3:20 PM [name], MD [facility]  09/30/2022  9:30 AM [facility] [place], PharmD Clinical Pharmacist  [facility] [phone]

## 2022-03-04 DIAGNOSIS — J90 Pleural effusion, not elsewhere classified: Secondary | ICD-10-CM | POA: Diagnosis not present

## 2022-03-04 DIAGNOSIS — R222 Localized swelling, mass and lump, trunk: Secondary | ICD-10-CM | POA: Diagnosis not present

## 2022-03-04 DIAGNOSIS — D4989 Neoplasm of unspecified behavior of other specified sites: Secondary | ICD-10-CM | POA: Diagnosis not present

## 2022-03-04 DIAGNOSIS — J302 Other seasonal allergic rhinitis: Secondary | ICD-10-CM | POA: Diagnosis not present

## 2022-03-04 DIAGNOSIS — Z95828 Presence of other vascular implants and grafts: Secondary | ICD-10-CM | POA: Diagnosis not present

## 2022-03-04 DIAGNOSIS — C50011 Malignant neoplasm of nipple and areola, right female breast: Secondary | ICD-10-CM | POA: Diagnosis not present

## 2022-03-04 DIAGNOSIS — Z853 Personal history of malignant neoplasm of breast: Secondary | ICD-10-CM | POA: Diagnosis not present

## 2022-03-04 DIAGNOSIS — N644 Mastodynia: Secondary | ICD-10-CM | POA: Diagnosis not present

## 2022-03-04 DIAGNOSIS — C37 Malignant neoplasm of thymus: Secondary | ICD-10-CM | POA: Diagnosis not present

## 2022-03-04 DIAGNOSIS — R0602 Shortness of breath: Secondary | ICD-10-CM | POA: Diagnosis not present

## 2022-03-05 ENCOUNTER — Other Ambulatory Visit: Payer: Self-pay | Admitting: Family Medicine

## 2022-03-05 ENCOUNTER — Other Ambulatory Visit: Payer: Self-pay | Admitting: Physician Assistant

## 2022-03-05 DIAGNOSIS — I10 Essential (primary) hypertension: Secondary | ICD-10-CM

## 2022-03-10 ENCOUNTER — Telehealth: Payer: Medicare Other

## 2022-03-11 ENCOUNTER — Ambulatory Visit: Payer: Medicare Other | Admitting: Pharmacist

## 2022-03-11 DIAGNOSIS — E785 Hyperlipidemia, unspecified: Secondary | ICD-10-CM

## 2022-03-11 DIAGNOSIS — I10 Essential (primary) hypertension: Secondary | ICD-10-CM

## 2022-03-11 NOTE — Patient Instructions (Addendum)
Visit Information   Goals Addressed             This Visit's Progress    LDL < 100   On track    Timeframe:  Long-Range Goal Priority:  High Start Date:  08/27/21                           Expected End Date: 02/24/22                    Follow Up Date 11/27/21   Work towards LDL < 100   Why is this important?   Reduce risk for stroke and heart attack.     Notes:        Patient Care Plan: CCM Pharmacy Care Plan     Problem Identified: HLD HTN OSA GERD MDD Recurrent UTI Stress incontinence   Priority: High     Long-Range Goal: Disease Management   Start Date: 02/21/2021  Expected End Date: 02/21/2022  Recent Progress: On track  Priority: High  Note:     Current Barriers:  Could work towards LDL goal < 100 - strength/exercise tolerance now improving   Pharmacist Clinical Goal(s):  Patient will contact provider office for questions/concerns as evidenced notation of same in electronic health record through collaboration with PharmD and provider.   Interventions: 1:1 collaboration with [name], PA regarding development and update of comprehensive plan of care as evidenced by provider attestation and co-signature Inter-disciplinary care team collaboration (see longitudinal plan of care) Comprehensive medication review performed; medication list updated in electronic medical record No changes to medications   Hypertension (BP goal <130/80) -Controlled, consistently at goal at OVs -Current treatment: Irbesartan 150 mg once daily Appropriate, Effective, Safe, Accessible -No home monitoring, has home cuff if needed  -Current dietary habits: low fat diet. Current exercise habits: starting to increase exercise frequency - hgb had dropped to 6.6 04/2020, improved with iron infxns to 11.6 02/13/2021. Feeling much better now.  -Denies hypotensive/hypertensive symptoms -Counseled to monitor BP at home as directed, document, and provide log at future  appointments -Recommended to continue current medication  Update 08/27/21 Hgb continues to improve - 12.0 at last lab checkup Not checking BP at home, however remains controlled at all of her MD visits Continue current medication  Monitor for s/sx of hypotension  Update 03/11/22 BP has been well controlled recently.  She is not really checking often at home but office BP has been good. She was trying metoprolol to see if this helped SOB, which it did not so she has since stopped taking her metoprolol. SOB is not as frequent as it once was. She denies swelling, wearing compression hose. No changes to meds - continue to monitor swelling and s/sx of hypertension.  Hyperlipidemia: (LDL goal < 100) -Not ideally controlled, last LDL 111, consistently close to goal (+/-) -10 yr ASCVD 16%, primary prevention  -Current treatment: Atorvastatin 40 mg once daily Appropriate, Query effective, ,  -Denies tolerability issues or concerns -Educated on Cholesterol goals; patient to start walking more as tolerated -Recommended to continue current medication  Update 08/27/21 Patient continues to be adherent to medication No cholesterol labs done since 02/2020.  Reminded patient to schedule appointment with Sam ASAP.  She was also due for medicare AWV - scheduiled appt for later this month. Recheck lipids - as long as they remain stable we could continue current medication. Work on exercise as previous.  Update 03/11/22  Continues adherence, however no updated labs since last visit. Reminded patient to call and schedule appointment, she is agreeable. No changes to current medication regimen - continue to monitor lipids and adjust as needed.   GERD/errosive gastritis (Goal to minimize symptoms) -Controlled, no issues or need for trigger avoidance -Receives B12 injections at home - daughter provides them -Avoids NSAIDs, occasional use of APAP for OA pain.  -Current regimen Pantoprazole 40 mg once  daily -Continue current management  Depression (Goal: minimize symptoms) -Controlled -Current treatment: Paroxetine 60 mg once daily -PHQ9: 0. Denies any worsening symptoms or side effects. Feels content with management -Educated on Benefits of medication for symptom control -Recommended to continue current medication  Osteopenia (Goal maintain bone density, ensure optimization of supplements) -Controlled -Last DEXA Scan: 02/2020 - osteopenia w/ plan to repeat in 2 yrs. FRAX score: 10 year major osteoporotic risk: 7.6%. 10 year hip fracture risk: 0.7%. -Long term SSRI and PPI use -Patient is not a candidate for pharmacologic treatment -Current treatment  Vitamin D3 1000 units daily  -Recommend weight-bearing and muscle strengthening exercises for building and maintaining bone density. -Recommended to continue current medication  Patient Goals/Self-Care Activities Patient will:  - take medications as prescribed target a minimum of 150 minutes of moderate intensity exercise weekly  Follow Up Plan: [place] f/u telephone visit 6-8 months Medication Assistance: None required.  Patient affirms current coverage meets needs.  Patient's preferred pharmacy [facility]:  [id number] [place], [id number]: [phone]: [address] [address] [place]Phone: [phone]: [phone] Follow Up:  Patient agrees to Care Plan and Follow-up.  Plan: rph f/u telephone call 6 months         The patient verbalized understanding of instructions, educational materials, and care plan provided today and DECLINED offer to receive copy of patient instructions, educational materials, and care plan.  Telephone follow up appointment with pharmacy team member scheduled for: 1 year  CEdythe Clarity [place]  PharmD Clinical Pharmacist  [facility]([phone]

## 2022-03-25 DIAGNOSIS — R0602 Shortness of breath: Secondary | ICD-10-CM | POA: Diagnosis not present

## 2022-03-25 DIAGNOSIS — C37 Malignant neoplasm of thymus: Secondary | ICD-10-CM | POA: Diagnosis not present

## 2022-03-25 DIAGNOSIS — Z95828 Presence of other vascular implants and grafts: Secondary | ICD-10-CM | POA: Diagnosis not present

## 2022-03-25 DIAGNOSIS — D4989 Neoplasm of unspecified behavior of other specified sites: Secondary | ICD-10-CM | POA: Diagnosis not present

## 2022-03-25 DIAGNOSIS — N644 Mastodynia: Secondary | ICD-10-CM | POA: Diagnosis not present

## 2022-03-25 DIAGNOSIS — Z853 Personal history of malignant neoplasm of breast: Secondary | ICD-10-CM | POA: Diagnosis not present

## 2022-03-25 DIAGNOSIS — C50011 Malignant neoplasm of nipple and areola, right female breast: Secondary | ICD-10-CM | POA: Diagnosis not present

## 2022-03-27 DIAGNOSIS — Z9221 Personal history of antineoplastic chemotherapy: Secondary | ICD-10-CM | POA: Diagnosis not present

## 2022-03-27 DIAGNOSIS — N644 Mastodynia: Secondary | ICD-10-CM | POA: Diagnosis not present

## 2022-03-27 DIAGNOSIS — E785 Hyperlipidemia, unspecified: Secondary | ICD-10-CM | POA: Diagnosis not present

## 2022-03-27 DIAGNOSIS — G4733 Obstructive sleep apnea (adult) (pediatric): Secondary | ICD-10-CM | POA: Diagnosis not present

## 2022-03-27 DIAGNOSIS — T451X5A Adverse effect of antineoplastic and immunosuppressive drugs, initial encounter: Secondary | ICD-10-CM | POA: Diagnosis not present

## 2022-03-27 DIAGNOSIS — F419 Anxiety disorder, unspecified: Secondary | ICD-10-CM | POA: Diagnosis not present

## 2022-03-27 DIAGNOSIS — Z9889 Other specified postprocedural states: Secondary | ICD-10-CM | POA: Diagnosis not present

## 2022-03-27 DIAGNOSIS — F068 Other specified mental disorders due to known physiological condition: Secondary | ICD-10-CM | POA: Diagnosis not present

## 2022-03-27 DIAGNOSIS — Z923 Personal history of irradiation: Secondary | ICD-10-CM | POA: Diagnosis not present

## 2022-03-27 DIAGNOSIS — Z853 Personal history of malignant neoplasm of breast: Secondary | ICD-10-CM | POA: Diagnosis not present

## 2022-03-27 DIAGNOSIS — M8580 Other specified disorders of bone density and structure, unspecified site: Secondary | ICD-10-CM | POA: Diagnosis not present

## 2022-03-27 DIAGNOSIS — I10 Essential (primary) hypertension: Secondary | ICD-10-CM | POA: Diagnosis not present

## 2022-03-27 DIAGNOSIS — D4989 Neoplasm of unspecified behavior of other specified sites: Secondary | ICD-10-CM | POA: Diagnosis not present

## 2022-04-01 DIAGNOSIS — H04123 Dry eye syndrome of bilateral lacrimal glands: Secondary | ICD-10-CM | POA: Diagnosis not present

## 2022-04-02 ENCOUNTER — Other Ambulatory Visit: Payer: Self-pay | Admitting: Physician Assistant

## 2022-04-03 ENCOUNTER — Other Ambulatory Visit: Payer: Self-pay | Admitting: Physician Assistant

## 2022-04-11 ENCOUNTER — Telehealth: Payer: Self-pay | Admitting: Pharmacist

## 2022-04-15 DIAGNOSIS — Z853 Personal history of malignant neoplasm of breast: Secondary | ICD-10-CM | POA: Diagnosis not present

## 2022-04-15 DIAGNOSIS — Z95828 Presence of other vascular implants and grafts: Secondary | ICD-10-CM | POA: Diagnosis not present

## 2022-04-15 DIAGNOSIS — D4989 Neoplasm of unspecified behavior of other specified sites: Secondary | ICD-10-CM | POA: Diagnosis not present

## 2022-04-15 DIAGNOSIS — J3489 Other specified disorders of nose and nasal sinuses: Secondary | ICD-10-CM | POA: Diagnosis not present

## 2022-04-15 DIAGNOSIS — R059 Cough, unspecified: Secondary | ICD-10-CM | POA: Diagnosis not present

## 2022-04-15 DIAGNOSIS — C50011 Malignant neoplasm of nipple and areola, right female breast: Secondary | ICD-10-CM | POA: Diagnosis not present

## 2022-04-15 DIAGNOSIS — N644 Mastodynia: Secondary | ICD-10-CM | POA: Diagnosis not present

## 2022-04-15 DIAGNOSIS — H05229 Edema of unspecified orbit: Secondary | ICD-10-CM | POA: Diagnosis not present

## 2022-04-15 DIAGNOSIS — C37 Malignant neoplasm of thymus: Secondary | ICD-10-CM | POA: Diagnosis not present

## 2022-05-06 DIAGNOSIS — D4989 Neoplasm of unspecified behavior of other specified sites: Secondary | ICD-10-CM | POA: Diagnosis not present

## 2022-05-06 DIAGNOSIS — Z95828 Presence of other vascular implants and grafts: Secondary | ICD-10-CM | POA: Diagnosis not present

## 2022-05-06 DIAGNOSIS — C37 Malignant neoplasm of thymus: Secondary | ICD-10-CM | POA: Diagnosis not present

## 2022-05-09 ENCOUNTER — Telehealth: Payer: Self-pay | Admitting: Pharmacist

## 2022-05-09 NOTE — Progress Notes (Signed)
Chronic Care Management Pharmacy Assistant   Name: Autumn Conley  MRN: [id number] DOB: 01-03-1946   Reason for Encounter: Hypertension Adherence Call    Recent office visits:  None  Recent consult visits:  None  Hospital visits:  None in previous 6 months  Medications: Outpatient Encounter Medications as of 05/09/2022  Medication Sig   acetaminophen (TYLENOL) 500 MG tablet Take by mouth.   ALPRAZolam (XANAX) 0.25 MG tablet Take 0.5 tablets (0.125 mg total) by mouth daily as needed for anxiety.   Ascorbic Acid (VITAMIN C) 1000 MG tablet Take 1,000 mg by mouth daily.   atorvastatin (LIPITOR) 40 MG tablet TAKE 1 TABLET BY MOUTH AT  BEDTIME   AZO-CRANBERRY PO Take 1 tablet by mouth 2 (two) times daily.   B-D 3CC LUER-LOK SYR 25GX1" 25G X 1" 3 ML MISC USE TO INJECT  CYANOCOBALAMIN ONCE MONTHLY   butalbital-acetaminophen-caffeine (FIORICET) 50-325-40 MG tablet TAKE 1 TO 2 TABLETS BY MOUTH EVERY 6 HOURS AS NEEDED FOR HEADACHES(S). MANUFACTURER RECOMMENDS NOT EXCEEDING 6 TABLETS PER DAY.   Cholecalciferol (VITAMIN D-3 PO) Take 1 capsule by mouth daily.   cyanocobalamin (,VITAMIN B-12,) 1000 MCG/ML injection INJECT INTRAMUSCULARLY 1ML  EVERY 30 DAYS (DISCARD 28  DAYS AFTER FIRST USE)   dexamethasone (DECADRON) 1 MG tablet Take 1 mg by mouth once.   docusate sodium (COLACE) 100 MG capsule Take 100 mg by mouth 2 (two) times daily as needed for mild constipation or moderate constipation.   folic acid (FOLVITE) 1 MG tablet Take 1 mg by mouth daily.   HYDROcodone-acetaminophen (NORCO/VICODIN) 5-325 MG tablet Take 1 tablet by mouth every 6 (six) hours as needed for moderate pain.   irbesartan (AVAPRO) 150 MG tablet TAKE 1 TABLET BY MOUTH  DAILY   lidocaine-prilocaine (EMLA) cream Apply 1 application. topically as needed.   mupirocin ointment (BACTROBAN) 2 % Apply to nasal area 1-2 times daily   ondansetron (ZOFRAN) 8 MG tablet Take 8 mg by mouth every 8 (eight) hours as needed for nausea or  vomiting.   oxyCODONE (OXY IR/ROXICODONE) 5 MG immediate release tablet Take 1 tablet (5 mg total) by mouth every 6 (six) hours as needed for moderate pain.   pantoprazole (PROTONIX) 40 MG tablet Take 1 tablet (40 mg total) by mouth daily before breakfast.   PARoxetine (PAXIL) 20 MG tablet TAKE 3 TABLETS BY MOUTH IN THE  MORNING   polyethylene glycol powder (GLYCOLAX/MIRALAX) 17 GM/SCOOP powder Take 17 g by mouth daily as needed for mild constipation.   SUMAtriptan (IMITREX) 100 MG tablet Take 1 tablet (100 mg total) by mouth 2 (two) times daily as needed. At least 2 hrs between doses as needed bid   valACYclovir (VALTREX) 500 MG tablet Take 1 tablet (500 mg total) by mouth daily.   vitamin E 180 MG (400 UNITS) capsule Take 400 Units by mouth daily.   No facility-administered encounter medications on file as of 05/09/2022.   Reviewed chart prior to disease state call. Spoke with patient regarding BP  Recent Office Vitals: BP Readings from Last 3 Encounters:  02/07/22 120/80  01/27/22 124/80  08/19/21 130/78   Pulse Readings from Last 3 Encounters:  02/07/22 96  01/27/22 99  08/19/21 79    Wt Readings from Last 3 Encounters:  02/07/22 158 lb 9.6 oz (71.9 kg)  01/27/22 164 lb 6.1 oz (74.6 kg)  08/19/21 151 lb (68.5 kg)     Kidney Function Lab Results  Component Value Date/Time   CREATININE 0.65  04/25/2021 05:00 AM   CREATININE 0.64 04/24/2021 05:50 AM   CREATININE 0.88 08/29/2020 09:51 AM   CREATININE 0.89 07/02/2020 12:03 PM   GFR 66.15 05/01/2020 02:05 PM   GFRNONAA >60 04/25/2021 05:00 AM   GFRNONAA >60 03/07/2019 10:34 AM   GFRAA >60 05/08/2020 04:17 PM   GFRAA >60 03/07/2019 10:34 AM       Latest Ref Rng & Units 04/25/2021    5:00 AM 04/24/2021    5:50 AM 04/23/2021    4:25 AM  BMP  Glucose 70 - 99 mg/dL 103  116  108   BUN 8 - 23 mg/dL 11  8  12    Creatinine 0.44 - 1.00 mg/dL 0.65  0.64  0.56   Sodium 135 - 145 mmol/L 136  137  137   Potassium 3.5 - 5.1 mmol/L 4.3   4.2  3.8   Chloride 98 - 111 mmol/L 98  100  102   CO2 22 - 32 mmol/L 31  32  29   Calcium 8.9 - 10.3 mg/dL 9.2  9.3  8.9     Current antihypertensive regimen:  Irbesartan 150 mg daily  How often are you checking your Blood Pressure?   Current home BP readings:   What recent interventions/DTPs have been made by any provider to improve Blood Pressure control since last CPP Visit:   Any recent hospitalizations or ED visits since last visit with CPP?   What diet changes have been made to improve Blood Pressure Control?    What exercise is being done to improve your Blood Pressure Control?    Adherence Review: Is the patient currently on ACE/ARB medication?  Does the patient have >5 day gap between last estimated fill dates?   **Unsuccessful attempt at reaching the patient to complete this call**   Care Gaps: Medicare Annual Wellness: Completed 09/17/2021 Hemoglobin A1C: 6.1% on 11/30/2014 Colonoscopy: Completed 05/03/2020 Dexa Scan: Completed Mammogram: Completed 12/16/2018  Future Appointments  Date Time Provider [place]  08/22/2022  3:20 PM [name], MD [facility]  09/30/2022  9:30 AM [facility] HEALTH COACH [facility]  03/16/2023  3:00 PM [facility] CCM PHARMACIST [facility]   Star Rating Drugs: Atorvastatin 40 mg last filled 03/25/2022 90 DS Irbesartan 150 mg last filled 03/25/2022 90 DS  April D Calhoun, [place] Pharmacist Assistant [phone]

## 2022-05-21 ENCOUNTER — Telehealth: Payer: Self-pay | Admitting: Physician Assistant

## 2022-05-21 DIAGNOSIS — R42 Dizziness and giddiness: Secondary | ICD-10-CM | POA: Diagnosis not present

## 2022-05-21 NOTE — Telephone Encounter (Signed)
Patient Name: Autumn Conley Gender: Female DOB: 14-Oct-1946 Age: 76 Y 22 M 29 D Return Phone Number: [phone] (Primary) Address: [place]  [address] Client [facility] Client Site [place] [facility] [place] Day Provider Morene Rankins, Tomas de Castro- PA Contact Type Call Who Is Calling Patient / Member / Family / Caregiver Call Type Triage / Clinical Relationship To Patient Self Return Phone Number [phone] (Primary) Chief Complaint Dizziness Reason for Call Symptomatic / Request for [place] states she has been having dizziness since last night. She has been unable to walk correctly and it has gotten worse this morning. It sounds like she is very tired when she is talking. This all started last night when she laid down.. It is like the room is spinning. When she tried to get up, she was still dizzy and her husband had to help her up out of the bed. Her blood pressure is 130/73. Her breathing is fine. No chest pain. Translation No Nurse Assessment Nurse: Roselie Awkward, RN, Heather Date/Time (Eastern Time): 05/21/2022 12:28:49 PM Confirm and document reason for call. If symptomatic, describe symptoms. ---caller states when she laid down last night the room started spinning. dizziness continues this morning. Needed help getting up out of bed this morning. BP 130/73. Does the patient have any new or worsening symptoms? ---Yes Will a triage be completed? ---Yes Related visit to physician within the last 2 weeks? ---No Does the PT have any chronic conditions? (i.e. diabetes, asthma, this includes High risk factors for pregnancy, etc.) ---Yes List chronic conditions. ---currently on chemo Is this a behavioral health or substance abuse call? ---No  Guidelines Guideline Title Affirmed Question Affirmed Notes Nurse Date/Time (Eastern Time) Dizziness - Vertigo SEVERE  dizziness (vertigo) (e.g., unable to walk without assistance) Roselie Awkward, RN, Nira Conn 05/21/2022 12:30:42 PM Disp. Time Eilene Ghazi Time) Disposition Final User 05/21/2022 12:34:53 PM Go to ED Now (or PCP triage) Yes Roselie Awkward, RN, Heather Final Disposition 05/21/2022 12:34:53 PM Go to ED Now (or PCP triage) Yes Roselie Awkward, RN, Soyla Murphy Disagree/Comply Comply Caller Understands Yes PreDisposition Call Doctor Care Advice Given Per Guideline GO TO ED NOW (OR PCP TRIAGE): CARE ADVICE given per Dizziness - Vertigo (Adult) guideline. NOTE TO TRIAGER - DRIVING: * Another adult should drive. * Patient should not delay going to the emergency department. * If immediate transportation is not available via car, rideshare (e.g., Lyft, Uber), or taxi, then the patient should be instructed to call [id number]. Referrals [facility] - ED

## 2022-05-21 NOTE — Telephone Encounter (Signed)
Patient has been dizzy since last night - was unable to wake up or get out of bed without husbands assistance- Patient is unable to take full steps without feeling steady. Patient sounds very tired and weak on the phone .   Patient has been triaged, awaiting notes.

## 2022-05-22 NOTE — Telephone Encounter (Signed)
FYI, see Triage note.

## 2022-05-27 DIAGNOSIS — Z95828 Presence of other vascular implants and grafts: Secondary | ICD-10-CM | POA: Diagnosis not present

## 2022-05-27 DIAGNOSIS — S2241XD Multiple fractures of ribs, right side, subsequent encounter for fracture with routine healing: Secondary | ICD-10-CM | POA: Diagnosis not present

## 2022-05-27 DIAGNOSIS — Z85230 Personal history of malignant carcinoid tumor of thymus: Secondary | ICD-10-CM | POA: Diagnosis not present

## 2022-05-27 DIAGNOSIS — R600 Localized edema: Secondary | ICD-10-CM | POA: Diagnosis not present

## 2022-05-27 DIAGNOSIS — D4989 Neoplasm of unspecified behavior of other specified sites: Secondary | ICD-10-CM | POA: Diagnosis not present

## 2022-05-27 DIAGNOSIS — C37 Malignant neoplasm of thymus: Secondary | ICD-10-CM | POA: Diagnosis not present

## 2022-05-27 DIAGNOSIS — D487 Neoplasm of uncertain behavior of other specified sites: Secondary | ICD-10-CM | POA: Diagnosis not present

## 2022-05-27 DIAGNOSIS — N644 Mastodynia: Secondary | ICD-10-CM | POA: Diagnosis not present

## 2022-05-27 DIAGNOSIS — R918 Other nonspecific abnormal finding of lung field: Secondary | ICD-10-CM | POA: Diagnosis not present

## 2022-05-27 DIAGNOSIS — Z853 Personal history of malignant neoplasm of breast: Secondary | ICD-10-CM | POA: Diagnosis not present

## 2022-06-17 DIAGNOSIS — C37 Malignant neoplasm of thymus: Secondary | ICD-10-CM | POA: Diagnosis not present

## 2022-06-17 DIAGNOSIS — D4989 Neoplasm of unspecified behavior of other specified sites: Secondary | ICD-10-CM | POA: Diagnosis not present

## 2022-06-17 DIAGNOSIS — Z95828 Presence of other vascular implants and grafts: Secondary | ICD-10-CM | POA: Diagnosis not present

## 2022-06-17 DIAGNOSIS — R2241 Localized swelling, mass and lump, right lower limb: Secondary | ICD-10-CM | POA: Diagnosis not present

## 2022-06-17 DIAGNOSIS — R918 Other nonspecific abnormal finding of lung field: Secondary | ICD-10-CM | POA: Diagnosis not present

## 2022-07-02 ENCOUNTER — Telehealth: Payer: Self-pay

## 2022-07-02 NOTE — Chronic Care Management (AMB) (Signed)
  Care Coordination   Note   07/02/2022 Name: Autumn Conley MRN: [id number] DOB: [birth year]  Autumn Conley is a 76 y.o. year old female who sees [name], [place] for primary care. I reached out to [name] Reason by phone today to offer care coordination services.  Ms. Andres was given information about Care Coordination services today including:   The Care Coordination services include support from the care team which includes your Nurse Coordinator, Clinical Social Worker, or Pharmacist.  The Care Coordination team is here to help remove barriers to the health concerns and goals most important to you. Care Coordination services are voluntary, and the patient may decline or stop services at any time by request to their care team member.   Care Coordination Consent Status: Patient did not agree to participate in care coordination services at this time.    Encounter Outcome:  Pt. Refused  Noreene Larsson, [place], [place] [address] Direct Dial: [phone] [email or web address]

## 2022-07-08 DIAGNOSIS — H40013 Open angle with borderline findings, low risk, bilateral: Secondary | ICD-10-CM | POA: Diagnosis not present

## 2022-07-08 DIAGNOSIS — Z5111 Encounter for antineoplastic chemotherapy: Secondary | ICD-10-CM | POA: Diagnosis not present

## 2022-07-08 DIAGNOSIS — C37 Malignant neoplasm of thymus: Secondary | ICD-10-CM | POA: Diagnosis not present

## 2022-07-08 DIAGNOSIS — Z95828 Presence of other vascular implants and grafts: Secondary | ICD-10-CM | POA: Diagnosis not present

## 2022-07-08 DIAGNOSIS — H524 Presbyopia: Secondary | ICD-10-CM | POA: Diagnosis not present

## 2022-07-08 DIAGNOSIS — H04123 Dry eye syndrome of bilateral lacrimal glands: Secondary | ICD-10-CM | POA: Diagnosis not present

## 2022-07-08 DIAGNOSIS — Z961 Presence of intraocular lens: Secondary | ICD-10-CM | POA: Diagnosis not present

## 2022-07-08 DIAGNOSIS — H35033 Hypertensive retinopathy, bilateral: Secondary | ICD-10-CM | POA: Diagnosis not present

## 2022-07-14 ENCOUNTER — Encounter: Payer: Self-pay | Admitting: *Deleted

## 2022-07-16 DIAGNOSIS — B029 Zoster without complications: Secondary | ICD-10-CM | POA: Diagnosis not present

## 2022-07-21 DIAGNOSIS — I872 Venous insufficiency (chronic) (peripheral): Secondary | ICD-10-CM | POA: Diagnosis not present

## 2022-07-21 DIAGNOSIS — M7989 Other specified soft tissue disorders: Secondary | ICD-10-CM | POA: Diagnosis not present

## 2022-07-21 DIAGNOSIS — M79661 Pain in right lower leg: Secondary | ICD-10-CM | POA: Diagnosis not present

## 2022-07-22 DIAGNOSIS — R600 Localized edema: Secondary | ICD-10-CM | POA: Diagnosis not present

## 2022-07-23 ENCOUNTER — Encounter: Payer: Self-pay | Admitting: Physician Assistant

## 2022-07-23 ENCOUNTER — Other Ambulatory Visit: Payer: Self-pay

## 2022-07-23 ENCOUNTER — Encounter: Payer: Self-pay | Admitting: Physical Therapy

## 2022-07-23 ENCOUNTER — Ambulatory Visit (INDEPENDENT_AMBULATORY_CARE_PROVIDER_SITE_OTHER): Payer: Medicare Other | Admitting: Physician Assistant

## 2022-07-23 ENCOUNTER — Ambulatory Visit (INDEPENDENT_AMBULATORY_CARE_PROVIDER_SITE_OTHER): Payer: Medicare Other | Admitting: Physical Therapy

## 2022-07-23 VITALS — BP 128/70 | HR 105 | Temp 97.8°F | Ht 63.0 in | Wt 168.5 lb

## 2022-07-23 DIAGNOSIS — R42 Dizziness and giddiness: Secondary | ICD-10-CM | POA: Diagnosis not present

## 2022-07-23 DIAGNOSIS — R2689 Other abnormalities of gait and mobility: Secondary | ICD-10-CM

## 2022-07-23 DIAGNOSIS — M7989 Other specified soft tissue disorders: Secondary | ICD-10-CM

## 2022-07-23 MED ORDER — FUROSEMIDE 20 MG PO TABS: 20.0000 mg | ORAL_TABLET | Freq: Every day | ORAL | 0 refills | Status: DC | PRN

## 2022-07-23 MED ORDER — MECLIZINE HCL 12.5 MG PO TABS: 12.5000 mg | ORAL_TABLET | Freq: Three times a day (TID) | ORAL | 0 refills | Status: DC | PRN

## 2022-07-23 NOTE — Patient Instructions (Signed)
It was great to see you!  For your PT appointment TODAY at 3:15p -- ARRIVE AT 3p [facility] [address], [place], [place] [address]  Call [phone] if you have questions about this appt  I will refer you to Vascular for your leg  Take the lasix as needed for your leg swelling  Take care,  [name] PA-C

## 2022-07-23 NOTE — Therapy (Signed)
OUTPATIENT PHYSICAL THERAPY VESTIBULAR EVALUATION     Patient Name: Autumn Conley MRN: [id number] DOB:[birth year], 76 y.o., female Today's Date: 07/23/2022  PCP: [name], PA REFERRING PROVIDER: [name], PA   PT End of Session - 07/23/22 1613     Visit Number 1    Number of Visits 4    Date for PT Re-Evaluation 09/03/22    Authorization Type MCR    PT Start Time 8101    PT Stop Time 1600    PT Time Calculation (min) 45 min    Activity Tolerance Patient tolerated treatment well    Behavior During Therapy WFL for tasks assessed/performed             Past Medical History:  Diagnosis Date   Anemia    Anxiety    Breast cancer ([place])    Depression    Dyspnea    Fibromyalgia    "some; not chronic" (11/29/2014)   GERD (gastroesophageal reflux disease)    Glaucoma of both eyes    Headache    Hypertension    IBS (irritable bowel syndrome)    Mitral valve prolapse    NASH (nonalcoholic steatohepatitis)    Osteoarthritis    PONV (postoperative nausea and vomiting)    Stroke ([place])    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[facility])    Past Surgical History:  Procedure Laterality Date   [place]   BIOPSY  05/03/2020   Procedure: BIOPSY;  Surgeon: Gatha Mayer, MD;  Location: [facility];  Service: Endoscopy;;   BREAST BIOPSY Left    BREAST LUMPECTOMY Left    CATARACT EXTRACTION, BILATERAL  2018   COLONOSCOPY WITH PROPOFOL N/A 05/03/2020   Procedure: COLONOSCOPY WITH PROPOFOL;  Surgeon: Gatha Mayer, MD;  Location: [facility];  Service: Endoscopy;  Laterality: N/A;   ESOPHAGOGASTRODUODENOSCOPY (EGD) WITH PROPOFOL N/A 05/03/2020   Procedure: ESOPHAGOGASTRODUODENOSCOPY (EGD) WITH PROPOFOL;  Surgeon: Gatha Mayer, MD;  Location: [facility];  Service: Endoscopy;  Laterality: N/A;   INTERCOSTAL NERVE BLOCK Right 04/19/2021   Procedure: INTERCOSTAL NERVE BLOCK;  Surgeon: Lajuana Matte, MD;  Location: [facility];  Service: Thoracic;   Laterality: Right;   MASTECTOMY Left ~ 2009   PARASTERNAL EXPLORATION Right 02/14/2019   Procedure: PARASTERNAL MEDIAL EXPLORATION WITH BIOPIES.;  Surgeon: [name], MD;  Location: [facility];  Service: Thoracic;  Laterality: Right;   POLYPECTOMY  05/03/2020   Procedure: POLYPECTOMY;  Surgeon: Gatha Mayer, MD;  Location: [facility] ENDOSCOPY;  Service: Endoscopy;;   Patient Active Problem List   Diagnosis Date Noted   S/P lung surgery, follow-up exam    Acute on chronic diastolic CHF (congestive heart failure) ([place]) 04/18/2021   CHF (congestive heart failure) ([place]) 04/18/2021   Pericardial effusion 04/18/2021   Pleural effusion 04/18/2021   Recurrent UTI 06/28/2020   Inflammatory arthritis 05/16/2020   Erosive gastritis    Benign neoplasm of ascending colon    Heme + stool    Change in bowel habit    Anemia 05/01/2020   Dyspnea 05/01/2020   Generalized weakness 05/01/2020   UTI (urinary tract infection) 05/01/2020   Symptomatic anemia 05/01/2020   Glaucoma of both eyes    NASH (nonalcoholic steatohepatitis)    Mitral valve prolapse    Thymoma -- managed by [facility] - extensive documentation in Care Everywhere 03/22/2019   Breast cancer ([place]) 03/18/2019   Mass of upper lobe of right lung 01/20/2019   B12 deficiency 12/19/2018  Stress incontinence in female 12/19/2018   Dyslipidemia 12/14/2018   OSA (obstructive sleep apnea) 12/07/2018   History of breast cancer 12/07/2018   Cognitive complaints 11/25/2018   False positive stress test 01/29/2015   Migraine without aura and without status migrainosus, not intractable 12/18/2014   Small vessel disease, cerebrovascular 12/18/2014   GERD (gastroesophageal reflux disease) 12/02/2014   HTN (hypertension) 12/27/2013   Anxiety 12/27/2013   Depression 11/03/2012   Elevated LFTs 11/03/2012   Fibromyalgia 11/03/2012   IBS (irritable bowel syndrome) 11/03/2012   Lymphocytic colitis 11/03/2012   Paget's disease of nipple ([place]) 11/03/2012    Vitamin D deficiency 11/03/2012   Osteoarthritis 11/03/2012    ONSET DATE: Liana Gerold 26th or 27th 2023  REFERRING DIAG: R42 (ICD-10-CM) - Vertigo  THERAPY DIAG:  Dizziness and giddiness  Other abnormalities of gait and mobility  Rationale for Evaluation and Treatment Rehabilitation  SUBJECTIVE:   SUBJECTIVE STATEMENT: She relays dizziness and room spinning since the middle of last week she went to lay down to go to sleep. She has nausea. She had one previous episode of this last April that got better in a week. She denies any neck pain. She does take chemo once every 21 days. She only sleeps on her left side usually  Pt accompanied by: significant other  PERTINENT HISTORY:    PAIN:  Are you having pain? No  PRECAUTIONS: None  WEIGHT BEARING RESTRICTIONS No  FALLS: Has patient fallen in last 6 months? No, but feels unsteady at this time due to vertgo  PLOF: Independent with basic ADLs  PATIENT GOALS get rid of dizziness  OBJECTIVE:   DIAGNOSTIC FINDINGS:   COGNITION: Overall cognitive status: Within functional limits for tasks assessed   SENSATION:   EDEMA:    MUSCLE TONE:   DTRs:    POSTURE:    Cervical ROM:    Active A/PROM (deg) eval  Flexion WFL  Extension WFL  Right lateral flexion   Left lateral flexion   Right rotation WFL  Left rotation WFL  (Blank rows = not tested)  STRENGTH:   LOWER EXTREMITY MMT:   MMT Right eval Left eval  Hip flexion    Hip abduction    Hip adduction    Hip internal rotation    Hip external rotation    Knee flexion    Knee extension    Ankle dorsiflexion    Ankle plantarflexion    Ankle inversion    Ankle eversion    (Blank rows = not tested)  BED MOBILITY:  Sit to supine Min A  TRANSFERS: Supervision with transfers due to unsteadiness on her feet.   GAIT: Comments: min A with ambulation arriving and exiting clinic due to dizziness and unsteady gait. She relays independent gait is her  PLOF  FUNCTIONAL TESTs:    PATIENT SURVEYS:     VESTIBULAR ASSESSMENT   GENERAL OBSERVATION: unsteady gait    SYMPTOM BEHAVIOR:   Subjective history: feels like room spinning, short duration of symptoms about a minute   Non-Vestibular symptoms: no change in hearing, or vision   Type of dizziness: Spinning/Vertigo, rocking on a boat   Frequency: spinning just when she gets in and out of bed   Duration: 1 minute   Aggravating factors: Induced by position change: lying supine, rolling to the right, rolling to the left, and supine to sit   Relieving factors: no known relieving factors   Progression of symptoms: unchanged   OCULOMOTOR EXAM:   Ocular Alignment:  normal   Ocular ROM: No Limitations   Spontaneous Nystagmus: absent   Gaze-Induced Nystagmus: absent   Smooth Pursuits: intact   Saccades: intact   Convergence/Divergence: WNL     Horizontal head shaking - induced nystagmus: negative      VESTIBULAR - OCULAR REFLEX:    Slow VOR: Normal   VOR Cancellation: Normal   Head-Impulse Test: HIT Right: negative HIT Left: negative   Dynamic Visual Acuity:     POSITIONAL TESTING:  Right Dix-Hallpike: no nystagmus Left Dix-Hallpike: upbeating, nystagmus present    MOTION SENSITIVITY:    Motion Sensitivity Quotient  Intensity: 0 = none, 1 = Lightheaded, 2 = Mild, 3 = Moderate, 4 = Severe, 5 = Vomiting  Intensity  1. Sitting to supine   2. Supine to L side   3. Supine to R side   4. Supine to sitting   5. L Hallpike-Dix   6. Up from L    7. R Hallpike-Dix   8. Up from R    9. Sitting, head  tipped to L knee   10. Head up from L  knee   11. Sitting, head  tipped to R knee   12. Head up from R  knee   13. Sitting head turns x5   14.Sitting head nods x5   15. In stance, 180  turn to L    16. In stance, 180  turn to R     OTHOSTATICS: not done   VESTIBULAR TREATMENT:  Canalith Repositioning:   Epley Left: Number of Reps: 2 Gaze  Adaptation:    Habituation:    Other:   PATIENT EDUCATION: Education details: exam findings, BBPV, HEP, PT plan of care Person educated: Patient and Spouse Education method: Explanation, Demonstration, Verbal cues, and Handouts Education comprehension: verbalized understanding and returned demonstration  HOME EXERCISE PROGRAM Access Code: [email or web address] URL: [email or web address] Date: 07/24/2022 Prepared by: Elsie Ra  Exercises - Self-Epley Maneuver Left Ear  - 1 x daily - 3-4 x weekly - 1 sets - 1 reps  Patient Education - BPPV - After BPPV Repositioning  GOALS: Goals reviewed with patient? Yes  SHORT TERM GOALS: Target date: 08/20/2022   Pt will show understanding of how to perform self BBPV treatment Baseline: Goal status: MET today  2.  Pt will report 50% improvement in vertigo symptoms Baseline:  Goal status: INITIAL   LONG TERM GOALS: Target date: 09/03/2022    Pt will report >75% improvement in vertigo symptoms Baseline:  Goal status: INITIAL  2.  Pt will be able to ambulate community distances without assistance due to less dizziness and less unsteady gait Baseline:  Goal status: INITIAL   ASSESSMENT:  CLINICAL IMPRESSION: Patient is a 76 y.o. woman who was seen today for physical therapy evaluation and treatment for Vertigo. Central vestibular disorder testing was negative and BPPV testing was positive for left ear BPPV vertigo. I treated her with Epply maneuver for this and provided her with education on this condition and HEP to follow. She will benefit from skilled PT to address her impairments listed below.     OBJECTIVE IMPAIRMENTS Abnormal gait, decreased activity tolerance, decreased balance, decreased strength, and dizziness.   ACTIVITY LIMITATIONS carrying, lifting, bending, sleeping, bed mobility, and locomotion level  PARTICIPATION LIMITATIONS: cleaning, laundry, driving, shopping, and community activity  PERSONAL FACTORS  Past/current experiences , see above co-morbidities having another episode of thisare also affecting patient's functional outcome.   REHAB POTENTIAL: Excellent  CLINICAL DECISION MAKING: Stable/uncomplicated  EVALUATION COMPLEXITY: Low   PLAN: PT FREQUENCY: 1x/week but likely only a few visits needed  PT DURATION: 6 weeks  PLANNED INTERVENTIONS: Therapeutic exercises, Therapeutic activity, Neuromuscular re-education, Balance training, Gait training, Patient/Family education, Self Care, Joint mobilization, Vestibular training, Canalith repositioning, Moist heat, Taping, Manual therapy, and Re-evaluation  PLAN FOR NEXT SESSION: Review epply maneuver, further balance testing   Debbe Odea, PT,DPT 07/23/2022, 4:15 PM

## 2022-07-23 NOTE — Progress Notes (Signed)
Autumn Conley is a 76 y.o. female here for a follow up of a pre-existing problem.  History of Present Illness:   Chief Complaint  Patient presents with  . Edema    Pt c/o pain right lower extremity and is having swelling in both lower extremities R>L.  . Dizziness    Pt c/o vertigo started again x 1 week and eyes are real puffy. Using Meclizine, but not helping much.    HPI  RLE swelling Went to urgent care on 07/21/22 with swelling, redness and pain in her right leg. U/S was performed and this was negative for DVT. Denies chest pain, SOB, weeping. Has had issues in the past with this leg. Prior u/s in April showed venous reflux on the left leg Wears compression hose regularly and this is not helping.  Eating very little salt She is able to bear weight but has significant pain  Dizziness Has had issues in the past and symptoms returned a week ago Using meclizine without help -- has not had any today Gets nauseated.  Triggered when she changes positions Denies chest pain, lightheadedness, SOB  Past Medical History:  Diagnosis Date  . Anemia   . Anxiety   . Breast cancer ([place])   . Depression   . Dyspnea   . Fibromyalgia    "some; not chronic" (11/29/2014)  . GERD (gastroesophageal reflux disease)   . Glaucoma of both eyes   . Headache   . Hypertension   . IBS (irritable bowel syndrome)   . Mitral valve prolapse   . NASH (nonalcoholic steatohepatitis)   . Osteoarthritis   . PONV (postoperative nausea and vomiting)   . Stroke ([place])   . 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[facility])      Social History   Tobacco Use  . Smoking status: Never  . Smokeless tobacco: Never  Vaping Use  . Vaping Use: Never used  Substance Use Topics  . Alcohol use: No  . Drug use: No    Past Surgical History:  Procedure Laterality Date  . ABDOMINAL HYSTERECTOMY  1980  . APPENDECTOMY  1953  . BIOPSY  05/03/2020   Procedure: BIOPSY;  Surgeon: Gatha Mayer, MD;  Location: [place];  Service:  Endoscopy;;  . BREAST BIOPSY Left   . BREAST LUMPECTOMY Left   . CATARACT EXTRACTION, BILATERAL  2018  . COLONOSCOPY WITH PROPOFOL N/A 05/03/2020   Procedure: COLONOSCOPY WITH PROPOFOL;  Surgeon: Gatha Mayer, MD;  Location: [facility] ENDOSCOPY;  Service: Endoscopy;  Laterality: N/A;  . ESOPHAGOGASTRODUODENOSCOPY (EGD) WITH PROPOFOL N/A 05/03/2020   Procedure: ESOPHAGOGASTRODUODENOSCOPY (EGD) WITH PROPOFOL;  Surgeon: Gatha Mayer, MD;  Location: [place];  Service: Endoscopy;  Laterality: N/A;  . INTERCOSTAL NERVE BLOCK Right 04/19/2021   Procedure: INTERCOSTAL NERVE BLOCK;  Surgeon: Lajuana Matte, MD;  Location: [place];  Service: Thoracic;  Laterality: Right;  . MASTECTOMY Left ~ 2009  . PARASTERNAL EXPLORATION Right 02/14/2019   Procedure: PARASTERNAL MEDIAL EXPLORATION WITH BIOPIES.;  Surgeon: [name], MD;  Location: [place];  Service: Thoracic;  Laterality: Right;  . POLYPECTOMY  05/03/2020   Procedure: POLYPECTOMY;  Surgeon: Gatha Mayer, MD;  Location: [facility] ENDOSCOPY;  Service: Endoscopy;;    Family History  Problem Relation Age of Onset  . Lung cancer Mother 38  . Prostate cancer Brother 56  . Bladder Cancer Neg Hx   . Kidney cancer Neg Hx     Allergies  Allergen Reactions  . Latex Rash and Other (See Comments)  Blisters, also    Current Medications:   Current Outpatient Medications:  .  acetaminophen (TYLENOL) 500 MG tablet, Take by mouth., Disp: , Rfl:  .  ALPRAZolam (XANAX) 0.25 MG tablet, Take 0.5 tablets (0.125 mg total) by mouth daily as needed for anxiety., Disp: 30 tablet, Rfl: 0 .  Ascorbic Acid (VITAMIN C) 1000 MG tablet, Take 1,000 mg by mouth daily., Disp: , Rfl:  .  atorvastatin (LIPITOR) 40 MG tablet, TAKE 1 TABLET BY MOUTH AT  BEDTIME, Disp: 90 tablet, Rfl: 1 .  B-D 3CC LUER-LOK SYR 25GX1" 25G X 1" 3 ML MISC, USE TO INJECT  CYANOCOBALAMIN ONCE MONTHLY, Disp: 3 each, Rfl: 0 .  butalbital-acetaminophen-caffeine (FIORICET) 50-325-40 MG tablet,  TAKE 1 TO 2 TABLETS BY MOUTH EVERY 6 HOURS AS NEEDED FOR HEADACHES(S). MANUFACTURER RECOMMENDS NOT EXCEEDING 6 TABLETS PER DAY., Disp: 20 tablet, Rfl: 1 .  Cholecalciferol (VITAMIN D-3 PO), Take 1 capsule by mouth daily., Disp: , Rfl:  .  cyanocobalamin (,VITAMIN B-12,) 1000 MCG/ML injection, INJECT INTRAMUSCULARLY 1ML  EVERY 30 DAYS (DISCARD 28  DAYS AFTER FIRST USE), Disp: 3 mL, Rfl: 0 .  dexamethasone (DECADRON) 1 MG tablet, Take 1 mg by mouth once., Disp: , Rfl:  .  docusate sodium (COLACE) 100 MG capsule, Take 100 mg by mouth 2 (two) times daily as needed for mild constipation or moderate constipation., Disp: , Rfl:  .  folic acid (FOLVITE) 1 MG tablet, Take 1 mg by mouth daily., Disp: , Rfl:  .  furosemide (LASIX) 20 MG tablet, Take 1 tablet (20 mg total) by mouth daily as needed for edema., Disp: 30 tablet, Rfl: 0 .  HYDROcodone-acetaminophen (NORCO/VICODIN) 5-325 MG tablet, Take 1 tablet by mouth every 6 (six) hours as needed for moderate pain., Disp: , Rfl:  .  irbesartan (AVAPRO) 150 MG tablet, TAKE 1 TABLET BY MOUTH  DAILY, Disp: 90 tablet, Rfl: 3 .  lidocaine-prilocaine (EMLA) cream, Apply 1 application. topically as needed., Disp: , Rfl:  .  meclizine (ANTIVERT) 12.5 MG tablet, Take 1 tablet (12.5 mg total) by mouth 3 (three) times daily as needed for dizziness., Disp: 30 tablet, Rfl: 0 .  mupirocin ointment (BACTROBAN) 2 %, Apply to nasal area 1-2 times daily, Disp: 22 g, Rfl: 0 .  ondansetron (ZOFRAN) 8 MG tablet, Take 8 mg by mouth every 8 (eight) hours as needed for nausea or vomiting., Disp: , Rfl:  .  pantoprazole (PROTONIX) 40 MG tablet, Take 1 tablet (40 mg total) by mouth daily before breakfast., Disp: 30 tablet, Rfl: 0 .  PARoxetine (PAXIL) 20 MG tablet, TAKE 3 TABLETS BY MOUTH IN THE  MORNING, Disp: 270 tablet, Rfl: 3 .  polyethylene glycol powder (GLYCOLAX/MIRALAX) 17 GM/SCOOP powder, Take 17 g by mouth daily as needed for mild constipation., Disp: , Rfl:  .  SUMAtriptan  (IMITREX) 100 MG tablet, Take 1 tablet (100 mg total) by mouth 2 (two) times daily as needed. At least 2 hrs between doses as needed bid, Disp: 30 tablet, Rfl: 1 .  triamcinolone cream (KENALOG) 0.1 %, SMARTSIG:1 Application Topical 2-3 Times Daily, Disp: , Rfl:  .  valACYclovir (VALTREX) 500 MG tablet, Take 1 tablet (500 mg total) by mouth daily., Disp: 90 tablet, Rfl: 1 .  vitamin E 180 MG (400 UNITS) capsule, Take 400 Units by mouth daily., Disp: , Rfl:    Review of Systems:   ROS Negative unless otherwise specified per HPI.  Vitals:   Vitals:   07/23/22 1134  BP: 128/70  Pulse: (!) 105  Temp: 97.8 F (36.6 C)  TempSrc: Temporal  SpO2: 94%  Weight: 168 lb 8 oz (76.4 kg)  Height: 5' 3"  (1.6 m)     Body mass index is 29.85 kg/m.  Physical Exam:   Physical Exam Vitals and nursing note reviewed.  Constitutional:      General: She is not in acute distress.    Appearance: She is well-developed. She is not ill-appearing or toxic-appearing.  Cardiovascular:     Rate and Rhythm: Normal rate and regular rhythm.     Pulses: Normal pulses.          Dorsalis pedis pulses are 2+ on the right side and 2+ on the left side.       Posterior tibial pulses are 2+ on the right side and 2+ on the left side.     Heart sounds: Normal heart sounds, S1 normal and S2 normal.  Pulmonary:     Effort: Pulmonary effort is normal.     Breath sounds: Normal breath sounds.  Musculoskeletal:     Right lower leg: Edema present.     Comments: Slight erythema to R lateral ankle Tenderness to R lateral ankle  Skin:    General: Skin is warm and dry.  Neurological:     Mental Status: She is alert.     GCS: GCS eye subscore is 4. GCS verbal subscore is 5. GCS motor subscore is 6.  Psychiatric:        Speech: Speech normal.        Behavior: Behavior normal. Behavior is cooperative.     Assessment and Plan:   Vertigo No red flags Referral for vestibular rehab Follow-up as needed with  [place]  Swelling of lower leg U/S neg for DVT Will trial prn lasix 20 mg Referral to Vascular for further evaluation Will obtain ankle X-ray to rule out bone abnormality If new/worsening issues in the meantime, I asked her to reach out to [place]    [name], PA-C

## 2022-07-29 DIAGNOSIS — Z79631 Long term (current) use of antimetabolite agent: Secondary | ICD-10-CM | POA: Diagnosis not present

## 2022-07-29 DIAGNOSIS — R42 Dizziness and giddiness: Secondary | ICD-10-CM | POA: Diagnosis not present

## 2022-07-29 DIAGNOSIS — Z853 Personal history of malignant neoplasm of breast: Secondary | ICD-10-CM | POA: Diagnosis not present

## 2022-07-29 DIAGNOSIS — C37 Malignant neoplasm of thymus: Secondary | ICD-10-CM | POA: Diagnosis not present

## 2022-07-29 DIAGNOSIS — R2243 Localized swelling, mass and lump, lower limb, bilateral: Secondary | ICD-10-CM | POA: Diagnosis not present

## 2022-07-29 DIAGNOSIS — M7989 Other specified soft tissue disorders: Secondary | ICD-10-CM | POA: Diagnosis not present

## 2022-07-29 DIAGNOSIS — D4989 Neoplasm of unspecified behavior of other specified sites: Secondary | ICD-10-CM | POA: Diagnosis not present

## 2022-07-29 DIAGNOSIS — R059 Cough, unspecified: Secondary | ICD-10-CM | POA: Diagnosis not present

## 2022-07-29 DIAGNOSIS — Z95828 Presence of other vascular implants and grafts: Secondary | ICD-10-CM | POA: Diagnosis not present

## 2022-07-30 ENCOUNTER — Other Ambulatory Visit: Payer: Self-pay | Admitting: Physician Assistant

## 2022-07-31 ENCOUNTER — Encounter: Payer: Self-pay | Admitting: Physical Therapy

## 2022-07-31 ENCOUNTER — Ambulatory Visit (INDEPENDENT_AMBULATORY_CARE_PROVIDER_SITE_OTHER): Payer: Medicare Other

## 2022-07-31 ENCOUNTER — Ambulatory Visit (INDEPENDENT_AMBULATORY_CARE_PROVIDER_SITE_OTHER): Payer: Medicare Other | Admitting: Physical Therapy

## 2022-07-31 DIAGNOSIS — R2689 Other abnormalities of gait and mobility: Secondary | ICD-10-CM | POA: Diagnosis not present

## 2022-07-31 DIAGNOSIS — R42 Dizziness and giddiness: Secondary | ICD-10-CM

## 2022-07-31 DIAGNOSIS — Z23 Encounter for immunization: Secondary | ICD-10-CM | POA: Diagnosis not present

## 2022-07-31 NOTE — Progress Notes (Signed)
Pt received flu shot, in right deltoid. Pt tolerated well.

## 2022-07-31 NOTE — Therapy (Addendum)
OUTPATIENT PHYSICAL THERAPY VESTIBULAR TREATMENT NOTE DISCHARGE SUMMARY   Patient Name: Autumn Conley MRN: [id number] DOB:12-25-1945, 76 y.o., female Today's Date: 07/31/2022   PT End of Session - 07/31/22 1428     Visit Number 2    Number of Visits 4    Date for PT Re-Evaluation 09/03/22    Authorization Type MCR    PT Start Time 1426    PT Stop Time 1506    PT Time Calculation (min) 40 min    Activity Tolerance Patient tolerated treatment well    Behavior During Therapy WFL for tasks assessed/performed               Past Medical History:  Diagnosis Date   Anemia    Anxiety    Breast cancer ([place])    Depression    Dyspnea    Fibromyalgia    "some; not chronic" (11/29/2014)   GERD (gastroesophageal reflux disease)    Glaucoma of both eyes    Headache    Hypertension    IBS (irritable bowel syndrome)    Mitral valve prolapse    NASH (nonalcoholic steatohepatitis)    Osteoarthritis    PONV (postoperative nausea and vomiting)    Stroke ([place])    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[facility])    Past Surgical History:  Procedure Laterality Date   [place]   BIOPSY  05/03/2020   Procedure: BIOPSY;  Surgeon: Gatha Mayer, MD;  Location: [place];  Service: Endoscopy;;   BREAST BIOPSY Left    BREAST LUMPECTOMY Left    CATARACT EXTRACTION, BILATERAL  2018   COLONOSCOPY WITH PROPOFOL N/A 05/03/2020   Procedure: COLONOSCOPY WITH PROPOFOL;  Surgeon: Gatha Mayer, MD;  Location: [place];  Service: Endoscopy;  Laterality: N/A;   ESOPHAGOGASTRODUODENOSCOPY (EGD) WITH PROPOFOL N/A 05/03/2020   Procedure: ESOPHAGOGASTRODUODENOSCOPY (EGD) WITH PROPOFOL;  Surgeon: Gatha Mayer, MD;  Location: [place];  Service: Endoscopy;  Laterality: N/A;   INTERCOSTAL NERVE BLOCK Right 04/19/2021   Procedure: INTERCOSTAL NERVE BLOCK;  Surgeon: Lajuana Matte, MD;  Location: [place];  Service: Thoracic;  Laterality: Right;   MASTECTOMY Left ~ 2009    PARASTERNAL EXPLORATION Right 02/14/2019   Procedure: PARASTERNAL MEDIAL EXPLORATION WITH BIOPIES.;  Surgeon: [name], MD;  Location: [place];  Service: Thoracic;  Laterality: Right;   POLYPECTOMY  05/03/2020   Procedure: POLYPECTOMY;  Surgeon: Gatha Mayer, MD;  Location: [facility] ENDOSCOPY;  Service: Endoscopy;;   Patient Active Problem List   Diagnosis Date Noted   S/P lung surgery, follow-up exam    Acute on chronic diastolic CHF (congestive heart failure) ([place]) 04/18/2021   CHF (congestive heart failure) ([place]) 04/18/2021   Pericardial effusion 04/18/2021   Pleural effusion 04/18/2021   Recurrent UTI 06/28/2020   Inflammatory arthritis 05/16/2020   Erosive gastritis    Benign neoplasm of ascending colon    Heme + stool    Change in bowel habit    Anemia 05/01/2020   Dyspnea 05/01/2020   Generalized weakness 05/01/2020   UTI (urinary tract infection) 05/01/2020   Symptomatic anemia 05/01/2020   Glaucoma of both eyes    NASH (nonalcoholic steatohepatitis)    Mitral valve prolapse    Thymoma -- managed by [facility] - extensive documentation in Care Everywhere 03/22/2019   Breast cancer ([place]) 03/18/2019   Mass of upper lobe of right lung 01/20/2019   B12 deficiency 12/19/2018   Stress incontinence in female 12/19/2018  Dyslipidemia 12/14/2018   OSA (obstructive sleep apnea) 12/07/2018   History of breast cancer 12/07/2018   Cognitive complaints 11/25/2018   False positive stress test 01/29/2015   Migraine without aura and without status migrainosus, not intractable 12/18/2014   Small vessel disease, cerebrovascular 12/18/2014   GERD (gastroesophageal reflux disease) 12/02/2014   HTN (hypertension) 12/27/2013   Anxiety 12/27/2013   Depression 11/03/2012   Elevated LFTs 11/03/2012   Fibromyalgia 11/03/2012   IBS (irritable bowel syndrome) 11/03/2012   Lymphocytic colitis 11/03/2012   Paget's disease of nipple ([place]) 11/03/2012   Vitamin D deficiency 11/03/2012    Osteoarthritis 11/03/2012         ONSET DATE: Liana Gerold 26th or 27th 2023  REFERRING DIAG: R42 (ICD-10-CM) - Vertigo  EVAL THERAPY DIAG:  Dizziness and giddiness  Other abnormalities of gait and mobility  Rationale for Evaluation and Treatment Rehabilitation  SUBJECTIVE:   SUBJECTIVE STATEMENT: Symptoms are about 50% improved; symptoms are different now.  When lying down and head is turned to Lt - things are moving.  Pt accompanied by: significant other  PAIN:  Are you having pain? No  PRECAUTIONS: None  WEIGHT BEARING RESTRICTIONS No  FALLS: Has patient fallen in last 6 months? No, but feels unsteady at this time due to vertgo  PLOF: Independent with basic ADLs  PATIENT GOALS get rid of dizziness  OBJECTIVE:   Cervical ROM:    Active A/PROM (deg) eval  Flexion WFL  Extension WFL  Right lateral flexion   Left lateral flexion   Right rotation WFL  Left rotation WFL  (Blank rows = not tested)  BED MOBILITY:  Sit to supine Min A  TRANSFERS: Supervision with transfers due to unsteadiness on her feet.  GAIT: Comments: min A with ambulation arriving and exiting clinic due to dizziness and unsteady gait. She relays independent gait is her PLOF   VESTIBULAR ASSESSMENT (EVAL)  GENERAL OBSERVATION: unsteady gait   SYMPTOM BEHAVIOR: Subjective history: feels like room spinning, short duration of symptoms about a minute Non-Vestibular symptoms: no change in hearing, or vision Type of dizziness: Spinning/Vertigo, rocking on a boat Frequency: spinning just when she gets in and out of bed Duration: 1 minute Aggravating factors: Induced by position change: lying supine, rolling to the right, rolling to the left, and supine to sit Relieving factors: no known relieving factors Progression of symptoms: unchanged  OCULOMOTOR EXAM: Ocular Alignment: normal Ocular ROM: No Limitations Spontaneous Nystagmus: absent Gaze-Induced Nystagmus: absent Smooth  Pursuits: intact Saccades: intact Convergence/Divergence: WNL   Horizontal head shaking - induced nystagmus: negative     VESTIBULAR - OCULAR REFLEX:  Slow VOR: Normal VOR Cancellation: Normal Head-Impulse Test: HIT Right: negative HIT Left: negative Dynamic Visual Acuity:    POSITIONAL TESTING:  Right Dix-Hallpike: no nystagmus Left Dix-Hallpike: upbeating, nystagmus present  OTHOSTATICS: not done   VESTIBULAR TREATMENT:  07/31/22 POSITIONAL TESTS:  Left Dix-Hallpike: upbeating, left nystagmus and Duration: 10 sec Right Roll Test: no nystagmus Left Roll Test:  no nystagmus; sensation of over rolling    VESTIBULAR TREATMENT:  Canalith Repositioning: Epley Left: Number of Reps: 2 and Response to Treatment: symptoms improved    Gaze Adaptation: x1 Viewing Horizontal: Position: seated, Time: 15 sec, and Reps: 1  Habituation: Repeated Rolling: comment: x 3 reps; symptoms resolved after 1st rep; to Lt only   Other: Discussed HEP and recommended trial of [employer]; did not perform but instructed in how to do at home.   EVAL Canalith Repositioning: Epley Left:  Number of Reps: 2 Gaze Adaptation:    PATIENT EDUCATION: Education details: exam findings, BBPV, HEP, PT plan of care Person educated: Patient and Spouse Education method: Explanation, Demonstration, Verbal cues, and Handouts Education comprehension: verbalized understanding and returned demonstration  HOME EXERCISE PROGRAM Access Code: [email or web address] URL: [email or web address] Date: 07/31/2022 Prepared by: Faustino Congress  Exercises - Supine to Left Sidelying Vestibular Habituation  - 1 x daily - 7 x weekly - 1 sets - 5 reps - Brandt-Daroff Vestibular Exercise  - 2 x daily - 7 x weekly - 1 sets - 3-5 reps - Seated Gaze Stabilization with Head Rotation  - 2 x daily - 7 x weekly - 1 sets - 30 seconds  Patient Education - BPPV - After BPPV Repositioning  GOALS: Goals reviewed with  patient? Yes  SHORT TERM GOALS: Target date: 08/20/2022   Pt will show understanding of how to perform self BBPV treatment Baseline: Goal status: MET today  2.  Pt will report 50% improvement in vertigo symptoms Baseline:  Goal status: INITIAL   LONG TERM GOALS: Target date: 09/03/2022    Pt will report >75% improvement in vertigo symptoms Baseline:  Goal status: INITIAL  2.  Pt will be able to ambulate community distances without assistance due to less dizziness and less unsteady gait Baseline:  Goal status: INITIAL   ASSESSMENT:  CLINICAL IMPRESSION: Pt with mild residual BPPV at this time as well as some mild symptoms of vestibular hypofunction.  Still needed Epley's today, with improved symptoms.  Will continue to benefit from PT to maximize function.  OBJECTIVE IMPAIRMENTS Abnormal gait, decreased activity tolerance, decreased balance, decreased strength, and dizziness.   ACTIVITY LIMITATIONS carrying, lifting, bending, sleeping, bed mobility, and locomotion level  PARTICIPATION LIMITATIONS: cleaning, laundry, driving, shopping, and community activity  PERSONAL FACTORS Past/current experiences , see above co-morbidities having another episode of thisare also affecting patient's functional outcome.   REHAB POTENTIAL: Excellent  CLINICAL DECISION MAKING: Stable/uncomplicated  EVALUATION COMPLEXITY: Low   PLAN: PT FREQUENCY: 1x/week but likely only a few visits needed  PT DURATION: 6 weeks  PLANNED INTERVENTIONS: Therapeutic exercises, Therapeutic activity, Neuromuscular re-education, Balance training, Gait training, Patient/Family education, Self Care, Joint mobilization, Vestibular training, Canalith repositioning, Moist heat, Taping, Manual therapy, and Re-evaluation  PLAN FOR NEXT SESSION: reassess canals,  review HEP, further balance testing    Laureen Abrahams, PT, DPT 07/31/22 3:08 PM     PHYSICAL THERAPY DISCHARGE SUMMARY  Visits from Start  of Care: 2  Current functional level related to goals / functional outcomes: See above   Remaining deficits: See above   Education / Equipment: HEP   Patient agrees to discharge. Patient goals were not met. Patient is being discharged due to not returning since the last visit.  Laureen Abrahams, PT, DPT 11/06/22 12:17 PM  [facility] [address] [place], [place], [address] Phone: [phone]   Fax:  [phone]

## 2022-08-05 DIAGNOSIS — D4989 Neoplasm of unspecified behavior of other specified sites: Secondary | ICD-10-CM | POA: Diagnosis not present

## 2022-08-05 DIAGNOSIS — C50011 Malignant neoplasm of nipple and areola, right female breast: Secondary | ICD-10-CM | POA: Diagnosis not present

## 2022-08-05 DIAGNOSIS — M7989 Other specified soft tissue disorders: Secondary | ICD-10-CM | POA: Diagnosis not present

## 2022-08-05 DIAGNOSIS — M79604 Pain in right leg: Secondary | ICD-10-CM | POA: Diagnosis not present

## 2022-08-07 ENCOUNTER — Encounter: Payer: Medicare Other | Admitting: Physical Therapy

## 2022-08-11 DIAGNOSIS — M79604 Pain in right leg: Secondary | ICD-10-CM | POA: Diagnosis not present

## 2022-08-11 DIAGNOSIS — M7989 Other specified soft tissue disorders: Secondary | ICD-10-CM | POA: Diagnosis not present

## 2022-08-14 ENCOUNTER — Other Ambulatory Visit: Payer: Self-pay | Admitting: Physician Assistant

## 2022-08-18 ENCOUNTER — Encounter: Payer: Medicare Other | Admitting: Physical Therapy

## 2022-08-18 DIAGNOSIS — I10 Essential (primary) hypertension: Secondary | ICD-10-CM | POA: Diagnosis not present

## 2022-08-18 DIAGNOSIS — E785 Hyperlipidemia, unspecified: Secondary | ICD-10-CM | POA: Diagnosis not present

## 2022-08-18 DIAGNOSIS — I872 Venous insufficiency (chronic) (peripheral): Secondary | ICD-10-CM | POA: Diagnosis not present

## 2022-08-18 DIAGNOSIS — M79604 Pain in right leg: Secondary | ICD-10-CM | POA: Diagnosis not present

## 2022-08-18 DIAGNOSIS — K219 Gastro-esophageal reflux disease without esophagitis: Secondary | ICD-10-CM | POA: Diagnosis not present

## 2022-08-18 DIAGNOSIS — M79605 Pain in left leg: Secondary | ICD-10-CM | POA: Diagnosis not present

## 2022-08-19 DIAGNOSIS — D4989 Neoplasm of unspecified behavior of other specified sites: Secondary | ICD-10-CM | POA: Diagnosis not present

## 2022-08-22 ENCOUNTER — Ambulatory Visit: Payer: Medicare Other | Attending: Internal Medicine | Admitting: Internal Medicine

## 2022-08-22 ENCOUNTER — Encounter: Payer: Self-pay | Admitting: Internal Medicine

## 2022-08-22 VITALS — BP 110/68 | HR 103 | Ht 63.0 in | Wt 164.0 lb

## 2022-08-22 DIAGNOSIS — I10 Essential (primary) hypertension: Secondary | ICD-10-CM | POA: Insufficient documentation

## 2022-08-22 DIAGNOSIS — R002 Palpitations: Secondary | ICD-10-CM | POA: Diagnosis not present

## 2022-08-22 DIAGNOSIS — Z9889 Other specified postprocedural states: Secondary | ICD-10-CM | POA: Diagnosis not present

## 2022-08-22 DIAGNOSIS — I3139 Other pericardial effusion (noninflammatory): Secondary | ICD-10-CM | POA: Diagnosis not present

## 2022-08-22 DIAGNOSIS — I350 Nonrheumatic aortic (valve) stenosis: Secondary | ICD-10-CM | POA: Diagnosis not present

## 2022-08-22 DIAGNOSIS — R0600 Dyspnea, unspecified: Secondary | ICD-10-CM | POA: Diagnosis not present

## 2022-08-22 DIAGNOSIS — D4989 Neoplasm of unspecified behavior of other specified sites: Secondary | ICD-10-CM | POA: Insufficient documentation

## 2022-08-22 NOTE — Patient Instructions (Addendum)
Medication Instructions:  No Changes In Medications at this time.  *If you need a refill on your cardiac medications before your next appointment, please call your pharmacy*  Testing/Procedures: Your physician has requested that you have an echocardiogram IN 6 MONTHS. Echocardiography is a painless test that uses sound waves to create images of your heart. It provides your doctor with information about the size and shape of your heart and how well your heart's chambers and valves are working. You may receive an ultrasound enhancing agent through an IV if needed to better visualize your heart during the echo.This procedure takes approximately one hour. There are no restrictions for this procedure. This will take place at the [address], [address].   Follow-Up: At [facility], you and your health needs are our priority.  As part of our continuing mission to provide you with exceptional heart care, we have created designated Provider Care Teams.  These Care Teams include your primary Cardiologist (physician) and Advanced Practice Providers (APPs -  Physician Assistants and Nurse Practitioners) who all work together to provide you with the care you need, when you need it.  Your next appointment:   6 month(s) RIGHT AFTER ECHO  The format for your next appointment:   In Person  Provider:   [name], MD     Other Instructions PLEASE CONSIDER GETTING A KARDIA MOBILE DEVICE TO USE THIS AS NEEDED FOR PALPITATIONS

## 2022-08-22 NOTE — Progress Notes (Signed)
Cardiology Office Note:    Date:  08/22/2022   ID:  Autumn Conley, DOB 1945/11/06, MRN [id number]  PCP:  [name], PA  Cardiologist:  [name], MD  Electrophysiologist:  None   Referring MD: [name], PA   Chief Complaint/Reason for Referral: Follow up pericardial effusion s/p pericardial window  History of Present Illness:    Autumn Conley is a 76 y.o. female with a history of metastatic thyoma being followed by Oncology, Hx of Pagets disease of breast, HTN, HLD, anxiety, IBS, NASH, fibromyalgia who presents for follow up of generalized weakness, dizziness, shortness of breath with exertion. Symptomatic anemia related to malignancy, treated with iron supplementation with IV iron.  Status post pericardial window July 2022 for symptomatic pericardial effusion. She has been found to have recurrent disease on surveillance imaging related to metastatic thymoma and is undergoing chemotherapy intravenously with Dr. Mindi Junker [facility].  She is following closely with oncology.  Last visit, she endorsed mild fatigue and shortness of breath, which she attributes to her oncologic illness. She also complained of significant edema in her right leg, as well as soreness, rash and erythematous skin.  Today:  She had an episode of tachycardia at 140-150 bpm which lasted 30 minutes. She has had tachycardic episodes since, but they have all been less than one minute.  She still suffers from shortness of breath, but it has improved significantly. Her chest pain is stable. She expects it to continue, because she has been told it is caused by nerve damage and scar tissue.  She still has swelling and redness in her lower extremities. She has been told it is a reaction to the chemo, and that the symptoms will subside eventually.  Her hearing has become significantly worse.  She denies any lightheadedness, headaches, syncope, orthopnea, or PND.    Past Medical  History:  Diagnosis Date   Anemia    Anxiety    Breast cancer ([place])    Depression    Dyspnea    Fibromyalgia    "some; not chronic" (11/29/2014)   GERD (gastroesophageal reflux disease)    Glaucoma of both eyes    Headache    Hypertension    IBS (irritable bowel syndrome)    Mitral valve prolapse    NASH (nonalcoholic steatohepatitis)    Osteoarthritis    PONV (postoperative nausea and vomiting)    Stroke ([place])    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[facility])     Past Surgical History:  Procedure Laterality Date   [place]   BIOPSY  05/03/2020   Procedure: BIOPSY;  Surgeon: Gatha Mayer, MD;  Location: [place];  Service: Endoscopy;;   BREAST BIOPSY Left    BREAST LUMPECTOMY Left    CATARACT EXTRACTION, BILATERAL  2018   COLONOSCOPY WITH PROPOFOL N/A 05/03/2020   Procedure: COLONOSCOPY WITH PROPOFOL;  Surgeon: Gatha Mayer, MD;  Location: [place];  Service: Endoscopy;  Laterality: N/A;   ESOPHAGOGASTRODUODENOSCOPY (EGD) WITH PROPOFOL N/A 05/03/2020   Procedure: ESOPHAGOGASTRODUODENOSCOPY (EGD) WITH PROPOFOL;  Surgeon: Gatha Mayer, MD;  Location: [place];  Service: Endoscopy;  Laterality: N/A;   INTERCOSTAL NERVE BLOCK Right 04/19/2021   Procedure: INTERCOSTAL NERVE BLOCK;  Surgeon: Lajuana Matte, MD;  Location: [place];  Service: Thoracic;  Laterality: Right;   MASTECTOMY Left ~ 2009   PARASTERNAL EXPLORATION Right 02/14/2019   Procedure: PARASTERNAL MEDIAL EXPLORATION WITH BIOPIES.;  Surgeon: [name], MD;  Location:  [place] OR;  Service: Thoracic;  Laterality: Right;   POLYPECTOMY  05/03/2020   Procedure: POLYPECTOMY;  Surgeon: Gatha Mayer, MD;  Location: [facility] ENDOSCOPY;  Service: Endoscopy;;    Current Medications: Current Meds  Medication Sig   acetaminophen (TYLENOL) 500 MG tablet Take by mouth.   ALPRAZolam (XANAX) 0.25 MG tablet Take 0.5 tablets (0.125 mg total) by mouth daily as needed for anxiety.   Ascorbic Acid  (VITAMIN C) 1000 MG tablet Take 1,000 mg by mouth daily.   atorvastatin (LIPITOR) 40 MG tablet TAKE 1 TABLET BY MOUTH AT  BEDTIME   B-D 3CC LUER-LOK SYR 25GX1" 25G X 1" 3 ML MISC USE TO INJECT  CYANOCOBALAMIN ONCE MONTHLY   butalbital-acetaminophen-caffeine (FIORICET) 50-325-40 MG tablet TAKE 1 TO 2 TABLETS BY MOUTH EVERY 6 HOURS AS NEEDED FOR HEADACHES(S). MANUFACTURER RECOMMENDS NOT EXCEEDING 6 TABLETS PER DAY.   Cholecalciferol (VITAMIN D-3 PO) Take 1 capsule by mouth daily.   cyanocobalamin (,VITAMIN B-12,) 1000 MCG/ML injection INJECT INTRAMUSCULARLY 1ML  EVERY 30 DAYS (DISCARD 28  DAYS AFTER FIRST USE)   dexamethasone (DECADRON) 1 MG tablet Take 1 mg by mouth once.   docusate sodium (COLACE) 100 MG capsule Take 100 mg by mouth 2 (two) times daily as needed for mild constipation or moderate constipation.   folic acid (FOLVITE) 1 MG tablet Take 1 mg by mouth daily.   furosemide (LASIX) 20 MG tablet TAKE 1 TABLET (20 MG TOTAL) BY MOUTH DAILY AS NEEDED FOR EDEMA.   HYDROcodone-acetaminophen (NORCO/VICODIN) 5-325 MG tablet Take 1 tablet by mouth every 6 (six) hours as needed for moderate pain.   irbesartan (AVAPRO) 150 MG tablet TAKE 1 TABLET BY MOUTH  DAILY   lidocaine-prilocaine (EMLA) cream Apply 1 application. topically as needed.   meclizine (ANTIVERT) 12.5 MG tablet Take 1 tablet (12.5 mg total) by mouth 3 (three) times daily as needed for dizziness.   mupirocin ointment (BACTROBAN) 2 % Apply to nasal area 1-2 times daily   ondansetron (ZOFRAN) 8 MG tablet Take 8 mg by mouth every 8 (eight) hours as needed for nausea or vomiting.   pantoprazole (PROTONIX) 40 MG tablet Take 1 tablet (40 mg total) by mouth daily before breakfast.   PARoxetine (PAXIL) 20 MG tablet TAKE 3 TABLETS BY MOUTH IN THE  MORNING   polyethylene glycol powder (GLYCOLAX/MIRALAX) 17 GM/SCOOP powder Take 17 g by mouth daily as needed for mild constipation.   SUMAtriptan (IMITREX) 100 MG tablet Take 1 tablet (100 mg total)  by mouth 2 (two) times daily as needed. At least 2 hrs between doses as needed bid   triamcinolone cream (KENALOG) 0.1 % SMARTSIG:1 Application Topical 2-3 Times Daily   valACYclovir (VALTREX) 500 MG tablet Take 1 tablet (500 mg total) by mouth daily.   vitamin E 180 MG (400 UNITS) capsule Take 400 Units by mouth daily.     Allergies:   Latex   Social History   Tobacco Use   Smoking status: Never   Smokeless tobacco: Never  Vaping Use   Vaping Use: Never used  Substance Use Topics   Alcohol use: No   Drug use: No     Family History: The patient's family history includes Lung cancer (age of onset: 5) in her mother; Prostate cancer (age of onset: 23) in her brother. There is no history of Bladder Cancer or Kidney cancer.  ROS:   Please see the history of present illness.     (+) Tachycardia (+) Chest Pain (+) Shortness  of Breath (+) Edema (+) Rash (+) Hearing Loss  All other systems reviewed and are negative.  EKGs/Labs/Other Studies Reviewed:    The following studies were reviewed today:  Bilateral LE Venous Doppler 01/28/2022: Summary:  BILATERAL:  - No evidence of deep vein thrombosis seen in the lower extremities,  bilaterally.  - No evidence of superficial venous thrombosis in the lower extremities,  bilaterally.  -No evidence of popliteal cyst, bilaterally.     LEFT:  - Left common femoral vein demonstrates deep venous reflux with normal  respiration.   Echo 01/20/2022:  1. Left ventricular ejection fraction, by estimation, is 65 to 70%. Left  ventricular ejection fraction by 3D volume is 66 %. The left ventricle has  normal function. The left ventricle has no regional wall motion  abnormalities. There is mild left  ventricular hypertrophy of the basal-septal segment. Left ventricular  diastolic parameters are consistent with Grade I diastolic dysfunction  (impaired relaxation).   2. Right ventricular systolic function is normal. The right ventricular   size is normal. There is normal pulmonary artery systolic pressure. The  estimated right ventricular systolic pressure is 08.6 mmHg.   3. Left atrial size was mildly dilated.   4. The mitral valve is degenerative. Trivial mitral valve regurgitation.  No evidence of mitral stenosis. Severe mitral annular calcification.   5. The aortic valve is calcified. Aortic valve regurgitation is mild.  Mild to moderate aortic valve stenosis. Aortic valve area, by VTI measures  1.63 cm. Aortic valve mean gradient measures 16.0 mmHg. Aortic valve Vmax  measures 2.64 m/s. DVI 0.36.   6. Aortic dilatation noted. There is borderline dilatation of the  ascending aorta, measuring 37 mm.   7. The inferior vena cava is normal in size with greater than 50%  respiratory variability, suggesting right atrial pressure of 3 mmHg.   8. Compared to study dated 03/20/2021, the mean AVG and VMax are only  minimal increased from prior echo but the DVI has decreaesd from 0.54 to  0.36.   CT Chest 12/03/2021 ([place]): FINDINGS:   Thoracic inlet/central airways: Thyroid normal. Airway patent.  Mediastinum/hila/axilla: Fluid density along the anterior mediastinum between the ascending thoracic aorta and the sternum is again noted unchanged.Marland Kitchen  Heart/vessels: Normal heart size. No pericardial effusion. Aorta normal in caliber and appearance. No central pulmonary embolism.  Lungs/pleura: No evidence of pneumothorax. Development of a small right-sided pleural effusion. Fibrotic changes in the right lower lobe medially right middle lobe right upper lobe, associated with traction bronchiectasis is unchanged. No suspicious mass or associated nodule.  Upper abdomen: Unremarkable.  Chest wall/MSK: Changes of left mastectomy and left axillary node dissection..    CONCLUSION:    1. Overall stable findings. Large area of scarring bronchiectasis within the right lung is unchanged. Infiltrative soft tissue within the  mediastinum, probably sequela of treated tumor unchanged.   CT Chest 04/18/2021: FINDINGS: Cardiovascular: Limited due to the lack of IV contrast. Atherosclerotic calcifications of the aorta are noted. Pericardial effusion is seen measuring up to 3 cm thickness. No cardiac enlargement is noted. Coronary calcifications are noted. No enlargement of the pulmonary artery is seen.   Mediastinum/Nodes: Thoracic inlet is within normal limits. Small hiatal hernia is noted. The esophagus is otherwise within normal limits. Scattered small likely reactive lymph nodes are noted within the mediastinum.   Lungs/Pleura: Small left pleural effusion is noted. No focal infiltrate is seen. Large right-sided pleural effusion is noted with lower lobe consolidation and  some upper lobe consolidation as well. The effusion has increased when compared with the prior PET-CT. Persistent right upper lobe lesion is noted laterally.   Upper Abdomen: Visualized upper abdomen shows no acute abnormality.   Musculoskeletal: Degenerative changes of the thoracic spine are noted. Fractures of the right fifth and sixth ribs are noted laterally stable from prior exams.   IMPRESSION: Enlarging right-sided pleural effusion. The degree of consolidation seen on the right has progressed slightly particularly in the lower lobe related to the enlarging effusion.   Large pericardial effusion.   Stable right upper lobe nodule similar to that seen on prior PET-CT.   Small hiatal hernia.   Aortic Atherosclerosis (ICD10-I70.0).  Monitor 03/2021: Patch Wear Time:  14 days and 0 hours (2022-02-15T15:09:43-0500 to 2022-03-01T15:09:43-0500)   Patient had a min HR of 45 bpm, max HR of 185 bpm, and avg HR of 66 bpm. Predominant underlying rhythm was Sinus Rhythm. Slight P wave morphology changes were noted. 15 Supraventricular Tachycardia runs occurred, the run with the fastest interval lasting  13 beats with a max rate of 185  bpm, the longest lasting 15 beats with an avg rate of 165 bpm. Junctional Rhythm was present. Junctional Rhythm was detected within +/- 45 seconds of symptomatic patient event(s). Isolated SVEs were rare (<1.0%), SVE  Couplets were rare (<1.0%), and SVE Triplets were rare (<1.0%). Isolated VEs were rare (<1.0%), and no VE Couplets or VE Triplets were present. Ventricular Bigeminy was present.  Lexiscan myoview 12/14/2020: The left ventricular ejection fraction is hyperdynamic (>65%). Nuclear stress EF: 82%. There was no ST segment deviation noted during stress. The study is normal. This is a low risk study.   Normal resting and stress perfusion. No ischemia or infarction EF 82%   EKG:  EKG is personally reviewed. 08/22/22: Sinus Tachycardia, rate 103 bpm 02/07/2022: Sinus rhythm. Poor R wave progression.   Imaging studies that I have independently reviewed today: n/a  Recent Labs: No results found for requested labs within last 365 days.   Recent Lipid Panel    Component Value Date/Time   CHOL 181 02/27/2020 0932   TRIG 159.0 (H) 02/27/2020 0932   HDL 38.60 (L) 02/27/2020 0932   CHOLHDL 5 02/27/2020 0932   VLDL 31.8 02/27/2020 0932   LDLCALC 111 (H) 02/27/2020 0932    Physical Exam:    VS:  BP 110/68   Pulse (!) 103   Ht 5' 3"  (1.6 m)   Wt 164 lb (74.4 kg)   SpO2 97%   BMI 29.05 kg/m     Wt Readings from Last 5 Encounters:  08/22/22 164 lb (74.4 kg)  07/23/22 168 lb 8 oz (76.4 kg)  02/07/22 158 lb 9.6 oz (71.9 kg)  01/27/22 164 lb 6.1 oz (74.6 kg)  08/19/21 151 lb (68.5 kg)    Constitutional: No acute distress Eyes: sclera non-icteric, normal conjunctiva and lids ENMT: normal dentition, moist mucous membranes Cardiovascular: regular rhythm, normal rate, 2/6 mid peaking systolic ejection murmur. S1 and S2 normal. No jugular venous distention.  Respiratory: clear to auscultation bilaterally GI : normal bowel sounds, soft and nontender. No distention.   MSK:  extremities warm, well perfused. No edema. Varicose veins of bilateral LE. NEURO: grossly nonfocal exam, moves all extremities. PSYCH: alert and oriented x 3, normal mood and affect.   ASSESSMENT:    1. S/P pericardial window creation   2. Pericardial effusion   3. Aortic valve stenosis, etiology of cardiac valve disease unspecified   4.  Dyspnea, unspecified type   5. Palpitations   6. Thymoma -- managed by [facility] - extensive documentation in Care Everywhere   7. Essential hypertension     PLAN:    Dyspnea - stable overall, somewhat due to deconditioning.   S/P pericardial window creation  Pericardial effusion -no recurrence of effusion on echo.   Aortic valve stenosis, etiology of cardiac valve disease unspecified  -mild-mod AS on echo - repeat echo in 6 mo.  Palpitations-no current symptoms, improved with treatment of iron deficiency anemia.  Thymoma -- managed by [facility] - extensive documentation in Care Everywhere  Essential hypertension-blood pressure grossly stable today, continue irbesartan 150 mg daily.   Follow-up: 6 months.  Total time of encounter: 30 minutes total time of encounter, including 25 minutes spent in face-to-face patient care on the date of this encounter. This time includes coordination of care and counseling regarding above mentioned problem list. Remainder of non-face-to-face time involved reviewing chart documents/testing relevant to the patient encounter and documentation in the medical record. I have independently reviewed documentation from referring provider.   [name], MD, [place]   Shared Decision Making/Informed Consent:       Medication Adjustments/Labs and Tests Ordered: Current medicines are reviewed at length with the patient today.  Concerns regarding medicines are outlined above.   Orders Placed This Encounter  Procedures   EKG 12-Lead   ECHOCARDIOGRAM COMPLETE    No orders of the defined types  were placed in this encounter.   Patient Instructions  Medication Instructions:  No Changes In Medications at this time.  *If you need a refill on your cardiac medications before your next appointment, please call your pharmacy*  Testing/Procedures: Your physician has requested that you have an echocardiogram IN 6 MONTHS. Echocardiography is a painless test that uses sound waves to create images of your heart. It provides your doctor with information about the size and shape of your heart and how well your heart's chambers and valves are working. You may receive an ultrasound enhancing agent through an IV if needed to better visualize your heart during the echo.This procedure takes approximately one hour. There are no restrictions for this procedure. This will take place at the [address], [address].   Follow-Up: At [facility], you and your health needs are our priority.  As part of our continuing mission to provide you with exceptional heart care, we have created designated Provider Care Teams.  These Care Teams include your primary Cardiologist (physician) and Advanced Practice Providers (APPs -  Physician Assistants and Nurse Practitioners) who all work together to provide you with the care you need, when you need it.  Your next appointment:   6 month(s) RIGHT AFTER ECHO  The format for your next appointment:   In Person  Provider:   [name], MD     Other Instructions PLEASE [place] TO USE THIS AS NEEDED FOR PALPITATIONS          I,Mary Mosetta Pigeon Buren,acting as a scribe for [name], MD.,have documented all relevant documentation on the behalf of [name], MD,as directed by  [name], MD while in the presence of [name], MD.   I, [name], MD, have reviewed all documentation for the visit on 08/22/2022. The documentation on today's date of service for the exam,  diagnosis, procedures, and orders are all accurate and complete.

## 2022-09-04 ENCOUNTER — Telehealth: Payer: Self-pay | Admitting: Pharmacist

## 2022-09-04 NOTE — Progress Notes (Signed)
Chronic Care Management Pharmacy Assistant   Name: Autumn Conley  MRN: [id number] DOB: September 02, 1946   Reason for Encounter: General Adherence Call    Recent office visits:  07/23/2022 OV (PCP) [name], PA; Will trial prn lasix 20 mg   07/21/2022 OV (Urgent Care) Freddie Apley, FNP; triamcinolone 0.1% cream.  07/16/2022 OV (Urgent Care) Glenford Peers Carlyle Dolly, PA-C; We will start Valtrex 1 g 3 times daily for 7 days. We will start gabapentin 300 mg at bedtime   Recent consult visits:  08/22/2022 OV (Cardiology) [name], MD; no medication changes noted.  08/19/2022 OV (Hem/Onc) [name], MD; continue alimta, no medication changes.  07/29/2022 OV (Hem/Onc) Schleutker, Alferd Patee, PA-C; continue alimta, no medication changes.  06/17/2022 OV (Hem/Onc) [name], MD; no further information available.  05/27/2022 OV (Hem/Onc) [name], MD; no further information available.  Hospital visits:  None in previous 6 months  Medications: Outpatient Encounter Medications as of 09/04/2022  Medication Sig   acetaminophen (TYLENOL) 500 MG tablet Take by mouth.   ALPRAZolam (XANAX) 0.25 MG tablet Take 0.5 tablets (0.125 mg total) by mouth daily as needed for anxiety.   Ascorbic Acid (VITAMIN C) 1000 MG tablet Take 1,000 mg by mouth daily.   atorvastatin (LIPITOR) 40 MG tablet TAKE 1 TABLET BY MOUTH AT  BEDTIME   B-D 3CC LUER-LOK SYR 25GX1" 25G X 1" 3 ML MISC USE TO INJECT  CYANOCOBALAMIN ONCE MONTHLY   butalbital-acetaminophen-caffeine (FIORICET) 50-325-40 MG tablet TAKE 1 TO 2 TABLETS BY MOUTH EVERY 6 HOURS AS NEEDED FOR HEADACHES(S). MANUFACTURER RECOMMENDS NOT EXCEEDING 6 TABLETS PER DAY.   Cholecalciferol (VITAMIN D-3 PO) Take 1 capsule by mouth daily.   cyanocobalamin (,VITAMIN B-12,) 1000 MCG/ML injection INJECT INTRAMUSCULARLY 1ML  EVERY 30 DAYS (DISCARD 28  DAYS AFTER FIRST USE)   dexamethasone (DECADRON) 1 MG tablet Take 1  mg by mouth once.   docusate sodium (COLACE) 100 MG capsule Take 100 mg by mouth 2 (two) times daily as needed for mild constipation or moderate constipation.   folic acid (FOLVITE) 1 MG tablet Take 1 mg by mouth daily.   furosemide (LASIX) 20 MG tablet TAKE 1 TABLET (20 MG TOTAL) BY MOUTH DAILY AS NEEDED FOR EDEMA.   HYDROcodone-acetaminophen (NORCO/VICODIN) 5-325 MG tablet Take 1 tablet by mouth every 6 (six) hours as needed for moderate pain.   irbesartan (AVAPRO) 150 MG tablet TAKE 1 TABLET BY MOUTH  DAILY   lidocaine-prilocaine (EMLA) cream Apply 1 application. topically as needed.   meclizine (ANTIVERT) 12.5 MG tablet Take 1 tablet (12.5 mg total) by mouth 3 (three) times daily as needed for dizziness.   mupirocin ointment (BACTROBAN) 2 % Apply to nasal area 1-2 times daily   ondansetron (ZOFRAN) 8 MG tablet Take 8 mg by mouth every 8 (eight) hours as needed for nausea or vomiting.   pantoprazole (PROTONIX) 40 MG tablet Take 1 tablet (40 mg total) by mouth daily before breakfast.   PARoxetine (PAXIL) 20 MG tablet TAKE 3 TABLETS BY MOUTH IN THE  MORNING   polyethylene glycol powder (GLYCOLAX/MIRALAX) 17 GM/SCOOP powder Take 17 g by mouth daily as needed for mild constipation.   SUMAtriptan (IMITREX) 100 MG tablet Take 1 tablet (100 mg total) by mouth 2 (two) times daily as needed. At least 2 hrs between doses as needed bid   triamcinolone cream (KENALOG) 0.1 % SMARTSIG:1 Application Topical 2-3 Times Daily   valACYclovir (VALTREX) 500 MG tablet Take 1  tablet (500 mg total) by mouth daily.   vitamin E 180 MG (400 UNITS) capsule Take 400 Units by mouth daily.   No facility-administered encounter medications on file as of 09/04/2022.   Contacted [name] Reason for General Review Call   Chart Review:  Have there been any documented new, changed, or discontinued medications since last visit?  (If yes, include name, dose, frequency, date) Has there been any documented recent hospitalizations or  ED visits since last visit with Clinical Pharmacist? \ Brief Summary (including medication and/or Diagnosis changes):   Adherence Review:  Does the Clinical Pharmacist Assistant have access to adherence rates?  Adherence rates for STAR metric medications (List medication(s)/day supply/ last 2 fill dates). Adherence rates for medications indicated for disease state being reviewed (List medication(s)/day supply/ last 2 fill dates). Does the patient have >5 day gap between last estimated fill dates for any of the above medications or other medication gaps?  Reason for medication gaps.   Disease State Questions:  Able to connect with Patient?  Did patient have any problems with their health recently?  Note problems and Concerns: Have you had any admissions or emergency room visits or worsening of your condition(s) since last visit?  Details of ED visit, hospital visit and/or worsening condition(s): Have you had any visits with new specialists or providers since your last visit?  Explain: Have you had any new health care problem(s) since your last visit?  New problem(s) reported: Have you run out of any of your medications since you last spoke with clinical pharmacist?  What caused you to run out of your medications? Are there any medications you are not taking as prescribed?  What kept you from taking your medications as prescribed? Are you having any issues or side effects with your medications?  Note of issues or side effects: Do you have any other health concerns or questions you want to discuss with your Clinical Pharmacist before your next visit?  Note additional concerns and questions from Patient. Are there any health concerns that you feel we can do a better job addressing?  Note Patient's response. Are you having any problems with any of the following since the last visit: (select all that apply)    Details: 12. Any falls since last visit?   Details: 13. Any increased or  uncontrolled pain since last visit?   Details:   **Unsuccessful attempt at reaching patient to complete this call.**   Care Gaps: Medicare Annual Wellness: Scheduled 09/30/2022 Hemoglobin A1C: 6.1% on 11/30/2014 Colonoscopy: Completed 05/03/2020 Dexa Scan: Completed  Future Appointments  Date Time Provider [place]  09/30/2022  9:30 AM [facility] HEALTH COACH [facility]  02/02/2023 10:30 AM [facility] ECHO [id number]  02/16/2023  1:20 PM [name], MD [facility] None  03/16/2023  3:00 PM [facility] CCM PHARMACIST [facility]   Star Rating Drugs: Atorvastatin 40 mg last filled 08/07/2022 90 DS Irbesartan 150 mg last filled 08/25/2022 90 DS  April D Calhoun, [place] Pharmacist Assistant [phone]

## 2022-09-09 DIAGNOSIS — R600 Localized edema: Secondary | ICD-10-CM | POA: Diagnosis not present

## 2022-09-09 DIAGNOSIS — Z95828 Presence of other vascular implants and grafts: Secondary | ICD-10-CM | POA: Diagnosis not present

## 2022-09-09 DIAGNOSIS — C37 Malignant neoplasm of thymus: Secondary | ICD-10-CM | POA: Diagnosis not present

## 2022-09-09 DIAGNOSIS — R918 Other nonspecific abnormal finding of lung field: Secondary | ICD-10-CM | POA: Diagnosis not present

## 2022-09-09 DIAGNOSIS — D4989 Neoplasm of unspecified behavior of other specified sites: Secondary | ICD-10-CM | POA: Diagnosis not present

## 2022-09-17 ENCOUNTER — Other Ambulatory Visit: Payer: Self-pay | Admitting: Physician Assistant

## 2022-09-19 DIAGNOSIS — D4989 Neoplasm of unspecified behavior of other specified sites: Secondary | ICD-10-CM | POA: Diagnosis not present

## 2022-09-22 DIAGNOSIS — Z95828 Presence of other vascular implants and grafts: Secondary | ICD-10-CM | POA: Diagnosis not present

## 2022-09-22 DIAGNOSIS — C37 Malignant neoplasm of thymus: Secondary | ICD-10-CM | POA: Diagnosis not present

## 2022-09-22 DIAGNOSIS — Z5111 Encounter for antineoplastic chemotherapy: Secondary | ICD-10-CM | POA: Diagnosis not present

## 2022-09-29 DIAGNOSIS — R21 Rash and other nonspecific skin eruption: Secondary | ICD-10-CM | POA: Diagnosis not present

## 2022-09-29 DIAGNOSIS — T7840XA Allergy, unspecified, initial encounter: Secondary | ICD-10-CM | POA: Diagnosis not present

## 2022-09-30 ENCOUNTER — Ambulatory Visit: Payer: Medicare Other

## 2022-10-16 ENCOUNTER — Telehealth: Payer: Self-pay | Admitting: *Deleted

## 2022-10-16 NOTE — Patient Outreach (Signed)
  Care Coordination   10/16/2022 Name: Melis Trochez MRN: [id number] DOB: 1946/05/31   Care Coordination Outreach Attempts:  An unsuccessful telephone outreach was attempted today to offer the patient information about available care coordination services as a benefit of their health plan.   Follow Up Plan:  Additional outreach attempts will be made to offer the patient care coordination information and services.   Encounter Outcome:  No Answer   Care Coordination Interventions:  No, not indicated    Raina Mina, RN Care Management Coordinator [place] Office [phone]

## 2022-10-17 DIAGNOSIS — J984 Other disorders of lung: Secondary | ICD-10-CM | POA: Diagnosis not present

## 2022-10-17 DIAGNOSIS — J189 Pneumonia, unspecified organism: Secondary | ICD-10-CM | POA: Diagnosis not present

## 2022-10-17 DIAGNOSIS — J90 Pleural effusion, not elsewhere classified: Secondary | ICD-10-CM | POA: Diagnosis not present

## 2022-10-23 DIAGNOSIS — J189 Pneumonia, unspecified organism: Secondary | ICD-10-CM | POA: Diagnosis not present

## 2022-10-27 DIAGNOSIS — J189 Pneumonia, unspecified organism: Secondary | ICD-10-CM | POA: Diagnosis not present

## 2022-10-27 DIAGNOSIS — C7901 Secondary malignant neoplasm of right kidney and renal pelvis: Secondary | ICD-10-CM | POA: Diagnosis not present

## 2022-10-27 DIAGNOSIS — C50011 Malignant neoplasm of nipple and areola, right female breast: Secondary | ICD-10-CM | POA: Diagnosis not present

## 2022-10-27 DIAGNOSIS — R000 Tachycardia, unspecified: Secondary | ICD-10-CM | POA: Diagnosis not present

## 2022-10-28 DIAGNOSIS — I4510 Unspecified right bundle-branch block: Secondary | ICD-10-CM | POA: Diagnosis not present

## 2022-10-29 ENCOUNTER — Telehealth: Payer: Self-pay | Admitting: Physician Assistant

## 2022-10-29 NOTE — Telephone Encounter (Signed)
Copied from [place] [id number]. Topic: Medicare AWV >> Oct 29, 2022  9:22 AM Gillis Santa wrote: Reason for CRM: LVM PATIENT TO CALL [phone] SCHEDULE AWV WITH [facility]

## 2022-11-04 DIAGNOSIS — C383 Malignant neoplasm of mediastinum, part unspecified: Secondary | ICD-10-CM | POA: Diagnosis not present

## 2022-11-04 DIAGNOSIS — Z95828 Presence of other vascular implants and grafts: Secondary | ICD-10-CM | POA: Diagnosis not present

## 2022-11-04 DIAGNOSIS — Z853 Personal history of malignant neoplasm of breast: Secondary | ICD-10-CM | POA: Diagnosis not present

## 2022-11-04 DIAGNOSIS — R0602 Shortness of breath: Secondary | ICD-10-CM | POA: Diagnosis not present

## 2022-11-04 DIAGNOSIS — C37 Malignant neoplasm of thymus: Secondary | ICD-10-CM | POA: Diagnosis not present

## 2022-11-04 DIAGNOSIS — R058 Other specified cough: Secondary | ICD-10-CM | POA: Diagnosis not present

## 2022-11-04 DIAGNOSIS — R093 Abnormal sputum: Secondary | ICD-10-CM | POA: Diagnosis not present

## 2022-11-13 DIAGNOSIS — J189 Pneumonia, unspecified organism: Secondary | ICD-10-CM | POA: Diagnosis not present

## 2022-11-13 DIAGNOSIS — I517 Cardiomegaly: Secondary | ICD-10-CM | POA: Diagnosis not present

## 2022-12-02 ENCOUNTER — Telehealth: Payer: Self-pay | Admitting: Physician Assistant

## 2022-12-02 NOTE — Telephone Encounter (Signed)
Copied from [place] [phone]. Topic: Medicare AWV >> Dec 02, 2022 12:53 PM Gillis Santa wrote: Reason for CRM: LVM PATIENT CALL KAREN [phone] [place]

## 2022-12-16 DIAGNOSIS — K2289 Other specified disease of esophagus: Secondary | ICD-10-CM | POA: Diagnosis not present

## 2022-12-16 DIAGNOSIS — K3189 Other diseases of stomach and duodenum: Secondary | ICD-10-CM | POA: Diagnosis not present

## 2022-12-16 DIAGNOSIS — C37 Malignant neoplasm of thymus: Secondary | ICD-10-CM | POA: Diagnosis not present

## 2022-12-16 DIAGNOSIS — R918 Other nonspecific abnormal finding of lung field: Secondary | ICD-10-CM | POA: Diagnosis not present

## 2022-12-16 DIAGNOSIS — D4989 Neoplasm of unspecified behavior of other specified sites: Secondary | ICD-10-CM | POA: Diagnosis not present

## 2022-12-21 ENCOUNTER — Other Ambulatory Visit: Payer: Self-pay | Admitting: Family Medicine

## 2022-12-21 DIAGNOSIS — I10 Essential (primary) hypertension: Secondary | ICD-10-CM

## 2022-12-29 ENCOUNTER — Telehealth: Payer: Self-pay | Admitting: Pharmacist

## 2022-12-29 NOTE — Progress Notes (Unsigned)
Care Management & Coordination Services Pharmacy Team  Reason for Encounter: General adherence update   Contacted patient for general health update and medication adherence call.  {US HC Outreach:28874}   What concerns do you have about your medications?  The patient {denies/reports:25180} side effects with their medications.   How often do you forget or accidentally miss a dose? {missed doses:25554}  Do you use a pillbox? {yes/no:20286}  Are you having any problems getting your medications from your pharmacy? {yes/no:20286}  Has the cost of your medications been a concern? {yes/no:20286} If yes, what medication and is patient assistance available or has it been applied for?  Since last visit with PharmD, {no/thefollowing:25210} interventions have been made.   The patient {has/has not:25209} had an ED visit since last contact.   The patient {denies/reports:25180} problems with their health.   Patient {denies/reports:25180} concerns or questions for ***, PharmD at this time.   Counseled patient on: {GENERALCOUNSELING:28686}   Chart Updates:  Recent office visits:  09/29/2022 OV (Urgent Care) Reva Bores, FNP I prescribed Decadron 10 mg IM and prednisone 20 mg twice daily to be taken along with Zyrtec and Benadryl.   Recent consult visits:  None with available [facility] visits:  None in previous 6 months  Medications: Outpatient Encounter Medications as of 12/29/2022  Medication Sig   acetaminophen (TYLENOL) 500 MG tablet Take by mouth.   ALPRAZolam (XANAX) 0.25 MG tablet Take 0.5 tablets (0.125 mg total) by mouth daily as needed for anxiety.   Ascorbic Acid (VITAMIN C) 1000 MG tablet Take 1,000 mg by mouth daily.   atorvastatin (LIPITOR) 40 MG tablet TAKE 1 TABLET BY MOUTH AT  BEDTIME   B-D 3CC LUER-LOK SYR 25GX1" 25G X 1" 3 ML MISC USE TO INJECT  CYANOCOBALAMIN ONCE MONTHLY   butalbital-acetaminophen-caffeine (FIORICET) 50-325-40 MG tablet TAKE 1 TO 2  TABLETS BY MOUTH EVERY 6 HOURS AS NEEDED FOR HEADACHES(S). MANUFACTURER RECOMMENDS NOT EXCEEDING 6 TABLETS PER DAY.   Cholecalciferol (VITAMIN D-3 PO) Take 1 capsule by mouth daily.   cyanocobalamin (,VITAMIN B-12,) 1000 MCG/ML injection INJECT INTRAMUSCULARLY 1ML  EVERY 30 DAYS (DISCARD 28  DAYS AFTER FIRST USE)   dexamethasone (DECADRON) 1 MG tablet Take 1 mg by mouth once.   docusate sodium (COLACE) 100 MG capsule Take 100 mg by mouth 2 (two) times daily as needed for mild constipation or moderate constipation.   folic acid (FOLVITE) 1 MG tablet Take 1 mg by mouth daily.   furosemide (LASIX) 20 MG tablet TAKE 1 TABLET (20 MG TOTAL) BY MOUTH DAILY AS NEEDED FOR EDEMA.   HYDROcodone-acetaminophen (NORCO/VICODIN) 5-325 MG tablet Take 1 tablet by mouth every 6 (six) hours as needed for moderate pain.   irbesartan (AVAPRO) 150 MG tablet TAKE 1 TABLET BY MOUTH DAILY   lidocaine-prilocaine (EMLA) cream Apply 1 application. topically as needed.   meclizine (ANTIVERT) 12.5 MG tablet Take 1 tablet (12.5 mg total) by mouth 3 (three) times daily as needed for dizziness.   mupirocin ointment (BACTROBAN) 2 % Apply to nasal area 1-2 times daily   ondansetron (ZOFRAN) 8 MG tablet Take 8 mg by mouth every 8 (eight) hours as needed for nausea or vomiting.   pantoprazole (PROTONIX) 40 MG tablet Take 1 tablet (40 mg total) by mouth daily before breakfast.   PARoxetine (PAXIL) 20 MG tablet TAKE 3 TABLETS BY MOUTH IN THE  MORNING   polyethylene glycol powder (GLYCOLAX/MIRALAX) 17 GM/SCOOP powder Take 17 g by mouth daily as needed  for mild constipation.   SUMAtriptan (IMITREX) 100 MG tablet Take 1 tablet (100 mg total) by mouth 2 (two) times daily as needed. At least 2 hrs between doses as needed bid   triamcinolone cream (KENALOG) 0.1 % SMARTSIG:1 Application Topical 2-3 Times Daily   valACYclovir (VALTREX) 500 MG tablet TAKE 1 TABLET BY MOUTH DAILY   vitamin E 180 MG (400 UNITS) capsule Take 400 Units by mouth  daily.   No facility-administered encounter medications on file as of 12/29/2022.    Recent vitals BP Readings from Last 3 Encounters:  08/22/22 110/68  07/23/22 128/70  02/07/22 120/80   Pulse Readings from Last 3 Encounters:  08/22/22 (!) 103  07/23/22 (!) 105  02/07/22 96   Wt Readings from Last 3 Encounters:  08/22/22 164 lb (74.4 kg)  07/23/22 168 lb 8 oz (76.4 kg)  02/07/22 158 lb 9.6 oz (71.9 kg)   BMI Readings from Last 3 Encounters:  08/22/22 29.05 kg/m  07/23/22 29.85 kg/m  02/07/22 28.09 kg/m    Recent lab results    Component Value Date/Time   NA 136 04/25/2021 0500   NA 139 04/09/2021 0000   K 4.3 04/25/2021 0500   CL 98 04/25/2021 0500   CO2 31 04/25/2021 0500   GLUCOSE 103 (H) 04/25/2021 0500   BUN 11 04/25/2021 0500   BUN 14 04/09/2021 0000   CREATININE 0.65 04/25/2021 0500   CREATININE 0.88 08/29/2020 0951   CALCIUM 9.2 04/25/2021 0500    Lab Results  Component Value Date   CREATININE 0.65 04/25/2021   GFR 66.15 05/01/2020   EGFR 83 04/09/2021   GFRNONAA >60 04/25/2021   GFRAA >60 05/08/2020   Lab Results  Component Value Date/Time   HGBA1C 6.1 (H) 11/30/2014 05:26 AM    Lab Results  Component Value Date   CHOL 181 02/27/2020   HDL 38.60 (L) 02/27/2020   LDLCALC 111 (H) 02/27/2020   TRIG 159.0 (H) 02/27/2020   CHOLHDL 5 02/27/2020    Care Gaps: Annual wellness visit in last year? No  Star Rating Drugs:  Atorvastatin 40 mg last filled 10/12/2022 90 DS Irbesartan 150 mg last filled 10/27/2022 90 DS   Future Appointments  Date Time Provider [place]  02/02/2023 10:30 AM [id number]  02/16/2023  1:20 PM [name], MD [facility] None  03/16/2023  1:00 PM Edythe Clarity, [place] None   April D Calhoun, [place] Pharmacist Assistant [phone]

## 2023-01-16 DIAGNOSIS — H04123 Dry eye syndrome of bilateral lacrimal glands: Secondary | ICD-10-CM | POA: Diagnosis not present

## 2023-01-27 DIAGNOSIS — D4989 Neoplasm of unspecified behavior of other specified sites: Secondary | ICD-10-CM | POA: Diagnosis not present

## 2023-01-27 DIAGNOSIS — Z5111 Encounter for antineoplastic chemotherapy: Secondary | ICD-10-CM | POA: Diagnosis not present

## 2023-01-27 DIAGNOSIS — C37 Malignant neoplasm of thymus: Secondary | ICD-10-CM | POA: Diagnosis not present

## 2023-01-29 DIAGNOSIS — G4733 Obstructive sleep apnea (adult) (pediatric): Secondary | ICD-10-CM | POA: Diagnosis not present

## 2023-02-02 ENCOUNTER — Ambulatory Visit (HOSPITAL_COMMUNITY): Payer: Medicare Other | Attending: Internal Medicine

## 2023-02-02 DIAGNOSIS — I350 Nonrheumatic aortic (valve) stenosis: Secondary | ICD-10-CM | POA: Diagnosis not present

## 2023-02-02 DIAGNOSIS — I3139 Other pericardial effusion (noninflammatory): Secondary | ICD-10-CM | POA: Diagnosis not present

## 2023-02-02 LAB — ECHOCARDIOGRAM COMPLETE
AR max vel: 1.05 cm2
AV Area VTI: 1.24 cm2
AV Area mean vel: 1.17 cm2
AV Mean grad: 28 mmHg
AV Peak grad: 46 mmHg
Ao pk vel: 3.39 m/s
Area-P 1/2: 2.5 cm2
MV VTI: 2.2 cm2
P 1/2 time: 640 msec
S' Lateral: 2.4 cm

## 2023-02-03 ENCOUNTER — Other Ambulatory Visit: Payer: Self-pay | Admitting: Physician Assistant

## 2023-02-16 ENCOUNTER — Encounter: Payer: Self-pay | Admitting: Internal Medicine

## 2023-02-16 ENCOUNTER — Ambulatory Visit: Payer: Medicare Other | Attending: Internal Medicine

## 2023-02-16 ENCOUNTER — Ambulatory Visit: Payer: Medicare Other | Attending: Internal Medicine | Admitting: Internal Medicine

## 2023-02-16 VITALS — BP 128/76 | HR 100 | Ht 63.0 in | Wt 161.2 lb

## 2023-02-16 DIAGNOSIS — I10 Essential (primary) hypertension: Secondary | ICD-10-CM | POA: Diagnosis not present

## 2023-02-16 DIAGNOSIS — R002 Palpitations: Secondary | ICD-10-CM

## 2023-02-16 DIAGNOSIS — Z9889 Other specified postprocedural states: Secondary | ICD-10-CM

## 2023-02-16 DIAGNOSIS — D649 Anemia, unspecified: Secondary | ICD-10-CM | POA: Diagnosis not present

## 2023-02-16 DIAGNOSIS — R0600 Dyspnea, unspecified: Secondary | ICD-10-CM | POA: Diagnosis not present

## 2023-02-16 DIAGNOSIS — D4989 Neoplasm of unspecified behavior of other specified sites: Secondary | ICD-10-CM

## 2023-02-16 DIAGNOSIS — I350 Nonrheumatic aortic (valve) stenosis: Secondary | ICD-10-CM | POA: Diagnosis not present

## 2023-02-16 DIAGNOSIS — I3139 Other pericardial effusion (noninflammatory): Secondary | ICD-10-CM

## 2023-02-16 NOTE — Progress Notes (Unsigned)
Enrolled for [employer] to mail a ZIO XT long term holter monitor to the patients address on file.  

## 2023-02-16 NOTE — Progress Notes (Signed)
Cardiology Office Note:    Date:  02/16/2023   ID:  Linley Moskal, DOB 05/28/1946, MRN [id number]  PCP:  Jarold Motto, PA  Cardiologist:  Parke Poisson, MD  Electrophysiologist:  None   Referring MD: Jarold Motto, PA   Chief Complaint/Reason for Referral: Follow up pericardial effusion s/p pericardial window  History of Present Illness:    Autumn Conley is a 77 y.o. female with a history of metastatic thyoma being followed by Oncology, Hx of Pagets disease of breast, HTN, HLD, anxiety, IBS, NASH, fibromyalgia who presents for follow up of generalized weakness, dizziness, shortness of breath with exertion. Symptomatic anemia related to malignancy, treated with iron supplementation with IV iron.  Status post pericardial window July 2022 for symptomatic pericardial effusion. She has been found to have recurrent disease on surveillance imaging related to metastatic thymoma and is undergoing chemotherapy intravenously with Dr. Loney Hering through [facility].  She is following closely with oncology.  Today: She continues to feel poorly with shortness of breath on exertion and palpitations.  She records her heart rhythm on a monitor but I am not sure how she can send these recordings to me.  Monitor records tachycardia at a heart rate of 150 bpm at times.  I believe this probably represents her known SVT but we have concerns about detecting atrial fibrillation given her many risk factors.  She has a pleuritic sounding chest discomfort with deep inspiration, but has been coughing for about the same duration 1 month.  Echocardiogram reviewed with no pericardial effusion, suspect it is related to musculoskeletal pain.  She will observe this and report back at her next follow-up. Reviewed echo, no pericardial effusion and moderate AS, compared to prior echo with mean gradient 23 mmHg now 28 mmHg, so no significant change but will follow with echo in 1 year.   Last visit, she endorsed mild  fatigue and shortness of breath, which she attributes to her oncologic illness. She also complained of significant edema in her right leg, as well as soreness, rash and erythematous skin.  She had an episode of tachycardia at 140-150 bpm which lasted 30 minutes. She has had tachycardic episodes since, but they have all been less than one minute.  She still suffers from shortness of breath, but it has improved significantly. Her chest pain is stable. She expects it to continue, because she has been told it is caused by nerve damage and scar tissue.  She still has swelling and redness in her lower extremities. She has been told it is a reaction to the chemo, and that the symptoms will subside eventually.  Her hearing has become significantly worse.  She denies any lightheadedness, headaches, syncope, orthopnea, or PND.    Past Medical History:  Diagnosis Date   Anemia    Anxiety    Breast cancer (HCC)    Depression    Dyspnea    Fibromyalgia    "some; not chronic" (11/29/2014)   GERD (gastroesophageal reflux disease)    Glaucoma of both eyes    Headache    Hypertension    IBS (irritable bowel syndrome)    Mitral valve prolapse    NASH (nonalcoholic steatohepatitis)    Osteoarthritis    PONV (postoperative nausea and vomiting)    Stroke (HCC)    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[facility])     Past Surgical History:  Procedure Laterality Date   ABDOMINAL HYSTERECTOMY  1980   APPENDECTOMY  1953   BIOPSY  05/03/2020  Procedure: BIOPSY;  Surgeon: Iva Boop, MD;  Location: [facility] ENDOSCOPY;  Service: Endoscopy;;   BREAST BIOPSY Left    BREAST LUMPECTOMY Left    CATARACT EXTRACTION, BILATERAL  2018   COLONOSCOPY WITH PROPOFOL N/A 05/03/2020   Procedure: COLONOSCOPY WITH PROPOFOL;  Surgeon: Iva Boop, MD;  Location: [facility] ENDOSCOPY;  Service: Endoscopy;  Laterality: N/A;   ESOPHAGOGASTRODUODENOSCOPY (EGD) WITH PROPOFOL N/A 05/03/2020   Procedure: ESOPHAGOGASTRODUODENOSCOPY (EGD) WITH PROPOFOL;   Surgeon: Iva Boop, MD;  Location: [facility] ENDOSCOPY;  Service: Endoscopy;  Laterality: N/A;   INTERCOSTAL NERVE BLOCK Right 04/19/2021   Procedure: INTERCOSTAL NERVE BLOCK;  Surgeon: Corliss Skains, MD;  Location: [facility] OR;  Service: Thoracic;  Laterality: Right;   MASTECTOMY Left ~ 2009   PARASTERNAL EXPLORATION Right 02/14/2019   Procedure: PARASTERNAL MEDIAL EXPLORATION WITH BIOPIES.;  Surgeon: Delight Ovens, MD;  Location: [facility] OR;  Service: Thoracic;  Laterality: Right;   POLYPECTOMY  05/03/2020   Procedure: POLYPECTOMY;  Surgeon: Iva Boop, MD;  Location: [facility] ENDOSCOPY;  Service: Endoscopy;;    Current Medications: Current Meds  Medication Sig   acetaminophen (TYLENOL) 500 MG tablet Take by mouth.   ALPRAZolam (XANAX) 0.25 MG tablet Take 0.5 tablets (0.125 mg total) by mouth daily as needed for anxiety.   Ascorbic Acid (VITAMIN C) 1000 MG tablet Take 1,000 mg by mouth daily.   atorvastatin (LIPITOR) 40 MG tablet TAKE 1 TABLET BY MOUTH AT  BEDTIME   B-D 3CC LUER-LOK SYR 25GX1" 25G X 1" 3 ML MISC USE TO INJECT  CYANOCOBALAMIN ONCE MONTHLY   butalbital-acetaminophen-caffeine (FIORICET) 50-325-40 MG tablet TAKE 1 TO 2 TABLETS BY MOUTH EVERY 6 HOURS AS NEEDED FOR HEADACHES(S). MANUFACTURER RECOMMENDS NOT EXCEEDING 6 TABLETS PER DAY.   Cholecalciferol (VITAMIN D-3 PO) Take 1 capsule by mouth daily.   cyanocobalamin (,VITAMIN B-12,) 1000 MCG/ML injection INJECT INTRAMUSCULARLY  EVERY 30 DAYS (DISCARD 28  DAYS AFTER FIRST USE)   dexamethasone (DECADRON) 1 MG tablet Take 1 mg by mouth once.   docusate sodium (COLACE) 100 MG capsule Take 100 mg by mouth 2 (two) times daily as needed for mild constipation or moderate constipation.   folic acid (FOLVITE) 1 MG tablet Take 1 mg by mouth daily.   furosemide (LASIX) 20 MG tablet TAKE 1 TABLET (20 MG TOTAL) BY MOUTH DAILY AS NEEDED FOR EDEMA.   HYDROcodone-acetaminophen (NORCO/VICODIN) 5-325 MG tablet Take 1 tablet by mouth every 6 (six)  hours as needed for moderate pain.   irbesartan (AVAPRO) 150 MG tablet TAKE 1 TABLET BY MOUTH DAILY   lidocaine-prilocaine (EMLA) cream Apply 1 application. topically as needed.   meclizine (ANTIVERT) 12.5 MG tablet Take 1 tablet (12.5 mg total) by mouth 3 (three) times daily as needed for dizziness.   mupirocin ointment (BACTROBAN) 2 % Apply to nasal area 1-2 times daily   ondansetron (ZOFRAN) 8 MG tablet Take 8 mg by mouth every 8 (eight) hours as needed for nausea or vomiting.   pantoprazole (PROTONIX) 40 MG tablet Take 1 tablet (40 mg total) by mouth daily before breakfast.   PARoxetine (PAXIL) 20 MG tablet TAKE 3 TABLETS BY MOUTH IN THE  MORNING   polyethylene glycol powder (GLYCOLAX/MIRALAX) 17 GM/SCOOP powder Take 17 g by mouth daily as needed for mild constipation.   SUMAtriptan (IMITREX) 100 MG tablet Take 1 tablet (100 mg total) by mouth 2 (two) times daily as needed. At least 2 hrs between doses as needed bid   triamcinolone cream (  KENALOG) 0.1 % SMARTSIG:1 Application Topical 2-3 Times Daily   valACYclovir (VALTREX) 500 MG tablet TAKE 1 TABLET BY MOUTH DAILY   vitamin E 180 MG (400 UNITS) capsule Take 400 Units by mouth daily.     Allergies:   Latex   Social History   Tobacco Use   Smoking status: Never   Smokeless tobacco: Never  Vaping Use   Vaping Use: Never used  Substance Use Topics   Alcohol use: No   Drug use: No     Family History: The patient's family history includes Lung cancer (age of onset: 46) in her mother; Prostate cancer (age of onset: 32) in her brother. There is no history of Bladder Cancer or Kidney cancer.  ROS:   Please see the history of present illness.     All other systems reviewed and are negative.  EKGs/Labs/Other Studies Reviewed:    The following studies were reviewed today:  Bilateral LE Venous Doppler 01/28/2022: Summary:  BILATERAL:  - No evidence of deep vein thrombosis seen in the lower extremities,  bilaterally.  - No  evidence of superficial venous thrombosis in the lower extremities,  bilaterally.  -No evidence of popliteal cyst, bilaterally.     LEFT:  - Left common femoral vein demonstrates deep venous reflux with normal  respiration.   Echo 01/20/2022:  1. Left ventricular ejection fraction, by estimation, is 65 to 70%. Left  ventricular ejection fraction by 3D volume is 66 %. The left ventricle has  normal function. The left ventricle has no regional wall motion  abnormalities. There is mild left  ventricular hypertrophy of the basal-septal segment. Left ventricular  diastolic parameters are consistent with Grade I diastolic dysfunction  (impaired relaxation).   2. Right ventricular systolic function is normal. The right ventricular  size is normal. There is normal pulmonary artery systolic pressure. The  estimated right ventricular systolic pressure is 26.2 mmHg.   3. Left atrial size was mildly dilated.   4. The mitral valve is degenerative. Trivial mitral valve regurgitation.  No evidence of mitral stenosis. Severe mitral annular calcification.   5. The aortic valve is calcified. Aortic valve regurgitation is mild.  Mild to moderate aortic valve stenosis. Aortic valve area, by VTI measures  1.63 cm. Aortic valve mean gradient measures 16.0 mmHg. Aortic valve Vmax  measures 2.64 m/s. DVI 0.36.   6. Aortic dilatation noted. There is borderline dilatation of the  ascending aorta, measuring 37 mm.   7. The inferior vena cava is normal in size with greater than 50%  respiratory variability, suggesting right atrial pressure of 3 mmHg.   8. Compared to study dated 03/20/2021, the mean AVG and VMax are only  minimal increased from prior echo but the DVI has decreaesd from 0.54 to  0.36.   CT Chest 12/03/2021 ([facility]): FINDINGS:   Thoracic inlet/central airways: Thyroid normal. Airway patent.  Mediastinum/hila/axilla: Fluid density along the anterior mediastinum between the ascending  thoracic aorta and the sternum is again noted unchanged.Marland Kitchen  Heart/vessels: Normal heart size. No pericardial effusion. Aorta normal in caliber and appearance. No central pulmonary embolism.  Lungs/pleura: No evidence of pneumothorax. Development of a small right-sided pleural effusion. Fibrotic changes in the right lower lobe medially right middle lobe right upper lobe, associated with traction bronchiectasis is unchanged. No suspicious mass or associated nodule.  Upper abdomen: Unremarkable.  Chest wall/MSK: Changes of left mastectomy and left axillary node dissection..    CONCLUSION:    1. Overall stable  findings. Large area of scarring bronchiectasis within the right lung is unchanged. Infiltrative soft tissue within the mediastinum, probably sequela of treated tumor unchanged.   CT Chest 04/18/2021: FINDINGS: Cardiovascular: Limited due to the lack of IV contrast. Atherosclerotic calcifications of the aorta are noted. Pericardial effusion is seen measuring up to 3 cm thickness. No cardiac enlargement is noted. Coronary calcifications are noted. No enlargement of the pulmonary artery is seen.   Mediastinum/Nodes: Thoracic inlet is within normal limits. Small hiatal hernia is noted. The esophagus is otherwise within normal limits. Scattered small likely reactive lymph nodes are noted within the mediastinum.   Lungs/Pleura: Small left pleural effusion is noted. No focal infiltrate is seen. Large right-sided pleural effusion is noted with lower lobe consolidation and some upper lobe consolidation as well. The effusion has increased when compared with the prior PET-CT. Persistent right upper lobe lesion is noted laterally.   Upper Abdomen: Visualized upper abdomen shows no acute abnormality.   Musculoskeletal: Degenerative changes of the thoracic spine are noted. Fractures of the right fifth and sixth ribs are noted laterally stable from prior exams.   IMPRESSION: Enlarging  right-sided pleural effusion. The degree of consolidation seen on the right has progressed slightly particularly in the lower lobe related to the enlarging effusion.   Large pericardial effusion.   Stable right upper lobe nodule similar to that seen on prior PET-CT.   Small hiatal hernia.   Aortic Atherosclerosis (ICD10-I70.0).  Monitor 03/2021: Patch Wear Time:  14 days and 0 hours (2022-02-15T15:09:43-0500 to 2022-03-01T15:09:43-0500)   Patient had a min HR of 45 bpm, max HR of 185 bpm, and avg HR of 66 bpm. Predominant underlying rhythm was Sinus Rhythm. Slight P wave morphology changes were noted. 15 Supraventricular Tachycardia runs occurred, the run with the fastest interval lasting  13 beats with a max rate of 185 bpm, the longest lasting 15 beats with an avg rate of 165 bpm. Junctional Rhythm was present. Junctional Rhythm was detected within +/- 45 seconds of symptomatic patient event(s). Isolated SVEs were rare (<1.0%), SVE  Couplets were rare (<1.0%), and SVE Triplets were rare (<1.0%). Isolated VEs were rare (<1.0%), and no VE Couplets or VE Triplets were present. Ventricular Bigeminy was present.  Lexiscan myoview 12/14/2020: The left ventricular ejection fraction is hyperdynamic (>65%). Nuclear stress EF: 82%. There was no ST segment deviation noted during stress. The study is normal. This is a low risk study.   Normal resting and stress perfusion. No ischemia or infarction EF 82%  EKG:  EKG is personally reviewed. 02/16/23: n/a 08/22/22: Sinus Tachycardia, rate 103 bpm 02/07/2022: Sinus rhythm. Poor R wave progression.   Imaging studies that I have independently reviewed today: n/a  Recent Labs: No results found for requested labs within last 365 days.   Recent Lipid Panel    Component Value Date/Time   CHOL 181 02/27/2020 0932   TRIG 159.0 (H) 02/27/2020 0932   HDL 38.60 (L) 02/27/2020 0932   CHOLHDL 5 02/27/2020 0932   VLDL 31.8 02/27/2020 0932   LDLCALC  111 (H) 02/27/2020 0932    Physical Exam:    VS:  BP 128/76   Pulse 100   Ht 5\' 3"  (1.6 m)   Wt 161 lb 3.2 oz (73.1 kg)   SpO2 93%   BMI 28.56 kg/m     Wt Readings from Last 5 Encounters:  02/16/23 161 lb 3.2 oz (73.1 kg)  08/22/22 164 lb (74.4 kg)  07/23/22 168 lb 8 oz (76.4  kg)  02/07/22 158 lb 9.6 oz (71.9 kg)  01/27/22 164 lb 6.1 oz (74.6 kg)    Constitutional: No acute distress Eyes: sclera non-icteric, normal conjunctiva and lids ENMT: normal dentition, moist mucous membranes Cardiovascular: regular rhythm, normal rate, 2/6 mid peaking systolic ejection murmur. S1 and S2 normal. No jugular venous distention.  Respiratory: clear to auscultation bilaterally GI : normal bowel sounds, soft and nontender. No distention.   MSK: extremities warm, well perfused. No edema. Varicose veins of bilateral LE. NEURO: grossly nonfocal exam, moves all extremities. PSYCH: alert and oriented x 3, normal mood and affect.   ASSESSMENT:    1. Dyspnea, unspecified type   2. S/P pericardial window creation   3. Aortic valve stenosis, etiology of cardiac valve disease unspecified   4. Pericardial effusion   5. Palpitations   6. Thymoma -- managed by [facility] - extensive documentation in Care Everywhere   7. Essential hypertension   8. Anemia, unspecified type    PLAN:    Dyspnea -Continues to be short of breath with exertion, this is likely contributed to by multiple risk factors including ongoing treatment for cancer with chemotherapy, persistent anemia managed by heme-onc, and overall deconditioning.    S/P pericardial window creation  Pericardial effusion -no recurrence of effusion on echo.  Personally reviewed.  Pleuritic chest pain likely related to musculoskeletal pain.  Could consider checking inflammatory markers for recurrent pericarditis, however I would not be certain how to interpret these in the setting of active chemotherapy, defer to oncology if these can be reasonably  interpreted.  Aortic valve stenosis, etiology of cardiac valve disease unspecified  -moderate AS, gradient 28 mmHg.  Recheck echo in 1 year.  Palpitations Anemia - Continued palpitations, improved with treatment of iron deficiency anemia previously, but now recurrent.  Patient concerned about episodes of tachycardia with fatigue and we will screen again for atrial fibrillation with cardiac monitor, 2-week monitor ordered.  Hb low but overall stable over time. Managed by Heme/Onc. -Discussed medication therapy for palpitations, she did not feel metoprolol helped significantly.  May want to avoid diltiazem if there are medication interactions with current chemotherapy. -Unlikely to be active inflammation as the patient takes daily Decadron 1 mg.  Thymoma -- managed by [facility] - extensive documentation in Care Everywhere  Essential hypertension-blood pressure grossly stable today, continue irbesartan 150 mg daily.   Total time of encounter: 35 minutes total time of encounter, including 30 minutes spent in face-to-face patient care on the date of this encounter. This time includes coordination of care and counseling regarding above mentioned problem list. Remainder of non-face-to-face time involved reviewing chart documents/testing relevant to the patient encounter and documentation in the medical record. I have independently reviewed documentation from referring provider.   Weston Brass, MD, [facility] [place]  [facility]   Shared Decision Making/Informed Consent:       Medication Adjustments/Labs and Tests Ordered: Current medicines are reviewed at length with the patient today.  Concerns regarding medicines are outlined above.   Orders Placed This Encounter  Procedures   LONG TERM MONITOR (3-14 DAYS)    No orders of the defined types were placed in this encounter.   Patient Instructions  Medication Instructions:  No Changes In Medications at this time.  *If you need a refill  on your cardiac medications before your next appointment, please call your pharmacy*  Testing/Procedures:  ZIO XT- Long Term Monitor Instructions   Your physician has requested you wear your ZIO patch monitor____14___days.  This is a single patch monitor.  [employer] supplies one patch monitor per enrollment.  Additional stickers are not available.   Please do not apply patch if you will be having a Nuclear Stress Test, Echocardiogram, Cardiac CT, MRI, or Chest Xray during the time frame you would be wearing the monitor. The patch cannot be worn during these tests.  You cannot remove and re-apply the ZIO XT patch monitor.   Your ZIO patch monitor will be sent USPS Priority mail from [facility] directly to your home address. The monitor may also be mailed to a PO BOX if home delivery is not available.   It may take 3-5 days to receive your monitor after you have been enrolled.   Once you have received you monitor, please review enclosed instructions.  Your monitor has already been registered assigning a specific monitor serial # to you.   Applying the monitor   Shave hair from upper left chest.   Hold abrader disc by orange tab.  Rub abrader in 40 strokes over left upper chest as indicated in your monitor instructions.   Clean area with 4 enclosed alcohol pads .  Use all pads to assure are is cleaned thoroughly.  Let dry.   Apply patch as indicated in monitor instructions.  Patch will be place under collarbone on left side of chest with arrow pointing upward.   Rub patch adhesive wings for 2 minutes.Remove white label marked "1".  Remove white label marked "2".  Rub patch adhesive wings for 2 additional minutes.   While looking in a mirror, press and release button in center of patch.  A small green light will flash 3-4 times .  This will be your only indicator the monitor has been turned on.     Do not shower for the first 24 hours.  You may shower after the first 24 hours.    Press button if you feel a symptom. You will hear a small click.  Record Date, Time and Symptom in the Patient Log Book.   When you are ready to remove patch, follow instructions on last 2 pages of Patient Log Book.  Stick patch monitor onto last page of Patient Log Book.   Place Patient Log Book in [place] box.  Use locking tab on box and tape box closed securely.  The [employer] and [employer] has [employer] on it.  Please place in mailbox as soon as possible.  Your physician should have your test results approximately 7 days after the monitor has been mailed back to [facility].   Call [facility] Customer Care at [phone] if you have questions regarding your ZIO XT patch monitor.  Call them immediately if you see an orange light blinking on your monitor.   If your monitor falls off in less than 4 days contact our Monitor department at [phone].  If your monitor becomes loose or falls off after 4 days call [employer] at [phone] for suggestions on securing your monitor.   Follow-Up: At [facility], you and your health needs are our priority.  As part of our continuing mission to provide you with exceptional heart care, we have created designated Provider Care Teams.  These Care Teams include your primary Cardiologist (physician) and Advanced Practice Providers (APPs -  Physician Assistants and Nurse Practitioners) who all work together to provide you with the care you need, when you need it.  Your next appointment:   3 month(s)  Provider:   Gennie Alma  Jacques Navy, MD

## 2023-02-16 NOTE — Patient Instructions (Signed)
Medication Instructions:  No Changes In Medications at this time.  *If you need a refill on your cardiac medications before your next appointment, please call your pharmacy*  Testing/Procedures:  ZIO XT- Long Term Monitor Instructions   Your physician has requested you wear your ZIO patch monitor____14___days.   This is a single patch monitor.  [employer] supplies one patch monitor per enrollment.  Additional stickers are not available.   Please do not apply patch if you will be having a Nuclear Stress Test, Echocardiogram, Cardiac CT, MRI, or Chest Xray during the time frame you would be wearing the monitor. The patch cannot be worn during these tests.  You cannot remove and re-apply the ZIO XT patch monitor.   Your ZIO patch monitor will be sent USPS Priority mail from [facility] directly to your home address. The monitor may also be mailed to a PO BOX if home delivery is not available.   It may take 3-5 days to receive your monitor after you have been enrolled.   Once you have received you monitor, please review enclosed instructions.  Your monitor has already been registered assigning a specific monitor serial # to you.   Applying the monitor   Shave hair from upper left chest.   Hold abrader disc by orange tab.  Rub abrader in 40 strokes over left upper chest as indicated in your monitor instructions.   Clean area with 4 enclosed alcohol pads .  Use all pads to assure are is cleaned thoroughly.  Let dry.   Apply patch as indicated in monitor instructions.  Patch will be place under collarbone on left side of chest with arrow pointing upward.   Rub patch adhesive wings for 2 minutes.Remove white label marked "1".  Remove white label marked "2".  Rub patch adhesive wings for 2 additional minutes.   While looking in a mirror, press and release button in center of patch.  A small green light will flash 3-4 times .  This will be your only indicator the monitor has been turned  on.     Do not shower for the first 24 hours.  You may shower after the first 24 hours.   Press button if you feel a symptom. You will hear a small click.  Record Date, Time and Symptom in the Patient Log Book.   When you are ready to remove patch, follow instructions on last 2 pages of Patient Log Book.  Stick patch monitor onto last page of Patient Log Book.   Place Patient Log Book in [place] box.  Use locking tab on box and tape box closed securely.  The [employer] and [employer] has [employer] on it.  Please place in mailbox as soon as possible.  Your physician should have your test results approximately 7 days after the monitor has been mailed back to [facility].   Call [facility] Customer Care at [phone] if you have questions regarding your ZIO XT patch monitor.  Call them immediately if you see an orange light blinking on your monitor.   If your monitor falls off in less than 4 days contact our Monitor department at [phone].  If your monitor becomes loose or falls off after 4 days call [employer] at [phone] for suggestions on securing your monitor.   Follow-Up: At [facility], you and your health needs are our priority.  As part of our continuing mission to provide you with exceptional heart care, we have created designated Provider Care Teams.  These Care Teams include your primary Cardiologist (physician) and Advanced Practice Providers (APPs -  Physician Assistants and Nurse Practitioners) who all work together to provide you with the care you need, when you need it.  Your next appointment:   3 month(s)  Provider:   Parke Poisson, MD

## 2023-02-18 DIAGNOSIS — R002 Palpitations: Secondary | ICD-10-CM | POA: Diagnosis not present

## 2023-02-19 ENCOUNTER — Telehealth: Payer: Self-pay | Admitting: Physician Assistant

## 2023-02-19 NOTE — Telephone Encounter (Signed)
Copied from CRM [id number]. Topic: Medicare AWV >> Feb 19, 2023 10:31 AM Gwenith Spitz wrote: Reason for CRM: Called patient to schedule Medicare Annual Wellness Visit (AWV). Left message for patient to call back and schedule Medicare Annual Wellness Visit (AWV).  Last date of AWV: 09/17/2021  Please schedule an appointment at any time with Inetta Fermo, [facility]. Please schedule AWVS with Inetta Fermo, [facility].  If any questions, please contact me at [phone].  Thank you ,  Gabriel [facility] AWV TEAM Direct Dial [phone]

## 2023-02-19 NOTE — Telephone Encounter (Signed)
Contacted Autumn Conley to schedule their annual wellness visit. Patient declined to schedule AWV at this time.  Per patient she is no longer a patient at [facility].  [facility]riel [facility] AWV TEAM Direct Dial [phone]

## 2023-03-06 ENCOUNTER — Telehealth: Payer: Self-pay | Admitting: Pharmacist

## 2023-03-06 NOTE — Progress Notes (Signed)
Care Management & Coordination Services Pharmacy Team  Reason for Encounter: Appointment Reminder  Contacted patient to confirm telephone appointment with Erskine Emery, PharmD on 03/09/2023 at 1 pm. Unsuccessful outreach. Left voicemail for patient to return call.   Star Rating Drugs:  Atorvastatin 40 mg last filled 01/01/2023 90 DS Irbesartan 150 mg last filled 01/01/2023 90 DS   Care Gaps: Annual wellness visit in last year? No  Future Appointments  Date Time Provider Department Center  03/09/2023  1:00 PM Erroll Luna [facility] None  05/29/2023  8:40 AM Parke Poisson, MD [facility] None   April D Calhoun, [facility] Clinical Pharmacist Assistant [phone]

## 2023-03-09 ENCOUNTER — Encounter: Payer: Medicare Other | Admitting: Pharmacist

## 2023-03-09 NOTE — Progress Notes (Incomplete)
Care Management & Coordination Services Pharmacy Note  03/09/2023 Name:  Autumn Conley MRN:  [id number] DOB:  [birth year]  Summary: ***  Recommendations/Changes made from today's visit: ***  Follow up plan: ***   Subjective: Autumn Conley is an 77 y.o. year old female who is a primary patient of No primary care provider on file..  The care coordination team was consulted for assistance with disease management and care coordination needs.    Engaged with patient by telephone for follow up visit.  Recent office visits:  09/29/2022 OV (Urgent Care) Marinell Blight, FNP I prescribed Decadron 10 mg IM and prednisone 20 mg twice daily to be taken along with Zyrtec and Benadryl.    Recent consult visits:  None with available OV notes   Hospital visits:  None in previous 6 months   Objective:  Lab Results  Component Value Date   CREATININE 0.65 04/25/2021   BUN 11 04/25/2021   GFR 66.15 05/01/2020   EGFR 83 04/09/2021   GFRNONAA >60 04/25/2021   GFRAA >60 05/08/2020   NA 136 04/25/2021   K 4.3 04/25/2021   CALCIUM 9.2 04/25/2021   CO2 31 04/25/2021   GLUCOSE 103 (H) 04/25/2021    Lab Results  Component Value Date/Time   HGBA1C 6.1 (H) 11/30/2014 05:26 AM   GFR 66.15 05/01/2020 02:05 PM   GFR 74.29 02/27/2020 09:32 AM    Last diabetic Eye exam: No results found for: "HMDIABEYEEXA"  Last diabetic Foot exam: No results found for: "HMDIABFOOTEX"   Lab Results  Component Value Date   CHOL 181 02/27/2020   HDL 38.60 (L) 02/27/2020   LDLCALC 111 (H) 02/27/2020   TRIG 159.0 (H) 02/27/2020   CHOLHDL 5 02/27/2020       Latest Ref Rng & Units 04/21/2021    2:21 AM 08/29/2020    9:51 AM 07/02/2020   12:03 PM  Hepatic Function  Total Protein 6.5 - 8.1 g/dL 5.4  6.8  6.7   Albumin 3.5 - 5.0 g/dL 2.8     AST 15 - 41 U/L 19  27  72   ALT 0 - 44 U/L 24  24  55   Alk Phosphatase 38 - 126 U/L 92     Total Bilirubin 0.3 - 1.2 mg/dL 0.7  0.4  0.4     Lab Results   Component Value Date/Time   TSH 2.120 12/27/2020 02:29 PM   TSH 2.14 05/01/2020 02:05 PM   FREET4 0.70 12/08/2018 12:15 PM       Latest Ref Rng & Units 04/25/2021    5:00 AM 04/23/2021    4:25 AM 04/21/2021    2:21 AM  CBC  WBC 4.0 - 10.5 K/uL 5.5  4.9  8.6   Hemoglobin 12.0 - 15.0 g/dL 40.9  81.1  9.4   Hematocrit 36.0 - 46.0 % 34.1  31.2  28.3   Platelets 150 - 400 K/uL 290  240  250     Lab Results  Component Value Date/Time   VD25OH 36.03 02/27/2020 09:32 AM   VD25OH 38.37 07/28/2019 08:02 AM   VITAMINB12 239 02/27/2020 09:32 AM   VITAMINB12 >1500 (H) 07/28/2019 08:02 AM    Clinical ASCVD: {YES/NO:21197} The ASCVD Risk score (Arnett DK, et al., 2019) failed to calculate for the following reasons:   The patient has a prior MI or stroke diagnosis    ***Other: (CHADS2VASc if Afib, MMRC or CAT for COPD, ACT, DEXA)     09/17/2021  9:34 AM 02/20/2021   11:36 AM 12/08/2018   11:33 AM  Depression screen PHQ 2/9  Decreased Interest 0 0 0  Down, Depressed, Hopeless 0 0 0  PHQ - 2 Score 0 0 0  Altered sleeping   0  Tired, decreased energy   1  Change in appetite   0  Feeling bad or failure about yourself    0  Trouble concentrating   0  Moving slowly or fidgety/restless   0  Suicidal thoughts   0  PHQ-9 Score   1  Difficult doing work/chores   Not difficult at all     Social History   Tobacco Use  Smoking Status Never  Smokeless Tobacco Never   BP Readings from Last 3 Encounters:  02/16/23 128/76  08/22/22 110/68  07/23/22 128/70   Pulse Readings from Last 3 Encounters:  02/16/23 100  08/22/22 (!) 103  07/23/22 (!) 105   Wt Readings from Last 3 Encounters:  02/16/23 161 lb 3.2 oz (73.1 kg)  08/22/22 164 lb (74.4 kg)  07/23/22 168 lb 8 oz (76.4 kg)   BMI Readings from Last 3 Encounters:  02/16/23 28.56 kg/m  08/22/22 29.05 kg/m  07/23/22 29.85 kg/m    Allergies  Allergen Reactions   Latex Rash and Other (See Comments)    Blisters, also     Medications Reviewed Today     Reviewed by Missy Sabins, CMA (Certified Medical Assistant) on 02/16/23 at 1313  Med List Status: <None>   Medication Order Taking? Sig Documenting Provider Last Dose Status Informant  acetaminophen (TYLENOL) 500 MG tablet [id number] Yes Take by mouth. [provider] Taking Active   ALPRAZolam Prudy Feeler) 0.25 MG tablet [id number] Yes Take 0.5 tablets (0.125 mg total) by mouth daily as needed for anxiety. Jarold Motto, [place] Taking Active Self  Ascorbic Acid (VITAMIN C) 1000 MG tablet [id number] Yes Take 1,000 mg by mouth daily. [provider] Taking Active Self  atorvastatin (LIPITOR) 40 MG tablet [id number] Yes TAKE 1 TABLET BY MOUTH AT  BEDTIME Jarold Motto, PA Taking Active   B-D 3CC LUER-LOK SYR 25GX1" 25G X 1" 3 ML MISC [id number] Yes USE TO INJECT  CYANOCOBALAMIN ONCE Ranee Gosselin, [place], [place] Taking Active   butalbital-acetaminophen-caffeine (FIORICET) 50-325-40 MG tablet [id number] Yes TAKE 1 TO 2 TABLETS BY MOUTH EVERY 6 HOURS AS NEEDED FOR HEADACHES(S). MANUFACTURER RECOMMENDS NOT EXCEEDING 6 TABLETS PER DAY. Jarold Motto, [place] Taking Active   Cholecalciferol (VITAMIN D-3 PO) [id number] Yes Take 1 capsule by mouth daily. [provider] Taking Active Self  cyanocobalamin (,VITAMIN B-12,) 1000 MCG/ML injection [id number] Yes INJECT INTRAMUSCULARLY  EVERY 30 DAYS (DISCARD 28  DAYS AFTER FIRST USE) Jarold Motto, PA Taking Active   dexamethasone (DECADRON) 1 MG tablet [id number] Yes Take 1 mg by mouth once. [provider] Taking Active   docusate sodium (COLACE) 100 MG capsule [id number] Yes Take 100 mg by mouth 2 (two) times daily as needed for mild constipation or moderate constipation. [provider] Taking Active Self  folic acid (FOLVITE) 1 MG tablet [id number] Yes Take 1 mg by mouth daily. [provider] Taking Active   furosemide (LASIX) 20 MG tablet [id number] Yes TAKE 1 TABLET (20  MG TOTAL) BY MOUTH DAILY AS NEEDED FOR EDEMA. Jarold Motto, [place] Taking Active   HYDROcodone-acetaminophen (NORCO/VICODIN) 5-325 MG tablet [id number] Yes Take 1 tablet by mouth every 6 (six) hours as needed for moderate pain. [provider] Taking Active   irbesartan (  AVAPRO) 150 MG tablet [id number] Yes TAKE 1 TABLET BY MOUTH DAILY Jarold Motto, [place] Taking Active   lidocaine-prilocaine (EMLA) cream [id number] Yes Apply 1 application. topically as needed. [provider] Taking Active   meclizine (ANTIVERT) 12.5 MG tablet [id number] Yes Take 1 tablet (12.5 mg total) by mouth 3 (three) times daily as needed for dizziness. Jarold Motto, [place] Taking Active   mupirocin ointment (BACTROBAN) 2 % [id number] Yes Apply to nasal area 1-2 times daily [place], [place], [place] Taking Active   ondansetron (ZOFRAN) 8 MG tablet [id number] Yes Take 8 mg by mouth every 8 (eight) hours as needed for nausea or vomiting. [provider] Taking Active Self  pantoprazole (PROTONIX) 40 MG tablet [id number] Yes Take 1 tablet (40 mg total) by mouth daily before breakfast. Merlene Laughter, DO Taking Active Self  PARoxetine (PAXIL) 20 MG tablet [id number] Yes TAKE 3 TABLETS BY MOUTH IN THE  [place], [place], [place] Taking Active   polyethylene glycol powder (GLYCOLAX/MIRALAX) 17 GM/SCOOP powder [id number] Yes Take 17 g by mouth daily as needed for mild constipation. [provider] Taking Active Self  SUMAtriptan (IMITREX) 100 MG tablet [id number] Yes Take 1 tablet (100 mg total) by mouth 2 (two) times daily as needed. At least 2 hrs between doses as needed bid Ardith Dark, MD Taking Active Self  triamcinolone cream (KENALOG) 0.1 % [id number] Yes SMARTSIG:1 Application Topical 2-3 Times Daily [provider] Taking Active   valACYclovir (VALTREX) 500 MG tablet [id number] Yes TAKE 1 TABLET BY MOUTH DAILY Jarold Motto, [place] Taking Active   vitamin E 180 MG (400 UNITS) capsule  [id number] Yes Take 400 Units by mouth daily. [provider] Taking Active Self            SDOH:  (Social Determinants of Health) assessments and interventions performed: {yes/no:20286} SDOH Interventions    Flowsheet Row Office Visit from 12/08/2018 in [facility]  SDOH Interventions   Depression Interventions/Treatment  Medication       Medication Assistance: {MEDASSISTANCEINFO:25044}  Medication Access: Name and location of current pharmacy:  [phone] [place] - [address] [place] [address] Phone: [phone] Fax: [phone]  [facility]) - [place], [place] - [address] [facility] [address] [place] [address] [place] [address] Phone: [phone] Fax: [phone]  [facility] Delivery - [place], [place] - [address] [place] [address] Phone: [phone] Fax: [phone]  Within the past 30 days, how often has patient missed a dose of medication? *** Is a pillbox or other method used to improve adherence? {YES/NO:21197} Factors that may affect medication adherence? {CHL DESC; BARRIERS:21522} Are meds synced by current pharmacy? {YES/NO:21197} Are meds delivered by current pharmacy? {YES/NO:21197} Does patient experience delays in picking up medications due to transportation concerns? {YES/NO:21197}  Compliance/Adherence/Medication fill history: Star Rating Drugs:  Atorvastatin 40 mg last filled 01/01/2023 90 DS Irbesartan 150 mg last filled 01/01/2023 90 DS     Care Gaps: Annual wellness visit in last year? No  Assessment/Plan      Hypertension (BP goal <130/80) -Controlled, consistently at goal at OVs -Current treatment: Irbesartan 150 mg once daily Appropriate, Effective, Safe, Accessible -No home monitoring, has home cuff if needed  -Current dietary habits: low fat diet. Current exercise  habits: starting to increase exercise frequency - hgb had dropped to 6.6 04/2020, improved with iron infxns to 11.6 02/13/2021. Feeling  much better now.  -Denies hypotensive/hypertensive symptoms -Counseled to monitor BP at home as directed, document, and provide log at future appointments -Recommended to continue current medication  Update 08/27/21 Hgb continues to improve - 12.0 at last lab checkup Not checking BP at home, however remains controlled at all of her MD visits Continue current medication  Monitor for s/sx of hypotension  Update 03/11/22 BP has been well controlled recently.  She is not really checking often at home but office BP has been good. She was trying metoprolol to see if this helped SOB, which it did not so she has since stopped taking her metoprolol. SOB is not as frequent as it once was. She denies swelling, wearing compression hose. No changes to meds - continue to monitor swelling and s/sx of hypertension.  Hyperlipidemia: (LDL goal < 100) -Not ideally controlled, last LDL 111, consistently close to goal (+/-) -10 yr ASCVD 16%, primary prevention  -Current treatment: Atorvastatin 40 mg once daily Appropriate, Query effective, ,  -Denies tolerability issues or concerns -Educated on Cholesterol goals; patient to start walking more as tolerated -Recommended to continue current medication  Update 08/27/21 Patient continues to be adherent to medication No cholesterol labs done since 02/2020.  Reminded patient to schedule appointment with Sam ASAP.  She was also due for medicare AWV - scheduiled appt for later this month. Recheck lipids - as long as they remain stable we could continue current medication. Work on exercise as previous.  Update 03/11/22 Continues adherence, however no updated labs since last visit. Reminded patient to call and schedule appointment, she is agreeable. No changes to current medication regimen - continue to monitor lipids and adjust as  needed.   GERD/errosive gastritis (Goal to minimize symptoms) -Controlled, no issues or need for trigger avoidance -Receives B12 injections at home - daughter provides them -Avoids NSAIDs, occasional use of APAP for OA pain.  -Current regimen Pantoprazole 40 mg once daily -Continue current management  Depression (Goal: minimize symptoms) -Controlled -Current treatment: Paroxetine 60 mg once daily -PHQ9: 0. Denies any worsening symptoms or side effects. Feels content with management -Educated on Benefits of medication for symptom control -Recommended to continue current medication  Osteopenia (Goal maintain bone density, ensure optimization of supplements) -Controlled -Last DEXA Scan: 02/2020 - osteopenia w/ plan to repeat in 2 yrs. FRAX score: 10 year major osteoporotic risk: 7.6%. 10 year hip fracture risk: 0.7%. -Long term SSRI and PPI use -Patient is not a candidate for pharmacologic treatment -Current treatment  Vitamin D3 1000 units daily  -Recommend weight-bearing and muscle strengthening exercises for building and maintaining bone density. -Recommended to continue current medication          Willa Frater, PharmD Clinical Pharmacist  [facility] [phone]

## 2023-03-10 DIAGNOSIS — Z5111 Encounter for antineoplastic chemotherapy: Secondary | ICD-10-CM | POA: Diagnosis not present

## 2023-03-10 DIAGNOSIS — D4989 Neoplasm of unspecified behavior of other specified sites: Secondary | ICD-10-CM | POA: Diagnosis not present

## 2023-03-10 DIAGNOSIS — C37 Malignant neoplasm of thymus: Secondary | ICD-10-CM | POA: Diagnosis not present

## 2023-03-16 ENCOUNTER — Encounter: Payer: Medicare Other | Admitting: Pharmacist

## 2023-03-17 DIAGNOSIS — R002 Palpitations: Secondary | ICD-10-CM | POA: Diagnosis not present

## 2023-03-19 DIAGNOSIS — H04123 Dry eye syndrome of bilateral lacrimal glands: Secondary | ICD-10-CM | POA: Diagnosis not present

## 2023-04-02 DIAGNOSIS — Z853 Personal history of malignant neoplasm of breast: Secondary | ICD-10-CM | POA: Diagnosis not present

## 2023-04-02 DIAGNOSIS — Z9889 Other specified postprocedural states: Secondary | ICD-10-CM | POA: Diagnosis not present

## 2023-04-02 DIAGNOSIS — Z9221 Personal history of antineoplastic chemotherapy: Secondary | ICD-10-CM | POA: Diagnosis not present

## 2023-04-02 DIAGNOSIS — R928 Other abnormal and inconclusive findings on diagnostic imaging of breast: Secondary | ICD-10-CM | POA: Diagnosis not present

## 2023-04-02 DIAGNOSIS — Z79631 Long term (current) use of antimetabolite agent: Secondary | ICD-10-CM | POA: Diagnosis not present

## 2023-04-02 DIAGNOSIS — Z9012 Acquired absence of left breast and nipple: Secondary | ICD-10-CM | POA: Diagnosis not present

## 2023-04-02 DIAGNOSIS — C37 Malignant neoplasm of thymus: Secondary | ICD-10-CM | POA: Diagnosis not present

## 2023-04-02 DIAGNOSIS — R92331 Mammographic heterogeneous density, right breast: Secondary | ICD-10-CM | POA: Diagnosis not present

## 2023-04-02 DIAGNOSIS — R921 Mammographic calcification found on diagnostic imaging of breast: Secondary | ICD-10-CM | POA: Diagnosis not present

## 2023-04-02 DIAGNOSIS — Z08 Encounter for follow-up examination after completed treatment for malignant neoplasm: Secondary | ICD-10-CM | POA: Diagnosis not present

## 2023-04-02 DIAGNOSIS — M8580 Other specified disorders of bone density and structure, unspecified site: Secondary | ICD-10-CM | POA: Diagnosis not present

## 2023-04-02 DIAGNOSIS — R234 Changes in skin texture: Secondary | ICD-10-CM | POA: Diagnosis not present

## 2023-04-02 DIAGNOSIS — R2689 Other abnormalities of gait and mobility: Secondary | ICD-10-CM | POA: Diagnosis not present

## 2023-04-06 DIAGNOSIS — H04123 Dry eye syndrome of bilateral lacrimal glands: Secondary | ICD-10-CM | POA: Diagnosis not present

## 2023-04-08 ENCOUNTER — Other Ambulatory Visit: Payer: Self-pay | Admitting: Physician Assistant

## 2023-04-09 ENCOUNTER — Other Ambulatory Visit: Payer: Self-pay | Admitting: Physician Assistant

## 2023-04-15 DIAGNOSIS — I6789 Other cerebrovascular disease: Secondary | ICD-10-CM | POA: Diagnosis not present

## 2023-04-15 DIAGNOSIS — R9082 White matter disease, unspecified: Secondary | ICD-10-CM | POA: Diagnosis not present

## 2023-04-15 DIAGNOSIS — R2689 Other abnormalities of gait and mobility: Secondary | ICD-10-CM | POA: Diagnosis not present

## 2023-04-15 DIAGNOSIS — R9089 Other abnormal findings on diagnostic imaging of central nervous system: Secondary | ICD-10-CM | POA: Diagnosis not present

## 2023-04-15 DIAGNOSIS — R42 Dizziness and giddiness: Secondary | ICD-10-CM | POA: Diagnosis not present

## 2023-04-15 DIAGNOSIS — Z853 Personal history of malignant neoplasm of breast: Secondary | ICD-10-CM | POA: Diagnosis not present

## 2023-04-17 ENCOUNTER — Other Ambulatory Visit: Payer: Self-pay | Admitting: *Deleted

## 2023-04-17 ENCOUNTER — Telehealth: Payer: Self-pay

## 2023-04-17 DIAGNOSIS — I471 Supraventricular tachycardia, unspecified: Secondary | ICD-10-CM

## 2023-04-17 NOTE — Telephone Encounter (Signed)
Patient spoke with nurse Stanton Kidney and is aware of lab results. She does not need any assistance at this time.

## 2023-04-21 DIAGNOSIS — R0789 Other chest pain: Secondary | ICD-10-CM | POA: Diagnosis not present

## 2023-04-21 DIAGNOSIS — N649 Disorder of breast, unspecified: Secondary | ICD-10-CM | POA: Diagnosis not present

## 2023-04-21 DIAGNOSIS — J90 Pleural effusion, not elsewhere classified: Secondary | ICD-10-CM | POA: Diagnosis not present

## 2023-04-21 DIAGNOSIS — Z5111 Encounter for antineoplastic chemotherapy: Secondary | ICD-10-CM | POA: Diagnosis not present

## 2023-04-21 DIAGNOSIS — C37 Malignant neoplasm of thymus: Secondary | ICD-10-CM | POA: Diagnosis not present

## 2023-04-21 DIAGNOSIS — D4989 Neoplasm of unspecified behavior of other specified sites: Secondary | ICD-10-CM | POA: Diagnosis not present

## 2023-04-21 DIAGNOSIS — I471 Supraventricular tachycardia, unspecified: Secondary | ICD-10-CM | POA: Diagnosis not present

## 2023-04-21 DIAGNOSIS — J9811 Atelectasis: Secondary | ICD-10-CM | POA: Diagnosis not present

## 2023-04-21 DIAGNOSIS — Z79631 Long term (current) use of antimetabolite agent: Secondary | ICD-10-CM | POA: Diagnosis not present

## 2023-04-24 DIAGNOSIS — E785 Hyperlipidemia, unspecified: Secondary | ICD-10-CM | POA: Diagnosis not present

## 2023-04-24 DIAGNOSIS — I10 Essential (primary) hypertension: Secondary | ICD-10-CM | POA: Diagnosis not present

## 2023-04-27 DIAGNOSIS — L905 Scar conditions and fibrosis of skin: Secondary | ICD-10-CM | POA: Diagnosis not present

## 2023-04-27 DIAGNOSIS — R234 Changes in skin texture: Secondary | ICD-10-CM | POA: Diagnosis not present

## 2023-05-07 DIAGNOSIS — G4733 Obstructive sleep apnea (adult) (pediatric): Secondary | ICD-10-CM | POA: Diagnosis not present

## 2023-05-14 DIAGNOSIS — H04123 Dry eye syndrome of bilateral lacrimal glands: Secondary | ICD-10-CM | POA: Diagnosis not present

## 2023-05-21 ENCOUNTER — Other Ambulatory Visit: Payer: Self-pay | Admitting: Physician Assistant

## 2023-05-27 DIAGNOSIS — R058 Other specified cough: Secondary | ICD-10-CM | POA: Diagnosis not present

## 2023-05-29 ENCOUNTER — Ambulatory Visit: Payer: Medicare Other | Attending: Internal Medicine | Admitting: Internal Medicine

## 2023-05-29 ENCOUNTER — Other Ambulatory Visit: Payer: Self-pay

## 2023-05-29 ENCOUNTER — Encounter: Payer: Self-pay | Admitting: Internal Medicine

## 2023-05-29 VITALS — BP 102/60 | HR 99 | Ht 63.0 in | Wt 168.8 lb

## 2023-05-29 DIAGNOSIS — I471 Supraventricular tachycardia, unspecified: Secondary | ICD-10-CM | POA: Diagnosis not present

## 2023-05-29 DIAGNOSIS — I10 Essential (primary) hypertension: Secondary | ICD-10-CM | POA: Diagnosis not present

## 2023-05-29 DIAGNOSIS — I350 Nonrheumatic aortic (valve) stenosis: Secondary | ICD-10-CM | POA: Insufficient documentation

## 2023-05-29 DIAGNOSIS — Z9889 Other specified postprocedural states: Secondary | ICD-10-CM | POA: Insufficient documentation

## 2023-05-29 DIAGNOSIS — D649 Anemia, unspecified: Secondary | ICD-10-CM | POA: Insufficient documentation

## 2023-05-29 DIAGNOSIS — R002 Palpitations: Secondary | ICD-10-CM | POA: Insufficient documentation

## 2023-05-29 DIAGNOSIS — I3139 Other pericardial effusion (noninflammatory): Secondary | ICD-10-CM | POA: Insufficient documentation

## 2023-05-29 DIAGNOSIS — D4989 Neoplasm of unspecified behavior of other specified sites: Secondary | ICD-10-CM | POA: Diagnosis not present

## 2023-05-29 DIAGNOSIS — R0600 Dyspnea, unspecified: Secondary | ICD-10-CM | POA: Insufficient documentation

## 2023-05-29 NOTE — Progress Notes (Deleted)
12  

## 2023-05-29 NOTE — Patient Instructions (Signed)
Medication Instructions:  Your physician recommends that you continue on your current medications as directed. Please refer to the Current Medication list given to you today.  *If you need a refill on your cardiac medications before your next appointment, please call your pharmacy*  Lab Work: If you have labs (blood work) drawn today and your tests are completely normal, you will receive your results only by: MyChart Message (if you have MyChart) OR A paper copy in the mail If you have any lab test that is abnormal or we need to change your treatment, we will call you to review the results.  Testing/Procedures: Your physician has requested that you have an echocardiogram in 6 months. Echocardiography is a painless test that uses sound waves to create images of your heart. It provides your doctor with information about the size and shape of your heart and how well your heart's chambers and valves are working. This procedure takes approximately one hour. There are no restrictions for this procedure. Please do NOT wear cologne, perfume, aftershave, or lotions (deodorant is allowed). Please arrive 15 minutes prior to your appointment time.  Follow-Up: At [facility], you and your health needs are our priority.  As part of our continuing mission to provide you with exceptional heart care, we have created designated Provider Care Teams.  These Care Teams include your primary Cardiologist (physician) and Advanced Practice Providers (APPs -  Physician Assistants and Nurse Practitioners) who all work together to provide you with the care you need, when you need it.  We recommend signing up for the patient portal called "MyChart".  Sign up information is provided on this After Visit Summary.  MyChart is used to connect with patients for Virtual Visits (Telemedicine).  Patients are able to view lab/test results, encounter notes, upcoming appointments, etc.  Non-urgent messages can be sent to your  provider as well.   To learn more about what you can do with MyChart, go to [email or web address].    Your next appointment:   6 month(s)  Provider:   Parke Poisson, MD

## 2023-05-29 NOTE — Progress Notes (Signed)
Cardiology Office Note:    Date:  05/29/2023   ID:  Autumn Conley, DOB 1946-06-28, MRN [id number]  PCP:  Virgilio Belling, PA-C  Cardiologist:  Parke Poisson, MD  Electrophysiologist:  None   Referring MD: Jarold Motto, PA   Chief Complaint/Reason for Referral: Follow up pericardial effusion s/p pericardial window  History of Present Illness:    Autumn Conley is a 77 y.o. female with a history of metastatic thyoma being followed by Oncology, Hx of Pagets disease of breast, HTN, HLD, anxiety, IBS, NASH, fibromyalgia who presents for follow up of generalized weakness, dizziness, shortness of breath with exertion. Symptomatic anemia related to malignancy, treated with iron supplementation with IV iron.  Status post pericardial window July 2022 for symptomatic pericardial effusion. She has been found to have recurrent disease on surveillance imaging related to metastatic thymoma and is undergoing chemotherapy intravenously with Dr. Loney Hering through [facility]  She is following closely with oncology.  05/29/23: She has been stable overall from the standpoint of palpitations and is somewhat less symptomatic.  In addition she had COVID 2 weeks ago and despite feeling unwell with sneezing and coughing, had no increase in palpitations.  She is not currently on any AV nodal blocking therapy for treatment of palpitations.  Cardiac monitor reviewed with no definite atrial fibrillation and episodes of SVT.  This is not unexpected with her anemia and current chemotherapy.  However given the longer episodes of atrial tachycardia with junctional bradycardia at night, and relative hypotension during the day, with multiple medical issues, I have requested review by EP for best management moving forward.  Patient continues to short of breath with activity but has not significantly changed.  Currently undergoing chemotherapy with a stable hemoglobin of 9, has completed iron infusions which have  made her symptomatically better for a brief period of time after completion.  Prior visits:  She continues to feel poorly with shortness of breath on exertion and palpitations.  She records her heart rhythm on a monitor but I am not sure how she can send these recordings to me.  Monitor records tachycardia at a heart rate of 150 bpm at times.  I believe this probably represents her known SVT but we have concerns about detecting atrial fibrillation given her many risk factors.  She has a pleuritic sounding chest discomfort with deep inspiration, but has been coughing for about the same duration 1 month.  Echocardiogram reviewed with no pericardial effusion, suspect it is related to musculoskeletal pain.  She will observe this and report back at her next follow-up. Reviewed echo, no pericardial effusion and moderate AS, compared to prior echo with mean gradient 23 mmHg now 28 mmHg, so no significant change but will follow with echo in 1 year.   Last visit, she endorsed mild fatigue and shortness of breath, which she attributes to her oncologic illness. She also complained of significant edema in her right leg, as well as soreness, rash and erythematous skin.  She had an episode of tachycardia at 140-150 bpm which lasted 30 minutes. She has had tachycardic episodes since, but they have all been less than one minute.  She still suffers from shortness of breath, but it has improved significantly. Her chest pain is stable. She expects it to continue, because she has been told it is caused by nerve damage and scar tissue.  She still has swelling and redness in her lower extremities. She has been told it is a reaction to the  chemo, and that the symptoms will subside eventually.  Her hearing has become significantly worse.  She denies any lightheadedness, headaches, syncope, orthopnea, or PND.    Past Medical History:  Diagnosis Date   Anemia    Anxiety    Breast cancer (HCC)    Depression     Dyspnea    Fibromyalgia    "some; not chronic" (11/29/2014)   GERD (gastroesophageal reflux disease)    Glaucoma of both eyes    Headache    Hypertension    IBS (irritable bowel syndrome)    Mitral valve prolapse    NASH (nonalcoholic steatohepatitis)    Osteoarthritis    PONV (postoperative nausea and vomiting)    Stroke (HCC)    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[facility])     Past Surgical History:  Procedure Laterality Date   ABDOMINAL HYSTERECTOMY  1980   APPENDECTOMY  1953   BIOPSY  05/03/2020   Procedure: BIOPSY;  Surgeon: Iva Boop, MD;  Location: [facility] ENDOSCOPY;  Service: Endoscopy;;   BREAST BIOPSY Left    BREAST LUMPECTOMY Left    CATARACT EXTRACTION, BILATERAL  2018   COLONOSCOPY WITH PROPOFOL N/A 05/03/2020   Procedure: COLONOSCOPY WITH PROPOFOL;  Surgeon: Iva Boop, MD;  Location: [facility] ENDOSCOPY;  Service: Endoscopy;  Laterality: N/A;   ESOPHAGOGASTRODUODENOSCOPY (EGD) WITH PROPOFOL N/A 05/03/2020   Procedure: ESOPHAGOGASTRODUODENOSCOPY (EGD) WITH PROPOFOL;  Surgeon: Iva Boop, MD;  Location: [facility] ENDOSCOPY;  Service: Endoscopy;  Laterality: N/A;   INTERCOSTAL NERVE BLOCK Right 04/19/2021   Procedure: INTERCOSTAL NERVE BLOCK;  Surgeon: Corliss Skains, MD;  Location: [facility] OR;  Service: Thoracic;  Laterality: Right;   MASTECTOMY Left ~ 2009   PARASTERNAL EXPLORATION Right 02/14/2019   Procedure: PARASTERNAL MEDIAL EXPLORATION WITH BIOPIES.;  Surgeon: Delight Ovens, MD;  Location: [facility] OR;  Service: Thoracic;  Laterality: Right;   POLYPECTOMY  05/03/2020   Procedure: POLYPECTOMY;  Surgeon: Iva Boop, MD;  Location: [facility] ENDOSCOPY;  Service: Endoscopy;;    Current Medications: Current Meds  Medication Sig   acetaminophen (TYLENOL) 500 MG tablet Take by mouth.   ALPRAZolam (XANAX) 0.25 MG tablet Take 0.5 tablets (0.125 mg total) by mouth daily as needed for anxiety.   atorvastatin (LIPITOR) 40 MG tablet TAKE 1 TABLET BY MOUTH AT  BEDTIME   B-D 3CC LUER-LOK SYR 25GX1" 25G X  1" 3 ML MISC USE TO INJECT  CYANOCOBALAMIN ONCE MONTHLY   butalbital-acetaminophen-caffeine (FIORICET) 50-325-40 MG tablet TAKE 1 TO 2 TABLETS BY MOUTH EVERY 6 HOURS AS NEEDED FOR HEADACHES(S). MANUFACTURER RECOMMENDS NOT EXCEEDING 6 TABLETS PER DAY.   Cholecalciferol (VITAMIN D-3 PO) Take 1 capsule by mouth daily.   cyanocobalamin (,VITAMIN B-12,) 1000 MCG/ML injection INJECT INTRAMUSCULARLY  EVERY 30 DAYS (DISCARD 28  DAYS AFTER FIRST USE)   dexamethasone (DECADRON) 1 MG tablet Take 1 mg by mouth once.   docusate sodium (COLACE) 100 MG capsule Take 100 mg by mouth 2 (two) times daily as needed for mild constipation or moderate constipation.   folic acid (FOLVITE) 1 MG tablet Take 1 mg by mouth daily.   furosemide (LASIX) 20 MG tablet TAKE 1 TABLET (20 MG TOTAL) BY MOUTH DAILY AS NEEDED FOR EDEMA.   HYDROcodone-acetaminophen (NORCO/VICODIN) 5-325 MG tablet Take 1 tablet by mouth every 6 (six) hours as needed for moderate pain.   irbesartan (AVAPRO) 150 MG tablet TAKE 1 TABLET BY MOUTH DAILY   lidocaine-prilocaine (EMLA) cream Apply 1 application. topically as needed.   meclizine (ANTIVERT)  12.5 MG tablet Take 1 tablet (12.5 mg total) by mouth 3 (three) times daily as needed for dizziness.   mupirocin ointment (BACTROBAN) 2 % Apply to nasal area 1-2 times daily   ondansetron (ZOFRAN) 8 MG tablet Take 8 mg by mouth every 8 (eight) hours as needed for nausea or vomiting.   pantoprazole (PROTONIX) 40 MG tablet Take 1 tablet (40 mg total) by mouth daily before breakfast.   PARoxetine (PAXIL) 20 MG tablet TAKE 3 TABLETS BY MOUTH IN THE  MORNING   polyethylene glycol powder (GLYCOLAX/MIRALAX) 17 GM/SCOOP powder Take 17 g by mouth daily as needed for mild constipation.   valACYclovir (VALTREX) 500 MG tablet TAKE 1 TABLET BY MOUTH DAILY   vitamin E 180 MG (400 UNITS) capsule Take 400 Units by mouth daily.     Allergies:   Latex   Social History   Tobacco Use   Smoking status: Never   Smokeless  tobacco: Never  Vaping Use   Vaping status: Never Used  Substance Use Topics   Alcohol use: No   Drug use: No     Family History: The patient's family history includes Lung cancer (age of onset: 52) in her mother; Prostate cancer (age of onset: 74) in her brother. There is no history of Bladder Cancer or Kidney cancer.  ROS:   Please see the history of present illness.     All other systems reviewed and are negative.  EKGs/Labs/Other Studies Reviewed:    The following studies were reviewed today:  Bilateral LE Venous Doppler 01/28/2022: Summary:  BILATERAL:  - No evidence of deep vein thrombosis seen in the lower extremities,  bilaterally.  - No evidence of superficial venous thrombosis in the lower extremities,  bilaterally.  -No evidence of popliteal cyst, bilaterally.     LEFT:  - Left common femoral vein demonstrates deep venous reflux with normal  respiration.   Echo 01/20/2022:  1. Left ventricular ejection fraction, by estimation, is 65 to 70%. Left  ventricular ejection fraction by 3D volume is 66 %. The left ventricle has  normal function. The left ventricle has no regional wall motion  abnormalities. There is mild left  ventricular hypertrophy of the basal-septal segment. Left ventricular  diastolic parameters are consistent with Grade I diastolic dysfunction  (impaired relaxation).   2. Right ventricular systolic function is normal. The right ventricular  size is normal. There is normal pulmonary artery systolic pressure. The  estimated right ventricular systolic pressure is 26.2 mmHg.   3. Left atrial size was mildly dilated.   4. The mitral valve is degenerative. Trivial mitral valve regurgitation.  No evidence of mitral stenosis. Severe mitral annular calcification.   5. The aortic valve is calcified. Aortic valve regurgitation is mild.  Mild to moderate aortic valve stenosis. Aortic valve area, by VTI measures  1.63 cm. Aortic valve mean gradient measures  16.0 mmHg. Aortic valve Vmax  measures 2.64 m/s. DVI 0.36.   6. Aortic dilatation noted. There is borderline dilatation of the  ascending aorta, measuring 37 mm.   7. The inferior vena cava is normal in size with greater than 50%  respiratory variability, suggesting right atrial pressure of 3 mmHg.   8. Compared to study dated 03/20/2021, the mean AVG and VMax are only  minimal increased from prior echo but the DVI has decreaesd from 0.54 to  0.36.   CT Chest 12/03/2021 ([facility]): FINDINGS:   Thoracic inlet/central airways: Thyroid normal. Airway patent.  Mediastinum/hila/axilla: Fluid  density along the anterior mediastinum between the ascending thoracic aorta and the sternum is again noted unchanged.Marland Kitchen  Heart/vessels: Normal heart size. No pericardial effusion. Aorta normal in caliber and appearance. No central pulmonary embolism.  Lungs/pleura: No evidence of pneumothorax. Development of a small right-sided pleural effusion. Fibrotic changes in the right lower lobe medially right middle lobe right upper lobe, associated with traction bronchiectasis is unchanged. No suspicious mass or associated nodule.  Upper abdomen: Unremarkable.  Chest wall/MSK: Changes of left mastectomy and left axillary node dissection..    CONCLUSION:    1. Overall stable findings. Large area of scarring bronchiectasis within the right lung is unchanged. Infiltrative soft tissue within the mediastinum, probably sequela of treated tumor unchanged.   CT Chest 04/18/2021: FINDINGS: Cardiovascular: Limited due to the lack of IV contrast. Atherosclerotic calcifications of the aorta are noted. Pericardial effusion is seen measuring up to 3 cm thickness. No cardiac enlargement is noted. Coronary calcifications are noted. No enlargement of the pulmonary artery is seen.   Mediastinum/Nodes: Thoracic inlet is within normal limits. Small hiatal hernia is noted. The esophagus is otherwise within normal limits.  Scattered small likely reactive lymph nodes are noted within the mediastinum.   Lungs/Pleura: Small left pleural effusion is noted. No focal infiltrate is seen. Large right-sided pleural effusion is noted with lower lobe consolidation and some upper lobe consolidation as well. The effusion has increased when compared with the prior PET-CT. Persistent right upper lobe lesion is noted laterally.   Upper Abdomen: Visualized upper abdomen shows no acute abnormality.   Musculoskeletal: Degenerative changes of the thoracic spine are noted. Fractures of the right fifth and sixth ribs are noted laterally stable from prior exams.   IMPRESSION: Enlarging right-sided pleural effusion. The degree of consolidation seen on the right has progressed slightly particularly in the lower lobe related to the enlarging effusion.   Large pericardial effusion.   Stable right upper lobe nodule similar to that seen on prior PET-CT.   Small hiatal hernia.   Aortic Atherosclerosis (ICD10-I70.0).  Monitor 03/2021: Patch Wear Time:  14 days and 0 hours (2022-02-15T15:09:43-0500 to 2022-03-01T15:09:43-0500)   Patient had a min HR of 45 bpm, max HR of 185 bpm, and avg HR of 66 bpm. Predominant underlying rhythm was Sinus Rhythm. Slight P wave morphology changes were noted. 15 Supraventricular Tachycardia runs occurred, the run with the fastest interval lasting  13 beats with a max rate of 185 bpm, the longest lasting 15 beats with an avg rate of 165 bpm. Junctional Rhythm was present. Junctional Rhythm was detected within +/- 45 seconds of symptomatic patient event(s). Isolated SVEs were rare (<1.0%), SVE  Couplets were rare (<1.0%), and SVE Triplets were rare (<1.0%). Isolated VEs were rare (<1.0%), and no VE Couplets or VE Triplets were present. Ventricular Bigeminy was present.  Lexiscan myoview 12/14/2020: The left ventricular ejection fraction is hyperdynamic (>65%). Nuclear stress EF: 82%. There was no ST  segment deviation noted during stress. The study is normal. This is a low risk study.   Normal resting and stress perfusion. No ischemia or infarction EF 82%  EKG:  EKG is personally reviewed. 05/29/23: EKG Interpretation Date/Time:  Friday May 29 2023 08:37:27 EDT Ventricular Rate:  99 PR Interval:  144 QRS Duration:  74 QT Interval:  368 QTC Calculation: 472 R Axis:   79  Text Interpretation: Normal sinus rhythm with sinus arrhythmia Poor R wave progression Confirmed by Weston Brass ([id number]) on 05/29/2023 9:28:21 AM   02/16/23: Gretta Cool  08/22/22: Sinus Tachycardia, rate 103 bpm 02/07/2022: Sinus rhythm. Poor R wave progression.   Imaging studies that I have independently reviewed today: n/a  Recent Labs: No results found for requested labs within last 365 days.   Recent Lipid Panel    Component Value Date/Time   CHOL 181 02/27/2020 0932   TRIG 159.0 (H) 02/27/2020 0932   HDL 38.60 (L) 02/27/2020 0932   CHOLHDL 5 02/27/2020 0932   VLDL 31.8 02/27/2020 0932   LDLCALC 111 (H) 02/27/2020 0932    Physical Exam:    VS:  BP 102/60   Pulse 99   Ht 5\' 3"  (1.6 m)   Wt 168 lb 12.8 oz (76.6 kg)   SpO2 97%   BMI 29.90 kg/m     Wt Readings from Last 5 Encounters:  05/29/23 168 lb 12.8 oz (76.6 kg)  02/16/23 161 lb 3.2 oz (73.1 kg)  08/22/22 164 lb (74.4 kg)  07/23/22 168 lb 8 oz (76.4 kg)  02/07/22 158 lb 9.6 oz (71.9 kg)    Constitutional: No acute distress Eyes: sclera non-icteric, normal conjunctiva and lids ENMT: normal dentition, moist mucous membranes Cardiovascular: regular rhythm, normal rate, 2/6 mid-to-late peaking systolic ejection murmur, preserved S2.  No jugular venous distention.  Respiratory: clear to auscultation bilaterally GI : normal bowel sounds, soft and nontender. No distention.   MSK: extremities warm, well perfused.  Mild puffy edema. Varicose veins of bilateral LE. NEURO: grossly nonfocal exam, moves all extremities. PSYCH: alert and oriented x  3, normal mood and affect.   ASSESSMENT:    1. SVT (supraventricular tachycardia)   2. Aortic valve stenosis, etiology of cardiac valve disease unspecified   3. Palpitations   4. Dyspnea, unspecified type   5. S/P pericardial window creation   6. Pericardial effusion   7. Thymoma -- managed by [facility] - extensive documentation in Care Everywhere   8. Essential hypertension   9. Anemia, unspecified type    PLAN:    Dyspnea -Continues to be short of breath with exertion, this is likely contributed to by multiple risk factors including ongoing treatment for cancer with chemotherapy, persistent anemia managed by heme-onc, and overall deconditioning.    S/P pericardial window creation  Pericardial effusion -no recurrence of effusion on echo.   Aortic valve stenosis, etiology of cardiac valve disease unspecified  -moderate AS, gradient 28 mmHg.  Remains moderate by exam as well today.  Recheck echo in next follow-up.  Palpitations Anemia - Continued palpitations, improved with treatment of iron deficiency anemia previously, but now recurrent.  Episodes of atrial tachycardia with junctional bradycardia, will request EP review to assist with management moving forward.  Fortunately patient is stable with her symptoms and understanding the rhythm better has given her some reassurance. -Discussed medication therapy for palpitations, she did not feel metoprolol helped significantly.    Thymoma -- managed by [facility] - extensive documentation in Care Everywhere, currently undergoing chemotherapy.  Essential hypertension-blood pressure grossly stable today, continue irbesartan 150 mg daily.   Total time of encounter: 30 minutes total time of encounter, including 20 minutes spent in face-to-face patient care on the date of this encounter. This time includes coordination of care and counseling regarding above mentioned problem list. Remainder of non-face-to-face time involved reviewing chart  documents/testing relevant to the patient encounter and documentation in the medical record. I have independently reviewed documentation from referring provider.   Weston Brass, MD, [facility] [place]  [facility]   Shared Decision Making/Informed Consent:  Medication Adjustments/Labs and Tests Ordered: Current medicines are reviewed at length with the patient today.  Concerns regarding medicines are outlined above.   Orders Placed This Encounter  Procedures   EKG 12-Lead   ECHOCARDIOGRAM COMPLETE    No orders of the defined types were placed in this encounter.   Patient Instructions  Medication Instructions:  Your physician recommends that you continue on your current medications as directed. Please refer to the Current Medication list given to you today.  *If you need a refill on your cardiac medications before your next appointment, please call your pharmacy*  Lab Work: If you have labs (blood work) drawn today and your tests are completely normal, you will receive your results only by: MyChart Message (if you have MyChart) OR A paper copy in the mail If you have any lab test that is abnormal or we need to change your treatment, we will call you to review the results.  Testing/Procedures: Your physician has requested that you have an echocardiogram in 6 months. Echocardiography is a painless test that uses sound waves to create images of your heart. It provides your doctor with information about the size and shape of your heart and how well your heart's chambers and valves are working. This procedure takes approximately one hour. There are no restrictions for this procedure. Please do NOT wear cologne, perfume, aftershave, or lotions (deodorant is allowed). Please arrive 15 minutes prior to your appointment time.  Follow-Up: At [facility], you and your health needs are our priority.  As part of our continuing mission to provide you with exceptional heart  care, we have created designated Provider Care Teams.  These Care Teams include your primary Cardiologist (physician) and Advanced Practice Providers (APPs -  Physician Assistants and Nurse Practitioners) who all work together to provide you with the care you need, when you need it.  We recommend signing up for the patient portal called "MyChart".  Sign up information is provided on this After Visit Summary.  MyChart is used to connect with patients for Virtual Visits (Telemedicine).  Patients are able to view lab/test results, encounter notes, upcoming appointments, etc.  Non-urgent messages can be sent to your provider as well.   To learn more about what you can do with MyChart, go to [email or web address].    Your next appointment:   6 month(s)  Provider:   Parke Poisson, MD

## 2023-06-02 DIAGNOSIS — C37 Malignant neoplasm of thymus: Secondary | ICD-10-CM | POA: Diagnosis not present

## 2023-06-02 DIAGNOSIS — D4989 Neoplasm of unspecified behavior of other specified sites: Secondary | ICD-10-CM | POA: Diagnosis not present

## 2023-06-02 DIAGNOSIS — Z5111 Encounter for antineoplastic chemotherapy: Secondary | ICD-10-CM | POA: Diagnosis not present

## 2023-06-15 ENCOUNTER — Ambulatory Visit: Payer: Medicare Other | Attending: Internal Medicine | Admitting: Internal Medicine

## 2023-06-15 ENCOUNTER — Encounter: Payer: Self-pay | Admitting: Internal Medicine

## 2023-06-15 VITALS — BP 138/82 | HR 100 | Ht 63.0 in | Wt 163.2 lb

## 2023-06-15 DIAGNOSIS — I471 Supraventricular tachycardia, unspecified: Secondary | ICD-10-CM | POA: Insufficient documentation

## 2023-06-15 DIAGNOSIS — Z01812 Encounter for preprocedural laboratory examination: Secondary | ICD-10-CM | POA: Insufficient documentation

## 2023-06-15 NOTE — Patient Instructions (Addendum)
Medication Instructions:  Your physician recommends that you continue on your current medications as directed. Please refer to the Current Medication list given to you today.  *If you need a refill on your cardiac medications before your next appointment, please call your pharmacy*  Lab Work: CBC BMET Sept 25  If you have labs (blood work) drawn today and your tests are completely normal, you will receive your results only by: MyChart Message (if you have MyChart) OR A paper copy in the mail If you have any lab test that is abnormal or we need to change your treatment, we will call you to review the results.  Testing/Procedures: SVT ablation. Instructions on letter.  Follow-Up: At [facility], you and your health needs are our priority.  As part of our continuing mission to provide you with exceptional heart care, we have created designated Provider Care Teams.  These Care Teams include your primary Cardiologist (physician) and Advanced Practice Providers (APPs -  Physician Assistants and Nurse Practitioners) who all work together to provide you with the care you need, when you need it.   Your next appointment:   To be scheduled  Important Information About Sugar

## 2023-06-15 NOTE — Progress Notes (Signed)
HPI Autumn Conley is referred by Dr. Jacques Navy for evaluation of SVT. She is a pleasant 77 yo woman with a h/o dyslipidemia who wore a cardiac monitor and had hours of SVT at rates of over 150/min. Sheh as a h/o metastatic thymoma with a pericardial effusion requiring pericardial window just over 2 years ago. She has a h/o mod/severe AS with a 28 mm mean gradient. She c/o dyspnea and palpitations.  Allergies  Allergen Reactions   Latex Rash and Other (See Comments)    Blisters, also     Current Outpatient Medications  Medication Sig Dispense Refill   acetaminophen (TYLENOL) 500 MG tablet Take by mouth.     ALPRAZolam (XANAX) 0.25 MG tablet Take 0.5 tablets (0.125 mg total) by mouth daily as needed for anxiety. 30 tablet 0   atorvastatin (LIPITOR) 40 MG tablet TAKE 1 TABLET BY MOUTH AT  BEDTIME 90 tablet 3   B-D 3CC LUER-LOK SYR 25GX1" 25G X 1" 3 ML MISC USE TO INJECT  CYANOCOBALAMIN ONCE MONTHLY 3 each 0   butalbital-acetaminophen-caffeine (FIORICET) 50-325-40 MG tablet TAKE 1 TO 2 TABLETS BY MOUTH EVERY 6 HOURS AS NEEDED FOR HEADACHES(S). MANUFACTURER RECOMMENDS NOT EXCEEDING 6 TABLETS PER DAY. 20 tablet 1   Cholecalciferol (VITAMIN D-3 PO) Take 1 capsule by mouth daily.     dexamethasone (DECADRON) 1 MG tablet Take 1 mg by mouth once.     docusate sodium (COLACE) 100 MG capsule Take 100 mg by mouth 2 (two) times daily as needed for mild constipation or moderate constipation.     folic acid (FOLVITE) 1 MG tablet Take 1 mg by mouth daily.     HYDROcodone-acetaminophen (NORCO/VICODIN) 5-325 MG tablet Take 1 tablet by mouth every 6 (six) hours as needed for moderate pain.     irbesartan (AVAPRO) 150 MG tablet TAKE 1 TABLET BY MOUTH DAILY 90 tablet 3   lidocaine-prilocaine (EMLA) cream Apply 1 application. topically as needed.     mupirocin ointment (BACTROBAN) 2 % Apply to nasal area 1-2 times daily 22 g 0   ondansetron (ZOFRAN) 8 MG tablet Take 8 mg by mouth every 8 (eight) hours as needed  for nausea or vomiting.     pantoprazole (PROTONIX) 40 MG tablet Take 1 tablet (40 mg total) by mouth daily before breakfast. 30 tablet 0   PARoxetine (PAXIL) 20 MG tablet TAKE 3 TABLETS BY MOUTH IN THE  MORNING 270 tablet 3   polyethylene glycol powder (GLYCOLAX/MIRALAX) 17 GM/SCOOP powder Take 17 g by mouth daily as needed for mild constipation.     valACYclovir (VALTREX) 500 MG tablet TAKE 1 TABLET BY MOUTH DAILY 90 tablet 3   vitamin E 180 MG (400 UNITS) capsule Take 400 Units by mouth daily.     No current facility-administered medications for this visit.     Past Medical History:  Diagnosis Date   Anemia    Anxiety    Breast cancer (HCC)    Depression    Dyspnea    Fibromyalgia    "some; not chronic" (11/29/2014)   GERD (gastroesophageal reflux disease)    Glaucoma of both eyes    Headache    Hypertension    IBS (irritable bowel syndrome)    Mitral valve prolapse    NASH (nonalcoholic steatohepatitis)    Osteoarthritis    PONV (postoperative nausea and vomiting)    Stroke (HCC)    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(HCC)     ROS:   All systems reviewed  and negative except as noted in the HPI.   Past Surgical History:  Procedure Laterality Date   ABDOMINAL HYSTERECTOMY  1980   APPENDECTOMY  1953   BIOPSY  05/03/2020   Procedure: BIOPSY;  Surgeon: Iva Boop, MD;  Location: [facility] ENDOSCOPY;  Service: Endoscopy;;   BREAST BIOPSY Left    BREAST LUMPECTOMY Left    CATARACT EXTRACTION, BILATERAL  2018   COLONOSCOPY WITH PROPOFOL N/A 05/03/2020   Procedure: COLONOSCOPY WITH PROPOFOL;  Surgeon: Iva Boop, MD;  Location: [facility] ENDOSCOPY;  Service: Endoscopy;  Laterality: N/A;   ESOPHAGOGASTRODUODENOSCOPY (EGD) WITH PROPOFOL N/A 05/03/2020   Procedure: ESOPHAGOGASTRODUODENOSCOPY (EGD) WITH PROPOFOL;  Surgeon: Iva Boop, MD;  Location: [facility] ENDOSCOPY;  Service: Endoscopy;  Laterality: N/A;   INTERCOSTAL NERVE BLOCK Right 04/19/2021   Procedure: INTERCOSTAL NERVE BLOCK;  Surgeon:  Corliss Skains, MD;  Location: [facility] OR;  Service: Thoracic;  Laterality: Right;   MASTECTOMY Left ~ 2009   PARASTERNAL EXPLORATION Right 02/14/2019   Procedure: PARASTERNAL MEDIAL EXPLORATION WITH BIOPIES.;  Surgeon: Delight Ovens, MD;  Location: [facility] OR;  Service: Thoracic;  Laterality: Right;   POLYPECTOMY  05/03/2020   Procedure: POLYPECTOMY;  Surgeon: Iva Boop, MD;  Location: [facility] ENDOSCOPY;  Service: Endoscopy;;     Family History  Problem Relation Age of Onset   Lung cancer Mother 81   Prostate cancer Brother 15   Bladder Cancer Neg Hx    Kidney cancer Neg Hx      Social History   Socioeconomic History   Marital status: Married    Spouse name: Not on file   Number of children: 1   Years of education: 14   Highest education level: Not on file  Occupational History   Occupation: Retired  Tobacco Use   Smoking status: Never   Smokeless tobacco: Never  Vaping Use   Vaping status: Never Used  Substance and Sexual Activity   Alcohol use: No   Drug use: No   Sexual activity: Not Currently  Other Topics Concern   Not on file  Social History Narrative   Lives at home with her husband.   Right-handed.   2 cups caffeine/day.   Social Determinants of Health   Financial Resource Strain: Low Risk  (09/17/2021)   Overall Financial Resource Strain (CARDIA)    Difficulty of Paying Living Expenses: Not hard at all  Food Insecurity: No Food Insecurity (09/17/2021)   Hunger Vital Sign    Worried About Running Out of Food in the Last Year: Never true    Ran Out of Food in the Last Year: Never true  Transportation Needs: No Transportation Needs (09/17/2021)   PRAPARE - Administrator, Civil Service (Medical): No    Lack of Transportation (Non-Medical): No  Physical Activity: Inactive (09/17/2021)   Exercise Vital Sign    Days of Exercise per Week: 0 days    Minutes of Exercise per Session: 0 min  Stress: No Stress Concern Present (09/17/2021)    [employer] of Occupational Health - Occupational Stress Questionnaire    Feeling of Stress : Not at all  Social Connections: Socially Integrated (09/17/2021)   Social Connection and Isolation Panel [NHANES]    Frequency of Communication with Friends and Family: More than three times a week    Frequency of Social Gatherings with Friends and Family: More than three times a week    Attends Religious Services: More than 4 times per year  Active Member of Clubs or Organizations: Yes    Attends Banker Meetings: 1 to 4 times per year    Marital Status: Married  Catering manager Violence: Not At Risk (09/17/2021)   Humiliation, Afraid, Rape, and Kick questionnaire    Fear of Current or Ex-Partner: No    Emotionally Abused: No    Physically Abused: No    Sexually Abused: No     BP 138/82   Pulse 100   Ht 5\' 3"  (1.6 m)   Wt 163 lb 3.2 oz (74 kg)   SpO2 93%   BMI 28.91 kg/m   Physical Exam:  Well appearing NAD HEENT: Unremarkable Neck:  No JVD, no thyromegally Lymphatics:  No adenopathy Back:  No CVA tenderness Lungs:  Clear with no wheezes HEART:  Regular rate rhythm, n2/6 AS murmur, no rubs, no clicks Abd:  soft, positive bowel sounds, no organomegally, no rebound, no guarding Ext:  2 plus pulses, no edema, no cyanosis, no clubbing Skin:  No rashes no nodules Neuro:  CN II through XII intact, motor grossly intact  Assess/Plan: SVT -  I have discussed the treatment options with the patient. The patient has hours of this arrhythmia. The risks/benefits/goals/expectations of EP study and catheter ablation were reviewed. She will call us if she wishes to proceed. 2. AS - I suspect that she will eventually need a TAVR int the not too distant future. A 2D echo is pending.   Autumn Gowda Illyria Sobocinski,MD

## 2023-07-14 DIAGNOSIS — J90 Pleural effusion, not elsewhere classified: Secondary | ICD-10-CM | POA: Diagnosis not present

## 2023-07-14 DIAGNOSIS — M8448XA Pathological fracture, other site, initial encounter for fracture: Secondary | ICD-10-CM | POA: Diagnosis not present

## 2023-07-14 DIAGNOSIS — K2289 Other specified disease of esophagus: Secondary | ICD-10-CM | POA: Diagnosis not present

## 2023-07-14 DIAGNOSIS — D649 Anemia, unspecified: Secondary | ICD-10-CM | POA: Diagnosis not present

## 2023-07-14 DIAGNOSIS — M7989 Other specified soft tissue disorders: Secondary | ICD-10-CM | POA: Diagnosis not present

## 2023-07-14 DIAGNOSIS — J9811 Atelectasis: Secondary | ICD-10-CM | POA: Diagnosis not present

## 2023-07-14 DIAGNOSIS — D508 Other iron deficiency anemias: Secondary | ICD-10-CM | POA: Diagnosis not present

## 2023-07-14 DIAGNOSIS — N281 Cyst of kidney, acquired: Secondary | ICD-10-CM | POA: Diagnosis not present

## 2023-07-14 DIAGNOSIS — Z8616 Personal history of COVID-19: Secondary | ICD-10-CM | POA: Diagnosis not present

## 2023-07-14 DIAGNOSIS — R5383 Other fatigue: Secondary | ICD-10-CM | POA: Diagnosis not present

## 2023-07-14 DIAGNOSIS — M869 Osteomyelitis, unspecified: Secondary | ICD-10-CM | POA: Diagnosis not present

## 2023-07-14 DIAGNOSIS — Z79631 Long term (current) use of antimetabolite agent: Secondary | ICD-10-CM | POA: Diagnosis not present

## 2023-07-14 DIAGNOSIS — R0609 Other forms of dyspnea: Secondary | ICD-10-CM | POA: Diagnosis not present

## 2023-07-14 DIAGNOSIS — I471 Supraventricular tachycardia, unspecified: Secondary | ICD-10-CM | POA: Diagnosis not present

## 2023-07-14 DIAGNOSIS — I350 Nonrheumatic aortic (valve) stenosis: Secondary | ICD-10-CM | POA: Diagnosis not present

## 2023-07-14 DIAGNOSIS — C37 Malignant neoplasm of thymus: Secondary | ICD-10-CM | POA: Diagnosis not present

## 2023-07-14 DIAGNOSIS — D509 Iron deficiency anemia, unspecified: Secondary | ICD-10-CM | POA: Diagnosis not present

## 2023-07-14 DIAGNOSIS — D4989 Neoplasm of unspecified behavior of other specified sites: Secondary | ICD-10-CM | POA: Diagnosis not present

## 2023-07-14 DIAGNOSIS — Z9012 Acquired absence of left breast and nipple: Secondary | ICD-10-CM | POA: Diagnosis not present

## 2023-07-15 ENCOUNTER — Ambulatory Visit: Payer: Medicare Other | Attending: Internal Medicine

## 2023-07-15 DIAGNOSIS — Z01812 Encounter for preprocedural laboratory examination: Secondary | ICD-10-CM

## 2023-07-15 DIAGNOSIS — I471 Supraventricular tachycardia, unspecified: Secondary | ICD-10-CM | POA: Diagnosis not present

## 2023-07-16 LAB — CBC
Hematocrit: 26.1 % — ABNORMAL LOW (ref 34.0–46.6)
Hemoglobin: 8.1 g/dL — ABNORMAL LOW (ref 11.1–15.9)
MCH: 29.8 pg (ref 26.6–33.0)
MCHC: 31 g/dL — ABNORMAL LOW (ref 31.5–35.7)
MCV: 96 fL (ref 79–97)
Platelets: 315 10*3/uL (ref 150–450)
RBC: 2.72 x10E6/uL — CL (ref 3.77–5.28)
RDW: 15.4 % (ref 11.7–15.4)
WBC: 5.8 10*3/uL (ref 3.4–10.8)

## 2023-07-16 LAB — BASIC METABOLIC PANEL
BUN/Creatinine Ratio: 16 (ref 12–28)
BUN: 18 mg/dL (ref 8–27)
CO2: 24 mmol/L (ref 20–29)
Calcium: 9.3 mg/dL (ref 8.7–10.3)
Chloride: 102 mmol/L (ref 96–106)
Creatinine, Ser: 1.16 mg/dL — ABNORMAL HIGH (ref 0.57–1.00)
Glucose: 102 mg/dL — ABNORMAL HIGH (ref 70–99)
Potassium: 4.5 mmol/L (ref 3.5–5.2)
Sodium: 138 mmol/L (ref 134–144)
eGFR: 49 mL/min/{1.73_m2} — ABNORMAL LOW (ref >59)

## 2023-07-28 NOTE — Pre-Procedure Instructions (Signed)
Instructed patient on the following items: Arrival time 0630 Nothing to eat or drink after midnight No meds AM of procedure Responsible person to drive you home and stay with you for 24 hrs

## 2023-07-29 ENCOUNTER — Other Ambulatory Visit: Payer: Self-pay

## 2023-07-29 ENCOUNTER — Encounter (HOSPITAL_COMMUNITY)
Admission: RE | Disposition: A | Discharge status: Home / Self Care | Payer: Self-pay | Source: Home / Self Care | Attending: Internal Medicine

## 2023-07-29 ENCOUNTER — Ambulatory Visit (HOSPITAL_COMMUNITY)
Admission: RE | Admit: 2023-07-29 | Discharge: 2023-07-29 | Disposition: A | Discharge status: Home / Self Care | Payer: Medicare Other | Attending: Internal Medicine | Admitting: Internal Medicine

## 2023-07-29 DIAGNOSIS — I341 Nonrheumatic mitral (valve) prolapse: Secondary | ICD-10-CM | POA: Diagnosis not present

## 2023-07-29 DIAGNOSIS — Z801 Family history of malignant neoplasm of trachea, bronchus and lung: Secondary | ICD-10-CM | POA: Insufficient documentation

## 2023-07-29 DIAGNOSIS — Z9012 Acquired absence of left breast and nipple: Secondary | ICD-10-CM | POA: Diagnosis not present

## 2023-07-29 DIAGNOSIS — I4719 Other supraventricular tachycardia: Secondary | ICD-10-CM | POA: Insufficient documentation

## 2023-07-29 DIAGNOSIS — Z90710 Acquired absence of both cervix and uterus: Secondary | ICD-10-CM | POA: Insufficient documentation

## 2023-07-29 DIAGNOSIS — Z85238 Personal history of other malignant neoplasm of thymus: Secondary | ICD-10-CM | POA: Insufficient documentation

## 2023-07-29 DIAGNOSIS — I471 Supraventricular tachycardia, unspecified: Secondary | ICD-10-CM | POA: Diagnosis not present

## 2023-07-29 DIAGNOSIS — Z853 Personal history of malignant neoplasm of breast: Secondary | ICD-10-CM | POA: Diagnosis not present

## 2023-07-29 DIAGNOSIS — Z8042 Family history of malignant neoplasm of prostate: Secondary | ICD-10-CM | POA: Diagnosis not present

## 2023-07-29 DIAGNOSIS — E785 Hyperlipidemia, unspecified: Secondary | ICD-10-CM | POA: Insufficient documentation

## 2023-07-29 DIAGNOSIS — I10 Essential (primary) hypertension: Secondary | ICD-10-CM | POA: Insufficient documentation

## 2023-07-29 DIAGNOSIS — I5033 Acute on chronic diastolic (congestive) heart failure: Secondary | ICD-10-CM

## 2023-07-29 HISTORY — PX: SVT ABLATION: EP1225

## 2023-07-29 LAB — CBC
HCT: 28.1 % — ABNORMAL LOW (ref 36.0–46.0)
Hemoglobin: 8.5 g/dL — ABNORMAL LOW (ref 12.0–15.0)
MCH: 28.5 pg (ref 26.0–34.0)
MCHC: 30.2 g/dL (ref 30.0–36.0)
MCV: 94.3 fL (ref 80.0–100.0)
Platelets: 283 10*3/uL (ref 150–400)
RBC: 2.98 MIL/uL — ABNORMAL LOW (ref 3.87–5.11)
RDW: 15.5 % (ref 11.5–15.5)
WBC: 4.9 10*3/uL (ref 4.0–10.5)
nRBC: 0 % (ref 0.0–0.2)

## 2023-07-29 SURGERY — SVT ABLATION

## 2023-07-29 MED ORDER — ONDANSETRON HCL 4 MG/2ML IJ SOLN: 4.0000 mg | Freq: Four times a day (QID) | INTRAMUSCULAR | Status: DC | PRN

## 2023-07-29 MED ORDER — ACETAMINOPHEN 325 MG PO TABS
650.0000 mg | ORAL_TABLET | ORAL | Status: DC | PRN
  Administered 2023-07-29: 650 mg via ORAL

## 2023-07-29 MED ORDER — SODIUM CHLORIDE 0.9% FLUSH: 3.0000 mL | INTRAVENOUS | Status: DC | PRN

## 2023-07-29 MED ORDER — HEPARIN (PORCINE) IN NACL 1000-0.9 UT/500ML-% IV SOLN
INTRAVENOUS | Status: DC | PRN
  Administered 2023-07-29: 500 mL

## 2023-07-29 MED ORDER — SODIUM CHLORIDE 0.9 % IV SOLN: INTRAVENOUS | Status: DC

## 2023-07-29 MED ORDER — MIDAZOLAM HCL 5 MG/5ML IJ SOLN
INTRAMUSCULAR | Status: DC | PRN
  Administered 2023-07-29: 1 mg via INTRAVENOUS
  Administered 2023-07-29: 2 mg via INTRAVENOUS
  Administered 2023-07-29: 1 mg via INTRAVENOUS

## 2023-07-29 MED ORDER — SODIUM CHLORIDE 0.9% FLUSH: 10.0000 mL | Freq: Two times a day (BID) | INTRAVENOUS | Status: DC

## 2023-07-29 MED ORDER — ACETAMINOPHEN 325 MG PO TABS
ORAL_TABLET | ORAL | Status: AC
  Filled 2023-07-29: qty 2

## 2023-07-29 MED ORDER — FENTANYL CITRATE (PF) 100 MCG/2ML IJ SOLN
INTRAMUSCULAR | Status: AC
  Filled 2023-07-29: qty 2

## 2023-07-29 MED ORDER — ISOPROTERENOL HCL 0.2 MG/ML IJ SOLN
INTRAMUSCULAR | Status: AC
  Filled 2023-07-29: qty 5

## 2023-07-29 MED ORDER — FENTANYL CITRATE (PF) 100 MCG/2ML IJ SOLN
INTRAMUSCULAR | Status: DC | PRN
  Administered 2023-07-29 (×2): 12.5 ug via INTRAVENOUS
  Administered 2023-07-29: 25 ug via INTRAVENOUS

## 2023-07-29 MED ORDER — MIDAZOLAM HCL 5 MG/5ML IJ SOLN
INTRAMUSCULAR | Status: AC
  Filled 2023-07-29: qty 5

## 2023-07-29 MED ORDER — LIDOCAINE HCL 1 % IJ SOLN
INTRAMUSCULAR | Status: AC
  Filled 2023-07-29: qty 40

## 2023-07-29 MED ORDER — LIDOCAINE HCL (PF) 1 % IJ SOLN
INTRAMUSCULAR | Status: DC | PRN
  Administered 2023-07-29: 30 mL

## 2023-07-29 SURGICAL SUPPLY — 11 items
BAG SNAP BAND KOVER 36X36 (MISCELLANEOUS) IMPLANT
CATH EZ STEER NAV 4MM D-F CUR (ABLATOR) IMPLANT
CATH JOSEPH QUAD ALLRED 6F REP (CATHETERS) IMPLANT
CATH POLARIS X REPROCESSED (CATHETERS) IMPLANT
PACK EP LATEX FREE (CUSTOM PROCEDURE TRAY) ×1
PACK EP LF (CUSTOM PROCEDURE TRAY) ×1 IMPLANT
PAD DEFIB RADIO PHYSIO CONN (PAD) ×1 IMPLANT
PATCH CARTO3 (PAD) IMPLANT
SHEATH PINNACLE 6F 10CM (SHEATH) IMPLANT
SHEATH PINNACLE 7F 10CM (SHEATH) IMPLANT
SHEATH PINNACLE 8F 10CM (SHEATH) IMPLANT

## 2023-07-29 NOTE — Discharge Instructions (Signed)

## 2023-07-29 NOTE — H&P (Signed)
HPI Autumn Conley is referred by Dr. Jacques Navy for evaluation of SVT. She is a pleasant 77 yo woman with a h/o dyslipidemia who wore a cardiac monitor and had hours of SVT at rates of over 150/min. Sheh as a h/o metastatic thymoma with a pericardial effusion requiring pericardial window just over 2 years ago. She has a h/o mod/severe AS with a 28 mm mean gradient. She c/o dyspnea and palpitations.  Allergies       Allergies  Allergen Reactions   Latex Rash and Other (See Comments)      Blisters, also                Current Outpatient Medications  Medication Sig Dispense Refill   acetaminophen (TYLENOL) 500 MG tablet Take by mouth.       ALPRAZolam (XANAX) 0.25 MG tablet Take 0.5 tablets (0.125 mg total) by mouth daily as needed for anxiety. 30 tablet 0   atorvastatin (LIPITOR) 40 MG tablet TAKE 1 TABLET BY MOUTH AT  BEDTIME 90 tablet 3   B-D 3CC LUER-LOK SYR 25GX1" 25G X 1" 3 ML MISC USE TO INJECT  CYANOCOBALAMIN ONCE MONTHLY 3 each 0   butalbital-acetaminophen-caffeine (FIORICET) 50-325-40 MG tablet TAKE 1 TO 2 TABLETS BY MOUTH EVERY 6 HOURS AS NEEDED FOR HEADACHES(S). MANUFACTURER RECOMMENDS NOT EXCEEDING 6 TABLETS PER DAY. 20 tablet 1   Cholecalciferol (VITAMIN D-3 PO) Take 1 capsule by mouth daily.       dexamethasone (DECADRON) 1 MG tablet Take 1 mg by mouth once.       docusate sodium (COLACE) 100 MG capsule Take 100 mg by mouth 2 (two) times daily as needed for mild constipation or moderate constipation.       folic acid (FOLVITE) 1 MG tablet Take 1 mg by mouth daily.       HYDROcodone-acetaminophen (NORCO/VICODIN) 5-325 MG tablet Take 1 tablet by mouth every 6 (six) hours as needed for moderate pain.       irbesartan (AVAPRO) 150 MG tablet TAKE 1 TABLET BY MOUTH DAILY 90 tablet 3   lidocaine-prilocaine (EMLA) cream Apply 1 application. topically as needed.       mupirocin ointment (BACTROBAN) 2 % Apply to nasal area 1-2 times daily 22 g 0   ondansetron (ZOFRAN) 8 MG tablet  Take 8 mg by mouth every 8 (eight) hours as needed for nausea or vomiting.       pantoprazole (PROTONIX) 40 MG tablet Take 1 tablet (40 mg total) by mouth daily before breakfast. 30 tablet 0   PARoxetine (PAXIL) 20 MG tablet TAKE 3 TABLETS BY MOUTH IN THE  MORNING 270 tablet 3   polyethylene glycol powder (GLYCOLAX/MIRALAX) 17 GM/SCOOP powder Take 17 g by mouth daily as needed for mild constipation.       valACYclovir (VALTREX) 500 MG tablet TAKE 1 TABLET BY MOUTH DAILY 90 tablet 3   vitamin E 180 MG (400 UNITS) capsule Take 400 Units by mouth daily.          No current facility-administered medications for this visit.              Past Medical History:  Diagnosis Date   Anemia     Anxiety     Breast cancer (HCC)     Depression     Dyspnea     Fibromyalgia      "some; not chronic" (11/29/2014)   GERD (gastroesophageal reflux disease)  Glaucoma of both eyes     Headache     Hypertension     IBS (irritable bowel syndrome)     Mitral valve prolapse     NASH (nonalcoholic steatohepatitis)     Osteoarthritis     PONV (postoperative nausea and vomiting)     Stroke (HCC)     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(HCC)            ROS:    All systems reviewed and negative except as noted in the HPI.          Past Surgical History:  Procedure Laterality Date   ABDOMINAL HYSTERECTOMY   1980   APPENDECTOMY   1953   BIOPSY   05/03/2020    Procedure: BIOPSY;  Surgeon: Iva Boop, MD;  Location[facility] ENDOSCOPY;  Service: Endoscopy;;   BREAST BIOPSY Left     BREAST LUMPECTOMY Left     CATARACT EXTRACTION, BILATERAL   2018   COLONOSCOPY WITH PROPOFOL N/A 05/03/2020    Procedure: COLONOSCOPY WITH PROPOFOL;  Surgeon: Iva Boop, MD;  Location: [facility] ENDOSCOPY;  Service: Endoscopy;  Laterality: N/A;   ESOPHAGOGASTRODUODENOSCOPY (EGD) WITH PROPOFOL N/A 05/03/2020    Procedure: ESOPHAGOGASTRODUODENOSCOPY (EGD) WITH PROPOFOL;  Surgeon: Iva Boop, MD;  Location: [facility] ENDOSCOPY;  Service: Endoscopy;   Laterality: N/A;   INTERCOSTAL NERVE BLOCK Right 04/19/2021    Procedure: INTERCOSTAL NERVE BLOCK;  Surgeon: Corliss Skains, MD;  Location: [facility] OR;  Service: Thoracic;  Laterality: Right;   MASTECTOMY Left ~ 2009   PARASTERNAL EXPLORATION Right 02/14/2019    Procedure: PARASTERNAL MEDIAL EXPLORATION WITH BIOPIES.;  Surgeon: Delight Ovens, MD;  Location: [facility] OR;  Service: Thoracic;  Laterality: Right;   POLYPECTOMY   05/03/2020    Procedure: POLYPECTOMY;  Surgeon: Iva Boop, MD;  Location[facility] ENDOSCOPY;  Service: Endoscopy;;                 Family History  Problem Relation Age of Onset   Lung cancer Mother 30   Prostate cancer Brother 40   Bladder Cancer Neg Hx     Kidney cancer Neg Hx              Social History         Socioeconomic History   Marital status: Married      Spouse name: Not on file   Number of children: 1   Years of education: 14   Highest education level: Not on file  Occupational History   Occupation: Retired  Tobacco Use   Smoking status: Never   Smokeless tobacco: Never  Vaping Use   Vaping status: Never Used  Substance and Sexual Activity   Alcohol use: No   Drug use: No   Sexual activity: Not Currently  Other Topics Concern   Not on file  Social History Narrative    Lives at home with her husband.    Right-handed.    2 cups caffeine/day.    Social Determinants of Health        Financial Resource Strain: Low Risk  (09/17/2021)    Overall Financial Resource Strain (CARDIA)     Difficulty of Paying Living Expenses: Not hard at all  Food Insecurity: No Food Insecurity (09/17/2021)    Hunger Vital Sign     Worried About Running Out of Food in the Last Year: Never true     Ran Out of Food in the Last Year: Never true  Transportation Needs: No Transportation  Needs (09/17/2021)    PRAPARE - Therapist, art (Medical): No     Lack of Transportation (Non-Medical): No  Physical Activity: Inactive  (09/17/2021)    Exercise Vital Sign     Days of Exercise per Week: 0 days     Minutes of Exercise per Session: 0 min  Stress: No Stress Concern Present (09/17/2021)    [employer] of Occupational Health - Occupational Stress Questionnaire     Feeling of Stress : Not at all  Social Connections: Socially Integrated (09/17/2021)    Social Connection and Isolation Panel [NHANES]     Frequency of Communication with Friends and Family: More than three times a week     Frequency of Social Gatherings with Friends and Family: More than three times a week     Attends Religious Services: More than 4 times per year     Active Member of [employer] or Organizations: Yes     Attends Banker Meetings: 1 to 4 times per year     Marital Status: Married  Catering manager Violence: Not At Risk (09/17/2021)    Humiliation, Afraid, Rape, and Kick questionnaire     Fear of Current or Ex-Partner: No     Emotionally Abused: No     Physically Abused: No     Sexually Abused: No        BP 138/82   Pulse 100   Ht 5\' 3"  (1.6 m)   Wt 163 lb 3.2 oz (74 kg)   SpO2 93%   BMI 28.91 kg/m    Physical Exam:   Well appearing NAD HEENT: Unremarkable Neck:  No JVD, no thyromegally Lymphatics:  No adenopathy Back:  No CVA tenderness Lungs:  Clear with no wheezes HEART:  Regular rate rhythm, n2/6 AS murmur, no rubs, no clicks Abd:  soft, positive bowel sounds, no organomegally, no rebound, no guarding Ext:  2 plus pulses, no edema, no cyanosis, no clubbing Skin:  No rashes no nodules Neuro:  CN II through XII intact, motor grossly intact   Assess/Plan: SVT -  I have discussed the treatment options with the patient. The patient has hours of this arrhythmia. The risks/benefits/goals/expectations of EP study and catheter ablation were reviewed. She will call us if she wishes to proceed. 2. AS - I suspect that she will eventually need a TAVR int the not too distant future. A 2D echo is pending.      Sharlot Gowda Clarinda Obi,MD

## 2023-07-29 NOTE — Progress Notes (Signed)
Patient noticed to have bleeding at right groin site. Pressure held for 20 minutes and dressing changed. Right groin now level 0, clean, dry, and intact. MD notified.

## 2023-07-29 NOTE — Progress Notes (Signed)
Site area: Right femoral venous sheaths 7,8,9 Site Prior to Removal:  Level 0 Pressure Applied: 15 minutes Manual: Yes Patient Status During Pull: Stable Post Sheath Removal Site:  Level 0 Post Sheath Removal Instructions Given:  yes Post Sheath Pulses Present: rt dp palpable Dressing Applied: gauze and tegaderm  Bedrest: Begins @1100  Comments:

## 2023-07-29 NOTE — Progress Notes (Signed)
Patient walked to the bathroom without difficulties. Right groin site level 0, clean, dry, and intact.

## 2023-07-30 ENCOUNTER — Encounter (HOSPITAL_COMMUNITY): Payer: Self-pay | Admitting: Internal Medicine

## 2023-08-04 DIAGNOSIS — C37 Malignant neoplasm of thymus: Secondary | ICD-10-CM | POA: Diagnosis not present

## 2023-08-04 DIAGNOSIS — D4989 Neoplasm of unspecified behavior of other specified sites: Secondary | ICD-10-CM | POA: Diagnosis not present

## 2023-08-11 DIAGNOSIS — Z5111 Encounter for antineoplastic chemotherapy: Secondary | ICD-10-CM | POA: Diagnosis not present

## 2023-08-11 DIAGNOSIS — D509 Iron deficiency anemia, unspecified: Secondary | ICD-10-CM | POA: Diagnosis not present

## 2023-08-11 DIAGNOSIS — D4989 Neoplasm of unspecified behavior of other specified sites: Secondary | ICD-10-CM | POA: Diagnosis not present

## 2023-08-11 DIAGNOSIS — D508 Other iron deficiency anemias: Secondary | ICD-10-CM | POA: Diagnosis not present

## 2023-08-11 DIAGNOSIS — C37 Malignant neoplasm of thymus: Secondary | ICD-10-CM | POA: Diagnosis not present

## 2023-08-11 DIAGNOSIS — Z23 Encounter for immunization: Secondary | ICD-10-CM | POA: Diagnosis not present

## 2023-08-11 DIAGNOSIS — M7989 Other specified soft tissue disorders: Secondary | ICD-10-CM | POA: Diagnosis not present

## 2023-08-11 DIAGNOSIS — M79661 Pain in right lower leg: Secondary | ICD-10-CM | POA: Diagnosis not present

## 2023-08-18 DIAGNOSIS — D509 Iron deficiency anemia, unspecified: Secondary | ICD-10-CM | POA: Diagnosis not present

## 2023-08-18 DIAGNOSIS — C37 Malignant neoplasm of thymus: Secondary | ICD-10-CM | POA: Diagnosis not present

## 2023-08-18 DIAGNOSIS — D4989 Neoplasm of unspecified behavior of other specified sites: Secondary | ICD-10-CM | POA: Diagnosis not present

## 2023-08-18 DIAGNOSIS — Z23 Encounter for immunization: Secondary | ICD-10-CM | POA: Diagnosis not present

## 2023-08-18 DIAGNOSIS — M79661 Pain in right lower leg: Secondary | ICD-10-CM | POA: Diagnosis not present

## 2023-08-18 DIAGNOSIS — D508 Other iron deficiency anemias: Secondary | ICD-10-CM | POA: Diagnosis not present

## 2023-08-18 DIAGNOSIS — M7989 Other specified soft tissue disorders: Secondary | ICD-10-CM | POA: Diagnosis not present

## 2023-08-18 DIAGNOSIS — Z8679 Personal history of other diseases of the circulatory system: Secondary | ICD-10-CM | POA: Diagnosis not present

## 2023-08-18 DIAGNOSIS — Z5111 Encounter for antineoplastic chemotherapy: Secondary | ICD-10-CM | POA: Diagnosis not present

## 2023-08-18 DIAGNOSIS — R0609 Other forms of dyspnea: Secondary | ICD-10-CM | POA: Diagnosis not present

## 2023-09-07 DIAGNOSIS — H524 Presbyopia: Secondary | ICD-10-CM | POA: Diagnosis not present

## 2023-09-07 DIAGNOSIS — Z961 Presence of intraocular lens: Secondary | ICD-10-CM | POA: Diagnosis not present

## 2023-09-07 DIAGNOSIS — H04123 Dry eye syndrome of bilateral lacrimal glands: Secondary | ICD-10-CM | POA: Diagnosis not present

## 2023-09-07 DIAGNOSIS — H40013 Open angle with borderline findings, low risk, bilateral: Secondary | ICD-10-CM | POA: Diagnosis not present

## 2023-09-15 ENCOUNTER — Ambulatory Visit: Payer: Medicare Other | Attending: Internal Medicine | Admitting: Internal Medicine

## 2023-09-15 ENCOUNTER — Encounter: Payer: Self-pay | Admitting: Internal Medicine

## 2023-09-15 VITALS — BP 110/60 | HR 95 | Ht 63.0 in | Wt 161.4 lb

## 2023-09-15 DIAGNOSIS — I471 Supraventricular tachycardia, unspecified: Secondary | ICD-10-CM | POA: Insufficient documentation

## 2023-09-15 NOTE — Progress Notes (Signed)
HPI Autumn Conley returns today for followup. 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 The patient has done well since describing a single episode of heart racing lasting less than a minute. She notes that her breathing is better.  Allergies  Allergen Reactions   Latex Rash and Other (See Comments)    Blisters, also     Current Outpatient Medications  Medication Sig Dispense Refill   ALPRAZolam (XANAX) 0.25 MG tablet Take 0.5 tablets (0.125 mg total) by mouth daily as needed for anxiety. 30 tablet 0   Artificial Tears ophthalmic solution Place 1 drop into both eyes 4 (four) times daily as needed (DRY EYES). EVOH TEARS     atorvastatin (LIPITOR) 40 MG tablet TAKE 1 TABLET BY MOUTH AT  BEDTIME 90 tablet 3   B-D 3CC LUER-LOK SYR 25GX1" 25G X 1" 3 ML MISC USE TO INJECT  CYANOCOBALAMIN ONCE MONTHLY 3 each 0   butalbital-acetaminophen-caffeine (FIORICET) 50-325-40 MG tablet TAKE 1 TO 2 TABLETS BY MOUTH EVERY 6 HOURS AS NEEDED FOR HEADACHES(S). MANUFACTURER RECOMMENDS NOT EXCEEDING 6 TABLETS PER DAY. 20 tablet 1   Cholecalciferol (VITAMIN D-3 PO) Take 1 capsule by mouth daily.     dexamethasone (DECADRON) 1 MG tablet Take 1 mg by mouth daily.     docusate sodium (COLACE) 100 MG capsule Take 100 mg by mouth 2 (two) times daily as needed for mild constipation or moderate constipation.     fluorometholone (FLAREX) 0.1 % ophthalmic suspension Place 1 drop into both eyes 3 (three) times a week.     folic acid (FOLVITE) 1 MG tablet Take 1 mg by mouth daily.     HYDROcodone-acetaminophen (NORCO/VICODIN) 5-325 MG tablet Take 1 tablet by mouth every 6 (six) hours as needed for moderate pain.     irbesartan (AVAPRO) 150 MG tablet TAKE 1 TABLET BY MOUTH DAILY 90 tablet 3   lidocaine-prilocaine (EMLA) cream Apply 1 application. topically as needed.     mupirocin ointment (BACTROBAN) 2 % Apply to  nasal area 1-2 times daily (Patient taking differently: 1 Application See admin instructions. Apply to nasal area 1-2 times daily as needed) 22 g 0   ondansetron (ZOFRAN) 8 MG tablet Take 8 mg by mouth every 8 (eight) hours as needed for nausea or vomiting.     pantoprazole (PROTONIX) 40 MG tablet Take 1 tablet (40 mg total) by mouth daily before breakfast. 30 tablet 0   PARoxetine (PAXIL) 20 MG tablet TAKE 3 TABLETS BY MOUTH IN THE  MORNING 270 tablet 3   polyethylene glycol powder (GLYCOLAX/MIRALAX) 17 GM/SCOOP powder Take 17 g by mouth daily as needed for mild constipation.     valACYclovir (VALTREX) 500 MG tablet TAKE 1 TABLET BY MOUTH DAILY 90 tablet 3   vitamin E 180 MG (400 UNITS) capsule Take 400 Units by mouth daily.     No current facility-administered medications for this visit.     Past Medical History:  Diagnosis Date   Anemia    Anxiety    Breast cancer (HCC)    Depression    Dyspnea    Fibromyalgia    "some; not chronic" (11/29/2014)   GERD (gastroesophageal reflux disease)    Glaucoma of both eyes    Headache    Hypertension    IBS (irritable bowel syndrome)    Mitral valve prolapse  NASH (nonalcoholic steatohepatitis)    Osteoarthritis    PONV (postoperative nausea and vomiting)    Stroke (HCC)    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(HCC)     ROS:   All systems reviewed and negative except as noted in the HPI.   Past Surgical History:  Procedure Laterality Date   ABDOMINAL HYSTERECTOMY  1980   APPENDECTOMY  1953   BIOPSY  05/03/2020   Procedure: BIOPSY;  Surgeon: Iva Boop, MD;  Location: [facility] ENDOSCOPY;  Service: Endoscopy;;   BREAST BIOPSY Left    BREAST LUMPECTOMY Left    CATARACT EXTRACTION, BILATERAL  2018   COLONOSCOPY WITH PROPOFOL N/A 05/03/2020   Procedure: COLONOSCOPY WITH PROPOFOL;  Surgeon: Iva Boop, MD;  Location: [facility] ENDOSCOPY;  Service: Endoscopy;  Laterality: N/A;   ESOPHAGOGASTRODUODENOSCOPY (EGD) WITH PROPOFOL N/A 05/03/2020   Procedure:  ESOPHAGOGASTRODUODENOSCOPY (EGD) WITH PROPOFOL;  Surgeon: Iva Boop, MD;  Location: [facility] ENDOSCOPY;  Service: Endoscopy;  Laterality: N/A;   INTERCOSTAL NERVE BLOCK Right 04/19/2021   Procedure: INTERCOSTAL NERVE BLOCK;  Surgeon: Corliss Skains, MD;  Location: [facility] OR;  Service: Thoracic;  Laterality: Right;   MASTECTOMY Left ~ 2009   PARASTERNAL EXPLORATION Right 02/14/2019   Procedure: PARASTERNAL MEDIAL EXPLORATION WITH BIOPIES.;  Surgeon: Delight Ovens, MD;  Location: [facility] OR;  Service: Thoracic;  Laterality: Right;   POLYPECTOMY  05/03/2020   Procedure: POLYPECTOMY;  Surgeon: Iva Boop, MD;  Location: [facility] ENDOSCOPY;  Service: Endoscopy;;   SVT ABLATION N/A 07/29/2023   Procedure: SVT ABLATION;  Surgeon: Marinus Maw, MD;  Location: [facility] INVASIVE CV LAB;  Service: Cardiovascular;  Laterality: N/A;     Family History  Problem Relation Age of Onset   Lung cancer Mother 12   Prostate cancer Brother 37   Bladder Cancer Neg Hx    Kidney cancer Neg Hx      Social History   Socioeconomic History   Marital status: Married    Spouse name: Not on file   Number of children: 1   Years of education: 14   Highest education level: Not on file  Occupational History   Occupation: Retired  Tobacco Use   Smoking status: Never   Smokeless tobacco: Never  Vaping Use   Vaping status: Never Used  Substance and Sexual Activity   Alcohol use: No   Drug use: No   Sexual activity: Not Currently  Other Topics Concern   Not on file  Social History Narrative   Lives at home with her husband.   Right-handed.   2 cups caffeine/day.   Social Determinants of Health   Financial Resource Strain: Low Risk  (09/17/2021)   Overall Financial Resource Strain (CARDIA)    Difficulty of Paying Living Expenses: Not hard at all  Food Insecurity: No Food Insecurity (09/17/2021)   Hunger Vital Sign    Worried About Running Out of Food in the Last Year: Never true    Ran Out of Food in the  Last Year: Never true  Transportation Needs: No Transportation Needs (09/17/2021)   PRAPARE - Administrator, Civil Service (Medical): No    Lack of Transportation (Non-Medical): No  Physical Activity: Inactive (09/17/2021)   Exercise Vital Sign    Days of Exercise per Week: 0 days    Minutes of Exercise per Session: 0 min  Stress: No Stress Concern Present (09/17/2021)   [employer] of Occupational Health - Occupational Stress Questionnaire    Feeling of Stress :  Not at all  Social Connections: Socially Integrated (09/17/2021)   Social Connection and Isolation Panel [NHANES]    Frequency of Communication with Friends and Family: More than three times a week    Frequency of Social Gatherings with Friends and Family: More than three times a week    Attends Religious Services: More than 4 times per year    Active Member of [employer] or Organizations: Yes    Attends Banker Meetings: 1 to 4 times per year    Marital Status: Married  Catering manager Violence: Not At Risk (09/17/2021)   Humiliation, Afraid, Rape, and Kick questionnaire    Fear of Current or Ex-Partner: No    Emotionally Abused: No    Physically Abused: No    Sexually Abused: No     BP 110/60   Pulse 95   Ht 5\' 3"  (1.6 m)   Wt 161 lb 6.4 oz (73.2 kg)   SpO2 98%   BMI 28.59 kg/m   Physical Exam:  Well appearing NAD HEENT: Unremarkable Neck:  No JVD, no thyromegally Lymphatics:  No adenopathy Back:  No CVA tenderness Lungs:  Clear with no wheezes HEART:  Regular rate rhythm, no murmurs, no rubs, no clicks Abd:  soft, positive bowel sounds, no organomegally, no rebound, no guarding Ext:  2 plus pulses, no edema, no cyanosis, no clubbing Skin:  No rashes no nodules Neuro:  CN II through XII intact, motor grossly intact  EKG - nsr   Assess/Plan: SVT - she is s/p ablation of unusual AVNRT and doing well. She will undergo watchful waiting. Followup as needed.   Sharlot Gowda Chancellor Vanderloop,MD

## 2023-09-15 NOTE — Patient Instructions (Signed)
Medication Instructions:  Your physician recommends that you continue on your current medications as directed. Please refer to the Current Medication list given to you today.  *If you need a refill on your cardiac medications before your next appointment, please call your pharmacy*  Lab Work: None ordered.  If you have labs (blood work) drawn today and your tests are completely normal, you will receive your results only by: MyChart Message (if you have MyChart) OR A paper copy in the mail If you have any lab test that is abnormal or we need to change your treatment, we will call you to review the results.  Testing/Procedures: None ordered.  Follow-Up: At [facility] [place], you and your health needs are our priority.  As part of our continuing mission to provide you with exceptional heart care, we have created designated Provider Care Teams.  These Care Teams include your primary Cardiologist (physician) and Advanced Practice Providers (APPs -  Physician Assistants and Nurse Practitioners) who all work together to provide you with the care you need, when you need it.   Your next appointment:   As needed  The format for your next appointment:   In Person  Provider:   Lewayne Bunting, MD{or one of the following Advanced Practice Providers on your designated Care Team:   Francis Dowse, [place] Casimiro Needle "Mardelle Matte" [place], [place] Earnest Rosier, NP    Important Information About Sugar

## 2023-09-22 DIAGNOSIS — D638 Anemia in other chronic diseases classified elsewhere: Secondary | ICD-10-CM | POA: Diagnosis not present

## 2023-09-22 DIAGNOSIS — C37 Malignant neoplasm of thymus: Secondary | ICD-10-CM | POA: Diagnosis not present

## 2023-09-22 DIAGNOSIS — R933 Abnormal findings on diagnostic imaging of other parts of digestive tract: Secondary | ICD-10-CM | POA: Diagnosis not present

## 2023-09-22 DIAGNOSIS — Z79899 Other long term (current) drug therapy: Secondary | ICD-10-CM | POA: Diagnosis not present

## 2023-09-22 DIAGNOSIS — D4989 Neoplasm of unspecified behavior of other specified sites: Secondary | ICD-10-CM | POA: Diagnosis not present

## 2023-09-22 DIAGNOSIS — J90 Pleural effusion, not elsewhere classified: Secondary | ICD-10-CM | POA: Diagnosis not present

## 2023-09-22 DIAGNOSIS — Z5111 Encounter for antineoplastic chemotherapy: Secondary | ICD-10-CM | POA: Diagnosis not present

## 2023-09-22 DIAGNOSIS — J910 Malignant pleural effusion: Secondary | ICD-10-CM | POA: Diagnosis not present

## 2023-10-05 DIAGNOSIS — Z1159 Encounter for screening for other viral diseases: Secondary | ICD-10-CM | POA: Diagnosis not present

## 2023-10-05 DIAGNOSIS — Z0000 Encounter for general adult medical examination without abnormal findings: Secondary | ICD-10-CM | POA: Diagnosis not present

## 2023-10-05 DIAGNOSIS — G43009 Migraine without aura, not intractable, without status migrainosus: Secondary | ICD-10-CM | POA: Diagnosis not present

## 2023-10-05 DIAGNOSIS — Z131 Encounter for screening for diabetes mellitus: Secondary | ICD-10-CM | POA: Diagnosis not present

## 2023-10-05 DIAGNOSIS — Z13220 Encounter for screening for lipoid disorders: Secondary | ICD-10-CM | POA: Diagnosis not present

## 2023-10-05 DIAGNOSIS — I10 Essential (primary) hypertension: Secondary | ICD-10-CM | POA: Diagnosis not present

## 2023-10-16 ENCOUNTER — Emergency Department (HOSPITAL_COMMUNITY)
Admission: EM | Admit: 2023-10-16 | Discharge: 2023-10-17 | Disposition: A | Discharge status: Home / Self Care | Payer: Medicare Other | Attending: Emergency Medicine | Admitting: Emergency Medicine

## 2023-10-16 ENCOUNTER — Emergency Department (HOSPITAL_COMMUNITY): Payer: Medicare Other

## 2023-10-16 ENCOUNTER — Other Ambulatory Visit: Payer: Self-pay

## 2023-10-16 ENCOUNTER — Telehealth: Payer: Self-pay | Admitting: Internal Medicine

## 2023-10-16 DIAGNOSIS — J929 Pleural plaque without asbestos: Secondary | ICD-10-CM | POA: Diagnosis not present

## 2023-10-16 DIAGNOSIS — R000 Tachycardia, unspecified: Secondary | ICD-10-CM | POA: Diagnosis not present

## 2023-10-16 DIAGNOSIS — R059 Cough, unspecified: Secondary | ICD-10-CM | POA: Insufficient documentation

## 2023-10-16 DIAGNOSIS — Z91040 Latex allergy status: Secondary | ICD-10-CM | POA: Insufficient documentation

## 2023-10-16 DIAGNOSIS — R002 Palpitations: Secondary | ICD-10-CM | POA: Diagnosis not present

## 2023-10-16 DIAGNOSIS — Z853 Personal history of malignant neoplasm of breast: Secondary | ICD-10-CM | POA: Insufficient documentation

## 2023-10-16 DIAGNOSIS — Z85238 Personal history of other malignant neoplasm of thymus: Secondary | ICD-10-CM | POA: Diagnosis not present

## 2023-10-16 DIAGNOSIS — I2510 Atherosclerotic heart disease of native coronary artery without angina pectoris: Secondary | ICD-10-CM | POA: Diagnosis not present

## 2023-10-16 DIAGNOSIS — R062 Wheezing: Secondary | ICD-10-CM | POA: Diagnosis not present

## 2023-10-16 DIAGNOSIS — J90 Pleural effusion, not elsewhere classified: Secondary | ICD-10-CM | POA: Diagnosis not present

## 2023-10-16 DIAGNOSIS — Z20822 Contact with and (suspected) exposure to covid-19: Secondary | ICD-10-CM | POA: Diagnosis not present

## 2023-10-16 DIAGNOSIS — R918 Other nonspecific abnormal finding of lung field: Secondary | ICD-10-CM | POA: Diagnosis not present

## 2023-10-16 DIAGNOSIS — R0602 Shortness of breath: Secondary | ICD-10-CM | POA: Diagnosis not present

## 2023-10-16 LAB — CBC WITH DIFFERENTIAL/PLATELET
Abs Immature Granulocytes: 0 10*3/uL (ref 0.00–0.07)
Basophils Absolute: 0.1 10*3/uL (ref 0.0–0.1)
Basophils Relative: 2 %
Eosinophils Absolute: 0.3 10*3/uL (ref 0.0–0.5)
Eosinophils Relative: 4 %
HCT: 31.3 % — ABNORMAL LOW (ref 36.0–46.0)
Hemoglobin: 10 g/dL — ABNORMAL LOW (ref 12.0–15.0)
Lymphocytes Relative: 6 %
Lymphs Abs: 0.4 10*3/uL — ABNORMAL LOW (ref 0.7–4.0)
MCH: 33.3 pg (ref 26.0–34.0)
MCHC: 31.9 g/dL (ref 30.0–36.0)
MCV: 104.3 fL — ABNORMAL HIGH (ref 80.0–100.0)
Monocytes Absolute: 0.8 10*3/uL (ref 0.1–1.0)
Monocytes Relative: 13 %
Neutro Abs: 4.9 10*3/uL (ref 1.7–7.7)
Neutrophils Relative %: 75 %
Platelets: 316 10*3/uL (ref 150–400)
RBC: 3 MIL/uL — ABNORMAL LOW (ref 3.87–5.11)
RDW: 21.8 % — ABNORMAL HIGH (ref 11.5–15.5)
WBC: 6.5 10*3/uL (ref 4.0–10.5)
nRBC: 0 % (ref 0.0–0.2)
nRBC: 0 /100{WBCs}

## 2023-10-16 LAB — BASIC METABOLIC PANEL
Anion gap: 9 (ref 5–15)
BUN: 22 mg/dL (ref 8–23)
CO2: 25 mmol/L (ref 22–32)
Calcium: 9.2 mg/dL (ref 8.9–10.3)
Chloride: 104 mmol/L (ref 98–111)
Creatinine, Ser: 1.25 mg/dL — ABNORMAL HIGH (ref 0.44–1.00)
GFR, Estimated: 44 mL/min — ABNORMAL LOW (ref >60)
Glucose, Bld: 128 mg/dL — ABNORMAL HIGH (ref 70–99)
Potassium: 3.9 mmol/L (ref 3.5–5.1)
Sodium: 138 mmol/L (ref 135–145)

## 2023-10-16 LAB — TROPONIN I (HIGH SENSITIVITY)
Troponin I (High Sensitivity): 15 ng/L (ref <18)
Troponin I (High Sensitivity): 16 ng/L (ref <18)

## 2023-10-16 NOTE — Telephone Encounter (Signed)
Patient identification verified by 2 forms. Marilynn Rail, RN    Called and spoke to patient  Patient states:   -last night had a heart rate of 160   -she felt her heart beating fast last night  -this occurred last night while sitting in chair   -this was noted on the pulse oximeter   -had ablation with Dr. Ladona Ridgel on 10/09, was advised to outreach if anything occurred    -currently has a cold, is taking Alka seltzer +, NyQuil and DayQuil capsules  -cold symptoms started 1 week ago  -unsure if OTC medication causing symptoms  -After ablation SOB had resolved, noticed SOB returned 2 weeks ago Patient denies:   -heart palpitation  On call RN noted patient sounded very wheezy during call  RN advised patient:   -contact PCP regarding flu like symptoms   -message sent to Dr. Jacques Navy and Dr. Ladona Ridgel for input  -to present to ED if symptoms return  Reviewed ED warning signs/precautions  Patient verbalized understanding, no questions at this time

## 2023-10-16 NOTE — Telephone Encounter (Signed)
Per chart review patient presented to ED, currently in ED  Nursing will outreach Monday for follow up

## 2023-10-16 NOTE — ED Triage Notes (Signed)
Pt referred to ED from [facility] for tachycardia and SOB. Pt reports 3 day hx of URI symptoms. Pt denies CP. Pt recently had ablation in October for SVT.

## 2023-10-16 NOTE — ED Notes (Signed)
Deferred resp panel d/t pt having neg covid/flu test at [facility] earlier today.

## 2023-10-16 NOTE — Telephone Encounter (Signed)
Parke Poisson, MD  You; Marinus Maw, MD; Morrell Riddle, RN18 minutes ago (4:29 PM)    Symptoms likely exacerbated by cold medicine. If isolated episode and she's well, let's just monitor. If it happens again or she's worried have her call for a dod appt next week.

## 2023-10-16 NOTE — ED Provider Triage Note (Signed)
Emergency Medicine Provider Triage Evaluation Note  Autumn Conley , a 77 y.o. female  was evaluated in triage.  Pt complains of tachycardia.  Review of Systems  Positive:  Negative:   Physical Exam  BP (!) 172/91 (BP Location: Right Arm)   Pulse (!) 115   Temp 97.8 F (36.6 C)   Resp (!) 24   Ht 5\' 3"  (1.6 m)   Wt 75.3 kg   SpO2 96%   BMI 29.41 kg/m  Gen:   Awake, no distress   Resp:  Normal effort  MSK:   Moves extremities without difficulty  Other:    Medical Decision Making  Medically screening exam initiated at 5:12 PM.  Appropriate orders placed.  Autumn Conley was informed that the remainder of the evaluation will be completed by another provider, this initial triage assessment does not replace that evaluation, and the importance of remaining in the ED until their evaluation is complete.  SOB for weeks. Tachycardia since last night. Patient with hx of SVT and ablation in October. Productive cough, congestion, sneezing, rhinorrhea x3 days.    Autumn Conley, [place] 10/16/23 1715

## 2023-10-16 NOTE — Telephone Encounter (Signed)
STAT if HR is under 50 or over 120 (normal HR is 60-100 beats per minute)  What is your heart rate? 160 last night   Do you have a log of your heart rate readings (document readings)? No  Do you have any other symptoms? No

## 2023-10-17 ENCOUNTER — Emergency Department (HOSPITAL_COMMUNITY): Payer: Medicare Other

## 2023-10-17 DIAGNOSIS — I2510 Atherosclerotic heart disease of native coronary artery without angina pectoris: Secondary | ICD-10-CM | POA: Diagnosis not present

## 2023-10-17 DIAGNOSIS — R0602 Shortness of breath: Secondary | ICD-10-CM | POA: Diagnosis not present

## 2023-10-17 DIAGNOSIS — R000 Tachycardia, unspecified: Secondary | ICD-10-CM | POA: Diagnosis not present

## 2023-10-17 DIAGNOSIS — J929 Pleural plaque without asbestos: Secondary | ICD-10-CM | POA: Diagnosis not present

## 2023-10-17 DIAGNOSIS — J90 Pleural effusion, not elsewhere classified: Secondary | ICD-10-CM | POA: Diagnosis not present

## 2023-10-17 MED ORDER — IOHEXOL 350 MG/ML SOLN
60.0000 mL | Freq: Once | INTRAVENOUS | Status: AC | PRN
  Administered 2023-10-17: 60 mL via INTRAVENOUS

## 2023-10-17 MED ORDER — ALBUTEROL SULFATE HFA 108 (90 BASE) MCG/ACT IN AERS
1.0000 | INHALATION_SPRAY | Freq: Four times a day (QID) | RESPIRATORY_TRACT | 0 refills | Status: AC | PRN
Start: 2023-10-17 — End: ?

## 2023-10-17 NOTE — Discharge Instructions (Addendum)
Your workup today showed CT findings consistent with your recent PET scan.  Please discuss these with your oncologist.  There was no pulmonary embolism or other acute finding on your imaging.  I have prescribed an albuterol inhaler to be used as directed.  Follow-up with primary care for further evaluation of wheezing and shortness of breath as needed.  If you develop life-threatening symptoms return to the emergency department.

## 2023-10-17 NOTE — ED Provider Notes (Signed)
[place] EMERGENCY DEPARTMENT AT [facility] Provider Note   CSN: [id number] Arrival date & time: 10/16/23  1647     History  Chief Complaint  Patient presents with   Shortness of Breath    Autumn Conley is a 77 y.o. female.  Patient with past medical history significant for thymus cancer, anxiety, GERD, stroke, breast cancer, SVT, previous ablation presents to the emergency department complaining of shortness of breath and tachycardia.  Patient endorses approximately 2 weeks of shortness of breath, she states that there was 1 episode approximately 1 and half to 2 weeks ago where she was watching TV and her heart rate went to the 160s and stayed there for approximately 30 minutes.  After 30 minutes the heart rate returned to the 60s.  No intervention was attempted.  Over the past few days she has had a worsening cough coinciding with shortness of breath and was evaluated earlier today at urgent care.  Urgent care administered a DuoNeb and a steroid.  Urgent care reported no improvement in symptoms.  Upon my assessment the patient is breathing clearly with no further wheezing and states she is feeling better as far as the wheezing and cough is concerned.  Urgent care was concerned about possible pulmonary embolism with patient's tachycardia and shortness of breath and cancer history.  Patient is not anticoagulated.  She denies chest pain, abdominal pain, nausea, vomiting, fevers.   Shortness of Breath      Home Medications Prior to Admission medications   Medication Sig Start Date End Date Taking? Authorizing Provider  albuterol (VENTOLIN HFA) 108 (90 Base) MCG/ACT inhaler Inhale 1-2 puffs into the lungs every 6 (six) hours as needed for wheezing or shortness of breath. 10/17/23  Yes Darrick Grinder, PA-C  ALPRAZolam Prudy Feeler) 0.25 MG tablet Take 0.5 tablets (0.125 mg total) by mouth daily as needed for anxiety. 02/27/20   Jarold Motto, PA  Artificial Tears ophthalmic  solution Place 1 drop into both eyes 4 (four) times daily as needed (DRY EYES). EVOH TEARS    [provider]  atorvastatin (LIPITOR) 40 MG tablet TAKE 1 TABLET BY MOUTH AT  BEDTIME 05/22/23   Worley, Firth, PA  B-D 3CC LUER-LOK SYR 25GX1" 25G X 1" 3 ML MISC USE TO INJECT  CYANOCOBALAMIN ONCE MONTHLY 05/27/21   Jarold Motto, PA  butalbital-acetaminophen-caffeine (FIORICET) 50-325-40 MG tablet TAKE 1 TO 2 TABLETS BY MOUTH EVERY 6 HOURS AS NEEDED FOR HEADACHES(S). MANUFACTURER RECOMMENDS NOT EXCEEDING 6 TABLETS PER DAY. 02/18/22   Jarold Motto, PA  Cholecalciferol (VITAMIN D-3 PO) Take 1 capsule by mouth daily.    [provider]  dexamethasone (DECADRON) 1 MG tablet Take 1 mg by mouth daily. 01/02/22   [provider]  docusate sodium (COLACE) 100 MG capsule Take 100 mg by mouth 2 (two) times daily as needed for mild constipation or moderate constipation.    [provider]  fluorometholone (FLAREX) 0.1 % ophthalmic suspension Place 1 drop into both eyes 3 (three) times a week.    [provider]  folic acid (FOLVITE) 1 MG tablet Take 1 mg by mouth daily. 07/23/21   [provider]  HYDROcodone-acetaminophen (NORCO/VICODIN) 5-325 MG tablet Take 1 tablet by mouth every 6 (six) hours as needed for moderate pain.    [provider]  irbesartan (AVAPRO) 150 MG tablet TAKE 1 TABLET BY MOUTH DAILY 12/22/22   Jarold Motto, PA  lidocaine-prilocaine (EMLA) cream Apply 1 application. topically as needed.  [provider]  mupirocin ointment (BACTROBAN) 2 % Apply to nasal area 1-2 times daily Patient taking differently: 1 Application See admin instructions. Apply to nasal area 1-2 times daily as needed 01/27/22   Jarold Motto, PA  ondansetron (ZOFRAN) 8 MG tablet Take 8 mg by mouth every 8 (eight) hours as needed for nausea or vomiting.    [provider]  pantoprazole (PROTONIX) 40 MG tablet Take 1 tablet (40 mg total) by  mouth daily before breakfast. 05/04/20   Marguerita Merles Latif, DO  PARoxetine (PAXIL) 20 MG tablet TAKE 3 TABLETS BY MOUTH IN THE  MORNING 04/09/23   Jarold Motto, PA  polyethylene glycol powder (GLYCOLAX/MIRALAX) 17 GM/SCOOP powder Take 17 g by mouth daily as needed for mild constipation.    [provider]  valACYclovir (VALTREX) 500 MG tablet TAKE 1 TABLET BY MOUTH DAILY 04/09/23   Jarold Motto, PA  vitamin E 180 MG (400 UNITS) capsule Take 400 Units by mouth daily.    [provider]      Allergies    Latex    Review of Systems   Review of Systems  Respiratory:  Positive for shortness of breath.     Physical Exam Updated Vital Signs BP (!) 149/89   Pulse 99   Temp 97.9 F (36.6 C) (Oral)   Resp 10   Ht 5\' 3"  (1.6 m)   Wt 75.3 kg   SpO2 97%   BMI 29.41 kg/m  Physical Exam Vitals and nursing note reviewed.  Constitutional:      General: She is not in acute distress.    Appearance: She is well-developed.  HENT:     Head: Normocephalic and atraumatic.  Eyes:     Conjunctiva/sclera: Conjunctivae normal.  Cardiovascular:     Rate and Rhythm: Regular rhythm. Tachycardia present.  Pulmonary:     Effort: Pulmonary effort is normal. No respiratory distress.     Breath sounds: Normal breath sounds. No wheezing.  Abdominal:     Palpations: Abdomen is soft.     Tenderness: There is no abdominal tenderness.  Musculoskeletal:        General: No swelling.     Cervical back: Neck supple.  Skin:    General: Skin is warm and dry.     Capillary Refill: Capillary refill takes less than 2 seconds.  Neurological:     Mental Status: She is alert.  Psychiatric:        Mood and Affect: Mood normal.     ED Results / Procedures / Treatments   Labs (all labs ordered are listed, but only abnormal results are displayed) Labs Reviewed  CBC WITH DIFFERENTIAL/PLATELET - Abnormal; Notable for the following components:      Result Value   RBC 3.00 (*)     Hemoglobin 10.0 (*)    HCT 31.3 (*)    MCV 104.3 (*)    RDW 21.8 (*)    Lymphs Abs 0.4 (*)    All other components within normal limits  BASIC METABOLIC PANEL - Abnormal; Notable for the following components:   Glucose, Bld 128 (*)    Creatinine, Ser 1.25 (*)    GFR, Estimated 44 (*)    All other components within normal limits  TROPONIN I (HIGH SENSITIVITY)  TROPONIN I (HIGH SENSITIVITY)    EKG EKG Interpretation Date/Time:  Saturday October 17 2023 04:11:17 EST Ventricular Rate:  99 PR Interval:  154 QRS Duration:  93 QT Interval:  402 QTC Calculation:  516 R Axis:   54  Text Interpretation: Sinus rhythm Probable left atrial enlargement Prolonged QT interval Confirmed by Kennis Carina [phone]) on 10/17/2023 6:40:35 AM  Radiology CT Angio Chest PE W and/or Wo Contrast Result Date: 10/17/2023 CLINICAL DATA:  77 year old female with history of tachycardia. Evaluate for pulmonary embolism. History of breast cancer in thymoma. * Tracking Code: BO * EXAM: CT ANGIOGRAPHY CHEST WITH CONTRAST TECHNIQUE: Multidetector CT imaging of the chest was performed using the standard protocol during bolus administration of intravenous contrast. Multiplanar CT image reconstructions and MIPs were obtained to evaluate the vascular anatomy. RADIATION DOSE REDUCTION: This exam was performed according to the departmental dose-optimization program which includes automated exposure control, adjustment of the mA and/or kV according to patient size and/or use of iterative reconstruction technique. CONTRAST:  60mL OMNIPAQUE IOHEXOL 350 MG/ML SOLN COMPARISON:  Chest CT 04/18/2021. FINDINGS: Cardiovascular: No filling defects are noted within the pulmonary arterial tree to suggest pulmonary embolism. Heart size is borderline enlarged. There is no significant pericardial fluid, thickening or pericardial calcification. There is aortic atherosclerosis, as well as atherosclerosis of the great vessels of the mediastinum  and the coronary arteries, including calcified atherosclerotic plaque in the left anterior descending and right coronary arteries. Calcifications of the aortic valve and mitral annulus. Right internal jugular double-lumen porta cath with tip terminating in the distal superior vena cava. Mediastinum/Nodes: Poorly defined soft tissue in the subcarinal region likely represents a cluster of borderline enlarged or mildly enlarged lymph nodes, overall measuring approximately 2.0 x 2.7 cm (axial image 72 of series 5). Esophagus is unremarkable in appearance. No axillary lymphadenopathy. Lungs/Pleura: Extensive pleural thickening and small bilateral partially loculated pleural effusions, decreased in size compared to the prior examination. Extensive soft tissue fullness in the anterior aspect of the right hemithorax adjacent to the right heart border, contiguous with possible chest wall mass in the right anterior chest wall (discussed below), and adjacent to area of probable chronic postradiation mass-like fibrosis (demonstrated on prior chest CTs). Chronic architectural distortion in the inferior segment of the lingula, similar to the prior study. New mass-like opacities in the base of the left lower lobe have an appearance suggestive of developing rounded atelectasis. No definite consolidative airspace disease. Upper Abdomen: Aortic atherosclerosis. Small diverticulum in the region of the gastric cardia incidentally noted. Musculoskeletal: In the deep right breast (axial image 70 of series 5) there is some nodular enhancing soft tissue with some internal calcifications, which is increasingly prominent when compared to prior examinations estimated to measure approximately 4.7 x 2.1 cm, concerning for invasive breast mass. This is adjacent to an additional area of soft tissue fullness in the chest wall which involves several ribs (axial image 77 of series 5) estimated to measure at least 7.0 x 3.0 cm, associated with gross  bony destruction of the anterior aspect of the right third and fourth ribs. Old fractures of fourth, fifth and sixth ribs laterally are again noted. Review of the MIP images confirms the above findings. IMPRESSION: 1. No evidence of pulmonary embolism. 2. Findings, as above, concerning for new invasive chest wall masses on the right. Findings are highly concerning for neoplasm. Whether this represents new right-sided breast cancer, or recurrent chest wall invasive thymoma is uncertain. Further clinical evaluation is recommended, and follow-up PET-CT should be considered if clinically appropriate. 3. Small chronic partially loculated pleural effusions bilaterally, decreased in size compared to the prior study. Extensive pleural thickening bilaterally, with probable developing areas of rounded atelectasis in the  base of the left lower lobe (attention at time of forthcoming PET-CT is recommended to ensure lack of hypermetabolism). 4. Soft tissue fullness in the subcarinal nodal station concerning for potential malignant lymphadenopathy. 5. Aortic atherosclerosis, in addition to two-vessel coronary artery disease. Assessment for potential risk factor modification, dietary therapy or pharmacologic therapy may be warranted, if clinically indicated. 6. There are calcifications of the aortic valve and mitral annulus. Echocardiographic correlation for evaluation of potential valvular dysfunction may be warranted if clinically indicated. Aortic Atherosclerosis (ICD10-I70.0). Electronically Signed   By: Trudie Reed M.D.   On: 10/17/2023 06:28   DG Chest 2 View Result Date: 10/16/2023 CLINICAL DATA:  Cough, tachycardia EXAM: CHEST - 2 VIEW COMPARISON:  06/07/2021 FINDINGS: Frontal and lateral views of the chest demonstrate right chest wall port via internal jugular approach, tip in the region of the superior vena cava. The cardiac silhouette is enlarged. Postsurgical changes of the right chest wall, with underlying  parenchymal lung scarring again noted. There is a small right pleural effusion versus pleural thickening. Small left pleural effusion and left basilar consolidation, new since prior exam. No evidence of pneumothorax. No acute fracture. IMPRESSION: 1. Postsurgical or post therapeutic changes within the right chest, stable. Small right pleural effusion versus pleural thickening. 2. Small left pleural effusion and left basilar consolidation, new since prior study. Underlying pneumonia cannot be excluded. Electronically Signed   By: Sharlet Salina M.D.   On: 10/16/2023 18:04    Procedures Procedures    Medications Ordered in ED Medications  iohexol (OMNIPAQUE) 350 MG/ML injection 60 mL (60 mLs Intravenous Contrast Given 10/17/23 7169)    ED Course/ Medical Decision Making/ A&P                                  Medical Decision Making Amount and/or Complexity of Data Reviewed Radiology: ordered.  Risk Prescription drug management.   This patient presents to the ED for concern of shortness of breath and tachycardia, this involves an extensive number of treatment options, and is a complaint that carries with it a high risk of complications and morbidity.  The differential diagnosis includes pulmonary embolism, viral illness, bronchitis, dysrhythmia, others   Co morbidities that complicate the patient evaluation  History of SVT   Additional history obtained:  Additional history obtained from family External records from outside source obtained and reviewed including urgent care notes   Lab Tests:  I Ordered, and personally interpreted labs.  The pertinent results include: Grossly unremarkable BMP, CBC, troponin   Imaging Studies ordered:  I ordered imaging studies including chest x-ray and CT angio chest PE study I independently visualized and interpreted imaging which showed  1. Postsurgical or post therapeutic changes within the right chest,  stable. Small right pleural  effusion versus pleural thickening.  2. Small left pleural effusion and left basilar consolidation, new  since prior study. Underlying pneumonia cannot be excluded.   1. No evidence of pulmonary embolism.  2. Findings, as above, concerning for new invasive chest wall masses  on the right. Findings are highly concerning for neoplasm. Whether  this represents new right-sided breast cancer, or recurrent chest  wall invasive thymoma is uncertain. Further clinical evaluation is  recommended, and follow-up PET-CT should be considered if clinically  appropriate.  3. Small chronic partially loculated pleural effusions bilaterally,  decreased in size compared to the prior study. Extensive pleural  thickening bilaterally, with probable  developing areas of rounded  atelectasis in the base of the left lower lobe (attention at time of  forthcoming PET-CT is recommended to ensure lack of  hypermetabolism).  4. Soft tissue fullness in the subcarinal nodal station concerning  for potential malignant lymphadenopathy.  5. Aortic atherosclerosis, in addition to two-vessel coronary artery  disease. Assessment for potential risk factor modification, dietary  therapy or pharmacologic therapy may be warranted, if clinically  indicated.  6. There are calcifications of the aortic valve and mitral annulus.  Echocardiographic correlation for evaluation of potential valvular  dysfunction may be warranted if clinically indicated.    Aortic Atherosclerosis   I agree with the radiologist interpretation   Cardiac Monitoring: / EKG:  The patient was maintained on a cardiac monitor.  I personally viewed and interpreted the cardiac monitored which showed an underlying rhythm of: Sinus rhythm   Test / Admission - Considered:  Findings were discussed with patient who had a recent PET scan was aware of possible worsening malignancy.  Patient with no pulmonary embolism.  No indication for further emergent workup or  admission.  Will discharge home with albuterol inhaler and recommendations for follow-up with primary care and oncology.         Final Clinical Impression(s) / ED Diagnoses Final diagnoses:  Shortness of breath    Rx / DC Orders ED Discharge Orders          Ordered    albuterol (VENTOLIN HFA) 108 (90 Base) MCG/ACT inhaler  Every 6 hours PRN        10/17/23 0645              Darrick Grinder, PA-C 10/17/23 8295    Sabas Sous, MD 10/17/23 [phone]

## 2023-10-19 NOTE — Telephone Encounter (Signed)
Patient identification verified by 2 forms. Marilynn Rail, RN    Called and spoke to patient  Patient states:   -presented to ED on 12/27 due to SOB related to cold   -has not had any episodes of tachycardia since 12/26 Relayed provider message below  Informed patient:   -continue to monitor for tachycardia    -follow up with PCP regarding cold/ILI symptoms  Reviewed ED warning signs/precautions  Patient verbalized understanding, no questions at this time

## 2023-11-07 ENCOUNTER — Other Ambulatory Visit: Payer: Self-pay | Admitting: Physician Assistant

## 2023-11-07 DIAGNOSIS — I10 Essential (primary) hypertension: Secondary | ICD-10-CM

## 2023-12-01 ENCOUNTER — Ambulatory Visit (HOSPITAL_COMMUNITY): Payer: Medicare Other | Attending: Internal Medicine

## 2023-12-01 ENCOUNTER — Encounter (HOSPITAL_COMMUNITY): Payer: Self-pay | Admitting: Internal Medicine

## 2023-12-17 ENCOUNTER — Ambulatory Visit (HOSPITAL_COMMUNITY): Payer: Medicare Other | Attending: Internal Medicine

## 2023-12-17 DIAGNOSIS — I350 Nonrheumatic aortic (valve) stenosis: Secondary | ICD-10-CM | POA: Diagnosis present

## 2023-12-17 LAB — ECHOCARDIOGRAM COMPLETE
AR max vel: 1.46 cm2
AV Area VTI: 1.62 cm2
AV Area mean vel: 1.41 cm2
AV Mean grad: 27.6 mm[Hg]
AV Peak grad: 47.2 mm[Hg]
Ao pk vel: 3.44 m/s
Area-P 1/2: 3.57 cm2
P 1/2 time: 321 ms
S' Lateral: 2.5 cm

## 2023-12-21 ENCOUNTER — Encounter: Payer: Self-pay | Admitting: *Deleted

## 2023-12-21 ENCOUNTER — Other Ambulatory Visit (HOSPITAL_COMMUNITY): Payer: Self-pay | Admitting: *Deleted

## 2023-12-21 DIAGNOSIS — I350 Nonrheumatic aortic (valve) stenosis: Secondary | ICD-10-CM

## 2023-12-26 ENCOUNTER — Encounter (HOSPITAL_COMMUNITY): Payer: Self-pay

## 2023-12-26 ENCOUNTER — Other Ambulatory Visit: Payer: Self-pay

## 2023-12-26 ENCOUNTER — Emergency Department (HOSPITAL_COMMUNITY)

## 2023-12-26 ENCOUNTER — Inpatient Hospital Stay (HOSPITAL_COMMUNITY)
Admission: EM | Admit: 2023-12-26 | Discharge: 2023-12-28 | DRG: 602 | Disposition: A | Discharge status: Home / Self Care | Attending: Internal Medicine | Admitting: Internal Medicine

## 2023-12-26 DIAGNOSIS — D4989 Neoplasm of unspecified behavior of other specified sites: Secondary | ICD-10-CM | POA: Diagnosis present

## 2023-12-26 DIAGNOSIS — L0390 Cellulitis, unspecified: Secondary | ICD-10-CM | POA: Diagnosis present

## 2023-12-26 DIAGNOSIS — Z9842 Cataract extraction status, left eye: Secondary | ICD-10-CM

## 2023-12-26 DIAGNOSIS — N179 Acute kidney failure, unspecified: Secondary | ICD-10-CM | POA: Diagnosis present

## 2023-12-26 DIAGNOSIS — Z85238 Personal history of other malignant neoplasm of thymus: Secondary | ICD-10-CM | POA: Diagnosis not present

## 2023-12-26 DIAGNOSIS — F419 Anxiety disorder, unspecified: Secondary | ICD-10-CM | POA: Diagnosis present

## 2023-12-26 DIAGNOSIS — L03116 Cellulitis of left lower limb: Secondary | ICD-10-CM | POA: Diagnosis not present

## 2023-12-26 DIAGNOSIS — Z9012 Acquired absence of left breast and nipple: Secondary | ICD-10-CM | POA: Diagnosis not present

## 2023-12-26 DIAGNOSIS — M7989 Other specified soft tissue disorders: Secondary | ICD-10-CM

## 2023-12-26 DIAGNOSIS — K219 Gastro-esophageal reflux disease without esophagitis: Secondary | ICD-10-CM | POA: Diagnosis present

## 2023-12-26 DIAGNOSIS — F32A Depression, unspecified: Secondary | ICD-10-CM | POA: Diagnosis present

## 2023-12-26 DIAGNOSIS — Z90710 Acquired absence of both cervix and uterus: Secondary | ICD-10-CM

## 2023-12-26 DIAGNOSIS — D61810 Antineoplastic chemotherapy induced pancytopenia: Secondary | ICD-10-CM | POA: Diagnosis present

## 2023-12-26 DIAGNOSIS — Z853 Personal history of malignant neoplasm of breast: Secondary | ICD-10-CM

## 2023-12-26 DIAGNOSIS — I341 Nonrheumatic mitral (valve) prolapse: Secondary | ICD-10-CM | POA: Diagnosis present

## 2023-12-26 DIAGNOSIS — Z801 Family history of malignant neoplasm of trachea, bronchus and lung: Secondary | ICD-10-CM

## 2023-12-26 DIAGNOSIS — D849 Immunodeficiency, unspecified: Secondary | ICD-10-CM | POA: Diagnosis present

## 2023-12-26 DIAGNOSIS — I509 Heart failure, unspecified: Secondary | ICD-10-CM

## 2023-12-26 DIAGNOSIS — I110 Hypertensive heart disease with heart failure: Secondary | ICD-10-CM | POA: Diagnosis present

## 2023-12-26 DIAGNOSIS — I5032 Chronic diastolic (congestive) heart failure: Secondary | ICD-10-CM | POA: Diagnosis present

## 2023-12-26 DIAGNOSIS — Z8673 Personal history of transient ischemic attack (TIA), and cerebral infarction without residual deficits: Secondary | ICD-10-CM

## 2023-12-26 DIAGNOSIS — Z79899 Other long term (current) drug therapy: Secondary | ICD-10-CM | POA: Diagnosis not present

## 2023-12-26 DIAGNOSIS — D61818 Other pancytopenia: Principal | ICD-10-CM

## 2023-12-26 DIAGNOSIS — Z803 Family history of malignant neoplasm of breast: Secondary | ICD-10-CM | POA: Diagnosis not present

## 2023-12-26 DIAGNOSIS — M797 Fibromyalgia: Secondary | ICD-10-CM | POA: Diagnosis present

## 2023-12-26 DIAGNOSIS — I10 Essential (primary) hypertension: Secondary | ICD-10-CM | POA: Diagnosis present

## 2023-12-26 DIAGNOSIS — Z9221 Personal history of antineoplastic chemotherapy: Secondary | ICD-10-CM

## 2023-12-26 DIAGNOSIS — Z789 Other specified health status: Secondary | ICD-10-CM

## 2023-12-26 DIAGNOSIS — Z9841 Cataract extraction status, right eye: Secondary | ICD-10-CM | POA: Diagnosis not present

## 2023-12-26 LAB — CBC WITH DIFFERENTIAL/PLATELET
Abs Immature Granulocytes: 0.01 10*3/uL (ref 0.00–0.07)
Basophils Absolute: 0 10*3/uL (ref 0.0–0.1)
Basophils Relative: 1 %
Eosinophils Absolute: 0 10*3/uL (ref 0.0–0.5)
Eosinophils Relative: 2 %
HCT: 25.6 % — ABNORMAL LOW (ref 36.0–46.0)
Hemoglobin: 8.6 g/dL — ABNORMAL LOW (ref 12.0–15.0)
Immature Granulocytes: 1 %
Lymphocytes Relative: 28 %
Lymphs Abs: 0.6 10*3/uL — ABNORMAL LOW (ref 0.7–4.0)
MCH: 35.7 pg — ABNORMAL HIGH (ref 26.0–34.0)
MCHC: 33.6 g/dL (ref 30.0–36.0)
MCV: 106.2 fL — ABNORMAL HIGH (ref 80.0–100.0)
Monocytes Absolute: 0.5 10*3/uL (ref 0.1–1.0)
Monocytes Relative: 27 %
Neutro Abs: 0.8 10*3/uL — ABNORMAL LOW (ref 1.7–7.7)
Neutrophils Relative %: 41 %
Platelets: 86 10*3/uL — ABNORMAL LOW (ref 150–400)
RBC: 2.41 MIL/uL — ABNORMAL LOW (ref 3.87–5.11)
RDW: 13.1 % (ref 11.5–15.5)
Smear Review: DECREASED
WBC: 2 10*3/uL — ABNORMAL LOW (ref 4.0–10.5)
nRBC: 0 % (ref 0.0–0.2)

## 2023-12-26 LAB — IRON AND TIBC
Iron: 45 ug/dL (ref 28–170)
Saturation Ratios: 19 % (ref 10.4–31.8)
TIBC: 244 ug/dL — ABNORMAL LOW (ref 250–450)
UIBC: 199 ug/dL

## 2023-12-26 LAB — FERRITIN: Ferritin: 947 ng/mL — ABNORMAL HIGH (ref 11–307)

## 2023-12-26 LAB — I-STAT CG4 LACTIC ACID, ED: Lactic Acid, Venous: 0.9 mmol/L (ref 0.5–1.9)

## 2023-12-26 LAB — BASIC METABOLIC PANEL
Anion gap: 10 (ref 5–15)
BUN: 27 mg/dL — ABNORMAL HIGH (ref 8–23)
CO2: 23 mmol/L (ref 22–32)
Calcium: 9 mg/dL (ref 8.9–10.3)
Chloride: 104 mmol/L (ref 98–111)
Creatinine, Ser: 1.31 mg/dL — ABNORMAL HIGH (ref 0.44–1.00)
GFR, Estimated: 42 mL/min — ABNORMAL LOW (ref >60)
Glucose, Bld: 99 mg/dL (ref 70–99)
Potassium: 3.6 mmol/L (ref 3.5–5.1)
Sodium: 137 mmol/L (ref 135–145)

## 2023-12-26 LAB — VITAMIN B12: Vitamin B-12: 1072 pg/mL — ABNORMAL HIGH (ref 180–914)

## 2023-12-26 LAB — FOLATE: Folate: 36.1 ng/mL (ref >5.9)

## 2023-12-26 MED ORDER — POLYETHYLENE GLYCOL 3350 17 GM/SCOOP PO POWD: 17.0000 g | Freq: Every day | ORAL | Status: DC | PRN

## 2023-12-26 MED ORDER — FOLIC ACID 1 MG PO TABS
1.0000 mg | ORAL_TABLET | Freq: Every day | ORAL | Status: DC
  Administered 2023-12-26 – 2023-12-28 (×3): 1 mg via ORAL
  Filled 2023-12-26 (×3): qty 1

## 2023-12-26 MED ORDER — SODIUM CHLORIDE 0.9 % IV SOLN
2.0000 g | Freq: Two times a day (BID) | INTRAVENOUS | Status: DC
  Administered 2023-12-26 – 2023-12-27 (×2): 2 g via INTRAVENOUS
  Filled 2023-12-26 (×2): qty 12.5

## 2023-12-26 MED ORDER — ATORVASTATIN CALCIUM 40 MG PO TABS
40.0000 mg | ORAL_TABLET | Freq: Every day | ORAL | Status: DC
  Administered 2023-12-26 – 2023-12-27 (×2): 40 mg via ORAL
  Filled 2023-12-26 (×2): qty 1

## 2023-12-26 MED ORDER — DOCUSATE SODIUM 100 MG PO CAPS: 100.0000 mg | ORAL_CAPSULE | Freq: Two times a day (BID) | ORAL | Status: DC | PRN

## 2023-12-26 MED ORDER — PANTOPRAZOLE SODIUM 40 MG PO TBEC
40.0000 mg | DELAYED_RELEASE_TABLET | Freq: Every day | ORAL | Status: DC
  Administered 2023-12-27 – 2023-12-28 (×2): 40 mg via ORAL
  Filled 2023-12-26 (×2): qty 1

## 2023-12-26 MED ORDER — SODIUM CHLORIDE 0.9 % IV SOLN
1.0000 g | Freq: Once | INTRAVENOUS | Status: AC
  Administered 2023-12-26: 1 g via INTRAVENOUS
  Filled 2023-12-26: qty 10

## 2023-12-26 MED ORDER — PAROXETINE HCL 30 MG PO TABS
60.0000 mg | ORAL_TABLET | Freq: Every day | ORAL | Status: DC
  Administered 2023-12-26 – 2023-12-28 (×3): 60 mg via ORAL
  Filled 2023-12-26 (×3): qty 2

## 2023-12-26 MED ORDER — VALACYCLOVIR HCL 500 MG PO TABS
500.0000 mg | ORAL_TABLET | Freq: Every day | ORAL | Status: DC
  Administered 2023-12-26 – 2023-12-28 (×3): 500 mg via ORAL
  Filled 2023-12-26 (×3): qty 1

## 2023-12-26 MED ORDER — ONDANSETRON HCL 4 MG/2ML IJ SOLN
4.0000 mg | Freq: Once | INTRAMUSCULAR | Status: AC
  Administered 2023-12-26: 4 mg via INTRAVENOUS
  Filled 2023-12-26: qty 2

## 2023-12-26 MED ORDER — ARTIFICIAL TEARS OP SOLN: 1.0000 [drp] | Freq: Four times a day (QID) | OPHTHALMIC | Status: DC | PRN

## 2023-12-26 MED ORDER — POLYVINYL ALCOHOL 1.4 % OP SOLN: 1.0000 [drp] | Freq: Four times a day (QID) | OPHTHALMIC | Status: DC | PRN

## 2023-12-26 MED ORDER — VITAMIN E 45 MG (100 UNIT) PO CAPS
400.0000 [IU] | ORAL_CAPSULE | Freq: Every day | ORAL | Status: DC
  Administered 2023-12-26 – 2023-12-28 (×3): 400 [IU] via ORAL
  Filled 2023-12-26 (×3): qty 4

## 2023-12-26 MED ORDER — HYDROXYZINE HCL 25 MG PO TABS
25.0000 mg | ORAL_TABLET | Freq: Once | ORAL | Status: AC
  Administered 2023-12-26: 25 mg via ORAL
  Filled 2023-12-26: qty 1

## 2023-12-26 MED ORDER — VANCOMYCIN HCL 1250 MG/250ML IV SOLN
1250.0000 mg | INTRAVENOUS | Status: DC
  Administered 2023-12-26: 1250 mg via INTRAVENOUS
  Filled 2023-12-26 (×2): qty 250

## 2023-12-26 MED ORDER — OXYCODONE HCL 5 MG PO TABS
5.0000 mg | ORAL_TABLET | ORAL | Status: DC | PRN
  Administered 2023-12-26 – 2023-12-28 (×5): 5 mg via ORAL
  Filled 2023-12-26 (×5): qty 1

## 2023-12-26 MED ORDER — DEXAMETHASONE 0.5 MG PO TABS
1.0000 mg | ORAL_TABLET | Freq: Every day | ORAL | Status: DC
  Administered 2023-12-26 – 2023-12-28 (×3): 1 mg via ORAL
  Filled 2023-12-26 (×3): qty 2

## 2023-12-26 MED ORDER — DIPHENHYDRAMINE-ZINC ACETATE 2-0.1 % EX CREA
TOPICAL_CREAM | CUTANEOUS | Status: DC | PRN
  Administered 2023-12-26 (×2): 1 via TOPICAL
  Filled 2023-12-26: qty 28

## 2023-12-26 MED ORDER — PREDNISOLONE ACETATE 1 % OP SUSP
1.0000 [drp] | Freq: Every day | OPHTHALMIC | Status: DC
  Administered 2023-12-26 – 2023-12-28 (×3): 1 [drp] via OPHTHALMIC
  Filled 2023-12-26: qty 5

## 2023-12-26 MED ORDER — MORPHINE SULFATE (PF) 4 MG/ML IV SOLN
4.0000 mg | Freq: Once | INTRAVENOUS | Status: AC
  Administered 2023-12-26: 4 mg via INTRAVENOUS
  Filled 2023-12-26: qty 1

## 2023-12-26 MED ORDER — ALBUTEROL SULFATE (2.5 MG/3ML) 0.083% IN NEBU: 3.0000 mL | INHALATION_SOLUTION | Freq: Four times a day (QID) | RESPIRATORY_TRACT | Status: DC | PRN

## 2023-12-26 NOTE — Plan of Care (Signed)
  Problem: Education: Goal: Knowledge of General Education information will improve Description: Including pain rating scale, medication(s)/side effects and non-pharmacologic comfort measures Outcome: Progressing   Problem: Health Behavior/Discharge Planning: Goal: Ability to manage health-related needs will improve Outcome: Progressing   Problem: Health Behavior/Discharge Planning: Goal: Ability to manage health-related needs will improve Outcome: Progressing   Problem: Clinical Measurements: Goal: Respiratory complications will improve Outcome: Progressing   Problem: Nutrition: Goal: Adequate nutrition will be maintained Outcome: Progressing   Problem: Activity: Goal: Risk for activity intolerance will decrease Outcome: Progressing   Problem: Coping: Goal: Level of anxiety will decrease Outcome: Progressing   Problem: Elimination: Goal: Will not experience complications related to bowel motility Outcome: Progressing Goal: Will not experience complications related to urinary retention Outcome: Progressing   Problem: Pain Managment: Goal: General experience of comfort will improve and/or be controlled Outcome: Progressing   Problem: Safety: Goal: Ability to remain free from injury will improve Outcome: Progressing   Problem: Skin Integrity: Goal: Risk for impaired skin integrity will decrease Outcome: Progressing

## 2023-12-26 NOTE — Progress Notes (Signed)
 VASCULAR LAB    Left lower extremity venous duplex has been performed.  See CV proc for preliminary results.  Messaged negative results to Dr. Particia Nearing via secure chat  Sherren Kerns, RVT 12/26/2023, 12:23 PM

## 2023-12-26 NOTE — Progress Notes (Signed)
 Pharmacy Antibiotic Note  Autumn Conley is a 78 y.o. female admitted on 12/26/2023 with  wound infection failed doxycycline treatment .  Pharmacy has been consulted for vancomycin and cefepime dosing.  Plan: Cefepime 2g q12.  Vancomycin 1250mg  q401h eAUC 401, Vd: 0.72 (BMI: 29), Scr: 1.31 (baseline appox 0.7)  Follow culture data for de-escalation.  Monitor renal function for dose adjustments as indicated.   Height: 5\' 3"  (160 cm) Weight: 75.3 kg (166 lb 0.1 oz) IBW/kg (Calculated) : 52.4  Temp (24hrs), Avg:98.5 F (36.9 C), Min:98.5 F (36.9 C), Max:98.5 F (36.9 C)  Recent Labs  Lab 12/26/23 1102 12/26/23 1158  WBC 2.0*  --   CREATININE 1.31*  --   LATICACIDVEN  --  0.9    Estimated Creatinine Clearance: 34.4 mL/min (A) (by C-G formula based on SCr of 1.31 mg/dL (H)).    Allergies  Allergen Reactions   Latex Rash and Other (See Comments)    Blisters, also    Antimicrobials this admission: 3/8 Vancomycin >>  3/8 Cefepime >>  Ceftriaxone x1   Microbiology results: 3/8 BCx:   Thank you for allowing pharmacy to be a part of this patient's care.  Estill Batten, PharmD, BCCCP  12/26/2023 2:29 PM

## 2023-12-26 NOTE — ED Triage Notes (Addendum)
 Pt states went to UC for left leg and was dx w/cellulitis and given an oral atx and was told if it got worse to come here and it got worse. Pt has 2+ swelling of LLE. Pt has petechia surrounding the redness that was outlined by a marker at UC. Pt has 2+ left pedal pulse, cap refill greater than 3 sec, able to wiggle toes. Pt states her LLE is painful.

## 2023-12-26 NOTE — ED Provider Notes (Signed)
 [place] [facility] DEPARTMENT AT [facility] Provider Note   CSN: [id number] Arrival date & time: 12/26/23  1044     History  No chief complaint on file.   Autumn Conley is a 78 y.o. female.  Pt is a 78 yo female with pmhx significant for IBS, gerd, oa, fibromyalgia, cva, htn, and thymus cancer, breast cancer, right chest wall mass.  Pt is on chemo.  She did go to UC on 3/4 for redness to her LLE.  She was dx'd with cellulitis and put on doxy.  Pt said it is getting worse.  It is also more painful.         Home Medications Prior to Admission medications   Medication Sig Start Date End Date Taking? Authorizing Provider  albuterol (VENTOLIN HFA) 108 (90 Base) MCG/ACT inhaler Inhale 1-2 puffs into the lungs every 6 (six) hours as needed for wheezing or shortness of breath. 10/17/23  Yes Darrick Grinder, PA-C  ALPRAZolam Prudy Feeler) 0.25 MG tablet Take 0.5 tablets (0.125 mg total) by mouth daily as needed for anxiety. 02/27/20  Yes Jarold Motto, PA  Artificial Tears ophthalmic solution Place 1 drop into both eyes 4 (four) times daily as needed (DRY EYES). EVOH TEARS   Yes [provider]  atorvastatin (LIPITOR) 40 MG tablet TAKE 1 TABLET BY MOUTH AT  BEDTIME 05/22/23  Yes Worley, [place], PA  butalbital-acetaminophen-caffeine (FIORICET) 50-325-40 MG tablet TAKE 1 TO 2 TABLETS BY MOUTH EVERY 6 HOURS AS NEEDED FOR HEADACHES(S). MANUFACTURER RECOMMENDS NOT EXCEEDING 6 TABLETS PER DAY. 02/18/22  Yes Jarold Motto, PA  Cholecalciferol (VITAMIN D-3 PO) Take 1 capsule by mouth daily.   Yes [provider]  dexamethasone (DECADRON) 1 MG tablet Take 1 mg by mouth daily. 01/02/22  Yes [provider]  docusate sodium (COLACE) 100 MG capsule Take 100 mg by mouth 2 (two) times daily as needed for mild constipation or moderate constipation.   Yes [provider]  doxycycline (VIBRA-TABS) 100 MG tablet Take 100 mg by mouth 2 (two) times daily. For 10 days.  12/22/23 01/01/24 Yes [provider]  folic acid (FOLVITE) 1 MG tablet Take 1 mg by mouth daily. 07/23/21  Yes [provider]  irbesartan (AVAPRO) 150 MG tablet TAKE 1 TABLET BY MOUTH DAILY 11/09/23  Yes Jarold Motto, PA  lidocaine-prilocaine (EMLA) cream Apply 1 application. topically as needed.   Yes [provider]  mupirocin ointment (BACTROBAN) 2 % Apply to nasal area 1-2 times daily Patient taking differently: Apply 1 Application topically daily as needed (infection). 01/27/22  Yes Jarold Motto, PA  pantoprazole (PROTONIX) 40 MG tablet Take 1 tablet (40 mg total) by mouth daily before breakfast. 05/04/20  Yes Sheikh, Omair Latif, DO  PARoxetine (PAXIL) 20 MG tablet TAKE 3 TABLETS BY MOUTH IN THE  MORNING 04/09/23  Yes Bufford Buttner, [place], PA  polyethylene glycol powder (GLYCOLAX/MIRALAX) 17 GM/SCOOP powder Take 17 g by mouth daily as needed for mild constipation.   Yes [provider]  prednisoLONE acetate (PRED FORTE) 1 % ophthalmic suspension Place 1 drop into both eyes daily. 09/07/23  Yes [provider]  valACYclovir (VALTREX) 500 MG tablet TAKE 1 TABLET BY MOUTH DAILY 04/09/23  Yes Jarold Motto, PA  vitamin E 180 MG (400 UNITS) capsule Take 400 Units by mouth daily.   Yes [provider]  B-D 3CC LUER-LOK SYR 25GX1" 25G X 1" 3 ML MISC USE TO INJECT  CYANOCOBALAMIN ONCE MONTHLY 05/27/21   Jarold Motto,  PA      Allergies    Latex    Review of Systems   Review of Systems  Musculoskeletal:        LLE pain/swelling  Skin:  Positive for color change.  All other systems reviewed and are negative.   Physical Exam Updated Vital Signs BP 115/68   Pulse 94   Temp 98.6 F (37 C) (Oral)   Resp 17   Ht 5\' 3"  (1.6 m)   Wt 75.3 kg   SpO2 100%   BMI 29.41 kg/m  Physical Exam Vitals and nursing note reviewed.  Constitutional:      Appearance: Normal appearance.  HENT:     Head: Normocephalic and atraumatic.     Right Ear:  External ear normal.     Left Ear: External ear normal.     Nose: Nose normal.     Mouth/Throat:     Mouth: Mucous membranes are moist.     Pharynx: Oropharynx is clear.  Eyes:     Extraocular Movements: Extraocular movements intact.     Pupils: Pupils are equal, round, and reactive to light.  Cardiovascular:     Rate and Rhythm: Normal rate and regular rhythm.     Pulses: Normal pulses.     Heart sounds: Normal heart sounds.  Pulmonary:     Effort: Pulmonary effort is normal.     Breath sounds: Normal breath sounds.  Abdominal:     General: Abdomen is flat. Bowel sounds are normal.     Palpations: Abdomen is soft.  Musculoskeletal:        General: Normal range of motion.     Cervical back: Normal range of motion and neck supple.  Skin:    Capillary Refill: Capillary refill takes less than 2 seconds.     Comments: LLE cellulitis and swelling  Neurological:     General: No focal deficit present.     Mental Status: She is alert and oriented to person, place, and time.  Psychiatric:        Mood and Affect: Mood normal.        Behavior: Behavior normal.        ED Results / Procedures / Treatments   Labs (all labs ordered are listed, but only abnormal results are displayed) Labs Reviewed  BASIC METABOLIC PANEL - Abnormal; Notable for the following components:      Result Value   BUN 27 (*)    Creatinine, Ser 1.31 (*)    GFR, Estimated 42 (*)    All other components within normal limits  CBC WITH DIFFERENTIAL/PLATELET - Abnormal; Notable for the following components:   WBC 2.0 (*)    RBC 2.41 (*)    Hemoglobin 8.6 (*)    HCT 25.6 (*)    MCV 106.2 (*)    MCH 35.7 (*)    Platelets 86 (*)    Neutro Abs 0.8 (*)    Lymphs Abs 0.6 (*)    All other components within normal limits  CULTURE, BLOOD (ROUTINE X 2)  CULTURE, BLOOD (ROUTINE X 2)  PATHOLOGIST SMEAR REVIEW  FERRITIN  IRON AND TIBC  VITAMIN B12  FOLATE  I-STAT CG4 LACTIC ACID, ED     EKG None  Radiology VAS [place] LOWER EXTREMITY VENOUS (DVT) (7a-7p) Result Date: 12/26/2023  Lower Venous DVT Study Patient Name:  Autumn Conley  Date of Exam:   12/26/2023 Medical Rec #: [id number]    Accession #:    [id number] Date of  Birth: 1945-11-04    Patient Gender: F Patient Age:   63 years Exam Location:  [facility] Procedure:      VAS [place] LOWER EXTREMITY VENOUS (DVT) Referring Phys: Latorie Montesano --------------------------------------------------------------------------------  Indications: Pain, Swelling, Erythema, and Diagnosed with cellulitis at urgent care on Monday. Symptoms UC instructed patient to come to ED if symptoms worsen or are not getting better.  Risk Factors: History of breast and thymus cancer. Comparison Study: Prior negative bilateral LEV done 01/28/22 Performing Technologist: Sherren Kerns RVS  Examination Guidelines: A complete evaluation includes B-mode imaging, spectral Doppler, color Doppler, and power Doppler as needed of all accessible portions of each vessel. Bilateral testing is considered an integral part of a complete examination. Limited examinations for reoccurring indications may be performed as noted. The reflux portion of the exam is performed with the patient in reverse Trendelenburg.  +-----+---------------+---------+-----------+-------------------+--------------+ RIGHTCompressibilityPhasicitySpontaneityProperties         Thrombus Aging +-----+---------------+---------+-----------+-------------------+--------------+ CFV  Full           Yes      No         pulsatile waveforms               +-----+---------------+---------+-----------+-------------------+--------------+   +--------+---------------+---------+-----------+----------------+-------------+ LEFT    CompressibilityPhasicitySpontaneityProperties      Thrombus                                                                 Aging          +--------+---------------+---------+-----------+----------------+-------------+ CFV     Full           Yes      No         pulsatile                                                                waveforms                     +--------+---------------+---------+-----------+----------------+-------------+ SFJ     Full                                                             +--------+---------------+---------+-----------+----------------+-------------+ FV Prox Full                                                             +--------+---------------+---------+-----------+----------------+-------------+ FV Mid  Full           Yes      No         pulsatile  waveforms                     +--------+---------------+---------+-----------+----------------+-------------+ FV      Full           Yes      No         pulsatile                     Distal                                     waveforms                     +--------+---------------+---------+-----------+----------------+-------------+ PFV     Full                                                             +--------+---------------+---------+-----------+----------------+-------------+ POP     Full           Yes      No         pulsatile                                                                waveforms                     +--------+---------------+---------+-----------+----------------+-------------+ PTV     Full                                                             +--------+---------------+---------+-----------+----------------+-------------+ PERO    Full                                                             +--------+---------------+---------+-----------+----------------+-------------+    Summary: RIGHT: - No evidence of common femoral vein obstruction.  pulsatile waveforms  LEFT: - There is  no evidence of deep vein thrombosis in the lower extremity.  - No cystic structure found in the popliteal fossa. Pulsatile waveforms.  *See table(s) above for measurements and observations.    Preliminary     Procedures Procedures    Medications Ordered in ED Medications  albuterol (PROVENTIL) (2.5 MG/3ML) 0.083% nebulizer solution 3 mL (has no administration in time range)  Artificial Tears SOLN 1 drop (has no administration in time range)  atorvastatin (LIPITOR) tablet 40 mg (has no administration in time range)  dexamethasone (DECADRON) tablet 1 mg (has no administration in time range)  folic acid (FOLVITE) tablet 1 mg (has no administration in time range)  pantoprazole (PROTONIX) EC tablet 40 mg (has no administration in time range)  PARoxetine (PAXIL) tablet 60 mg (has no administration in time range)  prednisoLONE acetate (PRED FORTE) 1 % ophthalmic suspension 1 drop (has no administration in time range)  valACYclovir (VALTREX) tablet 500 mg (has no administration in time range)  vitamin E capsule 400 Units (has no administration in time range)  oxyCODONE (Oxy IR/ROXICODONE) immediate release tablet 5 mg (has no administration in time range)  ceFEPIme (MAXIPIME) 2 g in sodium chloride 0.9 % 100 mL IVPB (has no administration in time range)  vancomycin (VANCOREADY) IVPB 1250 mg/250 mL (has no administration in time range)  docusate sodium (COLACE) capsule 100 mg (has no administration in time range)  polyethylene glycol powder (GLYCOLAX/MIRALAX) container 17 g (has no administration in time range)  morphine (PF) 4 MG/ML injection 4 mg (4 mg Intravenous Given 12/26/23 1149)  ondansetron (ZOFRAN) injection 4 mg (4 mg Intravenous Given 12/26/23 1150)  cefTRIAXone (ROCEPHIN) 1 g in sodium chloride 0.9 % 100 mL IVPB (0 g Intravenous Stopped 12/26/23 1330)    ED Course/ Medical Decision Making/ A&P                                 Medical Decision Making Amount and/or Complexity of Data  Reviewed Labs: ordered.  Risk Prescription drug management. Decision regarding hospitalization.   This patient presents to the ED for concern of left leg pain and redness, this involves an extensive number of treatment options, and is a complaint that carries with it a high risk of complications and morbidity.  The differential diagnosis includes cellulitis, dvt   Co morbidities that complicate the patient evaluation   IBS, gerd, oa, fibromyalgia, cva, htn, and thymus cancer, breast cancer, right chest wall mass   Additional history obtained:  Additional history obtained from epic chart review External records from outside source obtained and reviewed including daughter   Lab Tests:  I Ordered, and personally interpreted labs.  The pertinent results include:  cbc with wbc low at 2, hgb low at 8.6, plt low at 86 (all new); bmp with bun 27 and cr 1.31 (stable)   Component Value Date  WBC 5.30 12/15/2023  HGB 10.2 (L) 12/15/2023  HCT 29.4 (L) 12/15/2023  PLT 181 12/15/2023  CREATININE 1.11 12/15/2023  AST 18 12/15/2023    Imaging Studies ordered:  I ordered imaging studies including [place]  I independently visualized and interpreted imaging which showed neg for DVT I agree with the radiologist interpretation   Cardiac Monitoring:  The patient was maintained on a cardiac monitor.  I personally viewed and interpreted the cardiac monitored which showed an underlying rhythm of: nsr   Medicines ordered and prescription drug management:  I ordered medication including rocephin/morphine/zofran  for sx  Reevaluation of the patient after these medicines showed that the patient improved I have reviewed the patients home medicines and have made adjustments as needed   Test Considered:  [place]   Critical Interventions:  abx   Consultations Obtained:  I requested consultation with the IMTS,  and discussed lab and imaging findings as well as pertinent plan - they will  admit   Problem List / ED Course:  LLE cellulitis: worsening despite po doxy.  Pt given iv rocephin.  Pt has failed outpatient tx and is immunosuppressed.  She will need admission for iv abx.   Reevaluation:  After the interventions noted above, I reevaluated the patient and found that they have :improved   Social  Determinants of Health:  Lives at home   Dispostion:  After consideration of the diagnostic results and the patients response to treatment, I feel that the patent would benefit from admission.          Final Clinical Impression(s) / ED Diagnoses Final diagnoses:  Pancytopenia (HCC)  Failure of outpatient treatment  Personal history of chemotherapy    Rx / DC Orders ED Discharge Orders     None         Jacalyn Lefevre, MD 12/26/23 1541

## 2023-12-26 NOTE — H&P (Signed)
 Date: 12/26/2023               Patient Name:  Autumn Conley MRN: [id number]  DOB: Aug 04, 1946 Age / Sex: 78 y.o., female   PCP: Chyrel Masson         Medical Service: Internal Medicine Teaching Service         Attending Physician: Dr. Dickie La, MD      First Contact: Dr. Faith Rogue, DO Pager [phone]    Second Contact: Dr. Champ Mungo, DO Pager [phone]         After Hours (After 5p/  First Contact Pager: [phone]  weekends / holidays): Second Contact Pager: [phone]   SUBJECTIVE   Chief Complaint: Cellulitis   History of Present Illness: Autumn Conley is a 78 year old female with a past medical history of Thymoma stage IIIA on chemotherapy, SVT s/p ablation, and HTN who presented with cellulitis of the LLE after failing outpatient therapy and is admitted for IV antibiotics.  She reports that she noticed a rash on her LLE over the weekend and went to urgent care for evaluation on Tuesday. She reported a fever Tuesday evening but denied fevers since then. At urgent care, she was placed on doxycycline and marked the cellulitis border. She reported that the cellulitis kept progressing despite the doxycyline.    Of note, she reports pancytopenia after chemotherapy. She receives chemotherapy every 3 weeks, her last dose was 02/25 per patient.   Review of Systems: A complete ROS was negative except as per HPI.   ED Course: Patient's vitals were WNL in the ED, pertient labs: Scr 1.31, Hgb 8.6, WBC 2.0, platelets 86. LLE doppler study was negative for DVT per prelim read.   Past Medical History: Thymoma stage III A on chemotherapy Breast Cancer s/p left mastectomy  SVT s/p ablation  HTN   Meds:  Current Meds  Medication Sig   albuterol (VENTOLIN HFA) 108 (90 Base) MCG/ACT inhaler Inhale 1-2 puffs into the lungs every 6 (six) hours as needed for wheezing or shortness of breath.   ALPRAZolam (XANAX) 0.25 MG tablet Take 0.5 tablets (0.125 mg total) by mouth daily as needed  for anxiety.   Artificial Tears ophthalmic solution Place 1 drop into both eyes 4 (four) times daily as needed (DRY EYES). EVOH TEARS   atorvastatin (LIPITOR) 40 MG tablet TAKE 1 TABLET BY MOUTH AT  BEDTIME   butalbital-acetaminophen-caffeine (FIORICET) 50-325-40 MG tablet TAKE 1 TO 2 TABLETS BY MOUTH EVERY 6 HOURS AS NEEDED FOR HEADACHES(S). MANUFACTURER RECOMMENDS NOT EXCEEDING 6 TABLETS PER DAY.   Cholecalciferol (VITAMIN D-3 PO) Take 1 capsule by mouth daily.   dexamethasone (DECADRON) 1 MG tablet Take 1 mg by mouth daily.   docusate sodium (COLACE) 100 MG capsule Take 100 mg by mouth 2 (two) times daily as needed for mild constipation or moderate constipation.   doxycycline (VIBRA-TABS) 100 MG tablet Take 100 mg by mouth 2 (two) times daily. For 10 days.   folic acid (FOLVITE) 1 MG tablet Take 1 mg by mouth daily.   irbesartan (AVAPRO) 150 MG tablet TAKE 1 TABLET BY MOUTH DAILY   lidocaine-prilocaine (EMLA) cream Apply 1 application. topically as needed.   mupirocin ointment (BACTROBAN) 2 % Apply to nasal area 1-2 times daily (Patient taking differently: Apply 1 Application topically daily as needed (infection).)   pantoprazole (PROTONIX) 40 MG tablet Take 1 tablet (40 mg total) by mouth daily before breakfast.   PARoxetine (PAXIL) 20 MG tablet TAKE  3 TABLETS BY MOUTH IN THE  MORNING   polyethylene glycol powder (GLYCOLAX/MIRALAX) 17 GM/SCOOP powder Take 17 g by mouth daily as needed for mild constipation.   prednisoLONE acetate (PRED FORTE) 1 % ophthalmic suspension Place 1 drop into both eyes daily.   valACYclovir (VALTREX) 500 MG tablet TAKE 1 TABLET BY MOUTH DAILY   vitamin E 180 MG (400 UNITS) capsule Take 400 Units by mouth daily.   [DISCONTINUED] HYDROcodone-acetaminophen (NORCO/VICODIN) 5-325 MG tablet Take 1 tablet by mouth every 6 (six) hours as needed for moderate pain.    Allergies: Allergies as of 12/26/2023 - Review Complete 12/26/2023  Allergen Reaction Noted   Latex Rash  and Other (See Comments) 05/01/2020    Past Surgical History:  Procedure Laterality Date   ABDOMINAL HYSTERECTOMY  1980   APPENDECTOMY  1953   BIOPSY  05/03/2020   Procedure: BIOPSY;  Surgeon: Iva Boop, MD;  Location: [facility] ENDOSCOPY;  Service: Endoscopy;;   BREAST BIOPSY Left    BREAST LUMPECTOMY Left    CATARACT EXTRACTION, BILATERAL  2018   COLONOSCOPY WITH PROPOFOL N/A 05/03/2020   Procedure: COLONOSCOPY WITH PROPOFOL;  Surgeon: Iva Boop, MD;  Location: [facility] ENDOSCOPY;  Service: Endoscopy;  Laterality: N/A;   ESOPHAGOGASTRODUODENOSCOPY (EGD) WITH PROPOFOL N/A 05/03/2020   Procedure: ESOPHAGOGASTRODUODENOSCOPY (EGD) WITH PROPOFOL;  Surgeon: Iva Boop, MD;  Location: [facility] ENDOSCOPY;  Service: Endoscopy;  Laterality: N/A;   INTERCOSTAL NERVE BLOCK Right 04/19/2021   Procedure: INTERCOSTAL NERVE BLOCK;  Surgeon: Corliss Skains, MD;  Location: [facility] OR;  Service: Thoracic;  Laterality: Right;   MASTECTOMY Left ~ 2009   PARASTERNAL EXPLORATION Right 02/14/2019   Procedure: PARASTERNAL MEDIAL EXPLORATION WITH BIOPIES.;  Surgeon: Delight Ovens, MD;  Location: [facility] OR;  Service: Thoracic;  Laterality: Right;   POLYPECTOMY  05/03/2020   Procedure: POLYPECTOMY;  Surgeon: Iva Boop, MD;  Location: [facility] ENDOSCOPY;  Service: Endoscopy;;   SVT ABLATION N/A 07/29/2023   Procedure: SVT ABLATION;  Surgeon: Marinus Maw, MD;  Location: [facility] INVASIVE CV LAB;  Service: Cardiovascular;  Laterality: N/A;    Social:  Lives at home with her husband and cat named Delorise Shiner   UJW:JXBJYN: Virgilio Belling, PA-C Substances:Denies tobacco, alcohol, or illicit substance use   Family History:  Mother: Lung cancer, smoked Aunt: Breast cancer    OBJECTIVE:   Physical Exam: Blood pressure 122/85, pulse 99, temperature 98.5 F (36.9 C), resp. rate 19, height 5\' 3"  (1.6 m), weight 75.3 kg, SpO2 100%.  Constitutional: well-appearing, sitting in bed, in no acute distress, daughter at bedside   HENT: normocephalic atraumatic, mucous membranes moist Cardiovascular: regular rate and rhythm, systolic murmur at the RUSB  Pulmonary/Chest: normal work of breathing on room air, lungs clear to auscultation bilaterally. No crackles  Abdominal: soft, non-tender, non-distended. Neurological: alert & oriented x 3 MSK: LLE is erythematous, edematous, and tender to light palpation. Unable to appreciate crepitus on exam, border of cellulitis from UC is still present on LLE, RLE is unremarkable   Skin: warm and dry Psych: Normal mood and affect, very pleasant        Labs:    Latest Ref Rng & Units 12/26/2023   11:02 AM 10/16/2023    5:23 PM 07/29/2023    6:39 AM  CBC  WBC 4.0 - 10.5 K/uL 2.0  6.5  4.9   Hemoglobin 12.0 - 15.0 g/dL 8.6  82.9  8.5   Hematocrit 36.0 - 46.0 % 25.6  31.3  28.1   Platelets 150 - 400 K/uL 86  316  283         Latest Ref Rng & Units 12/26/2023   11:02 AM 10/16/2023    5:23 PM 07/15/2023    9:43 AM  CMP  Glucose 70 - 99 mg/dL 99  829  562   BUN 8 - 23 mg/dL 27  22  18    Creatinine 0.44 - 1.00 mg/dL 1.30  8.65  7.84   Sodium 135 - 145 mmol/L 137  138  138   Potassium 3.5 - 5.1 mmol/L 3.6  3.9  4.5   Chloride 98 - 111 mmol/L 104  104  102   CO2 22 - 32 mmol/L 23  25  24    Calcium 8.9 - 10.3 mg/dL 9.0  9.2  9.3       Imaging: VAS [place] LOWER EXTREMITY VENOUS (DVT) (7a-7p) Result Date: 12/26/2023  Lower Venous DVT Study Patient Name:  ATIYA YERA  Date of Exam:   12/26/2023 Medical Rec #: [id number]    Accession #:    [id number] Date of Birth: 01/26/1946    Patient Gender: F Patient Age:   9 years Exam Location:  [facility] Procedure:      VAS [place] LOWER EXTREMITY VENOUS (DVT) Referring Phys: JULIE HAVILAND --------------------------------------------------------------------------------  Indications: Pain, Swelling, Erythema, and Diagnosed with cellulitis at urgent care on Monday. Symptoms UC instructed patient to come to ED if symptoms worsen or are not  getting better.  Risk Factors: History of breast and thymus cancer. Comparison Study: Prior negative bilateral LEV done 01/28/22 Performing Technologist: Sherren Kerns RVS  Examination Guidelines: A complete evaluation includes B-mode imaging, spectral Doppler, color Doppler, and power Doppler as needed of all accessible portions of each vessel. Bilateral testing is considered an integral part of a complete examination. Limited examinations for reoccurring indications may be performed as noted. The reflux portion of the exam is performed with the patient in reverse Trendelenburg.  +-----+---------------+---------+-----------+-------------------+--------------+ RIGHTCompressibilityPhasicitySpontaneityProperties         Thrombus Aging +-----+---------------+---------+-----------+-------------------+--------------+ CFV  Full           Yes      No         pulsatile waveforms               +-----+---------------+---------+-----------+-------------------+--------------+   +--------+---------------+---------+-----------+----------------+-------------+ LEFT    CompressibilityPhasicitySpontaneityProperties      Thrombus                                                                 Aging         +--------+---------------+---------+-----------+----------------+-------------+ CFV     Full           Yes      No         pulsatile                                                                waveforms                     +--------+---------------+---------+-----------+----------------+-------------+  SFJ     Full                                                             +--------+---------------+---------+-----------+----------------+-------------+ FV Prox Full                                                             +--------+---------------+---------+-----------+----------------+-------------+ FV Mid  Full           Yes      No         pulsatile                                                                 waveforms                     +--------+---------------+---------+-----------+----------------+-------------+ FV      Full           Yes      No         pulsatile                     Distal                                     waveforms                     +--------+---------------+---------+-----------+----------------+-------------+ PFV     Full                                                             +--------+---------------+---------+-----------+----------------+-------------+ POP     Full           Yes      No         pulsatile                                                                waveforms                     +--------+---------------+---------+-----------+----------------+-------------+ PTV     Full                                                             +--------+---------------+---------+-----------+----------------+-------------+  PERO    Full                                                             +--------+---------------+---------+-----------+----------------+-------------+    Summary: RIGHT: - No evidence of common femoral vein obstruction.  pulsatile waveforms  LEFT: - There is no evidence of deep vein thrombosis in the lower extremity.  - No cystic structure found in the popliteal fossa. Pulsatile waveforms.  *See table(s) above for measurements and observations.    Preliminary       EKG: pending   ASSESSMENT & PLAN:   Assessment & Plan by Problem: Principal Problem:   Cellulitis   Alysandra Lobue is a 78 year old female with a past medical history of Thymoma stage IIIA on chemotherapy, SVT s/p ablation who presented with cellulitis of the LLE after failing outpatient therapy and is admitted for IV antibiotics.  Cellulitis Failed outpatient therapy with doxycycline. High-risk patient with pancytopenia on chemotherapy. DVT studies negative. Plan: -Will start  vancomycin and cefepime -Trend CBC, fever curve -Blood culture  Thymoma on chemotherapy  Hx breast cancer Pancytopenia Per patient, this is chronic due to the chemotherapy treatment. She is afebrile at this time.  Plan: -Will add protective precautions for neutropenia  -F/u peripheral smear review -Will evaluate for additional causes of anemia with iron, ferritin, B12, and B9   Elevated Serum Creatinine  Patient's serum creatinine was 1.11 on 2/25. Likely due to decreased oral intake of water. Will encourage patient to increase oral intake of water. Plan: -BMP tomorrow morning -Hold home Irbesartan   HTN Blood pressure is 115/68 Plan: -Hold home irbesartan 150 mg daily due to normotensive blood pressure and elevated serum creatinine   Hx CVA Plan: -Continue home atorvastatin 40 mg daily  Anxiety Depression Plan: -Continue home paroxetine 60 mg daily   Diet: Heart Healthy VTE: None, thrombocytopenia and cellulitis present  Code: Full  Prior to Admission Living Arrangement: Home, living with husband  Anticipated Discharge Location: Home Barriers to Discharge: IV ABX   Dispo: Admit patient to Inpatient with expected length of stay greater than 2 midnights.  Signed:   Faith Rogue, DO Internal Medicine Resident PGY-1 12/26/2023, 3:00 PM   Please contact the on call pager after 5 pm and on weekends at [phone].

## 2023-12-26 NOTE — Progress Notes (Signed)
   Subjective: Autumn Conley is a 78 year old female with a past medical history of Thymoma stage IIIA on chemotherapy, SVT s/p ablation, and HTN who presented with cellulitis of the LLE after failing outpatient therapy and is admitted for IV antibiotics.   Today, patient is feeling better. She urinated throughout the night and finally fell asleep around 4am. She has some LLE pain present.   Objective:  Vital signs in last 24 hours: Vitals:   12/26/23 2007 12/27/23 0026 12/27/23 0511 12/27/23 0927  BP: (!) 91/52 (!) 107/40 (!) 123/51 117/63  Pulse: 92 93 96 96  Resp: 19 20 19 16   Temp: 99 F (37.2 C) 99.1 F (37.3 C) 98.4 F (36.9 C) 98 F (36.7 C)  TempSrc: Oral Oral Oral   SpO2: 93% 95% 100% 95%  Weight:      Height:       Physical Exam: General:NAD, daughter at bedside  Cardiac:RRR, systolic murmur present  Pulmonary:normal effort on room air  Neuro:awake, alert, participating in conversation  MSK:LLE appears less edematous today, the progression of the cellulitis remained stable, remained erythematous  Psych:  normal mood and affect   Assessment/Plan:  Principal Problem:   Cellulitis Active Problems:   HTN (hypertension)   History of breast cancer   Thymoma -- managed by [facility] - extensive documentation in Care Everywhere   Mitral valve prolapse   CHF (congestive heart failure) (HCC)  Cellulitis Failed outpatient therapy with doxycycline. High-risk patient with pancytopenia on chemotherapy. DVT studies negative. She has remained afebrile and the cellulitis is starting to improve.  Plan: -Transition to rocephin and continue vancomycin.  -Trend CBC, fever curve -Blood cultures are NGTD    Thymoma on chemotherapy  Hx breast cancer Pancytopenia Per patient, this is chronic due to the chemotherapy treatment. She remained afebrile.   Plan: -Continue protective precautions for neutropenia  -F/u peripheral smear review -Iron, ferritin, B12, and B9 are not deficit     AKI Patient's serum creatinine was 1.11 on 2/25 and it has increased to 1.45 today.  Likely due to decreased oral intake of water. Will continue to encourage patient to increase oral intake of water. Plan: -BMP tomorrow morning -Hold home Irbesartan    HTN Plan: -Hold home irbesartan 150 mg daily due to normotensive blood pressure and elevated serum creatinine     Hx CVA Plan: -Continue home atorvastatin 40 mg daily   Anxiety Depression Plan: -Continue home paroxetine 60 mg daily   Resolved Problems:  __________________________________  Code Status: Full  Diet:HH IVF:N/A Barriers to Discharge: IV antibiotics  Dispo: Anticipated discharge in approximately 2 day(s).   Faith Rogue, DO 12/27/2023, 9:44 AM Pager: [phone] After 5pm on weekdays and 1pm on weekends: On Call pager [phone]

## 2023-12-26 NOTE — Plan of Care (Signed)

## 2023-12-26 NOTE — Progress Notes (Signed)
 An IV consult was placed to IV Therapy for new IV access; pt limited to R arm only for all IVs and labs; pt stated that she "has a dual portacath but it hasn't given a blood return or worked right since it was placed."  Pt with extremely limited access; had an iv infiltrate in the right forearm area but swelling has gone down; she is on multiple antibiotics;  able to place a small 22ga for continued access, but strongly recommend a PICC for her.  Please advise.  Thank you.

## 2023-12-26 NOTE — Hospital Course (Addendum)
 Cellulitis This is a 78 year old female presenting for LLE Cellulitis after failing outpatient antibiotic therapy of Doxy, requiring IV antibiotics. High-risk patient with pancytopenia on chemotherapy. DVT studies negative. She was started on Vanc and cefepime, antibiotics de-escalated to Rocephin and Vanc. The LLE cellulitis is improving with no exudative drainage. Blood cultures negative, Will de-escalate antibiotics to Keflex. Overall improved, stable, and no fevers,    Thymoma on chemotherapy  Hx breast cancer Pancytopenia Per patient, this is chronic due to the chemotherapy treatment. She remained afebrile. We continued protective precautions for neutropenia.  Labs were checked here, ferritin 47,Iron 45, TIBC 245, saturation ratios 19, Vit 1072, folate 36.1    Elevated creatinine  Patient's Scr 1.25 on 10/16/2023, was 1.16 on 07/15/2023. Presented with Scr 1.45, not a true AKI. Likely due to decreased PO intake. We did held home Irbesartan during this hospitalization. Scr this AM 1.43, has reached a peak, plan to have patient ot follow up outpatient for Cr check.    HTN -Hold home irbesartan 150 mg daily due to normotensive blood pressure and elevated serum creatinine     Hx CVA -Continue home atorvastatin 40 mg daily   Anxiety Depression -Continue home paroxetine 60 mg daily  ---------------------------------------------------------------------------------------------------  Patient instructions:  You came to the hospital for lower leg cellulitis. We treated you with antibiotics here. I advise you to follow up with your primary care doctor.   For your Left lower Leg: - Take Keflex 500 mg 1 tablet by mouth every 6 hours or 4 times a day for 4 days. - Please follow-up with your primary care doctor for your left lower leg cellulitis.  Stop taking doxycycline  For your blood pressure -Please stop taking irbesartan 150 mg, until you have seen your primary care doctor, so they can  check your kidney functions.  Otherwise continue taking the rest of your medications as prescribed.  You have an appointment scheduled with your PCP on:  Chyrel Masson March 21st 2025  At 3:40 PM   If you have any of these following symptoms, please call us or seek care at an emergency department: -Chest Pain -Difficulty Breathing -Worsening abdominal pain -Syncope (passing out) -Drooping of face -Slurred speech -Sudden weakness in your leg or arm -Fever -Chills -blood in the stool -dark black, sticky stool  If you have any questions or concerns, call our clinic at [phone] or after hours call [phone] and ask for the internal medicine resident on call.  I am glad you are feeling better. It was a pleasure taking care for you. I wish a good recovery and good health!   Dr. Jeral Pinch    ---------------------------------------------   Left lower leg cellulitis: Patient discharged with Keflex 4 times daily for 4 days Thymoma on chemotherapy/chronic pancytopenia: Follows oncology, has an appointment on 02/02/2024.  Elevated Creatinine: Held her home Irbesartan, please check Scr prior to restarting Irbesartan.

## 2023-12-27 DIAGNOSIS — D61818 Other pancytopenia: Principal | ICD-10-CM

## 2023-12-27 DIAGNOSIS — L03116 Cellulitis of left lower limb: Secondary | ICD-10-CM | POA: Diagnosis not present

## 2023-12-27 LAB — BASIC METABOLIC PANEL
Anion gap: 9 (ref 5–15)
BUN: 28 mg/dL — ABNORMAL HIGH (ref 8–23)
CO2: 23 mmol/L (ref 22–32)
Calcium: 8.6 mg/dL — ABNORMAL LOW (ref 8.9–10.3)
Chloride: 104 mmol/L (ref 98–111)
Creatinine, Ser: 1.45 mg/dL — ABNORMAL HIGH (ref 0.44–1.00)
GFR, Estimated: 37 mL/min — ABNORMAL LOW (ref >60)
Glucose, Bld: 110 mg/dL — ABNORMAL HIGH (ref 70–99)
Potassium: 4.5 mmol/L (ref 3.5–5.1)
Sodium: 136 mmol/L (ref 135–145)

## 2023-12-27 LAB — CBC WITH DIFFERENTIAL/PLATELET
Abs Immature Granulocytes: 0.01 10*3/uL (ref 0.00–0.07)
Basophils Absolute: 0 10*3/uL (ref 0.0–0.1)
Basophils Relative: 0 %
Eosinophils Absolute: 0 10*3/uL (ref 0.0–0.5)
Eosinophils Relative: 1 %
HCT: 23.8 % — ABNORMAL LOW (ref 36.0–46.0)
Hemoglobin: 8 g/dL — ABNORMAL LOW (ref 12.0–15.0)
Immature Granulocytes: 0 %
Lymphocytes Relative: 26 %
Lymphs Abs: 0.6 10*3/uL — ABNORMAL LOW (ref 0.7–4.0)
MCH: 35.6 pg — ABNORMAL HIGH (ref 26.0–34.0)
MCHC: 33.6 g/dL (ref 30.0–36.0)
MCV: 105.8 fL — ABNORMAL HIGH (ref 80.0–100.0)
Monocytes Absolute: 0.9 10*3/uL (ref 0.1–1.0)
Monocytes Relative: 35 %
Neutro Abs: 0.9 10*3/uL — ABNORMAL LOW (ref 1.7–7.7)
Neutrophils Relative %: 38 %
Platelets: 77 10*3/uL — ABNORMAL LOW (ref 150–400)
RBC: 2.25 MIL/uL — ABNORMAL LOW (ref 3.87–5.11)
RDW: 13.4 % (ref 11.5–15.5)
Smear Review: DECREASED
WBC: 2.5 10*3/uL — ABNORMAL LOW (ref 4.0–10.5)
nRBC: 0 % (ref 0.0–0.2)

## 2023-12-27 LAB — MAGNESIUM: Magnesium: 1.8 mg/dL (ref 1.7–2.4)

## 2023-12-27 LAB — MRSA NEXT GEN BY PCR, NASAL: MRSA by PCR Next Gen: NOT DETECTED

## 2023-12-27 MED ORDER — MAGNESIUM SULFATE 2 GM/50ML IV SOLN
2.0000 g | Freq: Once | INTRAVENOUS | Status: AC
  Administered 2023-12-27: 2 g via INTRAVENOUS
  Filled 2023-12-27: qty 50

## 2023-12-27 MED ORDER — SODIUM CHLORIDE 0.9 % IV SOLN
1.0000 g | INTRAVENOUS | Status: DC
  Administered 2023-12-27 – 2023-12-28 (×2): 1 g via INTRAVENOUS
  Filled 2023-12-27 (×2): qty 10

## 2023-12-27 NOTE — Plan of Care (Signed)

## 2023-12-27 NOTE — Plan of Care (Signed)
  Problem: Clinical Measurements: Goal: Diagnostic test results will improve Outcome: Progressing   Problem: Activity: Goal: Risk for activity intolerance will decrease Outcome: Progressing   Problem: Pain Managment: Goal: General experience of comfort will improve and/or be controlled Outcome: Progressing

## 2023-12-27 NOTE — Consult Note (Signed)
 WOC Nurse Consult Note: Reason for Consult: small wounds LE and abdomen Documented on nursing flow sheets to be scabbed  Wound type: cellulitis LE; no etiology for abdominal wound Pressure Injury POA: NA Measurement:see nursing flow sheets Wound bed: scabbed  Drainage (amount, consistency, odor) none Periwound: intact, LLE edema and redness Dressing procedure/placement/frequency: Cleanse all wounds with saline, pat dry Cover with single layer of xeroform and top with foam. Change every other day.   Re consult if needed, will not follow at this time. Thanks  Carnita Golob [employer], RN,CWOCN, CNS, CWON-AP [phone])

## 2023-12-28 ENCOUNTER — Other Ambulatory Visit (HOSPITAL_COMMUNITY): Payer: Self-pay

## 2023-12-28 LAB — CBC
HCT: 22.8 % — ABNORMAL LOW (ref 36.0–46.0)
Hemoglobin: 7.8 g/dL — ABNORMAL LOW (ref 12.0–15.0)
MCH: 35.6 pg — ABNORMAL HIGH (ref 26.0–34.0)
MCHC: 34.2 g/dL (ref 30.0–36.0)
MCV: 104.1 fL — ABNORMAL HIGH (ref 80.0–100.0)
Platelets: 84 10*3/uL — ABNORMAL LOW (ref 150–400)
RBC: 2.19 MIL/uL — ABNORMAL LOW (ref 3.87–5.11)
RDW: 13.3 % (ref 11.5–15.5)
WBC: 2 10*3/uL — ABNORMAL LOW (ref 4.0–10.5)
nRBC: 0 % (ref 0.0–0.2)

## 2023-12-28 LAB — MAGNESIUM: Magnesium: 2.4 mg/dL (ref 1.7–2.4)

## 2023-12-28 LAB — BASIC METABOLIC PANEL
Anion gap: 10 (ref 5–15)
BUN: 28 mg/dL — ABNORMAL HIGH (ref 8–23)
CO2: 21 mmol/L — ABNORMAL LOW (ref 22–32)
Calcium: 8.6 mg/dL — ABNORMAL LOW (ref 8.9–10.3)
Chloride: 104 mmol/L (ref 98–111)
Creatinine, Ser: 1.43 mg/dL — ABNORMAL HIGH (ref 0.44–1.00)
GFR, Estimated: 38 mL/min — ABNORMAL LOW (ref >60)
Glucose, Bld: 100 mg/dL — ABNORMAL HIGH (ref 70–99)
Potassium: 4.1 mmol/L (ref 3.5–5.1)
Sodium: 135 mmol/L (ref 135–145)

## 2023-12-28 LAB — PATHOLOGIST SMEAR REVIEW

## 2023-12-28 MED ORDER — CEPHALEXIN 500 MG PO CAPS
500.0000 mg | ORAL_CAPSULE | Freq: Four times a day (QID) | ORAL | Status: DC
  Administered 2023-12-28: 500 mg via ORAL
  Filled 2023-12-28: qty 1

## 2023-12-28 MED ORDER — SODIUM CHLORIDE 0.9 % IV SOLN
1250.0000 mg | INTRAVENOUS | Status: DC
  Filled 2023-12-28: qty 25

## 2023-12-28 MED ORDER — CEPHALEXIN 500 MG PO CAPS
500.0000 mg | ORAL_CAPSULE | Freq: Four times a day (QID) | ORAL | 0 refills | Status: AC
  Filled 2023-12-28: qty 16, 4d supply, fill #0

## 2023-12-28 NOTE — Progress Notes (Addendum)
 Name: Autumn Conley MRN: [id number] DOB: [birth year] 78 y.o. PCP: Chyrel Masson  Date of Admission: 12/26/2023 10:47 AM Date of Discharge:  12/28/2023  Attending Physician: Dr. Cleda Daub  DISCHARGE DIAGNOSIS:  Primary Problem: Cellulitis   Hospital Problems: Principal Problem:   Cellulitis Active Problems:   HTN (hypertension)   History of breast cancer   Thymoma -- managed by [facility] - extensive documentation in Care Everywhere   Mitral valve prolapse   CHF (congestive heart failure) (HCC)   Pancytopenia (HCC)    DISCHARGE MEDICATIONS:   Allergies as of 12/28/2023       Reactions   Latex Rash, Other (See Comments)   Blisters, also        Medication List     PAUSE taking these medications    irbesartan 150 MG tablet Wait to take this until your doctor or other care provider tells you to start again. Commonly known as: AVAPRO TAKE 1 TABLET BY MOUTH DAILY       STOP taking these medications    doxycycline 100 MG tablet Commonly known as: VIBRA-TABS       TAKE these medications    albuterol 108 (90 Base) MCG/ACT inhaler Commonly known as: VENTOLIN HFA Inhale 1-2 puffs into the lungs every 6 (six) hours as needed for wheezing or shortness of breath.   ALPRAZolam 0.25 MG tablet Commonly known as: XANAX Take 0.5 tablets (0.125 mg total) by mouth daily as needed for anxiety.   Artificial Tears ophthalmic solution Place 1 drop into both eyes 4 (four) times daily as needed (DRY EYES). EVOH TEARS   atorvastatin 40 MG tablet Commonly known as: LIPITOR TAKE 1 TABLET BY MOUTH AT  BEDTIME   B-D 3CC LUER-LOK SYR 25GX1" 25G X 1" 3 ML Misc Generic drug: SYRINGE-NEEDLE (DISP) 3 ML USE TO INJECT  CYANOCOBALAMIN ONCE MONTHLY   butalbital-acetaminophen-caffeine 50-325-40 MG tablet Commonly known as: FIORICET TAKE 1 TO 2 TABLETS BY MOUTH EVERY 6 HOURS AS NEEDED FOR HEADACHES(S). MANUFACTURER RECOMMENDS NOT EXCEEDING 6 TABLETS PER DAY.   cephALEXin 500 MG  capsule Commonly known as: KEFLEX Take 1 capsule (500 mg total) by mouth every 6 (six) hours for 4 days.   dexamethasone 1 MG tablet Commonly known as: DECADRON Take 1 mg by mouth daily.   docusate sodium 100 MG capsule Commonly known as: COLACE Take 100 mg by mouth 2 (two) times daily as needed for mild constipation or moderate constipation.   folic acid 1 MG tablet Commonly known as: FOLVITE Take 1 mg by mouth daily.   lidocaine-prilocaine cream Commonly known as: EMLA Apply 1 application. topically as needed.   mupirocin ointment 2 % Commonly known as: BACTROBAN Apply to nasal area 1-2 times daily What changed:  how much to take how to take this when to take this reasons to take this additional instructions   pantoprazole 40 MG tablet Commonly known as: PROTONIX Take 1 tablet (40 mg total) by mouth daily before breakfast.   PARoxetine 20 MG tablet Commonly known as: PAXIL TAKE 3 TABLETS BY MOUTH IN THE  MORNING   polyethylene glycol powder 17 GM/SCOOP powder Commonly known as: GLYCOLAX/MIRALAX Take 17 g by mouth daily as needed for mild constipation.   prednisoLONE acetate 1 % ophthalmic suspension Commonly known as: PRED FORTE Place 1 drop into both eyes daily.   valACYclovir 500 MG tablet Commonly known as: VALTREX TAKE 1 TABLET BY MOUTH DAILY   VITAMIN D-3 PO Take 1 capsule by mouth  daily.   vitamin E 180 MG (400 UNITS) capsule Take 400 Units by mouth daily.        DISPOSITION AND FOLLOW-UP:  Autumn Conley was [facility] from [facility] in Good condition. At the hospital follow up visit please address:  Left lower leg cellulitis: Patient discharged with Keflex 4 times daily for 4 days. Please check for resolution or improvement.  Thymoma on chemotherapy/chronic pancytopenia: Follows oncology, has an appointment on 02/02/2024. Labs stable.  Elevated Creatinine: Held her home Irbesartan, please check Scr prior to restarting  Irbesartan.  Follow-up Recommendations: Consults: None  Labs: Basic Metabolic Profile and CBC Studies: None  Medications: Keflex 500 mg Q6h for 4 days   Follow-up Appointments:   Follow-up Information     Sherrie Mustache T, PA-C Follow up on 01/08/2024.   Specialty: Physician Assistant Why: At 3:40 PM Contact information: [address] [place] [address] [phone]               Chyrel Masson March 21st 2025  At 3:40 PM   HOSPITAL COURSE:  Patient Summary: Cellulitis This is a 78 year old female presenting for LLE Cellulitis after failing outpatient antibiotic therapy of Doxy, requiring IV antibiotics. High-risk patient with pancytopenia on chemotherapy. DVT studies negative. She was started on Vanc and cefepime, antibiotics de-escalated to Rocephin and Vanc. The LLE cellulitis is improving with no exudative drainage. Blood cultures negative, Will de-escalate antibiotics to Keflex. Overall improved, stable, and no fevers,    Thymoma on chemotherapy  Hx breast cancer Pancytopenia Per patient, this is chronic due to the chemotherapy treatment. She remained afebrile. We continued protective precautions for neutropenia.  Labs were checked here, ferritin 47,Iron 45, TIBC 245, saturation ratios 19, Vit 1072, folate 36.1    Elevated creatinine  Patient's Scr 1.25 on 10/16/2023, was 1.16 on 07/15/2023. Presented with Scr 1.45, not a true AKI. Likely due to decreased PO intake. We did held home Irbesartan during this hospitalization. Scr this AM 1.43, has reached a peak, plan to have patient ot follow up outpatient for Cr check.    HTN -Hold home irbesartan 150 mg daily due to normotensive blood pressure and elevated serum creatinine   Hx CVA -Continue home atorvastatin 40 mg daily   Anxiety Depression -Continue home paroxetine 60 mg daily   DISCHARGE INSTRUCTIONS:   Discharge Instructions     Diet - low sodium heart healthy   Complete by: As  directed    Discharge instructions   Complete by: As directed    You came to the hospital for Left lower leg cellulitis. We treated you with antibiotics here. I advise you to follow up with your primary care doctor.   For your Left lower Leg: - Take Keflex 500 mg 1 tablet by mouth every 6 hours or 4 times a day for 4 days. - Please follow-up with your primary care doctor for your left lower leg cellulitis.  Stop taking doxycycline  For your blood pressure -Please stop taking irbesartan, until you have seen your primary care doctor, so they can check your kidney functions.  Otherwise continue taking the rest of your medications as prescribed  If you have any of these following symptoms, please call us or seek care at an emergency department: -Chest Pain -Difficulty Breathing -Worsening abdominal pain -Syncope (passing out) -Drooping of face -Slurred speech -Sudden weakness in your leg or arm -Fever -Chills -blood in the stool -dark black, sticky stool  If  you have any questions or concerns, call our clinic at [phone] or after hours call [phone] and ask for the internal medicine resident on call.  I am glad you are feeling better. It was a pleasure taking care for you. I wish a good recovery and good health!   Dr. Jeral Pinch   Increase activity slowly   Complete by: As directed    No wound care   Complete by: As directed        SUBJECTIVE:  Patient is evaluated bedside, with husband present.  Patient reports doing well overall.  Reports minimal pain of the left lower extremity during mobility and weight bearing that resolves.  No fever or chills.  No chest pain or abdominal pain. No other concerns at this time.   Discharge Vitals:   BP 126/85 (BP Location: Right Leg)   Pulse 83   Temp 97.9 F (36.6 C) (Oral)   Resp 17   Ht 5\' 3"  (1.6 m)   Wt 75.3 kg   SpO2 100%   BMI 29.41 kg/m   OBJECTIVE:  Physical Exam   General: Laying in bed, no acute  distress, husband and daughter at bedside Cardiac: Regular rate, systolic murmur is appreciated Pulmonary: Normal work of breathing, no wheezing or crackles Abdomen: Soft, nontender, nondistended, bowel sounds present Neuro: awake, alert, no focal deficits MSK: Left lower extremity swelling/knee ROM intact, no tenderness. +Left ankle edema. +erythema overlying the anterior aspect distal LE. No exudative drainage.       Pertinent Labs, Studies, and Procedures:     Latest Ref Rng & Units 12/28/2023    5:27 AM 12/27/2023    6:47 AM 12/26/2023   11:02 AM  CBC  WBC 4.0 - 10.5 K/uL 2.0  2.5  2.0   Hemoglobin 12.0 - 15.0 g/dL 7.8  8.0  8.6   Hematocrit 36.0 - 46.0 % 22.8  23.8  25.6   Platelets 150 - 400 K/uL 84  77  86        Latest Ref Rng & Units 12/28/2023    5:27 AM 12/27/2023    6:47 AM 12/26/2023   11:02 AM  CMP  Glucose 70 - 99 mg/dL 295  621  99   BUN 8 - 23 mg/dL 28  28  27    Creatinine 0.44 - 1.00 mg/dL 3.08  6.57  8.46   Sodium 135 - 145 mmol/L 135  136  137   Potassium 3.5 - 5.1 mmol/L 4.1  4.5  3.6   Chloride 98 - 111 mmol/L 104  104  104   CO2 22 - 32 mmol/L 21  23  23    Calcium 8.9 - 10.3 mg/dL 8.6  8.6  9.0     VAS [place] LOWER EXTREMITY VENOUS (DVT) (7a-7p) Result Date: 12/27/2023  Lower Venous DVT Study Patient Name:  DYANNE YORKS  Date of Exam:   12/26/2023 Medical Rec #: [id number]    Accession #:    [id number] Date of Birth: 30-Aug-1946    Patient Gender: F Patient Age:   20 years Exam Location:  [facility] Procedure:      VAS [place] LOWER EXTREMITY VENOUS (DVT) Referring Phys: JULIE HAVILAND --------------------------------------------------------------------------------  Indications: Pain, Swelling, Erythema, and Diagnosed with cellulitis at urgent care on Monday. Symptoms UC instructed patient to come to ED if symptoms worsen or are not getting better.  Risk Factors: History of breast and thymus cancer. Comparison Study: Prior negative bilateral LEV done 01/28/22 Performing  Technologist: Sonny Masters  Rosezetta Schlatter RVS  Examination Guidelines: A complete evaluation includes B-mode imaging, spectral Doppler, color Doppler, and power Doppler as needed of all accessible portions of each vessel. Bilateral testing is considered an integral part of a complete examination. Limited examinations for reoccurring indications may be performed as noted. The reflux portion of the exam is performed with the patient in reverse Trendelenburg.  +-----+---------------+---------+-----------+-------------------+--------------+ RIGHTCompressibilityPhasicitySpontaneityProperties         Thrombus Aging +-----+---------------+---------+-----------+-------------------+--------------+ CFV  Full           Yes      No         pulsatile waveforms               +-----+---------------+---------+-----------+-------------------+--------------+   +--------+---------------+---------+-----------+----------------+-------------+ LEFT    CompressibilityPhasicitySpontaneityProperties      Thrombus                                                                 Aging         +--------+---------------+---------+-----------+----------------+-------------+ CFV     Full           Yes      No         pulsatile                                                                waveforms                     +--------+---------------+---------+-----------+----------------+-------------+ SFJ     Full                                                             +--------+---------------+---------+-----------+----------------+-------------+ FV Prox Full                                                             +--------+---------------+---------+-----------+----------------+-------------+ FV Mid  Full           Yes      No         pulsatile                                                                waveforms                      +--------+---------------+---------+-----------+----------------+-------------+ FV      Full           Yes      No  pulsatile                     Distal                                     waveforms                     +--------+---------------+---------+-----------+----------------+-------------+ PFV     Full                                                             +--------+---------------+---------+-----------+----------------+-------------+ POP     Full           Yes      No         pulsatile                                                                waveforms                     +--------+---------------+---------+-----------+----------------+-------------+ PTV     Full                                                             +--------+---------------+---------+-----------+----------------+-------------+ PERO    Full                                                             +--------+---------------+---------+-----------+----------------+-------------+     Summary: RIGHT: - No evidence of common femoral vein obstruction.  pulsatile waveforms  LEFT: - There is no evidence of deep vein thrombosis in the lower extremity.  - No cystic structure found in the popliteal fossa. Pulsatile waveforms.  *See table(s) above for measurements and observations. Electronically signed by Gerarda Fraction on 12/27/2023 at 9:49:32 AM.    Final      Signed: Jeral Pinch, D.O.  Internal Medicine Resident, PGY-1 [facility]  Pager: [phone] 1:51 PM, 12/28/2023

## 2023-12-28 NOTE — Discharge Instructions (Signed)
 Patient instructions:  You came to the hospital for lower leg cellulitis. We treated you with antibiotics here. I advise you to follow up with your primary care doctor.   For your Left lower Leg: - Take Keflex 500 mg 1 tablet by mouth every 6 hours or 4 times a day for 4 days. - Please follow-up with your primary care doctor for your left lower leg cellulitis.  Stop taking doxycycline  For your blood pressure -Please stop taking irbesartan 150 mg, until you have seen your primary care doctor, so they can check your kidney functions.  Otherwise continue taking the rest of your medications as prescribed.  You have an appointment scheduled with your PCP on:  Chyrel Masson March 21st 2025  At 3:40 PM   If you have any of these following symptoms, please call us or seek care at an emergency department: -Chest Pain -Difficulty Breathing -Worsening abdominal pain -Syncope (passing out) -Drooping of face -Slurred speech -Sudden weakness in your leg or arm -Fever -Chills -blood in the stool -dark black, sticky stool  If you have any questions or concerns, call our clinic at [phone] or after hours call [phone] and ask for the internal medicine resident on call.  I am glad you are feeling better. It was a pleasure taking care for you. I wish a good recovery and good health!   Dr. Jeral Pinch

## 2023-12-28 NOTE — TOC Initial Note (Signed)
 Transition of Care [facility]) - Initial/Assessment Note    Patient Details  Name: Autumn Conley MRN: [id number] Date of Birth: [birth year]  Transition of Care [facility]) CM/SW Contact:    Harriet Masson, RN Phone Number: 12/28/2023, 10:01 AM  Clinical Narrative:                 Spoke to patient and daughter at bedside regarding transition needs. Patient lives with spouse and daughter is 4 miles away.  Patient uses rollator at home and has a wheelchair ramp. Spouse and daughter can do dressing changes at home. Bedside RN to teach.  TOC can arranged home health RN as needed. Address, Phone number and PCP verified. TOC following.  Expected Discharge Plan: Home w Home Health Services Barriers to Discharge: Continued Medical Work up   Patient Goals and CMS Choice Patient states their goals for this hospitalization and ongoing recovery are:: return home          Expected Discharge Plan and Services   Discharge Planning Services: CM Consult   Living arrangements for the past 2 months: Single Family Home                                      Prior Living Arrangements/Services Living arrangements for the past 2 months: Single Family Home Lives with:: Spouse Patient language and need for interpreter reviewed:: Yes Do you feel safe going back to the place where you live?: Yes      Need for Family Participation in Patient Care: Yes (Comment) Care giver support system in place?: Yes (comment) Current home services: DME (rollator) Criminal Activity/Legal Involvement Pertinent to Current Situation/Hospitalization: No - Comment as needed  Activities of Daily Living   ADL Screening (condition at time of admission) Independently performs ADLs?: Yes (appropriate for developmental age) Is the patient deaf or have difficulty hearing?: Yes Does the patient have difficulty seeing, even when wearing glasses/contacts?: No Does the patient have difficulty concentrating, remembering, or  making decisions?: No  Permission Sought/Granted                  Emotional Assessment Appearance:: Appears stated age Attitude/Demeanor/Rapport: Gracious Affect (typically observed): Accepting Orientation: : Oriented to Self, Oriented to  Time, Oriented to Place, Oriented to Situation Alcohol / Substance Use: Not Applicable Psych Involvement: No (comment)  Admission diagnosis:  Cellulitis [L03.90] Personal history of chemotherapy [Z92.21] Pancytopenia (HCC) [D61.818] Failure of outpatient treatment [Z78.9] Patient Active Problem List   Diagnosis Date Noted   Pancytopenia (HCC) 12/27/2023   Cellulitis 12/26/2023   S/P lung surgery, follow-up exam    Acute on chronic diastolic CHF (congestive heart failure) (HCC) 04/18/2021   CHF (congestive heart failure) (HCC) 04/18/2021   Pericardial effusion 04/18/2021   Pleural effusion 04/18/2021   Recurrent UTI 06/28/2020   Inflammatory arthritis 05/16/2020   Erosive gastritis    Benign neoplasm of ascending colon    Heme + stool    Change in bowel habit    Anemia 05/01/2020   Dyspnea 05/01/2020   Generalized weakness 05/01/2020   UTI (urinary tract infection) 05/01/2020   Symptomatic anemia 05/01/2020   Glaucoma of both eyes    NASH (nonalcoholic steatohepatitis)    Mitral valve prolapse    Thymoma -- managed by [facility] - extensive documentation in Care Everywhere 03/22/2019   Breast cancer (HCC) 03/18/2019   Mass of upper lobe of right lung 01/20/2019  B12 deficiency 12/19/2018   Stress incontinence in female 12/19/2018   Dyslipidemia 12/14/2018   OSA (obstructive sleep apnea) 12/07/2018   History of breast cancer 12/07/2018   Cognitive complaints 11/25/2018   False positive stress test 01/29/2015   Migraine without aura and without status migrainosus, not intractable 12/18/2014   Small vessel disease, cerebrovascular 12/18/2014   GERD (gastroesophageal reflux disease) 12/02/2014   HTN (hypertension) 12/27/2013    Anxiety 12/27/2013   Depression 11/03/2012   Elevated LFTs 11/03/2012   Fibromyalgia 11/03/2012   IBS (irritable bowel syndrome) 11/03/2012   Lymphocytic colitis 11/03/2012   Paget's disease of nipple (HCC) 11/03/2012   Vitamin D deficiency 11/03/2012   Osteoarthritis 11/03/2012   PCP:  Virgilio Belling, PA-C Pharmacy:   [facility] [phone] - [place], [place] - [address] [place] [address] Phone: [phone] Fax: [phone]  [facility]) - [place], [place] - [address] [facility] [address] [place] [address] [place] [address] Phone: [phone] Fax: [phone]  [facility] Delivery - [place], [place] - [address] [place] [address] Phone: [phone] Fax: [phone]     Social Drivers of Health (SDOH) Social History: SDOH Screenings   Food Insecurity: No Food Insecurity (12/26/2023)  Housing: Low Risk  (12/26/2023)  Transportation Needs: No Transportation Needs (12/26/2023)  Utilities: Not At Risk (12/26/2023)  Depression (PHQ2-9): Low Risk  (09/17/2021)  Financial Resource Strain: Low Risk  (09/17/2021)  Physical Activity: Inactive (09/17/2021)  Social Connections: Socially Integrated (12/26/2023)  Stress: No Stress Concern Present (09/17/2021)  Tobacco Use: Low Risk  (12/26/2023)   SDOH Interventions:     Readmission Risk Interventions     No data to display

## 2023-12-28 NOTE — TOC Transition Note (Signed)
 Transition of Care [facility]) - Discharge Note   Patient Details  Name: Autumn Conley MRN: [id number] Date of Birth: 15-Oct-1946  [facility]) CM/SW Contact:  Harriet Masson, RN Phone Number: 12/28/2023, 2:00 PM   Clinical Narrative:    Patient stable for discharge.  Family to learn dressing changes.  MD informed no need for home health RN. No other TOC needs at this time.   Final next level of care: Home/Self Care Barriers to Discharge: Barriers Resolved   Patient Goals and CMS Choice Patient states their goals for this hospitalization and ongoing recovery are:: return home          Discharge Placement                     Home  Discharge Plan and Services Additional resources added to the After Visit Summary for     Discharge Planning Services: CM Consult                                 Social Drivers of Health (SDOH) Interventions SDOH Screenings   Food Insecurity: No Food Insecurity (12/26/2023)  Housing: Low Risk  (12/26/2023)  Transportation Needs: No Transportation Needs (12/26/2023)  Utilities: Not At Risk (12/26/2023)  Depression (PHQ2-9): Low Risk  (09/17/2021)  Financial Resource Strain: Low Risk  (09/17/2021)  Physical Activity: Inactive (09/17/2021)  Social Connections: Socially Integrated (12/26/2023)  Stress: No Stress Concern Present (09/17/2021)  Tobacco Use: Low Risk  (12/26/2023)     Readmission Risk Interventions    12/28/2023    2:00 PM  Readmission Risk Prevention Plan  Transportation Screening Complete  PCP or Specialist Appt within 5-7 Days Not Complete  Not Complete comments apt 3/21  Home Care Screening Complete  Medication Review (RN CM) Complete

## 2023-12-29 ENCOUNTER — Telehealth: Payer: Self-pay

## 2023-12-29 NOTE — Discharge Summary (Signed)
 Name: Autumn Conley MRN: [id number] DOB: [birth year] 78 y.o. PCP: Chyrel Masson  Date of Admission: 12/26/2023 10:47 AM Date of Discharge: 12/28/2023  Attending Physician: Dr. Cleda Daub  DISCHARGE DIAGNOSIS:  Primary Problem: Cellulitis   Hospital Problems: Principal Problem:   Cellulitis Active Problems:   HTN (hypertension)   History of breast cancer   Thymoma -- managed by [facility] - extensive documentation in Care Everywhere   Mitral valve prolapse   CHF (congestive heart failure) (HCC)   Pancytopenia (HCC)    DISCHARGE MEDICATIONS:   Allergies as of 12/28/2023       Reactions   Latex Rash, Other (See Comments)   Blisters, also        Medication List     PAUSE taking these medications    irbesartan 150 MG tablet Wait to take this until your doctor or other care provider tells you to start again. Commonly known as: AVAPRO TAKE 1 TABLET BY MOUTH DAILY       STOP taking these medications    doxycycline 100 MG tablet Commonly known as: VIBRA-TABS       TAKE these medications    albuterol 108 (90 Base) MCG/ACT inhaler Commonly known as: VENTOLIN HFA Inhale 1-2 puffs into the lungs every 6 (six) hours as needed for wheezing or shortness of breath.   ALPRAZolam 0.25 MG tablet Commonly known as: XANAX Take 0.5 tablets (0.125 mg total) by mouth daily as needed for anxiety.   Artificial Tears ophthalmic solution Place 1 drop into both eyes 4 (four) times daily as needed (DRY EYES). EVOH TEARS   atorvastatin 40 MG tablet Commonly known as: LIPITOR TAKE 1 TABLET BY MOUTH AT  BEDTIME   B-D 3CC LUER-LOK SYR 25GX1" 25G X 1" 3 ML Misc Generic drug: SYRINGE-NEEDLE (DISP) 3 ML USE TO INJECT  CYANOCOBALAMIN ONCE MONTHLY   butalbital-acetaminophen-caffeine 50-325-40 MG tablet Commonly known as: FIORICET TAKE 1 TO 2 TABLETS BY MOUTH EVERY 6 HOURS AS NEEDED FOR HEADACHES(S). MANUFACTURER RECOMMENDS NOT EXCEEDING 6 TABLETS PER DAY.   cephALEXin 500 MG  capsule Commonly known as: KEFLEX Take 1 capsule (500 mg total) by mouth every 6 (six) hours for 4 days.   dexamethasone 1 MG tablet Commonly known as: DECADRON Take 1 mg by mouth daily.   docusate sodium 100 MG capsule Commonly known as: COLACE Take 100 mg by mouth 2 (two) times daily as needed for mild constipation or moderate constipation.   folic acid 1 MG tablet Commonly known as: FOLVITE Take 1 mg by mouth daily.   lidocaine-prilocaine cream Commonly known as: EMLA Apply 1 application. topically as needed.   mupirocin ointment 2 % Commonly known as: BACTROBAN Apply to nasal area 1-2 times daily What changed:  how much to take how to take this when to take this reasons to take this additional instructions   pantoprazole 40 MG tablet Commonly known as: PROTONIX Take 1 tablet (40 mg total) by mouth daily before breakfast.   PARoxetine 20 MG tablet Commonly known as: PAXIL TAKE 3 TABLETS BY MOUTH IN THE  MORNING   polyethylene glycol powder 17 GM/SCOOP powder Commonly known as: GLYCOLAX/MIRALAX Take 17 g by mouth daily as needed for mild constipation.   prednisoLONE acetate 1 % ophthalmic suspension Commonly known as: PRED FORTE Place 1 drop into both eyes daily.   valACYclovir 500 MG tablet Commonly known as: VALTREX TAKE 1 TABLET BY MOUTH DAILY   VITAMIN D-3 PO Take 1 capsule by mouth daily.  vitamin E 180 MG (400 UNITS) capsule Take 400 Units by mouth daily.        DISPOSITION AND FOLLOW-UP:  Ms.Autumn Conley was discharged from [facility] in Good condition. At the hospital follow up visit please address:   Left lower leg cellulitis: Patient discharged with Keflex 4 times daily for 4 days. Please check for resolution or improvement.  Thymoma on chemotherapy/chronic pancytopenia: Follows oncology, has an appointment on 02/02/2024. Labs stable.  Elevated Creatinine: Held her home Irbesartan, please check Scr prior to restarting  Irbesartan.   Follow-up Recommendations: Consults: None  Labs: Basic Metabolic Profile and CBC Studies: None  Medications: Keflex 500 mg Q6h for 4 days   Follow-up Appointments:  Follow-up Information     Sherrie Mustache T, PA-C Follow up on 01/08/2024.   Specialty: Physician Assistant Why: At 3:40 PM Contact information: [address] [place] [address] [phone]                 HOSPITAL COURSE:  Cellulitis This is a 78 year old female presenting for LLE Cellulitis after failing outpatient antibiotic therapy of Doxy, requiring IV antibiotics. High-risk patient with pancytopenia on chemotherapy. DVT studies negative. She was started on Vanc and cefepime, antibiotics de-escalated to Rocephin and Vanc. The LLE cellulitis is improving with no exudative drainage. Blood cultures negative, Will de-escalate antibiotics to Keflex. Overall improved, stable, and no fevers,    Thymoma on chemotherapy  Hx breast cancer Pancytopenia Per patient, this is chronic due to the chemotherapy treatment. She remained afebrile. We continued protective precautions for neutropenia.  Labs were checked here, ferritin 47,Iron 45, TIBC 245, saturation ratios 19, Vit 1072, folate 36.1    Elevated creatinine  Patient's Scr 1.25 on 10/16/2023, was 1.16 on 07/15/2023. Presented with Scr 1.45, not a true AKI. Likely due to decreased PO intake. We did held home Irbesartan during this hospitalization. Scr this AM 1.43, has reached a peak, plan to have patient ot follow up outpatient for Cr check.    HTN -Hold home irbesartan 150 mg daily due to normotensive blood pressure and elevated serum creatinine   Hx CVA -Continue home atorvastatin 40 mg daily   Anxiety Depression -Continue home paroxetine 60 mg daily    DISCHARGE INSTRUCTIONS:   Discharge Instructions     Diet - low sodium heart healthy   Complete by: As directed    Discharge instructions   Complete by: As directed    You  came to the hospital for Left lower leg cellulitis. We treated you with antibiotics here. I advise you to follow up with your primary care doctor.   For your Left lower Leg: - Take Keflex 500 mg 1 tablet by mouth every 6 hours or 4 times a day for 4 days. - Please follow-up with your primary care doctor for your left lower leg cellulitis.  Stop taking doxycycline  For your blood pressure -Please stop taking irbesartan, until you have seen your primary care doctor, so they can check your kidney functions.  Otherwise continue taking the rest of your medications as prescribed  If you have any of these following symptoms, please call us or seek care at an emergency department: -Chest Pain -Difficulty Breathing -Worsening abdominal pain -Syncope (passing out) -Drooping of face -Slurred speech -Sudden weakness in your leg or arm -Fever -Chills -blood in the stool -dark black, sticky stool  If you have any questions or concerns, call our clinic at [phone] or after hours  call [phone] and ask for the internal medicine resident on call.  I am glad you are feeling better. It was a pleasure taking care for you. I wish a good recovery and good health!   Dr. Jeral Pinch   Increase activity slowly   Complete by: As directed    No wound care   Complete by: As directed        SUBJECTIVE:  Patient is evaluated bedside, with husband present.  Patient reports doing well overall.  Reports minimal pain of the left lower extremity during mobility and weight bearing that resolves.  No fever or chills.  No chest pain or abdominal pain. No other concerns at this time.   Discharge Vitals:   BP 126/85 (BP Location: Right Leg)   Pulse 83   Temp 97.9 F (36.6 C) (Oral)   Resp 17   Ht 5\' 3"  (1.6 m)   Wt 75.3 kg   SpO2 100%   BMI 29.41 kg/m   OBJECTIVE:  Physical Exam   General: Laying in bed, no acute distress, husband and daughter at bedside Cardiac: Regular rate, systolic  murmur is appreciated Pulmonary: Normal work of breathing, no wheezing or crackles Abdomen: Soft, nontender, nondistended, bowel sounds present Neuro: awake, alert, no focal deficits MSK: Left lower extremity swelling/knee ROM intact, no tenderness. +Left ankle edema. +erythema overlying the anterior aspect distal LE. No exudative drainage.     Pertinent Labs, Studies, and Procedures:     Latest Ref Rng & Units 12/28/2023    5:27 AM 12/27/2023    6:47 AM 12/26/2023   11:02 AM  CBC  WBC 4.0 - 10.5 K/uL 2.0  2.5  2.0   Hemoglobin 12.0 - 15.0 g/dL 7.8  8.0  8.6   Hematocrit 36.0 - 46.0 % 22.8  23.8  25.6   Platelets 150 - 400 K/uL 84  77  86        Latest Ref Rng & Units 12/28/2023    5:27 AM 12/27/2023    6:47 AM 12/26/2023   11:02 AM  CMP  Glucose 70 - 99 mg/dL 098  119  99   BUN 8 - 23 mg/dL 28  28  27    Creatinine 0.44 - 1.00 mg/dL 1.47  8.29  5.62   Sodium 135 - 145 mmol/L 135  136  137   Potassium 3.5 - 5.1 mmol/L 4.1  4.5  3.6   Chloride 98 - 111 mmol/L 104  104  104   CO2 22 - 32 mmol/L 21  23  23    Calcium 8.9 - 10.3 mg/dL 8.6  8.6  9.0     VAS [place] LOWER EXTREMITY VENOUS (DVT) (7a-7p) Result Date: 12/27/2023  Lower Venous DVT Study Patient Name:  MICHELE JUDY  Date of Exam:   12/26/2023 Medical Rec #: [id number]    Accession #:    [id number] Date of Birth: 07/25/1946    Patient Gender: F Patient Age:   21 years Exam Location:  [facility] Procedure:      VAS [place] LOWER EXTREMITY VENOUS (DVT) Referring Phys: JULIE HAVILAND --------------------------------------------------------------------------------  Indications: Pain, Swelling, Erythema, and Diagnosed with cellulitis at urgent care on Monday. Symptoms UC instructed patient to come to ED if symptoms worsen or are not getting better.  Risk Factors: History of breast and thymus cancer. Comparison Study: Prior negative bilateral LEV done 01/28/22 Performing Technologist: Sherren Kerns RVS  Examination Guidelines: A complete evaluation  includes B-mode imaging, spectral Doppler, color Doppler, and  power Doppler as needed of all accessible portions of each vessel. Bilateral testing is considered an integral part of a complete examination. Limited examinations for reoccurring indications may be performed as noted. The reflux portion of the exam is performed with the patient in reverse Trendelenburg.  +-----+---------------+---------+-----------+-------------------+--------------+ RIGHTCompressibilityPhasicitySpontaneityProperties         Thrombus Aging +-----+---------------+---------+-----------+-------------------+--------------+ CFV  Full           Yes      No         pulsatile waveforms               +-----+---------------+---------+-----------+-------------------+--------------+   +--------+---------------+---------+-----------+----------------+-------------+ LEFT    CompressibilityPhasicitySpontaneityProperties      Thrombus                                                                 Aging         +--------+---------------+---------+-----------+----------------+-------------+ CFV     Full           Yes      No         pulsatile                                                                waveforms                     +--------+---------------+---------+-----------+----------------+-------------+ SFJ     Full                                                             +--------+---------------+---------+-----------+----------------+-------------+ FV Prox Full                                                             +--------+---------------+---------+-----------+----------------+-------------+ FV Mid  Full           Yes      No         pulsatile                                                                waveforms                     +--------+---------------+---------+-----------+----------------+-------------+ FV      Full           Yes      No          pulsatile  Distal                                     waveforms                     +--------+---------------+---------+-----------+----------------+-------------+ PFV     Full                                                             +--------+---------------+---------+-----------+----------------+-------------+ POP     Full           Yes      No         pulsatile                                                                waveforms                     +--------+---------------+---------+-----------+----------------+-------------+ PTV     Full                                                             +--------+---------------+---------+-----------+----------------+-------------+ PERO    Full                                                             +--------+---------------+---------+-----------+----------------+-------------+     Summary: RIGHT: - No evidence of common femoral vein obstruction.  pulsatile waveforms  LEFT: - There is no evidence of deep vein thrombosis in the lower extremity.  - No cystic structure found in the popliteal fossa. Pulsatile waveforms.  *See table(s) above for measurements and observations. Electronically signed by Gerarda Fraction on 12/27/2023 at 9:49:32 AM.    Final      Signed: Jeral Pinch, D.O.  Internal Medicine Resident, PGY-1 [facility]  Pager: [phone] 4:30 PM, 12/29/2023

## 2023-12-29 NOTE — Telephone Encounter (Signed)
 This patient has not been seen by Dr. Pollyann Savoy.

## 2023-12-29 NOTE — Transitions of Care (Post Inpatient/ED Visit) (Signed)
   12/29/2023  Name: Autumn Conley MRN: [id number] DOB: 1946-01-30  Today's TOC FU Call Status: Today's TOC FU Call Status:: Successful TOC FU Call Completed TOC FU Call Complete Date: 12/29/23  Attempted to reach the patient regarding the most recent Inpatient/ED visit.  Follow Up Plan: No further outreach attempts will be made at this time. We have been unable to contact the patient.  Signature Karena Addison, LPN [facility] Nurse Health Advisor Direct Dial [phone]

## 2023-12-31 LAB — CULTURE, BLOOD (ROUTINE X 2)
Culture: NO GROWTH
Culture: NO GROWTH

## 2024-01-14 NOTE — Progress Notes (Signed)
 To clarify coding:  Patient has Chronic diastolic heart failure

## 2024-01-20 NOTE — Progress Notes (Signed)
 TO clarify her Pancytopenia diagnosis: Pancytopenia due to Thymoma and chemotherapy induced   Gust Rung DO

## 2024-01-27 ENCOUNTER — Encounter: Payer: Self-pay | Admitting: Internal Medicine

## 2024-01-27 ENCOUNTER — Ambulatory Visit: Payer: Medicare Other | Attending: Internal Medicine | Admitting: Internal Medicine

## 2024-01-27 VITALS — BP 142/72 | HR 106 | Wt 156.8 lb

## 2024-01-27 DIAGNOSIS — R000 Tachycardia, unspecified: Secondary | ICD-10-CM | POA: Insufficient documentation

## 2024-01-27 DIAGNOSIS — I350 Nonrheumatic aortic (valve) stenosis: Secondary | ICD-10-CM | POA: Diagnosis present

## 2024-01-27 NOTE — Progress Notes (Signed)
 " Cardiology Office Note:  .   Date:  01/27/2024  ID:  Autumn Conley, DOB [birth year], MRN [id number] PCP: Tammy Tari Autumn Conley  [facility] Providers Cardiologist:  Soyla DELENA Merck, MD    History of Present Illness: .   Autumn Conley is a 78 y.o. female.  Discussed the use of AI scribe software for clinical note transcription with the patient, who gave verbal consent to proceed.  History of Present Illness The patient, with a history of metastatic thymoma, Paget's disease of the breast, hypertension, hyperlipidemia, anxiety, IBS, fibromyalgia, and SVT with catheter ablation, presents with persistent shortness of breath. The symptom improved temporarily after her ablation procedure but has since returned. She also reports episodes of tachycardia, which she monitors at home using a personal device. The patient was recently hospitalized for left lower leg cellulitis and had an elevated creatinine level during her hospital stay. She has swelling in her feet and legs, which she attributes to her recent bout of cellulitis. The patient is due for a breast MRI due to a small breast/mediastinal growth detected in a previous scan per oncology per her report.    ROS: negative except per HPI above.  Studies Reviewed: SABRA   EKG Interpretation Date/Time:  Wednesday January 27 2024 14:36:31 EDT Ventricular Rate:  106 PR Interval:  140 QRS Duration:  88 QT Interval:  366 QTC Calculation: 486 R Axis:   21  Text Interpretation: Sinus tachycardia Possible Left atrial enlargement Possible Anterior infarct , age undetermined Confirmed by Merck Soyla ([id number]) on 01/27/2024 3:12:47 PM    Results LABS Hb: 10.2 g/dL Na: within normal limits (01/08/2024) K: within normal limits (01/08/2024) Cr: 1.19 mg/dL (96/78/7974)   DIAGNOSTIC Echocardiogram: EF 75%, mildly reduced RV function, severe mitral calcifications with trivial MR, indeterminate diastolic function, mild to moderate AI, no residual  pericardial effusion, moderate aortic valve stenosis with mean gradient of 31 mmHg (12/17/2023) Stress test: normal (2022) Risk Assessment/Calculations:          Physical Exam:   VS:  BP (!) 142/72 (BP Location: Right Arm, Patient Position: Sitting, Cuff Size: Normal)   Pulse (!) 106   Wt 156 lb 12.8 oz (71.1 kg)   SpO2 95%   BMI 27.78 kg/m    Wt Readings from Last 3 Encounters:  01/27/24 156 lb 12.8 oz (71.1 kg)  12/26/23 166 lb 0.1 oz (75.3 kg)  10/16/23 166 lb (75.3 kg)     Physical Exam VITALS: BP- 166/86 before recheck as noted above. GENERAL: Alert, cooperative, well developed, no acute distress. HEENT: Normocephalic, normal oropharynx, moist mucous membranes. CHEST: Clear to auscultation bilaterally, no wheezes, rhonchi, or crackles. CARDIOVASCULAR: Normal heart rate and rhythm, S1 and S2 normal. Mid peaking 3/6 systolic murmur present preserved S2. ABDOMEN: Soft, non-tender, non-distended, without organomegaly, normal bowel sounds. EXTREMITIES: No cyanosis or edema. NEUROLOGICAL: Cranial nerves grossly intact, moves all extremities without gross motor or sensory deficit.   ASSESSMENT AND PLAN: .    Assessment and Plan Assessment & Plan Supraventricular Tachycardia (SVT) Recurrent SVT, likely AVNRT, with heart rates 160-170 bpm on her home monitor. Previous ablation provided temporary relief of DOE. Metoprolol  ineffective and poorly tolerated historically. EP had discussed potential pacemaker if ablation not feasible. - Refer to Dr. Waddell for SVT management.  Shortness of Breath Persistent shortness of breath, possibly related to recurrent SVT. Echocardiogram shows moderate aortic stenosis and severe mitral calcifications without severe obstruction. - Consider stress test if symptoms worsen or recommended by Dr.  Waddell.  Aortic Valve Stenosis Moderate stenosis with mean gradient 31 mmHg, severe calcification likely from past chest radiation. Anticipate progression  to severe stenosis in 1-2 years. Discussed potential valve replacement. - Repeat echocardiogram in 6 months. - Discuss valve replacement if stenosis progresses to severe. - DOE far predates AV disease. Worsened by anemia previously.  Mitral Valve Calcification Severe calcification with trivial regurgitation, no stenosis. Related to past chest radiation. No intervention needed currently. - Monitor mitral valve function with echocardiogram in 6 months.  Hypertension Blood pressure 166/86 mmHg. Irbesartan  held due to elevated creatinine. Home readings variable. Patient prefers to avoid irbesartan  if possible. - Coordinate with primary care on irbesartan  management based on home readings as they are following closely.  Peripheral Edema Swelling in feet and legs likely from recent cellulitis, not cardiac. Compression stockings uncomfortable and ineffective. - Consider TED hose or alternative compression socks for edema management.      Soyla Merck, MD, [facility] "

## 2024-01-27 NOTE — Patient Instructions (Addendum)
 Medication Instructions:  No changes *If you need a refill on your cardiac medications before your next appointment, please call your pharmacy*  Lab Work: None  Testing/Procedures: Your physician has requested that you have an echocardiogram in about 6 months. Echocardiography is a painless test that uses sound waves to create images of your heart. It provides your doctor with information about the size and shape of your heart and how well your heart's chambers and valves are working. This procedure takes approximately one hour. There are no restrictions for this procedure. Please do NOT wear cologne, perfume, aftershave, or lotions (deodorant is allowed). Please arrive 15 minutes prior to your appointment time.   Follow-Up: At [facility], you and your health needs are our priority.  As part of our continuing mission to provide you with exceptional heart care, our providers are all part of one team.  This team includes your primary Cardiologist (physician) and Advanced Practice Providers or APPs (Physician Assistants and Nurse Practitioners) who all work together to provide you with the care you need, when you need it.  Your next appointment:   6 month(s) (After your Echocardiogram)  Provider:   Parke Poisson, MD   Other Instructions The EP (Dr. Kennieth Francois office) team will get in touch with you regarding a follow up visit with Dr. Ladona Ridgel for recurrent symptoms after Ablation.  If you have not heard anything in one week, please call us at [phone].  Thank you.       Valet parking services will be available as well.

## 2024-02-16 ENCOUNTER — Ambulatory Visit

## 2024-02-16 ENCOUNTER — Ambulatory Visit: Attending: Internal Medicine | Admitting: Internal Medicine

## 2024-02-16 VITALS — BP 156/82 | HR 115 | Ht 63.0 in | Wt 149.0 lb

## 2024-02-16 DIAGNOSIS — R002 Palpitations: Secondary | ICD-10-CM

## 2024-02-16 DIAGNOSIS — I10 Essential (primary) hypertension: Secondary | ICD-10-CM

## 2024-02-16 NOTE — Patient Instructions (Addendum)
 Medication Instructions:  Your physician recommends that you continue on your current medications as directed. Please refer to the Current Medication list given to you today.  *If you need a refill on your cardiac medications before your next appointment, please call your pharmacy*  Lab Work: None ordered.  You may go to any [facility] Location for your lab work:  [facility] - [address] ([facility]) - [address] [phone] [address]  [place] - [address]   [place]  - [address]   [place] - [address] - [address] [employer]  - [address] - [address] ([facility]   If you have labs (blood work) drawn today and your tests are completely normal, you will receive your results only by: [employer] (if you have MyChart)  If you have any lab test that is abnormal or we need to change your treatment, we will call you or send a MyChart message to review the results.  Testing/Procedures: Your physician has requested that you wear a Zio heart monitor for 7 days. This will be mailed to your home with instructions on how to apply the monitor and how to return it when finished. Please allow 2 weeks after returning the heart monitor before our office calls you with the results.    Follow-Up: At [facility], you and your health needs are our priority.  As part of our continuing mission to provide you with exceptional heart care, we have created designated Provider Care Teams.  These Care Teams include your primary Cardiologist (physician) and Advanced Practice Providers (APPs -  Physician Assistants and Nurse Practitioners) who all work together to provide you with the care you need, when you need it.   Your next appointment:   6 weeks  The format for your next appointment:   In Person  Provider:   Manya Sells, MD{or one of the following  Advanced Practice Providers on your designated Care Team:   Mertha Abrahams, [place] Bambi Lever "Jonelle Neri" [place], [place] Neda Balk, NP  Note: Remote monitoring is used to monitor your Pacemaker/ ICD from home. This monitoring reduces the number of office visits required to check your device to one time per year. It allows us  to keep an eye on the functioning of your device to ensure it is working properly.   ZIO XT- Long Term Monitor Instructions     Your physician has requested you wear a ZIO patch monitor for 7 days.  This is a single patch monitor. [employer] supplies one patch monitor per enrollment. Additional  stickers are not available. Please do not apply patch if you will be having a Nuclear Stress Test,  Echocardiogram, Cardiac CT, MRI, or Chest Xray during the period you would be wearing the  monitor. The patch cannot be worn during these tests. You cannot remove and re-apply the  ZIO XT patch monitor.  Your ZIO patch monitor will be mailed 3 day USPS to your address on file. It may take 3-5 days  to receive your monitor after you have been enrolled.  Once you have received your monitor, please review the enclosed instructions. Your monitor  has already been registered assigning a specific monitor serial # to you.     Billing and Patient Assistance Program Information     We have supplied [employer] with any of your insurance information  on file for billing purposes.  [employer] offers a sliding scale Patient Assistance Program for patients that do not have  insurance, or whose insurance does not completely cover the cost of the ZIO monitor.  You must apply for the Patient Assistance Program to qualify for this discounted rate.  To apply, please call [employer] at [phone], select option 4, select option 2, ask to apply for  Patient Assistance Program. Sanna Crystal will ask your household income, and how many people  are in your household. They will quote your out-of-pocket cost based on that  information.  [employer] will also be able to set up a 72-month, interest-free payment plan if needed.     Applying the monitor     Shave hair from upper left chest.  Hold abrader disc by orange tab. Rub abrader in 40 strokes over the upper left chest as  indicated in your monitor instructions.  Clean area with 4 enclosed alcohol  pads. Let dry.  Apply patch as indicated in monitor instructions. Patch will be placed under collarbone on left  side of chest with arrow pointing upward.  Rub patch adhesive wings for 2 minutes. Remove white label marked "1". Remove the white  label marked "2". Rub patch adhesive wings for 2 additional minutes.  While looking in a mirror, press and release button in center of patch. A small green light will  flash 3-4 times. This will be your only indicator that the monitor has been turned on.  Do not shower for the first 24 hours. You may shower after the first 24 hours.  Press the button if you feel a symptom. You will hear a small click. Record Date, Time and  Symptom in the Patient Logbook.  When you are ready to remove the patch, follow instructions on the last 2 pages of Patient  Logbook. Stick patch monitor onto the last page of Patient Logbook.  Place Patient Logbook in the blue and white box. Use locking tab on box and tape box closed  securely. The blue and white box has prepaid postage on it. Please place it in the mailbox as  soon as possible. Your physician should have your test results approximately 7 days after the  monitor has been mailed back to [facility].  Call [facility] Customer Care at [phone] if you have questions regarding  your ZIO XT patch monitor. Call them immediately if you see an orange light blinking on your  monitor.  If your monitor falls off in less than 4 days, contact our Monitor department at [phone].  If your monitor becomes loose or falls off after 4 days call [employer] at [phone] for  suggestions on  securing your monitor.

## 2024-02-16 NOTE — Progress Notes (Unsigned)
Enrolled for [employer] to mail a ZIO XT long term holter monitor to the patients address on file.  °Dr. Taylor to read. °

## 2024-02-16 NOTE — Progress Notes (Signed)
 HPI Autumn Conley returns today for followup. 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 The patient has developed more palpitations and her smart watch shows HR's in the 150-160 range. Sometimes these are asymptomatic. No syncope or chest pain or sob. She is presently being worked up for 2 metabolically active spots on the PET scan.  Allergies  Allergen Reactions   Latex Rash and Other (See Comments)    Blisters, also     Current Outpatient Medications  Medication Sig Dispense Refill   albuterol  (VENTOLIN  HFA) 108 (90 Base) MCG/ACT inhaler Inhale 1-2 puffs into the lungs every 6 (six) hours as needed for wheezing or shortness of breath. 1 each 0   ALPRAZolam  (XANAX ) 0.25 MG tablet Take 0.5 tablets (0.125 mg total) by mouth daily as needed for anxiety. (Patient not taking: Reported on 01/27/2024) 30 tablet 0   Artificial Tears ophthalmic solution Place 1 drop into both eyes 4 (four) times daily as needed (DRY EYES). EVOH TEARS     atorvastatin  (LIPITOR) 40 MG tablet TAKE 1 TABLET BY MOUTH AT  BEDTIME 90 tablet 3   B-D 3CC LUER-LOK SYR 25GX1" 25G X 1" 3 ML MISC USE TO INJECT  CYANOCOBALAMIN  ONCE MONTHLY 3 each 0   butalbital -acetaminophen -caffeine  (FIORICET ) 50-325-40 MG tablet TAKE 1 TO 2 TABLETS BY MOUTH EVERY 6 HOURS AS NEEDED FOR HEADACHES(S). MANUFACTURER RECOMMENDS NOT EXCEEDING 6 TABLETS PER DAY. 20 tablet 1   Cholecalciferol  (VITAMIN D -3 PO) Take 1 capsule by mouth daily.     dexamethasone  (DECADRON ) 1 MG tablet Take 1 mg by mouth daily.     docusate sodium  (COLACE) 100 MG capsule Take 100 mg by mouth 2 (two) times daily as needed for mild constipation or moderate constipation.     folic acid  (FOLVITE ) 1 MG tablet Take 1 mg by mouth daily.     [Paused] irbesartan  (AVAPRO ) 150 MG tablet TAKE 1 TABLET BY MOUTH DAILY (Patient not taking: Reported on 01/27/2024) 90 tablet 3    lidocaine -prilocaine (EMLA) cream Apply 1 application. topically as needed. (Patient not taking: Reported on 01/27/2024)     loratadine (CLARITIN) 10 MG tablet Take 10 mg by mouth daily.     mupirocin  ointment (BACTROBAN ) 2 % Apply to nasal area 1-2 times daily (Patient taking differently: Apply 1 Application topically daily as needed (infection).) 22 g 0   pantoprazole  (PROTONIX ) 40 MG tablet Take 1 tablet (40 mg total) by mouth daily before breakfast. 30 tablet 0   PARoxetine  (PAXIL ) 20 MG tablet TAKE 3 TABLETS BY MOUTH IN THE  MORNING 270 tablet 3   polyethylene glycol powder (GLYCOLAX /MIRALAX ) 17 GM/SCOOP powder Take 17 g by mouth daily as needed for mild constipation. (Patient not taking: Reported on 01/27/2024)     prednisoLONE  acetate (PRED FORTE ) 1 % ophthalmic suspension Place 1 drop into both eyes daily.     valACYclovir  (VALTREX ) 500 MG tablet TAKE 1 TABLET BY MOUTH DAILY 90 tablet 3   vitamin E  180 MG (400 UNITS) capsule Take 400 Units by mouth daily.     No current facility-administered medications for this visit.     Past Medical History:  Diagnosis Date   Anemia    Anxiety    Breast cancer (HCC)    Depression    Dyspnea    Fibromyalgia    "some; not chronic" (11/29/2014)  GERD (gastroesophageal reflux disease)    Glaucoma of both eyes    Headache    Hypertension    IBS (irritable bowel syndrome)    Mitral valve prolapse    NASH (nonalcoholic steatohepatitis)    Osteoarthritis    PONV (postoperative nausea and vomiting)    Stroke (HCC)    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(HCC)     ROS:   All systems reviewed and negative except as noted in the HPI.   Past Surgical History:  Procedure Laterality Date   ABDOMINAL HYSTERECTOMY  1980   APPENDECTOMY  1953   BIOPSY  05/03/2020   Procedure: BIOPSY;  Surgeon: Kenney Peacemaker, MD;  Location: [facility] ENDOSCOPY;  Service: Endoscopy;;   BREAST BIOPSY Left    BREAST LUMPECTOMY Left    CATARACT EXTRACTION, BILATERAL  2018   COLONOSCOPY WITH  PROPOFOL  N/A 05/03/2020   Procedure: COLONOSCOPY WITH PROPOFOL ;  Surgeon: Kenney Peacemaker, MD;  Location: [facility] ENDOSCOPY;  Service: Endoscopy;  Laterality: N/A;   ESOPHAGOGASTRODUODENOSCOPY (EGD) WITH PROPOFOL  N/A 05/03/2020   Procedure: ESOPHAGOGASTRODUODENOSCOPY (EGD) WITH PROPOFOL ;  Surgeon: Kenney Peacemaker, MD;  Location: [facility] ENDOSCOPY;  Service: Endoscopy;  Laterality: N/A;   INTERCOSTAL NERVE BLOCK Right 04/19/2021   Procedure: INTERCOSTAL NERVE BLOCK;  Surgeon: Hilarie Lovely, MD;  Location: [facility] OR;  Service: Thoracic;  Laterality: Right;   MASTECTOMY Left ~ 2009   PARASTERNAL EXPLORATION Right 02/14/2019   Procedure: PARASTERNAL MEDIAL EXPLORATION WITH BIOPIES.;  Surgeon: Norita Beauvais, MD;  Location: [facility] OR;  Service: Thoracic;  Laterality: Right;   POLYPECTOMY  05/03/2020   Procedure: POLYPECTOMY;  Surgeon: Kenney Peacemaker, MD;  Location: [facility] ENDOSCOPY;  Service: Endoscopy;;   SVT ABLATION N/A 07/29/2023   Procedure: SVT ABLATION;  Surgeon: Tammie Fall, MD;  Location: [facility] INVASIVE CV LAB;  Service: Cardiovascular;  Laterality: N/A;     Family History  Problem Relation Age of Onset   Lung cancer Mother 19   Prostate cancer Brother 29   Bladder Cancer Neg Hx    Kidney cancer Neg Hx      Social History   Socioeconomic History   Marital status: Married    Spouse name: Not on file   Number of children: 1   Years of education: 14   Highest education level: Not on file  Occupational History   Occupation: Retired  Tobacco Use   Smoking status: Never   Smokeless tobacco: Never  Vaping Use   Vaping status: Never Used  Substance and Sexual Activity   Alcohol  use: No   Drug use: No   Sexual activity: Not Currently  Other Topics Concern   Not on file  Social History Narrative   Lives at home with her husband.   Right-handed.   2 cups caffeine /day.   Social Drivers of Corporate investment banker Strain: Low Risk  (09/17/2021)   Overall Financial Resource Strain  (CARDIA)    Difficulty of Paying Living Expenses: Not hard at all  Food Insecurity: No Food Insecurity (12/26/2023)   Hunger Vital Sign    Worried About Running Out of Food in the Last Year: Never true    Ran Out of Food in the Last Year: Never true  Transportation Needs: No Transportation Needs (12/26/2023)   PRAPARE - Administrator, Civil Service (Medical): No    Lack of Transportation (Non-Medical): No  Physical Activity: Inactive (09/17/2021)   Exercise Vital Sign    Days of Exercise per Week: 0 days  Minutes of Exercise per Session: 0 min  Stress: No Stress Concern Present (09/17/2021)   [employer] of Occupational Health - Occupational Stress Questionnaire    Feeling of Stress : Not at all  Social Connections: Socially Integrated (12/26/2023)   Social Connection and Isolation Panel [NHANES]    Frequency of Communication with Friends and Family: More than three times a week    Frequency of Social Gatherings with Friends and Family: Three times a week    Attends Religious Services: More than 4 times per year    Active Member of Clubs or Organizations: Yes    Attends Banker Meetings: More than 4 times per year    Marital Status: Married  Catering manager Violence: Not At Risk (12/26/2023)   Humiliation, Afraid, Rape, and Kick questionnaire    Fear of Current or Ex-Partner: No    Emotionally Abused: No    Physically Abused: No    Sexually Abused: No     BP (!) 156/82   Pulse (!) 115   Ht 5\' 3"  (1.6 m)   Wt 149 lb (67.6 kg)   SpO2 98%   BMI 26.39 kg/m   Physical Exam:  Well appearing NAD HEENT: Unremarkable Neck:  No JVD, no thyromegally Lymphatics:  No adenopathy Back:  No CVA tenderness Lungs:  Clear with no wheezes HEART:  Regular rate rhythm, no murmurs, no rubs, no clicks Abd:  soft, positive bowel sounds, no organomegally, no rebound, no guarding Ext:  2 plus pulses, no edema, no cyanosis, no clubbing Skin:  No rashes no  nodules Neuro:  CN II through XII intact, motor grossly intact  EKG - sinus tachycardia  Assess/Plan:  SVT - she is s/p ablation of unusual AVNRT and is having more symptoms. She will wear a zio monitor. We discussed the possibility of another ablation but will hope to avoid this.  Karn [place] Willys Salvino,MD

## 2024-02-18 ENCOUNTER — Other Ambulatory Visit: Payer: Self-pay | Admitting: Physician Assistant

## 2024-03-07 ENCOUNTER — Other Ambulatory Visit (HOSPITAL_COMMUNITY)

## 2024-03-14 DIAGNOSIS — I10 Essential (primary) hypertension: Secondary | ICD-10-CM

## 2024-03-14 DIAGNOSIS — R002 Palpitations: Secondary | ICD-10-CM | POA: Diagnosis not present

## 2024-03-15 ENCOUNTER — Telehealth: Payer: Self-pay | Admitting: Internal Medicine

## 2024-03-15 NOTE — Telephone Encounter (Signed)
 Pt is calling to get Heart Monitor results

## 2024-03-15 NOTE — Telephone Encounter (Signed)
 Spoke with pt, aware monitor resultsa have not been reviewed by dr taylor yet. She does have a follow up appointment 04/06/24 with the app but she would like to see dr taylor. She reports the palpitations and SOB are severe. If she sits still she will be fine but the second she walks to the kitchen, her heart will race and she becomes very SOB. Aware will forward to dr taylor's nurse to help with scheduling.

## 2024-03-18 NOTE — Telephone Encounter (Signed)
 Spoke with Pt. Pt able to come in on 6/9 with the opening of Dr Meridith Stanford schedule.Pt does not want to see anyone but Dr.Taylor.  Pt is concerned about SOB. 911/ED precautions reviewed with pt. Pt stated understanding.

## 2024-03-24 ENCOUNTER — Ambulatory Visit: Payer: Self-pay | Admitting: Internal Medicine

## 2024-03-28 ENCOUNTER — Encounter: Payer: Self-pay | Admitting: Internal Medicine

## 2024-03-28 ENCOUNTER — Ambulatory Visit: Admitting: Internal Medicine

## 2024-03-28 ENCOUNTER — Ambulatory Visit: Attending: Internal Medicine | Admitting: Internal Medicine

## 2024-03-28 VITALS — BP 122/68 | HR 100 | Ht 63.0 in | Wt 151.4 lb

## 2024-03-28 DIAGNOSIS — I10 Essential (primary) hypertension: Secondary | ICD-10-CM | POA: Insufficient documentation

## 2024-03-28 DIAGNOSIS — I471 Supraventricular tachycardia, unspecified: Secondary | ICD-10-CM | POA: Insufficient documentation

## 2024-03-28 DIAGNOSIS — R0602 Shortness of breath: Secondary | ICD-10-CM | POA: Diagnosis not present

## 2024-03-28 DIAGNOSIS — Z79899 Other long term (current) drug therapy: Secondary | ICD-10-CM | POA: Insufficient documentation

## 2024-03-28 DIAGNOSIS — R000 Tachycardia, unspecified: Secondary | ICD-10-CM | POA: Insufficient documentation

## 2024-03-28 DIAGNOSIS — R002 Palpitations: Secondary | ICD-10-CM | POA: Insufficient documentation

## 2024-03-28 MED ORDER — METOPROLOL SUCCINATE ER 25 MG PO TB24: ORAL_TABLET | ORAL | 3 refills | Status: DC

## 2024-03-28 MED ORDER — POTASSIUM CHLORIDE ER 10 MEQ PO TBCR: 10.0000 meq | EXTENDED_RELEASE_TABLET | Freq: Every day | ORAL | 3 refills | Status: DC

## 2024-03-28 MED ORDER — FUROSEMIDE 20 MG PO TABS: 20.0000 mg | ORAL_TABLET | Freq: Every day | ORAL | 3 refills | Status: DC

## 2024-03-28 NOTE — Patient Instructions (Addendum)
 Medication Instructions:  Your physician has recommended you make the following change in your medication:   Toprol  xl 25mg  - 1/2 tablet daily for 1 week. 1 whole tablet there after  Lasix  20mg  daily.  Klor-con  10meq daily.     Lab Work: [employer] - 2 weeks  You may go to any [facility] Location for your lab work:  [facility] - [address] ([facility] [place]) - [address] [phone] [address]  [place] - [address]   [place]  - [address]   [place] - [address] - [address] [employer]  - [address] [place] - [address] ([facility]   If you have labs (blood work) drawn today and your tests are completely normal, you will receive your results only by: [employer] (if you have MyChart)  If you have any lab test that is abnormal or we need to change your treatment, we will call you or send a MyChart message to review the results.  Testing/Procedures: None ordered.  Follow-Up: At [facility], you and your health needs are our priority.  As part of our continuing mission to provide you with exceptional heart care, we have created designated Provider Care Teams.  These Care Teams include your primary Cardiologist (physician) and Advanced Practice Providers (APPs -  Physician Assistants and Nurse Practitioners) who all work together to provide you with the care you need, when you need it.  Your next appointment:   6 week follow up  The format for your next appointment:   In Person  Provider:   Manya Sells, MD

## 2024-03-28 NOTE — Progress Notes (Signed)
 HPI [name] returns today for followup. 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 Her ablation was uncomplicated and after the procedure she was no longer inducible. The patient has developed more palpitations and her smart watch shows HR's in the 150-160 range. Sometimes these are asymptomatic. No syncope or chest pain. She is presently being worked up for 2 metabolically active spots on the PET scan. FNA did not show CA. Over the past few months she notes progressively worsening exertional dyspnea. She wore a Zio monitor which showed recurrent SVT lasting up to 10 minutes at a time. She has class 3 dyspnea. She had a 2D echo which showed AS with a mean gradient around 30. She also has AI and mitrla annular Calcium  without significant MS/MR. Her EF is normal but she has diastolic dysfunction. She does not have peripheral edema.  Allergies  Allergen Reactions   Latex Rash and Other (See Comments)    Blisters, also     Current Outpatient Medications  Medication Sig Dispense Refill   albuterol  (VENTOLIN  HFA) 108 (90 Base) MCG/ACT inhaler Inhale 1-2 puffs into the lungs every 6 (six) hours as needed for wheezing or shortness of breath. 1 each 0   ALPRAZolam  (XANAX ) 0.25 MG tablet Take 0.5 tablets (0.125 mg total) by mouth daily as needed for anxiety. 30 tablet 0   Artificial Tears ophthalmic solution Place 1 drop into both eyes 4 (four) times daily as needed (DRY EYES). EVOH TEARS     atorvastatin  (LIPITOR) 40 MG tablet TAKE 1 TABLET BY MOUTH AT  BEDTIME 90 tablet 3   B-D 3CC LUER-LOK SYR 25GX1" 25G X 1" 3 ML MISC USE TO INJECT  CYANOCOBALAMIN  ONCE MONTHLY 3 each 0   butalbital -acetaminophen -caffeine  (FIORICET ) 50-325-40 MG tablet TAKE 1 TO 2 TABLETS BY MOUTH EVERY 6 HOURS AS NEEDED FOR HEADACHES(S). MANUFACTURER RECOMMENDS NOT EXCEEDING 6 TABLETS PER DAY. 20 tablet 1    Cholecalciferol  (VITAMIN D -3 PO) Take 1 capsule by mouth daily.     dexamethasone  (DECADRON ) 1 MG tablet Take 1 mg by mouth daily.     docusate sodium  (COLACE) 100 MG capsule Take 100 mg by mouth 2 (two) times daily as needed for mild constipation or moderate constipation.     folic acid  (FOLVITE ) 1 MG tablet Take 1 mg by mouth daily.     furosemide  (LASIX ) 20 MG tablet Take 1 tablet (20 mg total) by mouth daily. 90 tablet 3   [Paused] irbesartan  (AVAPRO ) 150 MG tablet TAKE 1 TABLET BY MOUTH DAILY 90 tablet 3   lidocaine -prilocaine (EMLA) cream Apply 1 application  topically as needed.     loratadine (CLARITIN) 10 MG tablet Take 10 mg by mouth daily.     metoprolol  succinate (TOPROL  XL) 25 MG 24 hr tablet Take 1/2 tablet (12.5mg ) daily for 1 week. Take 1 tablet (25mg ) daily after. 90 tablet 3   mupirocin  ointment (BACTROBAN ) 2 % Apply to nasal area 1-2 times daily (Patient taking differently: Apply 1 Application topically daily as needed (infection).) 22 g 0   pantoprazole  (PROTONIX ) 40 MG tablet Take 1 tablet (40 mg total) by mouth daily before breakfast. 30 tablet 0   PARoxetine  (PAXIL ) 20 MG tablet TAKE 3 TABLETS BY MOUTH IN THE  MORNING 270 tablet 3   polyethylene glycol powder (GLYCOLAX /MIRALAX ) 17 GM/SCOOP powder Take 17 g by  mouth daily as needed for mild constipation.     potassium chloride  (KLOR-CON ) 10 MEQ tablet Take 1 tablet (10 mEq total) by mouth daily. 90 tablet 3   prednisoLONE  acetate (PRED FORTE ) 1 % ophthalmic suspension Place 1 drop into both eyes daily.     valACYclovir  (VALTREX ) 500 MG tablet TAKE 1 TABLET BY MOUTH DAILY 90 tablet 3   vitamin E  180 MG (400 UNITS) capsule Take 400 Units by mouth daily.     No current facility-administered medications for this visit.     Past Medical History:  Diagnosis Date   Anemia    Anxiety    Breast cancer (HCC)    Depression    Dyspnea    Fibromyalgia    "some; not chronic" (11/29/2014)   GERD (gastroesophageal reflux disease)     Glaucoma of both eyes    Headache    Hypertension    IBS (irritable bowel syndrome)    Mitral valve prolapse    NASH (nonalcoholic steatohepatitis)    Osteoarthritis    PONV (postoperative nausea and vomiting)    Stroke (HCC)    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(HCC)     ROS:   All systems reviewed and negative except as noted in the HPI.   Past Surgical History:  Procedure Laterality Date   ABDOMINAL HYSTERECTOMY  1980   APPENDECTOMY  1953   BIOPSY  05/03/2020   Procedure: BIOPSY;  Surgeon: Kenney Peacemaker, MD;  Location: [facility] ENDOSCOPY;  Service: Endoscopy;;   BREAST BIOPSY Left    BREAST LUMPECTOMY Left    CATARACT EXTRACTION, BILATERAL  2018   COLONOSCOPY WITH PROPOFOL  N/A 05/03/2020   Procedure: COLONOSCOPY WITH PROPOFOL ;  Surgeon: Kenney Peacemaker, MD;  Location: [facility] ENDOSCOPY;  Service: Endoscopy;  Laterality: N/A;   ESOPHAGOGASTRODUODENOSCOPY (EGD) WITH PROPOFOL  N/A 05/03/2020   Procedure: ESOPHAGOGASTRODUODENOSCOPY (EGD) WITH PROPOFOL ;  Surgeon: Kenney Peacemaker, MD;  Location: [facility] ENDOSCOPY;  Service: Endoscopy;  Laterality: N/A;   INTERCOSTAL NERVE BLOCK Right 04/19/2021   Procedure: INTERCOSTAL NERVE BLOCK;  Surgeon: Hilarie Lovely, MD;  Location: [facility] OR;  Service: Thoracic;  Laterality: Right;   MASTECTOMY Left ~ 2009   PARASTERNAL EXPLORATION Right 02/14/2019   Procedure: PARASTERNAL MEDIAL EXPLORATION WITH BIOPIES.;  Surgeon: Norita Beauvais, MD;  Location: [facility] OR;  Service: Thoracic;  Laterality: Right;   POLYPECTOMY  05/03/2020   Procedure: POLYPECTOMY;  Surgeon: Kenney Peacemaker, MD;  Location: [facility] ENDOSCOPY;  Service: Endoscopy;;   SVT ABLATION N/A 07/29/2023   Procedure: SVT ABLATION;  Surgeon: Tammie Fall, MD;  Location: [facility] INVASIVE CV LAB;  Service: Cardiovascular;  Laterality: N/A;     Family History  Problem Relation Age of Onset   Lung cancer Mother 82   Prostate cancer Brother 41   Bladder Cancer Neg Hx    Kidney cancer Neg Hx      Social History    Socioeconomic History   Marital status: Married    Spouse name: Not on file   Number of children: 1   Years of education: 14   Highest education level: Not on file  Occupational History   Occupation: Retired  Tobacco Use   Smoking status: Never   Smokeless tobacco: Never  Vaping Use   Vaping status: Never Used  Substance and Sexual Activity   Alcohol  use: No   Drug use: No   Sexual activity: Not Currently  Other Topics Concern   Not on file  Social History Narrative   Lives at  home with her husband.   Right-handed.   2 cups caffeine /day.   Social Drivers of Corporate investment banker Strain: Low Risk  (09/17/2021)   Overall Financial Resource Strain (CARDIA)    Difficulty of Paying Living Expenses: Not hard at all  Food Insecurity: No Food Insecurity (12/26/2023)   Hunger Vital Sign    Worried About Running Out of Food in the Last Year: Never true    Ran Out of Food in the Last Year: Never true  Transportation Needs: No Transportation Needs (12/26/2023)   PRAPARE - Administrator, Civil Service (Medical): No    Lack of Transportation (Non-Medical): No  Physical Activity: Inactive (09/17/2021)   Exercise Vital Sign    Days of Exercise per Week: 0 days    Minutes of Exercise per Session: 0 min  Stress: No Stress Concern Present (09/17/2021)   [employer] of Occupational Health - Occupational Stress Questionnaire    Feeling of Stress : Not at all  Social Connections: Socially Integrated (12/26/2023)   Social Connection and Isolation Panel [NHANES]    Frequency of Communication with Friends and Family: More than three times a week    Frequency of Social Gatherings with Friends and Family: Three times a week    Attends Religious Services: More than 4 times per year    Active Member of Clubs or Organizations: Yes    Attends Banker Meetings: More than 4 times per year    Marital Status: Married  Catering manager Violence: Not At Risk  (12/26/2023)   Humiliation, Afraid, Rape, and Kick questionnaire    Fear of Current or Ex-Partner: No    Emotionally Abused: No    Physically Abused: No    Sexually Abused: No     BP 122/68   Pulse 100   Ht 5\' 3"  (1.6 m)   Wt 151 lb 6.4 oz (68.7 kg)   SpO2 95%   BMI 26.82 kg/m   Physical Exam:  Well appearing NAD HEENT: Unremarkable Neck:  No JVD, no thyromegally Lymphatics:  No adenopathy Back:  No CVA tenderness Lungs:  Clear with no wheezes or rhonchi. HEART:  Regular rate rhythm, no murmurs, no rubs, no clicks Abd:  soft, positive bowel sounds, no organomegally, no rebound, no guarding Ext:  2 plus pulses, no edema, no cyanosis, no clubbing Skin:  No rashes no nodules Neuro:  CN II through XII intact, motor grossly intact  DEVICE  Normal device function.  See PaceArt for details.   Assess/Plan:  SVT - she is s/p ablation of unusual AVNRT and is having more symptoms. She has recurent SVT. I discussed the treatment options. She has an indwelling port and a redo ablation would have an increased risk for heart block and a PPM. I have recommended a trial of low dose toprol . Dyspnea - This is her main complaint. I suspect that her AS and diastolic dysfunction are the main culprits and have recommended she take lasix  20 mg daily and start toprol  as above.  CA - she is still receiving chemo therapy thru her port.  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 She mentions that her oxygen  sat will go into the 80's with exertion.    Pete Brand Wilma Michaelson,MD

## 2024-03-30 ENCOUNTER — Other Ambulatory Visit: Payer: Self-pay | Admitting: Physician Assistant

## 2024-04-06 ENCOUNTER — Ambulatory Visit (HOSPITAL_COMMUNITY)
Admission: RE | Admit: 2024-04-06 | Discharge: 2024-04-06 | Disposition: A | Discharge status: Home / Self Care | Source: Ambulatory Visit | Attending: Internal Medicine | Admitting: Internal Medicine

## 2024-04-06 ENCOUNTER — Ambulatory Visit: Admitting: Pulmonary Disease

## 2024-04-06 DIAGNOSIS — R0602 Shortness of breath: Secondary | ICD-10-CM | POA: Diagnosis present

## 2024-04-06 LAB — PULMONARY FUNCTION TEST
DL/VA % pred: 69 %
DL/VA: 2.85 ml/min/mmHg/L
DLCO unc % pred: 32 %
DLCO unc: 6.02 ml/min/mmHg
FEF 25-75 Pre: 1.27 L/s
FEF2575-%Pred-Pre: 87 %
FEV1-%Pred-Pre: 60 %
FEV1-Pre: 1.15 L
FEV1FVC-%Pred-Pre: 114 %
FEV6-%Pred-Pre: 55 %
FEV6-Pre: 1.36 L
FEV6FVC-%Pred-Pre: 105 %
FVC-%Pred-Pre: 52 %
FVC-Pre: 1.36 L
Pre FEV1/FVC ratio: 85 %
Pre FEV6/FVC Ratio: 100 %

## 2024-04-07 ENCOUNTER — Ambulatory Visit: Payer: Self-pay | Admitting: Internal Medicine

## 2024-04-08 ENCOUNTER — Other Ambulatory Visit: Payer: Self-pay

## 2024-04-08 DIAGNOSIS — R0602 Shortness of breath: Secondary | ICD-10-CM

## 2024-04-11 ENCOUNTER — Other Ambulatory Visit: Payer: Self-pay | Admitting: *Deleted

## 2024-04-11 DIAGNOSIS — I10 Essential (primary) hypertension: Secondary | ICD-10-CM

## 2024-04-11 DIAGNOSIS — R000 Tachycardia, unspecified: Secondary | ICD-10-CM

## 2024-04-11 DIAGNOSIS — I471 Supraventricular tachycardia, unspecified: Secondary | ICD-10-CM

## 2024-04-11 DIAGNOSIS — R002 Palpitations: Secondary | ICD-10-CM

## 2024-04-11 DIAGNOSIS — Z79899 Other long term (current) drug therapy: Secondary | ICD-10-CM

## 2024-04-12 ENCOUNTER — Telehealth: Payer: Self-pay | Admitting: Internal Medicine

## 2024-04-12 DIAGNOSIS — R942 Abnormal results of pulmonary function studies: Secondary | ICD-10-CM

## 2024-04-12 LAB — BASIC METABOLIC PANEL WITH GFR
BUN/Creatinine Ratio: 24 (ref 12–28)
BUN: 35 mg/dL — ABNORMAL HIGH (ref 8–27)
CO2: 22 mmol/L (ref 20–29)
Calcium: 9.3 mg/dL (ref 8.7–10.3)
Chloride: 100 mmol/L (ref 96–106)
Creatinine, Ser: 1.44 mg/dL — ABNORMAL HIGH (ref 0.57–1.00)
Glucose: 105 mg/dL — ABNORMAL HIGH (ref 70–99)
Potassium: 4.3 mmol/L (ref 3.5–5.2)
Sodium: 137 mmol/L (ref 134–144)
eGFR: 37 mL/min/{1.73_m2} — ABNORMAL LOW (ref >59)

## 2024-04-12 NOTE — Telephone Encounter (Signed)
 Spoke with patient and shared PFT results per Dr. Waddell: Severe lung dysfunction. Please refer patient to Dr. Annella.   Referral to Dr. Annella placed. Their office will call patient to schedule appt.  Patient verbalized understanding of the above and expressed appreciation for call.

## 2024-04-12 NOTE — Telephone Encounter (Signed)
Patient is returning a call for her test results.

## 2024-04-15 ENCOUNTER — Telehealth: Payer: Self-pay

## 2024-04-15 NOTE — Telephone Encounter (Signed)
 Patient reports she was contact by Dr. Leatha office and was told he is booked up through September. She states they would not make her an appointment at this time.  Patient asked if there is anyway Dr. Waddell could talk to Dr. Annella to get her scheduled to see him before the fall. She is concerned with the summer and the heat coming how this will further impact her shortness of breath.   If unable to see Dr. Annella before September, is there another provider she could be referred to?  Patient reports she is unable to walk to the bathroom, do laundry or bend over without getting short of breath.  Will forward to Dr. Waddell to review.  Advised patient on ED precautions for worsening SOB. Patient verbalized understanding.

## 2024-04-15 NOTE — Telephone Encounter (Signed)
 Copied from CRM [id number]. Topic: Appointments - Scheduling Inquiry for Clinic >> Apr 15, 2024 11:10 AM Russell PARAS wrote: Reason for CRM:   Pt is calling in to schedule appt from referral sent by cardiologist. Referral is for [facility] specifically; however, he has not avail through the end of August, and his schedule has not been released further out.   She is concerned about waiting too long to be seen, as her breathing difficulties limit her outside activities and she struggles to breath. Her daughter is a pt of Dr. Leatha and is requesting if she could be seen sooner due to the circumstances.   CB#  [phone]   Spoke to patient and offered appt for 06/23/2024 at 10:00. I am unable to schedule, as there is a referral in place.   Front, please schedule and add pt to wait list. Thanks

## 2024-04-15 NOTE — Telephone Encounter (Signed)
 Patient stated she needs help getting an appointment with Dr. Annella scheduled before September as that's the soonest Dr. Annella can see her or see someone somewhere else.

## 2024-04-18 NOTE — Telephone Encounter (Signed)
 Spoke with pt who is asking if Dr Waddell has been able to assist with earlier appointment for Dr Annella.  Pt states she has been scheduled for September 4,2025 but does not feel she can wait that long and would like for Dr Waddell to assist with earlier appointment if possible. Pt advised Dr Waddell is out of the office until Thursday but message has been sent to his RN and she will follow up with Dr Waddell as soon as possible.  Pt thanked Charity fundraiser for the call.

## 2024-04-18 NOTE — Telephone Encounter (Signed)
Patient has been scheduled and added to wait list.

## 2024-04-18 NOTE — Telephone Encounter (Signed)
  Pt is calling back and would like to speak with Kristie

## 2024-04-19 NOTE — Telephone Encounter (Signed)
 Front, please add patient to wait list. Thank you

## 2024-04-20 ENCOUNTER — Other Ambulatory Visit: Payer: Self-pay | Admitting: Physician Assistant

## 2024-05-02 ENCOUNTER — Telehealth: Payer: Self-pay

## 2024-05-02 ENCOUNTER — Ambulatory Visit: Payer: Self-pay

## 2024-05-02 NOTE — Telephone Encounter (Signed)
 Patient was returning call that she received concerning needing a earlier appointment . Phones disconnected and I tried calling back twice but no answer . Please give patient a call back  [phone]

## 2024-05-02 NOTE — Telephone Encounter (Signed)
 FYI Only or Action Required?: FYI only for provider.  Patient is followed in Pulmonology for New Pt Referral.  Called Nurse Triage reporting Shortness of Breath and Referral.  Symptoms began about a month ago.  Interventions attempted: Other: has  been seeing cardiologist which referred to [facility].  Symptoms are: gradually worsening.  Triage Disposition: Call Specialist Now, Call PCP Now  Patient/caregiver understands and will follow disposition?: Unsure     Copied from CRM [id number]. Topic: Clinical - Red Word Triage >> May 02, 2024  4:01 PM Autumn Conley wrote: Red Word that prompted transfer to Nurse Triage: patient calling back to speak to [facility]. Agent noticed patient is short of breathe. Patient confirmed she is SOB. Reason for Disposition  [1] MILD difficulty breathing (e.g., minimal/no SOB at rest, SOB with walking, pulse < 100) AND [2] NEW-onset or WORSE than normal  Answer Assessment - Initial Assessment Questions 1. RESPIRATORY STATUS: Describe your breathing? (e.g., wheezing, shortness of breath, unable to speak, severe coughing)      SOB Recent PFT at [facility] and was advised she had severe Pulm dysfunction - pt reports that she may need supplimental O2 2. ONSET: When did this breathing problem begin?      A good long while but getting worse - about a month 3. PATTERN Does the difficult breathing come and go, or has it been constant since it started?      Comes and goes 4. SEVERITY: How bad is your breathing? (e.g., mild, moderate, severe)      Mild - with exertion Triager does appreciate audible SOB/wheezing during call. Pt is speaking in partial sentences.  5. RECURRENT SYMPTOM: Have you had difficulty breathing before? If Yes, ask: When was the last time? and What happened that time?      First time 6. CARDIAC HISTORY: Do you have any history of heart disease? (e.g., heart attack, angina, bypass surgery, angioplasty)      CHF per chart Reports  cardiac window and ablasion 7. LUNG HISTORY: Do you have any history of lung disease?  (e.g., pulmonary embolus, asthma, emphysema)     denies 8. CAUSE: What do you think is causing the breathing problem?      unknown 9. OTHER SYMPTOMS: Do you have any other symptoms? (e.g., chest pain, cough, dizziness, fever, runny nose)     Endorses baseline dizziness because I have problems with equilibrium 10. O2 SATURATION MONITOR:  Do you use an oxygen  saturation monitor (pulse oximeter) at home? If Yes, ask: What is your reading (oxygen  level) today? What is your usual oxygen  saturation reading? (e.g., 95%)       Reports low SpO2 during walk test that required O2 to recover 11. PREGNANCY: Is there any chance you are pregnant? When was your last menstrual period?       N/a 12. TRAVEL: Have you traveled out of the country in the last month? (e.g., travel history, exposures)       N/a    Pt initially calling to see if earlier referral appt (appt made in Sept) can be made and was transferred to NT d/t being symptomatic. Triager advised pt to continue following up with PCP (for acute appt tomorrow) until she can be established with [facility].  Pulmonology PAA Autumn Conley notified of pt returning call for [facility] to see if earlier referral appt can be accommodated. Autumn Conley reports that Autumn Conley will call pt back. Pt notified.  Protocols used: Breathing Difficulty-A-AH

## 2024-05-02 NOTE — Telephone Encounter (Signed)
 Copied from CRM [id number]. Topic: Appointments - Scheduling Inquiry for Clinic >> Apr 15, 2024 11:10 AM Russell PARAS wrote: Reason for CRM:   Pt is calling in to schedule appt from referral sent by cardiologist. Referral is for [facility] specifically; however, he has not avail through the end of August, and his schedule has not been released further out.   She is concerned about waiting too long to be seen, as her breathing difficulties limit her outside activities and she struggles to breath. Her daughter is a pt of Dr. Leatha and is requesting if she could be seen sooner due to the circumstances.   CB#  [phone]   Lm for patient.

## 2024-05-03 NOTE — Progress Notes (Signed)
  Electrophysiology Office Note:   Date:  05/11/2024  ID:  Antoinett Dorman, DOB [birth year], MRN [id number]  Primary Cardiologist: Soyla DELENA Merck, MD Primary Heart Failure: None Electrophysiologist: Danelle Birmingham, MD      History of Present Illness:   Autumn Conley is a 78 y.o. female with h/o SVT / AVNRT by EPS, VHD - AS with mean gradient ~30 seen today for routine electrophysiology followup.   Seen in EP Clinic 03/28/24 with recurrent SVT on cardiac monitor.  She was started on low dose Toprol .  She reported significant dyspnea at that time and low O2 saturations. PFT's were ordered with concern for XRT associated pulmonary symptoms.  PFT's suggestive of restrictive lung disease & referred to Dr. Annella.   Since last being seen in our clinic the patient reports her palpitations are significantly improved.  She had symptoms daily prior to the addition of Toprol  but now only has them occasionally, estimates 3x in the last month.  She denies chest pain, palpitations, dyspnea, PND, orthopnea, nausea, vomiting, dizziness, syncope, edema, weight gain, or early satiety.   Review of systems complete and found to be negative unless listed in HPI.   EP Information / Studies Reviewed:    EKG is not ordered today. EKG from 02/16/24 reviewed which showed ST 115 bpm       Arrhythmia / AAD SVT / AVNRT  EPS 07/29/23 > dual AV nodal physiology with easily inducible atypical AV nodal reentrant tachycardia, RF ablation of slow AV nodal pathway    Risk Assessment/Calculations:              Physical Exam:   VS:  BP 118/68   Pulse 80   Ht 5' 3 (1.6 m)   Wt 145 lb (65.8 kg)   SpO2 99%   BMI 25.69 kg/m    Wt Readings from Last 3 Encounters:  05/11/24 145 lb (65.8 kg)  03/28/24 151 lb 6.4 oz (68.7 kg)  02/16/24 149 lb (67.6 kg)     GEN: pleasant, well nourished, well developed in no acute distress NECK: No JVD; No carotid bruits CARDIAC: Regular rate and rhythm, SEM 3/6 RSB, rubs,  gallops RESPIRATORY:  Clear to auscultation without rales, wheezing or rhonchi  ABDOMEN: Soft, non-tender, non-distended EXTREMITIES:  No edema; No deformity   ASSESSMENT AND PLAN:    SVT / AVNRT  Palpitations S/p ablation 07/2023  -continue Toprol  25 mg, significant improvement in symptoms   -redo ablation would be high risk for heart block and PPM  VHD: AS  Diastolic Dysfunction  -euvolemic on exam  -per Cardiology   Dyspnea  Restrictive Lung Disease  -per Pulmonary  Follow up with Dr. Birmingham or EP APP 4 months   Signed, Daphne Barrack, NP-C, AGACNP-BC  [facility] - Electrophysiology  05/11/2024, 10:33 AM

## 2024-05-04 NOTE — Telephone Encounter (Signed)
 Lm x1 for patient.  No sooner availability with Dr. Annella. Dr. Theophilus has openings on 8/13.

## 2024-05-11 ENCOUNTER — Ambulatory Visit: Attending: Cardiovascular Disease | Admitting: Pulmonary Disease

## 2024-05-11 ENCOUNTER — Encounter: Payer: Self-pay | Admitting: Pulmonary Disease

## 2024-05-11 VITALS — BP 118/68 | HR 80 | Ht 63.0 in | Wt 145.0 lb

## 2024-05-11 DIAGNOSIS — I350 Nonrheumatic aortic (valve) stenosis: Secondary | ICD-10-CM | POA: Insufficient documentation

## 2024-05-11 DIAGNOSIS — R0602 Shortness of breath: Secondary | ICD-10-CM | POA: Insufficient documentation

## 2024-05-11 DIAGNOSIS — I471 Supraventricular tachycardia, unspecified: Secondary | ICD-10-CM | POA: Insufficient documentation

## 2024-05-11 DIAGNOSIS — I5032 Chronic diastolic (congestive) heart failure: Secondary | ICD-10-CM | POA: Diagnosis present

## 2024-05-11 DIAGNOSIS — R002 Palpitations: Secondary | ICD-10-CM | POA: Diagnosis not present

## 2024-05-11 NOTE — Patient Instructions (Addendum)
 Medication Instructions:  Your physician recommends that you continue on your current medications as directed. Please refer to the Current Medication list given to you today.  *If you need a refill on your cardiac medications before your next appointment, please call your pharmacy*  Lab Work: None ordered If you have labs (blood work) drawn today and your tests are completely normal, you will receive your results only by: MyChart Message (if you have MyChart) OR A paper copy in the mail If you have any lab test that is abnormal or we need to change your treatment, we will call you to review the results.  Follow-Up: At [facility], you and your health needs are our priority.  As part of our continuing mission to provide you with exceptional heart care, our providers are all part of one team.  This team includes your primary Cardiologist (physician) and Advanced Practice Providers or APPs (Physician Assistants and Nurse Practitioners) who all work together to provide you with the care you need, when you need it.  Your next appointment:   4 month(s)  Provider:   You may see Danelle Birmingham, MD or one of the following Advanced Practice Providers on your designated Care Team:   Charlies Arthur, PA-C Michael Andy Tillery, PA-C Suzann Riddle, NP Daphne Barrack, NP   Schedule follow up with Dr Loni per recall.

## 2024-06-01 ENCOUNTER — Ambulatory Visit: Payer: Self-pay | Admitting: Pulmonary Disease

## 2024-06-01 ENCOUNTER — Encounter: Payer: Self-pay | Admitting: Pulmonary Disease

## 2024-06-01 ENCOUNTER — Ambulatory Visit (INDEPENDENT_AMBULATORY_CARE_PROVIDER_SITE_OTHER): Admitting: Pulmonary Disease

## 2024-06-01 VITALS — BP 146/81 | HR 96 | Ht 63.0 in | Wt 143.0 lb

## 2024-06-01 DIAGNOSIS — J849 Interstitial pulmonary disease, unspecified: Secondary | ICD-10-CM

## 2024-06-01 DIAGNOSIS — R7989 Other specified abnormal findings of blood chemistry: Secondary | ICD-10-CM

## 2024-06-01 LAB — D-DIMER, QUANTITATIVE: D-Dimer, Quant: 4.84 ug{FEU}/mL — ABNORMAL HIGH (ref <0.50)

## 2024-06-01 NOTE — Progress Notes (Signed)
 Autumn Conley    [id number]    [birth year]  Primary Care Physician:Osborne, Tari DASEN, PA-C  Referring Physician: Waddell Danelle ORN, MD [address] [place],  [place] [address]  Chief complaint: Consult for dyspnea  HPI: 78 y.o. who  has a past medical history of Anemia, Anxiety, Breast cancer (HCC), Depression, Dyspnea, Fibromyalgia, GERD (gastroesophageal reflux disease), Glaucoma of both eyes, Headache, Hypertension, IBS (irritable bowel syndrome), Mitral valve prolapse, NASH (nonalcoholic steatohepatitis), Osteoarthritis, PONV (postoperative nausea and vomiting), Stroke (HCC), and 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(HCC).  Discussed the use of AI scribe software for clinical note transcription with the patient, who gave verbal consent to proceed.  History of Present Illness Autumn Conley is a 78 year old female with malignant thymoma who presents with progressive shortness of breath. She is accompanied by her husband, Autumn Conley. She was referred by her oncologist for evaluation of her breathing difficulties.  Dyspnea and oxygen  requirement - Progressive shortness of breath over several years, significantly worsened with exertion such as walking or talking - Gasping for air with minimal exertion, including walking to the bathroom or table - Dyspnea improves when sitting still - On oxygen  therapy for approximately three weeks, requiring 6 liters to maintain adequate oxygen  saturation - Previous evaluations did not qualify her for oxygen  therapy until recently - No history of smoking or known environmental exposures (e.g., mold, feather pillows)  Malignant thymoma and oncologic history - Diagnosed with malignant thymoma, receiving chemotherapy for nearly five years - Underwent radiation therapy approximately 2-3 years ago - Has received three different types of chemotherapy and continues active treatment - History of pericardial window procedure in 2022 during hospitalization at [facility] for  pericardial effusion related to thymoma - History of right pleural effusion with chest tube drainage around the same time as pericardial window  Breast cancer history - History of breast cancer over 20 years ago - Treated with mastectomy and five-year course of medication - No history of radiation therapy for breast cancer  Respiratory and chest discomfort - Discomfort on the right side, attributed to nerve damage from previous surgeries  Sleep-disordered breathing - Uses CPAP machine at night - Significant improvement in sleep quality with CPAP  Cardiac history - History of tachycardia, previously managed with metoprolol  and amlodipine - Pericardial window procedure for pericardial effusion in 2022  Renal function abnormalities - History of elevated creatinine levels, occasionally resulting in delayed chemotherapy treatments  Autoimmune disease and immunosuppression - History of autoimmune disease of unknown type - Borderline positive ANA test in 2021 - On low-dose dexamethasone  (Decadron ) 1 mg daily, prescribed by oncologist   Relevant Pulmonary history: Pets: Cat Occupation: Used to work in Presenter, broadcasting Exposures: No mold, hot tub, Financial controller.  No feather pillows or comforter No h/o chemo/XRT/amiodarone/macrodantin /MTX  No exposure to asbestos, silica or other organic allergens  Smoking history: Never smoker Travel history: No significant travel history Family history: History of lung cancer in the family.   Outpatient Encounter Medications as of 06/01/2024  Medication Sig   albuterol  (VENTOLIN  HFA) 108 (90 Base) MCG/ACT inhaler Inhale 1-2 puffs into the lungs every 6 (six) hours as needed for wheezing or shortness of breath.   ALPRAZolam  (XANAX ) 0.25 MG tablet Take 0.5 tablets (0.125 mg total) by mouth daily as needed for anxiety.   amLODipine (NORVASC) 5 MG tablet Take 5 mg by mouth daily.   Artificial Tears ophthalmic solution Place 1 drop into both eyes 4 (four)  times daily as  needed (DRY EYES). EVOH TEARS   atorvastatin  (LIPITOR) 40 MG tablet TAKE 1 TABLET BY MOUTH AT  BEDTIME   B-D 3CC LUER-LOK SYR 25GX1 25G X 1 3 ML MISC USE TO INJECT  CYANOCOBALAMIN  ONCE MONTHLY   butalbital -acetaminophen -caffeine  (FIORICET ) 50-325-40 MG tablet TAKE 1 TO 2 TABLETS BY MOUTH EVERY 6 HOURS AS NEEDED FOR HEADACHES(S). MANUFACTURER RECOMMENDS NOT EXCEEDING 6 TABLETS PER DAY.   Cholecalciferol  (VITAMIN D -3 PO) Take 1 capsule by mouth daily.   dexamethasone  (DECADRON ) 1 MG tablet Take 1 mg by mouth daily.   docusate sodium  (COLACE) 100 MG capsule Take 100 mg by mouth 2 (two) times daily as needed for mild constipation or moderate constipation.   folic acid  (FOLVITE ) 1 MG tablet Take 1 mg by mouth daily.   furosemide  (LASIX ) 20 MG tablet Take 1 tablet (20 mg total) by mouth daily.   HYDROcodone -acetaminophen  (NORCO/VICODIN) 5-325 MG tablet Take 1 tablet by mouth.   [Paused] irbesartan  (AVAPRO ) 150 MG tablet TAKE 1 TABLET BY MOUTH DAILY   lidocaine -prilocaine (EMLA) cream Apply 1 application  topically as needed.   loratadine (CLARITIN) 10 MG tablet Take 10 mg by mouth daily.   metoprolol  succinate (TOPROL  XL) 25 MG 24 hr tablet Take 1/2 tablet (12.5mg ) daily for 1 week. Take 1 tablet (25mg ) daily after.   moxifloxacin (VIGAMOX) 0.5 % ophthalmic solution Place 1 drop into the right eye 2 (two) times daily.   mupirocin  ointment (BACTROBAN ) 2 % Apply to nasal area 1-2 times daily (Patient taking differently: Apply 1 Application topically daily as needed (infection).)   pantoprazole  (PROTONIX ) 40 MG tablet Take 1 tablet (40 mg total) by mouth daily before breakfast.   PARoxetine  (PAXIL ) 20 MG tablet TAKE 3 TABLETS BY MOUTH IN THE  MORNING   polyethylene glycol powder (GLYCOLAX /MIRALAX ) 17 GM/SCOOP powder Take 17 g by mouth daily as needed for mild constipation.   potassium chloride  (KLOR-CON ) 10 MEQ tablet Take 1 tablet (10 mEq total) by mouth daily.   prednisoLONE  acetate  (PRED FORTE ) 1 % ophthalmic suspension Place 1 drop into both eyes daily.   valACYclovir  (VALTREX ) 500 MG tablet TAKE 1 TABLET BY MOUTH DAILY   vitamin E  180 MG (400 UNITS) capsule Take 400 Units by mouth daily.   No facility-administered encounter medications on file as of 06/01/2024.     Physical Exam: Today's Vitals   06/01/24 0913  BP: (!) 146/81  Pulse: 96  SpO2: 97%  Weight: 143 lb (64.9 kg)  Height: 5' 3 (1.6 m)  PF: (!) 6 L/min   Body mass index is 25.33 kg/m.  Physical Exam GEN: No acute distress. CV: Regular rate and rhythm, no murmurs. LUNGS: Clear to auscultation bilaterally, normal respiratory effort. SKIN JOINTS: Warm and dry, no rash.    Data Reviewed: Imaging: CTA 10/17/2023-no evidence of pulmonary embolism, extensive pleural thickening, small bilateral loculated effusion, soft tissue fullness in the right hemithorax, chronic architectural distortion, masslike opacities at the lower lobes  CT chest abdomen pelvis report from [facility] 04/26/2024 1.  Increased size of the small loculated left pleural effusion. No new pleural nodularity, however, evaluation is limited in absence of IV contrast.  2.  Stable right breast masses and post radiation fibrosis in the right middle lobe.  3.  Previously described nodule in the ascending colon is not seen on this noncontrast study. Again, recommend colonoscopy when clinically feasible.  4.  Unchanged size of the indeterminate right interpolar renal lesion.   PFTs: 04/06/2024 FVC 1.36 [52%],  FEV1 1.15 [60%], F/F85, DLCO 6.02 [32%] Moderate restriction, severe diffusion defect  Labs: CTD serology 05/01/2020-ANA 1: 80, nuclear speckled  Cardiac: Echocardiogram 12/17/2023 LVEF 60 to 65%, mild LVH with grade 1 diastolic dysfunction.  RV systolic function is normal Assessment & Plan Restrictive lung disease due to post-radiation and post-surgical scarring with chronic hypoxemic respiratory failure and chronic right  loculated pleural effusion Chronic shortness of breath, worsened since hospitalization in 2022. CT scan shows post-radiation changes, scarring, thickening of the lung lining, and possible nodules from thymoma. Chronic right loculated pleural effusion likely residual from 2022, difficult to drain due to scarring. Restrictive lung disease and diffusion impairment on lung function tests. No evidence of COPD or asthma. Possible autoimmune contribution, though pattern not typical. Current oxygen  therapy at 6 L/min, effective during exertion and sleep. No immediate reversible cause for shortness of breath. Consideration of steroids if inflammation is suspected. - Order blood tests for autoimmune disease and D-dimer for blood clots. - Consider CT angiogram scan with contrast if D-dimer is positive. - Continue oxygen  therapy as needed during exertion and sleep. - Review PET scan ordered by oncology results after upcoming test. - Consider increasing steroid dose if inflammation is suspected.  Malignant thymoma, ongoing chemotherapy Malignant thymoma managed with ongoing chemotherapy. Recent CT scan showed no significant changes. PET scan scheduled for further evaluation. - Review PET scan results after upcoming test.   Recommendations: CTD serologies, D-dimer  Lonna Coder MD [facility] 06/01/2024, 9:47 AM  CC: Waddell Danelle ORN, MD

## 2024-06-01 NOTE — Patient Instructions (Signed)
  VISIT SUMMARY: Today, we discussed your ongoing issues with shortness of breath and reviewed your history of malignant thymoma and other health conditions. We evaluated your current oxygen  therapy and discussed potential next steps for managing your symptoms.  YOUR PLAN: -RESTRICTIVE LUNG DISEASE DUE TO POST-RADIATION AND POST-SURGICAL SCARRING WITH CHRONIC HYPOXEMIC RESPIRATORY FAILURE AND CHRONIC RIGHT LOCULATED PLEURAL EFFUSION: This condition means that your lungs have become stiff and scarred due to previous radiation and surgeries, making it hard for you to breathe and causing low oxygen  levels. We will continue your oxygen  therapy at 6 liters per minute during exertion and sleep. We will also order blood tests to check for autoimmune diseases and blood clots. If the blood clot test is positive, we may need to do a CT scan with contrast. We will review your PET scan results after your upcoming test and consider increasing your steroid dose if inflammation is suspected.  -MALIGNANT THYMOMA, ONGOING CHEMOTHERAPY: Malignant thymoma is a type of cancer in the thymus gland, and you have been receiving chemotherapy to manage it. Your recent CT scan showed no significant changes, and you have a PET scan scheduled for further evaluation. We will review the PET scan results after your upcoming test.  INSTRUCTIONS: Please continue using your oxygen  therapy as needed during exertion and sleep. We will order blood tests to check for autoimmune diseases and blood clots. If the blood clot test is positive, we may need to do a CT scan with contrast. We will review your PET scan results after your upcoming test. If inflammation is suspected, we may consider increasing your steroid dose.

## 2024-06-02 ENCOUNTER — Telehealth: Payer: Self-pay

## 2024-06-02 ENCOUNTER — Ambulatory Visit (HOSPITAL_COMMUNITY)

## 2024-06-02 NOTE — Telephone Encounter (Signed)
 Spoke with patients husband Mr. Santiago. He stated the provider who ordered PET scan will determine if CT is necessary or not. He will give a call back if they need to schedule Autumn Conley CT. Nothing further needed at this time.

## 2024-06-02 NOTE — Telephone Encounter (Signed)
 Copied from CRM [id number]. Topic: Clinical - Medical Advice >> Jun 02, 2024 12:30 PM Nathanel DEL wrote: Reason for CRM: pt had a CT scheduled for today.   She was in another dr appt when they called to tell her about the CT However pt has a PET scan this coming Tues, 8/19 in [name]. She was told she cannot have a CT scan and a PET scan that close together. Pt would appreciate a callback to go over a few things and explain herself.  [phone]  ATC x1 LVM for patient to call our office back regarding prior message.

## 2024-06-02 NOTE — Telephone Encounter (Signed)
 Copied from CRM [id number]. Topic: Clinical - Medical Advice >> Jun 02, 2024 12:30 PM Nathanel DEL wrote: Reason for CRM: pt had a CT scheduled for today.   She was in another dr appt when they called to tell her about the CT However pt has a PET scan this coming Tues, 8/19 in [facility]. She was told she cannot have a CT scan and a PET scan that close together. Pt would appreciate a callback to go over a few things and explain herself.  [phone]

## 2024-06-02 NOTE — Telephone Encounter (Addendum)
 Copied from CRM [id number]. Topic: Clinical - Medical Advice >> Jun 02, 2024 12:30 PM Nathanel DEL wrote: Reason for CRM: pt had a CT scheduled for today.   She was in another dr appt when they called to tell her about the CT However pt has a PET scan this coming Tues, 8/19 in [facility]. She was told she cannot have a CT scan and a PET scan that close together. Pt would appreciate a callback to go over a few things and explain herself.  [phone]  Spoke with patient regarding prior message. Per Dr.Mannam patient could hold off on the PET until patient is cleared to have that done to have the CT scan done today . Patient stated she is going to hold off on her CT scan because she wants to have her PET scan done so she can have her Chemo. Patient stated she doesn't want her Creatinine to be high pr her oncologist wont let her have the chemo and patient has already missed 3 chemo visit's . Let Dr.Mannam know about this and he wanted me to let the PCC's to r/s her CT scan.  Will let patient know per Dr.Mannam advise and let PCC's know as well . Nothing else further needed.  Spoke with lynwood patient's husband to let him know what Dr.Mannam said and lynwood stated he or the patient will call our office Wednesday to let our office know what the oncologist says when will be best to have the CT scan done. James voice was understanding.Nothing else further needed.

## 2024-06-08 LAB — RFLX ATYPICAL P-ANCA TITER: ATYPICAL P ANCA TITER: 1:80 {titer} — ABNORMAL HIGH

## 2024-06-08 LAB — ANA,IFA RA DIAG PNL W/RFLX TIT/PATN
Anti Nuclear Antibody (ANA): POSITIVE — AB
Cyclic Citrullin Peptide Ab: <16 U
Rheumatoid fact SerPl-aCnc: 16 [IU]/mL — ABNORMAL HIGH (ref <14)

## 2024-06-08 LAB — ANTI-NUCLEAR AB-TITER (ANA TITER)
ANA TITER: 1:40 {titer} — ABNORMAL HIGH
ANA Titer 1: 1:320 {titer} — ABNORMAL HIGH

## 2024-06-08 LAB — ANCA SCREEN W REFLEX TITER: ANCA SCREEN: POSITIVE — AB

## 2024-06-08 LAB — SJOGRENS SYNDROME-B EXTRACTABLE NUCLEAR ANTIBODY: SSB (La) (ENA) Antibody, IgG: <1 AI

## 2024-06-08 LAB — ANTI-SCLERODERMA ANTIBODY: Scleroderma (Scl-70) (ENA) Antibody, IgG: <1 AI

## 2024-06-08 LAB — SJOGRENS SYNDROME-A EXTRACTABLE NUCLEAR ANTIBODY: SSA (Ro) (ENA) Antibody, IgG: >8 AI — AB

## 2024-06-14 NOTE — Telephone Encounter (Signed)
 Copied from CRM [id number]. Topic: Appointments - Appointment Scheduling >> Jun 14, 2024  8:57 AM Devaughn RAMAN wrote: Patient calling in to speak with Consuelo regarding scheduling her CT scan, contacted [facility] spoke with Gwyndolyn Consuelo currently with patients. Advised patient Consuelo would follow up with her regarding scheduling CT Scan. Patient was thankful and expressed no other needs at this time.

## 2024-06-16 LAB — MYOMARKER 3 PLUS PROFILE (RDL)
Anti-EJ Ab (RDL): NEGATIVE
Anti-Jo-1 Ab (RDL): <20 U (ref <20)
Anti-Ku Ab (RDL): NEGATIVE
Anti-MDA-5 Ab (CADM-140)(RDL): <20 U (ref <20)
Anti-Mi-2 Ab (RDL): NEGATIVE
Anti-NXP-2 (P140) Ab (RDL): <20 U (ref <20)
Anti-OJ Ab (RDL): NEGATIVE
Anti-PL-12 Ab (RDL: NEGATIVE
Anti-PL-7 Ab (RDL): NEGATIVE
Anti-PM/Scl-100 Ab (RDL): <20 U (ref <20)
Anti-SAE1 Ab, IgG (RDL): <20 U (ref <20)
Anti-SRP Ab (RDL): NEGATIVE
Anti-SS-A 52kD Ab, IgG (RDL): 166 U — ABNORMAL HIGH (ref <20)
Anti-TIF-1gamma Ab (RDL): <20 U (ref <20)
Anti-U1 RNP Ab (RDL): <20 U (ref <20)
Anti-U2 RNP Ab (RDL): NEGATIVE
Anti-U3 RNP (Fibrillarin)(RDL): NEGATIVE

## 2024-06-17 NOTE — Progress Notes (Signed)
 She should not take the diflucan with her Xanax , Norco, or Fioricet 

## 2024-06-23 ENCOUNTER — Ambulatory Visit: Admitting: Pulmonary Disease

## 2024-06-28 ENCOUNTER — Ambulatory Visit (HOSPITAL_COMMUNITY)
Admission: RE | Admit: 2024-06-28 | Discharge: 2024-06-28 | Disposition: A | Discharge status: Home / Self Care | Source: Ambulatory Visit | Attending: Pulmonary Disease | Admitting: Pulmonary Disease

## 2024-06-28 DIAGNOSIS — R7989 Other specified abnormal findings of blood chemistry: Secondary | ICD-10-CM | POA: Diagnosis present

## 2024-06-28 MED ORDER — IOHEXOL 350 MG/ML SOLN
60.0000 mL | Freq: Once | INTRAVENOUS | Status: AC | PRN
  Administered 2024-06-28: 60 mL via INTRAVENOUS

## 2024-06-29 ENCOUNTER — Ambulatory Visit: Payer: Self-pay | Admitting: Pulmonary Disease

## 2024-06-29 DIAGNOSIS — J90 Pleural effusion, not elsewhere classified: Secondary | ICD-10-CM

## 2024-07-04 ENCOUNTER — Ambulatory Visit (HOSPITAL_COMMUNITY)
Admission: RE | Admit: 2024-07-04 | Discharge: 2024-07-04 | Disposition: A | Discharge status: Home / Self Care | Source: Ambulatory Visit | Attending: Radiology | Admitting: Radiology

## 2024-07-04 ENCOUNTER — Ambulatory Visit (HOSPITAL_COMMUNITY)
Admission: RE | Admit: 2024-07-04 | Discharge: 2024-07-04 | Disposition: A | Discharge status: Home / Self Care | Source: Ambulatory Visit | Attending: Pulmonary Disease

## 2024-07-04 DIAGNOSIS — J90 Pleural effusion, not elsewhere classified: Secondary | ICD-10-CM | POA: Insufficient documentation

## 2024-07-04 LAB — PROTEIN, PLEURAL OR PERITONEAL FLUID: Total protein, fluid: 3.5 g/dL

## 2024-07-04 LAB — GLUCOSE, PLEURAL OR PERITONEAL FLUID: Glucose, Fluid: 99 mg/dL

## 2024-07-04 LAB — LACTATE DEHYDROGENASE, PLEURAL OR PERITONEAL FLUID: LD, Fluid: 478 U/L — ABNORMAL HIGH (ref 3–23)

## 2024-07-04 MED ORDER — LIDOCAINE-EPINEPHRINE 1 %-1:100000 IJ SOLN
20.0000 mL | Freq: Once | INTRAMUSCULAR | Status: AC
  Administered 2024-07-04: 10 mL via INTRADERMAL

## 2024-07-04 MED ORDER — LIDOCAINE-EPINEPHRINE 1 %-1:100000 IJ SOLN
INTRAMUSCULAR | Status: AC
Start: 2024-07-04 — End: 2024-07-04
  Filled 2024-07-04: qty 1

## 2024-07-04 NOTE — Procedures (Signed)
 Ultrasound-guided diagnostic and therapeutic left sided thoracentesis performed yielding 500 milliliters of serosanguinous colored fluid. No immediate complications.   Diagnostic fluid was sent to the lab for further analysis. Follow-up chest x-ray pending. EBL is < 2 ml.

## 2024-07-05 ENCOUNTER — Other Ambulatory Visit: Payer: Self-pay | Admitting: Pulmonary Disease

## 2024-07-05 DIAGNOSIS — J90 Pleural effusion, not elsewhere classified: Secondary | ICD-10-CM

## 2024-07-06 ENCOUNTER — Ambulatory Visit: Admitting: Pulmonary Disease

## 2024-07-06 ENCOUNTER — Encounter: Payer: Self-pay | Admitting: Pulmonary Disease

## 2024-07-06 VITALS — BP 136/62 | HR 74 | Temp 97.9°F | Ht 63.0 in | Wt 143.0 lb

## 2024-07-06 DIAGNOSIS — C37 Malignant neoplasm of thymus: Secondary | ICD-10-CM

## 2024-07-06 DIAGNOSIS — J849 Interstitial pulmonary disease, unspecified: Secondary | ICD-10-CM

## 2024-07-06 DIAGNOSIS — J90 Pleural effusion, not elsewhere classified: Secondary | ICD-10-CM

## 2024-07-06 DIAGNOSIS — J984 Other disorders of lung: Secondary | ICD-10-CM | POA: Diagnosis not present

## 2024-07-06 DIAGNOSIS — R0602 Shortness of breath: Secondary | ICD-10-CM | POA: Diagnosis not present

## 2024-07-06 DIAGNOSIS — M3500 Sicca syndrome, unspecified: Secondary | ICD-10-CM

## 2024-07-06 NOTE — Patient Instructions (Addendum)
  VISIT SUMMARY: Today, we addressed your shortness of breath, recent pleural effusion drainage, and ongoing treatment for malignant thymoma. We also discussed your restrictive lung disease and potential Sjogren's syndrome.  YOUR PLAN: LEFT-SIDED PLEURAL EFFUSION, POST-DRAINAGE: You had 500 cc of bloody fluid drained from your left lung, which has improved your breathing. -We are waiting for the final results of the pleural fluid analysis. -Monitor for any recurrence of pleural effusion. -Report any increased shortness of breath so we can evaluate with a potential x-ray.  RESTRICTIVE LUNG DISEASE SECONDARY TO PRIOR THYMOMA TREATMENT AND PLEURAL EFFUSION: Your lung disease is likely due to scarring from previous treatments and the pleural effusion. -We will monitor for any fluid reaccumulation.  MULTIFACTORIAL SHORTNESS OF BREATH: Your shortness of breath is likely due to a combination of factors including restrictive lung disease, pleural effusion, and potential Sjogren's syndrome. -Use your albuterol  inhaler for relief during episodes of shortness of breath. -Continue using supplemental oxygen  therapy. -Use Boost oxygen  cans as needed for additional oxygen  support.  MALIGNANT THYMOMA, ON CHEMOTHERAPY: You are currently undergoing chemotherapy for malignant thymoma, and your recent PET scan appears stable. -Follow-up with your oncologist is scheduled for the upcoming Tuesday.  SUSPECTED SJOGREN'S SYNDROME WITH PULMONARY INVOLVEMENT: You have positive ANA and SSA antibodies, which suggest possible Sjogren's syndrome. This could explain your dry eyes, dry mouth, and potential lung involvement. -We will refer you to [facility] for further evaluation. -A high-resolution CT scan of your chest will be ordered to assess for interstitial lung disease.

## 2024-07-06 NOTE — Progress Notes (Signed)
 Autumn Conley    [id number]    08/09/1946  Primary Care Physician:Osborne, Tari DASEN, PA-C  Referring Physician: Tammy Tari DASEN, PA-C [address] [place],  [place] [address]  Chief complaint: Consult for dyspnea  HPI: 78 y.o. who  has a past medical history of Anemia, Anxiety, Breast cancer (HCC), Depression, Dyspnea, Fibromyalgia, GERD (gastroesophageal reflux disease), Glaucoma of both eyes, Headache, Hypertension, IBS (irritable bowel syndrome), Mitral valve prolapse, NASH (nonalcoholic steatohepatitis), Osteoarthritis, PONV (postoperative nausea and vomiting), Stroke (HCC), and 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(HCC).  Discussed the use of AI scribe software for clinical note transcription with the patient, who gave verbal consent to proceed.  History of Present Illness Autumn Conley is a 78 year old female with malignant thymoma who presents with progressive shortness of breath. She is accompanied by her husband, Dorris Pierre. She was referred by her oncologist for evaluation of her breathing difficulties.  Dyspnea and oxygen  requirement - Progressive shortness of breath over several years, significantly worsened with exertion such as walking or talking - Gasping for air with minimal exertion, including walking to the bathroom or table - Dyspnea improves when sitting still - On oxygen  therapy for approximately three weeks, requiring 6 liters to maintain adequate oxygen  saturation - Previous evaluations did not qualify her for oxygen  therapy until recently - No history of smoking or known environmental exposures (e.g., mold, feather pillows)  Malignant thymoma and oncologic history - Diagnosed with malignant thymoma, receiving chemotherapy for nearly five years - Underwent radiation therapy approximately 2-3 years ago - Has received three different types of chemotherapy and continues active treatment - History of pericardial window procedure in 2022 during hospitalization at [facility] for  pericardial effusion related to thymoma - History of right pleural effusion with chest tube drainage around the same time as pericardial window  Breast cancer history - History of breast cancer over 20 years ago - Treated with mastectomy and five-year course of medication - No history of radiation therapy for breast cancer  Respiratory and chest discomfort - Discomfort on the right side, attributed to nerve damage from previous surgeries  Sleep-disordered breathing - Uses CPAP machine at night - Significant improvement in sleep quality with CPAP  Cardiac history - History of tachycardia, previously managed with metoprolol  and amlodipine - Pericardial window procedure for pericardial effusion in 2022  Renal function abnormalities - History of elevated creatinine levels, occasionally resulting in delayed chemotherapy treatments  Autoimmune disease and immunosuppression - History of autoimmune disease of unknown type - Borderline positive ANA test in 2021 - On low-dose dexamethasone  (Decadron ) 1 mg daily, prescribed by oncologist  Interim history Autumn Conley is a 78 year old female with malignant thymoma who presents with shortness of breath and recent pleural effusion drainage.  Dyspnea -Positive D-dimer and underwent CTA which showed increasing left effusion, no PE.  She underwent thoracentesis on 9/15 with improvement in dyspnea - Approximately 500 cc of bloody fluid removed during drainage - Requires six liters of supplemental oxygen  - Uses albuterol  for symptomatic relief - Uses Boost oxygen  cans for additional oxygen  as needed - Inquires about using albuterol  during activities such as walking to the bathroom  Sicca symptoms - Dry mouth and dry eyes - Recent blood tests positive for ANA and SSA antibodies - Uses multiple medications for ocular symptoms  Relevant Pulmonary history: Pets: Cat Occupation: Used to work in [employer], [employer] Exposures: No mold, hot tub,  Financial controller.  No feather pillows or comforter  No h/o chemo/XRT/amiodarone/macrodantin /MTX  No exposure to asbestos, silica or other organic allergens  Smoking history: Never smoker Travel history: No significant travel history Family history: History of lung cancer in the family.   Outpatient Encounter Medications as of 07/06/2024  Medication Sig   albuterol  (VENTOLIN  HFA) 108 (90 Base) MCG/ACT inhaler Inhale 1-2 puffs into the lungs every 6 (six) hours as needed for wheezing or shortness of breath.   ALPRAZolam  (XANAX ) 0.25 MG tablet Take 0.5 tablets (0.125 mg total) by mouth daily as needed for anxiety.   amLODipine (NORVASC) 5 MG tablet Take 5 mg by mouth daily.   Artificial Tears ophthalmic solution Place 1 drop into both eyes 4 (four) times daily as needed (DRY EYES). EVOH TEARS   atorvastatin  (LIPITOR) 40 MG tablet TAKE 1 TABLET BY MOUTH AT  BEDTIME   B-D 3CC LUER-LOK SYR 25GX1 25G X 1 3 ML MISC USE TO INJECT  CYANOCOBALAMIN  ONCE MONTHLY   butalbital -acetaminophen -caffeine  (FIORICET ) 50-325-40 MG tablet TAKE 1 TO 2 TABLETS BY MOUTH EVERY 6 HOURS AS NEEDED FOR HEADACHES(S). MANUFACTURER RECOMMENDS NOT EXCEEDING 6 TABLETS PER DAY.   Cholecalciferol  (VITAMIN D -3 PO) Take 1 capsule by mouth daily.   dexamethasone  (DECADRON ) 1 MG tablet Take 1 mg by mouth daily.   docusate sodium  (COLACE) 100 MG capsule Take 100 mg by mouth 2 (two) times daily as needed for mild constipation or moderate constipation.   folic acid  (FOLVITE ) 1 MG tablet Take 1 mg by mouth daily.   HYDROcodone -acetaminophen  (NORCO/VICODIN) 5-325 MG tablet Take 1 tablet by mouth.   lidocaine -prilocaine (EMLA) cream Apply 1 application  topically as needed.   metoprolol  succinate (TOPROL  XL) 25 MG 24 hr tablet Take 1/2 tablet (12.5mg ) daily for 1 week. Take 1 tablet (25mg ) daily after.   moxifloxacin (VIGAMOX) 0.5 % ophthalmic solution Place 1 drop into the right eye 2 (two) times daily.   mupirocin  ointment (BACTROBAN ) 2 % Apply  to nasal area 1-2 times daily (Patient taking differently: Apply 1 Application topically daily as needed (infection).)   PARoxetine  (PAXIL ) 20 MG tablet TAKE 3 TABLETS BY MOUTH IN THE  MORNING   prednisoLONE  acetate (PRED FORTE ) 1 % ophthalmic suspension Place 1 drop into both eyes daily.   valACYclovir  (VALTREX ) 500 MG tablet TAKE 1 TABLET BY MOUTH DAILY   vitamin E  180 MG (400 UNITS) capsule Take 400 Units by mouth daily.   furosemide  (LASIX ) 20 MG tablet Take 1 tablet (20 mg total) by mouth daily.   [Paused] irbesartan  (AVAPRO ) 150 MG tablet TAKE 1 TABLET BY MOUTH DAILY (Patient not taking: Reported on 07/06/2024)   loratadine (CLARITIN) 10 MG tablet Take 10 mg by mouth daily. (Patient not taking: Reported on 07/06/2024)   pantoprazole  (PROTONIX ) 40 MG tablet Take 1 tablet (40 mg total) by mouth daily before breakfast. (Patient not taking: Reported on 07/06/2024)   polyethylene glycol powder (GLYCOLAX /MIRALAX ) 17 GM/SCOOP powder Take 17 g by mouth daily as needed for mild constipation. (Patient not taking: Reported on 07/06/2024)   potassium chloride  (KLOR-CON ) 10 MEQ tablet Take 1 tablet (10 mEq total) by mouth daily.   No facility-administered encounter medications on file as of 07/06/2024.     Physical Exam: Today's Vitals   07/06/24 1512  TempSrc: Oral  Weight: 143 lb (64.9 kg)  Height: 5' 3 (1.6 m)   Body mass index is 25.33 kg/m.  Physical Exam GEN: No acute distress. CV: Regular rate and rhythm, no murmurs. LUNGS: Clear to auscultation bilaterally, normal respiratory effort.  SKIN JOINTS: Warm and dry, no rash.  Data Reviewed: Imaging: CTA 10/17/2023-no evidence of pulmonary embolism, extensive pleural thickening, small bilateral loculated effusion, soft tissue fullness in the right hemithorax, chronic architectural distortion, masslike opacities at the lower lobes  CT chest abdomen pelvis report from [facility] 04/26/2024 1.  Increased size of the small loculated left  pleural effusion. No new pleural nodularity, however, evaluation is limited in absence of IV contrast.  2.  Stable right breast masses and post radiation fibrosis in the right middle lobe.  3.  Previously described nodule in the ascending colon is not seen on this noncontrast study. Again, recommend colonoscopy when clinically feasible.  4.  Unchanged size of the indeterminate right interpolar renal lesion.   PET scan from [facility] 06/07/2024 1.  Decreased soft tissue thickening and decreased avidity (though still measuring slightly above liver) along the right breast/chest wall, favored posttreatment related changes. However attention follow-up is advised.  2.  No new FDG avid lymphadenopathy or distant metastatic disease.  3.  Similar diffuse low-level FDG uptake associated with the enlarged moderate left pleural effusion.   CTA 06/28/2024-no pulmonary embolism, chronic right chest wall tissue density extending into the middle lobe, right mediastinum, chronic right effusion, progression of left pleural effusion.  Generalized pleural thickening I have reviewed the images personally  PFTs: 04/06/2024 FVC 1.36 [52%], FEV1 1.15 [60%], F/F85, DLCO 6.02 [32%] Moderate restriction, severe diffusion defect  Labs: CTD serology 05/01/2020-ANA 1: 80, nuclear speckled CTD serologies 06/01/2014-ANA 1: 320, cytoplasmic, rheumatoid factor-16, SSA greater than 8  Cardiac: Echocardiogram 12/17/2023 LVEF 60 to 65%, mild LVH with grade 1 diastolic dysfunction.  RV systolic function is normal, SSA greater than 8 Assessment & Plan Left-sided pleural effusion, post-drainage Left-sided pleural effusion was drained on 07/04/2024, yielding 500 cc of bloody fluid. Initial results show inflammation, but final results are pending. Cultures are negative, reducing the likelihood of infection. The effusion was moderate in size and its drainage has improved breathing significantly. - Await final results of pleural fluid  analysis, follow cytology - Monitor for recurrence of pleural effusion - Advise to report increased shortness of breath for potential x-ray evaluation  Restrictive lung disease secondary to prior thymoma treatment and pleural effusion Restrictive lung disease likely due to scarring from prior thymoma treatment (radiation and surgery) and pleural effusion. The fluid collection compresses the lungs, limiting expansion. Partial improvement noted post-drainage. - Monitor for fluid reaccumulation  Multifactorial shortness of breath Shortness of breath is likely multifactorial, involving restrictive lung disease, pleural effusion, and potential Sjogren's syndrome. No evidence of pulmonary embolism on recent CT scan. Symptomatic relief with albuterol  inhaler and supplemental oxygen  is advised. - Advise use of albuterol  inhaler for symptomatic relief during episodes of shortness of breath - Continue supplemental oxygen  therapy - Use Boost oxygen  cans as needed for additional oxygen  support  Malignant thymoma, on chemotherapy Currently undergoing chemotherapy for malignant thymoma. Recent PET scan reviewed and appears stable. - Follow-up with oncologist scheduled for the upcoming Tuesday  Suspected Sjogren's syndrome with pulmonary involvement Positive ANA and SSA antibodies suggest possible Sjogren's syndrome, which could explain dry eyes, dry mouth, and potential lung involvement. Sjogren's syndrome can cause scarring in the lungs, and a high-resolution CT scan is planned to assess for interstitial lung disease. - Refer to [facility] for evaluation of suspected Sjogren's syndrome - Order high-resolution CT scan of the chest to assess for interstitial lung disease  Recommendations: Follow-up pleural fluid studies Referral to rheumatology High-resolution CT  scan  Lonna Coder MD [facility] 07/06/2024, 3:16 PM  CC: Tammy Tari DASEN, PA-C

## 2024-07-07 LAB — BODY FLUID CULTURE W GRAM STAIN: Culture: NO GROWTH

## 2024-07-25 ENCOUNTER — Ambulatory Visit (HOSPITAL_COMMUNITY)
Admission: RE | Admit: 2024-07-25 | Discharge: 2024-07-25 | Disposition: A | Discharge status: Home / Self Care | Source: Ambulatory Visit | Attending: Internal Medicine | Admitting: Internal Medicine

## 2024-07-25 DIAGNOSIS — I350 Nonrheumatic aortic (valve) stenosis: Secondary | ICD-10-CM | POA: Insufficient documentation

## 2024-07-25 LAB — ECHOCARDIOGRAM COMPLETE
AR max vel: 0.96 cm2
AV Area VTI: 0.89 cm2
AV Area mean vel: 0.88 cm2
AV Mean grad: 38 mmHg
AV Peak grad: 52.2 mmHg
Ao pk vel: 3.61 m/s
Area-P 1/2: 4.06 cm2
P 1/2 time: 382 ms
S' Lateral: 2.5 cm

## 2024-07-29 NOTE — Progress Notes (Signed)
 Patient tolerated treatment well and vitals were stable. Patient was alert, oriented, and ambulatory at discharge [place] Casilda Harding, RN

## 2024-08-11 ENCOUNTER — Encounter: Payer: Self-pay | Admitting: *Deleted

## 2024-08-15 ENCOUNTER — Ambulatory Visit: Attending: Internal Medicine | Admitting: Internal Medicine

## 2024-08-15 ENCOUNTER — Encounter: Payer: Self-pay | Admitting: Internal Medicine

## 2024-08-15 VITALS — BP 126/70 | HR 76 | Ht 63.0 in | Wt 136.5 lb

## 2024-08-15 DIAGNOSIS — I5033 Acute on chronic diastolic (congestive) heart failure: Secondary | ICD-10-CM

## 2024-08-15 DIAGNOSIS — R002 Palpitations: Secondary | ICD-10-CM

## 2024-08-15 DIAGNOSIS — I471 Supraventricular tachycardia, unspecified: Secondary | ICD-10-CM

## 2024-08-15 DIAGNOSIS — I5032 Chronic diastolic (congestive) heart failure: Secondary | ICD-10-CM

## 2024-08-15 DIAGNOSIS — I350 Nonrheumatic aortic (valve) stenosis: Secondary | ICD-10-CM | POA: Diagnosis not present

## 2024-08-15 DIAGNOSIS — R0602 Shortness of breath: Secondary | ICD-10-CM

## 2024-08-15 NOTE — Progress Notes (Signed)
 Cardiology Office Note:  .   Date:  08/15/2024  ID:  Autumn Conley, DOB 10/11/1946, MRN [id number] PCP: Tammy Tari ONEIDA DEVONNA  [facility] Providers Cardiologist:  Soyla DELENA Merck, MD Electrophysiologist:  Danelle Birmingham, MD    History of Present Illness: .   Autumn Conley is a 78 y.o. female.  Discussed the use of AI scribe software for clinical note transcription with the patient, who gave verbal consent to proceed.  History of Present Illness Autumn Conley is a 78 year old female with aortic valve stenosis and lung disease who presents with worsening shortness of breath and anemia.  Over the past six months to a year, Autumn Conley has experienced worsening shortness of breath, particularly with activity. Her breathing is severely impaired during exertion. She has been on oxygen  therapy, and her stamina has decreased significantly since her last visit in April.  She has aortic valve stenosis that has been monitored over the years. Her medication regimen includes metoprolol  25 mg daily for rhythm control and amlodipine 5 mg daily for blood pressure management. She previously underwent ablation for supraventricular tachycardia in October 2024.  She has significant anemia, with a hemoglobin level of 6.9, persistent over the last few months. She received two units of blood and iron supplementation about a month ago but has not noticed an improvement in her energy levels. Her anemia has been a barrier to receiving chemotherapy consistently, as her hemoglobin levels have been too low at times.  She is currently undergoing chemotherapy every six weeks, administered through a port. Her treatment schedule has been adjusted due to swelling in her legs and low hemoglobin levels. Her daughter mentions that she has been experiencing swelling in her legs, which has impacted her chemotherapy schedule.    ROS: negative except per HPI above.  Studies Reviewed: SABRA   EKG  Interpretation Date/Time:  Monday August 15 2024 09:17:33 EDT Ventricular Rate:  73 PR Interval:  134 QRS Duration:  80 QT Interval:  416 QTC Calculation: 458 R Axis:   75  Text Interpretation: Sinus rhythm with PAC Cannot rule out Anterior infarct , age undetermined When compared with ECG of 16-Feb-2024 10:01, Vent. rate has decreased BY  42 BPM Confirmed by Ginny Loomer ([id number]) on 08/15/2024 9:29:40 AM    Results LABS Hemoglobin: 6.9 g/dL  DIAGNOSTIC Echocardiogram: Severe aortic valve stenosis Risk Assessment/Calculations:       Physical Exam:   VS:  BP 126/70   Pulse 76   Ht 5' 3 (1.6 m)   Wt 136 lb 8 oz (61.9 kg)   SpO2 91%   BMI 24.18 kg/m    Wt Readings from Last 3 Encounters:  08/15/24 136 lb 8 oz (61.9 kg)  07/06/24 143 lb (64.9 kg)  06/01/24 143 lb (64.9 kg)     Physical Exam GENERAL: Alert, cooperative, well developed, no acute distress. HEENT: Normocephalic, normal oropharynx, moist mucous membranes. CHEST: Clear to auscultation bilaterally, no wheezes, rhonchi, or crackles. CARDIOVASCULAR: Aortic valve severely tight, normal heart rate and rhythm, S1 and S2 normal without murmurs. ABDOMEN: Soft, non-tender, non-distended, without organomegaly, normal bowel sounds. EXTREMITIES: No cyanosis or edema. NEUROLOGICAL: Cranial nerves grossly intact, moves all extremities without gross motor or sensory deficit.   ASSESSMENT AND PLAN: .    Assessment and Plan Assessment & Plan Severe aortic valve stenosis Severe stenosis with calcification causing symptomatic tightness and exertional dyspnea.  - Discuss with valve team during weekly meetings for procedure timing and appropriateness given comorbidities. -  Monitor symptoms and reassess in three months. We are somewhat limited in proceeding due to comorbidities, hard to definitively say her symptoms have worsened due to AS.   Anemia due to chronic disease and chemotherapy Anemia likely multifactorial from  chronic disease and chemotherapy. Hemoglobin low at 6.9, worsening fatigue and dyspnea. - Coordinate with Doctor Petty's team to manage anemia and improve blood counts. - Recheck blood counts on November 11th to assess response to transfusions and iron therapy with [facility] team.   Chronic diastolic heart failure Exacerbated by severe aortic stenosis and anemia, increasing cardiac stress and contributing to fatigue and dyspnea.  Recording duration: 36 minutes      Dequante Tremaine, MD, FACC

## 2024-08-15 NOTE — Patient Instructions (Signed)
 Medication Instructions:  No Changes *If you need a refill on your cardiac medications before your next appointment, please call your pharmacy*  Lab Work: None  Follow-Up: At [facility], you and your health needs are our priority.  As part of our continuing mission to provide you with exceptional heart care, our providers are all part of one team.  This team includes your primary Cardiologist (physician) and Advanced Practice Providers or APPs (Physician Assistants and Nurse Practitioners) who all work together to provide you with the care you need, when you need it.  Your next appointment:   11/18/2024 at 11:00 am   Provider:   Gayatri A Acharya, MD   Other Instructions Please call us  or send a MyChart message with any Cardiology related questions/concerns.  [phone].  Thank you!

## 2024-08-30 NOTE — Progress Notes (Addendum)
 Patient discharged alert via wheelchair. Patient to get CXR prior to leaving. Tolerated treatment without complaints. Electronically signed by: Samule JONETTA Bloch, RN 08/30/2024 1:32 PM

## 2024-08-30 NOTE — Progress Notes (Signed)
 Patient ID: Autumn Conley is a 78 y.o. female. Referring Physician: Elsie Reyes Jerry, MD [address] [place],  [place] [address] Primary Care Provider: Tari ONEIDA Banter, PA-C  Oncology History Overview Note  Diagnosis: Stage IIIA T3N0M0 thymoma, [place] Stage IIIA, WHO type B2  Surgeon: Dr. Jannell Medical Oncologist: Dr. Jerry Radiation Oncologist: Dr. Winnifred     Breast cancer [facility]) (Resolved)  02/27/2003 Initial Diagnosis   Left breast cancer (HCC)   03/23/2003 Surgery   S/p left mastectomy   03/27/2003 - 03/22/2008 Hormone Therapy   5 years of Hormonal Therapy: initially with Tamoxifen but switched to Anastrozole   Thymoma  01/31/2019 Biopsy   Right pleural soft tissue: Necrotic tissue consistent with tumor necrosis.  Comment: Overall findings may represent a T-cell lymphoblastic lymphoma, thymic epithelial neoplasm cannot be entirely ruled out   02/14/2019 Biopsy   Mediastinal mass:  [facility] pathology: T-cell lymphoblastic lymphoma [facility] pathology: Malignant neoplasm with extensive sclerosis, necrosis and crush artifact   03/18/2019 Biopsy   BMBx: Normocellular bone marrow, no leukemia or lymphoma identified   03/23/2019 Biopsy   Mediastinal mass: thymoma, WHO type B2   04/11/2019 - 08/14/2019 Chemotherapy   06/22 - 08/24: Completed 4 cycles carboplatin  etoposide   Planned for 2 additional cycles  OP ONC Lung CARBOplatin  / etoposide  Plan Provider: Elsie Reyes Jerry, MD Treatment goal: Control Line of treatment: [No plan line of treatment]   12/11/2019 - 01/20/2020 Radiation   Anterior mediastinal mass and pleural implants to 60 Gy in 30 fx   01/01/2021 - 01/08/2021 Chemotherapy   OP ONC SUPPORTIVE FERRIC CARBOXYMALTOSE GLEN) Plan Provider: Elsie Reyes Jerry, MD Treatment goal: Supportive Line of treatment: [No plan line of treatment]   07/30/2021 -  Chemotherapy   OP ONC Lung PEMEtrexed  Plan Provider: Elsie Reyes Jerry,  MD Treatment goal: Control Line of treatment: [No plan line of treatment]   01/27/2023 -  Oncology Treatment   Protocol Pemetrexed   Chemotherapy/Immunotherapy Medications PEMEtrexed  disodium (ALIMTA) 920 mg in sodium chloride  0.9 % 100 mL chemo IVPB, 500 mg/m2 = 920 mg, intravenous Administration: 920 mg (01/27/2023), 920 mg (03/10/2023), 920 mg (04/21/2023), 920 mg (06/02/2023), 920 mg (08/11/2023), 920 mg (09/22/2023), 920 mg (11/03/2023), 920 mg (12/15/2023), 920 mg (02/09/2024), 865 mg (04/26/2024), 865 mg (06/07/2024), 865 mg (07/19/2024)  [Plan is still active]   08/18/2023 - 08/18/2023 Supportive Treatment   Adult OP Blood Administration PRN x 30 DAYS Plan Provider: Elsie Reyes Jerry, MD   07/19/2024 -  Supportive Treatment   Adult OP Blood Administration PRN x 30 DAYS Plan Provider: Elsie Reyes Jerry, MD   Malignant neoplasm of thymus    (CMD)  11/25/2022 Initial Diagnosis   Malignant neoplasm of thymus (CMS/HCC)   01/27/2023 -  Oncology Treatment   Protocol Pemetrexed   Chemotherapy/Immunotherapy Medications PEMEtrexed  disodium (ALIMTA) 920 mg in sodium chloride  0.9 % 100 mL chemo IVPB, 500 mg/m2 = 920 mg, intravenous Administration: 920 mg (01/27/2023), 920 mg (03/10/2023), 920 mg (04/21/2023), 920 mg (06/02/2023), 920 mg (08/11/2023), 920 mg (09/22/2023), 920 mg (11/03/2023), 920 mg (12/15/2023), 920 mg (02/09/2024), 865 mg (04/26/2024), 865 mg (06/07/2024), 865 mg (07/19/2024)  [Plan is still active]   01/27/2023 -  Supportive Treatment   Adult Peripheral IV and Central Venous Access Orders Plan Provider: Elsie Reyes Jerry, MD   02/02/2024 -  Supportive Treatment   Supportive Hydration Hem/Onc Plan Provider: Elsie Reyes Jerry, MD Treatment goal: Supportive Line of treatment: [No plan line of treatment]   07/19/2024 -  Supportive Treatment   Adult OP Blood Administration PRN x 30 DAYS Plan Provider: Elsie Reyes Jerry, MD     Subjective  History of Present Illness Current  Status: I had the pleasure of seeing Autumn Conley in clinic today for continued follow up. She is accompanied by her husband today.  She continues to experience breathing difficulties and is scheduled to see a pulmonologist on Thursday. She is currently using 5 liters of oxygen , which maintains her saturation at 100 percent, although she is supposed to be on 6 liters constantly. Despite this, she has difficulty walking short distances without additional support. She reports an inability to achieve full inspiration and is considering adjusting her CPAP settings. Her lung sounds have been reported as normal. She has previously undergone a port replacement but continues to experience issues with blood withdrawal.  Her fatigue levels remain consistent, with a slight improvement noted. She has received both blood transfusions and iron infusions in the past. Her hemoglobin has been around 8 recently, which is an improvement.  She reports no edema, nausea, vomiting, fevers, or chills, except those induced by cold weather. She has been experiencing a cough for the past week, which has slightly worsened.  She has not collected a urine sample today but reports the presence of foamy urine, although less severe than before. She is not under the care of a nephrologist.  She has been evaluated by a cardiologist and deemed a minimal candidate for aortic valve replacement, which has been postponed due to her low hemoglobin levels. Her hemoglobin has remained below 10 for several years.  She also reports a decline in her memory function.  Review of Systems  Constitutional:  Positive for fatigue. Negative for chills, diaphoresis and fever.  HENT:  Negative.    Eyes: Negative.   Respiratory:  Positive for shortness of breath (stable). Negative for cough and wheezing.   Cardiovascular:  Positive for leg swelling. Negative for chest pain and palpitations.  Gastrointestinal: Negative.   Genitourinary: Negative.     Musculoskeletal:        Right breast tenderness  Skin: Negative.   Neurological:  Negative for dizziness, extremity weakness and headaches.  Psychiatric/Behavioral:  The patient is nervous/anxious.      Objective  BSA: 1.69 meters squared BP 148/66 (BP Location: Right arm, Patient Position: Sitting)   Pulse 75   Temp 97.4 F (36.3 C) (Temporal)   Resp 14   Ht 1.6 m (5' 3)   Wt 64.5 kg (142 lb 3.2 oz)   SpO2 100%   BMI 25.19 kg/m    Physical Exam Constitutional:      Appearance: Normal appearance. She is not ill-appearing.  HENT:     Head: Normocephalic and atraumatic.  Eyes:     General: No scleral icterus.    Extraocular Movements: Extraocular movements intact.     Conjunctiva/sclera: Conjunctivae normal.  Cardiovascular:     Rate and Rhythm: Normal rate and regular rhythm.     Heart sounds: Murmur heard.  Pulmonary:     Effort: Pulmonary effort is normal. No respiratory distress.     Breath sounds: Normal breath sounds. No wheezing.     Comments: Absent breath sounds in LLL. Abdominal:     General: Abdomen is flat. Bowel sounds are normal. There is no distension.     Palpations: Abdomen is soft.     Tenderness: There is no abdominal tenderness.  Musculoskeletal:     Cervical back: Normal range of motion and  neck supple.     Right lower leg: Edema (+1) present.     Left lower leg: Edema (+1) present.  Skin:    General: Skin is warm and dry.     Findings: No rash.  Neurological:     Mental Status: She is alert.    Performance Status: 2 - Ambulatory and capable of all selfcare but unable to carry out any work activities.  Up and about more than 50% of waking hours   Lab Results  Component Value Date   WBC 7.30 08/30/2024   HGB 8.0 (L) 08/30/2024   HCT 23.4 (L) 08/30/2024   PLT 184 08/30/2024   CREATININE 1.20 08/30/2024   AST 18 08/30/2024   Imaging: No new imaging to review today.   PET/CT SKULL BASE TO MID THIGH, 06/07/2024 11:58 AM   CONCLUSION: 1.  Decreased soft tissue thickening and decreased avidity (though still measuring slightly above liver) along the right breast/chest wall, favored posttreatment related changes. However attention follow-up is advised. 2.  No new FDG avid lymphadenopathy or distant metastatic disease. 3.  Similar diffuse low-level FDG uptake associated with the enlarged moderate left pleural effusion.   CT ANGIOGRAPHY CHEST WITH CONTRAST 06/28/2024 09:23  IMPRESSION:  1. Negative for acute pulmonary embolus.   2. Chronic abnormal right chest wall, with masslike soft tissue  contiguous into the right middle lobe and the right mediastinum, and  associated right anterolateral rib pathologic fractures. But these  findings are stable since 10/17/2023 and compatible with treated  malignancy there. A small chronic loculated right lung base pleural  effusion is also unchanged.   3. But progression of loculated left pleural effusion since last  year, now moderate to large. And increased left lung compressive  atelectasis. Generalized pleural thickening associated with the  collection is nonspecific, and left pleural metastatic disease would  be difficult to exclude. Correlation with thoracentesis and fluid  analysis may be most valuable.   4. No other progressive finding identified in the chest when  compared to 10/17/2023. Coronary artery and Aortic Atherosclerosis  (ICD10-I70.0).   Assessment/Plan  Cancer Staging <redacted file path>  Thymoma Staging form: Thymus, AJCC 8th Edition - Clinical: Stage IIIA (cT3, cN0, cM0) - Unsigned  Metastatic thymoma - Continues to tolerate pemetrexed  well. We will proceed with q 6 weeks pemetrexed . Her creatinine clearance has been too low at times but stable today. Labs reviewed and appropriate for treatment today. I discussed the risks and benefits of continuing treatment with the patient. The patient was given the opportunity to ask questions and wishes to  proceed.  We will plan to see her back in 6 weeks for labs, visit and treatment.   Left Pleural Effusion - Thoracentesis on 9/15 at [facility] - Pleural fluid with rare WBC present, predominately PMN, no organisms seen - No mention of malignant cells and mild FDG avidity on her recent PET scan -- I am not concerned this is related to her cancer - Absent breath sounds in LLL today, will order a CXR for today to evaluate if repeat thoracentesis is needed.  Iron Deficiency Hx of Macrocytic Anemia - She had tried oral iron in the past which did not help her iron deficiency. Patient states approximately a year ago she underwent a colonoscopy which was normal. - She has tolerated IV iron in the past, most recently 07/29/24 and has noticed slight improvement in fatigue.  - Hgb today is 8 without any signs of blood loss.  -  Will place order today to complete 2 more doses of injectafer this month.   Access - She has had multiple issues with port-a-cath access requiring cathflo with each visit. - Order placed today for IR port evaluation.   Aortic valave stenosis- She follows with cardiology and EP who is recommending a possible aortic valve replacement, however her Hgb must improve in order for them to proceed with surgery.     Treatment Details  Treatment goal Control  Plan Name Pemetrexed   Status Active  Start Date 01/27/2023  End Date 01/03/2025 (Planned)  Provider Elsie Reyes Jerry, MD  Chemotherapy PEMEtrexed  disodium (ALIMTA) 920 mg in sodium chloride  0.9 % 100 mL chemo IVPB, 500 mg/m2 = 920 mg, intravenous Administration: 920 mg (01/27/2023), 920 mg (03/10/2023), 920 mg (04/21/2023), 920 mg (06/02/2023), 920 mg (08/11/2023), 920 mg (09/22/2023), 920 mg (11/03/2023), 920 mg (12/15/2023), 920 mg (02/09/2024), 865 mg (04/26/2024), 865 mg (06/07/2024), 865 mg (07/19/2024)    Therapy Plan Details  Plan Name Adult Peripheral IV and Central Venous Access Orders  Status Active  Start Date 01/27/2023   Provider Elsie Reyes Jerry, MD   Therapy Plan Details  Plan Name Adult OP Blood Administration PRN x 30 DAYS  Status Active  Start Date 07/19/2024  Provider Elsie Reyes Jerry, MD   I spent 40 minutes for this encounter and more than 50% of this time was spent on face-to-face counseling.  Isaiah Murray Schleutker, PA-C

## 2024-08-31 NOTE — Nursing Note (Addendum)
 Chart Review Situation Order entered for--IR PORT Evaluation Ordered  by  Isaiah Shawnee Hacker, PA-C   Background 78 year old female with breast cancer,  thymoma and aortic valve stenosis and lung disease with worsening shortness of breath and anemia. On 5L O2NC.  Port flushes, but not able to draw back  Per order, chemotherapy, patient has required cathflo 3 times  Chart review show multiple cathflo administrations this year.  The last being yesterday. Chest xray done yesterday  09/19/2022-Successful fluoroscopically-guided exchange of the existing right internal jugular catheter for a new port catheter.  Assessment Allergies Latex, Med list reviewed, Thinners none, Labs 08/30/24-HGB 8.0 has been less than 10 for years  Latest Reference Range & Units 08/30/24 10:19  Sodium 136 - 145 mmol/L 136  Potassium 3.4 - 4.5 mmol/L 3.5  Chloride 98 - 107 mmol/L 99  CO2 21 - 31 mmol/L 31  Anion Gap 6 - 14 mmol/L 6  Glucose 70 - 99 mg/dL 893 (H)  Blood Urea Nitrogen 7 - 25 mg/dL 25  Creatinine 9.39 - 8.79 mg/dL 8.79  (H): Data is abnormally high  Latest Reference Range & Units 08/30/24 10:19  WBC 4.40 - 11.00 10*3/uL 7.30  RBC 4.10 - 5.10 10*6/uL 2.40 (L)  Hemoglobin 12.3 - 15.3 g/dL 8.0 (L)  Hematocrit 64.0 - 44.6 % 23.4 (L)  Mean Corpuscular Volume (MCV) 80.0 - 96.0 fL 97.5 (H)  MCH 27.5 - 33.2 pg 33.2  Mean Corpuscular Hemoglobin Conc (MCHC) 33.0 - 37.0 g/dL 65.9  Red Cell Distribution Width (RDW-CV) 12.3 - 17.0 % 24.5 (H)  Platelet Count (Plt) 150 - 450 10*3/uL 184  (L): Data is abnormally low (H): Data is abnormally high   Recommendation Per Donald Helper, PA-C, She'll need to be scheduled for fibrin sheath eval, possible replacement (if she still needs the port). This is what Wendy's note said on that day it was exchanged in 2023.New IJ access obtained and extensive fibrin sheath evident under fluoro. New DL PAC placed with tip left at the cavo-atrial junction slightly lower than  original. Schedule in a fluoro room, not room 104 with sedation

## 2024-09-01 ENCOUNTER — Encounter: Payer: Self-pay | Admitting: Pulmonary Disease

## 2024-09-01 ENCOUNTER — Ambulatory Visit (INDEPENDENT_AMBULATORY_CARE_PROVIDER_SITE_OTHER): Admitting: Pulmonary Disease

## 2024-09-01 VITALS — BP 110/70 | HR 74 | Temp 98.1°F | Ht 63.0 in | Wt 143.0 lb

## 2024-09-01 DIAGNOSIS — J3489 Other specified disorders of nose and nasal sinuses: Secondary | ICD-10-CM

## 2024-09-01 DIAGNOSIS — R040 Epistaxis: Secondary | ICD-10-CM

## 2024-09-01 DIAGNOSIS — R0600 Dyspnea, unspecified: Secondary | ICD-10-CM

## 2024-09-01 DIAGNOSIS — J9611 Chronic respiratory failure with hypoxia: Secondary | ICD-10-CM

## 2024-09-01 DIAGNOSIS — C37 Malignant neoplasm of thymus: Secondary | ICD-10-CM | POA: Diagnosis not present

## 2024-09-01 DIAGNOSIS — J90 Pleural effusion, not elsewhere classified: Secondary | ICD-10-CM

## 2024-09-01 DIAGNOSIS — J984 Other disorders of lung: Secondary | ICD-10-CM

## 2024-09-01 DIAGNOSIS — G4733 Obstructive sleep apnea (adult) (pediatric): Secondary | ICD-10-CM

## 2024-09-01 DIAGNOSIS — J849 Interstitial pulmonary disease, unspecified: Secondary | ICD-10-CM

## 2024-09-01 DIAGNOSIS — R519 Headache, unspecified: Secondary | ICD-10-CM

## 2024-09-01 NOTE — Progress Notes (Signed)
 Autumn Conley    [id number]    10/27/1945  Primary Care Physician:Osborne, Tari DASEN, PA-C  Referring Physician: Tammy Tari DASEN, PA-C [address] [place],  [place] [address]  Chief complaint:  Follow-up for  Dyspnea, chronic respiratory failure Restrictive lung disease Recurrent left pleural effusion Postradiation fibrosis SARD ILD OSA  HPI: 78 y.o. who  has a past medical history of Anemia, Anxiety, Breast cancer (HCC), Depression, Dyspnea, Fibromyalgia, GERD (gastroesophageal reflux disease), Glaucoma of both eyes, Headache, Hypertension, IBS (irritable bowel syndrome), Mitral valve prolapse, NASH (nonalcoholic steatohepatitis), Osteoarthritis, PONV (postoperative nausea and vomiting), Stroke (HCC), and 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(HCC).  Discussed the use of AI scribe software for clinical note transcription with the patient, who gave verbal consent to proceed.  History of Present Illness Autumn Conley is a 78 year old female with malignant thymoma who presents with progressive shortness of breath, pleural effusion. She is accompanied by her husband, Autumn Conley.   Pleural effusion, dyspnea, restrictive lung disease - Felt to have restrictive lung disease due to combination of postradiation fibrosis, malignant thymoma affecting chest wall cavity, pleural effusion and likely SARD-ILD - Positive D-dimer and underwent CTA September 2025 which showed increasing left effusion, no PE.  She underwent thoracentesis on 07/04/2024 with improvement in dyspnea - Approximately 500 cc of bloody fluid removed during drainage.  Autoimmune disease - History of autoimmune disease with positive ANA, SSA, rheumatoid factor - Has sicca symptoms with dry mouth, dry - Referred to rheumatologist in September 2025 but has not been completed yet  Malignant thymoma and oncologic history - Diagnosed with malignant thymoma, receiving chemotherapy for nearly five years - Underwent radiation therapy  approximately around 2022 - Has received three different types of chemotherapy and continues active treatment at [facility] - History of pericardial window procedure in 2022 during hospitalization at [facility] for pericardial effusion related to thymoma - History of right pleural effusion with chest tube drainage around the same time as pericardial window  Breast cancer history - History of breast cancer over 20 years ago - Treated with mastectomy and five-year course of oral medication - No history of radiation therapy for breast cancer  Sleep-disordered breathing - Uses CPAP machine at night - Significant improvement in sleep quality with CPAP  Cardiac history - History of tachycardia, previously managed with metoprolol  and amlodipine - Pericardial window procedure for pericardial effusion in 2022  CKD, Renal function abnormalities - History of elevated creatinine levels, occasionally resulting in delayed chemotherapy treatments  Interim history Autumn Conley is a 78 year old female with thymoma and restrictive lung disease who presents with worsening shortness of breath.  Dyspnea and pleural effusion - Progressive worsening of shortness of breath - Recent chest x-ray demonstrated left-sided pleural effusion - Underwent thoracentesis on September 15 with removal of 500 cc of fluid, resulting in temporary symptomatic improvement  Thymoma and oncologic therapy - Receiving chemotherapy every six weeks at [facility] for thymoma - Chemotherapy interval previously adjusted from every three weeks to every six weeks due to leg inflammation - History of radiation therapy  Restrictive lung disease and oxygen  therapy - Uses supplemental oxygen  regularly to maintain oxygen  saturation between 90% and 95% - Uses CPAP at night but experiences sensation of inadequate airflow, leading to removal of the device during sleep - Considering adjustment of CPAP pressure settings  Respiratory  medications and adjuncts - Uses albuterol  infrequently due to limited symptom relief - Uses Boost sports  oxygen  for shortness of breath during activities  Associated symptoms and oxygen -related side effects - Morning headaches, possibly related to oxygen  use - Nasal dryness and epistaxis, potentially associated with oxygen  therapy  Relevant Pulmonary history: Pets: Cat Occupation: Used to work in presenter, broadcasting Exposures: No mold, hot tub, Financial Controller.  No feather pillows or comforter No h/o chemo/XRT/amiodarone/macrodantin /MTX  No exposure to asbestos, silica or other organic allergens  Smoking history: Never smoker Travel history: No significant travel history Family history: History of lung cancer in the family.  Outpatient Encounter Medications as of 09/01/2024  Medication Sig   albuterol  (VENTOLIN  HFA) 108 (90 Base) MCG/ACT inhaler Inhale 1-2 puffs into the lungs every 6 (six) hours as needed for wheezing or shortness of breath.   ALPRAZolam  (XANAX ) 0.25 MG tablet Take 0.5 tablets (0.125 mg total) by mouth daily as needed for anxiety.   amLODipine (NORVASC) 5 MG tablet Take 5 mg by mouth daily.   Artificial Tears ophthalmic solution Place 1 drop into both eyes 4 (four) times daily as needed (DRY EYES). EVOH TEARS   atorvastatin  (LIPITOR) 40 MG tablet TAKE 1 TABLET BY MOUTH AT  BEDTIME   butalbital -acetaminophen -caffeine  (FIORICET ) 50-325-40 MG tablet TAKE 1 TO 2 TABLETS BY MOUTH EVERY 6 HOURS AS NEEDED FOR HEADACHES(S). MANUFACTURER RECOMMENDS NOT EXCEEDING 6 TABLETS PER DAY.   Cholecalciferol  (VITAMIN D -3 PO) Take 1 capsule by mouth daily.   dexamethasone  (DECADRON ) 1 MG tablet Take 1 mg by mouth daily.   docusate sodium  (COLACE) 100 MG capsule Take 100 mg by mouth 2 (two) times daily as needed for mild constipation or moderate constipation.   folic acid  (FOLVITE ) 1 MG tablet Take 1 mg by mouth daily.   HYDROcodone -acetaminophen  (NORCO/VICODIN) 5-325 MG tablet Take 1 tablet by mouth.    lidocaine -prilocaine (EMLA) cream Apply 1 application  topically as needed.   metoprolol  succinate (TOPROL  XL) 25 MG 24 hr tablet Take 1/2 tablet (12.5mg ) daily for 1 week. Take 1 tablet (25mg ) daily after.   mupirocin  ointment (BACTROBAN ) 2 % Apply to nasal area 1-2 times daily   pantoprazole  (PROTONIX ) 40 MG tablet Take 1 tablet (40 mg total) by mouth daily before breakfast.   PARoxetine  (PAXIL ) 20 MG tablet TAKE 3 TABLETS BY MOUTH IN THE  MORNING   polyethylene glycol powder (GLYCOLAX /MIRALAX ) 17 GM/SCOOP powder Take 17 g by mouth daily as needed for mild constipation.   valACYclovir  (VALTREX ) 500 MG tablet TAKE 1 TABLET BY MOUTH DAILY   vitamin E  180 MG (400 UNITS) capsule Take 400 Units by mouth daily.   B-D 3CC LUER-LOK SYR 25GX1 25G X 1 3 ML MISC USE TO INJECT  CYANOCOBALAMIN  ONCE MONTHLY   furosemide  (LASIX ) 20 MG tablet Take 1 tablet (20 mg total) by mouth daily.   [Paused] irbesartan  (AVAPRO ) 150 MG tablet TAKE 1 TABLET BY MOUTH DAILY (Patient not taking: Reported on 09/01/2024)   loratadine (CLARITIN) 10 MG tablet Take 10 mg by mouth daily. (Patient not taking: Reported on 09/01/2024)   moxifloxacin (VIGAMOX) 0.5 % ophthalmic solution Place 1 drop into the right eye 2 (two) times daily. (Patient not taking: Reported on 09/01/2024)   potassium chloride  (KLOR-CON ) 10 MEQ tablet Take 1 tablet (10 mEq total) by mouth daily.   prednisoLONE  acetate (PRED FORTE ) 1 % ophthalmic suspension Place 1 drop into both eyes daily. (Patient not taking: Reported on 09/01/2024)   No facility-administered encounter medications on file as of 09/01/2024.    Physical Exam: Today's Vitals   09/01/24 1024  TempSrc: Oral  Weight: 143 lb (64.9 kg)  Height: 5' 3 (1.6 m)   Body mass index is 25.33 kg/m.  Physical Exam GEN: No acute distress CV: Regular rate and rhythm no murmurs LUNGS: Clear to auscultation bilaterally normal respiratory effort SKIN JOINTS: Warm and dry no rash  Data  Reviewed: Imaging: CTA 10/17/2023-no evidence of pulmonary embolism, extensive pleural thickening, small bilateral loculated effusion, soft tissue fullness in the right hemithorax, chronic architectural distortion, masslike opacities at the lower lobes  CT chest abdomen pelvis report from [facility] 04/26/2024 1.  Increased size of the small loculated left pleural effusion. No new pleural nodularity, however, evaluation is limited in absence of IV contrast.  2.  Stable right breast masses and post radiation fibrosis in the right middle lobe.  3.  Previously described nodule in the ascending colon is not seen on this noncontrast study. Again, recommend colonoscopy when clinically feasible.  4.  Unchanged size of the indeterminate right interpolar renal lesion.   PET scan from [facility] 06/07/2024 1.  Decreased soft tissue thickening and decreased avidity (though still measuring slightly above liver) along the right breast/chest wall, favored posttreatment related changes. However attention follow-up is advised.  2.  No new FDG avid lymphadenopathy or distant metastatic disease.  3.  Similar diffuse low-level FDG uptake associated with the enlarged moderate left pleural effusion.   CTA 06/28/2024-no pulmonary embolism, chronic right chest wall tissue density extending into the middle lobe, right mediastinum, chronic right effusion, progression of left pleural effusion.  Generalized pleural thickening I have reviewed the images personally  PFTs: 04/06/2024 FVC 1.36 [52%], FEV1 1.15 [60%], F/F85, DLCO 6.02 [32%] Moderate restriction, severe diffusion defect  Labs: CTD serology 05/01/2020-ANA 1: 80, nuclear speckled CTD serologies 06/01/2014-ANA 1: 320, cytoplasmic, rheumatoid factor-16, SSA greater than 8  Pleural studies 07/04/2024 LDH 478, total protein 3.5  Cardiac: Echocardiogram 12/17/2023 LVEF 60 to 65%, mild LVH with grade 1 diastolic dysfunction.  RV systolic function is normal, SSA  greater than 8 Assessment & Plan Multifactorial shortness of breath Shortness of breath is likely multifactorial, involving restrictive lung disease, pleural effusion, and potential Sjogren's syndrome ILD. No evidence of pulmonary embolism on recent CT scan. Symptomatic relief with albuterol  inhaler and supplemental oxygen  is advised. - Advise use of albuterol  inhaler for symptomatic relief during episodes of shortness of breath - Continue supplemental oxygen  therapy - Use Boost oxygen  cans as needed for additional oxygen  support  Recurrent left exudative pleural effusion in the setting of malignant thymoma Moderate fluid accumulation on the left side as per recent chest x-ray. Previous thoracentesis on September 15th removed 500 cc of fluid, providing temporary relief. Cultures were negative, and the effusion was exudative with elevated LDH, suggesting an inflammatory process. Cytology was not performed previously, and there is a possibility of recurrence related to thymoma. - Referred to interventional radiology for ultrasound evaluation of pleural effusion - Will consider thoracentesis if significant fluid accumulation is noted - Will obtain cytology during thoracentesis to assess for malignancy  Restrictive lung disease due to radiation and pleural effusion Restrictive lung disease likely due to radiation from thymoma and recurrent pleural effusion, contributing to shortness of breath. - Continue to monitor respiratory status and adjust oxygen  therapy as needed  Chronic hypoxemic respiratory failure on supplemental oxygen  Chronic hypoxemic respiratory failure managed with supplemental oxygen . Oxygen  saturation should be maintained between 90-95% to avoid complications such as headaches and nasal dryness. Current use of supplemental oxygen  includes canned oxygen  and CPAP  with oxygen  supplementation. - Adjust oxygen  flow to maintain saturation between 90-95% - Consider reducing oxygen  flow  when sitting still if saturation remains adequate  Possible Sjogren's syndrome with possible interstitial lung disease Sjogren's syndrome with possible interstitial lung disease contributing to respiratory symptoms. Previous rheumatology referral made, but no follow-up due to ongoing chemotherapy. Current treatment with chemotherapy limits additional immunosuppressive therapy. -Patient would like to cancel rheumatology consult as she cannot get additional immunosuppression due to ongoing chemotherapy.  Obstructive sleep apnea on CPAP Obstructive sleep apnea managed with CPAP. Current settings may be insufficient, causing discomfort and sensation of choking. CPAP settings need evaluation and potential adjustment. - Will obtain CPAP data from [facility] for review - Will consider adjusting CPAP settings based on data review  Headache and nasal dryness with epistaxis secondary to oxygen  therapy Headaches and nasal dryness with epistaxis likely secondary to excessive oxygen  use. Excessive oxygen  can cause inflammation and dryness, leading to these symptoms. - Educated on maintaining oxygen  saturation between 90-95% to prevent excessive oxygen  use - Consider reducing oxygen  flow to alleviate symptoms  Recommendations: Repeat thoracentesis.  Send pleural studies including cytology Supplemental oxygen  Continue CPAP  I personally spent a total of 40 minutes in the care of the patient today including preparing to see the patient, getting/reviewing separately obtained history, performing a medically appropriate exam/evaluation, counseling and educating, referring and communicating with other health care professionals, documenting clinical information in the EHR, independently interpreting results, and communicating results.   Audelia Knape MD [facility] 09/01/2024, 10:38 AM  CC: Tammy Tari DASEN, PA-C  I am already

## 2024-09-01 NOTE — Patient Instructions (Signed)
  VISIT SUMMARY: During your visit, we discussed your worsening shortness of breath, pleural effusion, and ongoing management of thymoma and restrictive lung disease. We also addressed your oxygen  therapy, CPAP use, and associated symptoms such as headaches and nasal dryness.  YOUR PLAN: RECURRENT LEFT EXUDATIVE PLEURAL EFFUSION: You have fluid accumulation on the left side of your chest, likely related to your thymoma. -You are referred to interventional radiology for an ultrasound evaluation of the pleural effusion. -If significant fluid is found, we will consider another thoracentesis. -We will obtain cytology during thoracentesis to check for malignancy.  RESTRICTIVE LUNG DISEASE: Your restrictive lung disease is likely due to radiation from thymoma and recurrent pleural effusion, contributing to your shortness of breath. -We will continue to monitor your respiratory status and adjust your oxygen  therapy as needed.  CHRONIC HYPOXEMIC RESPIRATORY FAILURE: You have chronic low oxygen  levels managed with supplemental oxygen . -Adjust your oxygen  flow to maintain saturation between 90-95%. -Consider reducing oxygen  flow when sitting still if your saturation remains adequate.  SJOGREN'S SYNDROME WITH POSSIBLE INTERSTITIAL LUNG DISEASE: You have Sjogren's syndrome with possible interstitial lung disease contributing to your respiratory symptoms. -Continue follow up with rheumatology for confirmation of autoimmune diagnosis.  OBSTRUCTIVE SLEEP APNEA: You have obstructive sleep apnea managed with CPAP, but current settings may be causing discomfort. -We will obtain CPAP data from [facility] for review. -We will consider adjusting CPAP settings based on data review.  HEADACHE AND NASAL DRYNESS WITH EPISTAXIS: Your headaches and nasal dryness with nosebleeds are likely due to excessive oxygen  use. -Maintain oxygen  saturation between 90-95% to prevent excessive oxygen  use. -Consider reducing  oxygen  flow to alleviate symptoms.                                  Contains text generated by [employer].

## 2024-09-02 ENCOUNTER — Ambulatory Visit (HOSPITAL_COMMUNITY)
Admission: RE | Admit: 2024-09-02 | Discharge: 2024-09-02 | Disposition: A | Discharge status: Home / Self Care | Source: Ambulatory Visit

## 2024-09-02 ENCOUNTER — Ambulatory Visit (HOSPITAL_COMMUNITY)
Admission: RE | Admit: 2024-09-02 | Discharge: 2024-09-02 | Disposition: A | Discharge status: Home / Self Care | Source: Ambulatory Visit | Attending: Pulmonary Disease | Admitting: Pulmonary Disease

## 2024-09-02 DIAGNOSIS — Z853 Personal history of malignant neoplasm of breast: Secondary | ICD-10-CM | POA: Diagnosis not present

## 2024-09-02 DIAGNOSIS — J90 Pleural effusion, not elsewhere classified: Secondary | ICD-10-CM | POA: Diagnosis present

## 2024-09-02 LAB — BODY FLUID CELL COUNT WITH DIFFERENTIAL
Eos, Fluid: 1 %
Lymphs, Fluid: 38 %
Monocyte-Macrophage-Serous Fluid: 18 % — ABNORMAL LOW (ref 50–90)
Neutrophil Count, Fluid: 43 % — ABNORMAL HIGH (ref 0–25)
Total Nucleated Cell Count, Fluid: 327 uL (ref 0–1000)

## 2024-09-02 LAB — PROTEIN, PLEURAL OR PERITONEAL FLUID: Total protein, fluid: 3.4 g/dL

## 2024-09-02 LAB — GLUCOSE, PLEURAL OR PERITONEAL FLUID: Glucose, Fluid: 63 mg/dL

## 2024-09-02 LAB — LACTATE DEHYDROGENASE, PLEURAL OR PERITONEAL FLUID: LD, Fluid: 730 U/L — ABNORMAL HIGH (ref 3–23)

## 2024-09-02 MED ORDER — LIDOCAINE-EPINEPHRINE (PF) 2 %-1:200000 IJ SOLN
INTRAMUSCULAR | Status: AC
  Filled 2024-09-02: qty 20

## 2024-09-02 NOTE — Procedures (Signed)
 PROCEDURE SUMMARY:  Successful image-guided diagnostic and therapeutic thoracentesis from the left chest.  Yielded 300 milliliters of serosanguinous fluid. There is a notably loculated appearance to fluid which may explain low output.  No immediate complications.  EBL: zero Patient tolerated well.   Specimen sent for labs.  Post-procedure CXR ordered and reviewed prior to departure from department.   Please see imaging section of Epic for full dictation.  Oliviagrace Crisanti NP 09/02/2024 11:12 AM

## 2024-09-07 LAB — CYTOLOGY - NON PAP

## 2024-09-08 ENCOUNTER — Other Ambulatory Visit: Payer: Self-pay | Admitting: Pulmonary Disease

## 2024-09-08 DIAGNOSIS — J90 Pleural effusion, not elsewhere classified: Secondary | ICD-10-CM

## 2024-09-10 ENCOUNTER — Emergency Department (HOSPITAL_COMMUNITY)

## 2024-09-10 ENCOUNTER — Other Ambulatory Visit: Payer: Self-pay

## 2024-09-10 ENCOUNTER — Encounter (HOSPITAL_COMMUNITY): Payer: Self-pay

## 2024-09-10 ENCOUNTER — Observation Stay (HOSPITAL_COMMUNITY)
Admission: EM | Admit: 2024-09-10 | Discharge: 2024-09-12 | Disposition: A | Attending: Emergency Medicine | Admitting: Emergency Medicine

## 2024-09-10 DIAGNOSIS — G4733 Obstructive sleep apnea (adult) (pediatric): Secondary | ICD-10-CM | POA: Diagnosis present

## 2024-09-10 DIAGNOSIS — R0602 Shortness of breath: Secondary | ICD-10-CM | POA: Diagnosis present

## 2024-09-10 DIAGNOSIS — I5033 Acute on chronic diastolic (congestive) heart failure: Secondary | ICD-10-CM

## 2024-09-10 DIAGNOSIS — K7581 Nonalcoholic steatohepatitis (NASH): Secondary | ICD-10-CM | POA: Diagnosis not present

## 2024-09-10 DIAGNOSIS — J9611 Chronic respiratory failure with hypoxia: Secondary | ICD-10-CM | POA: Diagnosis not present

## 2024-09-10 DIAGNOSIS — Z853 Personal history of malignant neoplasm of breast: Secondary | ICD-10-CM | POA: Diagnosis not present

## 2024-09-10 DIAGNOSIS — M797 Fibromyalgia: Secondary | ICD-10-CM | POA: Diagnosis present

## 2024-09-10 DIAGNOSIS — I5032 Chronic diastolic (congestive) heart failure: Secondary | ICD-10-CM | POA: Insufficient documentation

## 2024-09-10 DIAGNOSIS — I3139 Other pericardial effusion (noninflammatory): Secondary | ICD-10-CM | POA: Diagnosis present

## 2024-09-10 DIAGNOSIS — R54 Age-related physical debility: Secondary | ICD-10-CM | POA: Insufficient documentation

## 2024-09-10 DIAGNOSIS — E876 Hypokalemia: Secondary | ICD-10-CM

## 2024-09-10 DIAGNOSIS — D62 Acute posthemorrhagic anemia: Secondary | ICD-10-CM | POA: Diagnosis not present

## 2024-09-10 DIAGNOSIS — R040 Epistaxis: Secondary | ICD-10-CM | POA: Insufficient documentation

## 2024-09-10 DIAGNOSIS — D72819 Decreased white blood cell count, unspecified: Secondary | ICD-10-CM | POA: Insufficient documentation

## 2024-09-10 DIAGNOSIS — Z91040 Latex allergy status: Secondary | ICD-10-CM | POA: Diagnosis not present

## 2024-09-10 DIAGNOSIS — Z85238 Personal history of other malignant neoplasm of thymus: Secondary | ICD-10-CM | POA: Diagnosis not present

## 2024-09-10 DIAGNOSIS — Z79899 Other long term (current) drug therapy: Secondary | ICD-10-CM | POA: Diagnosis not present

## 2024-09-10 DIAGNOSIS — I509 Heart failure, unspecified: Secondary | ICD-10-CM

## 2024-09-10 DIAGNOSIS — F419 Anxiety disorder, unspecified: Secondary | ICD-10-CM | POA: Diagnosis not present

## 2024-09-10 DIAGNOSIS — D4989 Neoplasm of unspecified behavior of other specified sites: Secondary | ICD-10-CM | POA: Diagnosis present

## 2024-09-10 DIAGNOSIS — I110 Hypertensive heart disease with heart failure: Secondary | ICD-10-CM | POA: Insufficient documentation

## 2024-09-10 DIAGNOSIS — I10 Essential (primary) hypertension: Secondary | ICD-10-CM | POA: Diagnosis present

## 2024-09-10 DIAGNOSIS — D696 Thrombocytopenia, unspecified: Secondary | ICD-10-CM | POA: Diagnosis not present

## 2024-09-10 DIAGNOSIS — Z8709 Personal history of other diseases of the respiratory system: Secondary | ICD-10-CM | POA: Insufficient documentation

## 2024-09-10 DIAGNOSIS — D649 Anemia, unspecified: Principal | ICD-10-CM

## 2024-09-10 DIAGNOSIS — G43009 Migraine without aura, not intractable, without status migrainosus: Secondary | ICD-10-CM | POA: Diagnosis not present

## 2024-09-10 DIAGNOSIS — H409 Unspecified glaucoma: Secondary | ICD-10-CM | POA: Diagnosis present

## 2024-09-10 DIAGNOSIS — Z8679 Personal history of other diseases of the circulatory system: Secondary | ICD-10-CM | POA: Insufficient documentation

## 2024-09-10 DIAGNOSIS — E785 Hyperlipidemia, unspecified: Secondary | ICD-10-CM | POA: Diagnosis not present

## 2024-09-10 DIAGNOSIS — F32A Depression, unspecified: Secondary | ICD-10-CM | POA: Diagnosis present

## 2024-09-10 DIAGNOSIS — K219 Gastro-esophageal reflux disease without esophagitis: Secondary | ICD-10-CM | POA: Diagnosis present

## 2024-09-10 LAB — BASIC METABOLIC PANEL WITH GFR
Anion gap: 11 (ref 5–15)
BUN: 19 mg/dL (ref 8–23)
CO2: 26 mmol/L (ref 22–32)
Calcium: 8.8 mg/dL — ABNORMAL LOW (ref 8.9–10.3)
Chloride: 101 mmol/L (ref 98–111)
Creatinine, Ser: 1.16 mg/dL — ABNORMAL HIGH (ref 0.44–1.00)
GFR, Estimated: 48 mL/min — ABNORMAL LOW (ref >60)
Glucose, Bld: 163 mg/dL — ABNORMAL HIGH (ref 70–99)
Potassium: 3.2 mmol/L — ABNORMAL LOW (ref 3.5–5.1)
Sodium: 138 mmol/L (ref 135–145)

## 2024-09-10 LAB — MAGNESIUM: Magnesium: 1.8 mg/dL (ref 1.7–2.4)

## 2024-09-10 LAB — CBC
HCT: 18 % — ABNORMAL LOW (ref 36.0–46.0)
Hemoglobin: 5.9 g/dL — CL (ref 12.0–15.0)
MCH: 33 pg (ref 26.0–34.0)
MCHC: 32.8 g/dL (ref 30.0–36.0)
MCV: 100.6 fL — ABNORMAL HIGH (ref 80.0–100.0)
Platelets: 51 K/uL — ABNORMAL LOW (ref 150–400)
RBC: 1.79 MIL/uL — ABNORMAL LOW (ref 3.87–5.11)
RDW: 18.4 % — ABNORMAL HIGH (ref 11.5–15.5)
WBC: 1.5 K/uL — ABNORMAL LOW (ref 4.0–10.5)
nRBC: 0 % (ref 0.0–0.2)

## 2024-09-10 LAB — PROTIME-INR
INR: 1.1 (ref 0.8–1.2)
Prothrombin Time: 15.2 s (ref 11.4–15.2)

## 2024-09-10 LAB — PREPARE RBC (CROSSMATCH)

## 2024-09-10 LAB — APTT: aPTT: 27 s (ref 24–36)

## 2024-09-10 LAB — FIBRINOGEN: Fibrinogen: 710 mg/dL — ABNORMAL HIGH (ref 210–475)

## 2024-09-10 MED ORDER — ALPRAZOLAM 0.25 MG PO TABS
0.1250 mg | ORAL_TABLET | Freq: Every day | ORAL | Status: DC | PRN
Start: 2024-09-10 — End: 2024-09-12
  Administered 2024-09-11 – 2024-09-12 (×2): 0.125 mg via ORAL
  Filled 2024-09-10 (×2): qty 1

## 2024-09-10 MED ORDER — ACETAMINOPHEN 325 MG PO TABS
650.0000 mg | ORAL_TABLET | Freq: Four times a day (QID) | ORAL | Status: DC | PRN
  Administered 2024-09-10 – 2024-09-12 (×2): 650 mg via ORAL
  Filled 2024-09-10 (×2): qty 2

## 2024-09-10 MED ORDER — PAROXETINE HCL 20 MG PO TABS
40.0000 mg | ORAL_TABLET | Freq: Every day | ORAL | Status: DC
  Administered 2024-09-11 – 2024-09-12 (×2): 40 mg via ORAL
  Filled 2024-09-10 (×2): qty 2

## 2024-09-10 MED ORDER — DIPHENHYDRAMINE HCL 50 MG/ML IJ SOLN
25.0000 mg | Freq: Once | INTRAMUSCULAR | Status: AC
  Administered 2024-09-10: 25 mg via INTRAVENOUS
  Filled 2024-09-10: qty 1

## 2024-09-10 MED ORDER — ACETAMINOPHEN 650 MG RE SUPP: 650.0000 mg | Freq: Four times a day (QID) | RECTAL | Status: DC | PRN

## 2024-09-10 MED ORDER — DEXAMETHASONE 0.5 MG PO TABS
1.0000 mg | ORAL_TABLET | Freq: Every day | ORAL | Status: DC
  Administered 2024-09-11 – 2024-09-12 (×2): 1 mg via ORAL
  Filled 2024-09-10 (×2): qty 2

## 2024-09-10 MED ORDER — METOPROLOL SUCCINATE ER 25 MG PO TB24
25.0000 mg | ORAL_TABLET | Freq: Every day | ORAL | Status: DC
  Administered 2024-09-11 – 2024-09-12 (×2): 25 mg via ORAL
  Filled 2024-09-10 (×2): qty 1

## 2024-09-10 MED ORDER — ALBUTEROL SULFATE (2.5 MG/3ML) 0.083% IN NEBU: 3.0000 mL | INHALATION_SOLUTION | Freq: Four times a day (QID) | RESPIRATORY_TRACT | Status: DC | PRN

## 2024-09-10 MED ORDER — SODIUM CHLORIDE 0.9% FLUSH
3.0000 mL | Freq: Two times a day (BID) | INTRAVENOUS | Status: DC
  Administered 2024-09-10 – 2024-09-12 (×4): 3 mL via INTRAVENOUS

## 2024-09-10 MED ORDER — POLYVINYL ALCOHOL 1.4 % OP SOLN: 1.0000 [drp] | Freq: Four times a day (QID) | OPHTHALMIC | Status: DC | PRN

## 2024-09-10 MED ORDER — ATORVASTATIN CALCIUM 40 MG PO TABS
40.0000 mg | ORAL_TABLET | Freq: Every day | ORAL | Status: DC
  Administered 2024-09-10 – 2024-09-11 (×2): 40 mg via ORAL
  Filled 2024-09-10 (×2): qty 1

## 2024-09-10 MED ORDER — ORAL CARE MOUTH RINSE: 15.0000 mL | OROMUCOSAL | Status: DC | PRN

## 2024-09-10 MED ORDER — PANTOPRAZOLE SODIUM 40 MG PO TBEC
40.0000 mg | DELAYED_RELEASE_TABLET | Freq: Every day | ORAL | Status: DC
  Administered 2024-09-11 – 2024-09-12 (×2): 40 mg via ORAL
  Filled 2024-09-10 (×2): qty 1

## 2024-09-10 MED ORDER — POLYETHYLENE GLYCOL 3350 17 G PO PACK: 17.0000 g | PACK | Freq: Every day | ORAL | Status: DC | PRN

## 2024-09-10 MED ORDER — POTASSIUM CHLORIDE CRYS ER 20 MEQ PO TBCR
40.0000 meq | EXTENDED_RELEASE_TABLET | Freq: Once | ORAL | Status: AC
  Administered 2024-09-10: 40 meq via ORAL
  Filled 2024-09-10: qty 2

## 2024-09-10 MED ORDER — SODIUM CHLORIDE 0.9% IV SOLUTION: Freq: Once | INTRAVENOUS | Status: AC

## 2024-09-10 NOTE — Progress Notes (Signed)
 Pt. Unsure on whether or not she wants to wear cpap. Pt. States she will notify if she decides to wear one.

## 2024-09-10 NOTE — H&P (Addendum)
 History and Physical   Autumn Conley FMW:[id number] DOB: 09/25/1946 DOA: 09/10/2024  PCP: Tammy Tari DASEN, PA-C   Patient coming from: Home  Chief Complaint: Epistaxis, shortness of breath  HPI: Autumn Conley is a 78 y.o. female with medical history significant of hypertension, hyperlipidemia, GERD, autoimmune disorder, chronic diastolic CHF, aortic stenosis, glaucoma, migraines, history of breast cancer, thymoma undergoing chemo, anemia, depression, anxiety, restrictive lung disease, OSA on CPAP, pericardial effusion status post pericardial window, NASH, fibromyalgia presenting with epistaxis and shortness of breath.  Patient has had history of frequent nosebleeds since starting on supplemental oxygen .  She gets dry naris on the nasal cannula.  She has tried Ayr gel without significant improvement.  She also has had 8 months of worsening thrombocytopenia in the setting of chemotherapy for thymoma.  She denies fevers, chills, chest pain, abdominal pain, constipation, diarrhea, nausea, vomiting.  ED Course: Vital signs in ED notable for heart rate in the 80s-100s.  Respirate in the 20s.  Requiring 6 L of supplemental oxygen  in the ED per chart.  Lab workup included BMP with potassium 3.2, creatinine stable 1.16, glucose 163, calcium  8.8.  CBC with hemoglobin of 5.9 down from previous baseline 8, platelets 1.5 which is stable for the past 8 months, platelets 51 which is stable for the past 6 months.  PT, PTT, INR normal.  Fibrinogen  710.  Type and screen performed.  Chest x-ray showed increased airspace disease at right lower lobe with small to moderate left pleural effusion with atelectasis versus infiltrate.  Small right pleural effusion also noted.  Patient received 40 mEq p.o. potassium, Benadryl , 2 units PRBC ordered in the ED.  Review of Systems: As per HPI otherwise all other systems reviewed and are negative.  Past Medical History:  Diagnosis Date   Anemia    Anxiety    Breast  cancer (HCC)    Depression    Dyspnea    Fibromyalgia    some; not chronic (11/29/2014)   GERD (gastroesophageal reflux disease)    Glaucoma of both eyes    Headache    Hypertension    IBS (irritable bowel syndrome)    Mitral valve prolapse    NASH (nonalcoholic steatohepatitis)    Osteoarthritis    PONV (postoperative nausea and vomiting)    Stroke (HCC)    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[facility])     Past Surgical History:  Procedure Laterality Date   ABDOMINAL HYSTERECTOMY  1980   APPENDECTOMY  1953   BIOPSY  05/03/2020   Procedure: BIOPSY;  Surgeon: Avram Lupita BRAVO, MD;  Location: [facility] ENDOSCOPY;  Service: Endoscopy;;   BREAST BIOPSY Left    BREAST LUMPECTOMY Left    CATARACT EXTRACTION, BILATERAL  2018   COLONOSCOPY WITH PROPOFOL  N/A 05/03/2020   Procedure: COLONOSCOPY WITH PROPOFOL ;  Surgeon: Avram Lupita BRAVO, MD;  Location: [facility] ENDOSCOPY;  Service: Endoscopy;  Laterality: N/A;   ESOPHAGOGASTRODUODENOSCOPY (EGD) WITH PROPOFOL  N/A 05/03/2020   Procedure: ESOPHAGOGASTRODUODENOSCOPY (EGD) WITH PROPOFOL ;  Surgeon: Avram Lupita BRAVO, MD;  Location: [facility] ENDOSCOPY;  Service: Endoscopy;  Laterality: N/A;   INTERCOSTAL NERVE BLOCK Right 04/19/2021   Procedure: INTERCOSTAL NERVE BLOCK;  Surgeon: Shyrl Linnie KIDD, MD;  Location: [facility] OR;  Service: Thoracic;  Laterality: Right;   IR THORACENTESIS ASP PLEURAL SPACE W/IMG GUIDE  07/04/2024   MASTECTOMY Left ~ 2009   PARASTERNAL EXPLORATION Right 02/14/2019   Procedure: PARASTERNAL MEDIAL EXPLORATION WITH BIOPIES.;  Surgeon: Army Dallas NOVAK, MD;  Location: [facility] OR;  Service: Thoracic;  Laterality: Right;  POLYPECTOMY  05/03/2020   Procedure: POLYPECTOMY;  Surgeon: Avram Lupita BRAVO, MD;  Location: [facility] ENDOSCOPY;  Service: Endoscopy;;   SVT ABLATION N/A 07/29/2023   Procedure: SVT ABLATION;  Surgeon: Waddell Danelle ORN, MD;  Location: [facility] INVASIVE CV LAB;  Service: Cardiovascular;  Laterality: N/A;    Social History  reports that she has never smoked. She has never used  smokeless tobacco. She reports that she does not drink alcohol  and does not use drugs.  Allergies  Allergen Reactions   Latex Rash and Other (See Comments)    Blisters, also    Family History  Problem Relation Age of Onset   Lung cancer Mother 66   Prostate cancer Brother 58   Bladder Cancer Neg Hx    Kidney cancer Neg Hx   Reviewed on admission  Prior to Admission medications   Medication Sig Start Date End Date Taking? Authorizing Provider  albuterol  (VENTOLIN  HFA) 108 (90 Base) MCG/ACT inhaler Inhale 1-2 puffs into the lungs every 6 (six) hours as needed for wheezing or shortness of breath. 10/17/23   Logan Ubaldo NOVAK, PA-C  ALPRAZolam  (XANAX ) 0.25 MG tablet Take 0.5 tablets (0.125 mg total) by mouth daily as needed for anxiety. 02/27/20   Job Lukes, PA  amLODipine (NORVASC) 5 MG tablet Take 5 mg by mouth daily.    [provider]  Artificial Tears ophthalmic solution Place 1 drop into both eyes 4 (four) times daily as needed (DRY EYES). EVOH TEARS    [provider]  atorvastatin  (LIPITOR) 40 MG tablet TAKE 1 TABLET BY MOUTH AT  BEDTIME 05/22/23   Job, [place], PA  B-D 3CC LUER-LOK SYR 25GX1 25G X 1 3 ML MISC USE TO INJECT  CYANOCOBALAMIN  ONCE MONTHLY 05/27/21   Job Lukes, PA  butalbital -acetaminophen -caffeine  (FIORICET ) 50-325-40 MG tablet TAKE 1 TO 2 TABLETS BY MOUTH EVERY 6 HOURS AS NEEDED FOR HEADACHES(S). MANUFACTURER RECOMMENDS NOT EXCEEDING 6 TABLETS PER DAY. 02/18/22   Job Lukes, PA  Cholecalciferol  (VITAMIN D -3 PO) Take 1 capsule by mouth daily.    [provider]  dexamethasone  (DECADRON ) 1 MG tablet Take 1 mg by mouth daily. 01/02/22   [provider]  docusate sodium  (COLACE) 100 MG capsule Take 100 mg by mouth 2 (two) times daily as needed for mild constipation or moderate constipation.    [provider]  folic acid  (FOLVITE ) 1 MG tablet Take 1 mg by mouth daily. 07/23/21   [provider]   furosemide  (LASIX ) 20 MG tablet Take 1 tablet (20 mg total) by mouth daily. 03/28/24 06/26/24  Waddell Danelle ORN, MD  HYDROcodone -acetaminophen  (NORCO/VICODIN) 5-325 MG tablet Take 1 tablet by mouth. 04/25/24   [provider]  irbesartan  (AVAPRO ) 150 MG tablet TAKE 1 TABLET BY MOUTH DAILY Patient not taking: Reported on 09/01/2024 11/09/23   Job Lukes, PA  lidocaine -prilocaine (EMLA) cream Apply 1 application  topically as needed.    [provider]  loratadine (CLARITIN) 10 MG tablet Take 10 mg by mouth daily. Patient not taking: Reported on 09/01/2024    [provider]  metoprolol  succinate (TOPROL  XL) 25 MG 24 hr tablet Take 1/2 tablet (12.5mg ) daily for 1 week. Take 1 tablet (25mg ) daily after. 03/28/24   Waddell Danelle ORN, MD  moxifloxacin (VIGAMOX) 0.5 % ophthalmic solution Place 1 drop into the right eye 2 (two) times daily. Patient not taking: Reported on 09/01/2024 04/29/24   [provider]  mupirocin  ointment (BACTROBAN ) 2 % Apply to nasal area 1-2  times daily 01/27/22   Job Lukes, PA  pantoprazole  (PROTONIX ) 40 MG tablet Take 1 tablet (40 mg total) by mouth daily before breakfast. 05/04/20   Sherrill Cable Latif, DO  PARoxetine  (PAXIL ) 20 MG tablet TAKE 3 TABLETS BY MOUTH IN THE  MORNING 04/09/23   Job Lukes, PA  polyethylene glycol powder (GLYCOLAX /MIRALAX ) 17 GM/SCOOP powder Take 17 g by mouth daily as needed for mild constipation.    [provider]  potassium chloride  (KLOR-CON ) 10 MEQ tablet Take 1 tablet (10 mEq total) by mouth daily. 03/28/24 06/26/24  Waddell Danelle ORN, MD  prednisoLONE  acetate (PRED FORTE ) 1 % ophthalmic suspension Place 1 drop into both eyes daily. Patient not taking: Reported on 09/01/2024 09/07/23   [provider]  valACYclovir  (VALTREX ) 500 MG tablet TAKE 1 TABLET BY MOUTH DAILY 04/09/23   Job Lukes, PA  vitamin E  180 MG (400 UNITS) capsule Take 400 Units by mouth daily.    [provider]    Physical Exam: Vitals:   09/10/24 1330 09/10/24 1439 09/10/24 1451 09/10/24 1456  BP: 125/66 126/60 116/74 126/73  Pulse: 80 62 72 86  Resp:  20 20 18   Temp:  97.8 F (36.6 C)  98 F (36.7 C)  TempSrc:  Oral  Oral  SpO2: 100% 100% 99% 100%  Weight:      Height:        Physical Exam Constitutional:      General: She is not in acute distress.    Appearance: Normal appearance.  HENT:     Head: Normocephalic and atraumatic.     Mouth/Throat:     Mouth: Mucous membranes are moist.     Pharynx: Oropharynx is clear.  Eyes:     Extraocular Movements: Extraocular movements intact.     Pupils: Pupils are equal, round, and reactive to light.  Cardiovascular:     Rate and Rhythm: Normal rate and regular rhythm.     Pulses: Normal pulses.     Heart sounds: Normal heart sounds.  Pulmonary:     Effort: Pulmonary effort is normal. No respiratory distress.     Breath sounds: Normal breath sounds.  Abdominal:     General: Bowel sounds are normal. There is no distension.     Palpations: Abdomen is soft.     Tenderness: There is no abdominal tenderness.  Musculoskeletal:        General: No swelling or deformity.  Skin:    General: Skin is warm and dry.  Neurological:     General: No focal deficit present.     Mental Status: Mental status is at baseline.    Labs on Admission: I have personally reviewed following labs and imaging studies  CBC: Recent Labs  Lab 09/10/24 1221  WBC 1.5*  HGB 5.9*  HCT 18.0*  MCV 100.6*  PLT 51*    Basic Metabolic Panel: Recent Labs  Lab 09/10/24 1221  NA 138  K 3.2*  CL 101  CO2 26  GLUCOSE 163*  BUN 19  CREATININE 1.16*  CALCIUM  8.8*    GFR: Estimated Creatinine Clearance: 32.5 mL/min (A) (by C-G formula based on SCr of 1.16 mg/dL (H)).  Liver Function Tests: No results for input(s): AST, ALT, ALKPHOS, BILITOT, PROT, ALBUMIN in the last 168 hours.  Urine analysis:    Component Value Date/Time    COLORURINE YELLOW 06/28/2020 1641   APPEARANCEUR Clear 01/09/2021 1114   LABSPEC 1.012 06/28/2020 1641   PHURINE 6.5 06/28/2020 1641  GLUCOSEU Negative 01/09/2021 1114   GLUCOSEU NEGATIVE 05/01/2020 1405   HGBUR NEGATIVE 06/28/2020 1641   BILIRUBINUR 1+ 02/13/2021 0741   BILIRUBINUR Negative 01/09/2021 1114   KETONESUR NEGATIVE 06/28/2020 1641   PROTEINUR Positive (A) 02/13/2021 0741   PROTEINUR Negative 01/09/2021 1114   PROTEINUR NEGATIVE 06/28/2020 1641   UROBILINOGEN 1.0 02/13/2021 0741   UROBILINOGEN 1.0 05/01/2020 1405   NITRITE negative 02/13/2021 0741   NITRITE Negative 01/09/2021 1114   NITRITE NEGATIVE 06/28/2020 1641   LEUKOCYTESUR Negative 02/13/2021 0741   LEUKOCYTESUR Negative 01/09/2021 1114   LEUKOCYTESUR 3+ (A) 06/28/2020 1641    Radiological Exams on Admission: DG Chest 2 View Result Date: 09/10/2024 CLINICAL DATA:  Shortness of breath. History of interstitial lung disease and thymoma. EXAM: CHEST - 2 VIEW COMPARISON:  09/02/2024. FINDINGS: The heart size and mediastinal contours are stable. Increased airspace disease is noted in the mid to lower right lung field. A small right pleural effusion is noted. There is a small to moderate left pleural effusion with atelectasis or infiltrate. No pneumothorax is seen. Surgical clips are noted in the left chest wall. A right chest port appear stable. No acute osseous abnormality. IMPRESSION: 1. Increased airspace disease in the mid to lower right lung. 2. Small to moderate left pleural effusion with atelectasis or infiltrate. 3. Small right pleural effusion. Electronically Signed   By: Leita Birmingham M.D.   On: 09/10/2024 14:05   EKG: Independently reviewed.  Sinus rhythm at 100 bpm.  Nonspecific T wave changes.  Assessment/Plan Principal Problem:   Acute on chronic blood loss anemia Active Problems:   HTN (hypertension)   GERD (gastroesophageal reflux disease)   Migraine without aura and without status migrainosus, not  intractable   Anxiety   Depression   Fibromyalgia   OSA (obstructive sleep apnea)   Dyslipidemia   Thymoma -- managed by [facility] - extensive documentation in Care Everywhere   Glaucoma of both eyes   NASH (nonalcoholic steatohepatitis)   CHF (congestive heart failure) (HCC)   Pericardial effusion   S/P lung surgery, follow-up exam   Acute on chronic anemia Epistaxis > Patient with recurrent epistaxis with significant episode in the last day in the setting of dry naris from home oxygen  use.  Ayr gel not helping.  Does not have humidifier at home. > Not currently bleeding in the ED.  Hemoglobin noted to be 5.9 from baseline of 8 8 months ago.  Reporting some associated shortness of breath (is on oxygen  at baseline). > 2 units ordered for transfusion in the ED -Monitor on telemetry overnight - Trend CBC - Supportive care - Humidified oxygen   Chronic respiratory failure with hypoxia > Believed to be secondary to combination of prior radiation for thymoma, autoimmune disease, recurrent pleural effusion. > Follows with pulmonology, on supplemental oxygen . > Recently saw pulmonology on 11/13.  Underwent recurrent thoracentesis on 11/14 with 300 cc of serosanguineous fluid obtained from the left pleural effusion. > Appears to have decreasing her oxygen  requirement.  Has been using higher level oxygen  even at baseline but in the ED at rest she has required as little was 1 L unable to wean further than that due to time constraints.  Will need to confirm her oxygen  requirements at rest and with activity to see if she can reduce the flow rate to help prevent further dry naris and epistaxis. - Pleural effusions stable on chest x-ray - Continue with supplemental oxygen  with humidity - Is currently on Decadron  while awaiting further rheumatologic  workup - Ambulate with pulse ox tomorrow - TOC consult to help with DME for humidified oxygen   Autoimmune disorder > Possible Sjogren syndrome, awaiting  further rheumatology specialist evaluation. - Has been started on Decadron  in the meantime  Thymoma History of pericardial effusion status post pericardial window > Has been followed by [facility] oncology for 2 to 3 years.  Undergone radiation and is on long-term chemotherapy. > History of prior pericardial effusion and pericardial window related to this. - Continue to follow with outpatient oncology on discharge  Thrombocytopenia Leukopenia > Chronic for the past 8 months or so in setting of chemotherapy. - Continue to trend CBC  Hypertension - Some medications have been recently held - Will continue with metoprolol  but hold amlodipine in the setting of this acute on chronic anemia  Hyperlipidemia - Continue home atorvastatin   GERD - Continue home medication  Chronic diastolic CHF Aortic stenosis > Echo in October this year showed EF 60-65%, G1 DD, normal RV function.  Severe aortic stenosis > Not on loop diuretic - Continue metoprolol  - Continue to monitor  Depression Anxiety - Continue home Xanax  and Paxil   OSA - Continue home CPAP  History of breast cancer History of NASH - Noted    DVT prophylaxis: SCDs in the setting of thrombocytopenia Code Status:   Full Family Communication:  Updated at bedside  Disposition Plan:   Patient is from:  Home  Anticipated DC to:  Home  Anticipated DC date:  1 to 2 days  Anticipated DC barriers: None  Consults called:  None Admission status:  Observation, telemetry  Severity of Illness: The appropriate patient status for this patient is OBSERVATION. Observation status is judged to be reasonable and necessary in order to provide the required intensity of service to ensure the patient's safety. The patient's presenting symptoms, physical exam findings, and initial radiographic and laboratory data in the context of their medical condition is felt to place them at decreased risk for further clinical deterioration. Furthermore, it is  anticipated that the patient will be medically stable for discharge from the hospital within 2 midnights of admission.    Marsa KATHEE Scurry MD [facility]  How to contact the [facility] Attending or Consulting provider 7A - 7P or covering provider during after hours 7P -7A, for this patient?   Check the care team in [facility] and look for a) attending/consulting [facility] provider listed and b) the [facility] team listed Log into [email or web address] and use [facility]'s universal password to access. If you do not have the password, please contact the hospital operator. Locate the [facility] provider you are looking for under [facility] and page to a number that you can be directly reached. If you still have difficulty reaching the provider, please page the [facility] (Director on Call) for the Hospitalists listed on amion for assistance.  09/10/2024, 3:22 PM

## 2024-09-10 NOTE — ED Triage Notes (Signed)
 Patient c/o nosebleed on set this am, not being at present. C/o sob denies chest pain

## 2024-09-10 NOTE — ED Triage Notes (Signed)
 Patient reports she has been short of breath for months with exertion. She reports she has interstitial lung disease and thymoma cancer. She gets chemo every 6 weeks for the thymoma. She reports she started having nosebleeds at 3am but it got worsened. She states she has sores in her nose. She is chronically on 5-6L of oxygen .

## 2024-09-10 NOTE — ED Triage Notes (Signed)
 Husband reports that she had 2.5 liters of fluid drawn off of her legs within the past few weeks.

## 2024-09-10 NOTE — ED Notes (Signed)
Admitting MD in to see, at BS.  

## 2024-09-10 NOTE — ED Provider Notes (Signed)
 [name] Provider Note   CSN: [id number] Arrival date & time: 09/10/24  1156     Patient presents with: Shortness of Breath and Epistaxis   Autumn Conley is a 78 y.o. female with primary concern for epistaxis, frequent nosebleeds since being placed on oxygen  by nasal cannula, does not have a humidifier at home.  States that she has been using A[place] gel, but continues to have bleeding primarily from left nostril but now starting to have from the right nostril as well.  Also endorses increasing shortness of breath, has interstitial lung disease and thymoma of which she is getting current treatment at [facility] for the same, however states that her shortness of breath is acutely worsened.  Review of previous diagnoses shows hypertension, migraine, IBS, thymoma, breast cancer, diastolic heart failure.  Review of previous lab results shows a hemoglobin of 8.0 on 30 August 2024, has been chronically anemic, currently receiving iron  infusions.    Shortness of Breath Epistaxis      Prior to Admission medications   Medication Sig Start Date End Date Taking? Authorizing Provider  albuterol  (VENTOLIN  HFA) 108 (90 Base) MCG/ACT inhaler Inhale 1-2 puffs into the lungs every 6 (six) hours as needed for wheezing or shortness of breath. 10/17/23   Logan Ubaldo NOVAK, PA-C  ALPRAZolam  (XANAX ) 0.25 MG tablet Take 0.5 tablets (0.125 mg total) by mouth daily as needed for anxiety. 02/27/20   Job Lukes, PA  amLODipine (NORVASC) 5 MG tablet Take 5 mg by mouth daily.    [provider]  Artificial Tears ophthalmic solution Place 1 drop into both eyes 4 (four) times daily as needed (DRY EYES). EVOH TEARS    [provider]  atorvastatin  (LIPITOR) 40 MG tablet TAKE 1 TABLET BY MOUTH AT  BEDTIME 05/22/23   Job, [place], PA  B-D 3CC LUER-LOK SYR 25GX1 25G X 1 3 ML MISC USE TO INJECT  CYANOCOBALAMIN  ONCE MONTHLY 05/27/21   Job Lukes, PA  butalbital -acetaminophen -caffeine  (FIORICET ) 50-325-40 MG tablet TAKE 1 TO 2 TABLETS BY MOUTH EVERY 6 HOURS AS NEEDED FOR HEADACHES(S). MANUFACTURER RECOMMENDS NOT EXCEEDING 6 TABLETS PER DAY. 02/18/22   Job Lukes, PA  Cholecalciferol  (VITAMIN D -3 PO) Take 1 capsule by mouth daily.    [provider]  dexamethasone  (DECADRON ) 1 MG tablet Take 1 mg by mouth daily. 01/02/22   [provider]  docusate sodium  (COLACE) 100 MG capsule Take 100 mg by mouth 2 (two) times daily as needed for mild constipation or moderate constipation.    [provider]  folic acid  (FOLVITE ) 1 MG tablet Take 1 mg by mouth daily. 07/23/21   [provider]  furosemide  (LASIX ) 20 MG tablet Take 1 tablet (20 mg total) by mouth daily. 03/28/24 06/26/24  Waddell Danelle ORN, MD  HYDROcodone -acetaminophen  (NORCO/VICODIN) 5-325 MG tablet Take 1 tablet by mouth. 04/25/24   [provider]  irbesartan  (AVAPRO ) 150 MG tablet TAKE 1 TABLET BY MOUTH DAILY Patient not taking: Reported on 09/01/2024 11/09/23   Job Lukes, PA  lidocaine -prilocaine (EMLA) cream Apply 1 application  topically as needed.    [provider]  loratadine (CLARITIN) 10 MG tablet Take 10 mg by mouth daily. Patient not taking: Reported on 09/01/2024    [provider]  metoprolol  succinate (TOPROL  XL) 25 MG 24 hr tablet Take 1/2 tablet (12.5mg ) daily for 1 week. Take 1 tablet (25mg ) daily after. 03/28/24   Waddell Danelle ORN, MD  moxifloxacin (  VIGAMOX) 0.5 % ophthalmic solution Place 1 drop into the right eye 2 (two) times daily. Patient not taking: Reported on 09/01/2024 04/29/24   [provider]  mupirocin  ointment (BACTROBAN ) 2 % Apply to nasal area 1-2 times daily 01/27/22   Job Lukes, PA  pantoprazole  (PROTONIX ) 40 MG tablet Take 1 tablet (40 mg total) by mouth daily before breakfast. 05/04/20   Sherrill Cable Latif, DO  PARoxetine  (PAXIL ) 20 MG tablet TAKE 3 TABLETS BY MOUTH  IN THE  MORNING 04/09/23   Job Lukes, PA  polyethylene glycol powder (GLYCOLAX /MIRALAX ) 17 GM/SCOOP powder Take 17 g by mouth daily as needed for mild constipation.    [provider]  potassium chloride  (KLOR-CON ) 10 MEQ tablet Take 1 tablet (10 mEq total) by mouth daily. 03/28/24 06/26/24  Waddell Danelle ORN, MD  prednisoLONE  acetate (PRED FORTE ) 1 % ophthalmic suspension Place 1 drop into both eyes daily. Patient not taking: Reported on 09/01/2024 09/07/23   [provider]  valACYclovir  (VALTREX ) 500 MG tablet TAKE 1 TABLET BY MOUTH DAILY 04/09/23   Job Lukes, PA  vitamin E  180 MG (400 UNITS) capsule Take 400 Units by mouth daily.    [provider]    Allergies: Latex    Review of Systems  Constitutional:  Positive for fatigue.  HENT:  Positive for nosebleeds.   Respiratory:  Positive for shortness of breath.   Neurological:  Positive for weakness.  All other systems reviewed and are negative.   Updated Vital Signs BP 126/73   Pulse 86   Temp 98 F (36.7 C) (Oral)   Resp 18   Ht 5' 3 (1.6 m)   Wt 60.8 kg   SpO2 100%   BMI 23.74 kg/m   Physical Exam Vitals and nursing note reviewed.  Constitutional:      General: She is not in acute distress.    Appearance: Normal appearance.  HENT:     Head: Normocephalic and atraumatic.     Nose:     Right Nostril: No occlusion.     Left Nostril: No occlusion.     Comments: There is noted dried blood in the bilateral naris with the left nostril being more present.  Nares are patent though narrow due to blood clots present in the nasopharynx.  Evaluation of the posterior oropharynx did not show any postnasal drip, posterior hemorrhage.    Mouth/Throat:     Mouth: Mucous membranes are moist.     Pharynx: Oropharynx is clear.  Eyes:     Extraocular Movements: Extraocular movements intact.     Conjunctiva/sclera: Conjunctivae normal.     Pupils: Pupils are equal, round, and reactive to light.   Cardiovascular:     Rate and Rhythm: Normal rate and regular rhythm.     Pulses: Normal pulses.     Heart sounds: Normal heart sounds. No murmur heard.    No friction rub. No gallop.  Pulmonary:     Effort: Pulmonary effort is normal.     Breath sounds: Normal breath sounds.  Abdominal:     General: Abdomen is flat. Bowel sounds are normal.     Palpations: Abdomen is soft.  Musculoskeletal:        General: Normal range of motion.     Cervical back: Normal range of motion and neck supple.     Right lower leg: No edema.     Left lower leg: No edema.  Skin:    General: Skin is warm and dry.  Capillary Refill: Capillary refill takes less than 2 seconds.  Neurological:     General: No focal deficit present.     Mental Status: She is alert. Mental status is at baseline.  Psychiatric:        Mood and Affect: Mood normal.     (all labs ordered are listed, but only abnormal results are displayed) Labs Reviewed  BASIC METABOLIC PANEL WITH GFR - Abnormal; Notable for the following components:      Result Value   Potassium 3.2 (*)    Glucose, Bld 163 (*)    Creatinine, Ser 1.16 (*)    Calcium  8.8 (*)    GFR, Estimated 48 (*)    All other components within normal limits  CBC - Abnormal; Notable for the following components:   WBC 1.5 (*)    RBC 1.79 (*)    Hemoglobin 5.9 (*)    HCT 18.0 (*)    MCV 100.6 (*)    RDW 18.4 (*)    Platelets 51 (*)    All other components within normal limits  FIBRINOGEN  - Abnormal; Notable for the following components:   Fibrinogen  710 (*)    All other components within normal limits  PROTIME-INR  APTT  CBC  MAGNESIUM   TYPE AND SCREEN  PREPARE RBC (CROSSMATCH)    EKG: None  Radiology: DG Chest 2 View Result Date: 09/10/2024 CLINICAL DATA:  Shortness of breath. History of interstitial lung disease and thymoma. EXAM: CHEST - 2 VIEW COMPARISON:  09/02/2024. FINDINGS: The heart size and mediastinal contours are stable. Increased airspace  disease is noted in the mid to lower right lung field. A small right pleural effusion is noted. There is a small to moderate left pleural effusion with atelectasis or infiltrate. No pneumothorax is seen. Surgical clips are noted in the left chest wall. A right chest port appear stable. No acute osseous abnormality. IMPRESSION: 1. Increased airspace disease in the mid to lower right lung. 2. Small to moderate left pleural effusion with atelectasis or infiltrate. 3. Small right pleural effusion. Electronically Signed   By: Leita Birmingham M.D.   On: 09/10/2024 14:05     Procedures   Medications Ordered in the ED  0.9 %  sodium chloride  infusion (Manually program via Guardrails IV Fluids) (has no administration in time range)  atorvastatin  (LIPITOR) tablet 40 mg (has no administration in time range)  metoprolol  succinate (TOPROL -XL) 24 hr tablet 25 mg (has no administration in time range)  ALPRAZolam  (XANAX ) tablet 0.125 mg (has no administration in time range)  PARoxetine  (PAXIL ) tablet 40 mg (has no administration in time range)  dexamethasone  (DECADRON ) tablet 1 mg (has no administration in time range)  pantoprazole  (PROTONIX ) EC tablet 40 mg (has no administration in time range)  albuterol  (PROVENTIL ) (2.5 MG/3ML) 0.083% nebulizer solution 3 mL (has no administration in time range)  Artificial Tears SOLN 1 drop (has no administration in time range)  sodium chloride  flush (NS) 0.9 % injection 3 mL (has no administration in time range)  acetaminophen  (TYLENOL ) tablet 650 mg (has no administration in time range)    Or  acetaminophen  (TYLENOL ) suppository 650 mg (has no administration in time range)  polyethylene glycol (MIRALAX  / GLYCOLAX ) packet 17 g (has no administration in time range)  diphenhydrAMINE  (BENADRYL ) injection 25 mg (25 mg Intravenous Given 09/10/24 1420)  potassium chloride  SA (KLOR-CON  M) CR tablet 40 mEq (40 mEq Oral Given 09/10/24 1422)  Medical Decision Making Amount and/or Complexity of Data Reviewed Labs: ordered. Radiology: ordered.  Risk Prescription drug management. Decision regarding hospitalization.   Medical Decision Making:   Shandora Koogler is a 78 y.o. female who presented to the ED today with weakness and fatigue as well as epistaxis detailed above.    Additional history discussed with patient's family/caregivers.  External chart has been reviewed including outpatient labs, imaging, previous admission records. Patient's presentation is complicated by their history of thymoma, interstitial lung disease.  Patient placed on continuous vitals and telemetry monitoring while in ED which was reviewed periodically.  Complete initial physical exam performed, notably the patient  was alert and oriented no apparent distress.  There is visible dried blood in the bilateral nares.   Dry and erythematous nasal mucosa are appreciated bilaterally as well.  Reviewed and confirmed nursing documentation for past medical history, family history, social history.    Initial Assessment:   With the patient's presentation of epistaxis as well as generalized weakness and fatigue, regarding the epistaxis suspect this is secondary to desiccation of the nasal mucosa due to oxygen  administration.  Regarding the generalized weakness and fatigue, suspect this may be related to either infectious pathology and/or chronic/acute anemia.   Initial Plan:  Coagulation studies secondary to possible need for blood products. Screening labs including CBC and Metabolic panel to evaluate for infectious or metabolic etiology of disease.  CXR to evaluate for structural/infectious intrathoracic pathology.  EKG to evaluate for cardiac pathology Objective evaluation as below reviewed   Initial Study Results:   Laboratory  All laboratory results reviewed without evidence of clinically relevant pathology.   Exceptions include: CBC shows a hemoglobin of 5.9  with MCV of 100.6.  He also shows a leukopenia of 1.5, and thrombocytopenia of 51 suggesting likely pancytopenia and aplastic anemia.  Creatinine is elevated at 1.16 with GFR decreased to 48 however this did appears to be baseline for the patient given review of previous findings.  Fibrinogen  elevated to 710.  EKG EKG was reviewed independently. Rate, rhythm, axis, intervals all examined and without medically relevant abnormality. ST segments without concerns for elevations.    Radiology:  All images reviewed independently. Agree with radiology report at this time.   DG Chest 2 View Result Date: 09/10/2024 CLINICAL DATA:  Shortness of breath. History of interstitial lung disease and thymoma. EXAM: CHEST - 2 VIEW COMPARISON:  09/02/2024. FINDINGS: The heart size and mediastinal contours are stable. Increased airspace disease is noted in the mid to lower right lung field. A small right pleural effusion is noted. There is a small to moderate left pleural effusion with atelectasis or infiltrate. No pneumothorax is seen. Surgical clips are noted in the left chest wall. A right chest port appear stable. No acute osseous abnormality. IMPRESSION: 1. Increased airspace disease in the mid to lower right lung. 2. Small to moderate left pleural effusion with atelectasis or infiltrate. 3. Small right pleural effusion. Electronically Signed   By: Leita Birmingham M.D.   On: 09/10/2024 14:05    Consults: Case discussed with Dr. Seena with hospitalist team.   Reassessment and Plan:   Assessment of the hemoglobin did show critically low hemoglobin of 5.9.  Initiated blood transfusion, also provided 25 mg of diphenhydramine  to prophylactically manage any transfusion reaction.  She also had a hypokalemia of 3.2, provided with oral potassium repletion.  Given the findings of anemia and hypokalemia, and rapid change from previous findings of the CBC, finding it likely is patient  require admission for continued blood products  and for continued monitoring of the serum potassium.  This was discussed thoroughly with patient and she understands and agrees with the need for admission at this time.  Case was discussed with hospitalist team as previously noted who accept this patient for admission for continued monitoring of hemoglobin and potassium.       Final diagnoses:  Anemia, unspecified type  Hypokalemia  Epistaxis    ED Discharge Orders     None          Myriam Dorn BROCKS, [place] 09/10/24 1532    Garrick Charleston, MD 09/11/24 [phone]

## 2024-09-11 DIAGNOSIS — D62 Acute posthemorrhagic anemia: Secondary | ICD-10-CM | POA: Diagnosis not present

## 2024-09-11 DIAGNOSIS — E785 Hyperlipidemia, unspecified: Secondary | ICD-10-CM | POA: Diagnosis not present

## 2024-09-11 DIAGNOSIS — F419 Anxiety disorder, unspecified: Secondary | ICD-10-CM | POA: Diagnosis not present

## 2024-09-11 DIAGNOSIS — F32A Depression, unspecified: Secondary | ICD-10-CM | POA: Diagnosis not present

## 2024-09-11 LAB — CBC
HCT: 25.6 % — ABNORMAL LOW (ref 36.0–46.0)
Hemoglobin: 8.8 g/dL — ABNORMAL LOW (ref 12.0–15.0)
MCH: 31.7 pg (ref 26.0–34.0)
MCHC: 34.4 g/dL (ref 30.0–36.0)
MCV: 92.1 fL (ref 80.0–100.0)
Platelets: 39 K/uL — ABNORMAL LOW (ref 150–400)
RBC: 2.78 MIL/uL — ABNORMAL LOW (ref 3.87–5.11)
RDW: 19.9 % — ABNORMAL HIGH (ref 11.5–15.5)
WBC: 2.1 K/uL — ABNORMAL LOW (ref 4.0–10.5)
nRBC: 0 % (ref 0.0–0.2)

## 2024-09-11 LAB — TYPE AND SCREEN
ABO/RH(D): O POS
Antibody Screen: NEGATIVE
Unit division: 0
Unit division: 0

## 2024-09-11 LAB — COMPREHENSIVE METABOLIC PANEL WITH GFR
ALT: 27 U/L (ref 0–44)
AST: 28 U/L (ref 15–41)
Albumin: 2.3 g/dL — ABNORMAL LOW (ref 3.5–5.0)
Alkaline Phosphatase: 99 U/L (ref 38–126)
Anion gap: 9 (ref 5–15)
BUN: 28 mg/dL — ABNORMAL HIGH (ref 8–23)
CO2: 31 mmol/L (ref 22–32)
Calcium: 9.1 mg/dL (ref 8.9–10.3)
Chloride: 103 mmol/L (ref 98–111)
Creatinine, Ser: 1.08 mg/dL — ABNORMAL HIGH (ref 0.44–1.00)
GFR, Estimated: 52 mL/min — ABNORMAL LOW (ref >60)
Glucose, Bld: 107 mg/dL — ABNORMAL HIGH (ref 70–99)
Potassium: 4.4 mmol/L (ref 3.5–5.1)
Sodium: 143 mmol/L (ref 135–145)
Total Bilirubin: 0.7 mg/dL (ref 0.0–1.2)
Total Protein: 6.6 g/dL (ref 6.5–8.1)

## 2024-09-11 LAB — BPAM RBC
Blood Product Expiration Date: 202512192359
Blood Product Expiration Date: 202512192359
ISSUE DATE / TIME: 202511221429
ISSUE DATE / TIME: 202511221746
Unit Type and Rh: 5100
Unit Type and Rh: 5100

## 2024-09-11 LAB — URINALYSIS, ROUTINE W REFLEX MICROSCOPIC
Bilirubin Urine: NEGATIVE
Glucose, UA: NEGATIVE mg/dL
Ketones, ur: NEGATIVE mg/dL
Nitrite: NEGATIVE
Protein, ur: 100 mg/dL — AB
Specific Gravity, Urine: 1.016 (ref 1.005–1.030)
WBC, UA: >50 WBC/hpf (ref 0–5)
pH: 6 (ref 5.0–8.0)

## 2024-09-11 MED ORDER — VITAMIN B-12 1000 MCG PO TABS
1000.0000 ug | ORAL_TABLET | Freq: Every day | ORAL | Status: DC
  Administered 2024-09-11 – 2024-09-12 (×2): 1000 ug via ORAL
  Filled 2024-09-11 (×2): qty 1

## 2024-09-11 MED ORDER — FOLIC ACID 1 MG PO TABS
2.0000 mg | ORAL_TABLET | Freq: Every day | ORAL | Status: DC
  Administered 2024-09-11 – 2024-09-12 (×2): 2 mg via ORAL
  Filled 2024-09-11 (×2): qty 2

## 2024-09-11 MED ORDER — SALINE SPRAY 0.65 % NA SOLN
1.0000 | NASAL | Status: DC | PRN
  Administered 2024-09-11: 1 via NASAL
  Filled 2024-09-11: qty 44

## 2024-09-11 NOTE — Progress Notes (Signed)
 PROGRESS NOTE        PATIENT DETAILS Name: Autumn Conley Age: 78 y.o. Sex: female Date of Birth: 24-Mar-1946 Admit Date: 09/10/2024 Admitting Physician Marsa KATHEE Scurry, MD ERE:[email or web address], Tari DASEN, PA-C  Brief Summary: Patient is a 78 y.o.  female with history of malignant thymoma on chemotherapy (last received 11/11 at [facility])-presumed ILD with chronic hypoxic respiratory failure on home O2-presented to the hospital with fatigue/exertional dyspnea and intermittent epistaxis x 2 days-found to have Hb of 5.9.  Significant events: 11/22>> admit to [facility].  Significant studies: CXR: Increased airspace disease-mid/right lower lung (chronic issue).  Small to moderate left pleural effusion, small right pleural effusion.  Significant microbiology data: None  Procedures: None  Consults: None  Subjective: No epistaxis overnight-titrated down to 1 L of oxygen  at rest.  Per daughter at bedside-she is on almost 6 L at home.  Objective: Vitals: Blood pressure 125/81, pulse 80, temperature 97.7 F (36.5 C), temperature source Oral, resp. rate 20, height 5' 3 (1.6 m), weight 62.9 kg, SpO2 94%.   Exam: Gen Exam:Alert awake-not in any distress HEENT:atraumatic, normocephalic Chest: B/L clear to auscultation anteriorly CVS:S1S2 regular Abdomen:soft non tender, non distended Extremities:no edema Neurology: Non focal Skin: no rash  Pertinent Labs/Radiology:    Latest Ref Rng & Units 09/11/2024    4:02 AM 09/10/2024   12:21 PM 12/28/2023    5:27 AM  CBC  WBC 4.0 - 10.5 K/uL 2.1  1.5  2.0   Hemoglobin 12.0 - 15.0 g/dL 8.8  5.9  7.8   Hematocrit 36.0 - 46.0 % 25.6  18.0  22.8   Platelets 150 - 400 K/uL 39  51  84     Lab Results  Component Value Date   NA 143 09/11/2024   K 4.4 09/11/2024   CL 103 09/11/2024   CO2 31 09/11/2024      Assessment/Plan: Epistaxis Probably secondary to home O2 use/dry nose/trauma from nasal cannula. Resolved-some  dried blood/clots on exam in the nasal cavity bilaterally. Keep nasal cavity moist with nasal saline spray Monitor for now  Multifactorial anemia Due to combination of anemia related to chronic disease/recent chemo-in some acute blood loss related to epistaxis Hb stable after 2 units of PRBC Follow CBC  Thrombocytopenia Worsening-thankfully no epistaxis today Probably due to recent chemotherapy Watch closely-repeat CBC tomorrow morning-if worsens further-May need to touch base with hematology/oncology.  Leukopenia Likely in the setting of chemotherapy Repeat CBC.  History of malignant thymoma Getting [place] infusions at [facility]-last on 11/11. Maintained on folic acid /vitamin B12 (gets monthly vitamin B12 injections).  History of ILD Chronic hypoxic respiratory failure (requiring up to 6 L of oxygen  at home) Supportive care Has been titrated down to 1 L at rest-probably requires more with activity due to deconditioning/anemia/ILD/thymoma etc.  Possible Sjogren syndrome Has had outpatient workup-apparently has been referred to rheumatology Continue with Decadron  in the meantime.  History of NASH History of breast cancer Supportive care  HTN BP stable Metoprolol  Amlodipine on hold  Chronic HFpEF Euvolemic  HLD Statin  GERD PPI  OSA CPAP nightly  Anxiety/depression Continue Paxil /Xanax .  Debility/deconditioning PT/OT eval.  Code status:   Code Status: Full Code   DVT Prophylaxis: SCDs Start: 09/10/24 1500   Family Communication: Daughter at bedside   Disposition Plan: Status is: Observation The patient will require care spanning > 2  midnights and should be moved to inpatient because: Severity of illness-worsening thrombocytopenia-recent epistaxis-not yet stable for discharge-plan is to repeat CBC-if thrombocytopenia worse-May need platelet transfusion/oncology evaluation.   Planned Discharge Destination:Home health   Diet: Diet Order              Diet regular Room service appropriate? Yes; Fluid consistency: Thin  Diet effective now                     Antimicrobial agents: Anti-infectives (From admission, onward)    None        MEDICATIONS: Scheduled Meds:  atorvastatin   40 mg Oral QHS   vitamin B-12  1,000 mcg Oral Daily   dexamethasone   1 mg Oral Daily   folic acid   2 mg Oral Daily   metoprolol  succinate  25 mg Oral Daily   pantoprazole   40 mg Oral QAC breakfast   PARoxetine   40 mg Oral Daily   sodium chloride  flush  3 mL Intravenous Q12H   Continuous Infusions: PRN Meds:.acetaminophen  **OR** acetaminophen , albuterol , ALPRAZolam , artificial tears, mouth rinse, polyethylene glycol, sodium chloride    I have personally reviewed following labs and imaging studies  LABORATORY DATA: CBC: Recent Labs  Lab 09/10/24 1221 09/11/24 0402  WBC 1.5* 2.1*  HGB 5.9* 8.8*  HCT 18.0* 25.6*  MCV 100.6* 92.1  PLT 51* 39*    Basic Metabolic Panel: Recent Labs  Lab 09/10/24 1221 09/11/24 0402  NA 138 143  K 3.2* 4.4  CL 101 103  CO2 26 31  GLUCOSE 163* 107*  BUN 19 28*  CREATININE 1.16* 1.08*  CALCIUM  8.8* 9.1  MG 1.8  --     GFR: Estimated Creatinine Clearance: 37.7 mL/min (A) (by C-G formula based on SCr of 1.08 mg/dL (H)).  Liver Function Tests: Recent Labs  Lab 09/11/24 0402  AST 28  ALT 27  ALKPHOS 99  BILITOT 0.7  PROT 6.6  ALBUMIN 2.3*   No results for input(s): LIPASE, AMYLASE in the last 168 hours. No results for input(s): AMMONIA in the last 168 hours.  Coagulation Profile: Recent Labs  Lab 09/10/24 1307  INR 1.1    Cardiac Enzymes: No results for input(s): CKTOTAL, CKMB, CKMBINDEX, TROPONINI in the last 168 hours.  BNP (last 3 results) No results for input(s): PROBNP in the last 8760 hours.  Lipid Profile: No results for input(s): CHOL, HDL, LDLCALC, TRIG, CHOLHDL, LDLDIRECT in the last 72 hours.  Thyroid  Function Tests: No results  for input(s): TSH, T4TOTAL, FREET4, T3FREE, THYROIDAB in the last 72 hours.  Anemia Panel: No results for input(s): VITAMINB12, FOLATE, FERRITIN, TIBC, IRON , RETICCTPCT in the last 72 hours.  Urine analysis:    Component Value Date/Time   COLORURINE YELLOW 09/11/2024 0352   APPEARANCEUR CLOUDY (A) 09/11/2024 0352   APPEARANCEUR Clear 01/09/2021 1114   LABSPEC 1.016 09/11/2024 0352   PHURINE 6.0 09/11/2024 0352   GLUCOSEU NEGATIVE 09/11/2024 0352   GLUCOSEU NEGATIVE 05/01/2020 1405   HGBUR SMALL (A) 09/11/2024 0352   BILIRUBINUR NEGATIVE 09/11/2024 0352   BILIRUBINUR 1+ 02/13/2021 0741   BILIRUBINUR Negative 01/09/2021 1114   KETONESUR NEGATIVE 09/11/2024 0352   PROTEINUR 100 (A) 09/11/2024 0352   UROBILINOGEN 1.0 02/13/2021 0741   UROBILINOGEN 1.0 05/01/2020 1405   NITRITE NEGATIVE 09/11/2024 0352   LEUKOCYTESUR LARGE (A) 09/11/2024 0352    Sepsis Labs: Lactic Acid, Venous    Component Value Date/Time   LATICACIDVEN 0.9 12/26/2023 1158    MICROBIOLOGY: Recent Results (from the  past 240 hours)  Body fluid culture w Gram Stain     Status: None (Preliminary result)   Collection Time: 09/02/24 11:00 AM   Specimen: Pleura  Result Value Ref Range Status   Specimen Description   Final    PLEURAL Performed at [facility], [address] [place], [place] [address]    Special Requests   Final    NONE Performed at [facility], [address], [place], [place] [address]    Gram Stain   Final    FEW WBC PRESENT, PREDOMINANTLY MONONUCLEAR NO ORGANISMS SEEN    Culture   Final    NO GROWTH 2 DAYS Performed at [facility], [address], [place], [place] [address]    Report Status PENDING  Incomplete    RADIOLOGY STUDIES/RESULTS: DG Chest 2 View Result Date: 09/10/2024 CLINICAL DATA:  Shortness of breath. History of interstitial lung disease and thymoma. EXAM: CHEST - 2 VIEW COMPARISON:  09/02/2024.  FINDINGS: The heart size and mediastinal contours are stable. Increased airspace disease is noted in the mid to lower right lung field. A small right pleural effusion is noted. There is a small to moderate left pleural effusion with atelectasis or infiltrate. No pneumothorax is seen. Surgical clips are noted in the left chest wall. A right chest port appear stable. No acute osseous abnormality. IMPRESSION: 1. Increased airspace disease in the mid to lower right lung. 2. Small to moderate left pleural effusion with atelectasis or infiltrate. 3. Small right pleural effusion. Electronically Signed   By: Leita Birmingham M.D.   On: 09/10/2024 14:05     LOS: 0 days   Donalda Applebaum, MD  [facility]    To contact the attending provider between 7A-7P or the covering provider during after hours 7P-7A, please log into the web site [email or web address] and access using universal [place] password for that web site. If you do not have the password, please call the hospital operator.  09/11/2024, 9:58 AM

## 2024-09-11 NOTE — Care Management Obs Status (Signed)
 MEDICARE OBSERVATION STATUS NOTIFICATION   Patient Details  Name: Autumn Conley MRN: [id number] Date of Birth: [birth year]   Medicare Observation Status Notification Given:  Yes    Marval Gell, RN 09/11/2024, 1:07 PM

## 2024-09-11 NOTE — Plan of Care (Signed)

## 2024-09-12 DIAGNOSIS — F419 Anxiety disorder, unspecified: Secondary | ICD-10-CM | POA: Diagnosis not present

## 2024-09-12 DIAGNOSIS — I5033 Acute on chronic diastolic (congestive) heart failure: Secondary | ICD-10-CM | POA: Diagnosis not present

## 2024-09-12 DIAGNOSIS — I10 Essential (primary) hypertension: Secondary | ICD-10-CM | POA: Diagnosis not present

## 2024-09-12 LAB — CBC
HCT: 22.9 % — ABNORMAL LOW (ref 36.0–46.0)
Hemoglobin: 7.8 g/dL — ABNORMAL LOW (ref 12.0–15.0)
MCH: 31.5 pg (ref 26.0–34.0)
MCHC: 34.1 g/dL (ref 30.0–36.0)
MCV: 92.3 fL (ref 80.0–100.0)
Platelets: 34 K/uL — ABNORMAL LOW (ref 150–400)
RBC: 2.48 MIL/uL — ABNORMAL LOW (ref 3.87–5.11)
RDW: 19.6 % — ABNORMAL HIGH (ref 11.5–15.5)
WBC: 2.2 K/uL — ABNORMAL LOW (ref 4.0–10.5)
nRBC: 1.4 % — ABNORMAL HIGH (ref 0.0–0.2)

## 2024-09-12 LAB — BODY FLUID CULTURE W GRAM STAIN: Culture: NO GROWTH

## 2024-09-12 MED ORDER — SODIUM CHLORIDE 0.9 % IV SOLN
500.0000 mg | Freq: Once | INTRAVENOUS | Status: AC
  Administered 2024-09-12: 500 mg via INTRAVENOUS
  Filled 2024-09-12: qty 25

## 2024-09-12 MED ORDER — IRON SUCROSE 500 MG IVPB - SIMPLE MED
500.0000 mg | Freq: Once | INTRAVENOUS | Status: DC
  Filled 2024-09-12: qty 275

## 2024-09-12 NOTE — Progress Notes (Signed)
 DISCHARGE NOTE HOME Autumn Conley to be discharged Home per MD order. Discussed prescriptions and follow up appointments with the patient. Prescriptions given to patient; medication list explained in detail. Patient verbalized understanding.  Skin clean, dry and intact without evidence of skin break down, no evidence of skin tears noted. IV catheter discontinued intact. Site without signs and symptoms of complications. Dressing and pressure applied. Pt denies pain at the site currently. No complaints noted.  Patient free of lines, drains, and wounds.   An After Visit Summary (AVS) was printed and given to the patient. Patient escorted via wheelchair, and discharged home via private auto.  [name], RN

## 2024-09-12 NOTE — Evaluation (Signed)
 Occupational Therapy Evaluation Patient Details Name: Autumn Conley MRN: [id number] DOB: [birth year] Today's Date: 09/12/2024   History of Present Illness   Pt is a 78 y/o F admitted on 09/10/24 after presenting with c/o fatigue, DOE, intermittent epistaxis x 2 days. Pt found to have Hgb 5.9, small to moderate L pleural effusion, small R pleural effusion. PMH: malignant thymoma on chemo, presumed ILD with chronic hypoxic respiratory failure on home O2     Clinical Impressions Patient admitted for the diagnosis above.  PTA she lives at home, with assist as needed from daughter and son.  Patient continued to participate with her own self care, and did mobilize household distances.  Majority of session surrounded education regarding energy conservation at home given how quickly she desaturates, even with supplemental O2.  EC/WS HO given, reviewed, and all questions answered.  Patient is actually scheduled to return home this date, but if she remains, OT will continue efforts in the acute setting.  HH OT has been ordered.        If plan is discharge home, recommend the following:   A little help with walking and/or transfers;A little help with bathing/dressing/bathroom;Assist for transportation;Assistance with cooking/housework     Functional Status Assessment   Patient has had a recent decline in their functional status and demonstrates the ability to make significant improvements in function in a reasonable and predictable amount of time.     Equipment Recommendations   None recommended by OT     Recommendations for Other Services         Precautions/Restrictions   Precautions Precautions: Fall Recall of Precautions/Restrictions: Intact Precaution/Restrictions Comments: O2 sats Restrictions Weight Bearing Restrictions Per Provider Order: No                                                    Balance Overall balance assessment: Needs  assistance Sitting-balance support: Feet supported Sitting balance-Leahy Scale: Good     Standing balance support: Single extremity supported Standing balance-Leahy Scale: Poor                             ADL either performed or assessed with clinical judgement   ADL       Grooming: Supervision/safety;Sitting               Lower Body Dressing: Minimal assistance;Sit to/from stand   Toilet Transfer: Supervision/safety;Contact guard assist                   Vision Patient Visual Report: No change from baseline       Perception Perception: Not tested       Praxis Praxis: Not tested       Pertinent Vitals/Pain Pain Assessment Pain Assessment: No/denies pain     Extremity/Trunk Assessment Upper Extremity Assessment Upper Extremity Assessment: Overall WFL for tasks assessed   Lower Extremity Assessment Lower Extremity Assessment: Defer to PT evaluation   Cervical / Trunk Assessment Cervical / Trunk Assessment: Normal   Communication Communication Communication: Impaired Factors Affecting Communication: Hearing impaired   Cognition Arousal: Alert Behavior During Therapy: WFL for tasks assessed/performed Cognition: No apparent impairments  Following commands: Intact       Cueing  General Comments   Cueing Techniques: Verbal cues  pt on 3L/min throughout session, SpO2 as low as 68% but recovers within ~30 seconds to >/= 90% with cuing re: pursed lip breathing, seated rest   Exercises     Shoulder Instructions      Home Living Family/patient expects to be discharged to:: Private residence Living Arrangements: Spouse/significant other;Children Available Help at Discharge: Family;Available 24 hours/day Type of Home: House Home Access: Ramped entrance     Home Layout: One level     Bathroom Shower/Tub: Producer, Television/film/video: Handicapped height Bathroom Accessibility:  No   Home Equipment: Rollator (4 wheels);BSC/3in1;Shower seat;Wheelchair - manual                         Mobility Comments: Minimal ambulation 2/2 increased O2 needs, ambulates with rollator or furniture walking & supervision from family during mobility, denies falls in the past 6 months but daughter reports pt's balance is poor ADLs Comments: spouse assists with washing pt's back, pt dresses herself    OT Problem List: Decreased activity tolerance   OT Treatment/Interventions: Self-care/ADL training;Therapeutic activities;Energy conservation;DME and/or AE instruction;Balance training      OT Goals(Current goals can be found in the care plan section)   Acute Rehab OT Goals Patient Stated Goal: Return home OT Goal Formulation: With patient Time For Goal Achievement: 09/26/24 Potential to Achieve Goals: Good ADL Goals Pt Will Perform Grooming: with modified independence;standing Pt Will Perform Lower Body Dressing: with modified independence;sit to/from stand Pt Will Transfer to Toilet: with modified independence;ambulating;regular height toilet   OT Frequency:  Min 2X/week    Co-evaluation              AM-PAC OT 6 Clicks Daily Activity     Outcome Measure Help from another person eating meals?: None Help from another person taking care of personal grooming?: None Help from another person toileting, which includes using toliet, bedpan, or urinal?: A Little Help from another person bathing (including washing, rinsing, drying)?: A Little Help from another person to put on and taking off regular upper body clothing?: None Help from another person to put on and taking off regular lower body clothing?: A Little 6 Click Score: 21   End of Session Nurse Communication: Mobility status  Activity Tolerance: Patient tolerated treatment well Patient left: in bed;with call bell/phone within reach;with family/visitor present  OT Visit Diagnosis: Unsteadiness on feet  (R26.81)                Time: 1200-1220 OT Time Calculation (min): 20 min Charges:  OT General Charges $OT Visit: 1 Visit OT Evaluation $OT Eval Moderate Complexity: 1 Mod  09/12/2024  RP, OTR/L  Acute Rehabilitation Services  Office:  [phone]   Charlie JONETTA Halsted 09/12/2024, 12:53 PM

## 2024-09-12 NOTE — Plan of Care (Signed)
  Problem: Education: Goal: Knowledge of General Education information will improve Description: Including pain rating scale, medication(s)/side effects and non-pharmacologic comfort measures Outcome: Adequate for Discharge   Problem: Health Behavior/Discharge Planning: Goal: Ability to manage health-related needs will improve Outcome: Adequate for Discharge   Problem: Clinical Measurements: Goal: Ability to maintain clinical measurements within normal limits will improve Outcome: Adequate for Discharge Goal: Will remain free from infection Outcome: Adequate for Discharge Goal: Diagnostic test results will improve Outcome: Adequate for Discharge Goal: Respiratory complications will improve Outcome: Adequate for Discharge Goal: Cardiovascular complication will be avoided Outcome: Adequate for Discharge   Problem: Activity: Goal: Risk for activity intolerance will decrease Outcome: Adequate for Discharge   Problem: Nutrition: Goal: Adequate nutrition will be maintained Outcome: Adequate for Discharge   Problem: Coping: Goal: Level of anxiety will decrease Outcome: Adequate for Discharge   Problem: Elimination: Goal: Will not experience complications related to bowel motility Outcome: Adequate for Discharge Goal: Will not experience complications related to urinary retention Outcome: Adequate for Discharge   Problem: Pain Managment: Goal: General experience of comfort will improve and/or be controlled Outcome: Adequate for Discharge   Problem: Safety: Goal: Ability to remain free from injury will improve Outcome: Adequate for Discharge   Problem: Skin Integrity: Goal: Risk for impaired skin integrity will decrease Outcome: Adequate for Discharge   Problem: Acute Rehab PT Goals(only PT should resolve) Goal: Pt Will Transfer Bed To Chair/Chair To Bed Outcome: Adequate for Discharge Goal: Pt Will Ambulate Outcome: Adequate for Discharge Goal: Pt/caregiver will  Perform Home Exercise Program Outcome: Adequate for Discharge   Problem: Acute Rehab OT Goals (only OT should resolve) Goal: Pt. Will Perform Grooming Outcome: Adequate for Discharge Goal: Pt. Will Perform Lower Body Dressing Outcome: Adequate for Discharge Goal: Pt. Will Transfer To Toilet Outcome: Adequate for Discharge

## 2024-09-12 NOTE — Discharge Summary (Signed)
 PATIENT DETAILS Name: Autumn Conley Age: 78 y.o. Sex: female Date of Birth: 09/06/1946 MRN: [id number]. Admitting Physician: Marsa KATHEE Scurry, MD ERE:Ndanmwz, Tari DASEN, PA-C  Admit Date: 09/10/2024 Discharge date: 09/12/2024  Recommendations for Outpatient Follow-up:  Follow up with PCP in 1-2 weeks Please obtain CMP/CBC in one week  Admitted From:  Home  Disposition: Home health   Discharge Condition: good  CODE STATUS:   Code Status: Full Code   Diet recommendation:  Diet Order             Diet - low sodium heart healthy           Diet regular Room service appropriate? Yes; Fluid consistency: Thin  Diet effective now                    Brief Summary: Patient is a 78 y.o.  female with history of malignant thymoma on chemotherapy (last received 11/11 at [facility])-presumed ILD with chronic hypoxic respiratory failure on home O2-presented to the hospital with fatigue/exertional dyspnea and intermittent epistaxis x 2 days-found to have Hb of 5.9.   Significant events: 11/22>> admit to [facility].   Significant studies: CXR: Increased airspace disease-mid/right lower lung (chronic issue).  Small to moderate left pleural effusion, small right pleural effusion.   Significant microbiology data: None   Procedures: None   Consults: None   Brief Hospital Course: Epistaxis Probably secondary to home O2 use/dry nose/trauma from nasal cannula. Resolved-no epistaxis x 48 hours  Humidifier arranged by TOC Previously-was on 6 L of oxygen  at rest-probably flow was contributing to some mucosal trauma-does not need 6 L at rest-has been stable on just 1 L at rest.  See below.   Multifactorial anemia Due to combination of anemia related to chronic disease/recent chemo-in some acute blood loss related to epistaxis Hb stable after 2 units of PRBC Was supposed to get IV iron  as an outpatient today-Will be given prior to discharge. Follow CBC closely in the outpatient  setting   Thrombocytopenia Continues to have thrombocytopenia-platelet count down to 34K today Reached out to patient's primary oncologist-Dr. Lysbeth at [facility]-spoke to him over the phone-since no epistaxis x 48 hours-no further intervention needed-his office will arrange for a quick follow-up later this week to see where her platelet counts are.  Patient/family aware that his office will call.     Leukopenia Likely in the setting of chemotherapy See above-plans are to repeat CBC later this week.   History of malignant thymoma Getting [place] infusions at [facility]-last on 11/11. Maintained on folic acid /vitamin B12 (gets monthly vitamin B12 injections).   History of ILD Chronic hypoxic respiratory failure (requiring up to 6 L of oxygen  at home) Has been titrated down to 1 L at rest-probably requires more 4-5 with activity.  Worsening hypoxemia with activity is likely related to ILD/radiation fibrosis/deconditioning/anemia/thymoma.    Possible Sjogren syndrome Has had outpatient workup-apparently has been referred to rheumatology Continue with Decadron  in the meantime.   History of NASH History of breast cancer Supportive care   HTN BP stable Metoprolol  Amlodipine on hold   Chronic HFpEF Euvolemic   HLD Statin   GERD PPI   OSA CPAP nightly   Anxiety/depression Continue Paxil /Xanax .   Debility/deconditioning PT/OT eval-Home health recommended.   Discharge Diagnoses:  Principal Problem:   Acute on chronic blood loss anemia Active Problems:   HTN (hypertension)   GERD (gastroesophageal reflux disease)   Migraine without aura and without status  migrainosus, not intractable   Anxiety   Depression   Fibromyalgia   OSA (obstructive sleep apnea)   Dyslipidemia   Thymoma -- managed by [facility] - extensive documentation in Care Everywhere   Glaucoma of both eyes   NASH (nonalcoholic steatohepatitis)   CHF (congestive heart failure)  (HCC)   Pericardial effusion   Discharge Instructions:  Activity:  As tolerated with Full fall precautions use walker/cane & assistance as needed   Discharge Instructions     Diet - low sodium heart healthy   Complete by: As directed    Discharge instructions   Complete by: As directed    Follow with Primary MD  Tammy Tari DASEN, PA-C in 1-2 weeks  Your oncologist office will call you with a follow-up appointment this week for repeat CBC.  While your platelet count is low-please avoid aspirin , nonsteroidal anti-inflammatory medications (Advil/Aleve/ibuprofen)-if you have mild aches/pains-take Tylenol .  Please get a complete blood count and chemistry panel checked by your Primary MD at your next visit, and again as instructed by your Primary MD.  Get Medicines reviewed and adjusted: Please take all your medications with you for your next visit with your Primary MD  Laboratory/radiological data: Please request your Primary MD to go over all hospital tests and procedure/radiological results at the follow up, please ask your Primary MD to get all Hospital records sent to his/her office.  In some cases, they will be blood work, cultures and biopsy results pending at the time of your discharge. Please request that your primary care M.D. follows up on these results.  Also Note the following: If you experience worsening of your admission symptoms, develop shortness of breath, life threatening emergency, suicidal or homicidal thoughts you must seek medical attention immediately by calling 911 or calling your MD immediately  if symptoms less severe.  You must read complete instructions/literature along with all the possible adverse reactions/side effects for all the Medicines you take and that have been prescribed to you. Take any new Medicines after you have completely understood and accpet all the possible adverse reactions/side effects.   Do not drive when taking Pain medications or  sleeping medications (Benzodaizepines)  Do not take more than prescribed Pain, Sleep and Anxiety Medications. It is not advisable to combine anxiety,sleep and pain medications without talking with your primary care practitioner  Special Instructions: If you have smoked or chewed Tobacco  in the last 2 yrs please stop smoking, stop any regular Alcohol   and or any Recreational drug use.  Wear Seat belts while driving.  Please note: You were cared for by a hospitalist during your hospital stay. Once you are discharged, your primary care physician will handle any further medical issues. Please note that NO REFILLS for any discharge medications will be authorized once you are discharged, as it is imperative that you return to your primary care physician (or establish a relationship with a primary care physician if you do not have one) for your post hospital discharge needs so that they can reassess your need for medications and monitor your lab values.   Increase activity slowly   Complete by: As directed       Allergies as of 09/12/2024       Reactions   Latex Rash, Other (See Comments)   Blisters, also        Medication List     TAKE these medications    acetaminophen  650 MG CR tablet Commonly known as: TYLENOL  Take 650 mg by mouth  every 8 (eight) hours as needed for pain.   albuterol  108 (90 Base) MCG/ACT inhaler Commonly known as: VENTOLIN  HFA Inhale 1-2 puffs into the lungs every 6 (six) hours as needed for wheezing or shortness of breath.   ALPRAZolam  0.25 MG tablet Commonly known as: XANAX  Take 0.5 tablets (0.125 mg total) by mouth daily as needed for anxiety.   amLODipine 5 MG tablet Commonly known as: NORVASC Take 5 mg by mouth daily.   Artificial Tears ophthalmic solution Place 1 drop into both eyes 4 (four) times daily as needed (DRY EYES). EVOH TEARS   atorvastatin  40 MG tablet Commonly known as: LIPITOR TAKE 1 TABLET BY MOUTH AT  BEDTIME   B-D 3CC LUER-LOK  SYR 25GX1 25G X 1 3 ML Misc Generic drug: SYRINGE-NEEDLE (DISP) 3 ML USE TO INJECT  CYANOCOBALAMIN  ONCE MONTHLY   BENADRYL  PO Take 1 tablet by mouth as needed.   butalbital -acetaminophen -caffeine  50-325-40 MG tablet Commonly known as: FIORICET  TAKE 1 TO 2 TABLETS BY MOUTH EVERY 6 HOURS AS NEEDED FOR HEADACHES(S). MANUFACTURER RECOMMENDS NOT EXCEEDING 6 TABLETS PER DAY.   dexamethasone  1 MG tablet Commonly known as: DECADRON  Take 1 mg by mouth daily.   docusate sodium  100 MG capsule Commonly known as: COLACE Take 100 mg by mouth 2 (two) times daily as needed for mild constipation or moderate constipation.   folic acid  1 MG tablet Commonly known as: FOLVITE  Take 1 mg by mouth daily.   metoprolol  succinate 25 MG 24 hr tablet Commonly known as: Toprol  XL Take 1/2 tablet (12.5mg ) daily for 1 week. Take 1 tablet (25mg ) daily after. What changed:  how much to take how to take this when to take this additional instructions   mupirocin  ointment 2 % Commonly known as: BACTROBAN  Apply to nasal area 1-2 times daily   pantoprazole  40 MG tablet Commonly known as: PROTONIX  Take 1 tablet (40 mg total) by mouth daily before breakfast.   PARoxetine  20 MG tablet Commonly known as: PAXIL  TAKE 3 TABLETS BY MOUTH IN THE  MORNING   polyethylene glycol powder 17 GM/SCOOP powder Commonly known as: GLYCOLAX /MIRALAX  Take 17 g by mouth daily as needed for mild constipation.   SYSTANE OP Apply 1 drop to eye as needed. about 4-5 times daily   valACYclovir  500 MG tablet Commonly known as: VALTREX  TAKE 1 TABLET BY MOUTH DAILY   VITAMIN D -3 PO Take 1 capsule by mouth daily.   vitamin E  180 MG (400 UNITS) capsule Take 400 Units by mouth daily.               Durable Medical Equipment  (From admission, onward)           Start     Ordered   09/12/24 1109  For home use only DME Other see comment  Once       Comments: Add humidity to home oxygen  and add enrichment to home  oxygen   Question:  Length of Need  Answer:  Lifetime   09/12/24 1109            Contact information for after-discharge care     Home Medical Care     [facility]) .   Service: Home Health Services Contact information: [address] [place]  [address] [phone]                    Allergies  Allergen Reactions   Latex Rash and Other (See Comments)  Blisters, also     Other Procedures/Studies: DG Chest 2 View Result Date: 09/10/2024 CLINICAL DATA:  Shortness of breath. History of interstitial lung disease and thymoma. EXAM: CHEST - 2 VIEW COMPARISON:  09/02/2024. FINDINGS: The heart size and mediastinal contours are stable. Increased airspace disease is noted in the mid to lower right lung field. A small right pleural effusion is noted. There is a small to moderate left pleural effusion with atelectasis or infiltrate. No pneumothorax is seen. Surgical clips are noted in the left chest wall. A right chest port appear stable. No acute osseous abnormality. IMPRESSION: 1. Increased airspace disease in the mid to lower right lung. 2. Small to moderate left pleural effusion with atelectasis or infiltrate. 3. Small right pleural effusion. Electronically Signed   By: Leita Birmingham M.D.   On: 09/10/2024 14:05   US  THORACENTESIS ASP PLEURAL SPACE W/IMG GUIDE Result Date: 09/02/2024 INDICATION: Left pleural effusion Patient with recurrent left pleural effusion, h/o breast cancer. Has previously undergone left thoracentesis on 07/04/24 with 500 mL of serosanguinous fluid removed. EXAM: ULTRASOUND GUIDED LEFT THORACENTESIS MEDICATIONS: 10 mL 1% lidocaine  COMPLICATIONS: None immediate. PROCEDURE: An ultrasound guided thoracentesis was thoroughly discussed with the patient and questions answered. The benefits, risks, alternatives and complications were also discussed. The patient understands and wishes to proceed with the procedure.  Written consent was obtained. Ultrasound was performed to localize and mark an adequate pocket of fluid in the left chest, though there is notably a loculated appearance to the fluid. This is consistent with recent CT findings 06/28/24. The area was then prepped and draped in the normal sterile fashion. 1% Lidocaine  was used for local anesthesia. Under ultrasound guidance a 6 Fr Safe-T-Centesis catheter was introduced. Thoracentesis was performed. The catheter was removed and a dressing applied. FINDINGS: A total of approximately 300 mL of serosanguinous fluid was removed. Samples were sent to the laboratory as requested by the clinical team. IMPRESSION: Successful ultrasound guided LEFT thoracentesis yielding 300 mL of pleural fluid. Performed by Laymon Coast, NP under the supervision of Dr. Hughes Electronically Signed   By: Thom Hughes M.D.   On: 09/02/2024 11:51   DG Chest Port 1 View Result Date: 09/02/2024 CLINICAL DATA:  Status post thoracentesis. EXAM: PORTABLE CHEST 1 VIEW COMPARISON:  07/04/2024 FINDINGS: Left pleural effusion evident. Tiny right pleural effusion. No evidence for pleural gas to suggest pneumothorax. Bibasilar collapse/consolidation is similar to prior. Right Port-A-Cath again noted. The cardio pericardial silhouette is enlarged. No acute bony abnormality. IMPRESSION: 1. No evidence for pneumothorax after thoracentesis. 2. Persistent bibasilar collapse/consolidation with left greater than right pleural effusions. Electronically Signed   By: Camellia Candle M.D.   On: 09/02/2024 11:38     TODAY-DAY OF DISCHARGE:  Subjective:   Autumn Conley today has no headache,no chest abdominal pain,no new weakness tingling or numbness, feels much better wants to go home today.   Objective:   Blood pressure 117/75, pulse 85, temperature 97.8 F (36.6 C), temperature source Oral, resp. rate 20, height 5' 3 (1.6 m), weight 62.9 kg, SpO2 99%.  Intake/Output Summary (Last 24 hours) at  09/12/2024 1126 Last data filed at 09/11/2024 1249 Gross per 24 hour  Intake 120 ml  Output --  Net 120 ml   Filed Weights   09/10/24 1209 09/10/24 1652  Weight: 60.8 kg 62.9 kg    Exam: Awake Alert, Oriented *3, No new F.N deficits, Normal affect [place].AT,PERRAL Supple Neck,No JVD, No cervical lymphadenopathy appriciated.  Symmetrical Chest wall movement,  Good air movement bilaterally, CTAB RRR,No Gallops,Rubs or new Murmurs, No Parasternal Heave +ve B.Sounds, Abd Soft, Non tender, No organomegaly appriciated, No rebound -guarding or rigidity. No Cyanosis, Clubbing or edema, No new Rash or bruise   PERTINENT RADIOLOGIC STUDIES: DG Chest 2 View Result Date: 09/10/2024 CLINICAL DATA:  Shortness of breath. History of interstitial lung disease and thymoma. EXAM: CHEST - 2 VIEW COMPARISON:  09/02/2024. FINDINGS: The heart size and mediastinal contours are stable. Increased airspace disease is noted in the mid to lower right lung field. A small right pleural effusion is noted. There is a small to moderate left pleural effusion with atelectasis or infiltrate. No pneumothorax is seen. Surgical clips are noted in the left chest wall. A right chest port appear stable. No acute osseous abnormality. IMPRESSION: 1. Increased airspace disease in the mid to lower right lung. 2. Small to moderate left pleural effusion with atelectasis or infiltrate. 3. Small right pleural effusion. Electronically Signed   By: Leita Birmingham M.D.   On: 09/10/2024 14:05     PERTINENT LAB RESULTS: CBC: Recent Labs    09/11/24 0402 09/12/24 0337  WBC 2.1* 2.2*  HGB 8.8* 7.8*  HCT 25.6* 22.9*  PLT 39* 34*   CMET CMP     Component Value Date/Time   NA 143 09/11/2024 0402   NA 137 04/11/2024 1012   K 4.4 09/11/2024 0402   CL 103 09/11/2024 0402   CO2 31 09/11/2024 0402   GLUCOSE 107 (H) 09/11/2024 0402   BUN 28 (H) 09/11/2024 0402   BUN 35 (H) 04/11/2024 1012   CREATININE 1.08 (H) 09/11/2024 0402    CREATININE 0.88 08/29/2020 0951   CALCIUM  9.1 09/11/2024 0402   PROT 6.6 09/11/2024 0402   ALBUMIN 2.3 (L) 09/11/2024 0402   AST 28 09/11/2024 0402   AST 20 03/07/2019 1034   ALT 27 09/11/2024 0402   ALT 23 03/07/2019 1034   ALKPHOS 99 09/11/2024 0402   BILITOT 0.7 09/11/2024 0402   BILITOT 0.3 03/07/2019 1034   GFR 66.15 05/01/2020 1405   EGFR 37 (L) 04/11/2024 1012   GFRNONAA 52 (L) 09/11/2024 0402   GFRNONAA >60 03/07/2019 1034    GFR Estimated Creatinine Clearance: 37.7 mL/min (A) (by C-G formula based on SCr of 1.08 mg/dL (H)). No results for input(s): LIPASE, AMYLASE in the last 72 hours. No results for input(s): CKTOTAL, CKMB, CKMBINDEX, TROPONINI in the last 72 hours. Invalid input(s): POCBNP No results for input(s): DDIMER in the last 72 hours. No results for input(s): HGBA1C in the last 72 hours. No results for input(s): CHOL, HDL, LDLCALC, TRIG, CHOLHDL, LDLDIRECT in the last 72 hours. No results for input(s): TSH, T4TOTAL, T3FREE, THYROIDAB in the last 72 hours.  Invalid input(s): FREET3 No results for input(s): VITAMINB12, FOLATE, FERRITIN, TIBC, IRON , RETICCTPCT in the last 72 hours. Coags: Recent Labs    09/10/24 1307  INR 1.1   Microbiology: No results found for this or any previous visit (from the past 240 hours).  FURTHER DISCHARGE INSTRUCTIONS:  Get Medicines reviewed and adjusted: Please take all your medications with you for your next visit with your Primary MD  Laboratory/radiological data: Please request your Primary MD to go over all hospital tests and procedure/radiological results at the follow up, please ask your Primary MD to get all Hospital records sent to his/her office.  In some cases, they will be blood work, cultures and biopsy results pending at the time of your discharge. Please request that your primary  care M.D. goes through all the records of your hospital data and follows up on  these results.  Also Note the following: If you experience worsening of your admission symptoms, develop shortness of breath, life threatening emergency, suicidal or homicidal thoughts you must seek medical attention immediately by calling 911 or calling your MD immediately  if symptoms less severe.  You must read complete instructions/literature along with all the possible adverse reactions/side effects for all the Medicines you take and that have been prescribed to you. Take any new Medicines after you have completely understood and accpet all the possible adverse reactions/side effects.   Do not drive when taking Pain medications or sleeping medications (Benzodaizepines)  Do not take more than prescribed Pain, Sleep and Anxiety Medications. It is not advisable to combine anxiety,sleep and pain medications without talking with your primary care practitioner  Special Instructions: If you have smoked or chewed Tobacco  in the last 2 yrs please stop smoking, stop any regular Alcohol   and or any Recreational drug use.  Wear Seat belts while driving.  Please note: You were cared for by a hospitalist during your hospital stay. Once you are discharged, your primary care physician will handle any further medical issues. Please note that NO REFILLS for any discharge medications will be authorized once you are discharged, as it is imperative that you return to your primary care physician (or establish a relationship with a primary care physician if you do not have one) for your post hospital discharge needs so that they can reassess your need for medications and monitor your lab values.  Total Time spent coordinating discharge including counseling, education and face to face time equals greater than 30 minutes.  SignedBETHA Donalda Applebaum 09/12/2024 11:26 AM

## 2024-09-12 NOTE — Plan of Care (Signed)
 ?  Problem: Education: ?Goal: Knowledge of General Education information will improve ?Description: Including pain rating scale, medication(s)/side effects and non-pharmacologic comfort measures ?Outcome: Progressing ?  ?Problem: Health Behavior/Discharge Planning: ?Goal: Ability to manage health-related needs will improve ?Outcome: Progressing ?  ?Problem: Coping: ?Goal: Level of anxiety will decrease ?Outcome: Progressing ?  ?

## 2024-09-12 NOTE — TOC Transition Note (Signed)
 Transition of Care [facility]) - Discharge Note   Patient Details  Name: Autumn Conley MRN: [id number] Date of Birth: Mar 22, 1946  [facility] CM/SW Contact:  Landry DELENA Senters, RN Phone Number: 09/12/2024, 11:10 AM   Clinical Narrative:     Patient discharging to home with husband today. Patient's daughter and/or husband will be providing transportation home. Husband does report oxygen  to travel home is ready and in their car. HH arranged via [facility] for therapy, info on AVS. [employer] contacted to add humidity to oxygen  and enrichment to be added for CPAP/oxygen  compatibility.  No other needs identified by CM.   Final next level of care: Home w Home Health Services Barriers to Discharge: No Barriers Identified   Patient Goals and CMS Choice   CMS Medicare.gov Compare Post Acute Care list provided to:: Patient Choice offered to / list presented to : Patient      Discharge Placement                       Discharge Plan and Services Additional resources added to the After Visit Summary for                            [facility] Arranged: PT, OT [facility] Agency: [facility] Date [facility] Agency Contacted: 09/12/24 Time HH Agency Contacted: 1110 Representative spoke with at [facility] Agency: Joane  Social Drivers of Health (SDOH) Interventions SDOH Screenings   Food Insecurity: No Food Insecurity (09/10/2024)  Housing: Low Risk  (09/10/2024)  Transportation Needs: No Transportation Needs (09/10/2024)  Utilities: Not At Risk (09/10/2024)  Depression (PHQ2-9): Low Risk  (09/17/2021)  Financial Resource Strain: Low Risk  (09/17/2021)  Physical Activity: Inactive (09/17/2021)  Social Connections: Socially Integrated (09/11/2024)  Stress: No Stress Concern Present (09/17/2021)  Tobacco Use: Low Risk  (09/10/2024)     Readmission Risk Interventions    12/28/2023    2:00 PM  Readmission Risk Prevention Plan  Transportation Screening Complete  PCP or Specialist Appt within  5-7 Days Not Complete  Not Complete comments apt 3/21  Home Care Screening Complete  Medication Review (RN CM) Complete

## 2024-09-12 NOTE — Evaluation (Signed)
 Physical Therapy Evaluation Patient Details Name: Autumn Conley MRN: [id number] DOB: December 24, 1945 Today's Date: 09/12/2024  History of Present Illness  Pt is a 78 y/o F admitted on 09/10/24 after presenting with c/o fatigue, DOE, intermittent epistaxis x 2 days. Pt found to have Hgb 5.9, small to moderate L pleural effusion, small R pleural effusion. PMH: malignant thymoma on chemo, presumed ILD with chronic hypoxic respiratory failure on home O2  Clinical Impression  Pt seen for PT evaluation with pt agreeable, daughter Clabe) present for session. Prior to admission pt was living with spouse in 1 level home, ambulating short household distances & using w/c for community mobility; pt primarily limited by SpO2 needs. On this date, pt is able to complete bed mobility with mod I, sit>stand with CGA & bed>recliner with min assist. Pt requires seated rest break between each transitional movement 2/2 c/o SOB, increased O2 needs. Recommend ongoing PT services to progress mobility as able.      If plan is discharge home, recommend the following: A little help with walking and/or transfers;A little help with bathing/dressing/bathroom;Assistance with cooking/housework;Assist for transportation;Help with stairs or ramp for entrance   Can travel by private vehicle        Equipment Recommendations None recommended by PT (pt has all DME needs)  Recommendations for Other Services       Functional Status Assessment Patient has had a recent decline in their functional status and demonstrates the ability to make significant improvements in function in a reasonable and predictable amount of time.     Precautions / Restrictions Precautions Precautions: Fall Restrictions Weight Bearing Restrictions Per Provider Order: No      Mobility  Bed Mobility Overal bed mobility: Modified Independent Bed Mobility: Supine to Sit     Supine to sit: Modified independent (Device/Increase time), HOB elevated, Used  rails (exit R side of bed)          Transfers Overall transfer level: Needs assistance Equipment used: 1 person hand held assist Transfers: Sit to/from Stand, Bed to chair/wheelchair/BSC Sit to Stand: Contact guard assist   Step pivot transfers: Min assist (bed>recliner on R)            Ambulation/Gait                  Stairs            Wheelchair Mobility     Tilt Bed    Modified Rankin (Stroke Patients Only)       Balance Overall balance assessment: Needs assistance Sitting-balance support: Feet supported Sitting balance-Leahy Scale: Good     Standing balance support: During functional activity, Single extremity supported Standing balance-Leahy Scale: Poor                 High Level Balance Comments: BLE trembling when standing             Pertinent Vitals/Pain Pain Assessment Pain Assessment: No/denies pain    Home Living Family/patient expects to be discharged to:: Private residence Living Arrangements: Spouse/significant other;Children Available Help at Discharge: Family;Available 24 hours/day Type of Home: House Home Access: Ramped entrance       Home Layout: One level Home Equipment: Rollator (4 wheels);BSC/3in1;Shower seat;Wheelchair - manual      Prior Function               Mobility Comments: Minimal ambulation 2/2 increased O2 needs, ambulates with rollator or furniture walking & supervision from family during mobility, denies falls in the past  6 months but daughter reports pt's balance is poor ADLs Comments: spouse assists with washing pt's back, pt dresses herself     Extremity/Trunk Assessment   Upper Extremity Assessment Upper Extremity Assessment: Overall WFL for tasks assessed    Lower Extremity Assessment Lower Extremity Assessment: Generalized weakness    Cervical / Trunk Assessment Cervical / Trunk Assessment: Normal  Communication   Communication Communication: Impaired Factors  Affecting Communication: Hearing impaired    Cognition Arousal: Alert Behavior During Therapy: Anxious (re: SOB)   PT - Cognitive impairments: No apparent impairments                         Following commands: Intact       Cueing Cueing Techniques: Verbal cues     General Comments General comments (skin integrity, edema, etc.): pt on 3L/min throughout session, SpO2 as low as 68% but recovers within ~30 seconds to >/= 90% with cuing re: pursed lip breathing, seated rest    Exercises Other Exercises Other Exercises: Pt performed standing marching in place with min assist. Other Exercises: PT educated pt on energy conservation strategies, monitoring O2 via pulse ox, pursed lip breathing, recommendations of HHPT.   Assessment/Plan    PT Assessment Patient needs continued PT services  PT Problem List Decreased strength;Cardiopulmonary status limiting activity;Decreased activity tolerance;Decreased balance;Decreased mobility;Decreased safety awareness;Decreased knowledge of use of DME       PT Treatment Interventions Balance training;Neuromuscular re-education;Gait training;DME instruction;Stair training;Functional mobility training;Therapeutic exercise;Therapeutic activities;Patient/family education    PT Goals (Current goals can be found in the Care Plan section)  Acute Rehab PT Goals Patient Stated Goal: breathing improves PT Goal Formulation: With patient/family Time For Goal Achievement: 09/26/24 Potential to Achieve Goals: Fair    Frequency Min 2X/week     Co-evaluation               AM-PAC PT 6 Clicks Mobility  Outcome Measure Help needed turning from your back to your side while in a flat bed without using bedrails?: None Help needed moving from lying on your back to sitting on the side of a flat bed without using bedrails?: A Little Help needed moving to and from a bed to a chair (including a wheelchair)?: A Little Help needed standing up from a  chair using your arms (e.g., wheelchair or bedside chair)?: A Little Help needed to walk in hospital room?: A Little Help needed climbing 3-5 steps with a railing? : A Lot 6 Click Score: 18    End of Session Equipment Utilized During Treatment: Oxygen  Activity Tolerance: Other (comment) (limited 2/2 O2 needs) Patient left: in chair;with call bell/phone within reach;with family/visitor present Nurse Communication: Mobility status (O2) PT Visit Diagnosis: Unsteadiness on feet (R26.81);Muscle weakness (generalized) (M62.81);Other abnormalities of gait and mobility (R26.89)    Time: 9071-9044 PT Time Calculation (min) (ACUTE ONLY): 27 min   Charges:   PT Evaluation $PT Eval Low Complexity: 1 Low   PT General Charges $$ ACUTE PT VISIT: 1 Visit         Richerd Pinal, PT, DPT 09/12/24, 10:07 AM   Richerd CHRISTELLA Pinal 09/12/2024, 10:06 AM

## 2024-09-22 ENCOUNTER — Ambulatory Visit: Attending: Pulmonary Disease | Admitting: Pulmonary Disease

## 2024-09-22 ENCOUNTER — Encounter: Payer: Self-pay | Admitting: Pulmonary Disease

## 2024-09-22 VITALS — BP 132/82 | HR 74 | Ht 63.0 in | Wt 132.0 lb

## 2024-09-22 DIAGNOSIS — I350 Nonrheumatic aortic (valve) stenosis: Secondary | ICD-10-CM | POA: Insufficient documentation

## 2024-09-22 DIAGNOSIS — R002 Palpitations: Secondary | ICD-10-CM | POA: Insufficient documentation

## 2024-09-22 DIAGNOSIS — I471 Supraventricular tachycardia, unspecified: Secondary | ICD-10-CM | POA: Insufficient documentation

## 2024-09-22 MED ORDER — METOPROLOL SUCCINATE ER 50 MG PO TB24: 50.0000 mg | ORAL_TABLET | Freq: Every day | ORAL | 3 refills | Status: AC

## 2024-09-22 NOTE — Patient Instructions (Addendum)
 Medication Instructions:   START TAKING:   TOPROL  XL  50 MG ONCE  A  DAY    *If you need a refill on your cardiac medications before your next appointment, please call your pharmacy*    Lab Work: NONE ORDERED  TODAY     If you have labs (blood work) drawn today and your tests are completely normal, you will receive your results only by: MyChart Message (if you have MyChart) OR A paper copy in the mail If you have any lab test that is abnormal or we need to change your treatment, we will call you to review the results.    Testing/Procedures: NONE ORDERED  TODAY     Follow-Up: At [facility], you and your health needs are our priority.  As part of our continuing mission to provide you with exceptional heart care, our providers are all part of one team.  This team includes your primary Cardiologist (physician) and Advanced Practice Providers or APPs (Physician Assistants and Nurse Practitioners) who all work together to provide you with the care you need, when you need it.   Your next appointment:   6 month(s)   Provider:   You may see  Daphne Barrack, NP     We recommend signing up for the patient portal called MyChart.  Sign up information is provided on this After Visit Summary.  MyChart is used to connect with patients for Virtual Visits (Telemedicine).  Patients are able to view lab/test results, encounter notes, upcoming appointments, etc.  Non-urgent messages can be sent to your provider as well.   To learn more about what you can do with MyChart, go to [email or web address].   Other Instructions

## 2024-09-22 NOTE — Progress Notes (Signed)
  Electrophysiology Office Note:   Date:  09/22/2024  ID:  Autumn Conley, DOB [birth year], MRN [id number]  Primary Cardiologist: Soyla DELENA Merck, MD Primary Heart Failure: None Electrophysiologist: Danelle Birmingham, MD      History of Present Illness:   Autumn Conley is a 78 y.o. 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   Admitted from 11/22-11/24/25 for fatigue, exertional dyspnea and intermittent epistasis. Found to have Hgb of 5.9.  Her husband reports she received 4 units PRBC's & iron  while inpatient.   Since last being seen in our clinic the patient reports she is feeling better after her hospital admit. She was seen yesterday at [facility] and her Hgb was 8.6.  She received an iron  infusion.  She denies known bleeding.  Reports she continues to have episodes of palpitations that are brief in nature.  By the time she gets her pulse ox to check her HR, the symptoms are gone. She states she continues to have episodes but they are not as frequent as they were in the past.   She denies chest pain, palpitations, dyspnea, PND, orthopnea, nausea, vomiting, dizziness, syncope, edema, weight gain, or early satiety.   Review of systems complete and found to be negative unless listed in HPI.   EP Information / Studies Reviewed:    EKG is not ordered today. EKG from 09/10/24 reviewed which showed SR 100 bpm       Arrhythmia / AAD / Pertinent EP Studies SVT / AVNRT  EPS 07/29/23 > dual AV nodal physiology with easily inducible atypical AV nodal reentrant tachycardia, RF ablation of slow AV nodal pathway    Risk Assessment/Calculations:              Physical Exam:   VS:  BP 132/82   Pulse 74   Ht 5' 3 (1.6 m)   Wt 132 lb (59.9 kg)   SpO2 97%   BMI 23.38 kg/m    Wt Readings from  Last 3 Encounters:  09/22/24 132 lb (59.9 kg)  09/10/24 138 lb 10.7 oz (62.9 kg)  09/01/24 143 lb (64.9 kg)     GEN: Well nourished, well developed in no acute distress NECK: No JVD; No carotid bruits CARDIAC: Regular rate and rhythm, 2-3/6 SEM, rubs, gallops RESPIRATORY:  diminished bilaterally without rales, wheezing or rhonchi, 6L O2 ABDOMEN: Soft, non-tender, non-distended EXTREMITIES:  No edema; No deformity   ASSESSMENT AND PLAN:    SVT / AVNRT  Palpitations S/p slow pathway ablation 07/2023  -increase Toprol  to 50 mg daily for palpitations  -instructed patient to keep her 25 mg Toprol  tablets and if she has lightheadedness / dizziness or low BP to hold the 50 mg and restart the 25mg  and call to let me know  VHD: AS  Diastolic Dysfunction  -euvolemic on exam     Dyspnea  Restrictive Lung Disease  -follows with Pulmonary  -on 6L [place]  Malignant Thymoma  -follows at [facility]   Follow up with EP APP in 6 months > transition to Dr. Almetta, new EP MD discussed with patient   Signed, Daphne Barrack, NP-C, AGACNP-BC [facility] - Electrophysiology  09/22/2024, 11:00 AM

## 2024-11-16 ENCOUNTER — Ambulatory Visit: Payer: Self-pay | Admitting: Pulmonary Disease

## 2024-11-16 NOTE — Telephone Encounter (Signed)
 Called and got patient scheduled to see Dr,Mannam tomorrow,advised er,patient refused,accapted appointment,advised patient if things do get worse go to the emergency room.

## 2024-11-16 NOTE — Telephone Encounter (Signed)
 Pt speaking in 2-3 word dyspnea, EMS advised. Pt refused, stating Dr. Theophilus is aware of her condition and has advised her to come in to take X rays and take the fluid off. Please call pt back and advise.   (Pulm [facility] called, no answer.)  FYI Only or Action Required?: FYI only for provider: ED advised.  Patient was last seen in primary care on 07/23/2022 by Job Lukes, PA.  Called Nurse Triage reporting Shortness of Breath.  Symptoms began several days ago.  Symptoms are: rapidly worsening.  Triage Disposition: Call EMS 911 Now  Patient/caregiver understands and will follow disposition?: No, wishes to speak with PCP      Reason for Triage: Patient said she has fluid on her lungs and SOB. Warm transfer to [facility]  Reason for Disposition  SEVERE difficulty breathing (e.g., struggling for each breath, speaks in single words)  Answer Assessment - Initial Assessment Questions 1. RESPIRATORY STATUS: Describe your breathing? (e.g., wheezing, shortness of breath, unable to speak, severe coughing)      SOB, she looks pretty rough per [place] husband  2. ONSET: When did this breathing problem begin?      2-3 days   4. SEVERITY: How bad is your breathing? (e.g., mild, moderate, severe)      2-3 word dyspnea  5. RECURRENT SYMPTOM: Have you had difficulty breathing before? If Yes, ask: When was the last time? and What happened that time?      Yes  6. CANCER: What type of cancer do you have?      Thymoma  8. CANCER - NEUTROPENIA RISK: Were you told that your white cell count is low? Have you received anti-cancer therapy (e.g., chemo, CAR-T) recently? If Yes, triager with access to patient's medical record should review most recent labs. An ANC less than 1,500 means that the neutrophils are low and the immune system is weak.     Her oncologist has advised her to stay away from other people per [place] the husband  Protocols used: Cancer - Breathing  Difficulty-A-AH

## 2024-11-16 NOTE — Telephone Encounter (Signed)
 Patient triaged earlier, declined ED visit. Was awaiting call back from office for appt today and hasn't heard back. Note reviewed, no response from provider. No changes in status. Attempted to call [facility] with no answer. Advised would send another message. If pt worsens go to ED. Husband states they probably wouldn't do that.    Delores Dedra SAUNDERS   11/16/2024 12:39 PM  Type: Nurse Triage Message  Reason for Triage: Patient said she has fluid on her lungs and SOB. Warm transfer to NT

## 2024-11-17 ENCOUNTER — Encounter: Payer: Self-pay | Admitting: Pulmonary Disease

## 2024-11-17 ENCOUNTER — Ambulatory Visit: Admitting: Pulmonary Disease

## 2024-11-17 ENCOUNTER — Ambulatory Visit

## 2024-11-17 ENCOUNTER — Other Ambulatory Visit: Payer: Self-pay

## 2024-11-17 VITALS — BP 113/67 | HR 69 | Temp 97.4°F | Ht 63.0 in | Wt 128.0 lb

## 2024-11-17 DIAGNOSIS — J849 Interstitial pulmonary disease, unspecified: Secondary | ICD-10-CM

## 2024-11-17 DIAGNOSIS — R0602 Shortness of breath: Secondary | ICD-10-CM | POA: Diagnosis not present

## 2024-11-17 DIAGNOSIS — J9611 Chronic respiratory failure with hypoxia: Secondary | ICD-10-CM | POA: Diagnosis not present

## 2024-11-17 DIAGNOSIS — J984 Other disorders of lung: Secondary | ICD-10-CM

## 2024-11-17 DIAGNOSIS — M3500 Sicca syndrome, unspecified: Secondary | ICD-10-CM

## 2024-11-17 DIAGNOSIS — J701 Chronic and other pulmonary manifestations due to radiation: Secondary | ICD-10-CM

## 2024-11-17 DIAGNOSIS — G4733 Obstructive sleep apnea (adult) (pediatric): Secondary | ICD-10-CM

## 2024-11-17 DIAGNOSIS — R0600 Dyspnea, unspecified: Secondary | ICD-10-CM

## 2024-11-17 DIAGNOSIS — J90 Pleural effusion, not elsewhere classified: Secondary | ICD-10-CM | POA: Diagnosis not present

## 2024-11-17 DIAGNOSIS — W888XXS Exposure to other ionizing radiation, sequela: Secondary | ICD-10-CM

## 2024-11-17 NOTE — Progress Notes (Signed)
 "              Autumn Conley    [id number]    1945/12/19  Primary Care Physician:Osborne, Tari DASEN, PA-C  Referring Physician: Tammy Tari DASEN, PA-C [address] [place],  [place] [address]  Chief complaint:  Follow-up for  Dyspnea, chronic respiratory failure Restrictive lung disease Recurrent left pleural effusion Postradiation fibrosis SARD ILD OSA  HPI: 79 y.o. who  has a past medical history of Anemia, Anxiety, Breast cancer (HCC), Depression, Dyspnea, Fibromyalgia, GERD (gastroesophageal reflux disease), Glaucoma of both eyes, Headache, Hypertension, IBS (irritable bowel syndrome), Mitral valve prolapse, NASH (nonalcoholic steatohepatitis), Osteoarthritis, PONV (postoperative nausea and vomiting), Stroke (HCC), and 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(HCC).  Discussed the use of AI scribe software for clinical note transcription with the patient, who gave verbal consent to proceed.  History of Present Illness Autumn Conley is a 79 year old female with malignant thymoma who presents with progressive shortness of breath, recurrent pleural effusion. She is accompanied by her husband, [name].   Recurrent pleural effusion, dyspnea, restrictive lung disease - Felt to have restrictive lung disease due to combination of postradiation fibrosis, malignant thymoma affecting chest wall cavity, pleural effusion and likely SARD-ILD - Positive D-dimer and underwent CTA September 2025 which showed increasing left effusion, no PE.  She underwent thoracentesis on 07/04/2024 with improvement in dyspnea. Approximately 500 cc of bloody fluid removed during drainage. - Repeat thoracentesis in October 2025 with removal of 300 mL of serosanguineous fluid.  Cultures negative.  No malignant cells on cytology.  Autoimmune disease - History of autoimmune disease with positive ANA, SSA, rheumatoid factor - Has sicca symptoms with dry mouth, dry - Referred to rheumatologist in September 2025 but patient did not want to  follow through with that as she cannot get additional immunosuppression as she is already on chemotherapy for malignancy  Malignant thymoma and oncologic history - Diagnosed with malignant thymoma, receiving chemotherapy for nearly five years - Underwent radiation therapy approximately around 2022 - Has received three different types of chemotherapy and continues active treatment at [facility].  Currently on Altima - History of pericardial window procedure in 2022 during hospitalization at [facility] for pericardial effusion related to thymoma  Breast cancer history - History of breast cancer over 20 years ago - Treated with mastectomy and five-year course of oral medication - No history of radiation therapy for breast cancer  Sleep-disordered breathing - Uses CPAP machine at night - Significant improvement in sleep quality with CPAP  Cardiac history - History of tachycardia, previously managed with metoprolol  and amlodipine - Pericardial window procedure for pericardial effusion in 2022  CKD, Renal function abnormalities - History of elevated creatinine levels, occasionally resulting in delayed chemotherapy treatments  Interim history Autumn Conley is a 79 year old female with thymoma and restrictive lung disease who presents with worsening shortness of breath.  Dyspnea and functional decline - Acutely worsening shortness of breath over the past 2 weeks, especially with exertion - Describes gasping for air and limitation of basic activities, including walking to the bathroom - Similar prior episodes improved after pleural fluid removal in September and October 2025 (500 cc and 300 cc drained, respectively) - Rapid decline in respiratory status reported by family - Breathing worsened soon after physical therapy ended - Restarted portable oxygen  due to increased shortness of breath, which she had previously discontinued  Thymoma and oncologic treatment - Thymoma treated with  radiation therapy - Currently receiving Alimta chemotherapy every  six weeks  Restrictive lung disease and sjogren's syndrome - Possible Sjogren-related restrictive lung disease - Improved functional capacity with physical therapy, previously able to walk independently at home  Relevant Pulmonary history: Pets: Cat Occupation: Used to work in presenter, broadcasting Exposures: No mold, hot tub, Financial Controller.  No feather pillows or comforter No h/o chemo/XRT/amiodarone/macrodantin /MTX  No exposure to asbestos, silica or other organic allergens  Smoking history: Never smoker Travel history: No significant travel history Family history: History of lung cancer in the family.  Outpatient Encounter Medications as of 11/17/2024  Medication Sig   acetaminophen  (TYLENOL ) 650 MG CR tablet Take 650 mg by mouth every 8 (eight) hours as needed for pain.   albuterol  (VENTOLIN  HFA) 108 (90 Base) MCG/ACT inhaler Inhale 1-2 puffs into the lungs every 6 (six) hours as needed for wheezing or shortness of breath.   ALPRAZolam  (XANAX ) 0.25 MG tablet Take 0.5 tablets (0.125 mg total) by mouth daily as needed for anxiety.   amLODipine (NORVASC) 5 MG tablet Take 5 mg by mouth daily.   Artificial Tears ophthalmic solution Place 1 drop into both eyes 4 (four) times daily as needed (DRY EYES). EVOH TEARS   atorvastatin  (LIPITOR) 40 MG tablet TAKE 1 TABLET BY MOUTH AT  BEDTIME   B-D 3CC LUER-LOK SYR 25GX1 25G X 1 3 ML MISC USE TO INJECT  CYANOCOBALAMIN  ONCE MONTHLY   butalbital -acetaminophen -caffeine  (FIORICET ) 50-325-40 MG tablet TAKE 1 TO 2 TABLETS BY MOUTH EVERY 6 HOURS AS NEEDED FOR HEADACHES(S). MANUFACTURER RECOMMENDS NOT EXCEEDING 6 TABLETS PER DAY.   Cholecalciferol  (VITAMIN D -3 PO) Take 1 capsule by mouth daily.   dexamethasone  (DECADRON ) 1 MG tablet Take 1 mg by mouth daily.   diphenhydrAMINE  HCl (BENADRYL  PO) Take 1 tablet by mouth as needed.   docusate sodium  (COLACE) 100 MG capsule Take 100 mg by mouth 2 (two)  times daily as needed for mild constipation or moderate constipation.   folic acid  (FOLVITE ) 1 MG tablet Take 1 mg by mouth daily.   metoprolol  succinate (TOPROL  XL) 50 MG 24 hr tablet Take 1 tablet (50 mg total) by mouth daily.   mupirocin  ointment (BACTROBAN ) 2 % Apply to nasal area 1-2 times daily   pantoprazole  (PROTONIX ) 40 MG tablet Take 1 tablet (40 mg total) by mouth daily before breakfast.   PARoxetine  (PAXIL ) 20 MG tablet TAKE 3 TABLETS BY MOUTH IN THE  MORNING   Polyethyl Glycol-Propyl Glycol (SYSTANE OP) Apply 1 drop to eye as needed. about 4-5 times daily   polyethylene glycol powder (GLYCOLAX /MIRALAX ) 17 GM/SCOOP powder Take 17 g by mouth daily as needed for mild constipation.   valACYclovir  (VALTREX ) 500 MG tablet TAKE 1 TABLET BY MOUTH DAILY   vitamin E  180 MG (400 UNITS) capsule Take 400 Units by mouth daily.   No facility-administered encounter medications on file as of 11/17/2024.    Physical Exam: Today's Vitals   11/17/24 1125  BP: 113/67  Pulse: 69  Temp: (!) 97.4 F (36.3 C)  TempSrc: Oral  SpO2: 100%  Weight: 128 lb (58.1 kg)  Height: 5' 3 (1.6 m)    Body mass index is 22.67 kg/m.  Physical Exam GEN: No acute distress CV: Regular rate and rhythm no murmurs LUNGS: Clear to auscultation bilaterally normal respiratory effort SKIN JOINTS: Warm and dry no rash  Data Reviewed: Imaging: CTA 10/17/2023-no evidence of pulmonary embolism, extensive pleural thickening, small bilateral loculated effusion, soft tissue fullness in the right hemithorax, chronic architectural distortion, masslike opacities at the lower  lobes  CT chest abdomen pelvis report from [facility] 04/26/2024 1.  Increased size of the small loculated left pleural effusion. No new pleural nodularity, however, evaluation is limited in absence of IV contrast.  2.  Stable right breast masses and post radiation fibrosis in the right middle lobe.  3.  Previously described nodule in the ascending  colon is not seen on this noncontrast study. Again, recommend colonoscopy when clinically feasible.  4.  Unchanged size of the indeterminate right interpolar renal lesion.   PET scan from [facility] 06/07/2024 1.  Decreased soft tissue thickening and decreased avidity (though still measuring slightly above liver) along the right breast/chest wall, favored posttreatment related changes. However attention follow-up is advised.  2.  No new FDG avid lymphadenopathy or distant metastatic disease.  3.  Similar diffuse low-level FDG uptake associated with the enlarged moderate left pleural effusion.   CTA 06/28/2024-no pulmonary embolism, chronic right chest wall tissue density extending into the middle lobe, right mediastinum, chronic right effusion, progression of left pleural effusion.  Generalized pleural thickening I have reviewed the images personally  PFTs: 04/06/2024 FVC 1.36 [52%], FEV1 1.15 [60%], F/F85, DLCO 6.02 [32%] Moderate restriction, severe diffusion defect  Labs: CTD serology 05/01/2020-ANA 1: 80, nuclear speckled CTD serologies 06/01/2014-ANA 1: 320, cytoplasmic, rheumatoid factor-16, SSA greater than 8  Pleural studies 07/04/2024 LDH 478, total protein 3.5  Cardiac: Echocardiogram 12/17/2023 LVEF 60 to 65%, mild LVH with grade 1 diastolic dysfunction.  RV systolic function is normal, SSA greater than 8 Assessment & Plan Recurrent left pleural effusion Recurrent left pleural effusion with increased dyspnea. Previous thoracentesis x 2 provided symptomatic relief. Current imaging shows fluid accumulation, but not significantly more than previous episodes. Differential includes malignant effusion from thymoma, but cytology has been negative. Accumulation is slow, and malignancy is uncertain. - Ordered thoracentesis to drain pleural fluid. - Sent drained fluid for cytological analysis. - Ordered chest CT to evaluate lung condition post-drainage. - Will consider pleurX catheter  placement if effusion recurs and is confirmed malignant.  Multifactorial shortness of breath Chronic hypoxic respiratory failure Shortness of breath is likely multifactorial, involving restrictive lung disease, pleural effusion, and potential Sjogren's syndrome ILD. No evidence of pulmonary embolism on recent CT scan. Symptomatic relief with albuterol  inhaler and supplemental oxygen  is advised. - Advise use of albuterol  inhaler for symptomatic relief during episodes of shortness of breath - Continue supplemental oxygen  therapy - Use Boost oxygen  cans as needed for additional oxygen  support  Restrictive lung disease with postradiation fibrosis Possible Sjogren's syndrome with possible interstitial lung disease Sjogren's syndrome with possible interstitial lung disease contributing to respiratory symptoms. Previous rheumatology referral made, but no follow-up due to ongoing chemotherapy. Current treatment with chemotherapy limits additional immunosuppressive therapy. -Patient canceled rheumatology consult as she cannot get additional immunosuppression due to ongoing chemotherapy.  Restrictive lung disease likely secondary to postradiation fibrosis and possible SARD ILD. Symptoms include dyspnea and decreased exercise tolerance. Current management includes chemotherapy for thymoma, which may contribute to lung changes.  - Continue current chemotherapy regimen for thymoma.  Obstructive sleep apnea on CPAP Obstructive sleep apnea managed with CPAP.   Recommendations: Repeat thoracentesis.  Recent pleural studies including cytology Follow-up CT scan Supplemental oxygen  Continue CPAP  I personally spent a total of 45 minutes in the care of the patient today including preparing to see the patient, getting/reviewing separately obtained history, counseling and educating, placing orders, documenting clinical information in the EHR, independently interpreting results, communicating results, and  coordinating  care.   Deysy Schabel MD [facility] 11/17/2024, 11:16 AM  CC: Tammy Tari DASEN, PA-C  "

## 2024-11-17 NOTE — Patient Instructions (Signed)
" °  VISIT SUMMARY: Autumn Conley, a 79 year old female with thymoma and restrictive lung disease, presented with worsening shortness of breath. She has a history of recurrent pleural effusions and is currently undergoing chemotherapy for thymoma. Recent imaging shows fluid accumulation in the pleural space, and she has been experiencing significant dyspnea and functional decline.  YOUR PLAN: RECURRENT LEFT PLEURAL EFFUSION: You have fluid accumulation in your left lung, which is causing increased shortness of breath. -We will refer to interventional radiology to perform a thoracentesis to drain the pleural fluid. -The drained fluid will be sent for cytological analysis to check for malignant cells. -A chest CT will be ordered to evaluate your lung condition after the fluid is drained. -If the effusion recurs and is confirmed malignant, we will consider placing a pleurX catheter.  RESTRICTIVE LUNG DISEASE WITH POSTRADIATION FIBROSIS AND SARD ILD: Your lung disease is likely due to scarring from radiation therapy and possibly related to Sjogren's syndrome. -Continue your current chemotherapy regimen for thymoma.  CHRONIC RESPIRATORY FAILURE: Your chronic respiratory failure is worsened by the pleural effusion and restrictive lung disease, leading to significant shortness of breath. -Continue using supplemental oxygen  therapy as needed.    Contains text generated by [employer].   "

## 2024-11-18 ENCOUNTER — Other Ambulatory Visit: Payer: Self-pay | Admitting: Pulmonary Disease

## 2024-11-18 ENCOUNTER — Ambulatory Visit (HOSPITAL_COMMUNITY)
Admission: RE | Admit: 2024-11-18 | Discharge: 2024-11-18 | Disposition: A | Source: Ambulatory Visit | Attending: Pulmonary Disease

## 2024-11-18 ENCOUNTER — Ambulatory Visit: Admitting: Internal Medicine

## 2024-11-18 DIAGNOSIS — R0600 Dyspnea, unspecified: Secondary | ICD-10-CM

## 2024-11-18 DIAGNOSIS — J90 Pleural effusion, not elsewhere classified: Secondary | ICD-10-CM | POA: Diagnosis present

## 2024-11-18 DIAGNOSIS — J849 Interstitial pulmonary disease, unspecified: Secondary | ICD-10-CM | POA: Diagnosis not present

## 2024-11-18 DIAGNOSIS — M3500 Sicca syndrome, unspecified: Secondary | ICD-10-CM

## 2024-11-18 LAB — BODY FLUID CELL COUNT WITH DIFFERENTIAL
Eos, Fluid: 0 %
Lymphs, Fluid: 11 %
Monocyte-Macrophage-Serous Fluid: 2 % — ABNORMAL LOW (ref 50–90)
Neutrophil Count, Fluid: 87 % — ABNORMAL HIGH (ref 0–25)
Total Nucleated Cell Count, Fluid: 466 uL (ref 0–1000)

## 2024-11-18 MED ORDER — LIDOCAINE-EPINEPHRINE 1 %-1:100000 IJ SOLN
20.0000 mL | Freq: Once | INTRAMUSCULAR | Status: AC
  Administered 2024-11-18: 15 mL

## 2024-11-18 MED ORDER — LIDOCAINE-EPINEPHRINE 1 %-1:100000 IJ SOLN
INTRAMUSCULAR | Status: AC
  Filled 2024-11-18: qty 20

## 2024-11-18 NOTE — Procedures (Addendum)
 PROCEDURE SUMMARY:  Successful image-guided diagnostic and therapeutic thoracentesis from the left chest.  The fluid appeared loculated on US  but largest loculation was able to be accessed.  Limited US  of right chest done per patient request. Reassured her that no significant effusion was seen on the right.  Yielded 75 milliliters of serosanguinous fluid.  No immediate complications.  EBL: zero Patient tolerated well.   Specimen sent for labs.  Post-procedure CXR ordered and reviewed prior to departure from department.   Please see imaging section of Epic for full dictation.  Laquinta Hazell NP 11/18/2024 11:32 AM

## 2024-11-20 LAB — PROTEIN, BODY FLUID (OTHER): Total Protein, Body Fluid Other: 3.9 g/dL

## 2024-11-20 LAB — GLUCOSE, BODY FLUID OTHER: Glucose, Body Fluid Other: 60 mg/dL

## 2024-11-20 LAB — LD, BODY FLUID (OTHER): LD, Body Fluid: 323 [IU]/L

## 2024-11-21 LAB — CYTOLOGY - NON PAP

## 2024-11-21 LAB — ACID FAST SMEAR (AFB, MYCOBACTERIA): Acid Fast Smear: NEGATIVE

## 2024-11-21 LAB — BODY FLUID CULTURE W GRAM STAIN
Culture: NO GROWTH
Gram Stain: NONE SEEN

## 2024-11-23 LAB — FUNGUS CULTURE RESULT

## 2024-11-23 LAB — FUNGUS CULTURE WITH STAIN

## 2024-12-01 ENCOUNTER — Other Ambulatory Visit

## 2024-12-07 ENCOUNTER — Ambulatory Visit: Admitting: Pulmonary Disease

## 2025-01-05 ENCOUNTER — Ambulatory Visit (HOSPITAL_COMMUNITY)

## 2025-01-10 ENCOUNTER — Ambulatory Visit: Admitting: Internal Medicine

## 2025-02-15 ENCOUNTER — Ambulatory Visit: Admitting: Pulmonary Disease
